# Patient Record
Sex: Female | Born: 1935 | Race: Black or African American | Hispanic: No | Smoking: Former smoker
Health system: Southern US, Community
[De-identification: ages and names within clinical notes are randomized; demographics above are authoritative.]

## PROBLEM LIST (undated history)

## (undated) DIAGNOSIS — N10 Acute tubulo-interstitial nephritis: Secondary | ICD-10-CM

## (undated) DIAGNOSIS — D649 Anemia, unspecified: Secondary | ICD-10-CM

## (undated) DIAGNOSIS — N3281 Overactive bladder: Secondary | ICD-10-CM

## (undated) DIAGNOSIS — K219 Gastro-esophageal reflux disease without esophagitis: Secondary | ICD-10-CM

## (undated) DIAGNOSIS — Z1612 Extended spectrum beta lactamase (ESBL) resistance: Secondary | ICD-10-CM

## (undated) DIAGNOSIS — G8220 Paraplegia, unspecified: Secondary | ICD-10-CM

## (undated) DIAGNOSIS — H40053 Ocular hypertension, bilateral: Secondary | ICD-10-CM

## (undated) DIAGNOSIS — Z96651 Presence of right artificial knee joint: Secondary | ICD-10-CM

## (undated) DIAGNOSIS — A499 Bacterial infection, unspecified: Secondary | ICD-10-CM

## (undated) DIAGNOSIS — M24562 Contracture, left knee: Secondary | ICD-10-CM

## (undated) DIAGNOSIS — E876 Hypokalemia: Secondary | ICD-10-CM

## (undated) DIAGNOSIS — I10 Essential (primary) hypertension: Secondary | ICD-10-CM

## (undated) DIAGNOSIS — R3915 Urgency of urination: Secondary | ICD-10-CM

## (undated) DIAGNOSIS — H409 Unspecified glaucoma: Secondary | ICD-10-CM

## (undated) DIAGNOSIS — R41841 Cognitive communication deficit: Secondary | ICD-10-CM

## (undated) DIAGNOSIS — R293 Abnormal posture: Secondary | ICD-10-CM

## (undated) DIAGNOSIS — Z741 Need for assistance with personal care: Secondary | ICD-10-CM

## (undated) DIAGNOSIS — A4150 Gram-negative sepsis, unspecified: Secondary | ICD-10-CM

## (undated) DIAGNOSIS — E7800 Pure hypercholesterolemia, unspecified: Secondary | ICD-10-CM

## (undated) DIAGNOSIS — F0390 Unspecified dementia without behavioral disturbance: Secondary | ICD-10-CM

## (undated) DIAGNOSIS — Z993 Dependence on wheelchair: Secondary | ICD-10-CM

## (undated) DIAGNOSIS — R262 Difficulty in walking, not elsewhere classified: Secondary | ICD-10-CM

## (undated) DIAGNOSIS — M6281 Muscle weakness (generalized): Secondary | ICD-10-CM

## (undated) DIAGNOSIS — M1711 Unilateral primary osteoarthritis, right knee: Secondary | ICD-10-CM

## (undated) DIAGNOSIS — E119 Type 2 diabetes mellitus without complications: Secondary | ICD-10-CM

## (undated) HISTORY — DX: Cognitive communication deficit: R41.841

## (undated) HISTORY — DX: Difficulty in walking, not elsewhere classified: R26.2

## (undated) HISTORY — DX: Contracture, left knee: M24.562

## (undated) HISTORY — DX: Extended spectrum beta lactamase (ESBL) resistance: Z16.12

## (undated) HISTORY — DX: Ocular hypertension, bilateral: H40.053

## (undated) HISTORY — DX: Unilateral primary osteoarthritis, right knee: M17.11

## (undated) HISTORY — DX: Need for assistance with personal care: Z74.1

## (undated) HISTORY — DX: Gastro-esophageal reflux disease without esophagitis: K21.9

## (undated) HISTORY — PX: ABDOMINAL HYSTERECTOMY: SHX81

## (undated) HISTORY — DX: Overactive bladder: N32.81

## (undated) HISTORY — DX: Dependence on wheelchair: Z99.3

## (undated) HISTORY — DX: Muscle weakness (generalized): M62.81

## (undated) HISTORY — DX: Urgency of urination: R39.15

## (undated) HISTORY — PX: CATARACT EXTRACTION: SUR2

## (undated) HISTORY — DX: Gram-negative sepsis, unspecified: A41.50

## (undated) HISTORY — DX: Acute pyelonephritis: N10

## (undated) HISTORY — DX: Unspecified dementia, unspecified severity, without behavioral disturbance, psychotic disturbance, mood disturbance, and anxiety: F03.90

## (undated) HISTORY — DX: Bacterial infection, unspecified: A49.9

## (undated) HISTORY — DX: Abnormal posture: R29.3

## (undated) MED FILL — Iron Sucrose Inj 20 MG/ML (Fe Equiv): INTRAVENOUS | Qty: 15 | Status: AC

---

## 2001-07-26 ENCOUNTER — Emergency Department (HOSPITAL_COMMUNITY): Admission: EM | Admit: 2001-07-26 | Discharge: 2001-07-26 | Payer: Self-pay | Admitting: Internal Medicine

## 2001-07-26 ENCOUNTER — Encounter: Payer: Self-pay | Admitting: Internal Medicine

## 2001-08-19 ENCOUNTER — Encounter: Payer: Self-pay | Admitting: Preventative Medicine

## 2001-08-19 ENCOUNTER — Ambulatory Visit (HOSPITAL_COMMUNITY): Admission: RE | Admit: 2001-08-19 | Discharge: 2001-08-19 | Payer: Self-pay | Admitting: Preventative Medicine

## 2001-10-17 ENCOUNTER — Ambulatory Visit (HOSPITAL_COMMUNITY): Admission: RE | Admit: 2001-10-17 | Discharge: 2001-10-17 | Payer: Self-pay | Admitting: Preventative Medicine

## 2001-10-17 ENCOUNTER — Encounter: Payer: Self-pay | Admitting: Preventative Medicine

## 2002-04-23 ENCOUNTER — Ambulatory Visit (HOSPITAL_COMMUNITY): Admission: RE | Admit: 2002-04-23 | Discharge: 2002-04-23 | Payer: Self-pay | Admitting: General Surgery

## 2002-04-23 ENCOUNTER — Encounter: Payer: Self-pay | Admitting: General Surgery

## 2002-08-27 ENCOUNTER — Ambulatory Visit (HOSPITAL_COMMUNITY): Admission: RE | Admit: 2002-08-27 | Discharge: 2002-08-27 | Payer: Self-pay | Admitting: General Surgery

## 2002-08-27 ENCOUNTER — Encounter: Payer: Self-pay | Admitting: General Surgery

## 2002-09-03 ENCOUNTER — Ambulatory Visit (HOSPITAL_COMMUNITY): Admission: RE | Admit: 2002-09-03 | Discharge: 2002-09-03 | Payer: Self-pay | Admitting: General Surgery

## 2002-09-03 ENCOUNTER — Encounter: Payer: Self-pay | Admitting: General Surgery

## 2002-09-17 ENCOUNTER — Ambulatory Visit (HOSPITAL_COMMUNITY): Admission: RE | Admit: 2002-09-17 | Discharge: 2002-09-17 | Payer: Self-pay | Admitting: General Surgery

## 2002-09-17 ENCOUNTER — Encounter: Payer: Self-pay | Admitting: General Surgery

## 2003-09-01 ENCOUNTER — Ambulatory Visit (HOSPITAL_COMMUNITY): Admission: RE | Admit: 2003-09-01 | Discharge: 2003-09-01 | Payer: Self-pay | Admitting: General Surgery

## 2003-09-11 ENCOUNTER — Ambulatory Visit (HOSPITAL_COMMUNITY): Admission: RE | Admit: 2003-09-11 | Discharge: 2003-09-11 | Payer: Self-pay | Admitting: General Surgery

## 2005-01-25 ENCOUNTER — Ambulatory Visit (HOSPITAL_COMMUNITY): Admission: RE | Admit: 2005-01-25 | Discharge: 2005-01-25 | Payer: Self-pay | Admitting: General Surgery

## 2005-02-15 ENCOUNTER — Ambulatory Visit (HOSPITAL_COMMUNITY): Admission: RE | Admit: 2005-02-15 | Discharge: 2005-02-15 | Payer: Self-pay | Admitting: General Surgery

## 2005-05-31 ENCOUNTER — Ambulatory Visit (HOSPITAL_COMMUNITY): Admission: RE | Admit: 2005-05-31 | Discharge: 2005-05-31 | Payer: Self-pay | Admitting: General Surgery

## 2006-03-20 ENCOUNTER — Emergency Department (HOSPITAL_COMMUNITY): Admission: EM | Admit: 2006-03-20 | Discharge: 2006-03-20 | Payer: Self-pay | Admitting: Emergency Medicine

## 2006-03-21 ENCOUNTER — Ambulatory Visit (HOSPITAL_COMMUNITY): Admission: RE | Admit: 2006-03-21 | Discharge: 2006-03-21 | Payer: Self-pay | Admitting: General Surgery

## 2007-04-08 ENCOUNTER — Ambulatory Visit (HOSPITAL_COMMUNITY): Admission: RE | Admit: 2007-04-08 | Discharge: 2007-04-08 | Payer: Self-pay | Admitting: General Surgery

## 2008-03-12 ENCOUNTER — Emergency Department (HOSPITAL_COMMUNITY): Admission: EM | Admit: 2008-03-12 | Discharge: 2008-03-12 | Payer: Self-pay | Admitting: Emergency Medicine

## 2008-04-23 ENCOUNTER — Ambulatory Visit (HOSPITAL_COMMUNITY): Admission: RE | Admit: 2008-04-23 | Discharge: 2008-04-23 | Payer: Self-pay | Admitting: General Surgery

## 2009-05-05 ENCOUNTER — Ambulatory Visit (HOSPITAL_COMMUNITY): Admission: RE | Admit: 2009-05-05 | Discharge: 2009-05-05 | Payer: Self-pay | Admitting: General Surgery

## 2009-12-01 ENCOUNTER — Ambulatory Visit (HOSPITAL_COMMUNITY): Admission: RE | Admit: 2009-12-01 | Discharge: 2009-12-01 | Payer: Self-pay | Admitting: Family Medicine

## 2010-05-23 ENCOUNTER — Ambulatory Visit (HOSPITAL_COMMUNITY): Admission: RE | Admit: 2010-05-23 | Discharge: 2010-05-23 | Payer: Self-pay | Admitting: General Surgery

## 2010-08-14 ENCOUNTER — Encounter: Payer: Self-pay | Admitting: General Surgery

## 2010-12-09 NOTE — Op Note (Signed)
   NAME:  ABYGALE, KARPF                          ACCOUNT NO.:  [id number]   MEDICAL RECORD NO.:  [id number]                   PATIENT TYPE:  AMB   LOCATION:  DAY                                  FACILITY:  [facility]   PHYSICIAN:  Barbaraann Barthel, M.D.              DATE OF BIRTH:  03-Jul-1935   DATE OF PROCEDURE:  09/17/2002  DATE OF DISCHARGE:                                 OPERATIVE REPORT   PREOPERATIVE DIAGNOSIS:  Abnormal left mammogram.   PROCEDURE:  Needle localization with left partial mastectomy.   SURGEON:  Barbaraann Barthel, M.D.   SPECIMENS:  Left breast tissue.   CLINICAL NOTE:  This is a 76 year old black female who had been followed  with serial mammography for an oil cyst in her left breast.  This has  remained stable.  Then she was noted to have a new lesion that appeared  suspicious.  Biopsy was recommended.  Needle localization was planned, and  we discussed the complications not limited to but including bleeding,  infection, and the possibility that further surgery may be required.  Informed consent was obtained.   GROSS OPERATIVE FINDINGS:  Nothing abnormal was encountered  intraoperatively.  Specimen mammography revealed that the suspicious lesion  was included in the specimen.  Final pathology is pending.   DESCRIPTION OF PROCEDURE:  The patient's left hemithorax was prepped with  Betadine solution and draped in the usual manner.  An elliptical incision  was carried out around the wire, removing a piece of skin in order not to  dislodge the localizing wire.  We removed this in toto.  The specimen, as  previously discussed with the radiologist, was approximately 5 cm or so  below the skin margin.  We removed a generous portion of tissue around the  wire and this was sent for specimen mammography, which confirmed that we had  removed the lesion.  The wound was then irrigated with normal saline  solution.  The bleeding was controlled with the cautery device.  The  breast  tissue was approximated with 3-0 Polysorb and a cosmetic subcuticular 5-0  Polysorb closure was obtained.  Steri-Strips and Neosporin were further used  with a sterile dressing.  Prior to closure all sponge, needle, and  instrument counts were found to be correct.  Estimated blood loss was  minimal.  The patient received 900 mL of crystalloids intraoperatively.  No  drains were placed.  There were no complications.                                               Barbaraann Barthel, M.D.    WB/MEDQ  D:  09/17/2002  T:  09/17/2002  Job:  [id number]

## 2011-05-09 ENCOUNTER — Other Ambulatory Visit (HOSPITAL_COMMUNITY): Payer: Self-pay | Admitting: General Surgery

## 2011-05-09 DIAGNOSIS — Z139 Encounter for screening, unspecified: Secondary | ICD-10-CM

## 2011-05-26 ENCOUNTER — Ambulatory Visit (HOSPITAL_COMMUNITY): Payer: Medicare HMO

## 2011-05-29 ENCOUNTER — Ambulatory Visit (HOSPITAL_COMMUNITY)
Admission: RE | Admit: 2011-05-29 | Discharge: 2011-05-29 | Disposition: A | Discharge status: Home / Self Care | Payer: Medicare HMO | Source: Ambulatory Visit | Attending: General Surgery | Admitting: General Surgery

## 2011-05-29 DIAGNOSIS — Z139 Encounter for screening, unspecified: Secondary | ICD-10-CM

## 2011-05-29 DIAGNOSIS — Z1231 Encounter for screening mammogram for malignant neoplasm of breast: Secondary | ICD-10-CM | POA: Insufficient documentation

## 2012-04-30 ENCOUNTER — Other Ambulatory Visit (HOSPITAL_COMMUNITY): Payer: Self-pay | Admitting: General Surgery

## 2012-04-30 DIAGNOSIS — Z139 Encounter for screening, unspecified: Secondary | ICD-10-CM

## 2012-05-30 ENCOUNTER — Ambulatory Visit (HOSPITAL_COMMUNITY)
Admission: RE | Admit: 2012-05-30 | Discharge: 2012-05-30 | Disposition: A | Discharge status: Home / Self Care | Payer: Medicare HMO | Source: Ambulatory Visit | Attending: General Surgery | Admitting: General Surgery

## 2012-05-30 ENCOUNTER — Ambulatory Visit (HOSPITAL_COMMUNITY): Payer: Medicare HMO

## 2012-05-30 DIAGNOSIS — Z139 Encounter for screening, unspecified: Secondary | ICD-10-CM

## 2012-05-30 DIAGNOSIS — Z1231 Encounter for screening mammogram for malignant neoplasm of breast: Secondary | ICD-10-CM | POA: Insufficient documentation

## 2012-08-10 ENCOUNTER — Emergency Department (HOSPITAL_COMMUNITY)
Admission: EM | Admit: 2012-08-10 | Discharge: 2012-08-10 | Disposition: A | Discharge status: Home / Self Care | Payer: Medicare HMO | Attending: Emergency Medicine | Admitting: Emergency Medicine

## 2012-08-10 ENCOUNTER — Encounter (HOSPITAL_COMMUNITY): Payer: Self-pay | Admitting: *Deleted

## 2012-08-10 ENCOUNTER — Emergency Department (HOSPITAL_COMMUNITY): Payer: Medicare HMO

## 2012-08-10 DIAGNOSIS — R059 Cough, unspecified: Secondary | ICD-10-CM | POA: Insufficient documentation

## 2012-08-10 DIAGNOSIS — Z862 Personal history of diseases of the blood and blood-forming organs and certain disorders involving the immune mechanism: Secondary | ICD-10-CM | POA: Insufficient documentation

## 2012-08-10 DIAGNOSIS — R05 Cough: Secondary | ICD-10-CM | POA: Insufficient documentation

## 2012-08-10 DIAGNOSIS — E7800 Pure hypercholesterolemia, unspecified: Secondary | ICD-10-CM | POA: Insufficient documentation

## 2012-08-10 DIAGNOSIS — I10 Essential (primary) hypertension: Secondary | ICD-10-CM | POA: Insufficient documentation

## 2012-08-10 DIAGNOSIS — F17200 Nicotine dependence, unspecified, uncomplicated: Secondary | ICD-10-CM | POA: Insufficient documentation

## 2012-08-10 DIAGNOSIS — Z8639 Personal history of other endocrine, nutritional and metabolic disease: Secondary | ICD-10-CM | POA: Insufficient documentation

## 2012-08-10 HISTORY — DX: Essential (primary) hypertension: I10

## 2012-08-10 HISTORY — DX: Pure hypercholesterolemia, unspecified: E78.00

## 2012-08-10 HISTORY — DX: Hypokalemia: E87.6

## 2012-08-10 MED ORDER — HYDROCOD POLST-CHLORPHEN POLST 10-8 MG/5ML PO LQCR: 2.5000 mL | Freq: Two times a day (BID) | ORAL | Status: DC | PRN

## 2012-08-10 MED ORDER — ALBUTEROL SULFATE HFA 108 (90 BASE) MCG/ACT IN AERS: 1.0000 | INHALATION_SPRAY | Freq: Four times a day (QID) | RESPIRATORY_TRACT | Status: DC | PRN

## 2012-08-10 NOTE — ED Notes (Signed)
Pt states cough, productive at times, white in color. Cough x 2 days with decreased appetite.

## 2012-08-10 NOTE — ED Provider Notes (Signed)
History   This chart was scribed for Anna Gaskins, MD by Toya Smothers, ED Scribe. The patient was seen in room [facility]. Patient's care was started at 0903.  CSN: [id number]  Arrival date & time 08/10/12  0903   First MD Initiated Contact with Patient 08/10/12 [phone]      Chief Complaint  Patient presents with  . Cough   Patient is a 78 y.o. female presenting with cough. The history is provided by the EMS personnel and a caregiver. The history is limited by the condition of the patient and the absence of a caregiver. No language interpreter was used.  Cough This is a new problem. The current episode started 2 days ago. The problem has not changed since onset.The cough is non-productive. There has been no fever. Pertinent negatives include no chest pain and no shortness of breath. She has tried nothing for the symptoms. The treatment provided no relief. She is a smoker.   Anna Hanna is a 78 y.o. female who presents to the Emergency Department complaining of 2 days of new, constant, unchanged, moderate productive cough. Typically healthy at baseline  Symptoms have not been treated PTA. No fever, vomiting, bloody cough, abdominal pain, SOB, or leg swelling. Pt denies use of tobacco products, consumption of alcohol, and use of illicit drugs.     Past Medical History  Diagnosis Date  . Hypertension   . Hypercholesteremia   . Hypokalemia     Past Surgical History  Procedure Date  . Cataract extraction   . Abdominal hysterectomy     No family history on file.  History  Substance Use Topics  . Smoking status: Current Some Day Smoker  . Smokeless tobacco: Not on file  . Alcohol Use: No    Review of Systems  Respiratory: Positive for cough. Negative for shortness of breath.   Cardiovascular: Negative for chest pain.  Gastrointestinal: Negative for vomiting.  Neurological: Negative for weakness.  All other systems reviewed and are negative.    Allergies  Review of  patient's allergies indicates no known allergies.  Home Medications   Current Outpatient Rx  Name  Route  Sig  Dispense  Refill  . ALBUTEROL SULFATE HFA 108 (90 BASE) MCG/ACT IN AERS   Inhalation   Inhale 1-2 puffs into the lungs every 6 (six) hours as needed for wheezing.   1 Inhaler   0   . HYDROCOD POLST-CPM POLST ER 10-8 MG/5ML PO LQCR   Oral   Take 2.5 mLs by mouth every 12 (twelve) hours as needed (cough).   115 mL   0     BP 107/71  Pulse 110  Temp 98.7 F (37.1 C) (Oral)  Resp 20  Ht 5\' 1"  (1.549 m)  Wt 208 lb (94.348 kg)  BMI 39.30 kg/m2  SpO2 100%  Physical Exam   CONSTITUTIONAL: Well developed/well nourished HEAD AND FACE: Normocephalic/atraumatic EYES: EOMI/PERRL ENMT: Mucous membranes moist, nasal congestion.  Voice normal NECK: supple no meningeal signs SPINE:entire spine nontender CV: S1/S2 noted, no murmurs/rubs/gallops noted LUNGS: Lungs are clear to auscultation bilaterally, no apparent distress ABDOMEN: soft, nontender, no rebound or guarding GU:no cva tenderness NEURO: Pt is awake/alert, moves all extremitiesx4 EXTREMITIES: pulses normal, full ROM SKIN: warm, color normal PSYCH: no abnormalities of mood noted   ED Course  Procedures DIAGNOSTIC STUDIES: Oxygen Saturation is 100% on room air, normal by my interpretation.    COORDINATION OF CARE: 19:19- Ordered DG Chest 2 View 1 time imaging. 09:35-  Evaluated Pt. Pt is awake, alert, and without distress. 09:38- Patient understand and agree with initial ED impression and plan with expectations set for ED visit. Pt well appearing.  Likely viral process.  No distress noted.  meds given for symptomatic relief.  Advised of sedation with cough meds.  Pt denied h/o glaucoma, however her meds reveal she is on eye drops for glaucoma.  Will call to advise to withold this medicine as can contribute to glaucoma.   Labs Reviewed - No data to display Dg Chest 2 View  08/10/2012  *RADIOLOGY REPORT*   Clinical Data: Cough  CHEST - 2 VIEW  Comparison: 09/01/2003  Findings: Mild hyperinflation. Lateral view degraded by patient arm position.  Mid thoracic spondylosis. Midline trachea.  Normal heart size. Tortuous thoracic aorta. No pleural effusion or pneumothorax.  Mild lower lobe predominant interstitial thickening. Clear lungs.  IMPRESSION: 1.  No acute cardiopulmonary disease. 2.  Mild peribronchial thickening which may relate to chronic bronchitis or smoking.   Original Report Authenticated By: Jeronimo Greaves, M.D.      1. Cough       MDM  Nursing notes including past medical history and social history reviewed and considered in documentation xrays reviewed and considered       I personally performed the services described in this documentation, which was scribed in my presence. The recorded information has been reviewed and is accurate.      Anna Gaskins, MD 08/10/12 [phone]

## 2013-05-03 ENCOUNTER — Other Ambulatory Visit (HOSPITAL_COMMUNITY): Payer: Self-pay | Admitting: General Surgery

## 2013-05-03 DIAGNOSIS — Z0000 Encounter for general adult medical examination without abnormal findings: Secondary | ICD-10-CM

## 2013-05-05 ENCOUNTER — Ambulatory Visit (HOSPITAL_COMMUNITY)
Admission: RE | Admit: 2013-05-05 | Discharge: 2013-05-05 | Disposition: A | Discharge status: Home / Self Care | Payer: Medicare HMO | Source: Ambulatory Visit | Attending: General Surgery | Admitting: General Surgery

## 2013-05-05 DIAGNOSIS — Z0000 Encounter for general adult medical examination without abnormal findings: Secondary | ICD-10-CM

## 2013-05-05 DIAGNOSIS — Z1231 Encounter for screening mammogram for malignant neoplasm of breast: Secondary | ICD-10-CM | POA: Insufficient documentation

## 2013-07-16 ENCOUNTER — Emergency Department (HOSPITAL_COMMUNITY): Payer: Medicare HMO

## 2013-07-16 ENCOUNTER — Emergency Department (HOSPITAL_COMMUNITY)
Admission: EM | Admit: 2013-07-16 | Discharge: 2013-07-16 | Disposition: A | Discharge status: Home / Self Care | Payer: Medicare HMO | Attending: Emergency Medicine | Admitting: Emergency Medicine

## 2013-07-16 ENCOUNTER — Encounter (HOSPITAL_COMMUNITY): Payer: Self-pay | Admitting: Emergency Medicine

## 2013-07-16 DIAGNOSIS — M25551 Pain in right hip: Secondary | ICD-10-CM

## 2013-07-16 DIAGNOSIS — M25561 Pain in right knee: Secondary | ICD-10-CM

## 2013-07-16 DIAGNOSIS — Z862 Personal history of diseases of the blood and blood-forming organs and certain disorders involving the immune mechanism: Secondary | ICD-10-CM | POA: Insufficient documentation

## 2013-07-16 DIAGNOSIS — Z79899 Other long term (current) drug therapy: Secondary | ICD-10-CM | POA: Insufficient documentation

## 2013-07-16 DIAGNOSIS — I10 Essential (primary) hypertension: Secondary | ICD-10-CM | POA: Insufficient documentation

## 2013-07-16 DIAGNOSIS — M25569 Pain in unspecified knee: Secondary | ICD-10-CM | POA: Insufficient documentation

## 2013-07-16 DIAGNOSIS — M25559 Pain in unspecified hip: Secondary | ICD-10-CM | POA: Insufficient documentation

## 2013-07-16 DIAGNOSIS — Z8639 Personal history of other endocrine, nutritional and metabolic disease: Secondary | ICD-10-CM | POA: Insufficient documentation

## 2013-07-16 DIAGNOSIS — F17200 Nicotine dependence, unspecified, uncomplicated: Secondary | ICD-10-CM | POA: Insufficient documentation

## 2013-07-16 MED ORDER — OXYCODONE-ACETAMINOPHEN 5-325 MG PO TABS: 1.0000 | ORAL_TABLET | ORAL | Status: DC | PRN

## 2013-07-16 MED ORDER — ACETAMINOPHEN 500 MG PO TABS
1000.0000 mg | ORAL_TABLET | Freq: Once | ORAL | Status: AC
  Administered 2013-07-16: 1000 mg via ORAL
  Filled 2013-07-16: qty 2

## 2013-07-16 MED ORDER — HYDROMORPHONE HCL PF 1 MG/ML IJ SOLN
0.5000 mg | Freq: Once | INTRAMUSCULAR | Status: AC
  Administered 2013-07-16: 0.5 mg via INTRAMUSCULAR
  Filled 2013-07-16: qty 1

## 2013-07-16 MED ORDER — IBUPROFEN 800 MG PO TABS
800.0000 mg | ORAL_TABLET | Freq: Once | ORAL | Status: AC
  Administered 2013-07-16: 800 mg via ORAL
  Filled 2013-07-16: qty 1

## 2013-07-16 NOTE — ED Notes (Signed)
C/o arthritis pain in right hand yesterday and right leg pain today-pt points to right knee and right groin area

## 2013-07-16 NOTE — ED Notes (Signed)
Dr. Adriana Simas notified temp 100.5

## 2013-07-16 NOTE — ED Provider Notes (Signed)
CSN: [id number]     Arrival date & time 07/16/13  1556 History   First MD Initiated Contact with Patient 07/16/13 1646     Chief Complaint  Patient presents with  . Leg Pain   (Consider location/radiation/quality/duration/timing/severity/associated sxs/prior Treatment) HPI.... complains of pain in right knee and right groin for 24 hours. No fever, sweats, chills, dysuria.   Ambulation makes pain worse.  No known rheumatological disorders. Severity is moderate.  No radiation of pain  Past Medical History  Diagnosis Date  . Hypertension   . Hypercholesteremia   . Hypokalemia    Past Surgical History  Procedure Laterality Date  . Cataract extraction    . Abdominal hysterectomy     History reviewed. No pertinent family history. History  Substance Use Topics  . Smoking status: Current Some Day Smoker    Types: Cigarettes  . Smokeless tobacco: Not on file  . Alcohol Use: No   OB History   Grav Para Term Preterm Abortions TAB SAB Ect Mult Living                 Review of Systems  All other systems reviewed and are negative.    Allergies  Review of patient's allergies indicates no known allergies.  Home Medications   Current Outpatient Rx  Name  Route  Sig  Dispense  Refill  . amLODipine (NORVASC) 5 MG tablet   Oral   Take 1 tablet by mouth Daily.         . brimonidine (ALPHAGAN) 0.15 % ophthalmic solution   Both Eyes   Place 1 drop into both eyes every morning.          . hydrochlorothiazide (MICROZIDE) 12.5 MG capsule   Oral   Take 1 capsule by mouth Daily.         Marland Kitchen LOTEMAX 0.5 % GEL   Both Eyes   Place 1 drop into both eyes every morning.          . potassium chloride SA (K-DUR,KLOR-CON) 20 MEQ tablet   Oral   Take 10 mEq by mouth Daily.          Marland Kitchen latanoprost (XALATAN) 0.005 % ophthalmic solution   Both Eyes   Place 1 drop into both eyes at bedtime.          Marland Kitchen LORazepam (ATIVAN) 1 MG tablet               . oxyCODONE-acetaminophen  (PERCOCET) 5-325 MG per tablet   Oral   Take 1 tablet by mouth every 4 (four) hours as needed.   20 tablet   0   . timolol (TIMOPTIC) 0.5 % ophthalmic solution   Both Eyes   Place 1 drop into both eyes Daily.          BP 153/88  Pulse 126  Temp(Src) 98.8 F (37.1 C) (Oral)  Resp 16  Ht 5\' 4"  (1.626 m)  Wt 218 lb (98.884 kg)  BMI 37.40 kg/m2  SpO2 98% Physical Exam  Nursing note and vitals reviewed. Constitutional: She is oriented to person, place, and time. She appears well-developed and well-nourished.  HENT:  Head: Normocephalic and atraumatic.  Eyes: Conjunctivae and EOM are normal. Pupils are equal, round, and reactive to light.  Neck: Normal range of motion. Neck supple.  Cardiovascular: Normal rate, regular rhythm and normal heart sounds.   Pulmonary/Chest: Effort normal and breath sounds normal.  Abdominal: Soft. Bowel sounds are normal.  Musculoskeletal:  Tender right groin  and anterior right knee. No masses in groin.  Neurological: She is alert and oriented to person, place, and time.  Skin: Skin is warm and dry.  Psychiatric: She has a normal mood and affect. Her behavior is normal.    ED Course  Procedures (including critical care time) Labs Review Labs Reviewed - No data to display Imaging Review Dg Pelvis 1-2 Views  07/16/2013   CLINICAL DATA:  Right leg pain started yesterday  EXAM: PELVIS - 1-2 VIEW  COMPARISON:  05/13/2013 left hip radiographs  FINDINGS: Osseous demineralization.  Mild bilateral narrowing of the hip joints.  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  Mild sclerosis at pubic symphysis.  No acute fracture, dislocation or bone destruction.  Facet degenerative changes lower lumbar spine.  IMPRESSION: Mild hip joint degenerative changes and acetabula protrusio bilaterally.  Osseous demineralization.  Minimal osteitis pubis.   Electronically Signed   By: Ulyses Southward M.D.   On: 07/16/2013 17:47   Dg Knee Complete 4 Views  Right  07/16/2013   CLINICAL DATA:  Right knee pain beginning 1 day ago.  EXAM: RIGHT KNEE - COMPLETE 4+ VIEW  COMPARISON:  Plain films right knee 0 5/0 05/2010.  FINDINGS: No acute bony or joint abnormality is identified. The patient has advanced tricompartmental osteoarthritis, worst in the lateral compartment. Small joint effusion noted.  IMPRESSION: No acute finding. Advanced osteoarthritis, worse lateral compartment.   Electronically Signed   By: Drusilla Kanner M.D.   On: 07/16/2013 17:47    EKG Interpretation   None       MDM   1. Pelvic joint pain, right   2. Right knee pain    X-ray of pelvis and right knee show no fracture.  Patient has primary care followup. Rx Percocet    Donnetta Hutching, MD 07/16/13 [phone]

## 2013-11-26 ENCOUNTER — Telehealth: Payer: Self-pay

## 2013-11-26 NOTE — Telephone Encounter (Signed)
Gastroenterology Pre-Procedure Review  Request Date: 11/26/2013 Requesting Physician: Dr. Malvin JohnsBradford  PATIENT REVIEW QUESTIONS: The patient responded to the following health history questions as indicated:    1. Diabetes Melitis: no 2. Joint replacements in the past 12 months: no 3. Major health problems in the past 3 months: no 4. Has an artificial valve or MVP: no 5. Has a defibrillator: no 6. Has been advised in past to take antibiotics in advance of a procedure like teeth cleaning: no    MEDICATIONS & ALLERGIES:    Patient reports the following regarding taking any blood thinners:   Plavix? no Aspirin? no Coumadin? no  Patient confirms/reports the following medications:  Current Outpatient Prescriptions  Medication Sig Dispense Refill  . amLODipine (NORVASC) 5 MG tablet Take 1 tablet by mouth Daily.      . brimonidine (ALPHAGAN) 0.15 % ophthalmic solution Place 1 drop into both eyes every morning.       . hydrochlorothiazide (MICROZIDE) 12.5 MG capsule Take 1 capsule by mouth Daily.      Marland Kitchen. latanoprost (XALATAN) 0.005 % ophthalmic solution Place 1 drop into both eyes at bedtime.       . potassium chloride SA (K-DUR,KLOR-CON) 20 MEQ tablet Take 20 mEq by mouth Daily.       . timolol (TIMOPTIC) 0.5 % ophthalmic solution Place 1 drop into both eyes Daily.      . vitamin B-12 (CYANOCOBALAMIN) 1000 MCG tablet Take 1,000 mcg by mouth daily.      Marland Kitchen. LORazepam (ATIVAN) 1 MG tablet       . LOTEMAX 0.5 % GEL Place 1 drop into both eyes every morning.       Marland Kitchen. oxyCODONE-acetaminophen (PERCOCET) 5-325 MG per tablet Take 1 tablet by mouth every 4 (four) hours as needed.  20 tablet  0   No current facility-administered medications for this visit.    Patient confirms/reports the following allergies:  No Known Allergies  No orders of the defined types were placed in this encounter.    AUTHORIZATION INFORMATION Primary Insurance:   ID #:   Group #:  Pre-Cert / Auth required:  Pre-Cert /  Auth #:   Secondary Insurance:   ID #:   Group #:  Pre-Cert / Auth required:  Pre-Cert / Auth #:   SCHEDULE INFORMATION: Procedure has been scheduled as follows:  Date: 12/10/2013          Time:  8:30 AM Location: [facility] Short Stay   This Gastroenterology Pre-Precedure Review Form is being routed to the following provider(s): Jonette EvaSandi Fields, MD

## 2013-11-26 NOTE — Telephone Encounter (Signed)
SON HAD MULTIPLE SIMPLE ADENOMAS.  MOVI PREP SPLIT DOSING, FULL LIQUIDS WITH BREAKFAST.  Full Liquid Diet A high-calorie, high-protein supplement should be used to meet your nutritional requirements when the full liquid diet is continued for more than 2 or 3 days. If this diet is to be used for an extended period of time (more than 7 days), a multivitamin should be considered.  Breads and Starches  Allowed: None are allowed except crackers WHOLE OR pureed (made into a thick, smooth soup) in soup.   Avoid: Any others.    Vegetables  Allowed: Strained tomato or vegetable juice. Vegetables pureed in soup.   Avoid: Any others.    Fruit  Allowed: Any strained fruit juices and fruit drinks. Include 1 serving of citrus or vitamin C-enriched fruit juice daily.   Avoid: Any others.  Meat and Meat Substitutes  Allowed: Egg  Avoid: Any meat, fish, or fowl. All cheese.  Milk  Allowed: Milk beverages, including milk shakes and instant breakfast mixes. Smooth yogurt.   Avoid: Any others. Avoid dairy products if not tolerated.    Soups and Combination Foods  Allowed: Broth, strained cream soups. Strained, broth-based soups.   Avoid: Any others.    Desserts and Sweets  Allowed: flavored gelatin, plain ice cream, sherbet, smooth pudding, junket, fruit ices, frozen ice pops, pudding pops,, frozen fudge pops, chocolate syrup. Sugar, honey, jelly, syrup.   Avoid: Any others.  Fats and Oils  Allowed: Margarine, butter, cream, sour cream, oils.   Avoid: Any others.  Beverages  Allowed: All.   Avoid: None.  Condiments  Allowed: Iodized salt, pepper, spices, flavorings. Cocoa powder.   Avoid: Any others.    SAMPLE MEAL PLAN Breakfast   cup orange juice.   MILK SHAKE  1 cup beverage (coffee or tea).   Cream or sugar, if desired.    Midmorning Snack  2 SCRAMBLED OR HARD BOILED EGG   Lunch  1 cup cream soup.    cup fruit juice.   1 cup milk.    cup  custard.   1 cup beverage (coffee or tea).   Cream or sugar, if desired.    Midafternoon Snack  1 cup milk shake.  Dinner  1 cup cream soup.    cup fruit juice.   1 cup milk.    cup pudding.   1 cup beverage (coffee or tea).   Cream or sugar, if desired.  Evening Snack  1 cup supplement.  To increase calories, add sugar, cream, butter, or margarine if possible. Nutritional supplements will also increase the total calories.

## 2013-12-01 ENCOUNTER — Other Ambulatory Visit: Payer: Self-pay

## 2013-12-01 DIAGNOSIS — Z1211 Encounter for screening for malignant neoplasm of colon: Secondary | ICD-10-CM

## 2013-12-01 MED ORDER — PEG-KCL-NACL-NASULF-NA ASC-C 100 G PO SOLR: 1.0000 | ORAL | Status: DC

## 2013-12-01 NOTE — Telephone Encounter (Signed)
Rx sent to the pharmacy and instructions mailed to pt.  

## 2013-12-02 ENCOUNTER — Encounter (HOSPITAL_COMMUNITY): Payer: Self-pay | Admitting: Pharmacy Technician

## 2013-12-04 NOTE — Telephone Encounter (Signed)
Per Leverne Humblesatherine Perry, this PA should be finalized in a day or so.  I informed the call center that I am waiting on this and I will call and let them know when I get it.

## 2013-12-04 NOTE — Telephone Encounter (Signed)
[facility] 12[facility]01/2013 and I have not heard back. Cherly HensenBernadette from [facility] called and I told her that I had sent the request and I would email Leverne Humblesatherine Perry. E-mail to [employer] Perry sent.

## 2013-12-08 NOTE — Telephone Encounter (Signed)
Per Leverne Humblesatherine Perry, Approval number is [id number] and I have informed Bernadett at the Call Center.

## 2013-12-09 NOTE — Telephone Encounter (Signed)
Pt called this morning asking about foods for today ( day of prep).  I told her to get her list of the full liquids and clear liquids and reviewed with her. She is aware to have full liquids before 9:00 Am and the clear liquids after.

## 2013-12-10 ENCOUNTER — Encounter (HOSPITAL_COMMUNITY)
Admission: RE | Disposition: A | Discharge status: Home / Self Care | Payer: Self-pay | Source: Ambulatory Visit | Attending: Gastroenterology

## 2013-12-10 ENCOUNTER — Ambulatory Visit (HOSPITAL_COMMUNITY)
Admission: RE | Admit: 2013-12-10 | Discharge: 2013-12-10 | Disposition: A | Discharge status: Home / Self Care | Payer: Medicare HMO | Source: Ambulatory Visit | Attending: Gastroenterology | Admitting: Gastroenterology

## 2013-12-10 ENCOUNTER — Encounter (HOSPITAL_COMMUNITY): Payer: Self-pay | Admitting: *Deleted

## 2013-12-10 DIAGNOSIS — Q438 Other specified congenital malformations of intestine: Secondary | ICD-10-CM | POA: Insufficient documentation

## 2013-12-10 DIAGNOSIS — Z800 Family history of malignant neoplasm of digestive organs: Secondary | ICD-10-CM | POA: Insufficient documentation

## 2013-12-10 DIAGNOSIS — E7800 Pure hypercholesterolemia, unspecified: Secondary | ICD-10-CM | POA: Insufficient documentation

## 2013-12-10 DIAGNOSIS — Z1211 Encounter for screening for malignant neoplasm of colon: Secondary | ICD-10-CM | POA: Insufficient documentation

## 2013-12-10 DIAGNOSIS — Z79899 Other long term (current) drug therapy: Secondary | ICD-10-CM | POA: Insufficient documentation

## 2013-12-10 DIAGNOSIS — I10 Essential (primary) hypertension: Secondary | ICD-10-CM | POA: Insufficient documentation

## 2013-12-10 DIAGNOSIS — H409 Unspecified glaucoma: Secondary | ICD-10-CM | POA: Insufficient documentation

## 2013-12-10 DIAGNOSIS — E876 Hypokalemia: Secondary | ICD-10-CM | POA: Insufficient documentation

## 2013-12-10 DIAGNOSIS — F17200 Nicotine dependence, unspecified, uncomplicated: Secondary | ICD-10-CM | POA: Insufficient documentation

## 2013-12-10 HISTORY — DX: Unspecified glaucoma: H40.9

## 2013-12-10 HISTORY — PX: COLONOSCOPY: SHX5424

## 2013-12-10 SURGERY — COLONOSCOPY
Anesthesia: Moderate Sedation

## 2013-12-10 MED ORDER — MEPERIDINE HCL 100 MG/ML IJ SOLN
INTRAMUSCULAR | Status: DC | PRN
  Administered 2013-12-10 (×3): 25 mg via INTRAVENOUS

## 2013-12-10 MED ORDER — SODIUM CHLORIDE 0.9 % IV SOLN
INTRAVENOUS | Status: DC
  Administered 2013-12-10: 08:00:00 via INTRAVENOUS

## 2013-12-10 MED ORDER — STERILE WATER FOR IRRIGATION IR SOLN
Status: DC | PRN
  Administered 2013-12-10: 09:00:00

## 2013-12-10 MED ORDER — MIDAZOLAM HCL 5 MG/5ML IJ SOLN
INTRAMUSCULAR | Status: AC
  Filled 2013-12-10: qty 10

## 2013-12-10 MED ORDER — MEPERIDINE HCL 100 MG/ML IJ SOLN
INTRAMUSCULAR | Status: AC
  Filled 2013-12-10: qty 2

## 2013-12-10 MED ORDER — MIDAZOLAM HCL 5 MG/5ML IJ SOLN
INTRAMUSCULAR | Status: DC | PRN
  Administered 2013-12-10 (×3): 1 mg via INTRAVENOUS
  Administered 2013-12-10: 2 mg via INTRAVENOUS

## 2013-12-10 NOTE — Op Note (Signed)
[facility] [address] [place], [id number]   COLONOSCOPY PROCEDURE REPORT  PATIENT: Anna Hanna, Anna J.  MR#: [id number] BIRTHDATE: 1935/01/21 , 79  yrs. old GENDER: Female ENDOSCOPIST: Jonette EvaSandi Teneil Shiller, MD REFERRED XB:JYNWGBY:Steve Sudie BaileyKnowlton, M.D. PROCEDURE DATE:  12/10/2013 PROCEDURE:   Colonoscopy, screening  WITH ENDOCUFF INDICATIONS:Average risk patient for colon cancer. MEDICATIONS: Demerol 75 mg IV and Versed 6 mg IV  DESCRIPTION OF PROCEDURE:    Physical exam was performed.  Informed consent was obtained from the patient after explaining the benefits, risks, and alternatives to procedure.  The patient was connected to monitor and placed in left lateral position. Continuous oxygen was provided by nasal cannula and IV medicine administered through an indwelling cannula.  After administration of sedation and rectal exam, the patients rectum was intubated and the EC-3890Li ([id number]  colonoscope was advanced under direct visualization to the cecum.  The scope was removed slowly by carefully examining the color, texture, anatomy, and integrity mucosa on the way out.  The patient was recovered in endoscopy and discharged home in satisfactory condition.    COLON FINDINGS: The LEFT RECTOSIGMOID colon IS redundant.  The patient was moved on to their back to reach the cecum, The colon mucosa was otherwise normal.  NORMAL RECTUM.  PREP QUALITY: excellent.  CECAL W/D TIME: 11 minutes  COMPLICATIONS: None  ENDOSCOPIC IMPRESSION: 1.   The colon Is redundant 2.   NORMAL RECTUM   RECOMMENDATIONS: HIGH FIBER DIET TCS IN 15 YEARS IF THE BENEFITS OUTWEIGH THE RISKS       _______________________________ eSignedJonette Eva:  Idil Maslanka, MD 12/10/2013 9:24 AM

## 2013-12-10 NOTE — H&P (Signed)
  Primary Care Physician:  Robert Bellow, MD Primary Gastroenterologist:  Dr. Oneida Alar  Pre-Procedure History & Physical: HPI:  Anna Hanna is a 79 y.o. female here for [place].   Past Medical History  Diagnosis Date  . Hypertension   . Hypercholesteremia   . Hypokalemia   . Glaucoma     Past Surgical History  Procedure Laterality Date  . Cataract extraction    . Abdominal hysterectomy      Prior to Admission medications   Medication Sig Start Date End Date Taking? Authorizing Provider  amLODipine (NORVASC) 5 MG tablet Take 1 tablet by mouth Daily. 05/24/12  Yes Historical Provider, MD  brimonidine (ALPHAGAN) 0.15 % ophthalmic solution Place 1 drop into both eyes every morning.  05/07/12  Yes Historical Provider, MD  hydrochlorothiazide (MICROZIDE) 12.5 MG capsule Take 1 capsule by mouth Daily. 05/24/12  Yes Historical Provider, MD  latanoprost (XALATAN) 0.005 % ophthalmic solution Place 1 drop into both eyes at bedtime.  07/14/13  Yes Historical Provider, MD  LOTEMAX 0.5 % GEL Place 1 drop into both eyes every morning.  05/29/12  Yes Historical Provider, MD  peg 3350 powder (MOVIPREP) 100 G SOLR Take 1 kit (200 g total) by mouth as directed. 12/01/13  Yes Danie Binder, MD  potassium chloride SA (K-DUR,KLOR-CON) 20 MEQ tablet Take 20 mEq by mouth Daily.  05/24/12  Yes Historical Provider, MD  timolol (TIMOPTIC) 0.5 % ophthalmic solution Place 1 drop into both eyes Daily. 05/07/12  Yes Historical Provider, MD  vitamin B-12 (CYANOCOBALAMIN) 1000 MCG tablet Take 1,000 mcg by mouth daily.   Yes Historical Provider, MD    Allergies as of 12/01/2013  . (No Known Allergies)    Family History  Problem Relation Age of Onset  . Colon cancer Neg Hx     History   Social History  . Marital Status: Divorced    Spouse Name: N/A    Number of Children: N/A  . Years of Education: N/A   Occupational History  . Not on file.   Social History Main Topics  . Smoking  status: Current Some Day Smoker -- 0.25 packs/day for 3 years    Types: Cigarettes  . Smokeless tobacco: Not on file  . Alcohol Use: No  . Drug Use: No  . Sexual Activity: Not on file   Other Topics Concern  . Not on file   Social History Narrative  . No narrative on file    Review of Systems: See HPI, otherwise negative ROS   Physical Exam: BP 125/70  Pulse 78  Temp(Src) 98.2 F (36.8 C) (Oral)  Resp 18  Ht _0  (1.626 m)  Wt 218 lb (98.884 kg)  BMI 37.40 kg/m2  SpO2 96% General:   Alert,  pleasant and cooperative in NAD Head:  Normocephalic and atraumatic. Neck:  Supple; Lungs:  Clear throughout to auscultation.    Heart:  Regular rate and rhythm. Abdomen:  Soft, nontender and nondistended. Normal bowel sounds, without guarding, and without rebound.   Neurologic:  Alert and  oriented x4;  grossly normal neurologically.  Impression/Plan:     SCREENING  Plan:  1. TCS TODAY

## 2013-12-10 NOTE — Discharge Instructions (Signed)
YOU DID NOT HAVE ANY POLYPS.   FOLLOW A HIGH FIBER DIET. AVOID ITEMS THAT CAUSE BLOATING. SEE INFO BELOW.  Next colonoscopy in 15 years IF THE BENEFITS OUTWEIGH THE RISKS.   Colonoscopy Care After Read the instructions outlined below and refer to this sheet in the next week. These discharge instructions provide you with general information on caring for yourself after you leave the hospital. While your treatment has been planned according to the most current medical practices available, unavoidable complications occasionally occur. If you have any problems or questions after discharge, call DR. Caprice Wasko, [phone].  ACTIVITY  You may resume your regular activity, but move at a slower pace for the next 24 hours.   Take frequent rest periods for the next 24 hours.   Walking will help get rid of the air and reduce the bloated feeling in your belly (abdomen).   No driving for 24 hours (because of the medicine (anesthesia) used during the test).   You may shower.   Do not sign any important legal documents or operate any machinery for 24 hours (because of the anesthesia used during the test).    NUTRITION  Drink plenty of fluids.   You may resume your normal diet as instructed by your doctor.   Begin with a light meal and progress to your normal diet. Heavy or fried foods are harder to digest and may make you feel sick to your stomach (nauseated).   Avoid alcoholic beverages for 24 hours or as instructed.    MEDICATIONS  You may resume your normal medications.   WHAT YOU CAN EXPECT TODAY  Some feelings of bloating in the abdomen.   Passage of more gas than usual.   Spotting of blood in your stool or on the toilet paper  .  IF YOU HAD POLYPS REMOVED DURING THE COLONOSCOPY:  Eat a soft diet IF YOU HAVE NAUSEA, BLOATING, ABDOMINAL PAIN, OR VOMITING.    FINDING OUT THE RESULTS OF YOUR TEST Not all test results are available during your visit. DR. Darrick PennaFIELDS WILL CALL YOU  WITHIN 7 DAYS OF YOUR PROCEDUE WITH YOUR RESULTS. Do not assume everything is normal if you have not heard from DR. Merryn Thaker IN ONE WEEK, CALL HER OFFICE AT [phone].  SEEK IMMEDIATE MEDICAL ATTENTION AND CALL THE OFFICE: [phone] IF:  You have more than a spotting of blood in your stool.   Your belly is swollen (abdominal distention).   You are nauseated or vomiting.   You have a temperature over 101F.   You have abdominal pain or discomfort that is severe or gets worse throughout the day.  High-Fiber Diet A high-fiber diet changes your normal diet to include more whole grains, legumes, fruits, and vegetables. Changes in the diet involve replacing refined carbohydrates with unrefined foods. The calorie level of the diet is essentially unchanged. The Dietary Reference Intake (recommended amount) for adult males is 38 grams per day. For adult females, it is 25 grams per day. Pregnant and lactating women should consume 28 grams of fiber per day. Fiber is the intact part of a plant that is not broken down during digestion. Functional fiber is fiber that has been isolated from the plant to provide a beneficial effect in the body. PURPOSE  Increase stool bulk.   Ease and regulate bowel movements.   Lower cholesterol.  INDICATIONS THAT YOU NEED MORE FIBER  Constipation and hemorrhoids.   Uncomplicated diverticulosis (intestine condition) and irritable bowel syndrome.   Weight management.  As a protective measure against hardening of the arteries (atherosclerosis), diabetes, and cancer.   GUIDELINES FOR INCREASING FIBER IN THE DIET  Start adding fiber to the diet slowly. A gradual increase of about 5 more grams (2 slices of whole-wheat bread, 2 servings of most fruits or vegetables, or 1 bowl of high-fiber cereal) per day is best. Too rapid an increase in fiber may result in constipation, flatulence, and bloating.   Drink enough water and fluids to keep your urine clear or pale  yellow. Water, juice, or caffeine-free drinks are recommended. Not drinking enough fluid may cause constipation.   Eat a variety of high-fiber foods rather than one type of fiber.   Try to increase your intake of fiber through using high-fiber foods rather than fiber pills or supplements that contain small amounts of fiber.   The goal is to change the types of food eaten. Do not supplement your present diet with high-fiber foods, but replace foods in your present diet.  INCLUDE A VARIETY OF FIBER SOURCES  Replace refined and processed grains with whole grains, canned fruits with fresh fruits, and incorporate other fiber sources. White rice, white breads, and most bakery goods contain little or no fiber.   Brown whole-grain rice, buckwheat oats, and many fruits and vegetables are all good sources of fiber. These include: broccoli, Brussels sprouts, cabbage, cauliflower, beets, sweet potatoes, white potatoes (skin on), carrots, tomatoes, eggplant, squash, berries, fresh fruits, and dried fruits.   Cereals appear to be the richest source of fiber. Cereal fiber is found in whole grains and bran. Bran is the fiber-rich outer coat of cereal grain, which is largely removed in refining. In whole-grain cereals, the bran remains. In breakfast cereals, the largest amount of fiber is found in those with "bran" in their names. The fiber content is sometimes indicated on the label.   You may need to include additional fruits and vegetables each day.   In baking, for 1 cup white flour, you may use the following substitutions:   1 cup whole-wheat flour minus 2 tablespoons.   1/2 cup white flour plus 1/2 cup whole-wheat flour.   Hemorrhoids Hemorrhoids are dilated (enlarged) veins around the rectum. Sometimes clots will form in the veins. This makes them swollen and painful. These are called thrombosed hemorrhoids. Causes of hemorrhoids include:  Constipation.   Straining to have a bowel movement.    HEAVY LIFTING HOME CARE INSTRUCTIONS  Eat a well balanced diet and drink 6 to 8 glasses of water every day to avoid constipation. You may also use a bulk laxative.   Avoid straining to have bowel movements.   Keep anal area dry and clean.   Do not use a donut shaped pillow or sit on the toilet for long periods. This increases blood pooling and pain.   Move your bowels when your body has the urge; this will require less straining and will decrease pain and pressure.

## 2013-12-11 ENCOUNTER — Encounter (HOSPITAL_COMMUNITY): Payer: Self-pay | Admitting: Gastroenterology

## 2013-12-11 ENCOUNTER — Telehealth: Payer: Self-pay

## 2013-12-11 NOTE — Telephone Encounter (Signed)
Pt called and said she just wanted us to know that she has not had a BM since colonoscopy yesterday. I told her that is normal, and it might be several days before she has a good BM.  Asked her to call if she has more questions or doesn't have one in the next several days and she said that she would do so.

## 2013-12-16 NOTE — Telephone Encounter (Signed)
REVIEWED. AGREE. 

## 2014-04-20 ENCOUNTER — Other Ambulatory Visit (HOSPITAL_COMMUNITY): Payer: Self-pay | Admitting: General Surgery

## 2014-04-20 DIAGNOSIS — Z1231 Encounter for screening mammogram for malignant neoplasm of breast: Secondary | ICD-10-CM

## 2014-05-07 ENCOUNTER — Ambulatory Visit (HOSPITAL_COMMUNITY)
Admission: RE | Admit: 2014-05-07 | Discharge: 2014-05-07 | Disposition: A | Discharge status: Home / Self Care | Payer: Medicare HMO | Source: Ambulatory Visit | Attending: General Surgery | Admitting: General Surgery

## 2014-05-07 ENCOUNTER — Ambulatory Visit (HOSPITAL_COMMUNITY): Payer: Medicare HMO

## 2014-05-07 DIAGNOSIS — Z1231 Encounter for screening mammogram for malignant neoplasm of breast: Secondary | ICD-10-CM | POA: Diagnosis present

## 2014-11-03 DIAGNOSIS — H10413 Chronic giant papillary conjunctivitis, bilateral: Secondary | ICD-10-CM | POA: Diagnosis not present

## 2014-11-03 DIAGNOSIS — H2512 Age-related nuclear cataract, left eye: Secondary | ICD-10-CM | POA: Diagnosis not present

## 2014-11-03 DIAGNOSIS — Z961 Presence of intraocular lens: Secondary | ICD-10-CM | POA: Diagnosis not present

## 2014-11-03 DIAGNOSIS — H4011X1 Primary open-angle glaucoma, mild stage: Secondary | ICD-10-CM | POA: Diagnosis not present

## 2014-12-07 DIAGNOSIS — E6609 Other obesity due to excess calories: Secondary | ICD-10-CM | POA: Diagnosis not present

## 2014-12-07 DIAGNOSIS — M25561 Pain in right knee: Secondary | ICD-10-CM | POA: Diagnosis not present

## 2014-12-07 DIAGNOSIS — M25562 Pain in left knee: Secondary | ICD-10-CM | POA: Diagnosis not present

## 2014-12-07 DIAGNOSIS — M170 Bilateral primary osteoarthritis of knee: Secondary | ICD-10-CM | POA: Diagnosis not present

## 2015-01-06 DIAGNOSIS — M19011 Primary osteoarthritis, right shoulder: Secondary | ICD-10-CM | POA: Diagnosis not present

## 2015-01-06 DIAGNOSIS — M25511 Pain in right shoulder: Secondary | ICD-10-CM | POA: Diagnosis not present

## 2015-01-06 DIAGNOSIS — R32 Unspecified urinary incontinence: Secondary | ICD-10-CM | POA: Diagnosis not present

## 2015-01-06 DIAGNOSIS — D1721 Benign lipomatous neoplasm of skin and subcutaneous tissue of right arm: Secondary | ICD-10-CM | POA: Diagnosis not present

## 2015-01-27 DIAGNOSIS — M25561 Pain in right knee: Secondary | ICD-10-CM | POA: Diagnosis not present

## 2015-01-27 DIAGNOSIS — I10 Essential (primary) hypertension: Secondary | ICD-10-CM | POA: Diagnosis not present

## 2015-01-27 DIAGNOSIS — R32 Unspecified urinary incontinence: Secondary | ICD-10-CM | POA: Diagnosis not present

## 2015-01-27 DIAGNOSIS — N390 Urinary tract infection, site not specified: Secondary | ICD-10-CM | POA: Diagnosis not present

## 2015-01-27 DIAGNOSIS — E6609 Other obesity due to excess calories: Secondary | ICD-10-CM | POA: Diagnosis not present

## 2015-01-28 DIAGNOSIS — E6609 Other obesity due to excess calories: Secondary | ICD-10-CM | POA: Diagnosis not present

## 2015-01-28 DIAGNOSIS — I10 Essential (primary) hypertension: Secondary | ICD-10-CM | POA: Diagnosis not present

## 2015-05-05 DIAGNOSIS — Z961 Presence of intraocular lens: Secondary | ICD-10-CM | POA: Diagnosis not present

## 2015-05-05 DIAGNOSIS — H401131 Primary open-angle glaucoma, bilateral, mild stage: Secondary | ICD-10-CM | POA: Diagnosis not present

## 2015-05-05 DIAGNOSIS — H04123 Dry eye syndrome of bilateral lacrimal glands: Secondary | ICD-10-CM | POA: Diagnosis not present

## 2015-05-05 DIAGNOSIS — H10413 Chronic giant papillary conjunctivitis, bilateral: Secondary | ICD-10-CM | POA: Diagnosis not present

## 2015-05-05 DIAGNOSIS — H2512 Age-related nuclear cataract, left eye: Secondary | ICD-10-CM | POA: Diagnosis not present

## 2015-06-29 ENCOUNTER — Other Ambulatory Visit (HOSPITAL_COMMUNITY): Payer: Self-pay | Admitting: Family Medicine

## 2015-06-29 DIAGNOSIS — Z1231 Encounter for screening mammogram for malignant neoplasm of breast: Secondary | ICD-10-CM

## 2015-07-01 ENCOUNTER — Ambulatory Visit (HOSPITAL_COMMUNITY)
Admission: RE | Admit: 2015-07-01 | Discharge: 2015-07-01 | Disposition: A | Discharge status: Home / Self Care | Payer: Commercial Managed Care - HMO | Source: Ambulatory Visit | Attending: Family Medicine | Admitting: Family Medicine

## 2015-07-01 DIAGNOSIS — Z1231 Encounter for screening mammogram for malignant neoplasm of breast: Secondary | ICD-10-CM

## 2015-08-09 DIAGNOSIS — J189 Pneumonia, unspecified organism: Secondary | ICD-10-CM | POA: Diagnosis not present

## 2015-08-10 DIAGNOSIS — J189 Pneumonia, unspecified organism: Secondary | ICD-10-CM | POA: Diagnosis not present

## 2015-08-24 DIAGNOSIS — M25561 Pain in right knee: Secondary | ICD-10-CM | POA: Diagnosis not present

## 2015-08-24 DIAGNOSIS — J189 Pneumonia, unspecified organism: Secondary | ICD-10-CM | POA: Diagnosis not present

## 2015-09-01 DIAGNOSIS — J189 Pneumonia, unspecified organism: Secondary | ICD-10-CM | POA: Diagnosis not present

## 2015-09-01 DIAGNOSIS — M170 Bilateral primary osteoarthritis of knee: Secondary | ICD-10-CM | POA: Diagnosis not present

## 2015-09-01 DIAGNOSIS — M25561 Pain in right knee: Secondary | ICD-10-CM | POA: Diagnosis not present

## 2016-02-02 ENCOUNTER — Ambulatory Visit (INDEPENDENT_AMBULATORY_CARE_PROVIDER_SITE_OTHER): Payer: Medicare HMO | Admitting: Orthopaedic Surgery

## 2016-02-02 ENCOUNTER — Ambulatory Visit (INDEPENDENT_AMBULATORY_CARE_PROVIDER_SITE_OTHER): Payer: Medicare HMO

## 2016-02-02 ENCOUNTER — Encounter: Payer: Self-pay | Admitting: Orthopaedic Surgery

## 2016-02-02 VITALS — BP 128/72 | HR 91 | Temp 97.2°F | Ht 62.0 in | Wt 209.0 lb

## 2016-02-02 DIAGNOSIS — M25562 Pain in left knee: Secondary | ICD-10-CM

## 2016-02-02 NOTE — Progress Notes (Signed)
Subjective: My right knee hurts    Patient ID: Anna Hanna, female    DOB: [birth year], 81 y.o.   MRN: [id number]  HPI She gives multiple year history of right knee pain that is getting worse and worse over time.  She has popping and swelling.  She has no new trauma, no paresthesias.  She has no locking or giving way.  She has been told about getting a total knee but she adamantly refuses to consider that.  She has been seen by Dr. Sudie Bailey. I have copies of his latest notes which I reviewed.  She has used ice, rest, heat with some help as well as rubs.  She has heard about injections and would like to try that.  She has hypertension well controlled.  She watches her diet.  She has eye problems and glaucoma.  Review of Systems  HENT: Negative for congestion.   Respiratory: Negative for cough and shortness of breath.   Cardiovascular: Negative for chest pain and leg swelling.  Endocrine: Positive for cold intolerance.  Musculoskeletal: Positive for joint swelling, arthralgias and gait problem.  Allergic/Immunologic: Positive for environmental allergies.   Past Medical History  Diagnosis Date  . Hypertension   . Hypercholesteremia   . Hypokalemia   . Glaucoma     Past Surgical History  Procedure Laterality Date  . Cataract extraction    . Abdominal hysterectomy    . Colonoscopy N/A 12/10/2013    Procedure: COLONOSCOPY;  Surgeon: West Bali, MD;  Location: [facility];  Service: Endoscopy;  Laterality: N/A;  8:30 AM    Current Outpatient Prescriptions on File Prior to Visit  Medication Sig Dispense Refill  . amLODipine (NORVASC) 5 MG tablet Take 1 tablet by mouth Daily.    . hydrochlorothiazide (MICROZIDE) 12.5 MG capsule Take 1 capsule by mouth Daily.    . potassium chloride SA (K-DUR,KLOR-CON) 20 MEQ tablet Take 20 mEq by mouth Daily.     . timolol (TIMOPTIC) 0.5 % ophthalmic solution Place 1 drop into both eyes Daily.    . vitamin B-12 (CYANOCOBALAMIN) 1000  MCG tablet Take 1,000 mcg by mouth daily.     No current facility-administered medications on file prior to visit.    Social History   Social History  . Marital Status: Divorced    Spouse Name: N/A  . Number of Children: N/A  . Years of Education: N/A   Occupational History  . Not on file.   Social History Main Topics  . Smoking status: Current Some Day Smoker -- 0.25 packs/day for 3 years    Types: Cigarettes  . Smokeless tobacco: Not on file  . Alcohol Use: No  . Drug Use: No  . Sexual Activity: Not on file   Other Topics Concern  . Not on file   Social History Narrative    Family History  Problem Relation Age of Onset  . Colon cancer Neg Hx   . Cancer Mother   . Arthritis Mother   . Hypotension Father   . Cancer Sister   . Hypotension Sister     BP 128/72 mmHg  Pulse 91  Temp(Src) 97.2 F (36.2 C)  Ht  (1.575 m)  Wt 209 lb (94.802 kg)  BMI 38.22 kg/m2     Objective:   Physical Exam  Constitutional: She is oriented to person, place, and time. She appears well-developed and well-nourished.  HENT:  Head: Normocephalic and atraumatic.  Eyes: Conjunctivae and EOM are normal. Pupils  are equal, round, and reactive to light.  Neck: Normal range of motion. Neck supple.  Cardiovascular: Normal rate, regular rhythm and intact distal pulses.   Pulmonary/Chest: Effort normal.  Abdominal: Soft.  Musculoskeletal: She exhibits tenderness (Pain right knee, ROM 0 to 100 with crepitus, effusion 1+ present, gait limp to the right.  Left knee negative.  She has no distal edema.  NV intact.).  Neurological: She is alert and oriented to person, place, and time. She displays normal reflexes. No cranial nerve deficit. She exhibits normal muscle tone. Coordination normal.  Skin: Skin is warm and dry.  Psychiatric: She has a normal mood and affect. Her behavior is normal. Judgment and thought content normal.   X-rays were done, reported separately.  PROCEDURE  NOTE:  The patient requests injections of the right knee , verbal consent was obtained.  The right knee was prepped appropriately after time out was performed.   Sterile technique was observed and injection of 1 cc of Depo-Medrol 40 mg with several cc's of plain xylocaine. Anesthesia was provided by ethyl chloride and a 20-gauge needle was used to inject the knee area. The injection was tolerated well.  A band aid dressing was applied.  The patient was advised to apply ice later today and tomorrow to the injection sight as needed.       Assessment & Plan:   Encounter Diagnosis  Name Primary?  . Knee pain, left Yes   She may be candidate for Synvisc.  I have told her about this.  I want to see how the injection today did.  I will see her back in three weeks.  Call if any problem.  Precautions discussed.  Electronically Signed Darreld McleanWayne Pegge Cumberledge, MD 7/12/201710:22 AM

## 2016-02-23 ENCOUNTER — Encounter: Payer: Self-pay | Admitting: Orthopaedic Surgery

## 2016-02-23 ENCOUNTER — Ambulatory Visit (INDEPENDENT_AMBULATORY_CARE_PROVIDER_SITE_OTHER): Payer: Medicare HMO | Admitting: Orthopaedic Surgery

## 2016-02-23 VITALS — BP 115/60 | HR 85 | Temp 97.5°F | Resp 18 | Ht 62.0 in | Wt 209.0 lb

## 2016-02-23 DIAGNOSIS — M25562 Pain in left knee: Secondary | ICD-10-CM

## 2016-02-23 NOTE — Progress Notes (Signed)
Patient Anna Hanna, female DOB:[birth year], 81 y.o. VOP:[id number]  Chief Complaint  Patient presents with  . Follow-up    Follow up on right knee.    HPI  Anna Hanna is a 81 y.o. female who has right knee pain.  The injection last time helped a lot. She has less pain and less swelling.  She has given thought to our discussion last time about having a total knee replacement. She has talked to friends and other people that have had it done.  She has prayed about it.  She would now like to have a total knee.  We talked about it for a while today.  I went over the risks and imponderables.  I will have her see Dr. Romeo Apple.  She is agreeable to this.   HPI  Body mass index is 38.23 kg/m.  ROS  Review of Systems  HENT: Negative for congestion.   Respiratory: Negative for cough and shortness of breath.   Cardiovascular: Negative for chest pain and leg swelling.  Endocrine: Positive for cold intolerance.  Musculoskeletal: Positive for arthralgias, gait problem and joint swelling.  Allergic/Immunologic: Positive for environmental allergies.    Past Medical History:  Diagnosis Date  . Glaucoma   . Hypercholesteremia   . Hypertension   . Hypokalemia     Past Surgical History:  Procedure Laterality Date  . ABDOMINAL HYSTERECTOMY    . CATARACT EXTRACTION    . COLONOSCOPY N/A 12/10/2013   Procedure: COLONOSCOPY;  Surgeon: West Bali, MD;  Location: [facility];  Service: Endoscopy;  Laterality: N/A;  8:30 AM    Family History  Problem Relation Age of Onset  . Cancer Mother   . Arthritis Mother   . Hypotension Father   . Cancer Sister   . Hypotension Sister   . Colon cancer Neg Hx     Social History Social History  Substance Use Topics  . Smoking status: Current Some Day Smoker    Packs/day: 0.25    Years: 3.00    Types: Cigarettes  . Smokeless tobacco: Never Used  . Alcohol use No    No Known Allergies  Current Outpatient Prescriptions   Medication Sig Dispense Refill  . latanoprost (XALATAN) 0.005 % ophthalmic solution Place 1 drop into both eyes at bedtime.    Marland Kitchen albuterol (PROVENTIL HFA;VENTOLIN HFA) 108 (90 Base) MCG/ACT inhaler Inhale into the lungs every 6 (six) hours as needed for wheezing or shortness of breath.    Marland Kitchen amLODipine (NORVASC) 5 MG tablet Take 1 tablet by mouth Daily.    . hydrochlorothiazide (MICROZIDE) 12.5 MG capsule Take 1 capsule by mouth Daily.    . potassium chloride SA (K-DUR,KLOR-CON) 20 MEQ tablet Take 20 mEq by mouth Daily.     . timolol (TIMOPTIC) 0.5 % ophthalmic solution Place 1 drop into both eyes Daily.    . vitamin B-12 (CYANOCOBALAMIN) 1000 MCG tablet Take 1,000 mcg by mouth daily.     No current facility-administered medications for this visit.      Physical Exam  Blood pressure 115/60, pulse 85, temperature 97.5 F (36.4 C), resp. rate 18, height 5\' 2"  (1.575 m), weight 209 lb (94.8 kg).  Constitutional: overall normal hygiene, normal nutrition, well developed, normal grooming, normal body habitus. Assistive device:none  Musculoskeletal: gait and station Limp right, muscle tone and strength are normal, no tremors or atrophy is present.  .  Neurological: coordination overall normal.  Deep tendon reflex/nerve stretch intact.  Sensation normal.  Cranial  nerves II-XII intact.   Skin:   normal overall no scars, lesions, ulcers or rashes. No psoriasis.  Psychiatric: Alert and oriented x 3.  Recent memory intact, remote memory unclear.  Normal mood and affect. Well groomed.  Good eye contact.  Cardiovascular: overall no swelling, no varicosities, no edema bilaterally, normal temperatures of the legs and arms, no clubbing, cyanosis and good capillary refill.  Lymphatic: palpation is normal.  The right lower extremity is examined:  Inspection:  Thigh:  Non-tender and no defects  Knee has swelling 1+ effusion.                        Joint tenderness is present                         Patient is tender over the medial joint line  Lower Leg:  Has normal appearance and no tenderness or defects  Ankle:  Non-tender and no defects  Foot:  Non-tender and no defects Range of Motion:  Knee:  Range of motion is: 0-105                        Crepitus is  present  Ankle:  Range of motion is normal. Strength and Tone:  The right lower extremity has normal strength and tone. Stability:  Knee:  The knee is stable.  Ankle:  The ankle is stable.    The patient has been educated about the nature of the problem(s) and counseled on treatment options.  The patient appeared to understand what I have discussed and is in agreement with it.  Encounter Diagnosis  Name Primary?  . Knee pain, left Yes    PLAN Call if any problems.  Precautions discussed.  Continue current medications.   Return to clinic see Dr. Romeo Apple for discussion about a total knee on the right.   Electronically Signed Darreld Mclean, MD 8/2/201710:50 AM

## 2016-02-23 NOTE — Patient Instructions (Signed)
To discuss total knee replacement with Dr. Romeo Apple.

## 2016-02-29 ENCOUNTER — Telehealth: Payer: Self-pay

## 2016-02-29 NOTE — Telephone Encounter (Signed)
Aug 15th 0930

## 2016-03-07 ENCOUNTER — Encounter: Payer: Self-pay | Admitting: Orthopedic Surgery

## 2016-03-07 ENCOUNTER — Telehealth: Payer: Self-pay | Admitting: Radiology

## 2016-03-07 ENCOUNTER — Ambulatory Visit (INDEPENDENT_AMBULATORY_CARE_PROVIDER_SITE_OTHER): Payer: Medicare HMO | Admitting: Orthopedic Surgery

## 2016-03-07 VITALS — BP 131/68 | Ht 63.0 in | Wt 210.0 lb

## 2016-03-07 DIAGNOSIS — M25562 Pain in left knee: Secondary | ICD-10-CM | POA: Diagnosis not present

## 2016-03-07 DIAGNOSIS — M1711 Unilateral primary osteoarthritis, right knee: Secondary | ICD-10-CM | POA: Diagnosis not present

## 2016-03-07 NOTE — Progress Notes (Signed)
Chief Complaint  Patient presents with  . Knee Pain    DISCUSS KNEE REPLACEMENT, REFERRED BY DR Hilda LiasKEELING   HPI   81 year old female presents at the request of Dr. Hilda LiasKeeling for possible right total knee arthroplasty. The patient has had pain in her knee for over 5 years and the pain has become severe and it interferes with her activities of daily living including stairclimbing getting out of a chair and normal activity. She has a dull throbbing aching pain in the lateral compartment of the right knee and she does not have good motion or flexion in the knee. The pain is constant worsening worse with activity and unrelieved by oral anti-inflammatory medication or injection.  Review of Systems  Constitutional: Negative for chills, fever, malaise/fatigue and weight loss.  Respiratory: Negative for shortness of breath.   Cardiovascular: Negative for chest pain.  Skin: Negative.     Past Medical History:  Diagnosis Date  . Glaucoma   . Hypercholesteremia   . Hypertension   . Hypokalemia     Past Surgical History:  Procedure Laterality Date  . ABDOMINAL HYSTERECTOMY    . CATARACT EXTRACTION    . COLONOSCOPY N/A 12/10/2013   Procedure: COLONOSCOPY;  Surgeon: West BaliSandi L Fields, MD;  Location: [facility];  Service: Endoscopy;  Laterality: N/A;  8:30 AM   Family History  Problem Relation Age of Onset  . Cancer Mother   . Arthritis Mother   . Hypotension Father   . Cancer Sister   . Hypotension Sister   . Colon cancer Neg Hx    Social History  Substance Use Topics  . Smoking status: Current Some Day Smoker    Packs/day: 0.25    Years: 3.00    Types: Cigarettes  . Smokeless tobacco: Never Used  . Alcohol use No    Current Outpatient Prescriptions:  .  albuterol (PROVENTIL HFA;VENTOLIN HFA) 108 (90 Base) MCG/ACT inhaler, Inhale into the lungs every 6 (six) hours as needed for wheezing or shortness of breath., Disp: , Rfl:  .  amLODipine (NORVASC) 5 MG tablet, Take 1 tablet by mouth  Daily., Disp: , Rfl:  .  hydrochlorothiazide (MICROZIDE) 12.5 MG capsule, Take 1 capsule by mouth Daily., Disp: , Rfl:  .  latanoprost (XALATAN) 0.005 % ophthalmic solution, Place 1 drop into both eyes at bedtime., Disp: , Rfl:  .  potassium chloride SA (K-DUR,KLOR-CON) 20 MEQ tablet, Take 20 mEq by mouth Daily. , Disp: , Rfl:  .  timolol (TIMOPTIC) 0.5 % ophthalmic solution, Place 1 drop into both eyes Daily., Disp: , Rfl:  .  vitamin B-12 (CYANOCOBALAMIN) 1000 MCG tablet, Take 1,000 mcg by mouth daily., Disp: , Rfl:   BP 131/68   Ht 5\' 3"  (1.6 m)   Wt 210 lb (95.3 kg)   BMI 37.20 kg/m   Physical Exam  Constitutional: She is oriented to person, place, and time. She appears well-developed and well-nourished. No distress.  Cardiovascular: Normal rate and intact distal pulses.   Neurological: She is alert and oriented to person, place, and time. She has normal reflexes. She exhibits normal muscle tone. Coordination normal.  Skin: Skin is warm and dry. No rash noted. She is not diaphoretic. No erythema. No pallor.  Psychiatric: She has a normal mood and affect. Her behavior is normal. Judgment and thought content normal.    Ortho Exam Right knee is in valgus alignment there is crepitance on range of motion and painful range of motion is noted throughout the  entire arc of motion from 0-90. She has full extension but the knee does not quite meet 90 of flexion. There is no laxity in the coronal or sagittal plane. Muscle strength and tone are normal. The surgical site skin is normal without nodularity rash lesion or ulceration. She has good distal pulses without edema and normal sensation in the leg and lower leg area.  Left knee currently is asymptomatic flexion arc is 110 strength is normal skin is intact there is no effusion and the neurovascular exam is normal  ASSESSMENT: My personal interpretation of the images:  Plain films show valgus osteoarthritis with narrowing of the lateral  compartment, spur/osteophyte formation subchondral sclerosis and cyst formation  Diagnosis primary osteoarthritis of the right knee  PLAN Right total knee  This procedure has been fully reviewed with the patient and written informed consent has been obtained.   Fuller CanadaStanley Lazaria Schaben, MD 03/07/2016 9:46 AM

## 2016-03-07 NOTE — Patient Instructions (Addendum)
SURGERY SEPT 7   You have decided to proceed with knee replacement surgery. You have decided not to continue with nonoperative measures such as but not limited to oral medication, weight loss, activity modification, physical therapy, bracing, or injection.  We will perform the procedure commonly known as total knee replacement. Some of the risks associated with knee replacement surgery include but are not limited to Bleeding Infection Swelling Stiffness Blood clot Pain that persists even after surgery  Infection is especially devastating complication of knee surgery although rare. If infection does occur your implant will usually have to be removed and several surgeries and antibiotics will be needed to eradicate the infection prior to performing a repeat replacement.   If you're not comfortable with these risks and would like to continue with nonoperative treatment please let Dr. Romeo AppleHarrison know prior to your surgery.    Total Knee Replacement Total knee replacement is a procedure to replace your knee joint with an artificial knee joint (prosthetic knee joint). The purpose of this surgery is to reduce pain and improve your knee function. LET [facility]ARE PROVIDER KNOW ABOUT:   Any allergies you have.  All medicines you are taking, including vitamins, herbs, eye drops, creams, and over-the-counter medicines.  Previous problems you or members of your family have had with the use of anesthetics.  Any blood disorders you have.  Previous surgeries you have had.  Medical conditions you have. RISKS AND COMPLICATIONS  Generally, total knee replacement is a safe procedure. However, problems can occur, including:  Loss of range of motion of the knee or instability of the knee.  Loosening of the prosthesis.  Infection.  Persistent pain. BEFORE THE PROCEDURE   Plan to have someone take you home after the procedure.  Do not eat or drink anything after midnight on the night  before the procedure or as directed by your health care provider.  Ask your health care provider about:  Changing or stopping your regular medicines. This is especially important if you are taking diabetes medicines or blood thinners.  Taking medicines such as aspirin and ibuprofen. These medicines can thin your blood. Do not take these medicines before your procedure if your health care provider asks you not to.  Ask your health care provider about how your surgical site will be marked or identified.  You may be given antibiotic medicines to help prevent infection. PROCEDURE   To reduce your risk of infection:  Your health care team will wash or sanitize their hands.  Your skin will be washed with soap.  An IV tube will be inserted into one of your veins. You will be given one or more of the following:  A medicine that makes you drowsy (sedative).  A medicine that makes you fall asleep (general anesthetic).  A medicine injected into your spine that numbs your body below the waist (spinal anesthetic).  A medicine to block feeling in your leg (nerve block) to help ease pain after surgery.  An incision will be made in your knee. Your surgeon will take out any damaged cartilage and bone by sawing off the damaged surfaces.  The surgeon will then put a new metal liner over the sawed-off portion of your thigh bone (femur) and a plastic liner over the sawed-off portion of one of the bones of your lower leg (tibia). This is to restore alignment and function to your knee. A plastic piece is often used to restore the surface of your knee cap. AFTER THE  PROCEDURE   You will stay in a recovery area until your medicines wear off.  You may have drainage tubes to drain excess fluid from your knee. These tubes attach to a device that removes these fluids.  Once you are awake, stable, and taking fluids well, you will be taken to your hospital room.  You may be directed to take actions to help  prevent blood clots. These may include:  Walking shortly after surgery, with someone assisting you. Moving around after surgery helps to improve blood flow.  Wearing compression stockings or using different types of devices.  You will receive physical therapy as prescribed by your health care provider.   This information is not intended to replace advice given to you by your health care provider. Make sure you discuss any questions you have with your health care provider.   Document Released: 10/16/2000 Document Revised: 03/31/2015 Document Reviewed: 08/20/2011 Elsevier Interactive Patient Education [employer]2016 Elsevier Inc.

## 2016-03-08 ENCOUNTER — Other Ambulatory Visit: Payer: Self-pay | Admitting: *Deleted

## 2016-03-24 NOTE — Patient Instructions (Signed)
Anna Hanna  03/24/2016     @PREFPERIOPPHARMACY @   Your procedure is scheduled on 03/30/2016.  [facility]  Call this number if you have problems the morning of surgery:  [phone]   Remember:  Do not eat food or drink liquids after midnight.  Take these medicines the morning of surgery with A SIP OF WATER Amlodipine, Inhaler and bring with you   Do not wear jewelry, make-up or nail polish.  Do not wear lotions, powders, or perfumes, or deoderant.  Do not shave 48 hours prior to surgery.  Men may shave face and neck.  Do not bring valuables to the hospital.  [facility] belongings or valuables.  Contacts, dentures or bridgework may not be worn into surgery.  Leave your suitcase in the car.  After surgery it may be brought to your room.  For patients admitted to the hospital, discharge time will be determined by your treatment team.  Patients discharged the day of surgery will not be allowed to drive home.    Please read over the following fact sheets that you were given. Surgical Site Infection Prevention and Anesthesia Post-op Instructions     PATIENT INSTRUCTIONS POST-ANESTHESIA  IMMEDIATELY FOLLOWING SURGERY:  Do not drive or operate machinery for the first twenty four hours after surgery.  Do not make any important decisions for twenty four hours after surgery or while taking narcotic pain medications or sedatives.  If you develop intractable nausea and vomiting or a severe headache please notify your doctor immediately.  FOLLOW-UP:  Please make an appointment with your surgeon as instructed. You do not need to follow up with anesthesia unless specifically instructed to do so.  WOUND CARE INSTRUCTIONS (if applicable):  Keep a dry clean dressing on the anesthesia/puncture wound site if there is drainage.  Once the wound has quit draining you may leave it open to air.  Generally you should leave the bandage intact for  twenty four hours unless there is drainage.  If the epidural site drains for more than 36-48 hours please call the anesthesia department.  QUESTIONS?:  Please feel free to call your physician or the hospital operator if you have any questions, and they will be happy to assist you.      Total Knee Replacement Total knee replacement is a procedure to replace your knee joint with an artificial knee joint (prosthetic knee joint). The purpose of this surgery is to reduce pain and improve your knee function. LET [facility] KNOW ABOUT:   Any allergies you have.  All medicines you are taking, including vitamins, herbs, eye drops, creams, and over-the-counter medicines.  Previous problems you or members of your family have had with the use of anesthetics.  Any blood disorders you have.  Previous surgeries you have had.  Medical conditions you have. RISKS AND COMPLICATIONS  Generally, total knee replacement is a safe procedure. However, problems can occur, including:  Loss of range of motion of the knee or instability of the knee.  Loosening of the prosthesis.  Infection.  Persistent pain. BEFORE THE PROCEDURE   Plan to have someone take you home after the procedure.  Do not eat or drink anything after midnight on the night before the procedure or as directed by your health care provider.  Ask your health care provider about:  Changing or stopping your regular medicines. This is especially important if you are taking diabetes medicines  or blood thinners.  Taking medicines such as aspirin and ibuprofen. These medicines can thin your blood. Do not take these medicines before your procedure if your health care provider asks you not to.  Ask your health care provider about how your surgical site will be marked or identified.  You may be given antibiotic medicines to help prevent infection. PROCEDURE   To reduce your risk of infection:  Your health care team will wash  or sanitize their hands.  Your skin will be washed with soap.  An IV tube will be inserted into one of your veins. You will be given one or more of the following:  A medicine that makes you drowsy (sedative).  A medicine that makes you fall asleep (general anesthetic).  A medicine injected into your spine that numbs your body below the waist (spinal anesthetic).  A medicine to block feeling in your leg (nerve block) to help ease pain after surgery.  An incision will be made in your knee. Your surgeon will take out any damaged cartilage and bone by sawing off the damaged surfaces.  The surgeon will then put a new metal liner over the sawed-off portion of your thigh bone (femur) and a plastic liner over the sawed-off portion of one of the bones of your lower leg (tibia). This is to restore alignment and function to your knee. A plastic piece is often used to restore the surface of your knee cap. AFTER THE PROCEDURE   You will stay in a recovery area until your medicines wear off.  You may have drainage tubes to drain excess fluid from your knee. These tubes attach to a device that removes these fluids.  Once you are awake, stable, and taking fluids well, you will be taken to your hospital room.  You may be directed to take actions to help prevent blood clots. These may include:  Walking shortly after surgery, with someone assisting you. Moving around after surgery helps to improve blood flow.  Wearing compression stockings or using different types of devices.  You will receive physical therapy as prescribed by your health care provider.   This information is not intended to replace advice given to you by your health care provider. Make sure you discuss any questions you have with your health care provider.   Document Released: 10/16/2000 Document Revised: 03/31/2015 Document Reviewed: 08/20/2011 Elsevier Interactive Patient Education [employer] Elsevier Inc.

## 2016-03-28 ENCOUNTER — Encounter (HOSPITAL_COMMUNITY)
Admission: RE | Admit: 2016-03-28 | Discharge: 2016-03-28 | Disposition: A | Discharge status: Home / Self Care | Payer: Medicare HMO | Source: Ambulatory Visit | Attending: Orthopedic Surgery | Admitting: Orthopedic Surgery

## 2016-03-28 ENCOUNTER — Other Ambulatory Visit: Payer: Self-pay

## 2016-03-28 ENCOUNTER — Encounter (HOSPITAL_COMMUNITY): Payer: Self-pay

## 2016-03-28 ENCOUNTER — Other Ambulatory Visit (HOSPITAL_COMMUNITY): Payer: Medicare HMO

## 2016-03-28 LAB — BASIC METABOLIC PANEL
Anion gap: 12 (ref 5–15)
BUN: 15 mg/dL (ref 6–20)
CHLORIDE: 102 mmol/L (ref 101–111)
CO2: 26 mmol/L (ref 22–32)
Calcium: 9.1 mg/dL (ref 8.9–10.3)
Creatinine, Ser: 0.61 mg/dL (ref 0.44–1.00)
GFR calc Af Amer: >60 mL/min (ref >60)
GLUCOSE: 107 mg/dL — AB (ref 65–99)
POTASSIUM: 3.7 mmol/L (ref 3.5–5.1)
Sodium: 140 mmol/L (ref 135–145)

## 2016-03-28 LAB — SURGICAL PCR SCREEN
MRSA, PCR: NEGATIVE
Staphylococcus aureus: NEGATIVE

## 2016-03-28 LAB — CBC WITH DIFFERENTIAL/PLATELET
Basophils Absolute: 0 K/uL (ref 0.0–0.1)
Basophils Relative: 0 %
Eosinophils Absolute: 0.2 K/uL (ref 0.0–0.7)
Eosinophils Relative: 4 %
HCT: 38 % (ref 36.0–46.0)
Hemoglobin: 12.2 g/dL (ref 12.0–15.0)
Lymphocytes Relative: 25 %
Lymphs Abs: 1.4 K/uL (ref 0.7–4.0)
MCH: 25.9 pg — ABNORMAL LOW (ref 26.0–34.0)
MCHC: 32.1 g/dL (ref 30.0–36.0)
MCV: 80.7 fL (ref 78.0–100.0)
Monocytes Absolute: 0.5 K/uL (ref 0.1–1.0)
Monocytes Relative: 9 %
Neutro Abs: 3.4 K/uL (ref 1.7–7.7)
Neutrophils Relative %: 62 %
Platelets: 367 K/uL (ref 150–400)
RBC: 4.71 MIL/uL (ref 3.87–5.11)
RDW: 15.4 % (ref 11.5–15.5)
WBC: 5.5 K/uL (ref 4.0–10.5)

## 2016-03-28 LAB — PREPARE RBC (CROSSMATCH)

## 2016-03-28 LAB — ABO/RH: ABO/RH(D): B POS

## 2016-03-28 LAB — APTT: APTT: 27 s (ref 24–36)

## 2016-03-28 LAB — PROTIME-INR
INR: 0.97
Prothrombin Time: 12.9 s (ref 11.4–15.2)

## 2016-03-28 NOTE — Progress Notes (Signed)
Patient given instructions with web site and code to sign up for "My Chart"  

## 2016-03-29 NOTE — H&P (Signed)
TOTAL KNEE ADMISSION H&P  Patient is being admitted for right total knee arthroplasty.  Subjective:  Chief Complaint:right knee pain.  HPI: Anna Hanna, 81 y.o. female, has a history of pain and functional disability in the right knee due to arthritis and has failed non-surgical conservative treatments for greater than 12 weeks to includeNSAID's and/or analgesics, corticosteriod injections, use of assistive devices, weight reduction as appropriate and activity modification.  Onset of symptoms was gradual, starting 5 years ago with gradually worsening course since that time. The patient noted no past surgery on the right knee(s).  Patient currently rates pain in the right knee(s) at 7 out of 10 with activity. Patient has night pain, worsening of pain with activity and weight bearing, pain that interferes with activities of daily living, pain with passive range of motion, crepitus and joint swelling.  Patient has evidence of subchondral cysts, subchondral sclerosis, periarticular osteophytes and joint space narrowing by imaging studies. There is no active infection.  There are no active problems to display for this patient.  Past Medical History:  Diagnosis Date  . Glaucoma   . Hypercholesteremia   . Hypertension   . Hypokalemia     Past Surgical History:  Procedure Laterality Date  . ABDOMINAL HYSTERECTOMY    . CATARACT EXTRACTION    . COLONOSCOPY N/A 12/10/2013   Procedure: COLONOSCOPY;  Surgeon: West Bali, MD;  Location: [facility];  Service: Endoscopy;  Laterality: N/A;  8:30 AM    No prescriptions prior to admission.   No Known Allergies  Social History  Substance Use Topics  . Smoking status: Current Some Day Smoker    Packs/day: 0.25    Years: 3.00    Types: Cigarettes  . Smokeless tobacco: Never Used  . Alcohol use No    Family History  Problem Relation Age of Onset  . Cancer Mother   . Arthritis Mother   . Hypotension Father   . Cancer Sister   .  Hypotension Sister   . Colon cancer Neg Hx    No current facility-administered medications for this encounter.   Current Outpatient Prescriptions:  .  albuterol (PROVENTIL HFA;VENTOLIN HFA) 108 (90 Base) MCG/ACT inhaler, Inhale into the lungs every 6 (six) hours as needed for wheezing or shortness of breath., Disp: , Rfl:  .  amLODipine (NORVASC) 5 MG tablet, Take 1 tablet by mouth Daily., Disp: , Rfl:  .  hydrochlorothiazide (MICROZIDE) 12.5 MG capsule, Take 1 capsule by mouth Daily., Disp: , Rfl:  .  latanoprost (XALATAN) 0.005 % ophthalmic solution, Place 1 drop into both eyes at bedtime., Disp: , Rfl:  .  potassium chloride SA (K-DUR,KLOR-CON) 20 MEQ tablet, Take 20 mEq by mouth Daily. , Disp: , Rfl:  .  timolol (TIMOPTIC) 0.5 % ophthalmic solution, Place 1 drop into both eyes Daily., Disp: , Rfl:  .  vitamin C (ASCORBIC ACID) 500 MG tablet, Take 500 mg by mouth daily., Disp: , Rfl:    Review of Systems  Respiratory: Negative for shortness of breath.   Cardiovascular: Negative for chest pain.  Musculoskeletal: Positive for back pain and joint pain.  All other systems reviewed and are negative.   Objective:  Physical Exam  Constitutional: She is oriented to person, place, and time. She appears well-nourished.  Eyes: Right eye exhibits no discharge. Left eye exhibits no discharge. No scleral icterus.  Neck: Neck supple. No JVD present. No tracheal deviation present.  Cardiovascular: Intact distal pulses.   Respiratory: Effort normal.  No stridor.  GI: Soft. She exhibits no distension.  Musculoskeletal:  Gait altered with right leg limp   Upper extremities normal alignment no contracture subluxation atrophy tremor or edema skin normal.  Left knee shows mild malalignment with valgus. Flexion only to 115 with a slight flexion contracture. Ligaments in the sagittal and coronal plane are stable. Muscle tone is normal. Skin is intact with no rash and no nodularity.  Right knee valgus  alignment lateral compartment pain tenderness small joint effusion. Collateral ligaments are stable without pseudo-laxity. There is no instability AP plane. Muscle tone and strength are normal extensor mechanism is intact. Skin is clean dry without rash lesion ulceration or erythema. Small flexion contracture of 5. Flexion of 110.  Neurological: She is alert and oriented to person, place, and time. She has normal reflexes. She exhibits normal muscle tone. Coordination normal.  Skin: Skin is warm and dry. No rash noted. No erythema. No pallor.  Psychiatric: She has a normal mood and affect. Her behavior is normal. Thought content normal.    Vital signs in last 24 hours:    Labs: CBC Latest Ref Rng & Units 03/28/2016  WBC 4.0 - 10.5 K/uL 5.5  Hemoglobin 12.0 - 15.0 g/dL 40.912.2  Hematocrit 81.136.0 - 46.0 % 38.0  Platelets 150 - 400 K/uL 367   BMP Latest Ref Rng & Units 03/28/2016  Glucose 65 - 99 mg/dL 914(N107(H)  BUN 6 - 20 mg/dL 15  Creatinine 8.290.44 - 5.621.00 mg/dL 1.300.61  Sodium 865135 - 784145 mmol/L 140  Potassium 3.5 - 5.1 mmol/L 3.7  Chloride 101 - 111 mmol/L 102  CO2 22 - 32 mmol/L 26  Calcium 8.9 - 10.3 mg/dL 9.1     Estimated body mass index is 37.02 kg/m as calculated from the following:   Height as of 03/28/16: 5\' 3"  (1.6 m).   Weight as of 03/28/16: 209 lb (94.8 kg).   Imaging Review Plain radiographs demonstrate severe degenerative joint disease of the right knee(s). The overall alignment ismild valgus. The bone quality appears to be fair for age and reported activity level.  Assessment/Plan:  End stage arthritis, right knee   The patient history, physical examination, clinical judgment of the provider and imaging studies are consistent with end stage degenerative joint disease of the right knee and right total knee arthroplasty is deemed medically necessary. The treatment options including medical management, injection therapy arthroscopy and arthroplasty were discussed at length. The risks  and benefits of total knee arthroplasty were presented and reviewed. The risks due to aseptic loosening, infection, stiffness, patella tracking problems, thromboembolic complications and other imponderables were discussed. The patient acknowledged the explanation, agreed to proceed with the plan and consent was signed. Patient is being admitted for inpatient treatment for surgery, pain control, PT, OT, prophylactic antibiotics, VTE prophylaxis, progressive ambulation and ADL's and discharge planning. The patient is planning to be discharged to inpatient rehab

## 2016-03-30 ENCOUNTER — Encounter (HOSPITAL_COMMUNITY): Payer: Self-pay | Admitting: *Deleted

## 2016-03-30 ENCOUNTER — Inpatient Hospital Stay (HOSPITAL_COMMUNITY): Payer: Medicare HMO | Admitting: Anesthesiology

## 2016-03-30 ENCOUNTER — Inpatient Hospital Stay (HOSPITAL_COMMUNITY)
Admission: RE | Admit: 2016-03-30 | Discharge: 2016-04-01 | DRG: 470 | Disposition: A | Discharge status: Home Health | Payer: Medicare HMO | Source: Ambulatory Visit | Attending: Orthopedic Surgery | Admitting: Orthopedic Surgery

## 2016-03-30 ENCOUNTER — Inpatient Hospital Stay (HOSPITAL_COMMUNITY): Payer: Medicare HMO

## 2016-03-30 ENCOUNTER — Encounter (HOSPITAL_COMMUNITY)
Admission: RE | Disposition: A | Discharge status: Home Health | Payer: Self-pay | Source: Ambulatory Visit | Attending: Orthopedic Surgery

## 2016-03-30 DIAGNOSIS — F17210 Nicotine dependence, cigarettes, uncomplicated: Secondary | ICD-10-CM | POA: Diagnosis present

## 2016-03-30 DIAGNOSIS — I10 Essential (primary) hypertension: Secondary | ICD-10-CM | POA: Diagnosis present

## 2016-03-30 DIAGNOSIS — M1711 Unilateral primary osteoarthritis, right knee: Secondary | ICD-10-CM | POA: Diagnosis present

## 2016-03-30 DIAGNOSIS — H409 Unspecified glaucoma: Secondary | ICD-10-CM | POA: Diagnosis present

## 2016-03-30 DIAGNOSIS — M129 Arthropathy, unspecified: Secondary | ICD-10-CM

## 2016-03-30 DIAGNOSIS — M9712XA Periprosthetic fracture around internal prosthetic left knee joint, initial encounter: Secondary | ICD-10-CM | POA: Diagnosis not present

## 2016-03-30 DIAGNOSIS — E7800 Pure hypercholesterolemia, unspecified: Secondary | ICD-10-CM | POA: Diagnosis present

## 2016-03-30 DIAGNOSIS — Z8261 Family history of arthritis: Secondary | ICD-10-CM | POA: Diagnosis not present

## 2016-03-30 DIAGNOSIS — Z09 Encounter for follow-up examination after completed treatment for conditions other than malignant neoplasm: Secondary | ICD-10-CM

## 2016-03-30 DIAGNOSIS — Z809 Family history of malignant neoplasm, unspecified: Secondary | ICD-10-CM

## 2016-03-30 DIAGNOSIS — M25561 Pain in right knee: Secondary | ICD-10-CM | POA: Diagnosis present

## 2016-03-30 HISTORY — PX: TOTAL KNEE ARTHROPLASTY: SHX125

## 2016-03-30 SURGERY — ARTHROPLASTY, KNEE, TOTAL
Anesthesia: Choice | Laterality: Right

## 2016-03-30 SURGERY — ARTHROPLASTY, KNEE, TOTAL
Anesthesia: Spinal | Site: Knee | Laterality: Right

## 2016-03-30 MED ORDER — DEXAMETHASONE SODIUM PHOSPHATE 4 MG/ML IJ SOLN
4.0000 mg | Freq: Once | INTRAMUSCULAR | Status: AC
  Administered 2016-03-30: 4 mg via INTRAVENOUS

## 2016-03-30 MED ORDER — LACTATED RINGERS IV SOLN
INTRAVENOUS | Status: DC
  Administered 2016-03-30: 09:00:00 via INTRAVENOUS
  Administered 2016-03-30: 1000 mL via INTRAVENOUS

## 2016-03-30 MED ORDER — METOCLOPRAMIDE HCL 10 MG PO TABS: 5.0000 mg | ORAL_TABLET | Freq: Three times a day (TID) | ORAL | Status: DC | PRN

## 2016-03-30 MED ORDER — BUPIVACAINE LIPOSOME 1.3 % IJ SUSP
INTRAMUSCULAR | Status: AC
  Filled 2016-03-30: qty 20

## 2016-03-30 MED ORDER — METHOCARBAMOL 1000 MG/10ML IJ SOLN
500.0000 mg | Freq: Once | INTRAVENOUS | Status: AC
  Administered 2016-03-30: 500 mg via INTRAVENOUS
  Filled 2016-03-30: qty 5

## 2016-03-30 MED ORDER — CEFAZOLIN SODIUM-DEXTROSE 2-4 GM/100ML-% IV SOLN
2.0000 g | Freq: Four times a day (QID) | INTRAVENOUS | Status: AC
  Administered 2016-03-30 (×2): 2 g via INTRAVENOUS
  Filled 2016-03-30 (×2): qty 100

## 2016-03-30 MED ORDER — ALUM & MAG HYDROXIDE-SIMETH 200-200-20 MG/5ML PO SUSP: 30.0000 mL | ORAL | Status: DC | PRN

## 2016-03-30 MED ORDER — SODIUM CHLORIDE 0.9 % IJ SOLN
INTRAMUSCULAR | Status: AC
  Filled 2016-03-30: qty 40

## 2016-03-30 MED ORDER — SODIUM CHLORIDE 0.9 % IJ SOLN
INTRAMUSCULAR | Status: AC
  Filled 2016-03-30: qty 10

## 2016-03-30 MED ORDER — DIPHENHYDRAMINE HCL 12.5 MG/5ML PO ELIX: 12.5000 mg | ORAL_SOLUTION | ORAL | Status: DC | PRN

## 2016-03-30 MED ORDER — PHENOL 1.4 % MT LIQD: 1.0000 | OROMUCOSAL | Status: DC | PRN

## 2016-03-30 MED ORDER — METHOCARBAMOL 500 MG PO TABS
500.0000 mg | ORAL_TABLET | Freq: Four times a day (QID) | ORAL | Status: DC | PRN
  Administered 2016-03-30 – 2016-03-31 (×2): 500 mg via ORAL
  Filled 2016-03-30 (×2): qty 1

## 2016-03-30 MED ORDER — TRANEXAMIC ACID 1000 MG/10ML IV SOLN
1000.0000 mg | INTRAVENOUS | Status: AC
  Administered 2016-03-30: 1000 mg via INTRAVENOUS
  Filled 2016-03-30: qty 10

## 2016-03-30 MED ORDER — EPHEDRINE SULFATE 50 MG/ML IJ SOLN
INTRAMUSCULAR | Status: AC
Start: 2016-03-30 — End: 2016-03-30
  Filled 2016-03-30: qty 1

## 2016-03-30 MED ORDER — HYDROMORPHONE HCL 1 MG/ML IJ SOLN: 0.2500 mg | INTRAMUSCULAR | Status: DC | PRN

## 2016-03-30 MED ORDER — DEXAMETHASONE SODIUM PHOSPHATE 4 MG/ML IJ SOLN
INTRAMUSCULAR | Status: AC
  Filled 2016-03-30: qty 1

## 2016-03-30 MED ORDER — PROPOFOL 10 MG/ML IV BOLUS
INTRAVENOUS | Status: DC | PRN
  Administered 2016-03-30 (×5): 10 mg via INTRAVENOUS

## 2016-03-30 MED ORDER — CHLORHEXIDINE GLUCONATE 4 % EX LIQD: 60.0000 mL | Freq: Once | CUTANEOUS | Status: DC

## 2016-03-30 MED ORDER — ALBUTEROL SULFATE (2.5 MG/3ML) 0.083% IN NEBU: 2.5000 mg | INHALATION_SOLUTION | Freq: Four times a day (QID) | RESPIRATORY_TRACT | Status: DC | PRN

## 2016-03-30 MED ORDER — AMLODIPINE BESYLATE 5 MG PO TABS
5.0000 mg | ORAL_TABLET | Freq: Every day | ORAL | Status: DC
  Administered 2016-03-30 – 2016-04-01 (×3): 5 mg via ORAL
  Filled 2016-03-30 (×3): qty 1

## 2016-03-30 MED ORDER — FENTANYL CITRATE (PF) 100 MCG/2ML IJ SOLN
INTRAMUSCULAR | Status: AC
  Filled 2016-03-30: qty 2

## 2016-03-30 MED ORDER — OXYCODONE HCL 5 MG PO TABS: 5.0000 mg | ORAL_TABLET | ORAL | Status: DC | PRN

## 2016-03-30 MED ORDER — MENTHOL 3 MG MT LOZG: 1.0000 | LOZENGE | OROMUCOSAL | Status: DC | PRN

## 2016-03-30 MED ORDER — DIPHENHYDRAMINE HCL 50 MG/ML IJ SOLN
25.0000 mg | Freq: Once | INTRAMUSCULAR | Status: AC
  Administered 2016-03-30: 25 mg via INTRAVENOUS
  Filled 2016-03-30: qty 1

## 2016-03-30 MED ORDER — SODIUM CHLORIDE 0.9 % IV SOLN
INTRAVENOUS | Status: DC
  Administered 2016-03-30 – 2016-03-31 (×3): via INTRAVENOUS

## 2016-03-30 MED ORDER — BUPIVACAINE-EPINEPHRINE (PF) 0.5% -1:200000 IJ SOLN
INTRAMUSCULAR | Status: DC | PRN
  Administered 2016-03-30: 30 mL via PERINEURAL

## 2016-03-30 MED ORDER — ONDANSETRON HCL 4 MG/2ML IJ SOLN: 4.0000 mg | Freq: Four times a day (QID) | INTRAMUSCULAR | Status: DC | PRN

## 2016-03-30 MED ORDER — VITAMIN C 500 MG PO TABS
500.0000 mg | ORAL_TABLET | Freq: Every day | ORAL | Status: DC
  Administered 2016-03-30 – 2016-04-01 (×3): 500 mg via ORAL
  Filled 2016-03-30 (×3): qty 1

## 2016-03-30 MED ORDER — SENNOSIDES-DOCUSATE SODIUM 8.6-50 MG PO TABS: 1.0000 | ORAL_TABLET | Freq: Every evening | ORAL | Status: DC | PRN

## 2016-03-30 MED ORDER — HYDROCHLOROTHIAZIDE 12.5 MG PO CAPS
12.5000 mg | ORAL_CAPSULE | Freq: Every day | ORAL | Status: DC
  Administered 2016-03-30 – 2016-04-01 (×3): 12.5 mg via ORAL
  Filled 2016-03-30 (×3): qty 1

## 2016-03-30 MED ORDER — PREGABALIN 50 MG PO CAPS
50.0000 mg | ORAL_CAPSULE | Freq: Once | ORAL | Status: AC
Start: 2016-03-30 — End: 2016-03-30
  Administered 2016-03-30: 50 mg via ORAL
  Filled 2016-03-30: qty 1

## 2016-03-30 MED ORDER — PHENYLEPHRINE 40 MCG/ML (10ML) SYRINGE FOR IV PUSH (FOR BLOOD PRESSURE SUPPORT)
PREFILLED_SYRINGE | INTRAVENOUS | Status: AC
  Filled 2016-03-30: qty 10

## 2016-03-30 MED ORDER — ASPIRIN EC 325 MG PO TBEC
325.0000 mg | DELAYED_RELEASE_TABLET | Freq: Every day | ORAL | Status: DC
  Administered 2016-03-31 – 2016-04-01 (×2): 325 mg via ORAL
  Filled 2016-03-30 (×2): qty 1

## 2016-03-30 MED ORDER — 0.9 % SODIUM CHLORIDE (POUR BTL) OPTIME
TOPICAL | Status: DC | PRN
  Administered 2016-03-30: 1000 mL

## 2016-03-30 MED ORDER — HYDROCODONE-ACETAMINOPHEN 5-325 MG PO TABS
1.0000 | ORAL_TABLET | ORAL | Status: DC
  Administered 2016-03-30 – 2016-03-31 (×5): 1 via ORAL
  Filled 2016-03-30 (×5): qty 1

## 2016-03-30 MED ORDER — MIDAZOLAM HCL 5 MG/5ML IJ SOLN
INTRAMUSCULAR | Status: DC | PRN
  Administered 2016-03-30 (×2): 1 mg via INTRAVENOUS

## 2016-03-30 MED ORDER — DEXAMETHASONE SODIUM PHOSPHATE 4 MG/ML IJ SOLN
10.0000 mg | Freq: Once | INTRAMUSCULAR | Status: AC
  Administered 2016-03-31: 10 mg via INTRAVENOUS
  Filled 2016-03-30: qty 3

## 2016-03-30 MED ORDER — TIMOLOL MALEATE 0.5 % OP SOLN
1.0000 [drp] | Freq: Two times a day (BID) | OPHTHALMIC | Status: DC
  Administered 2016-03-30 – 2016-03-31 (×2): 1 [drp] via OPHTHALMIC
  Filled 2016-03-30: qty 5

## 2016-03-30 MED ORDER — PHENYLEPHRINE HCL 10 MG/ML IJ SOLN
INTRAMUSCULAR | Status: DC | PRN
  Administered 2016-03-30 (×2): 60 ug via INTRAVENOUS
  Administered 2016-03-30: 40 ug via INTRAVENOUS

## 2016-03-30 MED ORDER — DOCUSATE SODIUM 100 MG PO CAPS
100.0000 mg | ORAL_CAPSULE | Freq: Two times a day (BID) | ORAL | Status: DC
  Administered 2016-03-30 – 2016-04-01 (×5): 100 mg via ORAL
  Filled 2016-03-30 (×4): qty 1

## 2016-03-30 MED ORDER — PROPOFOL 500 MG/50ML IV EMUL
INTRAVENOUS | Status: DC | PRN
  Administered 2016-03-30: 25 ug/kg/min via INTRAVENOUS
  Administered 2016-03-30: 09:00:00 via INTRAVENOUS

## 2016-03-30 MED ORDER — SODIUM CHLORIDE 0.9 % IV SOLN
INTRAVENOUS | Status: DC | PRN
  Administered 2016-03-30: 60 mL

## 2016-03-30 MED ORDER — MIDAZOLAM HCL 2 MG/2ML IJ SOLN
INTRAMUSCULAR | Status: AC
  Filled 2016-03-30: qty 2

## 2016-03-30 MED ORDER — LIDOCAINE HCL (PF) 1 % IJ SOLN
INTRAMUSCULAR | Status: AC
  Filled 2016-03-30: qty 5

## 2016-03-30 MED ORDER — SODIUM CHLORIDE 0.9 % IR SOLN
Status: DC | PRN
  Administered 2016-03-30: 2000 mL

## 2016-03-30 MED ORDER — ACETAMINOPHEN 650 MG RE SUPP: 650.0000 mg | Freq: Four times a day (QID) | RECTAL | Status: DC | PRN

## 2016-03-30 MED ORDER — BUPIVACAINE-EPINEPHRINE (PF) 0.5% -1:200000 IJ SOLN
INTRAMUSCULAR | Status: AC
  Filled 2016-03-30: qty 30

## 2016-03-30 MED ORDER — ONDANSETRON HCL 4 MG/2ML IJ SOLN
4.0000 mg | Freq: Once | INTRAMUSCULAR | Status: AC
  Administered 2016-03-30: 4 mg via INTRAVENOUS
  Filled 2016-03-30: qty 2

## 2016-03-30 MED ORDER — DIPHENHYDRAMINE HCL 25 MG PO CAPS
ORAL_CAPSULE | ORAL | Status: AC
  Filled 2016-03-30: qty 1

## 2016-03-30 MED ORDER — ALBUTEROL SULFATE HFA 108 (90 BASE) MCG/ACT IN AERS: 2.0000 | INHALATION_SPRAY | Freq: Four times a day (QID) | RESPIRATORY_TRACT | Status: DC | PRN

## 2016-03-30 MED ORDER — PROPOFOL 10 MG/ML IV BOLUS
INTRAVENOUS | Status: AC
  Filled 2016-03-30: qty 20

## 2016-03-30 MED ORDER — CELECOXIB 100 MG PO CAPS
200.0000 mg | ORAL_CAPSULE | Freq: Two times a day (BID) | ORAL | Status: DC
  Administered 2016-03-30 – 2016-04-01 (×4): 200 mg via ORAL
  Filled 2016-03-30 (×4): qty 2

## 2016-03-30 MED ORDER — HYDROCODONE-ACETAMINOPHEN 5-325 MG PO TABS
1.0000 | ORAL_TABLET | Freq: Once | ORAL | Status: AC
  Administered 2016-03-30: 1 via ORAL
  Filled 2016-03-30: qty 1

## 2016-03-30 MED ORDER — KETOROLAC TROMETHAMINE 15 MG/ML IJ SOLN
15.0000 mg | Freq: Four times a day (QID) | INTRAMUSCULAR | Status: AC
  Administered 2016-03-30 – 2016-03-31 (×4): 15 mg via INTRAVENOUS
  Filled 2016-03-30 (×4): qty 1

## 2016-03-30 MED ORDER — CEFAZOLIN SODIUM-DEXTROSE 2-4 GM/100ML-% IV SOLN
2.0000 g | INTRAVENOUS | Status: AC
  Administered 2016-03-30: 2 g via INTRAVENOUS
  Filled 2016-03-30: qty 100

## 2016-03-30 MED ORDER — BISACODYL 5 MG PO TBEC: 5.0000 mg | DELAYED_RELEASE_TABLET | Freq: Every day | ORAL | Status: DC | PRN

## 2016-03-30 MED ORDER — BUPIVACAINE LIPOSOME 1.3 % IJ SUSP
20.0000 mL | Freq: Once | INTRAMUSCULAR | Status: DC
  Filled 2016-03-30: qty 20

## 2016-03-30 MED ORDER — LATANOPROST 0.005 % OP SOLN
1.0000 [drp] | Freq: Every day | OPHTHALMIC | Status: DC
  Administered 2016-03-30 – 2016-03-31 (×2): 1 [drp] via OPHTHALMIC
  Filled 2016-03-30: qty 2.5

## 2016-03-30 MED ORDER — FLEET ENEMA 7-19 GM/118ML RE ENEM
1.0000 | ENEMA | Freq: Once | RECTAL | Status: DC | PRN
Start: 2016-03-30 — End: 2016-04-01

## 2016-03-30 MED ORDER — METOCLOPRAMIDE HCL 5 MG/ML IJ SOLN: 5.0000 mg | Freq: Three times a day (TID) | INTRAMUSCULAR | Status: DC | PRN

## 2016-03-30 MED ORDER — MIDAZOLAM HCL 2 MG/2ML IJ SOLN
1.0000 mg | INTRAMUSCULAR | Status: DC | PRN
  Administered 2016-03-30: 2 mg via INTRAVENOUS

## 2016-03-30 MED ORDER — ACETAMINOPHEN 325 MG PO TABS: 650.0000 mg | ORAL_TABLET | Freq: Four times a day (QID) | ORAL | Status: DC | PRN

## 2016-03-30 MED ORDER — FENTANYL CITRATE (PF) 100 MCG/2ML IJ SOLN
25.0000 ug | INTRAMUSCULAR | Status: AC | PRN
  Administered 2016-03-30 (×2): 25 ug via INTRAVENOUS

## 2016-03-30 MED ORDER — METHOCARBAMOL 1000 MG/10ML IJ SOLN
500.0000 mg | Freq: Four times a day (QID) | INTRAVENOUS | Status: DC | PRN
  Filled 2016-03-30: qty 5

## 2016-03-30 MED ORDER — FENTANYL CITRATE (PF) 100 MCG/2ML IJ SOLN
INTRAMUSCULAR | Status: DC | PRN
  Administered 2016-03-30 (×2): 25 ug via INTRAVENOUS

## 2016-03-30 MED ORDER — EPHEDRINE SULFATE 50 MG/ML IJ SOLN
INTRAMUSCULAR | Status: DC | PRN
  Administered 2016-03-30 (×2): 5 mg via INTRAVENOUS
  Administered 2016-03-30: 10 mg via INTRAVENOUS

## 2016-03-30 MED ORDER — ONDANSETRON HCL 4 MG PO TABS: 4.0000 mg | ORAL_TABLET | Freq: Four times a day (QID) | ORAL | Status: DC | PRN

## 2016-03-30 MED ORDER — POTASSIUM CHLORIDE CRYS ER 20 MEQ PO TBCR
20.0000 meq | EXTENDED_RELEASE_TABLET | Freq: Every day | ORAL | Status: DC
  Administered 2016-03-31 – 2016-04-01 (×2): 20 meq via ORAL
  Filled 2016-03-30 (×2): qty 1

## 2016-03-30 MED ORDER — DIPHENHYDRAMINE HCL 25 MG PO CAPS
25.0000 mg | ORAL_CAPSULE | Freq: Once | ORAL | Status: AC
  Administered 2016-03-30: 25 mg via ORAL

## 2016-03-30 MED ORDER — CELECOXIB 400 MG PO CAPS
400.0000 mg | ORAL_CAPSULE | Freq: Once | ORAL | Status: AC
  Administered 2016-03-30: 400 mg via ORAL
  Filled 2016-03-30: qty 1

## 2016-03-30 SURGICAL SUPPLY — 75 items
BAG HAMPER (MISCELLANEOUS) ×3 IMPLANT
BANDAGE ESMARK 6X9 LF (GAUZE/BANDAGES/DRESSINGS) ×1 IMPLANT
BIT DRILL 3.2X128 (BIT) IMPLANT
BIT DRILL 3.2X128MM (BIT)
BLADE HEX COATED 2.75 (ELECTRODE) ×3 IMPLANT
BLADE SAGITTAL 25.0X1.27X90 (BLADE) ×2 IMPLANT
BLADE SAGITTAL 25.0X1.27X90MM (BLADE) ×1
BLADE SAW SAG 90X13X1.27 (BLADE) ×2 IMPLANT
BNDG CMPR 9X6 STRL LF SNTH (GAUZE/BANDAGES/DRESSINGS) ×1
BNDG ESMARK 6X9 LF (GAUZE/BANDAGES/DRESSINGS) ×3
BOWL SMART MIX CTS (DISPOSABLE) IMPLANT
CAP KNEE TOTAL 3 SIGMA ×2 IMPLANT
CATH FOLEY 2WAY SLVR  5CC 12FR (CATHETERS) ×2
CATH FOLEY 2WAY SLVR  5CC 14FR (CATHETERS)
CATH FOLEY 2WAY SLVR 5CC 12FR (CATHETERS) IMPLANT
CATH FOLEY 2WAY SLVR 5CC 14FR (CATHETERS) IMPLANT
CEMENT HV SMART SET (Cement) ×6 IMPLANT
CLOTH BEACON ORANGE TIMEOUT ST (SAFETY) ×3 IMPLANT
COOLER CRYO CUFF IC AND MOTOR (MISCELLANEOUS) ×3 IMPLANT
COVER LIGHT HANDLE STERIS (MISCELLANEOUS) ×6 IMPLANT
CUFF CRYO KNEE LG 20X31 COOLER (ORTHOPEDIC SUPPLIES) IMPLANT
CUFF CRYO KNEE18X23 MED (MISCELLANEOUS) ×2 IMPLANT
CUFF TOURNIQUET SINGLE 34IN LL (TOURNIQUET CUFF) ×2 IMPLANT
CUFF TOURNIQUET SINGLE 44IN (TOURNIQUET CUFF) IMPLANT
DECANTER SPIKE VIAL GLASS SM (MISCELLANEOUS) ×3 IMPLANT
DRAPE BACK TABLE (DRAPES) ×3 IMPLANT
DRAPE EXTREMITY T 121X128X90 (DRAPE) ×3 IMPLANT
DRSG MEPILEX BORDER 4X12 (GAUZE/BANDAGES/DRESSINGS) ×3 IMPLANT
DURAPREP 26ML APPLICATOR (WOUND CARE) ×6 IMPLANT
ELECT REM PT RETURN 9FT ADLT (ELECTROSURGICAL) ×3
ELECTRODE REM PT RTRN 9FT ADLT (ELECTROSURGICAL) ×1 IMPLANT
EVACUATOR 3/16  PVC DRAIN (DRAIN) ×2
EVACUATOR 3/16 PVC DRAIN (DRAIN) ×1 IMPLANT
GLOVE BIO SURGEON STRL SZ7 (GLOVE) ×6 IMPLANT
GLOVE BIOGEL PI IND STRL 7.0 (GLOVE) ×2 IMPLANT
GLOVE BIOGEL PI INDICATOR 7.0 (GLOVE) ×4
GLOVE SKINSENSE NS SZ8.0 LF (GLOVE) ×4
GLOVE SKINSENSE STRL SZ8.0 LF (GLOVE) ×2 IMPLANT
GLOVE SS N UNI LF 8.5 STRL (GLOVE) ×3 IMPLANT
GOWN STRL REUS W/ TWL LRG LVL3 (GOWN DISPOSABLE) ×1 IMPLANT
GOWN STRL REUS W/TWL LRG LVL3 (GOWN DISPOSABLE) ×9 IMPLANT
GOWN STRL REUS W/TWL XL LVL3 (GOWN DISPOSABLE) ×3 IMPLANT
HANDPIECE INTERPULSE COAX TIP (DISPOSABLE) ×3
HOOD W/PEELAWAY (MISCELLANEOUS) ×12 IMPLANT
INST SET MAJOR BONE (KITS) ×3 IMPLANT
IV NS IRRIG 3000ML ARTHROMATIC (IV SOLUTION) ×3 IMPLANT
KIT BLADEGUARD II DBL (SET/KITS/TRAYS/PACK) ×3 IMPLANT
KIT ROOM TURNOVER APOR (KITS) ×3 IMPLANT
MANIFOLD NEPTUNE II (INSTRUMENTS) ×3 IMPLANT
MARKER SKIN DUAL TIP RULER LAB (MISCELLANEOUS) ×3 IMPLANT
NDL HYPO 21X1.5 SAFETY (NEEDLE) ×1 IMPLANT
NEEDLE HYPO 21X1.5 SAFETY (NEEDLE) ×3 IMPLANT
NS IRRIG 1000ML POUR BTL (IV SOLUTION) ×3 IMPLANT
PACK TOTAL JOINT (CUSTOM PROCEDURE TRAY) ×3 IMPLANT
PAD ARMBOARD 7.5X6 YLW CONV (MISCELLANEOUS) ×3 IMPLANT
PAD DANNIFLEX CPM (ORTHOPEDIC SUPPLIES) ×3 IMPLANT
PIN TROCAR 3 INCH (PIN) IMPLANT
SAW OSC TIP CART 19.5X105X1.3 (SAW) ×3 IMPLANT
SET BASIN LINEN APH (SET/KITS/TRAYS/PACK) ×3 IMPLANT
SET HNDPC FAN SPRY TIP SCT (DISPOSABLE) ×1 IMPLANT
STAPLER VISISTAT 35W (STAPLE) ×3 IMPLANT
SUT BONE WAX W31G (SUTURE) ×2 IMPLANT
SUT BRALON NAB BRD #1 30IN (SUTURE) ×6 IMPLANT
SUT MNCRL 0 VIOLET CTX 36 (SUTURE) ×1 IMPLANT
SUT MON AB 0 CT1 (SUTURE) ×3 IMPLANT
SUT MON AB 2-0 CT1 36 (SUTURE) IMPLANT
SUT MONOCRYL 0 CTX 36 (SUTURE) ×2
SYR 20CC LL (SYRINGE) ×6 IMPLANT
SYR 30ML LL (SYRINGE) ×3 IMPLANT
SYR BULB IRRIGATION 50ML (SYRINGE) ×3 IMPLANT
TOWEL OR 17X26 4PK STRL BLUE (TOWEL DISPOSABLE) ×3 IMPLANT
TOWER CARTRIDGE SMART MIX (DISPOSABLE) ×3 IMPLANT
TRAY FOLEY W/METER SILVER 16FR (SET/KITS/TRAYS/PACK) ×3 IMPLANT
WATER STERILE IRR 1000ML POUR (IV SOLUTION) ×6 IMPLANT
YANKAUER SUCT 12FT TUBE ARGYLE (SUCTIONS) ×3 IMPLANT

## 2016-03-30 NOTE — Anesthesia Postprocedure Evaluation (Signed)
Anesthesia Post Note  Patient: Anna Hanna  Procedure(s) Performed: Procedure(s) (LRB): TOTAL KNEE ARTHROPLASTY (Right)  Patient location during evaluation: PACU Anesthesia Type: Spinal Level of consciousness: awake and alert Pain management: pain level controlled Vital Signs Assessment: post-procedure vital signs reviewed and stable Respiratory status: spontaneous breathing Cardiovascular status: blood pressure returned to baseline Postop Assessment: spinal receding and no signs of nausea or vomiting Anesthetic complications: no    Last Vitals:  Vitals:   03/30/16 1030 03/30/16 1045  BP: (!) 97/52 (!) 92/52  Pulse: 85 71  Resp: (!) 22 (!) 24  Temp:      Last Pain:  Vitals:   03/30/16 0654  TempSrc: Oral                 Zia Najera

## 2016-03-30 NOTE — Interval H&P Note (Signed)
History and Physical Interval Note:  03/30/2016 7:23 AM BP 111/60   Pulse 87   Temp 98.4 F (36.9 C) (Oral)   Resp 18   Ht 5\' 3"  (1.6 m)   Wt 209 lb (94.8 kg)   SpO2 98%   BMI 37.02 kg/m  Skin clean at surgical site Anna Hanna Anna Hanna  has presented today for surgery, with the diagnosis of right knee osteoarthritis  The various methods of treatment have been discussed with the patient and family. After consideration of risks, benefits and other options for treatment, the patient has consented to  Procedure(s): TOTAL KNEE ARTHROPLASTY (Right) as a surgical intervention .  The patient's history has been reviewed, patient examined, no change in status, stable for surgery.  I have reviewed the patient's chart and labs.  Questions were answered to the patient's satisfaction.     Fuller CanadaStanley Amen Staszak

## 2016-03-30 NOTE — Op Note (Signed)
03/30/2016  9:57 AM  PATIENT:  Anna Hanna  81 y.o. female  PRE-OPERATIVE DIAGNOSIS:  right knee osteoarthritis  POST-OPERATIVE DIAGNOSIS:  right knee osteoarthritis  depuy tka 34f 2.5 t 10 ps poly 38 x 8.5 patella  Severe oa right knee l;ateral and ptf mild oa medial compartment  Mild valgus knee   PROCEDURE:  Procedure(s): TOTAL KNEE ARTHROPLASTY (Right)  SURGEON:  Surgeon(s) and Role:    * Vickki Hearing, MD - Primary  PHYSICIAN ASSISTANT:   ASSISTANTS: betty ashley and debbie dallas   ANESTHESIA:   spinal  EBL:  Total I/O In: 1000 [I.V.:1000] Out: 95 [Urine:95]  BLOOD ADMINISTERED:none  DRAINS: hemovac   LOCAL MEDICATIONS USED:  MARCAINE   , Amount: 30 ml and OTHER exparel 20/60  SPECIMEN:  No Specimen  DISPOSITION OF SPECIMEN:  N/A  COUNTS:  YES  TOURNIQUET:   Total Tourniquet Time Documented: Thigh (Right) - 81 minutes Total: Thigh (Right) - 81 minutes   DICTATION: .Reubin Milan Dictation  PLAN OF CARE: Admit to inpatient   PATIENT DISPOSITION:  PACU - hemodynamically stable.   Delay start of Pharmacological VTE agent (>24hrs) due to surgical blood loss or risk of bleeding: yes   Operative report for right total knee arthroplasty  The patient was identified in the preoperative holding area using 2 methods of identification. The operative extremity was evaluated and found to be acceptable for surgery. The chart was reviewed. The surgical site was marked.  The patient was taken to the operating room for spinal anesthesia. Antibiotic per SCIP protocol was used. (2 gm ancef ). A Foley catheter was inserted. The RIGHT KNEE AND  lower extremity was then prepped and draped for surgery using standadr sterile technique.   The surgical team was gowned in [employer]"  The timeout was executed and the surgical site RIGHT  Knee confirmed. Implants were present. X-rays were reviewed and available.  The RIGHT lower extremity was exsanguinated with a  six-inch Esmarch and the tourniquet was inflated to 300 mm of mercury. A standard midline incision was made. The incision was carried down to the extensor mechanism. A medial arthrotomy was performed and the patella was everted. The patellofemoral ligament was released.  The medial soft tissue sleeve was elevated to the mid coronal plane. The anterior cruciate ligament and PCL were resected along with the medial and lateral meniscus.  A 3/8  inch drill bit was used to open up the femoral canal which was decompressed with suction and  irrigation until the irrigation was clear. The distal resection was set for 11 mm 5 right  The distal resection guide was pinned in place and an oscillating saw was used to remove the distal 11 mm of bone.  The femur sizing guide measured the femur to a size 2 . A size 2  4-in-1 cutting block was pinned to the distal femur; with ligament retractors in place the 4 femoral cuts were made.  The external alignment guide was secured to the tibia. The stylus was set for 8 mm resection from the higher side of the tibia. Appropriate slope and rotation alignment was confirmed. Landmarks include the medial mid tibial tubercle junction, malleolar I., the second toe and tibial shaft. The proximal tibia was then resected. The tibia measured a size 2.5   Spacer blocks were placed and the knee balanced with a 10 mm spacer block.  The appropriate-sized femoral notch cutting guide was secured to the distal femur and the notch cut was  completed.  Trial components were placed and range of motion was checked along with stability. The prosthesis was stable with the following components  Femur 2 Tibia 2.5 Spacer 10   With the trial components in place any residual bone or osteophytes from the posterior condyles was removed with a curved osteotome.  The patella was skeletonized, measured for thickness and then the patella was resected using the cutting guide set at 12. The patella was  then remeasured and had a final thickness of 12. It was then sized to a size 38 x 8.5. The holes were then drilled in the patella.  The tibial tray was then attached to the proximal tibia and prepared with reaming and punching.  ExParel 20 cc was injected into the knee with specific attention paid to the posterior capsule  The knee was then irrigated while the cement was prepared. The prosthesis was then cemented into place  Excess cement was removed. The cement was allowed to cure under pressurization  Repeat irrigation and any residual cement fragments were removed  The knee was again checked for range of motion, patellar tracking and stability with the trial spacer in place. A polyethylene insert was then placed using a size 10  Irrigation was performed again. A drain was placed. 40 cc of dilute EXPAREL was then injected.  Closure was performed with #1 Bralon for the extensor mechanism. 30 cc of Marcaine with epinephrine was injected into the joint followed by 0 Monocryl for the subcutaneous tissue.  Skin was reapproximated with skin staples.   A sterile bandage was applied along with a TED hose.  The patient taken recovery in stable condition

## 2016-03-30 NOTE — Brief Op Note (Addendum)
03/30/2016  9:57 AM  PATIENT:  Lysle RubensMelba J Belknap  81 y.o. female  PRE-OPERATIVE DIAGNOSIS:  right knee osteoarthritis  POST-OPERATIVE DIAGNOSIS:  right knee osteoarthritis  depuy tka [id number] 2.5 t 10 ps poly 38 x 8.5 patella  Severe oa right knee l;ateral and ptf mild oa medial compartment  Mild valgus knee   PROCEDURE:  Procedure(s): TOTAL KNEE ARTHROPLASTY (Right)  SURGEON:  Surgeon(s) and Role:    * Vickki HearingStanley E Jenavieve Freda, MD - Primary  PHYSICIAN ASSISTANT:   ASSISTANTS: betty ashley and debbie dallas   ANESTHESIA:   spinal  EBL:  Total I/O In: 1000 [I.V.:1000] Out: 95 [Urine:95]  BLOOD ADMINISTERED:none  DRAINS: hemovac   LOCAL MEDICATIONS USED:  MARCAINE   , Amount: 30 ml and OTHER exparel 20/60  SPECIMEN:  No Specimen  DISPOSITION OF SPECIMEN:  N/A  COUNTS:  YES  TOURNIQUET:   Total Tourniquet Time Documented: Thigh (Right) - 81 minutes Total: Thigh (Right) - 81 minutes   DICTATION: .Reubin Milanragon Dictation  PLAN OF CARE: Admit to inpatient   PATIENT DISPOSITION:  PACU - hemodynamically stable.   Delay start of Pharmacological VTE agent (>24hrs) due to surgical blood loss or risk of bleeding: yes

## 2016-03-30 NOTE — Transfer of Care (Signed)
Immediate Anesthesia Transfer of Care Note  Patient: Anna Hanna  Procedure(s) Performed: Procedure(s): TOTAL KNEE ARTHROPLASTY (Right)  Patient Location: PACU  Anesthesia Type:Spinal  Level of Consciousness: awake  Airway & Oxygen Therapy: Patient Spontanous Breathing and Patient connected to face mask oxygen  Post-op Assessment: Report given to RN  Post vital signs: Reviewed and stable  Last Vitals:  Vitals:   03/30/16 0725 03/30/16 0730  BP: (!) 101/49   Pulse:    Resp: (!) 21 (!) 23  Temp:      Last Pain:  Vitals:   03/30/16 0654  TempSrc: Oral      Patients Stated Pain Goal: 5 (03/30/16 0654)  Complications: No apparent anesthesia complications

## 2016-03-30 NOTE — Anesthesia Preprocedure Evaluation (Signed)
Anesthesia Evaluation  Patient identified by MRN, date of birth, ID band Patient awake    Reviewed: Allergy & Precautions, NPO status , Patient's Chart, lab work & pertinent test results  Airway Mallampati: I  TM Distance: >3 FB Neck ROM: Full    Dental  (+) Edentulous Upper, Edentulous Lower   Pulmonary Current Smoker,    breath sounds clear to auscultation       Cardiovascular hypertension, Pt. on medications  Rhythm:Regular Rate:Normal     Neuro/Psych    GI/Hepatic negative GI ROS,   Endo/Other    Renal/GU      Musculoskeletal   Abdominal   Peds  Hematology   Anesthesia Other Findings   Reproductive/Obstetrics                             Anesthesia Physical Anesthesia Plan  ASA: II  Anesthesia Plan: Spinal   Post-op Pain Management:    Induction:   Airway Management Planned: Simple Face Mask  Additional Equipment:   Intra-op Plan:   Post-operative Plan:   Informed Consent: I have reviewed the patients History and Physical, chart, labs and discussed the procedure including the risks, benefits and alternatives for the proposed anesthesia with the patient or authorized representative who has indicated his/her understanding and acceptance.     Plan Discussed with:   Anesthesia Plan Comments:         Anesthesia Quick Evaluation

## 2016-03-30 NOTE — Anesthesia Procedure Notes (Signed)
Spinal  Patient location during procedure: OR Start time: 03/30/2016 7:49 AM Staffing Resident/CRNA: Glynn OctaveANIEL, Vaniyah Lansky E Preanesthetic Checklist Completed: patient identified, site marked, surgical consent, pre-op evaluation, timeout performed, IV checked, risks and benefits discussed and monitors and equipment checked Spinal Block Patient position: right lateral decubitus Prep: Betadine Patient monitoring: heart rate, cardiac monitor, continuous pulse ox and blood pressure Approach: right paramedian Location: L3-4 Injection technique: single-shot Needle Needle type: Spinocan  Needle gauge: 22 G Needle length: 9 cm Assessment Sensory level: T8 Additional Notes ATTEMPTS:1 TRAY WN:[id number] TRAY EXPIRATION DATE:12/21/2016

## 2016-03-31 LAB — BASIC METABOLIC PANEL
Anion gap: 6 (ref 5–15)
BUN: 9 mg/dL (ref 6–20)
CHLORIDE: 105 mmol/L (ref 101–111)
CO2: 25 mmol/L (ref 22–32)
CREATININE: 0.6 mg/dL (ref 0.44–1.00)
Calcium: 8.1 mg/dL — ABNORMAL LOW (ref 8.9–10.3)
GFR calc non Af Amer: >60 mL/min (ref >60)
Glucose, Bld: 109 mg/dL — ABNORMAL HIGH (ref 65–99)
POTASSIUM: 3.9 mmol/L (ref 3.5–5.1)
SODIUM: 136 mmol/L (ref 135–145)

## 2016-03-31 LAB — CBC
HCT: 30.7 % — ABNORMAL LOW (ref 36.0–46.0)
HEMOGLOBIN: 9.8 g/dL — AB (ref 12.0–15.0)
MCH: 25.8 pg — ABNORMAL LOW (ref 26.0–34.0)
MCHC: 31.9 g/dL (ref 30.0–36.0)
MCV: 80.8 fL (ref 78.0–100.0)
Platelets: 299 10*3/uL (ref 150–400)
RBC: 3.8 MIL/uL — AB (ref 3.87–5.11)
RDW: 15.5 % (ref 11.5–15.5)
WBC: 8.8 10*3/uL (ref 4.0–10.5)

## 2016-03-31 MED ORDER — HYDROCODONE-ACETAMINOPHEN 5-325 MG PO TABS
1.0000 | ORAL_TABLET | ORAL | Status: DC | PRN
  Administered 2016-03-31: 1 via ORAL
  Filled 2016-03-31: qty 1

## 2016-03-31 NOTE — Progress Notes (Signed)
Subjective: 1 Day Post-Op Procedure(s) (LRB): TOTAL KNEE ARTHROPLASTY (Right) Patient reports pain as moderate.    Objective: Vital signs in last 24 hours: Temp:  [97.5 F (36.4 C)-98.7 F (37.1 C)] 98.7 F (37.1 C) (09/08 0400) Pulse Rate:  [51-96] 96 (09/08 0400) Resp:  [13-24] 18 (09/08 0400) BP: (87-132)/(44-88) 123/71 (09/08 0400) SpO2:  [90 %-100 %] 95 % (09/08 0400)  CBC Latest Ref Rng & Units 03/31/2016 03/28/2016  WBC 4.0 - 10.5 K/uL 8.8 5.5  Hemoglobin 12.0 - 15.0 g/dL 3.0(Q9.8(L) 65.712.2  Hematocrit 36.0 - 46.0 % 30.7(L) 38.0  Platelets 150 - 400 K/uL 299 367   BMP Latest Ref Rng & Units 03/31/2016 03/28/2016  Glucose 65 - 99 mg/dL 846(N109(H) 629(B107(H)  BUN 6 - 20 mg/dL 9 15  Creatinine 2.840.44 - 1.00 mg/dL 1.320.60 4.400.61  Sodium 102135 - 145 mmol/L 136 140  Potassium 3.5 - 5.1 mmol/L 3.9 3.7  Chloride 101 - 111 mmol/L 105 102  CO2 22 - 32 mmol/L 25 26  Calcium 8.9 - 10.3 mg/dL 8.1(L) 9.1     Intake/Output from previous day: 09/07 0701 - 09/08 0700 In: 5720 [P.O.:720; I.V.:2500; IV Piggyback:300] Out: 1165 [Urine:1095; Drains:20; Blood:50] Intake/Output this shift: No intake/output data recorded. No results for input(s): LABPT, INR in the last 72 hours.    Assessment/Plan: 1 Day Post-Op Procedure(s) (LRB): TOTAL KNEE ARTHROPLASTY (Right) Advance diet Up with therapy D/C IV fluids  Anna Hanna 03/31/2016, 7:47 AM

## 2016-03-31 NOTE — Evaluation (Signed)
Occupational Therapy Evaluation Patient Details Name: Anna RubensMelba J Hanna MRN: [id number] DOB: [birth year] Today's Date: 03/31/2016    History of Present Illness Pt is a 81 y/o female s/p Right TKA on 03/30/16.    Clinical Impression   Pt awake, alert, oriented x4 this am, agreeable to OT evaluation. Pt performed toilet transfer and toileting tasks, transferring [facility] as [facility] unsure about reaching restroom. Pt required min assist for sit-stand and stand pivot transfer, min guard for toileting tasks. Verbal cuing required for hand placement, for sequencing intermittently. Pt reports her cousin is coming to stay with her on discharge from hospital, has a daughter who is local and will be checking in on her as well; both of whom will assist with ADL tasks as necessary. Pt would benefit from toilet riser and shower seat on discharge. No further OT services required at this time.     Follow Up Recommendations  No OT follow up;Supervision/Assistance - 24 hour    Equipment Recommendations  Tub/shower seat;Toilet rise with handles       Precautions / Restrictions Precautions Precautions: Knee Restrictions Weight Bearing Restrictions: Yes Other Position/Activity Restrictions: WBAT      Mobility Bed Mobility Overal bed mobility: Needs Assistance Bed Mobility: Supine to Sit;Sit to Supine     Supine to sit: Supervision Sit to supine: Supervision   General bed mobility comments: Verbal cuing for technique with sit to supine  Transfers Overall transfer level: Needs assistance Equipment used: Rolling walker (2 wheeled) Transfers: Sit to/from [employer];Stand Pivot Transfers Sit to Stand: Min assist Stand pivot transfers: Min assist       General transfer comment: Verbal cuing required for hand placement to push up from bed and BSC, as well as for reaching back before sitting         ADL Overall ADL's : Needs assistance/impaired                     Lower  Body Dressing: Maximal assistance;Sitting/lateral leans Lower Body Dressing Details (indicate cue type and reason): Pt requires assistance for donning socks due to pain in right knee Toilet Transfer: Minimal assistance;BSC;RW Toilet Transfer Details (indicate cue type and reason): Pt requires verbal cuing for hand placement, min assist due to knee pain Toileting- Clothing Manipulation and Hygiene: Min guard;Sit to/from stand                         Pertinent Vitals/Pain Pain Assessment: 0-10 Pain Score: 5  Pain Location: Right knee Pain Descriptors / Indicators: Burning Pain Intervention(s): Limited activity within patient's tolerance;Monitored during session;Ice applied     Hand Dominance Right   Extremity/Trunk Assessment Upper Extremity Assessment Upper Extremity Assessment: Overall WFL for tasks assessed   Lower Extremity Assessment Lower Extremity Assessment: Defer to PT evaluation       Communication Communication Communication: No difficulties   Cognition Arousal/Alertness: Awake/alert Behavior During Therapy: WFL for tasks assessed/performed Overall Cognitive Status: Within Functional Limits for tasks assessed                                Home Living Family/patient expects to be discharged to:: Private residence Living Arrangements: Alone (Pt cousin will be staying immediately post discharge)   Type of Home: Mobile home Home Access: Stairs to enter Entrance Stairs-Number of Steps: 2 Entrance Stairs-Rails: Can reach both Home Layout: One level  Bathroom Shower/Tub: Chief Strategy Officer: Standard     Home Equipment: [employer] - single point          Prior Functioning/Environment Level of Independence: Independent with assistive device(s)  Gait / Transfers Assistance Needed: Pt used SPC occasionally when knee was hurting ADL's / Homemaking Assistance Needed: Independent in all ADLs, drives, prepares meals        OT  Diagnosis: Acute pain   OT Problem List: Pain    End of Session Equipment Utilized During Treatment: Gait belt;Rolling walker  Activity Tolerance: Patient tolerated treatment well Patient left: in bed;with call bell/phone within reach;with family/visitor present (PT entering room)   Time: 0981-1914 OT Time Calculation (min): 31 min Charges:  OT General Charges $OT Visit: 1 Procedure OT Evaluation $OT Eval Low Complexity: 1 Procedure  Ezra Sites, OTR/L  [phone] 03/31/2016, 9:18 AM

## 2016-03-31 NOTE — Progress Notes (Signed)
Physical Therapy Treatment Patient Details Name: Anna Hanna MRN: [id number] DOB: 03/23/1935 Today's Date: 03/31/2016    History of Present Illness 81 yo F s/p R TKA.  PMH: glaucoma, HTN, hypokalemia, L partial mastectomy.     PT Comments    Pt received in bed, and was agreeable to PT tx.  Pt expressed that she is feeling tired, but able to work with PT this afternoon.  She is demonstrating all transfers at Mod (I) level, and gait with Min guard x 56ft with RW.  She was able to negotiate 2 steps with cues for sequencing and min guard today.  Continue to recommend d/c home with HHPT, and 24/7 supervision of cousin.     Follow Up Recommendations  Home health PT     Equipment Recommendations  Rolling walker with 5" wheels;3in1 (PT)    Recommendations for Other Services       Precautions / Restrictions Precautions Precautions: Knee Restrictions Weight Bearing Restrictions: Yes Other Position/Activity Restrictions: WBAT    Mobility  Bed Mobility Overal bed mobility: Modified Independent       Supine to sit: Modified independent (Device/Increase time) (increased time) Sit to supine: Supervision (increased time)      Transfers Overall transfer level: Needs assistance Equipment used: Rolling walker (2 wheeled) Transfers: Sit to/from [employer] Sit to Stand: Min guard Stand pivot transfers: Min guard          Ambulation/Gait Ambulation/Gait assistance: Min guard Ambulation Distance (Feet): 80 Feet Assistive device: Rolling walker (2 wheeled) Gait Pattern/deviations: Step-through pattern;Antalgic     General Gait Details: Pt fatigues with gait distance   Stairs Stairs: Yes Stairs assistance: Min guard Stair Management: Two rails;Step to pattern Number of Stairs: 2    Wheelchair Mobility    Modified Rankin (Stroke Patients Only)       Balance Overall balance assessment: Needs assistance   Sitting balance-Leahy Scale: Normal      Standing balance support: Bilateral upper extremity supported Standing balance-Leahy Scale: Fair                      Cognition Arousal/Alertness: Awake/alert Behavior During Therapy: Impulsive Overall Cognitive Status: Within Functional Limits for tasks assessed                      Exercises Total Joint Exercises Ankle Circles/Pumps: Strengthening;10 reps;Supine;Limitations Ankle Circles/Pumps Limitations: vc's to perform full range Quad Sets: Strengthening;Both;10 reps;Supine Gluteal Sets: Strengthening;Both;10 reps;Supine Short Arc Quad: Strengthening;Right;10 reps;Supine Heel Slides: Strengthening;Right;10 reps;Supine Goniometric ROM: R knee extension -5 to 67* of R knee flexion which is limited by pain    General Comments        Pertinent Vitals/Pain Pain Assessment: 0-10 Pain Score: 2  Pain Location: R knee Pain Descriptors / Indicators: Burning Pain Intervention(s): Limited activity within patient's tolerance;Monitored during session;Repositioned    Home Living                      Prior Function            PT Goals (current goals can now be found in the care plan section) Acute Rehab PT Goals Patient Stated Goal: Pt wants to get stronger to go back home.  PT Goal Formulation: With patient Time For Goal Achievement: 04/07/16 Potential to Achieve Goals: Good Progress towards PT goals: Progressing toward goals    Frequency  BID    PT Plan Current plan remains appropriate  Co-evaluation             End of Session Equipment Utilized During Treatment: Gait belt Activity Tolerance: Patient tolerated treatment well Patient left: in bed;in CPM     Time: 1335-1407 PT Time Calculation (min) (ACUTE ONLY): 32 min  Charges:  $Gait Training: 8-22 mins $Therapeutic Exercise: 8-22 mins $Therapeutic Activity: 8-22 mins                    G Codes:  Functional Assessment Tool Used: [employer] AM-PAC "6-clicks"   Functional Limitation: Mobility: Walking and moving around Mobility: Walking and Moving Around Current Status [phone](G8978): At least 40 percent but less than 60 percent impaired, limited or restricted Mobility: Walking and Moving Around Goal Status [phone](G8979): At least 20 percent but less than 40 percent impaired, limited or restricted   Beth Zakee Deerman, PT, DPT X: [phone]

## 2016-03-31 NOTE — Anesthesia Postprocedure Evaluation (Signed)
Anesthesia Post Note  Patient: Anna Hanna  Procedure(s) Performed: Procedure(s) (LRB): TOTAL KNEE ARTHROPLASTY (Right)  Patient location during evaluation: Nursing Unit Anesthesia Type: Spinal Level of consciousness: awake and alert and oriented Pain management: pain level controlled Vital Signs Assessment: post-procedure vital signs reviewed and stable Respiratory status: spontaneous breathing Cardiovascular status: blood pressure returned to baseline Postop Assessment: no headache, no backache, no signs of nausea or vomiting and adequate PO intake Anesthetic complications: no    Last Vitals:  Vitals:   03/30/16 2021 03/31/16 0400  BP: (!) 132/57 123/71  Pulse: 88 96  Resp: 20 18  Temp: 36.8 C 37.1 C    Last Pain:  Vitals:   03/31/16 0539  TempSrc:   PainSc: 4                  Durrell Barajas

## 2016-03-31 NOTE — Evaluation (Signed)
Physical Therapy Evaluation Patient Details Name: Anna Hanna MRN: [id number] DOB: [birth year] Today's Date: 03/31/2016   History of Present Illness  81 yo F s/p R TKA.  PMH: glaucoma, HTN, hypokalemia, L partial mastectomy.   Clinical Impression  Pt received in bed, and was agreeable to PT evaluation.  Pt expressed that she normally lives alone and is very independent with all aspects of functional mobility, as well as ADL's.  Today during PT evaluation she demonstrates Mod (I) for bed mobility, Min guard for sit<>stand, as well as Min guard for gait x 69ft with RW.  Pt requires vc's for smaller step length during gait, and to keep feet inside base of the RW.  Pt demonstrated good technique with LE exercises.  Plan to practice steps this PM, and pt will likely be ready for d/c home with HHPT, RW, BSC, and supervision of her cousin.      Follow Up Recommendations Home health PT    Equipment Recommendations  Rolling walker with 5" wheels;3in1 (PT)    Recommendations for Other Services       Precautions / Restrictions Precautions Precautions: Knee Restrictions Weight Bearing Restrictions: Yes Other Position/Activity Restrictions: WBAT      Mobility  Bed Mobility Overal bed mobility: Modified Independent Bed Mobility: Supine to Sit;Sit to Supine     Supine to sit: Modified independent (Device/Increase time) Sit to supine:  (NT)   General bed mobility comments: Verbal cuing for technique with sit to supine  Transfers Overall transfer level: Needs assistance Equipment used: Rolling walker (2 wheeled) Transfers: Sit to/from [employer] Sit to Stand: Min guard (vc's for hand placement) Stand pivot transfers: Min guard (vc's to get lined up with the chair prior to sitting down. )       General transfer comment: Verbal cuing required for hand placement to push up from bed and BSC, as well as for reaching back before sitting  Ambulation/Gait Ambulation/Gait  assistance: Min assist Ambulation Distance (Feet): 60 Feet Assistive device: Rolling walker (2 wheeled) Gait Pattern/deviations: Step-through pattern;Antalgic        Stairs            Wheelchair Mobility    Modified Rankin (Stroke Patients Only)       Balance Overall balance assessment: Needs assistance   Sitting balance-Leahy Scale: Normal     Standing balance support: Bilateral upper extremity supported Standing balance-Leahy Scale: Fair                               Pertinent Vitals/Pain Pain Assessment: 0-10 Pain Score: 5  Pain Location: Right knee Pain Descriptors / Indicators: Burning Pain Intervention(s): Limited activity within patient's tolerance;Monitored during session;Ice applied    Home Living Family/patient expects to be discharged to:: Private residence Living Arrangements: Alone (Pt cousin will be staying immediately post discharge)   Type of Home: Mobile home Home Access: Stairs to enter Entrance Stairs-Rails: Can reach both Entrance Stairs-Number of Steps: 2 Home Layout: One level Home Equipment: Cane - single point      Prior Function Level of Independence: Independent with assistive device(s)   Gait / Transfers Assistance Needed: Pt used SPC occasionally when knee was hurting  ADL's / Homemaking Assistance Needed: Independent in all ADLs, drives, prepares meals        Hand Dominance   Dominant Hand: Right    Extremity/Trunk Assessment   Upper Extremity Assessment: Overall WFL for tasks assessed  Lower Extremity Assessment: RLE deficits/detail RLE Deficits / Details: Generalized weakness       Communication   Communication: No difficulties  Cognition Arousal/Alertness: Awake/alert Behavior During Therapy: WFL for tasks assessed/performed Overall Cognitive Status: Within Functional Limits for tasks assessed                      General Comments      Exercises Total Joint  Exercises Ankle Circles/Pumps: Strengthening;10 reps;Supine;Limitations Ankle Circles/Pumps Limitations: vc's to perform full range Quad Sets: Strengthening;Both;10 reps;Supine Gluteal Sets: Strengthening;Both;10 reps;Supine Short Arc Quad: Strengthening;Right;10 reps;Supine Heel Slides: Strengthening;Right;10 reps;Supine Goniometric ROM: R knee extension -5 to 67* of R knee flexion which is limited by pain      Assessment/Plan    PT Assessment Patient needs continued PT services  PT Diagnosis Difficulty walking;Abnormality of gait;Generalized weakness;Acute pain   PT Problem List Decreased strength;Decreased range of motion;Decreased activity tolerance;Decreased balance;Decreased mobility;Decreased knowledge of use of DME;Decreased safety awareness;Decreased knowledge of precautions;Pain;Obesity  PT Treatment Interventions DME instruction;Gait training;Stair training;Functional mobility training;Therapeutic activities;Therapeutic exercise;Balance training;Patient/family education   PT Goals (Current goals can be found in the Care Plan section) Acute Rehab PT Goals Patient Stated Goal: Pt wants to get stronger to go back home.  PT Goal Formulation: With patient Time For Goal Achievement: 04/07/16 Potential to Achieve Goals: Good    Frequency BID   Barriers to discharge Decreased caregiver support Pt normally lives alone, but states that her cousin will be staying with her for at least a week, and then she has other friends and family that can assist her if needed    Co-evaluation               End of Session Equipment Utilized During Treatment: Gait belt Activity Tolerance: Patient tolerated treatment well Patient left: in chair      Functional Assessment Tool Used: [employer] AM-PAC "6-clicks"  Functional Limitation: Mobility: Walking and moving around Mobility: Walking and Moving Around Current Status [phone](G8978): At least 40 percent but less than 60 percent  impaired, limited or restricted Mobility: Walking and Moving Around Goal Status [phone](G8979): At least 20 percent but less than 40 percent impaired, limited or restricted    Time: 0901-0952 PT Time Calculation (min) (ACUTE ONLY): 51 min   Charges:   PT Evaluation $PT Eval Low Complexity: 1 Procedure PT Treatments $Gait Training: 8-22 mins $Therapeutic Exercise: 8-22 mins   PT G Codes:   PT G-Codes **NOT FOR INPATIENT CLASS** Functional Assessment Tool Used: [employer] AM-PAC "6-clicks"  Functional Limitation: Mobility: Walking and moving around Mobility: Walking and Moving Around Current Status [phone]([phone]): At least 40 percent but less than 60 percent impaired, limited or restricted Mobility: Walking and Moving Around Goal Status [phone](G8979): At least 20 percent but less than 40 percent impaired, limited or restricted    Beth Merrily Tegeler, PT, DPT X: [phone]

## 2016-03-31 NOTE — Care Management Note (Addendum)
Case Management Note  Patient Details  Name: Anna Hanna MRN: [id number] Date of Birth: 08-30-1934  Subjective/Objective: Patient adm from home for total knee replacement. She plans to have a grandson and cousin stay with her while she is recovering.                Action/Plan: Home Health has been ordered with [facility]. After multiple attempts to call office and medical modalities, CPM order has been canceled due to insurance being out of network. Dr. Mort SawyersHarrison's office is closed, I have paged Dr. Romeo AppleHarrison, no answer yet. DME RW and 3 in 1 has been ordered with [facility] and will be delivered to patient's room today or tomorrow morning (pt. States she will be discharged tomorrow). Orders/facesheet/H&P have been faxed to [facility] at [phone], verification received. Account # with [facility] for patient is [id number].   LATER ENTRY:  Spoke with Dr. Romeo AppleHarrison, he placed CPM order in Epic for home use and I tried to order with [facility], who states that they no longer supply CPM's. I Contacted [facility], they no longer supply CPM's unless patient is in a skilled nursing facility. I can't find another DME company besides these two who will accept [facility]. I have paged Dr Romeo AppleHarrison again, no return call yet.   Spoke with Dr. Romeo AppleHarrison at 3:30 about no availability of CPM for patient. Patient will not have a CPM at discharge.   Expected Discharge Date:  04/01/16               Expected Discharge Plan:  Home w Home Health Services  In-House Referral:  NA  Discharge planning Services  CM Consult  Post Acute Care Choice:    Choice offered to:  Patient  DME Arranged:  3-N-1, Walker rolling DME [facility]  HH Arranged:  PT [facility]  Status of Service:  In process, will continue to follow  If discussed at Long Length of Stay Meetings, dates discussed:    Additional Comments:  Reannon Candella, Chrystine OilerSharley Diane, RN 03/31/2016, 1:46 PM

## 2016-03-31 NOTE — Clinical Social Work Psych Assess (Signed)
CSW received consult for possible SNF. PT/OT recommending return home. CSW will sign off, but can be reconsulted if needed.   Derenda FennelKara Seraphine Gudiel, LCSW [phone]

## 2016-03-31 NOTE — Addendum Note (Signed)
Addendum  created 03/31/16 0827 by Moshe SalisburyKaren E Kortlynn Poust, CRNA   Sign clinical note

## 2016-04-01 DIAGNOSIS — M9712XA Periprosthetic fracture around internal prosthetic left knee joint, initial encounter: Secondary | ICD-10-CM

## 2016-04-01 LAB — CBC
HCT: 28.3 % — ABNORMAL LOW (ref 36.0–46.0)
HEMOGLOBIN: 8.9 g/dL — AB (ref 12.0–15.0)
MCH: 25.6 pg — AB (ref 26.0–34.0)
MCHC: 31.4 g/dL (ref 30.0–36.0)
MCV: 81.6 fL (ref 78.0–100.0)
PLATELETS: 301 10*3/uL (ref 150–400)
RBC: 3.47 MIL/uL — ABNORMAL LOW (ref 3.87–5.11)
RDW: 15.3 % (ref 11.5–15.5)
WBC: 9.1 10*3/uL (ref 4.0–10.5)

## 2016-04-01 MED ORDER — ASPIRIN 325 MG PO TBEC: 325.0000 mg | DELAYED_RELEASE_TABLET | Freq: Every day | ORAL | 0 refills | Status: DC

## 2016-04-01 MED ORDER — HYDROCODONE-ACETAMINOPHEN 5-325 MG PO TABS: 1.0000 | ORAL_TABLET | ORAL | 0 refills | Status: DC | PRN

## 2016-04-01 NOTE — Progress Notes (Addendum)
CM was able to speak with patient regarding equipment that she received.  CM contacted [facility] and spoke with [name] regarding CPM.  Faxed information for review.  Insurance information reviewed spoke with [name] and confirmed the CPM can be delivered to the hospital or home.    Spoke with the patient before discharge and confirmed correct address and received a backup phone number to contact neighbor regarding delivery.  Anna Hanna [phone].  Contacted [facility] and provided backup cell number to have for delivery.   Spoke with Anna Hanna at [facility] regarding home health services.  Information has been received and home health will start on Sunday.  Informed home health patient requesting for nurse visit.  Patient will need an updated order for the nurse to be added for home health visit along with PT.

## 2016-04-01 NOTE — Progress Notes (Signed)
Patient's IV removed. Site WNL.  AVS reviewed with patient.  Verbalized understanding of discharge instructions, physician follow-up, medications, home health.  Patient given prescription for pain medication.  [facility] to contact patient regarding CPM machine per case manager.  Patient transported by NT via wheelchair to main entrance for discharge.  Patient stable at time of discharge.

## 2016-04-01 NOTE — Progress Notes (Signed)
Called to pt room at 0600 by NT.  Pt accidentally pulled out R knee drain while pulling up underwear after using BSC.  Pt denies pain.  Site noted with sangenous drainage, covered with dry guaze.  Dr Romeo AppleHarrison paged to inform drain accidnetally removed.  MD states he had planned to remove drain today and asked to save drain at bedside for is assessment to ensure entire drain was removed.  Will place drain at bedside for MD assessment.     04/01/16 0600  [REMOVED] Closed System Drain 1 Right Knee Accordion (Hemovac) 15 Fr.  Removal Date/Time: 04/01/16 0600  Placement Date/Time: 03/30/16 [id number]   Person Inserting Catheter: Dr. Harrel CarinaHarrison  Tube Number: 1  Orientation: Right  Location: Knee  Drain Tube Type: Accordion (Hemovac)  Size (Fr.): 15 Fr.  Size (mm): 4.7 mm  Removal Re...  Site Description Bleeding  Dressing Status Clean;Dry;Intact;New drainage  Drainage Appearance Bloody

## 2016-04-01 NOTE — Discharge Summary (Signed)
  Physician Discharge Summary  Patient ID: Anna RubensMelba J Hanna MRN: [id number] DOB/AGE: 10-11-1934 81 y.o.  Admit date: 03/30/2016 Discharge date: 04/01/2016  Admission Diagnoses: oa right knee   Discharge Diagnoses: same  Active Problems:   Arthritis of knee, right   Osteoarthritis of right knee   Discharged Condition: good  Hospital Course:  HD 1 9/7 RIGHT TKA; DEPUY FB PS SIGMA HD 2 WALKED 60 FT , PAIN CONTROLLED HD 3 DC        Discharge Exam: Blood pressure 135/62, pulse 85, temperature 98.1 F (36.7 C), temperature source Oral, resp. rate 20, height 5\' 3"  (1.6 m), weight 209 lb (94.8 kg), SpO2 95 %. General appearance: alert and cooperative Incision/Wound:CLEAN   Disposition: 01-Home or Self Care  Discharge Instructions    Call MD / Call 911    Complete by:  As directed   If you experience chest pain or shortness of breath, CALL 911 and be transported to the hospital emergency room.  If you develope a fever above 101 F, pus (white drainage) or increased drainage or redness at the wound, or calf pain, call your surgeon's office.   Change dressing    Complete by:  As directed   Dr will change dressing ,   Constipation Prevention    Complete by:  As directed   Drink plenty of fluids.  Prune juice may be helpful.  You may use a stool softener, such as Colace (over the counter) 100 mg twice a day.  Use MiraLax (over the counter) for constipation as needed.   Diet - low sodium heart healthy    Complete by:  As directed   Do not put a pillow under the knee. Place it under the heel.    Complete by:  As directed   Increase activity slowly as tolerated    Complete by:  As directed   TED hose    Complete by:  As directed   Use stockings (TED hose) for 2 weeks on both leg(s).  You may remove them at night for sleeping.       Medication List    TAKE these medications   albuterol 108 (90 Base) MCG/ACT inhaler Commonly known as:  PROVENTIL HFA;VENTOLIN HFA Inhale into the lungs  every 6 (six) hours as needed for wheezing or shortness of breath.   amLODipine 5 MG tablet Commonly known as:  NORVASC Take 1 tablet by mouth Daily.   aspirin 325 MG EC tablet Take 1 tablet (325 mg total) by mouth daily with breakfast.   hydrochlorothiazide 12.5 MG capsule Commonly known as:  MICROZIDE Take 1 capsule by mouth Daily.   HYDROcodone-acetaminophen 5-325 MG tablet Commonly known as:  NORCO/VICODIN Take 1 tablet by mouth every 4 (four) hours as needed for moderate pain.   latanoprost 0.005 % ophthalmic solution Commonly known as:  XALATAN Place 1 drop into both eyes at bedtime.   potassium chloride SA 20 MEQ tablet Commonly known as:  K-DUR,KLOR-CON Take 20 mEq by mouth Daily.   timolol 0.5 % ophthalmic solution Commonly known as:  TIMOPTIC Place 1 drop into both eyes Daily.   vitamin C 500 MG tablet Commonly known as:  ASCORBIC ACID Take 500 mg by mouth daily.        Signed: Fuller CanadaStanley Maxamus Colao 04/01/2016, 11:28 AM

## 2016-04-02 ENCOUNTER — Emergency Department (HOSPITAL_COMMUNITY): Payer: Medicare HMO

## 2016-04-02 ENCOUNTER — Encounter (HOSPITAL_COMMUNITY): Payer: Self-pay

## 2016-04-02 ENCOUNTER — Inpatient Hospital Stay (HOSPITAL_COMMUNITY)
Admission: EM | Admit: 2016-04-02 | Discharge: 2016-04-03 | DRG: 534 | Disposition: A | Discharge status: Other Acute Inpatient Hospital | Payer: Medicare HMO | Attending: Orthopedic Surgery | Admitting: Orthopedic Surgery

## 2016-04-02 DIAGNOSIS — S72351A Displaced comminuted fracture of shaft of right femur, initial encounter for closed fracture: Secondary | ICD-10-CM

## 2016-04-02 DIAGNOSIS — D649 Anemia, unspecified: Secondary | ICD-10-CM | POA: Diagnosis not present

## 2016-04-02 DIAGNOSIS — H409 Unspecified glaucoma: Secondary | ICD-10-CM | POA: Diagnosis not present

## 2016-04-02 DIAGNOSIS — Z8261 Family history of arthritis: Secondary | ICD-10-CM

## 2016-04-02 DIAGNOSIS — M25561 Pain in right knee: Secondary | ICD-10-CM | POA: Diagnosis not present

## 2016-04-02 DIAGNOSIS — S7290XA Unspecified fracture of unspecified femur, initial encounter for closed fracture: Secondary | ICD-10-CM | POA: Diagnosis present

## 2016-04-02 DIAGNOSIS — W1830XA Fall on same level, unspecified, initial encounter: Secondary | ICD-10-CM | POA: Diagnosis present

## 2016-04-02 DIAGNOSIS — Z96651 Presence of right artificial knee joint: Secondary | ICD-10-CM | POA: Diagnosis present

## 2016-04-02 DIAGNOSIS — R739 Hyperglycemia, unspecified: Secondary | ICD-10-CM

## 2016-04-02 DIAGNOSIS — M160 Bilateral primary osteoarthritis of hip: Secondary | ICD-10-CM | POA: Diagnosis present

## 2016-04-02 DIAGNOSIS — I10 Essential (primary) hypertension: Secondary | ICD-10-CM | POA: Diagnosis not present

## 2016-04-02 DIAGNOSIS — Y92009 Unspecified place in unspecified non-institutional (private) residence as the place of occurrence of the external cause: Secondary | ICD-10-CM

## 2016-04-02 DIAGNOSIS — E7800 Pure hypercholesterolemia, unspecified: Secondary | ICD-10-CM | POA: Diagnosis present

## 2016-04-02 DIAGNOSIS — S72401A Unspecified fracture of lower end of right femur, initial encounter for closed fracture: Secondary | ICD-10-CM | POA: Diagnosis not present

## 2016-04-02 DIAGNOSIS — Z7982 Long term (current) use of aspirin: Secondary | ICD-10-CM

## 2016-04-02 DIAGNOSIS — F17210 Nicotine dependence, cigarettes, uncomplicated: Secondary | ICD-10-CM | POA: Diagnosis present

## 2016-04-02 DIAGNOSIS — Z809 Family history of malignant neoplasm, unspecified: Secondary | ICD-10-CM | POA: Diagnosis not present

## 2016-04-02 DIAGNOSIS — E871 Hypo-osmolality and hyponatremia: Secondary | ICD-10-CM | POA: Diagnosis not present

## 2016-04-02 DIAGNOSIS — E785 Hyperlipidemia, unspecified: Secondary | ICD-10-CM | POA: Diagnosis not present

## 2016-04-02 DIAGNOSIS — S7291XA Unspecified fracture of right femur, initial encounter for closed fracture: Secondary | ICD-10-CM | POA: Diagnosis present

## 2016-04-02 HISTORY — DX: Presence of right artificial knee joint: Z96.651

## 2016-04-02 LAB — CBC WITH DIFFERENTIAL/PLATELET
BASOS PCT: 0 %
Basophils Absolute: 0 10*3/uL (ref 0.0–0.1)
EOS ABS: 0 10*3/uL (ref 0.0–0.7)
EOS PCT: 0 %
HCT: 30.5 % — ABNORMAL LOW (ref 36.0–46.0)
HEMOGLOBIN: 9.8 g/dL — AB (ref 12.0–15.0)
Lymphocytes Relative: 9 %
Lymphs Abs: 1.3 10*3/uL (ref 0.7–4.0)
MCH: 25.9 pg — ABNORMAL LOW (ref 26.0–34.0)
MCHC: 32.1 g/dL (ref 30.0–36.0)
MCV: 80.5 fL (ref 78.0–100.0)
MONOS PCT: 7 %
Monocytes Absolute: 1 10*3/uL (ref 0.1–1.0)
NEUTROS PCT: 84 %
Neutro Abs: 11.6 10*3/uL — ABNORMAL HIGH (ref 1.7–7.7)
PLATELETS: 320 10*3/uL (ref 150–400)
RBC: 3.79 MIL/uL — ABNORMAL LOW (ref 3.87–5.11)
RDW: 15.4 % (ref 11.5–15.5)
WBC: 14 10*3/uL — ABNORMAL HIGH (ref 4.0–10.5)

## 2016-04-02 LAB — COMPREHENSIVE METABOLIC PANEL
ALBUMIN: 3.3 g/dL — AB (ref 3.5–5.0)
ALK PHOS: 79 U/L (ref 38–126)
ALT: 13 U/L — ABNORMAL LOW (ref 14–54)
ANION GAP: 5 (ref 5–15)
AST: 15 U/L (ref 15–41)
BUN: 14 mg/dL (ref 6–20)
CHLORIDE: 102 mmol/L (ref 101–111)
CO2: 25 mmol/L (ref 22–32)
Calcium: 8.3 mg/dL — ABNORMAL LOW (ref 8.9–10.3)
Creatinine, Ser: 0.6 mg/dL (ref 0.44–1.00)
GFR calc non Af Amer: >60 mL/min (ref >60)
GLUCOSE: 132 mg/dL — AB (ref 65–99)
POTASSIUM: 4.2 mmol/L (ref 3.5–5.1)
SODIUM: 132 mmol/L — AB (ref 135–145)
Total Bilirubin: 0.6 mg/dL (ref 0.3–1.2)
Total Protein: 7.2 g/dL (ref 6.5–8.1)

## 2016-04-02 MED ORDER — SODIUM CHLORIDE 0.9 % IV BOLUS (SEPSIS)
500.0000 mL | Freq: Once | INTRAVENOUS | Status: AC
  Administered 2016-04-02: 500 mL via INTRAVENOUS

## 2016-04-02 MED ORDER — ACETAMINOPHEN 650 MG RE SUPP
650.0000 mg | Freq: Four times a day (QID) | RECTAL | Status: DC | PRN
Start: 2016-04-02 — End: 2016-04-03

## 2016-04-02 MED ORDER — ACETAMINOPHEN 325 MG PO TABS
650.0000 mg | ORAL_TABLET | Freq: Four times a day (QID) | ORAL | Status: DC | PRN
Start: 2016-04-02 — End: 2016-04-03

## 2016-04-02 MED ORDER — LATANOPROST 0.005 % OP SOLN
1.0000 [drp] | Freq: Every day | OPHTHALMIC | Status: DC
  Administered 2016-04-03: 1 [drp] via OPHTHALMIC
  Filled 2016-04-02: qty 2.5

## 2016-04-02 MED ORDER — ASPIRIN 325 MG PO TABS
325.0000 mg | ORAL_TABLET | Freq: Every day | ORAL | Status: DC
  Administered 2016-04-03: 325 mg via ORAL
  Filled 2016-04-02: qty 1

## 2016-04-02 MED ORDER — HYDROCODONE-ACETAMINOPHEN 5-325 MG PO TABS: 1.0000 | ORAL_TABLET | ORAL | Status: DC | PRN

## 2016-04-02 MED ORDER — ASPIRIN EC 325 MG PO TBEC: 325.0000 mg | DELAYED_RELEASE_TABLET | Freq: Every day | ORAL | Status: DC

## 2016-04-02 MED ORDER — SODIUM CHLORIDE 0.9 % IV SOLN
INTRAVENOUS | Status: DC
  Administered 2016-04-02: via INTRAVENOUS

## 2016-04-02 MED ORDER — POTASSIUM CHLORIDE CRYS ER 20 MEQ PO TBCR
20.0000 meq | EXTENDED_RELEASE_TABLET | Freq: Every day | ORAL | Status: DC
  Administered 2016-04-03: 20 meq via ORAL
  Filled 2016-04-02: qty 1

## 2016-04-02 MED ORDER — ONDANSETRON HCL 4 MG/2ML IJ SOLN
4.0000 mg | Freq: Once | INTRAMUSCULAR | Status: AC
  Administered 2016-04-02: 4 mg via INTRAVENOUS
  Filled 2016-04-02: qty 2

## 2016-04-02 MED ORDER — ALBUTEROL SULFATE (2.5 MG/3ML) 0.083% IN NEBU: 3.0000 mL | INHALATION_SOLUTION | Freq: Four times a day (QID) | RESPIRATORY_TRACT | Status: DC | PRN

## 2016-04-02 MED ORDER — VITAMIN C 500 MG PO TABS
500.0000 mg | ORAL_TABLET | Freq: Every day | ORAL | Status: DC
  Administered 2016-04-03: 500 mg via ORAL
  Filled 2016-04-02: qty 1

## 2016-04-02 MED ORDER — HYDROCHLOROTHIAZIDE 12.5 MG PO CAPS
12.5000 mg | ORAL_CAPSULE | Freq: Every day | ORAL | Status: DC
  Administered 2016-04-03: 12.5 mg via ORAL
  Filled 2016-04-02 (×2): qty 1

## 2016-04-02 MED ORDER — MORPHINE SULFATE (PF) 4 MG/ML IV SOLN
6.0000 mg | Freq: Once | INTRAVENOUS | Status: AC
Start: 2016-04-02 — End: 2016-04-02
  Administered 2016-04-02: 6 mg via INTRAVENOUS
  Filled 2016-04-02: qty 2

## 2016-04-02 MED ORDER — HYDROMORPHONE HCL 1 MG/ML IJ SOLN: 0.5000 mg | Freq: Four times a day (QID) | INTRAMUSCULAR | Status: DC | PRN

## 2016-04-02 MED ORDER — AMLODIPINE BESYLATE 5 MG PO TABS
5.0000 mg | ORAL_TABLET | Freq: Every day | ORAL | Status: DC
  Administered 2016-04-03: 5 mg via ORAL
  Filled 2016-04-02: qty 1

## 2016-04-02 NOTE — ED Triage Notes (Signed)
Pt states she has knee surgery Thursday. States she was using her walker this morning and attempting to turn when she fell. States EMS came and helped her get up. Complain of pain in right knee now. Dressing intact and support stocking on. Pt rates her pain 2/10 states she took her hydrocodone around 1330.

## 2016-04-02 NOTE — ED Notes (Signed)
Knee splint applied per Dr. Adelfa KohZammitts orders

## 2016-04-02 NOTE — ED Notes (Signed)
P's dentures placed in a denture cup and family member Stanton KidneyDebra is taking this pt's belongings home.

## 2016-04-02 NOTE — ED Provider Notes (Addendum)
[facility] Provider Note   CSN: [id number] Arrival date & time: 04/02/16  1357     History   Chief Complaint Chief Complaint  Patient presents with  . Knee Pain    HPI Anna Hanna is a 81 y.o. female.  Patient had her knee replacement done last week. Patient was walking with a walker and fell today. Anna Hanna states Anna Hanna is unable to get up   The history is provided by the patient.  Fall  This is a new problem. The current episode started 3 to 5 hours ago. The problem occurs constantly. The problem has been resolved. Pertinent negatives include no chest pain, no abdominal pain and no headaches. Exacerbated by: Movement of leg. Nothing relieves the symptoms. Anna Hanna has tried nothing for the symptoms. The treatment provided no relief.    Past Medical History:  Diagnosis Date  . Glaucoma   . Hypercholesteremia   . Hypertension   . Hypokalemia     Patient Active Problem List   Diagnosis Date Noted  . Osteoarthritis of right knee 03/30/2016  . Arthritis of knee, right     Past Surgical History:  Procedure Laterality Date  . ABDOMINAL HYSTERECTOMY    . CATARACT EXTRACTION    . COLONOSCOPY N/A 12/10/2013   Procedure: COLONOSCOPY;  Surgeon: West Bali, MD;  Location: [facility];  Service: Endoscopy;  Laterality: N/A;  8:30 AM    OB History    No data available       Home Medications    Prior to Admission medications   Medication Sig Start Date End Date Taking? Authorizing Provider  albuterol (PROVENTIL HFA;VENTOLIN HFA) 108 (90 Base) MCG/ACT inhaler Inhale into the lungs every 6 (six) hours as needed for wheezing or shortness of breath.   Yes Historical Provider, MD  amLODipine (NORVASC) 5 MG tablet Take 1 tablet by mouth Daily. 05/24/12  Yes Historical Provider, MD  hydrochlorothiazide (MICROZIDE) 12.5 MG capsule Take 1 capsule by mouth Daily. 05/24/12  Yes Historical Provider, MD  HYDROcodone-acetaminophen (NORCO/VICODIN) 5-325 MG tablet Take 1 tablet by  mouth every 4 (four) hours as needed for moderate pain. 04/01/16  Yes Vickki Hearing, MD  latanoprost (XALATAN) 0.005 % ophthalmic solution Place 1 drop into both eyes at bedtime.   Yes Historical Provider, MD  potassium chloride SA (K-DUR,KLOR-CON) 20 MEQ tablet Take 20 mEq by mouth Daily.  05/24/12  Yes Historical Provider, MD  vitamin C (ASCORBIC ACID) 500 MG tablet Take 500 mg by mouth daily.   Yes Historical Provider, MD  aspirin EC 325 MG EC tablet Take 1 tablet (325 mg total) by mouth daily with breakfast. 04/01/16   Vickki Hearing, MD    Family History Family History  Problem Relation Age of Onset  . Cancer Mother   . Arthritis Mother   . Hypotension Father   . Cancer Sister   . Hypotension Sister   . Colon cancer Neg Hx     Social History Social History  Substance Use Topics  . Smoking status: Current Some Day Smoker    Packs/day: 0.25    Years: 3.00    Types: Cigarettes  . Smokeless tobacco: Never Used  . Alcohol use No     Allergies   Review of patient's allergies indicates no known allergies.   Review of Systems Review of Systems  Constitutional: Negative for appetite change and fatigue.  HENT: Negative for congestion, ear discharge and sinus pressure.   Eyes: Negative for discharge.  Respiratory: Negative for cough.   Cardiovascular: Negative for chest pain.  Gastrointestinal: Negative for abdominal pain and diarrhea.  Genitourinary: Negative for frequency and hematuria.  Musculoskeletal: Negative for back pain.       Right knee pain  Skin: Negative for rash.  Neurological: Negative for seizures and headaches.  Psychiatric/Behavioral: Negative for hallucinations.     Physical Exam Updated Vital Signs BP 134/79 (BP Location: Right Arm)   Pulse 65   Temp 98.1 F (36.7 C) (Oral)   Resp 17   Ht 5\' 5"  (1.651 m)   Wt 209 lb (94.8 kg)   SpO2 100%   BMI 34.78 kg/m   Physical Exam  Constitutional: Anna Hanna is oriented to person, place, and time. Anna Hanna  appears well-developed.  HENT:  Head: Normocephalic.  Eyes: Conjunctivae and EOM are normal. No scleral icterus.  Neck: Neck supple. No thyromegaly present.  Cardiovascular: Normal rate and regular rhythm.  Exam reveals no gallop and no friction rub.   No murmur heard. Pulmonary/Chest: No stridor. Anna Hanna has no wheezes. Anna Hanna has no rales. Anna Hanna exhibits no tenderness.  Abdominal: Anna Hanna exhibits no distension. There is no tenderness. There is no rebound.  Musculoskeletal: Anna Hanna exhibits no edema.  Patient has tenderness to right thigh with swelling. Healing incision to me from knee replacement. Dorsalis pedis pulses 2+  Lymphadenopathy:    Anna Hanna has no cervical adenopathy.  Neurological: Anna Hanna is oriented to person, place, and time. Anna Hanna exhibits normal muscle tone. Coordination normal.  Skin: No rash noted. No erythema.  Psychiatric: Anna Hanna has a normal mood and affect. Her behavior is normal.     ED Treatments / Results  Labs (all labs ordered are listed, but only abnormal results are displayed) Labs Reviewed  CBC WITH DIFFERENTIAL/PLATELET - Abnormal; Notable for the following:       Result Value   WBC 14.0 (*)    RBC 3.79 (*)    Hemoglobin 9.8 (*)    HCT 30.5 (*)    MCH 25.9 (*)    Neutro Abs 11.6 (*)    All other components within normal limits  COMPREHENSIVE METABOLIC PANEL - Abnormal; Notable for the following:    Sodium 132 (*)    Glucose, Bld 132 (*)    Calcium 8.3 (*)    Albumin 3.3 (*)    ALT 13 (*)    All other components within normal limits    EKG  EKG Interpretation None       Radiology Dg Knee Complete 4 Views Right  Result Date: 04/02/2016 CLINICAL DATA:  Right knee pain after fall today. EXAM: RIGHT KNEE - COMPLETE 4+ VIEW COMPARISON:  Radiographs of March 30, 2016. FINDINGS: Status post right total knee arthroplasty. Severely displaced and comminuted fracture is seen involving the distal right femur just above the femoral prosthesis. Surgical staples remain  anteriorly. No acute abnormality seen involving the tibia or fibula. IMPRESSION: Status post recent right total knee arthroplasty. Severely displaced and comminuted fracture is seen involving the distal right femur just above the femoral prosthesis. Electronically Signed   By: Lupita Raider, M.D.   On: 04/02/2016 15:08   Dg Hip Unilat W Or Wo Pelvis 2-3 Views Right  Result Date: 04/02/2016 CLINICAL DATA:  Status post fall.  Right-sided pain. EXAM: DG HIP (WITH OR WITHOUT PELVIS) 2-3V RIGHT COMPARISON:  None. FINDINGS: No acute fracture dislocation. Mild osteoarthritis bilateral hips. Mild bilateral acetabular protrusio. IMPRESSION: 1.  No acute osseous injury of the right hip. 2. Mild  osteoarthritis of bilateral hips. Electronically Signed   By: Elige KoHetal  Patel   On: 04/02/2016 15:10    Procedures Procedures (including critical care time)  Medications Ordered in ED Medications  sodium chloride 0.9 % bolus 500 mL (500 mLs Intravenous New Bag/Given 04/02/16 1813)  ondansetron (ZOFRAN) injection 4 mg (4 mg Intravenous Given 04/02/16 2006)  morphine 4 MG/ML injection 6 mg (6 mg Intravenous Given 04/02/16 2005)     Initial Impression / Assessment and Plan / ED Course  I have reviewed the triage vital signs and the nursing notes.  Pertinent labs & imaging results that were available during my care of the patient were reviewed by me and considered in my medical decision making (see chart for details).  Clinical Course    Femur fx,  I spoke with Dr. Magnus IvanBlackman orthopedics. He stated the patient can be admitted to medicine at [facility] and Dr. Romeo AppleHarrison continue the patient in the morning  Final Clinical Impressions(s) / ED Diagnoses   Final diagnoses:  None    New Prescriptions New Prescriptions   No medications on file     Bethann BerkshireJoseph Ridley Schewe, MD 04/02/16 2135    Bethann BerkshireJoseph Milus Fritze, MD 04/02/16 2204

## 2016-04-02 NOTE — H&P (Addendum)
[facility] H&P   Patient Demographics:    Anna Hanna, is a 81 y.o. female  MRN: [id number]   DOB - Jun 17, 1935  Admit Date - 04/02/2016  Outpatient Primary MD for the patient is Robert Bellow, MD  Referring MD/NP/PA: Mariann Laster  Outpatient Specialists:     Patient coming from: home  Chief Complaint  Patient presents with  . Knee Pain      HPI:    Anna Hanna  is a 81 y.o. female, hypertension, hyperlipidemia s/p RTKA, apparently went to turn around with her walker and it tilted and she fell and landed on her right side.  Pt c/o right knee pain.  Pt presented to ED due to pain.   In ED,  Patient was found to have right femur fracture.  ED spoke with Dr. Ninfa Linden who recommended that she be admitted and be seen by Dr. Aline Brochure     Review of systems:    In addition to the HPI above, No Fever-chills, No Headache, No changes with Vision or hearing, No problems swallowing food or Liquids, No Chest pain, Cough or Shortness of Breath, No Abdominal pain, No Nausea or Vommitting, + constipation No Blood in stool or Urine, No dysuria, No new skin rashes or bruises, No new weakness, tingling, numbness in any extremity, No recent weight gain or loss, No polyuria, polydypsia or polyphagia, No significant Mental Stressors.  A full 10 point Review of Systems was done, except as stated above, all other Review of Systems were negative.   With Past History of the following :    Past Medical History:  Diagnosis Date  . Glaucoma   . Hypercholesteremia   . Hypertension   . Hypokalemia   . Status post total right knee replacement       Past Surgical History:  Procedure Laterality Date  . ABDOMINAL HYSTERECTOMY    . CATARACT EXTRACTION    . COLONOSCOPY N/A 12/10/2013   Procedure: COLONOSCOPY;  Surgeon: Danie Binder, MD;  Location: [facility];  Service: Endoscopy;   Laterality: N/A;  8:30 AM      Social History:     Social History  Substance Use Topics  . Smoking status: Current Some Day Smoker    Packs/day: 0.25    Years: 3.00    Types: Cigarettes  . Smokeless tobacco: Never Used  . Alcohol use No     Lives - at home  Mobility -     Family History :     Family History  Problem Relation Age of Onset  . Cancer Mother   . Arthritis Mother   . Hypotension Father   . Cancer Sister   . Hypotension Sister   . Colon cancer Neg Hx     Home Medications:   Prior to Admission medications   Medication Sig Start Date End Date Taking? Authorizing Provider  albuterol (  PROVENTIL HFA;VENTOLIN HFA) 108 (90 Base) MCG/ACT inhaler Inhale into the lungs every 6 (six) hours as needed for wheezing or shortness of breath.   Yes Historical Provider, MD  amLODipine (NORVASC) 5 MG tablet Take 1 tablet by mouth Daily. 05/24/12  Yes Historical Provider, MD  hydrochlorothiazide (MICROZIDE) 12.5 MG capsule Take 1 capsule by mouth Daily. 05/24/12  Yes Historical Provider, MD  HYDROcodone-acetaminophen (NORCO/VICODIN) 5-325 MG tablet Take 1 tablet by mouth every 4 (four) hours as needed for moderate pain. 04/01/16  Yes Carole Civil, MD  latanoprost (XALATAN) 0.005 % ophthalmic solution Place 1 drop into both eyes at bedtime.   Yes Historical Provider, MD  potassium chloride SA (K-DUR,KLOR-CON) 20 MEQ tablet Take 20 mEq by mouth Daily.  05/24/12  Yes Historical Provider, MD  vitamin C (ASCORBIC ACID) 500 MG tablet Take 500 mg by mouth daily.   Yes Historical Provider, MD  aspirin EC 325 MG EC tablet Take 1 tablet (325 mg total) by mouth daily with breakfast. 04/01/16   Carole Civil, MD     Allergies:    No Known Allergies   Physical Exam:   Vitals  Blood pressure 139/74, pulse 89, temperature 98.1 F (36.7 C), temperature source Oral, resp. rate 16, height _0  (1.651 m), weight 94.8 kg (209 lb), SpO2 98 %.   1. General lying in bed in NAD,     2. Normal affect and insight, Not Suicidal or Homicidal, Awake Alert, Oriented X 3.  3. No F.N deficits, ALL C.Nerves Intact, Strength 5/5 all 4 extremities, Sensation intact all 4 extremities, Plantars down going.  4. Ears and Eyes appear Normal, Conjunctivae clear, PERRLA. Moist Oral Mucosa.  5. Supple Neck, No JVD, No cervical lymphadenopathy appriciated, No Carotid Bruits.  6. Symmetrical Chest wall movement, Good air movement bilaterally, CTAB.  7. RRR, No Gallops, Rubs or Murmurs, No Parasternal Heave.  8. Positive Bowel Sounds, Abdomen Soft, No tenderness, No organomegaly appriciated,No rebound -guarding or rigidity.  9.  No Cyanosis, Normal Skin Turgor, No Skin Rash or Bruise.  10. Good muscle tone,  joints appear normal , no effusions, Normal ROM.  11. No Palpable Lymph Nodes in Neck or Axillae     Data Review:    CBC  Recent Labs Lab 03/28/16 0743 03/31/16 0625 04/01/16 0619 04/02/16 1738  WBC 5.5 8.8 9.1 14.0*  HGB 12.2 9.8* 8.9* 9.8*  HCT 38.0 30.7* 28.3* 30.5*  PLT 367 299 301 320  MCV 80.7 80.8 81.6 80.5  MCH 25.9* 25.8* 25.6* 25.9*  MCHC 32.1 31.9 31.4 32.1  RDW 15.4 15.5 15.3 15.4  LYMPHSABS 1.4  --   --  1.3  MONOABS 0.5  --   --  1.0  EOSABS 0.2  --   --  0.0  BASOSABS 0.0  --   --  0.0   ------------------------------------------------------------------------------------------------------------------  Chemistries   Recent Labs Lab 03/28/16 0743 03/31/16 0625 04/02/16 1738  NA 140 136 132*  K 3.7 3.9 4.2  CL 102 105 102  CO2 _1 GLUCOSE 107* 109* 132*  BUN _2 CREATININE 0.61 0.60 0.60  CALCIUM 9.1 8.1* 8.3*  AST  --   --  15  ALT  --   --  13*  ALKPHOS  --   --  79  BILITOT  --   --  0.6   ------------------------------------------------------------------------------------------------------------------ estimated creatinine clearance is 64.9 mL/min (by C-G formula based on SCr of 0.8  mg/dL). ------------------------------------------------------------------------------------------------------------------ No  results for input(s): TSH, T4TOTAL, T3FREE, THYROIDAB in the last 72 hours.  Invalid input(s): FREET3  Coagulation profile  Recent Labs Lab 03/28/16 0743  INR 0.97   ------------------------------------------------------------------------------------------------------------------- No results for input(s): DDIMER in the last 72 hours. -------------------------------------------------------------------------------------------------------------------  Cardiac Enzymes No results for input(s): CKMB, TROPONINI, MYOGLOBIN in the last 168 hours.  Invalid input(s): CK ------------------------------------------------------------------------------------------------------------------ No results found for: BNP   ---------------------------------------------------------------------------------------------------------------  Urinalysis No results found for: COLORURINE, APPEARANCEUR, LABSPEC, PHURINE, GLUCOSEU, HGBUR, BILIRUBINUR, KETONESUR, PROTEINUR, UROBILINOGEN, NITRITE, LEUKOCYTESUR  ----------------------------------------------------------------------------------------------------------------   Imaging Results:    Dg Knee Complete 4 Views Right  Result Date: 04/02/2016 CLINICAL DATA:  Right knee pain after fall today. EXAM: RIGHT KNEE - COMPLETE 4+ VIEW COMPARISON:  Radiographs of March 30, 2016. FINDINGS: Status post right total knee arthroplasty. Severely displaced and comminuted fracture is seen involving the distal right femur just above the femoral prosthesis. Surgical staples remain anteriorly. No acute abnormality seen involving the tibia or fibula. IMPRESSION: Status post recent right total knee arthroplasty. Severely displaced and comminuted fracture is seen involving the distal right femur just above the femoral prosthesis. Electronically Signed   By:  Marijo Conception, M.D.   On: 04/02/2016 15:08   Dg Hip Unilat W Or Wo Pelvis 2-3 Views Right  Result Date: 04/02/2016 CLINICAL DATA:  Status post fall.  Right-sided pain. EXAM: DG HIP (WITH OR WITHOUT PELVIS) 2-3V RIGHT COMPARISON:  None. FINDINGS: No acute fracture dislocation. Mild osteoarthritis bilateral hips. Mild bilateral acetabular protrusio. IMPRESSION: 1.  No acute osseous injury of the right hip. 2. Mild osteoarthritis of bilateral hips. Electronically Signed   By: Kathreen Devoid   On: 04/02/2016 15:10     Assessment & Plan:    Principal Problem:   Femur fracture ([place]) Active Problems:   Anemia   Hyponatremia   Hyperglycemia   Femur fracture, right ([place])    1.  Right distal femur fracture Orthopedic consult , appreciate input. Placed in computer. Dr. Ninfa Linden recommended medical admission to ED and that Dr. Aline Brochure see her in am.  NPO after MN  2. Anemia Check ferritin, iron, tibc, b12, folate, esr Check cbc in am  3. Hyponatremia Hydrate with ns iv  4. Hyperglycemia Check hga1c  5. Hypertension Cont current bp medications   DVT Prophylaxis -  SCDs  AM Labs Ordered, also please review Full Orders  Family Communication: Admission, patients condition and plan of care including tests being ordered have been discussed with the patient  who indicate understanding and agree with the plan and Code Status.  Code Status FULL CODE  Likely DC to  home  Condition GUARDED    Consults called: orthopedics  Admission status: inpatient  Time spent in minutes : 45 minutes   Jani Gravel M.D on 04/02/2016 at 10:29 PM  Between 7am to 7pm - Pager - [phone] After 7pm go to [email or web address] - password [facility]  [facility] - Office  [phone]

## 2016-04-03 ENCOUNTER — Telehealth: Payer: Self-pay | Admitting: Orthopedic Surgery

## 2016-04-03 DIAGNOSIS — M9711XA Periprosthetic fracture around internal prosthetic right knee joint, initial encounter: Secondary | ICD-10-CM | POA: Insufficient documentation

## 2016-04-03 LAB — COMPREHENSIVE METABOLIC PANEL
ALT: 13 U/L — ABNORMAL LOW (ref 14–54)
ANION GAP: 10 (ref 5–15)
AST: 15 U/L (ref 15–41)
Albumin: 3 g/dL — ABNORMAL LOW (ref 3.5–5.0)
Alkaline Phosphatase: 68 U/L (ref 38–126)
BUN: 13 mg/dL (ref 6–20)
CHLORIDE: 101 mmol/L (ref 101–111)
CO2: 23 mmol/L (ref 22–32)
Calcium: 8.2 mg/dL — ABNORMAL LOW (ref 8.9–10.3)
Creatinine, Ser: 0.53 mg/dL (ref 0.44–1.00)
GFR calc Af Amer: >60 mL/min (ref >60)
Glucose, Bld: 155 mg/dL — ABNORMAL HIGH (ref 65–99)
POTASSIUM: 3.9 mmol/L (ref 3.5–5.1)
Sodium: 134 mmol/L — ABNORMAL LOW (ref 135–145)
TOTAL PROTEIN: 6.6 g/dL (ref 6.5–8.1)
Total Bilirubin: 0.8 mg/dL (ref 0.3–1.2)

## 2016-04-03 LAB — CBC
HEMATOCRIT: 27.1 % — AB (ref 36.0–46.0)
HEMOGLOBIN: 8.7 g/dL — AB (ref 12.0–15.0)
MCH: 25.8 pg — ABNORMAL LOW (ref 26.0–34.0)
MCHC: 32.1 g/dL (ref 30.0–36.0)
MCV: 80.4 fL (ref 78.0–100.0)
Platelets: 342 10*3/uL (ref 150–400)
RBC: 3.37 MIL/uL — AB (ref 3.87–5.11)
RDW: 15.4 % (ref 11.5–15.5)
WBC: 10.7 10*3/uL — AB (ref 4.0–10.5)

## 2016-04-03 LAB — IRON AND TIBC
Iron: 14 ug/dL — ABNORMAL LOW (ref 28–170)
Saturation Ratios: 6 % — ABNORMAL LOW (ref 10.4–31.8)
TIBC: 227 ug/dL — AB (ref 250–450)
UIBC: 213 ug/dL

## 2016-04-03 LAB — SEDIMENTATION RATE: SED RATE: 34 mm/h — AB (ref 0–22)

## 2016-04-03 LAB — VITAMIN B12: Vitamin B-12: 1153 pg/mL — ABNORMAL HIGH (ref 180–914)

## 2016-04-03 LAB — FERRITIN: FERRITIN: 129 ng/mL (ref 11–307)

## 2016-04-03 MED ORDER — ONDANSETRON HCL 4 MG/2ML IJ SOLN
4.0000 mg | Freq: Three times a day (TID) | INTRAMUSCULAR | Status: DC | PRN
  Administered 2016-04-03: 4 mg via INTRAVENOUS
  Filled 2016-04-03 (×2): qty 2

## 2016-04-03 NOTE — Progress Notes (Signed)
[facility].

## 2016-04-03 NOTE — Progress Notes (Signed)
Patient seen and examined earlier today. Recent right TKR, fall at home with a severely displaced and comminuted fracture of the distal right femur. RN has informed me that Dr. Romeo AppleHarrison has seen the patient and is arranging [facility]. Will sign off at this time.  Peggye PittEstela Hernandez, MD [facility] Pager: [phone]

## 2016-04-03 NOTE — Care Management Note (Signed)
Case Management Note  Patient Details  Name: Anna RubensMelba J Hanna MRN: [id number] Date of Birth: Dec 26, 1934  Subjective/Objective:                  [facility]. Pt fell at home and now has fracture of her femure. Ortho consult pending. Anticipate pt will need SNF at DC. CSW is aware and will follow.   Action/Plan: No CM needs anticipated, will cont to follow.   Expected Discharge Date:    04/07/2016              Expected Discharge Plan:  Skilled Nursing Facility  In-House Referral:  Clinical Social Work  Discharge planning Services  CM Consult  Post Acute Care Choice:    Choice offered to:     DME Arranged:    DME Agency:     HH Arranged:    HH Agency:     Status of Service:  In process, will continue to follow  If discussed at Long Length of Stay Meetings, dates discussed:    Additional Comments:  Malcolm MetroChildress, Garet Hooton Demske, RN 04/03/2016, 1:08 PM

## 2016-04-03 NOTE — Discharge Summary (Signed)
Physician Discharge Summary  Patient ID: Anna RubensMelba J Schloss MRN: [id number] DOB/AGE: [birth year] 81 y.o.  Admit date: 04/02/2016 Discharge date: 04/03/2016  Admission Diagnoses: Periprosthetic femur fracture right knee  Discharge Diagnoses: Same Principal Problem:   Femur fracture (HCC) Active Problems:   Anemia   Hyponatremia   Hyperglycemia   Femur fracture, right [facility](HCC)   Discharged Condition: stable  Hospital Course: The patient was admitted on September 10 after falling at home 2 days post right total knee with [employer] posterior stabilized fixed-bearing TKA. She was placed in a knee immobilizer and given appropriate pain medication and DVT prophylaxis with compression devices  Consults: orthopedic surgery  Significant Diagnostic Studies: labs:  CBC Latest Ref Rng & Units 04/03/2016 04/02/2016 04/01/2016  WBC 4.0 - 10.5 K/uL 10.7(H) 14.0(H) 9.1  Hemoglobin 12.0 - 15.0 g/dL 4.0(J8.7(L) 8.1(X9.8(L) 8.9(L)  Hematocrit 36.0 - 46.0 % 27.1(L) 30.5(L) 28.3(L)  Platelets 150 - 400 K/uL 342 320 301    BMP Latest Ref Rng & Units 04/03/2016 04/02/2016 03/31/2016  Glucose 65 - 99 mg/dL 914(N155(H) 829(F132(H) 621(H109(H)  BUN 6 - 20 mg/dL 13 14 9   Creatinine 0.44 - 1.00 mg/dL 0.860.53 5.780.60 4.690.60  Sodium 135 - 145 mmol/L 134(L) 132(L) 136  Potassium 3.5 - 5.1 mmol/L 3.9 4.2 3.9  Chloride 101 - 111 mmol/L 101 102 105  CO2 22 - 32 mmol/L 23 25 25   Calcium 8.9 - 10.3 mg/dL 8.2(L) 8.3(L) 8.1(L)    Discharge Exam: Blood pressure 140/67, pulse (!) 117, temperature 98.8 F (37.1 C), temperature source Oral, resp. rate 20, height 5\' 2"  (1.575 m), weight 210 lb 8.6 oz (95.5 kg), SpO2 99 %.  Mild deformity right femur. Normal sensation right leg 1+ dorsalis pedis pulse Normal reflexes in extension dorsiflexion right foot  Disposition transfer to Dr Golden PopHalverson   Discharge Instructions    Diet - low sodium heart healthy    Complete by:  As directed   Increase activity slowly    Complete by:  As directed       Medication List     TAKE these medications   albuterol 108 (90 Base) MCG/ACT inhaler Commonly known as:  PROVENTIL HFA;VENTOLIN HFA Inhale into the lungs every 6 (six) hours as needed for wheezing or shortness of breath.   amLODipine 5 MG tablet Commonly known as:  NORVASC Take 1 tablet by mouth Daily.   aspirin 325 MG EC tablet Take 1 tablet (325 mg total) by mouth daily with breakfast.   hydrochlorothiazide 12.5 MG capsule Commonly known as:  MICROZIDE Take 1 capsule by mouth Daily.   HYDROcodone-acetaminophen 5-325 MG tablet Commonly known as:  NORCO/VICODIN Take 1 tablet by mouth every 4 (four) hours as needed for moderate pain.   latanoprost 0.005 % ophthalmic solution Commonly known as:  XALATAN Place 1 drop into both eyes at bedtime.   potassium chloride SA 20 MEQ tablet Commonly known as:  K-DUR,KLOR-CON Take 20 mEq by mouth Daily.   vitamin C 500 MG tablet Commonly known as:  ASCORBIC ACID Take 500 mg by mouth daily.        Signed: Fuller CanadaStanley Carney Saxton 04/03/2016, 3:05 PM 3:05 PM

## 2016-04-03 NOTE — Care Management Important Message (Signed)
Important Message  Patient Details  Name: Anna Hanna MRN: [id number] Date of Birth: 10-29-1934   Medicare Important Message Given:  Yes    Malcolm MetroChildress, Nichollas Perusse Demske, RN 04/03/2016, 1:09 PM

## 2016-04-03 NOTE — Telephone Encounter (Signed)
[facility], per BryantKristy for Consult requested by hospitalist. States patient was [facility] last night, due to fall and injury to operative knee - she is status/post total knee replacement surgery 03/30/16.  Ph# for Wilkie AyeKristy or Wandra Mannanameka on 3rd floor is [phone] - patient is in Room 317. *  * Upon entering this note, call also received from "[facility]" physical therapist Sandy SalaamJoey Hayes, relaying that upon his visiting patient for home therapy yesterday, 04/02/16, patient had fallen and was unable to ambulate; therefore, he called [phone] for her to be [facility].

## 2016-04-04 ENCOUNTER — Encounter (HOSPITAL_COMMUNITY): Payer: Self-pay | Admitting: Orthopedic Surgery

## 2016-04-04 LAB — TYPE AND SCREEN
ABO/RH(D): B POS
ANTIBODY SCREEN: NEGATIVE
Unit division: 0
Unit division: 0

## 2016-04-04 LAB — FOLATE RBC
FOLATE, RBC: 1213 ng/mL (ref >498)
Folate, Hemolysate: 320.2 ng/mL
HEMATOCRIT: 26.4 % — AB (ref 34.0–46.6)

## 2016-04-04 NOTE — Care Management Note (Signed)
Case Management Note  Patient Details  Name: Anna RubensMelba J Hanna MRN: [id number] Date of Birth: [birth year]  Expected Discharge Date:    04/03/2016              Expected Discharge Plan:  Acute to Acute Transfer  In-House Referral:  Clinical Social Work  Discharge planning Services  CM Consult  Post Acute Care Choice:    Choice offered to:     DME Arranged:    DME Agency:     HH Arranged:    HH Agency:     Status of Service:  Completed, signed off  If discussed at [employer] Length of [employer] Meetings, dates discussed:    Additional Comments: Pt transferred to another facility for surgical intervention.   Malcolm Metrohildress, Tommie Dejoseph Demske, RN 04/04/2016, 2:34 PM

## 2016-04-04 NOTE — Consult Note (Addendum)
Chief complaint pain right knee  CONSULT:Requested by Dr. Jerline Pain 11, 2017  HPI: Right total knee September 7, [place] fixed-bearing posterior stabilized total knee. Discharged on Saturday, September 9.  Fell at home could not ambulate brought in for evaluation found to have periprosthetic right femur fracture.c/o severe pain and  Deformity.  Review of Systems  Constitutional: Negative for chills, fever, malaise/fatigue and weight loss.  Respiratory: Negative for shortness of breath.   Cardiovascular: Negative for chest pain.  Musculoskeletal: Positive for joint pain.  Neurological: Negative for tingling.  All other systems reviewed and are negative.  /PMH Past Surgical History:  Procedure Laterality Date  . ABDOMINAL HYSTERECTOMY    . CATARACT EXTRACTION    . COLONOSCOPY N/A 12/10/2013   Procedure: COLONOSCOPY;  Surgeon: West Bali, MD;  [facility];  Service: Endoscopy;  Laterality: N/A;  8:30 AM  . TOTAL KNEE ARTHROPLASTY Right 03/30/2016   Procedure: TOTAL KNEE ARTHROPLASTY;  Surgeon: Vickki Hearing, MD;  Location: [facility] ORS;  Service: Orthopedics;  Laterality: Right;   Family History  Problem Relation Age of Onset  . Cancer Mother   . Arthritis Mother   . Hypotension Father   . Cancer Sister   . Hypotension Sister   . Colon cancer Neg Hx    Social History  Substance Use Topics  . Smoking status: Current Some Day Smoker    Packs/day: 0.25    Years: 3.00    Types: Cigarettes  . Smokeless tobacco: Never Used  . Alcohol use No   No current facility-administered medications for this encounter.   Current Outpatient Prescriptions:  .  albuterol (PROVENTIL HFA;VENTOLIN HFA) 108 (90 Base) MCG/ACT inhaler, Inhale into the lungs every 6 (six) hours as needed for wheezing or shortness of breath., Disp: , Rfl:  .  amLODipine (NORVASC) 5 MG tablet, Take 1 tablet by mouth Daily., Disp: , Rfl:  .  hydrochlorothiazide (MICROZIDE) 12.5 MG capsule, Take 1  capsule by mouth Daily., Disp: , Rfl:  .  latanoprost (XALATAN) 0.005 % ophthalmic solution, Place 1 drop into both eyes at bedtime., Disp: , Rfl:  .  potassium chloride SA (K-DUR,KLOR-CON) 20 MEQ tablet, Take 20 mEq by mouth Daily. , Disp: , Rfl:  .  vitamin C (ASCORBIC ACID) 500 MG tablet, Take 500 mg by mouth daily., Disp: , Rfl:  .  aspirin EC 325 MG EC tablet, Take 1 tablet (325 mg total) by mouth daily with breakfast., Disp: 30 tablet, Rfl: 0 .  HYDROcodone-acetaminophen (NORCO/VICODIN) 5-325 MG tablet, Take 1 tablet by mouth every 4 (four) hours as needed for moderate pain., Disp: 30 tablet, Rfl: 0   BP 135/62   Pulse 85   Temp 98.1 F (36.7 C) (Oral)   Resp 20   Ht 5\' 3"  (1.6 m)   Wt 209 lb (94.8 kg)   SpO2 95%   BMI 37.02 kg/m   HPI-ABOVE   Review of Systems Review of Systems  Constitutional: Negative for chills, fever, malaise/fatigue and weight loss.  Respiratory: Negative for shortness of breath.   Cardiovascular: Negative for chest pain.  Musculoskeletal: Positive for joint pain.  Neurological: Negative for tingling.  All other systems reviewed and are negative.   Past Medical History:  Diagnosis Date  . Glaucoma   . Hypercholesteremia   . Hypertension   . Hypokalemia   . Status post total right knee replacement     Past Surgical History:  Procedure Laterality Date  . ABDOMINAL HYSTERECTOMY    .  CATARACT EXTRACTION    . COLONOSCOPY N/A 12/10/2013   Procedure: COLONOSCOPY;  Surgeon: West BaliSandi L Fields, MD;  [facility];  Service: Endoscopy;  Laterality: N/A;  8:30 AM  . TOTAL KNEE ARTHROPLASTY Right 03/30/2016   Procedure: TOTAL KNEE ARTHROPLASTY;  Surgeon: Vickki HearingStanley E Kimani Hovis, MD;  Location: [facility];  Service: Orthopedics;  Laterality: Right;     Family History  Problem Relation Age of Onset  . Cancer Mother   . Arthritis Mother   . Hypotension Father   . Cancer Sister   . Hypotension Sister   . Colon cancer Neg Hx    The patient was quizzed  about their family history and reported no history of bleeding problems or anesthesia problems in their family  Social History  Substance Use Topics  . Smoking status: Current Some Day Smoker    Packs/day: 0.25    Years: 3.00    Types: Cigarettes  . Smokeless tobacco: Never Used  . Alcohol use No    No Known Allergies  Current Meds  Medication Sig  . albuterol (PROVENTIL HFA;VENTOLIN HFA) 108 (90 Base) MCG/ACT inhaler Inhale into the lungs every 6 (six) hours as needed for wheezing or shortness of breath.  Marland Kitchen. amLODipine (NORVASC) 5 MG tablet Take 1 tablet by mouth Daily.  . hydrochlorothiazide (MICROZIDE) 12.5 MG capsule Take 1 capsule by mouth Daily.  Marland Kitchen. latanoprost (XALATAN) 0.005 % ophthalmic solution Place 1 drop into both eyes at bedtime.  . potassium chloride SA (K-DUR,KLOR-CON) 20 MEQ tablet Take 20 mEq by mouth Daily.   . vitamin C (ASCORBIC ACID) 500 MG tablet Take 500 mg by mouth daily.  . [DISCONTINUED] timolol (TIMOPTIC) 0.5 % ophthalmic solution Place 1 drop into both eyes Daily.    PHYSICAL EXAM   CONSTITUTIONAL:  BP 135/62   Pulse 85   Temp 98.1 F (36.7 C) (Oral)   Resp 20   Ht 5\' 3"  (1.6 m)   Wt 209 lb (94.8 kg)   SpO2 95%   BMI 37.02 kg/m  Development grooming hygiene normal. Body habitus: LARGE   EYES The conjunctiva show no injection or irritation in the eyelids are normal Pupils and irises are reactive to light and accommodation with normal size and symmetry  EARS   External inspection of ears and nose show no evidence of scars lesions or  masses in canal externally is clear  Hearing is normal to whispered voice and normal voice   NECK  no masses tracheal position is midline thyroid is not enlarged  RESPIRATORY   effort is normal and chest is non-tender  CARDIOVASCULAR   Heart location and size is normal without thrills and peripheral pulses are    normal with no peripheral edema or significant varicosities   LYMPH NODES   neck normal    supraclavicular area normal    groin normal   MUSCULOSKELETAL     Gait and station: SHE CAN NOT AMBULATE   SKIN:  Skin in subtenons tissue no rashes lesions or ulcers  No induration or nodularity  NEUROLOGIC:   DTR's were normal in the UPPER EXTREMITIES AND LEFT LOWER EXTREM The couldn't test the right secondary to recent surgery  sensation is normal in the both upper and lower extremities   PSYCHIATRIC:   Judgment and insight normal  Orientation time person and place normal  Mood affect normal  MEDICAL DECISION MAKING   DIAGNOSIS X-ray shows right periprosthetic supracondylar femur fracture  IMAGING  Reports IMPRESSION: Status post recent  right total knee arthroplasty. Severely displaced and comminuted fracture is seen involving the distal right femur just above the femoral prosthesis.   Electronically Signed   By: Lupita Raider, M.D.   On: 04/02/2016 15:08  PLAN Knee immobilizer AND TRANSFER TO [facility], I'm spoken to the doctor already The patient will require internal fixation 04/04/2016

## 2016-04-06 DIAGNOSIS — Q899 Congenital malformation, unspecified: Secondary | ICD-10-CM | POA: Insufficient documentation

## 2016-04-11 ENCOUNTER — Inpatient Hospital Stay
Admission: RE | Admit: 2016-04-11 | Discharge: 2016-05-05 | Disposition: A | Discharge status: Nursing Home (Includes ICF) | Payer: Medicare HMO | Source: Ambulatory Visit | Attending: Internal Medicine | Admitting: Internal Medicine

## 2016-04-11 DIAGNOSIS — I10 Essential (primary) hypertension: Secondary | ICD-10-CM | POA: Insufficient documentation

## 2016-04-12 ENCOUNTER — Non-Acute Institutional Stay: Payer: Medicare HMO | Admitting: Internal Medicine

## 2016-04-12 ENCOUNTER — Encounter: Payer: Self-pay | Admitting: Internal Medicine

## 2016-04-12 DIAGNOSIS — D649 Anemia, unspecified: Secondary | ICD-10-CM

## 2016-04-12 DIAGNOSIS — I10 Essential (primary) hypertension: Secondary | ICD-10-CM | POA: Diagnosis not present

## 2016-04-12 DIAGNOSIS — E871 Hypo-osmolality and hyponatremia: Secondary | ICD-10-CM | POA: Diagnosis not present

## 2016-04-12 DIAGNOSIS — M9711XS Periprosthetic fracture around internal prosthetic right knee joint, sequela: Secondary | ICD-10-CM

## 2016-04-12 NOTE — Progress Notes (Signed)
Provider:  Einar CrowAnjali,Gupta Location:   [facility] Room Number: 150/P Place of Service:  SNF (31)  PCP: Milana ObeyStephen D Knowlton, MD Patient Care Team: Gareth MorganSteve Knowlton, MD as PCP - General [facility](Family Medicine)  Extended Emergency Contact Information Primary Emergency Contact: Calla KicksKing,Evelyn          [place], [place] [address] [place] Home Phone: [phone] Relation: Relative Secondary Emergency Contact: Corky CraftsKing,Juanita          [address], [place] [address] [place] Home Phone: [phone] Relation: Relative  Code Status: Full Code Goals of Care: Advanced Directive information Advanced Directives 04/12/2016  Does patient have an advance directive? Yes  Type of Advance Directive (No Data)  Does patient want to make changes to advanced directive? No - Patient declined  Copy of advanced directive(s) in chart? Yes  Would patient like information on creating an advanced directive? -  Pre-existing out of facility DNR order (yellow form or pink MOST form) -      Chief Complaint  Patient presents with  . New Admit To SNF    HPI: Patient is a 81 y.o. female seen today for admission to SNF for rehab after revision of her knee replacement. Patient had fall at home after Right total knee replacement. [facility] Right total knee Femoral and tibial components. Post op patient developed Anemia secondary to blood loss requiring transfusion. She also had hyponatremia due to HCTZ which was stopped and Norvasc was started for her Blood pressure.  Past Medical History:  Diagnosis Date  . Glaucoma   . Hypercholesteremia   . Hypertension   . Hypokalemia   . Status post total right knee replacement    Past Surgical History:  Procedure Laterality Date  . ABDOMINAL HYSTERECTOMY    . CATARACT EXTRACTION    . COLONOSCOPY N/A 12/10/2013   Procedure: COLONOSCOPY;  Surgeon: West BaliSandi L Fields, MD;  [facility];  Service: Endoscopy;   Laterality: N/A;  8:30 AM  . TOTAL KNEE ARTHROPLASTY Right 03/30/2016   Procedure: TOTAL KNEE ARTHROPLASTY;  Surgeon: Vickki HearingStanley E Harrison, MD;  Location: [facility]S;  Service: Orthopedics;  Laterality: Right;    reports that she has been smoking Cigarettes.  She has a 0.75 pack-year smoking history. She has never used smokeless tobacco. She reports that she does not drink alcohol or use drugs. Social History   Social History  . Marital status: Divorced    Spouse name: N/A  . Number of children: N/A  . Years of education: N/A   Occupational History  . Not on file.   Social History Main Topics  . Smoking status: Current Some Day Smoker    Packs/day: 0.25    Years: 3.00    Types: Cigarettes  . Smokeless tobacco: Never Used  . Alcohol use No  . Drug use: No  . Sexual activity: Not on file   Other Topics Concern  . Not on file   Social History Narrative  . No narrative on file    Functional Status Survey:    Family History  Problem Relation Age of Onset  . Cancer Mother   . Arthritis Mother   . Hypotension Father   . Cancer Sister   . Hypotension Sister   . Colon cancer Neg Hx     Health Maintenance  Topic Date Due  . TETANUS/TDAP  09/19/1955  . ZOSTAVAX  09/18/1996  . DEXA SCAN  09/18/2001  . PNA vac  Low Risk Adult (1 of 2 - PCV13) 09/18/2001  . INFLUENZA VACCINE  06/23/2016 (Originally 02/22/2016)    No Known Allergies    Medication List       Accurate as of 04/12/16  8:43 AM. Always use your most recent med list.          albuterol 108 (90 Base) MCG/ACT inhaler Commonly known as:  PROVENTIL HFA;VENTOLIN HFA Inhale into the lungs every 6 (six) hours as needed for wheezing or shortness of breath.   amLODipine 5 MG tablet Commonly known as:  NORVASC Take 1 tablet by mouth Daily.   calcium citrate 950 MG tablet Commonly known as:  CALCITRATE - dosed in mg elemental calcium Take 200 mg of elemental calcium by mouth daily.   enoxaparin 40 MG/0.4ML  injection Commonly known as:  LOVENOX Inject 40 mg into the skin daily. Give  daily until 05/24/16   iron polysaccharides 150 MG capsule Commonly known as:  NIFEREX Take 150 mg by mouth daily.   latanoprost 0.005 % ophthalmic solution Commonly known as:  XALATAN Place 1 drop into both eyes at bedtime.   oxyCODONE 5 MG immediate release tablet Commonly known as:  Oxy IR/ROXICODONE Take 5 mg by mouth every 6 (six) hours as needed for severe pain.   senna-docusate 8.6-50 MG tablet Commonly known as:  Senokot-S Take 1 tablet by mouth 2 (two) times daily.   vitamin C 500 MG tablet Commonly known as:  ASCORBIC ACID Take 500 mg by mouth 2 (two) times daily.   Vitamin D (Cholecalciferol) 1000 units Tabs Give 1 tablet by mouth daily       Review of Systems  Constitutional: Negative.   Respiratory: Negative.   Cardiovascular: Negative.   Gastrointestinal: Negative.   Genitourinary: Negative.     Vitals:   04/12/16 0825  BP: 116/72  Pulse: 78  Resp: 20  Temp: 98.2 F (36.8 C)  TempSrc: Oral   There is no height or weight on file to calculate BMI. Physical Exam  Constitutional: She is oriented to person, place, and time. She appears well-developed and well-nourished.  HENT:  Head: Normocephalic.  Eyes: Pupils are equal, round, and reactive to light.  Neck: Neck supple.  Cardiovascular: Normal rate and normal heart sounds.   Pulmonary/Chest: Effort normal. No respiratory distress. She has no wheezes. She has no rales.  Abdominal: Soft. Bowel sounds are normal. She exhibits no distension. There is no tenderness. There is no rebound and no guarding.  Musculoskeletal:  Edema B/L Right more then Left.  Neurological: She is alert and oriented to person, place, and time.  Good strength in all extremities.    Labs reviewed: Basic Metabolic Panel:  Recent Labs  16/10/96 0625 04/02/16 1738 04/03/16 0550  NA 136 132* 134*  K 3.9 4.2 3.9  CL 105 102 101  CO2 25 25 23    GLUCOSE 109* 132* 155*  BUN 9 14 13   CREATININE 0.60 0.60 0.53  CALCIUM 8.1* 8.3* 8.2*   Liver Function Tests:  Recent Labs  04/02/16 1738 04/03/16 0550  AST 15 15  ALT 13* 13*  ALKPHOS 79 68  BILITOT 0.6 0.8  PROT 7.2 6.6  ALBUMIN 3.3* 3.0*   No results for input(s): LIPASE, AMYLASE in the last 8760 hours. No results for input(s): AMMONIA in the last 8760 hours. CBC:  Recent Labs  03/28/16 0743  04/01/16 0619 04/02/16 1738 04/03/16 0550  WBC 5.5  < > 9.1 14.0* 10.7*  NEUTROABS 3.4  --   --  11.6*  --   HGB 12.2  < > 8.9* 9.8* 8.7*  HCT 38.0  < > 28.3* 30.5* 27.1*  26.4*  MCV 80.7  < > 81.6 80.5 80.4  PLT 367  < > 301 320 342  < > = values in this interval not displayed. Cardiac Enzymes: No results for input(s): CKTOTAL, CKMB, CKMBINDEX, TROPONINI in the last 8760 hours. BNP: Invalid input(s): POCBNP No results found for: HGBA1C No results found for: TSH Lab Results  Component Value Date   VITAMINB12 1,153 (H) 04/03/2016   No results found for: FOLATE Lab Results  Component Value Date   IRON 14 (L) 04/03/2016   TIBC 227 (L) 04/03/2016   FERRITIN 129 04/03/2016    Imaging and Procedures obtained prior to SNF admission: Dg Knee Complete 4 Views Right  Result Date: 04/02/2016 CLINICAL DATA:  Right knee pain after fall today. EXAM: RIGHT KNEE - COMPLETE 4+ VIEW COMPARISON:  Radiographs of March 30, 2016. FINDINGS: Status post right total knee arthroplasty. Severely displaced and comminuted fracture is seen involving the distal right femur just above the femoral prosthesis. Surgical staples remain anteriorly. No acute abnormality seen involving the tibia or fibula. IMPRESSION: Status post recent right total knee arthroplasty. Severely displaced and comminuted fracture is seen involving the distal right femur just above the femoral prosthesis. Electronically Signed   By: Lupita Raider, M.D.   On: 04/02/2016 15:08   Dg Hip Unilat W Or Wo Pelvis 2-3 Views  Right  Result Date: 04/02/2016 CLINICAL DATA:  Status post fall.  Right-sided pain. EXAM: DG HIP (WITH OR WITHOUT PELVIS) 2-3V RIGHT COMPARISON:  None. FINDINGS: No acute fracture dislocation. Mild osteoarthritis bilateral hips. Mild bilateral acetabular protrusio. IMPRESSION: 1.  No acute osseous injury of the right hip. 2. Mild osteoarthritis of bilateral hips. Electronically Signed   By: Elige Ko   On: 04/02/2016 15:10    Assessment/Plan  Status Post repair of complicated fracture of Prosthetic knee.  Start Physical therapy. Patient asking for increase in pain meds. Will increase the frequency of Oxycodone to Q4 hours. On Lovenox for DVT prophlaxis  Anemia Post op. Will follow in One week. Patient on Iron Supplement  Hyponatremia   HCTZ  d'ced. Continue to monitor Sodium  Hypertension  Stable on Norvasc.   Family/ staff Communication:   Labs/tests ordered:

## 2016-04-13 ENCOUNTER — Encounter (HOSPITAL_COMMUNITY)
Admission: RE | Admit: 2016-04-13 | Discharge: 2016-04-13 | Disposition: A | Discharge status: Home / Self Care | Payer: Medicare HMO | Source: Skilled Nursing Facility | Attending: Internal Medicine | Admitting: Internal Medicine

## 2016-04-13 ENCOUNTER — Other Ambulatory Visit: Payer: Self-pay

## 2016-04-13 LAB — CBC
HEMATOCRIT: 25.5 % — AB (ref 36.0–46.0)
Hemoglobin: 8.2 g/dL — ABNORMAL LOW (ref 12.0–15.0)
MCH: 26.4 pg (ref 26.0–34.0)
MCHC: 32.2 g/dL (ref 30.0–36.0)
MCV: 82 fL (ref 78.0–100.0)
PLATELETS: 508 10*3/uL — AB (ref 150–400)
RBC: 3.11 MIL/uL — ABNORMAL LOW (ref 3.87–5.11)
RDW: 16.5 % — AB (ref 11.5–15.5)
WBC: 13.4 10*3/uL — AB (ref 4.0–10.5)

## 2016-04-13 LAB — BASIC METABOLIC PANEL
Anion gap: 8 (ref 5–15)
BUN: 12 mg/dL (ref 6–20)
CO2: 30 mmol/L (ref 22–32)
CREATININE: 0.55 mg/dL (ref 0.44–1.00)
Calcium: 9.1 mg/dL (ref 8.9–10.3)
Chloride: 95 mmol/L — ABNORMAL LOW (ref 101–111)
Glucose, Bld: 121 mg/dL — ABNORMAL HIGH (ref 65–99)
POTASSIUM: 3.1 mmol/L — AB (ref 3.5–5.1)
SODIUM: 133 mmol/L — AB (ref 135–145)

## 2016-04-13 LAB — MAGNESIUM: Magnesium: 1.5 mg/dL — ABNORMAL LOW (ref 1.7–2.4)

## 2016-04-13 MED ORDER — OXYCODONE HCL 5 MG PO TABS: 5.0000 mg | ORAL_TABLET | ORAL | 0 refills | Status: DC | PRN

## 2016-04-13 NOTE — Telephone Encounter (Signed)
RX faxed to [facility] @ [phone]. Phone number [phone]  

## 2016-04-14 ENCOUNTER — Ambulatory Visit: Payer: Medicare HMO | Admitting: Orthopedic Surgery

## 2016-04-17 ENCOUNTER — Non-Acute Institutional Stay: Payer: Medicare HMO | Admitting: Internal Medicine

## 2016-04-17 ENCOUNTER — Encounter (HOSPITAL_COMMUNITY)
Admission: RE | Admit: 2016-04-17 | Discharge: 2016-04-17 | Disposition: A | Discharge status: Home / Self Care | Payer: Medicare HMO | Source: Skilled Nursing Facility | Attending: *Deleted | Admitting: *Deleted

## 2016-04-17 DIAGNOSIS — E871 Hypo-osmolality and hyponatremia: Secondary | ICD-10-CM | POA: Diagnosis not present

## 2016-04-17 DIAGNOSIS — R600 Localized edema: Secondary | ICD-10-CM

## 2016-04-17 DIAGNOSIS — D649 Anemia, unspecified: Secondary | ICD-10-CM

## 2016-04-17 LAB — COMPREHENSIVE METABOLIC PANEL
ALT: 15 U/L (ref 14–54)
AST: 15 U/L (ref 15–41)
Albumin: 2.7 g/dL — ABNORMAL LOW (ref 3.5–5.0)
Alkaline Phosphatase: 122 U/L (ref 38–126)
Anion gap: 9 (ref 5–15)
BUN: 10 mg/dL (ref 6–20)
CHLORIDE: 96 mmol/L — AB (ref 101–111)
CO2: 29 mmol/L (ref 22–32)
CREATININE: 0.5 mg/dL (ref 0.44–1.00)
Calcium: 8.6 mg/dL — ABNORMAL LOW (ref 8.9–10.3)
Glucose, Bld: 114 mg/dL — ABNORMAL HIGH (ref 65–99)
Potassium: 3.6 mmol/L (ref 3.5–5.1)
Sodium: 134 mmol/L — ABNORMAL LOW (ref 135–145)
Total Bilirubin: 0.4 mg/dL (ref 0.3–1.2)
Total Protein: 6.1 g/dL — ABNORMAL LOW (ref 6.5–8.1)

## 2016-04-17 LAB — CBC WITH DIFFERENTIAL/PLATELET
BASOS ABS: 0.1 10*3/uL (ref 0.0–0.1)
Basophils Relative: 1 %
EOS PCT: 3 %
Eosinophils Absolute: 0.3 10*3/uL (ref 0.0–0.7)
HCT: 27.8 % — ABNORMAL LOW (ref 36.0–46.0)
Hemoglobin: 8.4 g/dL — ABNORMAL LOW (ref 12.0–15.0)
LYMPHS PCT: 14 %
Lymphs Abs: 1.5 10*3/uL (ref 0.7–4.0)
MCH: 25.8 pg — AB (ref 26.0–34.0)
MCHC: 30.2 g/dL (ref 30.0–36.0)
MCV: 85.5 fL (ref 78.0–100.0)
MONO ABS: 0.8 10*3/uL (ref 0.1–1.0)
Monocytes Relative: 8 %
Neutro Abs: 7.6 10*3/uL (ref 1.7–7.7)
Neutrophils Relative %: 74 %
PLATELETS: 466 10*3/uL — AB (ref 150–400)
RBC: 3.25 MIL/uL — AB (ref 3.87–5.11)
RDW: 16.9 % — AB (ref 11.5–15.5)
WBC: 10.3 10*3/uL (ref 4.0–10.5)

## 2016-04-17 NOTE — Progress Notes (Signed)
This is an acute visit.  Level care skilled.  Facility [facility] nursing.  Chief complaint-acute visit secondary to increased right leg edema.  History of present illness.  Patient is a 81 year old female seen today for increased right leg edema.  She is been him admitted here for rehabilitation after revision of her knee replacement on the right.  She initially had a fall at home after a right total knee replacement and was [facility] for revision of her right total knee femoral and tibial components.  She did have postop anemia secondary to blood loss and required transfusion-hemoglobin done today appears show stability at 8.4.  Her hydrochlorothiazide discontinued in the hospital secondary to hyponatremia which was 129 on hospital admission   --- at 134 today-she was started on Norvasc for blood pressure control blood pressures appear somewhat variable most recently 159/92-123/84.  Regards to edema she states that when she was on a diuretic this appeared to help  She does not complain of any shortness of breath chest pain  Recent lab last week showed a low potassium 3.1 she was started on potassium for 7 days 20 mEq a day which she will complete this week    Past Medical History:  Diagnosis Date  . Glaucoma   . Hypercholesteremia   . Hypertension   . Hypokalemia   . Status post total right knee replacement         Past Surgical History:  Procedure Laterality Date  . ABDOMINAL HYSTERECTOMY    . CATARACT EXTRACTION    . COLONOSCOPY N/A 12/10/2013   Procedure: COLONOSCOPY;  Surgeon: West BaliSandi L Fields, MD;  Location: [facility] ENDO SUITE;  Service: Endoscopy;  Laterality: N/A;  8:30 AM  . TOTAL KNEE ARTHROPLASTY Right 03/30/2016   Procedure: TOTAL KNEE ARTHROPLASTY;  Surgeon: Vickki HearingStanley E Harrison, MD;  Location: [facility] ORS;  Service: Orthopedics;  Laterality: Right;    reports that she has been smoking Cigarettes.  She has a 0.75 pack-year  smoking history. She has never used smokeless tobacco. She reports that she does not drink alcohol or use drugs. Social History   Social History  . Marital status: Divorced    Spouse name: N/A  . Number of children: N/A  . Years of education: N/A      Occupational History  . Not on file.        Social History Main Topics  . Smoking status: Current Some Day Smoker    Packs/day: 0.25    Years: 3.00    Types: Cigarettes  . Smokeless tobacco: Never Used  . Alcohol use No  . Drug use: No  . Sexual activity: Not on file       Other Topics Concern  . Not on file      Social History Narrative  . No narrative on file    Functional Status Survey:         Family History  Problem Relation Age of Onset  . Cancer Mother   . Arthritis Mother   . Hypotension Father   . Cancer Sister   . Hypotension Sister   . Colon cancer Neg Hx         Health Maintenance  Topic Date Due  . TETANUS/TDAP  09/19/1955  . ZOSTAVAX  09/18/1996  . DEXA SCAN  09/18/2001  . PNA vac Low Risk Adult (1 of 2 - PCV13) 09/18/2001  . INFLUENZA VACCINE  06/23/2016 (Originally 02/22/2016)    No Known Allergies  Medication List                      albuterol 108 (90 Base) MCG/ACT inhaler Commonly known as:  PROVENTIL HFA;VENTOLIN HFA Inhale into the lungs every 6 (six) hours as needed for wheezing or shortness of breath.  amLODipine 5 MG tablet Commonly known as:  NORVASC Take 1 tablet by mouth Daily.  calcium citrate 950 MG tablet Commonly known as:  CALCITRATE - dosed in mg elemental calcium Take 200 mg of elemental calcium by mouth daily.  enoxaparin 40 MG/0.4ML injection Commonly known as:  LOVENOX Inject 40 mg into the skin daily. Give  daily until 05/24/16  iron polysaccharides 150 MG capsule Commonly known as:  NIFEREX Take 150 mg by mouth daily.  latanoprost 0.005 % ophthalmic solution Commonly known as:  XALATAN Place 1 drop into both  eyes at bedtime.  oxyCODONE 5 MG immediate release tablet Commonly known as:  Oxy IR/ROXICODONE Take 5 mg by mouth every 6 (six) hours as needed for severe pain.  senna-docusate 8.6-50 MG tablet Commonly known as:  Senokot-S Take 1 tablet by mouth 2 (two) times daily.  vitamin C 500 MG tablet Commonly known as:  ASCORBIC ACID Take 500 mg by mouth 2 (two) times daily.  Vitamin D (Cholecalciferol) 1000 units Tabs Give 1 tablet by mouth daily    Potassium 20 mEq a day until 04/14/2016  Review of Systems   In general does not complain of any fever or chills has had some slight weight gain.  Skin does not complain of rashes or itching.  Surgical site is currently dressed on the right knee this was followed by nursing and wound care and thought to be stable.  Head ears eyes nose mouth and throat does not complain of a sore throat or visual changes.  Respiratory denies shortness of breath or cough.  Cardiac does not complaining of chest pain does have some increased right lower extremity edema.  GI is not complaining of nausea vomiting diarrhea constipation or abdominal pain.  Muscle skeletal is not complaining of any acute joint pain she does receive oxycodone when necessary apparently with relief.  Neurologic is not complaining of dizziness headache or numbness.  In psych is not complaining of overt anxiety or depressive symptoms appears to be in good spirits      She is afebrile pulse is 96 respirations 18 blood pressure variable 159/92-123/84. Physical Exam  Constitutional: She is oriented to person, place, and time. She appears well-developed and well-nourished.  HENT:  Head: Normocephalic.  Eyes: Pupils are equal, round, and reactive to light.  Neck: Neck supple.  Cardiovascular: Normal rate and normal heart sounds has one-two   plus edema on the right trace on the left pedal pulses are palpable somewhat reduced on the right secondary to edema.   Pulmonary/Chest:  Effort normal. No respiratory distress. She has no wheezes. She has no rales.  Abdominal: Soft. Bowel sounds are normal. She exhibits no distension. There is no tenderness. There is no rebound and no guarding.  Musculoskeletal:  Edema B/L Right more then Left.  Neurological: She is alert and oriented to person, place, and time.  Good strength in all extremities.    Labs reviewed: Basic Metabolic Panel:  78/29/5621.  Sodium 134 potassium 3.6 BUN 10 creatinine 0.5 albumin 2.7.  WBC 10.3 hemoglobin 8.4 platelets 460.    Recent Labs (within last 365 days)   Recent Labs  03/31/16 0625 04/02/16 1738 04/03/16 0550  NA 136 132* 134*  K 3.9 4.2 3.9  CL 105 102 101  CO2 25 25 23   GLUCOSE 109* 132* 155*  BUN 9 14 13   CREATININE 0.60 0.60 0.53  CALCIUM 8.1* 8.3* 8.2*     Liver Function Tests:  Recent Labs (within last 365 days)   Recent Labs  04/02/16 1738 04/03/16 0550  AST 15 15  ALT 13* 13*  ALKPHOS 79 68  BILITOT 0.6 0.8  PROT 7.2 6.6  ALBUMIN 3.3* 3.0*     Recent Labs (within last 365 days)  No results for input(s): LIPASE, AMYLASE in the last 8760 hours.   Recent Labs (within last 365 days)  No results for input(s): AMMONIA in the last 8760 hours.   CBC:  Recent Labs (within last 365 days)   Recent Labs  03/28/16 0743  04/01/16 0619 04/02/16 1738 04/03/16 0550  WBC 5.5  < > 9.1 14.0* 10.7*  NEUTROABS 3.4  --   --  11.6*  --   HGB 12.2  < > 8.9* 9.8* 8.7*  HCT 38.0  < > 28.3* 30.5* 27.1*  26.4*  MCV 80.7  < > 81.6 80.5 80.4  PLT 367  < > 301 320 342  < > = values in this interval not displayed.   Cardiac Enzymes: Recent Labs (within last 365 days)  No results for input(s): CKTOTAL, CKMB, CKMBINDEX, TROPONINI in the last 8760 hours.   BNP: Recent Labs (within last 365 days)  Invalid input(s): POCBNP   Recent Labs  No results found for: HGBA1C   Recent Labs  No results found for: TSH   Recent Labs       Lab Results    Component Value Date   VITAMINB12 1,153 (H) 04/03/2016     Recent Labs  No results found for: FOLATE   Recent Labs   Assessment and plan.  1 history of right leg edema-she is on Lovenox for DVT prophylaxis apparently venous Doppler done at [facility] before discharge was negative for DVT however edema is fairly significant will order an updated venous Doppler.-- Also will start low-dose hydrochlorothiazide 12.5 mg a day-we'll have to keep a close eye on her sodium  as well as her potassium she is on supplementation.  Update BMP has been ordered for Thursday, September 28.  #2 hypokalemia again this is being supplemented update BMP has been ordered for later this week.  #3 history of postop anemia required transfusion hemoglobin was 7.7 on hospital discharge and is now at 8.4 she is on iron Will continue to monitor at periodic intervals    [phone]

## 2016-04-20 ENCOUNTER — Encounter (HOSPITAL_COMMUNITY)
Admission: RE | Admit: 2016-04-20 | Discharge: 2016-04-20 | Disposition: A | Discharge status: Home / Self Care | Payer: Medicare HMO | Source: Skilled Nursing Facility | Attending: *Deleted | Admitting: *Deleted

## 2016-04-20 LAB — BASIC METABOLIC PANEL
ANION GAP: 5 (ref 5–15)
BUN: 8 mg/dL (ref 6–20)
CHLORIDE: 101 mmol/L (ref 101–111)
CO2: 29 mmol/L (ref 22–32)
Calcium: 8.5 mg/dL — ABNORMAL LOW (ref 8.9–10.3)
Creatinine, Ser: 0.43 mg/dL — ABNORMAL LOW (ref 0.44–1.00)
GFR calc non Af Amer: >60 mL/min (ref >60)
Glucose, Bld: 116 mg/dL — ABNORMAL HIGH (ref 65–99)
POTASSIUM: 4.2 mmol/L (ref 3.5–5.1)
SODIUM: 135 mmol/L (ref 135–145)

## 2016-04-20 LAB — CBC WITH DIFFERENTIAL/PLATELET
BASOS ABS: 0 10*3/uL (ref 0.0–0.1)
BASOS PCT: 0 %
Eosinophils Absolute: 0.2 10*3/uL (ref 0.0–0.7)
Eosinophils Relative: 3 %
HEMATOCRIT: 27.1 % — AB (ref 36.0–46.0)
HEMOGLOBIN: 8.4 g/dL — AB (ref 12.0–15.0)
Lymphocytes Relative: 17 %
Lymphs Abs: 1.4 10*3/uL (ref 0.7–4.0)
MCH: 26.2 pg (ref 26.0–34.0)
MCHC: 31 g/dL (ref 30.0–36.0)
MCV: 84.4 fL (ref 78.0–100.0)
MONOS PCT: 8 %
Monocytes Absolute: 0.6 10*3/uL (ref 0.1–1.0)
NEUTROS ABS: 6.1 10*3/uL (ref 1.7–7.7)
NEUTROS PCT: 72 %
Platelets: 608 10*3/uL — ABNORMAL HIGH (ref 150–400)
RBC: 3.21 MIL/uL — AB (ref 3.87–5.11)
RDW: 17 % — ABNORMAL HIGH (ref 11.5–15.5)
WBC: 8.4 10*3/uL (ref 4.0–10.5)

## 2016-04-20 LAB — MAGNESIUM: MAGNESIUM: 1.9 mg/dL (ref 1.7–2.4)

## 2016-05-05 ENCOUNTER — Non-Acute Institutional Stay (SKILLED_NURSING_FACILITY): Payer: Medicare HMO | Admitting: Internal Medicine

## 2016-05-05 ENCOUNTER — Encounter (HOSPITAL_COMMUNITY): Payer: Self-pay | Admitting: *Deleted

## 2016-05-05 ENCOUNTER — Emergency Department (HOSPITAL_COMMUNITY): Payer: Medicare HMO

## 2016-05-05 ENCOUNTER — Encounter: Payer: Self-pay | Admitting: Internal Medicine

## 2016-05-05 ENCOUNTER — Emergency Department (HOSPITAL_COMMUNITY)
Admission: EM | Admit: 2016-05-05 | Discharge: 2016-05-05 | Disposition: A | Discharge status: Home / Self Care | Payer: Medicare HMO | Attending: Emergency Medicine | Admitting: Emergency Medicine

## 2016-05-05 DIAGNOSIS — M9711XS Periprosthetic fracture around internal prosthetic right knee joint, sequela: Secondary | ICD-10-CM | POA: Diagnosis not present

## 2016-05-05 DIAGNOSIS — I10 Essential (primary) hypertension: Secondary | ICD-10-CM | POA: Diagnosis not present

## 2016-05-05 DIAGNOSIS — F17210 Nicotine dependence, cigarettes, uncomplicated: Secondary | ICD-10-CM | POA: Insufficient documentation

## 2016-05-05 DIAGNOSIS — Z79899 Other long term (current) drug therapy: Secondary | ICD-10-CM | POA: Diagnosis not present

## 2016-05-05 DIAGNOSIS — L03115 Cellulitis of right lower limb: Secondary | ICD-10-CM | POA: Diagnosis not present

## 2016-05-05 DIAGNOSIS — M25561 Pain in right knee: Secondary | ICD-10-CM | POA: Diagnosis present

## 2016-05-05 DIAGNOSIS — D649 Anemia, unspecified: Secondary | ICD-10-CM

## 2016-05-05 DIAGNOSIS — T1490XA Injury, unspecified, initial encounter: Secondary | ICD-10-CM

## 2016-05-05 DIAGNOSIS — E871 Hypo-osmolality and hyponatremia: Secondary | ICD-10-CM

## 2016-05-05 LAB — CBC WITH DIFFERENTIAL/PLATELET
Basophils Absolute: 0 10*3/uL (ref 0.0–0.1)
Basophils Relative: 0 %
EOS ABS: 0.1 10*3/uL (ref 0.0–0.7)
Eosinophils Relative: 1 %
HEMATOCRIT: 32.5 % — AB (ref 36.0–46.0)
HEMOGLOBIN: 10.2 g/dL — AB (ref 12.0–15.0)
LYMPHS ABS: 2.4 10*3/uL (ref 0.7–4.0)
LYMPHS PCT: 26 %
MCH: 25.8 pg — AB (ref 26.0–34.0)
MCHC: 31.4 g/dL (ref 30.0–36.0)
MCV: 82.1 fL (ref 78.0–100.0)
MONOS PCT: 10 %
Monocytes Absolute: 1 10*3/uL (ref 0.1–1.0)
NEUTROS ABS: 5.8 10*3/uL (ref 1.7–7.7)
NEUTROS PCT: 63 %
Platelets: 433 10*3/uL — ABNORMAL HIGH (ref 150–400)
RBC: 3.96 MIL/uL (ref 3.87–5.11)
RDW: 15.7 % — ABNORMAL HIGH (ref 11.5–15.5)
WBC: 9.2 10*3/uL (ref 4.0–10.5)

## 2016-05-05 LAB — BASIC METABOLIC PANEL
Anion gap: 11 (ref 5–15)
BUN: 10 mg/dL (ref 6–20)
CHLORIDE: 96 mmol/L — AB (ref 101–111)
CO2: 27 mmol/L (ref 22–32)
Calcium: 9 mg/dL (ref 8.9–10.3)
Creatinine, Ser: 0.63 mg/dL (ref 0.44–1.00)
GFR calc Af Amer: >60 mL/min (ref >60)
GFR calc non Af Amer: >60 mL/min (ref >60)
Glucose, Bld: 110 mg/dL — ABNORMAL HIGH (ref 65–99)
POTASSIUM: 3.9 mmol/L (ref 3.5–5.1)
SODIUM: 134 mmol/L — AB (ref 135–145)

## 2016-05-05 LAB — LACTIC ACID, PLASMA: Lactic Acid, Venous: 1.1 mmol/L (ref 0.5–1.9)

## 2016-05-05 MED ORDER — CLINDAMYCIN HCL 150 MG PO CAPS
450.0000 mg | ORAL_CAPSULE | Freq: Once | ORAL | Status: AC
  Administered 2016-05-05: 450 mg via ORAL
  Filled 2016-05-05: qty 3

## 2016-05-05 MED ORDER — CLINDAMYCIN HCL 150 MG PO CAPS: ORAL_CAPSULE | ORAL | 0 refills | Status: DC

## 2016-05-05 NOTE — ED Provider Notes (Signed)
[facility]-EM[facility] Provider Note   CSN: [id number] Arrival date & time: 05/05/16  1603     History   Chief Complaint Chief Complaint  Patient presents with  . Knee Pain    HPI Anna Hanna is a 81 y.o. female.  HPI  Pt was seen at 1745. Per NH report and pt, c/o gradual onset and persistence of constant right knee "pain" for the past 1 week. Pain began after she fell on it 1 week ago. Has been associated with increased swelling, warmth, and redness. Pt denies any more recent falls. Denies focal motor weakness, no tingling/numbness in extremities, no fevers.   Past Medical History:  Diagnosis Date  . Glaucoma   . Hypercholesteremia   . Hypertension   . Hypokalemia   . Status post total right knee replacement     Patient Active Problem List   Diagnosis Date Noted  . Hypertension 04/11/2016  . 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 Anemia 04/02/2016  . Hyponatremia 04/02/2016  . Hyperglycemia 04/02/2016  . Femur fracture, right (HCC) 04/02/2016  . Osteoarthritis of right knee 03/30/2016  . Arthritis of knee, right     Past Surgical History:  Procedure Laterality Date  . ABDOMINAL HYSTERECTOMY    . CATARACT EXTRACTION    . COLONOSCOPY N/A 12/10/2013   Procedure: COLONOSCOPY;  Surgeon: West BaliSandi L Fields, MD;  Location: [facility] END[facility] SUITE;  Service: Endoscopy;  Laterality: N/A;  8:30 AM  . TOTAL KNEE ARTHROPLASTY Right 03/30/2016   Procedure: TOTAL KNEE ARTHROPLASTY;  Surgeon: Vickki HearingStanley E Harrison, MD;  Location: [facility] ORS;  Service: Orthopedics;  Laterality: Right;    OB History    No data available       Home Medications    Prior to Admission medications   Medication Sig Start Date End Date Taking? Authorizing Provider  acetaminophen (TYLENOL) 500 MG tablet Take 1,000 mg by mouth every 6 (six) hours as needed for mild pain or moderate pain.     Historical Provider, MD  amLODipine (NORVASC) 5 MG  tablet Take 1 tablet by mouth Daily. 05/24/12   Historical Provider, MD  calcium citrate (CALCITRATE - DOSED IN MG ELEMENTAL CALCIUM) 950 MG tablet Take 200 mg of elemental calcium by mouth daily.    Historical Provider, MD  enoxaparin (LOVENOX) 40 MG/0.4ML injection Inject 40 mg into the skin daily. Give  daily until 05/24/16    Historical Provider, MD  hydrochlorothiazide (MICROZIDE) 12.5 MG capsule Take 12.5 mg by mouth daily.    Historical Provider, MD  iron polysaccharides (NIFEREX) 150 MG capsule Take 150 mg by mouth daily.    Historical Provider, MD  latanoprost (XALATAN) 0.005 % ophthalmic solution Place 1 drop into both eyes at bedtime.    Historical Provider, MD  Menthol, Topical Analgesic, (BIOFREEZE) 4 % GEL To be used after therapy    Historical Provider, MD  oxyCODONE (OXY IR/ROXICODONE) 5 MG immediate release tablet Take 1 tablet (5 mg total) by mouth every 4 (four) hours as needed for severe pain. 04/13/16   Tiffany L Reed, DO  polyethylene glycol (MIRALAX / GLYCOLAX) packet Take 17 g by mouth daily.    Historical Provider, MD  senna-docusate (SENOKOT-S) 8.6-50 MG tablet Take 1 tablet by mouth 2 (two) times daily.    Historical Provider, MD  vitamin C (ASCORBIC ACID) 500 MG tablet Take 500 mg by mouth 2 (two) times daily.     Historical Provider, MD  Vitamin D, Cholecalciferol, 1000 units TABS Give 1 tablet by mouth daily    Historical Provider, MD    Family History Family History  Problem Relation Age of Onset  . Cancer Mother   . Arthritis Mother   . Hypotension Father   . Cancer Sister   . Hypotension Sister   . Colon cancer Neg Hx     Social History Social History  Substance Use Topics  . Smoking status: Current Some Day Smoker    Packs/day: 0.25    Years: 3.00    Types: Cigarettes  . Smokeless tobacco: Never Used  . Alcohol use No     Allergies   Review of patient's allergies indicates no known allergies.   Review of Systems Review of Systems ROS:  Statement: All systems negative except as marked or noted in the HPI; Constitutional: Negative for fever and chills. ; ; Eyes: Negative for eye pain, redness and discharge. ; ; ENMT: Negative for ear pain, hoarseness, nasal congestion, sinus pressure and sore throat. ; ; Cardiovascular: Negative for chest pain, palpitations, diaphoresis, dyspnea and peripheral edema. ; ; Respiratory: Negative for cough, wheezing and stridor. ; ; Gastrointestinal: Negative for nausea, vomiting, diarrhea, abdominal pain, blood in stool, hematemesis, jaundice and rectal bleeding. . ; ; Genitourinary: Negative for dysuria, flank pain and hematuria. ; ; Musculoskeletal: +right knee pain, swelling, warmth, redness. Negative for back pain and neck pain. Negative for new trauma.; ; Skin: Negative for pruritus, abrasions, blisters, bruising and skin lesion.; ; Neuro: Negative for headache, lightheadedness and neck stiffness. Negative for weakness, altered level of consciousness, altered mental status, extremity weakness, paresthesias, involuntary movement, seizure and syncope.       Physical Exam Updated Vital Signs BP 126/72 (BP Location: Left Arm)   Pulse 103   Temp 98.7 F (37.1 C) (Oral)   Resp 16   Ht 5\' 5"  (1.651 m)   Wt 208 lb (94.3 kg)   SpO2 100%   BMI 34.61 kg/m   Physical Exam 1750: Physical examination:  Nursing notes reviewed; Vital signs and O2 SAT reviewed;  Constitutional: Well developed, Well nourished, Well hydrated, In no acute distress; Head:  Normocephalic, atraumatic; Eyes: EOMI, PERRL, No scleral icterus; ENMT: Mouth and pharynx normal, Mucous membranes moist; Neck: Supple, Full range of motion, No lymphadenopathy; Cardiovascular: Regular rate and rhythm, No gallop; Respiratory: Breath sounds clear & equal bilaterally, No wheezes.  Speaking full sentences with ease, Normal respiratory effort/excursion; Chest: Nontender, Movement normal; Abdomen: Soft, Nontender, Nondistended, Normal bowel sounds;  Genitourinary: No CVA tenderness; Extremities: Pulses normal, No deformity. +right knee edema, warmth, redness laterally; surgical wound with edges well approximated, no drainage, no open wounds..; Neuro: AA&Ox3, Major CN grossly intact.  Speech clear. No gross focal motor or sensory deficits in extremities.; Skin: Color normal, Warm, Dry.   ED Treatments / Results  Labs (all labs ordered are listed, but only abnormal results are displayed)   EKG  EKG Interpretation None       Radiology   Procedures Procedures (including critical care time)  Medications Ordered in ED Medications - No data to display   Initial Impression / Assessment and Plan / ED Course  I have reviewed the triage vital signs and the nursing notes.  Pertinent labs & imaging results that were available during my care of the patient were reviewed by me and considered in my medical decision making (see chart for details).  MDM Reviewed: previous chart, nursing note and vitals Reviewed previous: labs Interpretation:  labs and x-ray    Results for orders placed or performed during the hospital encounter of 05/05/16  Lactic acid, plasma  Result Value Ref Range   Lactic Acid, Venous 1.1 0.5 - 1.9 mmol/L  CBC with Differential  Result Value Ref Range   WBC 9.2 4.0 - 10.5 K/uL   RBC 3.96 3.87 - 5.11 MIL/uL   Hemoglobin 10.2 (L) 12.0 - 15.0 g/dL   HCT 16.1 (L) 09.6 - 04.5 %   MCV 82.1 78.0 - 100.0 fL   MCH 25.8 (L) 26.0 - 34.0 pg   MCHC 31.4 30.0 - 36.0 g/dL   RDW 40.9 (H) 81.1 - 91.4 %   Platelets 433 (H) 150 - 400 K/uL   Neutrophils Relative % 63 %   Neutro Abs 5.8 1.7 - 7.7 K/uL   Lymphocytes Relative 26 %   Lymphs Abs 2.4 0.7 - 4.0 K/uL   Monocytes Relative 10 %   Monocytes Absolute 1.0 0.1 - 1.0 K/uL   Eosinophils Relative 1 %   Eosinophils Absolute 0.1 0.0 - 0.7 K/uL   Basophils Relative 0 %   Basophils Absolute 0.0 0.0 - 0.1 K/uL  Basic metabolic panel  Result Value Ref Range   Sodium 134  (L) 135 - 145 mmol/L   Potassium 3.9 3.5 - 5.1 mmol/L   Chloride 96 (L) 101 - 111 mmol/L   CO2 27 22 - 32 mmol/L   Glucose, Bld 110 (H) 65 - 99 mg/dL   BUN 10 6 - 20 mg/dL   Creatinine, Ser 7.82 0.44 - 1.00 mg/dL   Calcium 9.0 8.9 - 95.6 mg/dL   GFR calc non Af Amer >60 >60 mL/min   GFR calc Af Amer >60 >60 mL/min   Anion gap 11 5 - 15   Dg Tibia/fibula Right Result Date: 05/05/2016 CLINICAL DATA:  Right knee pain EXAM: RIGHT TIBIA AND FIBULA - 2 VIEW COMPARISON:  05/05/2016, 04/02/2016 FINDINGS: The patient is status post right total knee arthroplasty. No acute fracture or malalignment. No evidence for fracture involving distal tibia or fibula. Tibial hardware appears intact. IMPRESSION: Total right knee replacement without acute osseous abnormality. Electronically Signed   By: Jasmine Pang M.D.   On: 05/05/2016 18:14   Dg Knee Complete 4 Views Right Result Date: 05/05/2016 CLINICAL DATA:  Right total knee arthroplasty 03/30/2016 complicated by periprosthetic fracture of the distal right femur 04/02/2016 status post revision of right total knee arthroplasty, status post repeat fall 04/30/2016. EXAM: RIGHT KNEE - COMPLETE 4+ VIEW COMPARISON:  04/02/2016 right knee radiographs. FINDINGS: Patient is status post interval long stem right total knee arthroplasty revision, with incomplete visualization of the stem portions of the right distal femoral and right proximal tibial prostheses. No evidence of hardware fracture or loosening in the visualized portions. No acute osseous fracture on these views. There is soft tissue swelling throughout the right knee. No suspicious focal osseous lesion. IMPRESSION: Interval long stem right total knee arthroplasty revision. No evidence of hardware complication in the visualized portions of the right knee hardware, noting incomplete visualization of the stem portions of the right distal femoral and right proximal tibial prostheses. No acute osseous fracture on these  views. Consider dedicated right femur and right tibia/fibula radiographs for complete evaluation. Electronically Signed   By: Delbert Phenix M.D.   On: 05/05/2016 17:15   Dg Femur 1v Right Result Date: 05/05/2016 CLINICAL DATA:  Right knee pain, history of surgery with fall EXAM: RIGHT FEMUR 1 VIEW COMPARISON:  05/05/2016,  04/02/2016 FINDINGS: [facility] view of the right femur. The right femoral head is normally positioned. The patient is status post total right knee replacement. Hardware appears intact. No fracture identified. IMPRESSION: Status post right knee replacement. No fracture visualized within the proximal right femur. Electronically Signed   By: Jasmine Pang M.D.   On: 05/05/2016 18:15    2010:  Workup reassuring. Appears to be mild cellulitis. Pt wants to go back to NH now.  T/C to [facility] Ortho Dr. Jana Half, case discussed, including:  HPI, pertinent PM/SHx, VS/PE, dx testing, ED course and treatment:  Knows pt well, states OK to start PO clindamycin, send back to NH, her office with call NH for f/u. Dx and testing, as well as d/w Ortho MD, d/w pt and family.  Questions answered.  Verb understanding, agreeable to d/c back to NH with outpt f/u.     Final Clinical Impressions(s) / ED Diagnoses   Final diagnoses:  Injury    New Prescriptions New Prescriptions   No medications on file     Samuel Jester, DO 05/09/16 1930

## 2016-05-05 NOTE — Progress Notes (Signed)
.  Location:   [facility] Room Number: 150/P Place of Service:  SNF (31) Provider:  Beatrice LecherArlo Ethyn Schetter  Stephen D Knowlton, MD  Patient Care Team: Gareth MorganSteve Knowlton, MD as PCP - General [facility](Family Medicine)  Extended Emergency Contact Information Primary Emergency Contact: Calla KicksKing,Evelyn          [place], [place] [address] [place] Home Phone: [phone] Relation: Relative Secondary Emergency Contact: King,Juanita          [place], [place] [address] [place] Home Phone: [phone] Relation: Relative  Code Status:  Full Code Goals of care: Advanced Directive information Advanced Directives 05/05/2016  Does patient have an advance directive? Yes  Type of Advance Directive (No Data)  Does patient want to make changes to advanced directive? No - Patient declined  Copy of advanced directive(s) in chart? Yes  Would patient like information on creating an advanced directive? -  Pre-existing out of facility DNR order (yellow form or pink MOST form) -     Chief Complaint  Patient presents with  . Acute Visit     Right knee issues    HPI:  Pt is a 81 y.o. female seen today for an acute visit forComplaints of increased right knee pain and warmth. She has a very complicated history in this regards-she is here for rehabilitation after revision of her right knee replacement.  She apparently had a fall at home after her right total knee replacement and [facility] the right total knee femoral and tibial components.  She did develop postop anemia secondary to blood loss with client transfusion she also had hyponatremia thought secondary to hydrochlorothiazide which was stopped Norvasc was started for blood pressure.  She recently had increased edema in low dose hydrochlorothiazide was re-instigated at most recent sodium actually showed slight improvement  In the last couple days she has  reported increased pain of her right knee-she also feels has increased warmth  nursing staff has noted this as well  She also had postop anemia after her operation this is being supplemented with iron-most recent hemoglobin was 8.4 on 04/20/2016      Past Medical History:  Diagnosis Date  . Glaucoma   . Hypercholesteremia   . Hypertension   . Hypokalemia   . Status post total right knee replacement    Past Surgical History:  Procedure Laterality Date  . ABDOMINAL HYSTERECTOMY    . CATARACT EXTRACTION    . COLONOSCOPY N/A 12/10/2013   Procedure: COLONOSCOPY;  Surgeon: West BaliSandi L Fields, MD;  Location: [facility] ENDO SUITE;  Service: Endoscopy;  Laterality: N/A;  8:30 AM  . TOTAL KNEE ARTHROPLASTY Right 03/30/2016   Procedure: TOTAL KNEE ARTHROPLASTY;  Surgeon: Vickki HearingStanley E Harrison, MD;  Location: [facility] ORS;  Service: Orthopedics;  Laterality: Right;    No Known Allergies  Current Outpatient Prescriptions on File Prior to Visit  Medication Sig Dispense Refill  . amLODipine (NORVASC) 5 MG tablet Take 1 tablet by mouth Daily.    . calcium citrate (CALCITRATE - DOSED IN MG ELEMENTAL CALCIUM) 950 MG tablet Take 200 mg of elemental calcium by mouth daily.    Marland Kitchen. enoxaparin (LOVENOX) 40 MG/0.4ML injection Inject 40 mg into the skin daily. Give  daily until 05/24/16    . iron polysaccharides (NIFEREX) 150 MG capsule Take 150 mg by mouth daily.    Marland Kitchen. latanoprost (XALATAN) 0.005 % ophthalmic solution Place 1 drop into both eyes at  bedtime.    Marland Kitchen oxyCODONE (OXY IR/ROXICODONE) 5 MG immediate release tablet Take 1 tablet (5 mg total) by mouth every 4 (four) hours as needed for severe pain. 180 tablet 0  . senna-docusate (SENOKOT-S) 8.6-50 MG tablet Take 1 tablet by mouth 2 (two) times daily.    . vitamin C (ASCORBIC ACID) 500 MG tablet Take 500 mg by mouth 2 (two) times daily.     . Vitamin D, Cholecalciferol, 1000 units TABS Give 1 tablet by mouth daily     No current facility-administered medications on file  prior to visit.      Review of Systems  In general does not complain of any fever or chill appears she's lost about 6 pounds since her admission  Skin does not complain of rashes or itching.     Head ears eyes nose mouth and throat does not complain of a sore throat or visual changes.  Respiratory denies shortness of breath or cough.  Cardiac does not complaining of chest pain does have some increased right lower extremity edema. Which appears a bit better since low-dose hydrochlorothiazide was started  GI is not complaining of nausea vomiting diarrhea constipation or abdominal pain.  Muscle skeletal is complaining of right knee joint pain she does receive oxycodone when necessary apparently with some relief but she is still complaining of increased pain  Neurologic is not complaining of dizziness headache or numbness.   psych is not complaining of overt anxiety or depressive symptoms appears to be in good spirits     There is no immunization history on file for this patient. Pertinent  Health Maintenance Due  Topic Date Due  . DEXA SCAN  09/18/2001  . PNA vac Low Risk Adult (1 of 2 - PCV13) 09/18/2001  . INFLUENZA VACCINE  06/23/2016 (Originally 02/22/2016)   No flowsheet data found. Functional Status Survey:    Vitals:   05/05/16 1503  BP: 126/72  Pulse: 90  Resp: 20  Temp: 97.7 F (36.5 C)  TempSrc: Oral   There is no height or weight on file to calculate BMI. Physical Exam Constitutional: She is oriented to person, place, and time. She appears well-developedand well-nourished.  HENT:  Head: Normocephalic.  Eyes: Pupils are equal, round, and reactive to light.  Neck: Neck supple.  Cardiovascular: Normal rateand normal heart sounds has one- plus edema on the right trace on the left pedal pulses are palpable somewhat reduced on the right secondary to edema.  Pulmonary/Chest: Effort normal. No respiratory distress. She has no wheezes. She has no  rales.  Abdominal: Soft. Bowel sounds are normal. She exhibits no distension. There is no tenderness. There is no reboundand no guarding.  Musculoskeletal:  Edema B/L Right more then Left.Surgical scar right knee appears to be healing unremarkably however there is increased warmth and some erythema although therythema is not real dramatic-there is tenderness to palpation   Is able to stand with some assistance   Neurological: She is alertand oriented to person, place, and time.     Labs reviewed:  Recent Labs  04/13/16 0720 04/17/16 0830 04/20/16 0740  NA 133* 134* 135  K 3.1* 3.6 4.2  CL 95* 96* 101  CO2 30 29 29   GLUCOSE 121* 114* 116*  BUN 12 10 8   CREATININE 0.55 0.50 0.43*  CALCIUM 9.1 8.6* 8.5*  MG 1.5*  --  1.9    Recent Labs  04/02/16 1738 04/03/16 0550 04/17/16 0830  AST 15 15 15   ALT 13* 13*  15  ALKPHOS 79 68 122  BILITOT 0.6 0.8 0.4  PROT 7.2 6.6 6.1*  ALBUMIN 3.3* 3.0* 2.7*    Recent Labs  04/02/16 1738  04/13/16 0720 04/17/16 0830 04/20/16 0740  WBC 14.0*  < > 13.4* 10.3 8.4  NEUTROABS 11.6*  --   --  7.6 6.1  HGB 9.8*  < > 8.2* 8.4* 8.4*  HCT 30.5*  < > 25.5* 27.8* 27.1*  MCV 80.5  < > 82.0 85.5 84.4  PLT 320  < > 508* 466* 608*  < > = values in this interval not displayed. No results found for: TSH No results found for: HGBA1C No results found for: CHOL, HDL, LDLCALC, LDLDIRECT, TRIG, CHOLHDL  Significant Diagnostic Results in last 30 days:  No results found.  Assessment/Plan . #1-status post repair complicated fracture of prosthetic knee now with increased warmth and pain-her knee is quite involved-requiring hospitalization at [facility] this point will be aggressive and send her to the ER secondary to concerns of possible brewing infection here-  She does not appear septic at this point she has been afebrile which is reassuring.  #2 history of blood loss anemia status post surgery she is on iron  hemoglobin most recently was 8.4-I suspect she will get updated labs in the ER if not will try to obtain at facility if she returns.  #3-history hyponatremia-again low dose hydrochlorothiazide has been started secondary to edema appears to have helped some-updated labs show stable sodium but this will have to be updated as well.  [id number] note greater than 35 minutes spent assessing patient-reviewing her chart-reviewing her labs-discussing and assessing patient with nursing-and coordinating plan of care     London Sheer, [place] [phone]

## 2016-05-05 NOTE — ED Notes (Signed)
[facility] per daughter request

## 2016-05-05 NOTE — Discharge Instructions (Signed)
Take the prescription as directed.  Apply moist heat or ice to the area(s) of discomfort, for 15 minutes at a time, several times per day for the next few days.  Do not fall asleep on a heating or ice pack.  Call your regular Orthopedic doctor on Monday to schedule a follow up appointment in the next 3 days.  Return to the Emergency Department immediately if worsening.

## 2016-05-05 NOTE — ED Notes (Signed)
Daughter , Nolon NationsDebra Mahurin  [phone]

## 2016-05-05 NOTE — ED Triage Notes (Addendum)
Pt comes in for right knee pain. Pt had surgery on 03/30/16, she fell on that knee on 04/30/16. Pt has had increased pain and swelling since then. [facility].

## 2016-05-10 ENCOUNTER — Encounter: Payer: Self-pay | Admitting: Internal Medicine

## 2016-05-10 ENCOUNTER — Non-Acute Institutional Stay: Payer: Medicare HMO | Admitting: Internal Medicine

## 2016-05-10 DIAGNOSIS — D649 Anemia, unspecified: Secondary | ICD-10-CM | POA: Diagnosis not present

## 2016-05-10 DIAGNOSIS — E871 Hypo-osmolality and hyponatremia: Secondary | ICD-10-CM

## 2016-05-10 DIAGNOSIS — M9711XS Periprosthetic fracture around internal prosthetic right knee joint, sequela: Secondary | ICD-10-CM

## 2016-05-10 DIAGNOSIS — I10 Essential (primary) hypertension: Secondary | ICD-10-CM | POA: Diagnosis not present

## 2016-05-10 NOTE — Progress Notes (Signed)
Location:   [facility] Room Number: 150/P Place of Service:  SNF (31) Provider:  Beatrice Lecher, MD  Patient Care Team: Anna Morgan, MD as PCP - General [facility] Medicine)  Extended Emergency Contact Information Primary Emergency Contact: Anna Hanna, [place] [address] [place] Home Phone: [phone] Relation: Relative Secondary Emergency Contact: Anna Hanna          [place], [place] [address] [place] Home Phone: [phone] Relation: Relative  Code Status:  Full Code Goals of care: Advanced Directive information Advanced Directives 05/10/2016  Does patient have an advance directive? Yes  Type of Advance Directive (No Data)  Does patient want to make changes to advanced directive? No - Patient declined  Copy of advanced directive(s) in chart? Yes  Would patient like information on creating an advanced directive? -  Pre-existing out of facility DNR order (yellow form or pink MOST form) -     Chief Complaint  Patient presents with  . Acute Visit    With possible discharge    HPI:  Pt is a 81 y.o. female seen today for an acute visit forEvaluation for possible discharge.  Patient is actually scheduled for discharge later this week there are concerns that she will be home alone.  She has a complicated history of a periprosthetic knee replacement.  She had a fall at home after her right total knee replacement [facility] right total knee femoral and tibial components.  Her participation with therapy apparently has been a challenge per discussion with nursing and therapy.  I did witness her use a walker to walk across the therapy room today with close supervision by the therapist.  Apparently she has been discharged from therapy secondary to not progressing and not fully cooperating.  Her daughter is in the room today  but she works during the day and would not be able to be home that time-patient states she has contacted some people about posse staying with her but it looks like there is no firm commitment here.  Patient did after surgery develop also postop anemia secondary to blood loss she did get a transfusion and her hemoglobin has improved up to 10.2 on most recent lab on 05/05/2016.  She also had some increased lower extremity edema and I did restart her on low-dose hydro chlorothiazide at a lower dose-she had been on previously but this was discontinued in the hospital secondary to low sodiums Nonetheless her sodium appears to be stable at 134 on lab done also October 17.  Currently she is not complaining of any pain she actually is completing a course of clindamycin after an ER visit Last week secondary to concerns of increased warmth of her knee.  This appears to be stabilized she is followed by orthopedics Dr. Romeo Apple here as well as [facility].         Past Medical History:  Diagnosis Date  . Glaucoma   . Hypercholesteremia   . Hypertension   . Hypokalemia   . Status post total right knee replacement    Past Surgical History:  Procedure Laterality Date  . ABDOMINAL HYSTERECTOMY    . CATARACT EXTRACTION    . COLONOSCOPY N/A 12/10/2013   Procedure: COLONOSCOPY;  Surgeon: West Bali, MD;  Location: [facility];  Service: Endoscopy;  Laterality: N/A;  8:30 AM  .  TOTAL KNEE ARTHROPLASTY Right 03/30/2016   Procedure: TOTAL KNEE ARTHROPLASTY;  Surgeon: Vickki HearingStanley E Harrison, MD;  Location: [facility] ORS;  Service: Orthopedics;  Laterality: Right;    No Known Allergies    Medication List       Accurate as of 05/10/16  2:36 PM. Always use your most recent med list.          acetaminophen 500 MG tablet Commonly known as:  TYLENOL Take 1,000 mg by mouth every 6 (six) hours as needed for mild pain or moderate pain.   amLODipine 5 MG tablet Commonly known as:  NORVASC Take 1  tablet by mouth Daily.   BIOFREEZE 4 % Gel Generic drug:  Menthol (Topical Analgesic) Apply 1 application topically daily. To be used after therapy   calcium citrate 950 MG tablet Commonly known as:  CALCITRATE - dosed in mg elemental calcium Take 200 mg of elemental calcium by mouth daily.   clindamycin 150 MG capsule Commonly known as:  CLEOCIN 3 tabs PO TID x 10 days   enoxaparin 40 MG/0.4ML injection Commonly known as:  LOVENOX Inject 40 mg into the skin daily. Give  daily until 05/24/16   hydrochlorothiazide 12.5 MG capsule Commonly known as:  MICROZIDE Take 12.5 mg by mouth daily.   iron polysaccharides 150 MG capsule Commonly known as:  NIFEREX Take 150 mg by mouth daily.   latanoprost 0.005 % ophthalmic solution Commonly known as:  XALATAN Place 1 drop into both eyes at bedtime.   oxyCODONE 5 MG immediate release tablet Commonly known as:  Oxy IR/ROXICODONE Take 1 tablet (5 mg total) by mouth every 4 (four) hours as needed for severe pain.   polyethylene glycol packet Commonly known as:  MIRALAX / GLYCOLAX Take 17 g by mouth daily as needed for mild constipation or moderate constipation.   senna-docusate 8.6-50 MG tablet Commonly known as:  Senokot-S Take 1 tablet by mouth 2 (two) times daily.   vitamin C 500 MG tablet Commonly known as:  ASCORBIC ACID Take 500 mg by mouth 2 (two) times daily.   Vitamin D (Cholecalciferol) 1000 units Tabs Give 1 tablet by mouth daily       Review of Systems   In general she is not complaining of any fever or chills.  Skin surgical site appears to have well-healed scarring she is being treated for suspected cellulitis but this appears stabilized.  Head ears eyes nose mouth and throat does not complaining any sore throat or visual changes.  Respiratory is not complaining of shortness breath or cough.  Cardiac no chest pain edema appears to be stabilized.  GI is not complaining of any nausea vomiting diarrhea  constipation or abdominal discomfort.  GU is not complaining of dysuria.  Muscle skeletal does complain at times of fried and right knee pain but this appears improved from when I saw her last week she does receive oxycodone as needed.  Neurologic is not complaining of dizziness headache syncope or numbness.  Psych appears to be in relatively good spirits.  Does not complain of depression or overt anxiety   There is no immunization history on file for this patient. Pertinent  Health Maintenance Due  Topic Date Due  . DEXA SCAN  09/18/2001  . PNA vac Low Risk Adult (1 of 2 - PCV13) 09/18/2001  . INFLUENZA VACCINE  06/23/2016 (Originally 02/22/2016)   No flowsheet data found. Functional Status Survey:    Vitals:   05/10/16 1424  BP: (!) 126/58  Pulse:  76  Resp: 20  Temp: 97.9 F (36.6 C)  TempSrc: Oral   There is no height or weight on file to calculate BMI. Physical Exam Constitutional: She is oriented to person, place, and time. She appears well-developedand well-nourished Her skin is warm and dry right knee has what appears to be baseline scarring I do not see any drainage or bleeding there is some mild erythema but this does not appear very dramatic there is some warmth but this appears actually somewhat improved compared to presentation last week she is not really complaining of acute pain when area is palpated.  HENT:  Head: Normocephalic.  Eyes: Pupils are equal, round, and reactive to light. She has prescription lenses Neck: Neck supple.  Cardiovascular: Normal rateand normal heart sounds has one-   plus edema on the right trace on the left pedal pulses are palpable somewhat reduced on the right secondary to edema.  Pulmonary/Chest: Effort normal. No respiratory distress. She has no wheezes. She has no rales.  Abdominal: Soft. Bowel sounds are normal. She exhibits no distension. There is no tenderness. There is no reboundand no guarding.  Musculoskeletal:  Edema  B/L Right more then Left.As stated above she is able to stand but is quite weak is able to ambulate with a walker but has to be watched pretty closely  Neurological: She is alertand oriented to person, place, and time.  Good strength in all extremities although has some weakness when trying to ambulate and get out of the chair.   Labs reviewed:  Recent Labs  04/13/16 0720 04/17/16 0830 04/20/16 0740 05/05/16 1819  NA 133* 134* 135 134*  K 3.1* 3.6 4.2 3.9  CL 95* 96* 101 96*  CO2 30 29 29 27   GLUCOSE 121* 114* 116* 110*  BUN 12 10 8 10   CREATININE 0.55 0.50 0.43* 0.63  CALCIUM 9.1 8.6* 8.5* 9.0  MG 1.5*  --  1.9  --     Recent Labs  04/02/16 1738 04/03/16 0550 04/17/16 0830  AST 15 15 15   ALT 13* 13* 15  ALKPHOS 79 68 122  BILITOT 0.6 0.8 0.4  PROT 7.2 6.6 6.1*  ALBUMIN 3.3* 3.0* 2.7*    Recent Labs  04/17/16 0830 04/20/16 0740 05/05/16 1819  WBC 10.3 8.4 9.2  NEUTROABS 7.6 6.1 5.8  HGB 8.4* 8.4* 10.2*  HCT 27.8* 27.1* 32.5*  MCV 85.5 84.4 82.1  PLT 466* 608* 433*   No results found for: TSH No results found for: HGBA1C No results found for: CHOL, HDL, LDLCALC, LDLDIRECT, TRIG, CHOLHDL  Significant Diagnostic Results in last 30 days:  Dg Tibia/fibula Right  Result Date: 05/05/2016 CLINICAL DATA:  Right knee pain EXAM: RIGHT TIBIA AND FIBULA - 2 VIEW COMPARISON:  05/05/2016, 04/02/2016 FINDINGS: The patient is status post right total knee arthroplasty. No acute fracture or malalignment. No evidence for fracture involving distal tibia or fibula. Tibial hardware appears intact. IMPRESSION: Total right knee replacement without acute osseous abnormality. Electronically Signed   By: Jasmine Pang M.D.   On: 05/05/2016 18:14   Dg Knee Complete 4 Views Right  Result Date: 05/05/2016 CLINICAL DATA:  Right total knee arthroplasty 03/30/2016 complicated by periprosthetic fracture of the distal right femur 04/02/2016 status post revision of right total knee  arthroplasty, status post repeat fall 04/30/2016. EXAM: RIGHT KNEE - COMPLETE 4+ VIEW COMPARISON:  04/02/2016 right knee radiographs. FINDINGS: Patient is status post interval long stem right total knee arthroplasty revision, with incomplete visualization of the stem  portions of the right distal femoral and right proximal tibial prostheses. No evidence of hardware fracture or loosening in the visualized portions. No acute osseous fracture on these views. There is soft tissue swelling throughout the right knee. No suspicious focal osseous lesion. IMPRESSION: Interval long stem right total knee arthroplasty revision. No evidence of hardware complication in the visualized portions of the right knee hardware, noting incomplete visualization of the stem portions of the right distal femoral and right proximal tibial prostheses. No acute osseous fracture on these views. Consider dedicated right femur and right tibia/fibula radiographs for complete evaluation. Electronically Signed   By: Delbert Phenix M.D.   On: 05/05/2016 17:15   Dg Femur 1v Right  Result Date: 05/05/2016 CLINICAL DATA:  Right knee pain, history of surgery with fall EXAM: RIGHT FEMUR 1 VIEW COMPARISON:  05/05/2016, 04/02/2016 FINDINGS: AP view of the right femur. The right femoral head is normally positioned. The patient is status post total right knee replacement. Hardware appears intact. No fracture identified. IMPRESSION: Status post right knee replacement. No fracture visualized within the proximal right femur. Electronically Signed   By: Jasmine Pang M.D.   On: 05/05/2016 18:15    Assessment/Plan #1 history of complicated periprostatic right knee repaired-as noted she is followed closely by orthopedics follow-up has been arranged-she is receiving oxycodone as needed for pain apparently this is helping.  She continues on Lovenox for DVT prophylaxis.  Again there are concerns with her ambulatory status and going home alone-I certainly share  that concern-at this point will hold discharge nursing staff has talked with family is well and they are discussing situation with social work about the best way to go forward here-I think the main concern here is to make sure there is someone at home with her.  #2 history of anemia postop she is on iron hemoglobin appears to be rising now over 10-if she goes home will need monitor training by primary care but this is going in the right direction.  #3 history of hypernatremia-again sodium appears to be stabilized despite being restarted on the hydrochlorothiazide we will update a BMP however to make sure this continues to be stable.  I also note she had at one point some hypokalemia which was supplemented for a short course will update this as well last potassium was 3.9 on 05/05/2016.  #4-history of hypertension this appears stable on Norvasc recent blood pressures 128/78-103/71-126/58 - [id number]-of note greater than 40 minutes spent assessing patient-reviewing her chart-reviewing her labs-discussing her status with nursing staff-her daughter-and   therapy-and coordinating plan of care-of note greater than 50% of time spent coordinating plan of care with input as noted above     Anna Hanna, [place] [phone]

## 2016-05-11 ENCOUNTER — Encounter: Payer: Self-pay | Admitting: Internal Medicine

## 2016-05-11 ENCOUNTER — Encounter (HOSPITAL_COMMUNITY)
Admission: AD | Admit: 2016-05-11 | Discharge: 2016-05-11 | Disposition: A | Discharge status: Home / Self Care | Payer: Medicare HMO | Source: Skilled Nursing Facility | Attending: Internal Medicine | Admitting: Internal Medicine

## 2016-05-11 ENCOUNTER — Non-Acute Institutional Stay (SKILLED_NURSING_FACILITY): Payer: Medicare HMO | Admitting: Internal Medicine

## 2016-05-11 ENCOUNTER — Inpatient Hospital Stay
Admission: RE | Admit: 2016-05-11 | Discharge: 2016-06-03 | Disposition: A | Discharge status: Home / Self Care | Payer: Medicare HMO | Source: Ambulatory Visit | Attending: Internal Medicine | Admitting: Internal Medicine

## 2016-05-11 DIAGNOSIS — E785 Hyperlipidemia, unspecified: Secondary | ICD-10-CM | POA: Insufficient documentation

## 2016-05-11 DIAGNOSIS — M9711XS Periprosthetic fracture around internal prosthetic right knee joint, sequela: Secondary | ICD-10-CM

## 2016-05-11 DIAGNOSIS — E876 Hypokalemia: Secondary | ICD-10-CM

## 2016-05-11 DIAGNOSIS — Z471 Aftercare following joint replacement surgery: Secondary | ICD-10-CM | POA: Insufficient documentation

## 2016-05-11 DIAGNOSIS — S72401D Unspecified fracture of lower end of right femur, subsequent encounter for closed fracture with routine healing: Secondary | ICD-10-CM | POA: Diagnosis present

## 2016-05-11 LAB — BASIC METABOLIC PANEL
Anion gap: 8 (ref 5–15)
BUN: 8 mg/dL (ref 6–20)
CO2: 29 mmol/L (ref 22–32)
Calcium: 8.5 mg/dL — ABNORMAL LOW (ref 8.9–10.3)
Chloride: 97 mmol/L — ABNORMAL LOW (ref 101–111)
Creatinine, Ser: 0.47 mg/dL (ref 0.44–1.00)
GFR calc Af Amer: >60 mL/min (ref >60)
GFR calc non Af Amer: >60 mL/min (ref >60)
GLUCOSE: 105 mg/dL — AB (ref 65–99)
POTASSIUM: 2.9 mmol/L — AB (ref 3.5–5.1)
Sodium: 134 mmol/L — ABNORMAL LOW (ref 135–145)

## 2016-05-11 NOTE — Progress Notes (Signed)
Location:   [facility]   Place of Service:  (P) SNF [id number](31) Provider:  Beatrice LecherArlo Lassen  Stephen D Knowlton, MD  Patient Care Team: Gareth MorganSteve Knowlton, MD as PCP - General [facility](Family Medicine)  Extended Emergency Contact Information Primary Emergency Contact: Calla KicksKing,Evelyn          [place], [place] [address] [place] Home Phone: [phone] Relation: Relative Secondary Emergency Contact: Corky CraftsKing,Juanita          [place], [place] [address] [place] Home Phone: [phone] Relation: Relative  Code Status:  Full Code Goals of care: Advanced Directive information Advanced Directives 05/11/2016  Does patient have an advance directive? Yes  Type of Advance Directive (No Data)  Does patient want to make changes to advanced directive? No - Patient declined  Copy of advanced directive(s) in chart? Yes  Would patient like information on creating an advanced directive? -  Pre-existing out of facility DNR order (yellow form or pink MOST form) -     Chief Complaint  Patient presents with  . Acute Visit    Low Potassium     HPI:  Pt is a 81 y.o. female seen today for an acute visit for Routine lab which shows a low potassium of 2.9 on lab done today.  She recently was restarted on low-dose hydrochlorothiazide secondary to increased leg edema-this appears to have improved.  Patient was scheduled for possible discharge today but she will be staying a few days longer-apparently there are complications with making sure she is going to have someone with her at home around the clock.  She does have a history of a right total knee replacement that required transfer [facility] for revision of her right total knee femoral and tibial components.  Her cooperation with therapy also has been an issue at times.  She also had postop anemia but this appears to have stabilized with a recent hemoglobin of 10.2.  Currently she is sitting in her wheelchair  comfortably-there have been concerns with a possible infection of her right t knee she has been on clindamycin this appears to be stable as well       Past Medical History:  Diagnosis Date  . Glaucoma   . Hypercholesteremia   . Hypertension   . Hypokalemia   . Status post total right knee replacement    Past Surgical History:  Procedure Laterality Date  . ABDOMINAL HYSTERECTOMY    . CATARACT EXTRACTION    . COLONOSCOPY N/A 12/10/2013   Procedure: COLONOSCOPY;  Surgeon: West BaliSandi L Fields, MD;  Location: [facility];  Service: Endoscopy;  Laterality: N/A;  8:30 AM  . TOTAL KNEE ARTHROPLASTY Right 03/30/2016   Procedure: TOTAL KNEE ARTHROPLASTY;  Surgeon: Vickki HearingStanley E Harrison, MD;  Location: [facility] ORS;  Service: Orthopedics;  Laterality: Right;    No Known Allergies    Medication List       Accurate as of 05/11/16  1:28 PM. Always use your most recent med list.          acetaminophen 500 MG tablet Commonly known as:  TYLENOL Take 1,000 mg by mouth every 6 (six) hours as needed for mild pain or moderate pain.   amLODipine 5 MG tablet Commonly known as:  NORVASC Take 1 tablet by mouth Daily.   BIOFREEZE 4 % Gel Generic drug:  Menthol (Topical Analgesic) Apply 1 application topically daily. To be used after therapy   calcium citrate 950 MG tablet Commonly known as:  CALCITRATE - dosed in mg elemental calcium Take 200 mg of elemental calcium by mouth daily.   clindamycin 150 MG capsule Commonly known as:  CLEOCIN 3 tabs PO TID x 10 days   enoxaparin 40 MG/0.4ML injection Commonly known as:  LOVENOX Inject 40 mg into the skin daily. Give  daily until 05/24/16   hydrochlorothiazide 12.5 MG capsule Commonly known as:  MICROZIDE Take 12.5 mg by mouth daily.   iron polysaccharides 150 MG capsule Commonly known as:  NIFEREX Take 150 mg by mouth daily.   latanoprost 0.005 % ophthalmic solution Commonly known as:  XALATAN Place 1 drop into both eyes at bedtime.     oxyCODONE 5 MG immediate release tablet Commonly known as:  Oxy IR/ROXICODONE Take 1 tablet (5 mg total) by mouth every 4 (four) hours as needed for severe pain.   polyethylene glycol packet Commonly known as:  MIRALAX / GLYCOLAX Take 17 g by mouth daily as needed for mild constipation or moderate constipation.   senna-docusate 8.6-50 MG tablet Commonly known as:  Senokot-S Take 1 tablet by mouth 2 (two) times daily.   vitamin C 500 MG tablet Commonly known as:  ASCORBIC ACID Take 500 mg by mouth 2 (two) times daily.   Vitamin D (Cholecalciferol) 1000 units Tabs Give 1 tablet by mouth daily       Review of Systems In general she is not complaining of any fever or chills.  Skin surgical site appears to have well-healed scarring scar at this point does not appear to be overtly infected with increased erythema or warmth from baseline  Head ears eyes nose mouth and throat does not complaining any sore throat or visual changes.  Respiratory is not complaining of shortness breath or cough.  Cardiac no chest pain edema appears to be stabilized. She is not complaining of chest pain or palpitations  GI is not complaining of any nausea vomiting diarrhea constipation or abdominal discomfort.  GU is not complaining of dysuria.  Muscle skeletal does complain at times of ight knee pain but this appears  Improving   Neurologic is not complaining of dizziness headache syncope or numbness.  Psych can be anxious at times but appears to be in good spirits  Does not complain of depression or overt anxiety  There is no immunization history on file for this patient. Pertinent  Health Maintenance Due  Topic Date Due  . DEXA SCAN  09/18/2001  . PNA vac Low Risk Adult (1 of 2 - PCV13) 09/18/2001  . INFLUENZA VACCINE  06/23/2016 (Originally 02/22/2016)   No flowsheet data found. Functional Status Survey:      Physical Exam  She is afebrile pulse of 80 respirations of 18  blood pressure is pending Constitutional: . She appears well-developedand well-nourished Her skin is warm and dry right knee has what appears to be baseline scarring I do not see any drainage or bleeding Or increased erythema or warmth from baseline.  HENT:  Head: Normocephalic.  Eyes: Pupils are equal, round, and reactive to light. She has prescription lenses Neck: Neck supple.  Cardiovascular: Normal rateand normal heart soundshas one- plus edema on the right trace on the left pedal pulses are palpable somewhat reduced on the right secondary to edema. edema does appear improved from several days ago  Pulmonary/Chest: Effort normal. No respiratory distress. She has no wheezes. She has no rales.  Abdominal: Soft. Bowel sounds are normal. She exhibits no distension. There is no tenderness. There is no reboundand no  guarding.  Musculoskeletal:  Edema B/L Right more then Left. Neurological: She is alertand oriented to person, place, and time.  Good strength in all extremities although has some weakness when attempting to ambulate.    Labs reviewed:  Recent Labs  04/13/16 0720  04/20/16 0740 05/05/16 1819 05/11/16 0500  NA 133*  < > 135 134* 134*  K 3.1*  < > 4.2 3.9 2.9*  CL 95*  < > 101 96* 97*  CO2 30  < > 29 27 29   GLUCOSE 121*  < > 116* 110* 105*  BUN 12  < > 8 10 8   CREATININE 0.55  < > 0.43* 0.63 0.47  CALCIUM 9.1  < > 8.5* 9.0 8.5*  MG 1.5*  --  1.9  --   --   < > = values in this interval not displayed.  Recent Labs  04/02/16 1738 04/03/16 0550 04/17/16 0830  AST 15 15 15   ALT 13* 13* 15  ALKPHOS 79 68 122  BILITOT 0.6 0.8 0.4  PROT 7.2 6.6 6.1*  ALBUMIN 3.3* 3.0* 2.7*    Recent Labs  04/17/16 0830 04/20/16 0740 05/05/16 1819  WBC 10.3 8.4 9.2  NEUTROABS 7.6 6.1 5.8  HGB 8.4* 8.4* 10.2*  HCT 27.8* 27.1* 32.5*  MCV 85.5 84.4 82.1  PLT 466* 608* 433*   No results found for: TSH No results found for: HGBA1C No results found for: CHOL, HDL,  LDLCALC, LDLDIRECT, TRIG, CHOLHDL  Significant Diagnostic Results in last 30 days:  Dg Tibia/fibula Right  Result Date: 05/05/2016 CLINICAL DATA:  Right knee pain EXAM: RIGHT TIBIA AND FIBULA - 2 VIEW COMPARISON:  05/05/2016, 04/02/2016 FINDINGS: The patient is status post right total knee arthroplasty. No acute fracture or malalignment. No evidence for fracture involving distal tibia or fibula. Tibial hardware appears intact. IMPRESSION: Total right knee replacement without acute osseous abnormality. Electronically Signed   By: Jasmine Pang M.D.   On: 05/05/2016 18:14   Dg Knee Complete 4 Views Right  Result Date: 05/05/2016 CLINICAL DATA:  Right total knee arthroplasty 03/30/2016 complicated by periprosthetic fracture of the distal right femur 04/02/2016 status post revision of right total knee arthroplasty, status post repeat fall 04/30/2016. EXAM: RIGHT KNEE - COMPLETE 4+ VIEW COMPARISON:  04/02/2016 right knee radiographs. FINDINGS: Patient is status post interval long stem right total knee arthroplasty revision, with incomplete visualization of the stem portions of the right distal femoral and right proximal tibial prostheses. No evidence of hardware fracture or loosening in the visualized portions. No acute osseous fracture on these views. There is soft tissue swelling throughout the right knee. No suspicious focal osseous lesion. IMPRESSION: Interval long stem right total knee arthroplasty revision. No evidence of hardware complication in the visualized portions of the right knee hardware, noting incomplete visualization of the stem portions of the right distal femoral and right proximal tibial prostheses. No acute osseous fracture on these views. Consider dedicated right femur and right tibia/fibula radiographs for complete evaluation. Electronically Signed   By: Delbert Phenix M.D.   On: 05/05/2016 17:15   Dg Femur 1v Right  Result Date: 05/05/2016 CLINICAL DATA:  Right knee pain, history of  surgery with fall EXAM: RIGHT FEMUR 1 VIEW COMPARISON:  05/05/2016, 04/02/2016 FINDINGS: AP view of the right femur. The right femoral head is normally positioned. The patient is status post total right knee replacement. Hardware appears intact. No fracture identified. IMPRESSION: Status post right knee replacement. No fracture visualized within the proximal right  femur. Electronically Signed   By: Jasmine Pang M.D.   On: 05/05/2016 18:15    Assessment/Plan #1 hypokalemia-she is not on potassium supplementation will start this 20 meq daily for 3 days and then reduce to 20 and a daily recheck a metabolic panel first laboratory day week clinically she appears to be stable. Also will discontinue the hydrochlorothiazide since her edema appears to be improved-will monitor clinically.  #2 history of right knee replacement with complications as noted above-her discharge has been delayed temporarily secondary to concerns of there is no one with her at home 24 hours-apparently this is being worked on-clinically she appears to be stable although still continued weakness with apparently some issues with cooperating physical therapy.  Clinically she appears to be stable however.  [id number]      London Sheer, CMA [phone]

## 2016-05-15 ENCOUNTER — Encounter (HOSPITAL_COMMUNITY)
Admission: AD | Admit: 2016-05-15 | Discharge: 2016-05-15 | Disposition: A | Discharge status: Home / Self Care | Payer: Medicare HMO | Source: Skilled Nursing Facility | Attending: *Deleted | Admitting: *Deleted

## 2016-05-15 DIAGNOSIS — E785 Hyperlipidemia, unspecified: Secondary | ICD-10-CM | POA: Diagnosis not present

## 2016-05-15 LAB — BASIC METABOLIC PANEL
Anion gap: 6 (ref 5–15)
BUN: 9 mg/dL (ref 6–20)
CHLORIDE: 103 mmol/L (ref 101–111)
CO2: 27 mmol/L (ref 22–32)
CREATININE: 0.5 mg/dL (ref 0.44–1.00)
Calcium: 9.1 mg/dL (ref 8.9–10.3)
GFR calc Af Amer: >60 mL/min (ref >60)
GFR calc non Af Amer: >60 mL/min (ref >60)
GLUCOSE: 105 mg/dL — AB (ref 65–99)
Potassium: 4.1 mmol/L (ref 3.5–5.1)
SODIUM: 136 mmol/L (ref 135–145)

## 2016-05-17 DIAGNOSIS — M7051 Other bursitis of knee, right knee: Secondary | ICD-10-CM | POA: Insufficient documentation

## 2016-05-21 ENCOUNTER — Other Ambulatory Visit (HOSPITAL_COMMUNITY)
Admission: RE | Admit: 2016-05-21 | Discharge: 2016-05-21 | Disposition: A | Discharge status: Home / Self Care | Payer: Medicare HMO | Source: Skilled Nursing Facility | Attending: Internal Medicine | Admitting: Internal Medicine

## 2016-05-21 DIAGNOSIS — E785 Hyperlipidemia, unspecified: Secondary | ICD-10-CM | POA: Insufficient documentation

## 2016-05-21 LAB — BASIC METABOLIC PANEL
Anion gap: 8 (ref 5–15)
BUN: 13 mg/dL (ref 6–20)
CALCIUM: 9.3 mg/dL (ref 8.9–10.3)
CO2: 25 mmol/L (ref 22–32)
Chloride: 102 mmol/L (ref 101–111)
Creatinine, Ser: 0.72 mg/dL (ref 0.44–1.00)
GFR calc non Af Amer: >60 mL/min (ref >60)
Glucose, Bld: 96 mg/dL (ref 65–99)
Potassium: 4.9 mmol/L (ref 3.5–5.1)
SODIUM: 135 mmol/L (ref 135–145)

## 2016-05-29 ENCOUNTER — Encounter: Payer: Self-pay | Admitting: Internal Medicine

## 2016-05-29 ENCOUNTER — Non-Acute Institutional Stay (SKILLED_NURSING_FACILITY): Payer: Medicare HMO | Admitting: Internal Medicine

## 2016-05-29 DIAGNOSIS — E871 Hypo-osmolality and hyponatremia: Secondary | ICD-10-CM

## 2016-05-29 DIAGNOSIS — D649 Anemia, unspecified: Secondary | ICD-10-CM

## 2016-05-29 DIAGNOSIS — I10 Essential (primary) hypertension: Secondary | ICD-10-CM | POA: Diagnosis not present

## 2016-05-29 DIAGNOSIS — M9711XS Periprosthetic fracture around internal prosthetic right knee joint, sequela: Secondary | ICD-10-CM | POA: Diagnosis not present

## 2016-05-29 NOTE — Progress Notes (Signed)
Location:   [facility] Room Number: 154/W Place of Service:  SNF (31)  Provider: Edmon CrapeArlo,[place]  PCP: Milana ObeyStephen D Knowlton, MD Patient Care Team: Gareth MorganSteve Knowlton, MD as PCP - General [facility](Family Medicine)  Extended Emergency Contact Information Primary Emergency Contact: Calla KicksKing,Evelyn          [place], [place] [id number] [place] Home Phone: [phone] Relation: Relative Secondary Emergency Contact: King,Juanita          [place], [place] [address] [place] Home Phone: [phone] Relation: Relative  Code Status: Full Code Goals of care:  Advanced Directive information Advanced Directives 05/29/2016  Does patient have an advance directive? Yes  Type of Advance Directive (No Data)  Does patient want to make changes to advanced directive? No - Patient declined  Copy of advanced directive(s) in chart? Yes  Would patient like information on creating an advanced directive? -  Pre-existing out of facility DNR order (yellow form or pink MOST form) -     No Known Allergies  Chief Complaint  Patient presents with  . Discharge Note    HPI:       Pt is a 81 y.o. female seen todayFor discharge later this week   She has a complicated history of a periprosthetic knee replacement.  She had a fall at home after her right total knee replacement required [facility] of the right total knee femoral and tibial components.  At first therapy was quite a challenge for her but apparently she has cooperated more and progressed she will be going home she says she will have someone with her around the clock which I feel is important.        Patient did after surgery develop also postop anemia secondary to blood loss she did get a transfusion and her hemoglobin has improved up to 10.2 on most recent lab on 05/05/2016.  She also had some increased lower extremity edema and I did restart her on low-dose  hydro chlorothiazide at a lower dose-she had been on previously but this was discontinued in the hospital secondary to low sodiums Subsequently however she developed hypokalemia with potassium of 2.9-her hydrochlorothiazide has been discontinued and her potassium was supplemented she now remains on 20 meq potassium and last potassium was 4.9 on lab done October 29-we will discontinue the potassium since she is no longer on hydrochlorothiazide and potassium now seems to have normalized but we will need to update this lab tomorrow to ensure stability.  Regards  sodium was 135 on lab done on 05/21/2016 this will be updated as well.   She did go to the ER on October 13 secondary to concerns of some increase right knee pain and erythema she's completed a course of clindamycin for concerns about some developing cellulitis and apparently this is stabilized--- still at times complains of knee pain but apparently this is  better  she is followed by orthopedics Dr[facility] Romeo AppleHarrison [facility]       Past Medical History:  Diagnosis Date  . Glaucoma   . Hypercholesteremia   . Hypertension   . Hypokalemia   . Status post total right knee replacement     Past Surgical History:  Procedure Laterality Date  . ABDOMINAL HYSTERECTOMY    . CATARACT EXTRACTION    . COLONOSCOPY N/A 12/10/2013   Procedure: COLONOSCOPY;  Surgeon: West BaliSandi L Fields, MD;  [facility]: [facility];  Service:  Endoscopy;  Laterality: N/A;  8:30 AM  . TOTAL KNEE ARTHROPLASTY Right 03/30/2016   Procedure: TOTAL KNEE ARTHROPLASTY;  Surgeon: Vickki Hearing, MD;  Location: [facility] ORS;  Service: Orthopedics;  Laterality: Right;      reports that she has been smoking Cigarettes.  She has a 0.75 pack-year smoking history. She has never used smokeless tobacco. She reports that she does not drink alcohol or use drugs. Social History   Social History  . Marital status: Divorced    Spouse name: N/A  . Number of children:  N/A  . Years of education: N/A   Occupational History  . Not on file.   Social History Main Topics  . Smoking status: Current Some Day Smoker    Packs/day: 0.25    Years: 3.00    Types: Cigarettes  . Smokeless tobacco: Never Used  . Alcohol use No  . Drug use: No  . Sexual activity: Not on file   Other Topics Concern  . Not on file   Social History Narrative  . No narrative on file   Functional Status Survey:    No Known Allergies  Pertinent  Health Maintenance Due  Topic Date Due  . DEXA SCAN  09/18/2001  . PNA vac Low Risk Adult (1 of 2 - PCV13) 09/18/2001  . INFLUENZA VACCINE  06/23/2016 (Originally 02/22/2016)    Medications: Current Outpatient Prescriptions on File Prior to Visit  Medication Sig Dispense Refill  . acetaminophen (TYLENOL) 500 MG tablet Take 1,000 mg by mouth every 6 (six) hours as needed for mild pain or moderate pain.     Marland Kitchen amLODipine (NORVASC) 5 MG tablet Take 1 tablet by mouth Daily.    . calcium citrate (CALCITRATE - DOSED IN MG ELEMENTAL CALCIUM) 950 MG tablet Take 200 mg of elemental calcium by mouth daily.    . iron polysaccharides (NIFEREX) 150 MG capsule Take 150 mg by mouth daily.    Marland Kitchen latanoprost (XALATAN) 0.005 % ophthalmic solution Place 1 drop into both eyes at bedtime.    . Menthol, Topical Analgesic, (BIOFREEZE) 4 % GEL Apply 1 application topically daily. To be used after therapy     . oxyCODONE (OXY IR/ROXICODONE) 5 MG immediate release tablet Take 1 tablet (5 mg total) by mouth every 4 (four) hours as needed for severe pain. 180 tablet 0  . polyethylene glycol (MIRALAX / GLYCOLAX) packet Take 17 g by mouth daily as needed for mild constipation or moderate constipation.     . senna-docusate (SENOKOT-S) 8.6-50 MG tablet Take 1 tablet by mouth 2 (two) times daily.    . vitamin C (ASCORBIC ACID) 500 MG tablet Take 500 mg by mouth 2 (two) times daily.     . Vitamin D, Cholecalciferol, 1000 units TABS Give 1 tablet by mouth daily     No  current facility-administered medications on file prior to visit.      Review of Systems   In general she is not complaining of any fever or chills.  Skin surgical site appears to have well-healed scarring scar at this point does not appear to be overtly infected with increased erythema or warmth from baseline  Head ears eyes nose mouth and throat does not complaining any sore throat or visual changes.  Respiratory is not complaining of shortness breath or cough.  Cardiac no chest pain edema appears to be stabilized. She is not complaining of chest pain or palpitations  GI is not complaining of any nausea vomiting diarrhea constipation  or abdominal discomfort.  GU is not complaining of dysuria.  Muscle skeletal does complain at times of ight knee pain but this appears to continue to be improved    Neurologic is not complaining of dizziness headache syncope or numbness.  Psych can be anxious at times but appears to be in good spirits looking forward to going home     Vitals:   05/29/16 1414  BP: (!) 142/68  Pulse: 76  Resp: 20  Temp: 98.5 F (36.9 C)  TempSrc: Oral  Recent previous blood pressures 122/73-121/76-119/74 There is no height or weight on file to calculate BMI. Physical Exam Constitutional: . She appears well-developedand well-nourished currently ambulating in wheelchair Her skin is warm and dry right knee has what appears to be baseline scarring I do not see any drainage or bleeding Or increased erythema or warmth from baseline.  HENT:  Head: Normocephalic.  Eyes: Pupils are equal, round, and reactive to light. She has prescription lenses Neck: Neck supple.  Cardiovascular: Normal rateand normal heart soundshas one- plus edema on the right trace on the left pedal pulses are palpable somewhat reduced on the right secondary to edema.  edema appears relatively baseline   Pulmonary/Chest: Effort normal. No respiratory distress. She has no  wheezes. She has no rales.  Abdominal: Soft. Bowel sounds are normal. She exhibits no distension. There is no tenderness. There is no reboundand no guarding.  Musculoskeletal:  Edema B/L Right more then Left. Neurological: She is alertand oriented to person, place, and time. Somewhat anxious but looking forward to going home  Good strength in all extremitiesalthough has some weakness when attempting to ambulate.And would benefit from continued therapy  Labs reviewed: Basic Metabolic Panel:  Recent Labs  16/10/96 0720  04/20/16 0740  05/11/16 0500 05/15/16 0500 05/21/16 1350  NA 133*  < > 135  < > 134* 136 135  K 3.1*  < > 4.2  < > 2.9* 4.1 4.9  CL 95*  < > 101  < > 97* 103 102  CO2 30  < > 29  < > 29 27 25   GLUCOSE 121*  < > 116*  < > 105* 105* 96  BUN 12  < > 8  < > 8 9 13   CREATININE 0.55  < > 0.43*  < > 0.47 0.50 0.72  CALCIUM 9.1  < > 8.5*  < > 8.5* 9.1 9.3  MG 1.5*  --  1.9  --   --   --   --   < > = values in this interval not displayed. Liver Function Tests:  Recent Labs  04/02/16 1738 04/03/16 0550 04/17/16 0830  AST 15 15 15   ALT 13* 13* 15  ALKPHOS 79 68 122  BILITOT 0.6 0.8 0.4  PROT 7.2 6.6 6.1*  ALBUMIN 3.3* 3.0* 2.7*   No results for input(s): LIPASE, AMYLASE in the last 8760 hours. No results for input(s): AMMONIA in the last 8760 hours. CBC:  Recent Labs  04/17/16 0830 04/20/16 0740 05/05/16 1819  WBC 10.3 8.4 9.2  NEUTROABS 7.6 6.1 5.8  HGB 8.4* 8.4* 10.2*  HCT 27.8* 27.1* 32.5*  MCV 85.5 84.4 82.1  PLT 466* 608* 433*   Cardiac Enzymes: No results for input(s): CKTOTAL, CKMB, CKMBINDEX, TROPONINI in the last 8760 hours. BNP: Invalid input(s): POCBNP CBG: No results for input(s): GLUCAP in the last 8760 hours.  Procedures and Imaging Studies During Stay: Dg Tibia/fibula Right  Result Date: 05/05/2016 CLINICAL DATA:  Right knee  pain EXAM: RIGHT TIBIA AND FIBULA - 2 VIEW COMPARISON:  05/05/2016, 04/02/2016 FINDINGS: The patient  is status post right total knee arthroplasty. No acute fracture or malalignment. No evidence for fracture involving distal tibia or fibula. Tibial hardware appears intact. IMPRESSION: Total right knee replacement without acute osseous abnormality. Electronically Signed   By: Jasmine Pang M.D.   On: 05/05/2016 18:14   Dg Knee Complete 4 Views Right  Result Date: 05/05/2016 CLINICAL DATA:  Right total knee arthroplasty 03/30/2016 complicated by periprosthetic fracture of the distal right femur 04/02/2016 status post revision of right total knee arthroplasty, status post repeat fall 04/30/2016. EXAM: RIGHT KNEE - COMPLETE 4+ VIEW COMPARISON:  04/02/2016 right knee radiographs. FINDINGS: Patient is status post interval long stem right total knee arthroplasty revision, with incomplete visualization of the stem portions of the right distal femoral and right proximal tibial prostheses. No evidence of hardware fracture or loosening in the visualized portions. No acute osseous fracture on these views. There is soft tissue swelling throughout the right knee. No suspicious focal osseous lesion. IMPRESSION: Interval long stem right total knee arthroplasty revision. No evidence of hardware complication in the visualized portions of the right knee hardware, noting incomplete visualization of the stem portions of the right distal femoral and right proximal tibial prostheses. No acute osseous fracture on these views. Consider dedicated right femur and right tibia/fibula radiographs for complete evaluation. Electronically Signed   By: Delbert Phenix M.D.   On: 05/05/2016 17:15   Dg Femur 1v Right  Result Date: 05/05/2016 CLINICAL DATA:  Right knee pain, history of surgery with fall EXAM: RIGHT FEMUR 1 VIEW COMPARISON:  05/05/2016, 04/02/2016 FINDINGS: [facility] view of the right femur. The right femoral head is normally positioned. The patient is status post total right knee replacement. Hardware appears intact. No fracture  identified. IMPRESSION: Status post right knee replacement. No fracture visualized within the proximal right femur. Electronically Signed   By: Jasmine Pang M.D.   On: 05/05/2016 18:15    Assessment/Plan:    #1 history of complicated periprostatic right knee repaired-as noted she is followed closely by orthopedics follow-up has been arranged-she is receiving oxycodone as needed for pain apparently this is helping. She has completed a 6 week course of Lovenox for anticoagulation.     #2 history of anemia postop she is on iron hemoglobin appears to be rising now over 10-we will update this before discharge  #3 history of hypernatremia- Last sodium was 135 on lab done on 05/21/2016 will update this she is no longer on hydrochlorothiazide.     #4-history of hypertension this appears stable on Norvasc recent blood pressures 122/73-121/76-I see a systolic in the 140s but this does not appear to be frequent  #5-history of hypokalemia again this has been supplemented-since it is now normalized and she is not on hydrochlorothiazide will discontinue the potassium and recheck a metabolic panel tomorrow before discharge  Patient has been advised to f/u with their PCP in 1-2 weeks to bring them up to date on their rehab stay.  Social services at facility was responsible for arranging this appointment.  Pt was provided with a 30 day supply of prescriptions for medications and refills must be obtained from their PCP.  For controlled substances, a more limited supply may be provided adequate until PCP appointment only.  She will need continued PT OT as well as home health support for multiple medical issues as well as expedient primary care provider follow  [id number]-of note  greater than 30 minutes spent on this discharge summary-greater than 50% of time spent coordinating plan of care

## 2016-05-30 ENCOUNTER — Encounter (HOSPITAL_COMMUNITY)
Admission: AD | Admit: 2016-05-30 | Discharge: 2016-05-30 | Disposition: A | Discharge status: Home / Self Care | Payer: Medicare HMO | Source: Skilled Nursing Facility | Attending: Internal Medicine | Admitting: Internal Medicine

## 2016-05-30 DIAGNOSIS — X58XXXD Exposure to other specified factors, subsequent encounter: Secondary | ICD-10-CM | POA: Insufficient documentation

## 2016-05-30 DIAGNOSIS — M9711XD Periprosthetic fracture around internal prosthetic right knee joint, subsequent encounter: Secondary | ICD-10-CM | POA: Insufficient documentation

## 2016-05-30 DIAGNOSIS — Z720 Tobacco use: Secondary | ICD-10-CM | POA: Insufficient documentation

## 2016-05-30 DIAGNOSIS — S72401D Unspecified fracture of lower end of right femur, subsequent encounter for closed fracture with routine healing: Secondary | ICD-10-CM | POA: Diagnosis not present

## 2016-05-30 DIAGNOSIS — Z471 Aftercare following joint replacement surgery: Secondary | ICD-10-CM | POA: Insufficient documentation

## 2016-05-30 LAB — CBC WITH DIFFERENTIAL/PLATELET
Basophils Absolute: 0 10*3/uL (ref 0.0–0.1)
Basophils Relative: 0 %
EOS ABS: 0.2 10*3/uL (ref 0.0–0.7)
Eosinophils Relative: 3 %
HCT: 37 % (ref 36.0–46.0)
HEMOGLOBIN: 11.6 g/dL — AB (ref 12.0–15.0)
Lymphocytes Relative: 28 %
Lymphs Abs: 2.2 10*3/uL (ref 0.7–4.0)
MCH: 25.4 pg — AB (ref 26.0–34.0)
MCHC: 31.4 g/dL (ref 30.0–36.0)
MCV: 81 fL (ref 78.0–100.0)
MONOS PCT: 9 %
Monocytes Absolute: 0.7 10*3/uL (ref 0.1–1.0)
NEUTROS PCT: 60 %
Neutro Abs: 4.8 10*3/uL (ref 1.7–7.7)
Platelets: 464 10*3/uL — ABNORMAL HIGH (ref 150–400)
RBC: 4.57 MIL/uL (ref 3.87–5.11)
RDW: 16.2 % — ABNORMAL HIGH (ref 11.5–15.5)
WBC: 8 10*3/uL (ref 4.0–10.5)

## 2016-05-30 LAB — BASIC METABOLIC PANEL
Anion gap: 6 (ref 5–15)
BUN: 10 mg/dL (ref 6–20)
CHLORIDE: 102 mmol/L (ref 101–111)
CO2: 27 mmol/L (ref 22–32)
Calcium: 9.2 mg/dL (ref 8.9–10.3)
Creatinine, Ser: 0.57 mg/dL (ref 0.44–1.00)
GFR calc Af Amer: >60 mL/min (ref >60)
GFR calc non Af Amer: >60 mL/min (ref >60)
Glucose, Bld: 111 mg/dL — ABNORMAL HIGH (ref 65–99)
Potassium: 4.3 mmol/L (ref 3.5–5.1)
Sodium: 135 mmol/L (ref 135–145)

## 2016-06-07 ENCOUNTER — Telehealth: Payer: Self-pay | Admitting: *Deleted

## 2016-06-07 NOTE — Telephone Encounter (Signed)
Therapy order fine   Tylenol for pain final answer

## 2016-06-07 NOTE — Telephone Encounter (Signed)
[facility] with [facility], she states she needs a verbal order for patient to have OT once a week for 2 weeks and twice a week for 4 weeks. Also, states patient does not have anything for pain which makes it limiting for her. Please advise [phone]

## 2016-06-13 ENCOUNTER — Telehealth: Payer: Self-pay | Admitting: Orthopedic Surgery

## 2016-06-13 NOTE — Telephone Encounter (Signed)
Received call back from [facility], occupational [facility], ph# [phone] - states she did not receive the response per phone note of 06/07/16 per Dr Mort SawyersHarrison's response.  Relayed.  States she is scheduled to work with patient today, 06/13/16.

## 2016-06-21 ENCOUNTER — Ambulatory Visit: Payer: Medicare HMO | Admitting: Orthopedic Surgery

## 2016-06-23 ENCOUNTER — Ambulatory Visit (INDEPENDENT_AMBULATORY_CARE_PROVIDER_SITE_OTHER): Payer: Medicare HMO

## 2016-06-23 ENCOUNTER — Ambulatory Visit (INDEPENDENT_AMBULATORY_CARE_PROVIDER_SITE_OTHER): Payer: Medicare HMO | Admitting: Orthopedic Surgery

## 2016-06-23 VITALS — Ht 63.0 in | Wt 210.0 lb

## 2016-06-23 DIAGNOSIS — M25561 Pain in right knee: Principal | ICD-10-CM

## 2016-06-23 DIAGNOSIS — G8929 Other chronic pain: Secondary | ICD-10-CM | POA: Diagnosis not present

## 2016-06-23 DIAGNOSIS — T85898A Other specified complication of other internal prosthetic devices, implants and grafts, initial encounter: Secondary | ICD-10-CM

## 2016-06-23 DIAGNOSIS — M25861 Other specified joint disorders, right knee: Secondary | ICD-10-CM | POA: Diagnosis not present

## 2016-06-23 DIAGNOSIS — Z96651 Presence of right artificial knee joint: Secondary | ICD-10-CM

## 2016-06-23 NOTE — Progress Notes (Signed)
Patient ID: Anna RubensMelba J Hanna, female   DOB: May 05, 1935, 81 y.o.   MRN: [id number]  Chief Complaint  Patient presents with  . Leg Swelling    Leg feels warm, referred by Dr. Sudie BaileyKnowlton. DOS 03-30-16.    HPI Anna Hanna is a 81 y.o. female.  Presents for evaluation of right leg swelling and warmth  History this patient had osteoarthritis of the right knee. She underwent an uncomplicated right total knee replacement on 03/30/2016.  She went home and fell presented back to the hospital on September 10 with a supracondylar periprosthetic fracture and was [facility]  They did a long stem tibial replacement and a distal femur tumor-type prosthesis  She presents today with several days of pain and swelling in the right knee   Review of Systems Review of Systems 1. Denies fever 2. Denies shortness of breath or  3. chest pain   Past Medical History:  Diagnosis Date  . Glaucoma   . Hypercholesteremia   . Hypertension   . Hypokalemia   . Status post total right knee replacement     Past Surgical History:  Procedure Laterality Date  . ABDOMINAL HYSTERECTOMY    . CATARACT EXTRACTION    . COLONOSCOPY N/A 12/10/2013   Procedure: COLONOSCOPY;  Surgeon: West BaliSandi L Fields, MD;  [facility]: [facility];  Service: Endoscopy;  Laterality: N/A;  8:30 AM  . TOTAL KNEE ARTHROPLASTY Right 03/30/2016   Procedure: TOTAL KNEE ARTHROPLASTY;  Surgeon: Vickki HearingStanley E Khali Albanese, MD;  Location: [facility] ORS;  Service: Orthopedics;  Laterality: Right;    Social History Social History  Substance Use Topics  . Smoking status: Current Some Day Smoker    Packs/day: 0.25    Years: 3.00    Types: Cigarettes  . Smokeless tobacco: Never Used  . Alcohol use No    No Known Allergies  No outpatient prescriptions have been marked as taking for the 06/23/16 encounter (Office Visit) with Vickki HearingStanley E Rileyann Florance, MD.      Physical Exam Physical Exam Ht 5\' 3"  (1.6 m)   Wt 210 lb (95.3 kg)   BMI 37.20  kg/m   Gen. appearance. The patient is well-developed and well-nourished, grooming and hygiene are normal. There are no gross congenital abnormalities  The patient is alert and oriented to person place and time  Mood and affect are normal  Ambulation She says she can walk with a walker but I can get her knee straight so not sure how well that goes but she came in in a wheelchair  Examination reveals the following: On inspection we find the incision is healed without erythema the midline incision however there is swelling in the proximal thigh and medial knee with warmth to the joint  With the range of motion of  the can extend the knee past 45 and can't flex it past 80  Stability tests anterior posterior drawer seems stable collateral ligaments seem stable  No atrophy is seen in the quadriceps   Skin we find no rash ulceration or erythema  Sensation remains intact  Impression vascular system shows no peripheral edema  Data Reviewed X-ray today shows no significant change or loosening but there is a 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

## 2016-06-26 ENCOUNTER — Telehealth: Payer: Self-pay | Admitting: Orthopedic Surgery

## 2016-06-26 NOTE — Telephone Encounter (Signed)
Office did not request films, information was faxed this morning

## 2016-06-26 NOTE — Telephone Encounter (Signed)
Patient called, relays has been set up for referral [facility] with Dr Carolyne FiscalInman for tomorrow, 06/28/16, 10:25a.m.  States they are awaiting fax from our office. Please advise.  (Also, is patient to carry films to this appointment?) Her ph# is [phone]

## 2016-06-28 DIAGNOSIS — M25561 Pain in right knee: Secondary | ICD-10-CM | POA: Insufficient documentation

## 2016-06-28 DIAGNOSIS — S86811A Strain of other muscle(s) and tendon(s) at lower leg level, right leg, initial encounter: Secondary | ICD-10-CM | POA: Insufficient documentation

## 2016-09-30 ENCOUNTER — Encounter (HOSPITAL_COMMUNITY): Payer: Self-pay | Admitting: Emergency Medicine

## 2016-09-30 ENCOUNTER — Inpatient Hospital Stay (HOSPITAL_COMMUNITY)
Admission: EM | Admit: 2016-09-30 | Discharge: 2016-10-03 | DRG: 558 | Disposition: A | Discharge status: Skilled Nursing Facility | Payer: Medicare HMO | Attending: Internal Medicine | Admitting: Internal Medicine

## 2016-09-30 ENCOUNTER — Emergency Department (HOSPITAL_COMMUNITY): Payer: Medicare HMO

## 2016-09-30 DIAGNOSIS — Z7409 Other reduced mobility: Secondary | ICD-10-CM | POA: Diagnosis present

## 2016-09-30 DIAGNOSIS — T68XXXA Hypothermia, initial encounter: Secondary | ICD-10-CM | POA: Diagnosis not present

## 2016-09-30 DIAGNOSIS — E876 Hypokalemia: Secondary | ICD-10-CM | POA: Diagnosis present

## 2016-09-30 DIAGNOSIS — L89152 Pressure ulcer of sacral region, stage 2: Secondary | ICD-10-CM | POA: Diagnosis present

## 2016-09-30 DIAGNOSIS — I10 Essential (primary) hypertension: Secondary | ICD-10-CM | POA: Diagnosis present

## 2016-09-30 DIAGNOSIS — E785 Hyperlipidemia, unspecified: Secondary | ICD-10-CM | POA: Diagnosis present

## 2016-09-30 DIAGNOSIS — N179 Acute kidney failure, unspecified: Secondary | ICD-10-CM | POA: Diagnosis present

## 2016-09-30 DIAGNOSIS — R5381 Other malaise: Secondary | ICD-10-CM | POA: Diagnosis present

## 2016-09-30 DIAGNOSIS — Z79899 Other long term (current) drug therapy: Secondary | ICD-10-CM

## 2016-09-30 DIAGNOSIS — L89302 Pressure ulcer of unspecified buttock, stage 2: Secondary | ICD-10-CM | POA: Diagnosis present

## 2016-09-30 DIAGNOSIS — M6282 Rhabdomyolysis: Secondary | ICD-10-CM | POA: Diagnosis not present

## 2016-09-30 DIAGNOSIS — F17210 Nicotine dependence, cigarettes, uncomplicated: Secondary | ICD-10-CM | POA: Diagnosis present

## 2016-09-30 DIAGNOSIS — R748 Abnormal levels of other serum enzymes: Secondary | ICD-10-CM

## 2016-09-30 DIAGNOSIS — E7800 Pure hypercholesterolemia, unspecified: Secondary | ICD-10-CM | POA: Diagnosis present

## 2016-09-30 DIAGNOSIS — R739 Hyperglycemia, unspecified: Secondary | ICD-10-CM | POA: Diagnosis not present

## 2016-09-30 DIAGNOSIS — R296 Repeated falls: Secondary | ICD-10-CM | POA: Diagnosis present

## 2016-09-30 DIAGNOSIS — W19XXXA Unspecified fall, initial encounter: Secondary | ICD-10-CM

## 2016-09-30 DIAGNOSIS — M25561 Pain in right knee: Secondary | ICD-10-CM

## 2016-09-30 DIAGNOSIS — Z96651 Presence of right artificial knee joint: Secondary | ICD-10-CM | POA: Diagnosis present

## 2016-09-30 DIAGNOSIS — R4182 Altered mental status, unspecified: Secondary | ICD-10-CM

## 2016-09-30 DIAGNOSIS — H409 Unspecified glaucoma: Secondary | ICD-10-CM | POA: Diagnosis present

## 2016-09-30 DIAGNOSIS — G8929 Other chronic pain: Secondary | ICD-10-CM

## 2016-09-30 DIAGNOSIS — L8992 Pressure ulcer of unspecified site, stage 2: Secondary | ICD-10-CM | POA: Diagnosis present

## 2016-09-30 LAB — URINALYSIS, ROUTINE W REFLEX MICROSCOPIC
Bilirubin Urine: NEGATIVE
GLUCOSE, UA: NEGATIVE mg/dL
HGB URINE DIPSTICK: NEGATIVE
Ketones, ur: 5 mg/dL — AB
Leukocytes, UA: NEGATIVE
Nitrite: NEGATIVE
PH: 5 (ref 5.0–8.0)
Protein, ur: 30 mg/dL — AB
SPECIFIC GRAVITY, URINE: 1.018 (ref 1.005–1.030)

## 2016-09-30 LAB — CBC WITH DIFFERENTIAL/PLATELET
Basophils Absolute: 0 10*3/uL (ref 0.0–0.1)
Basophils Relative: 0 %
Eosinophils Absolute: 0 10*3/uL (ref 0.0–0.7)
Eosinophils Relative: 0 %
HEMATOCRIT: 37.7 % (ref 36.0–46.0)
Hemoglobin: 12.5 g/dL (ref 12.0–15.0)
LYMPHS ABS: 1.1 10*3/uL (ref 0.7–4.0)
LYMPHS PCT: 12 %
MCH: 25.1 pg — ABNORMAL LOW (ref 26.0–34.0)
MCHC: 33.2 g/dL (ref 30.0–36.0)
MCV: 75.6 fL — AB (ref 78.0–100.0)
MONO ABS: 0.9 10*3/uL (ref 0.1–1.0)
MONOS PCT: 9 %
NEUTROS ABS: 7.6 10*3/uL (ref 1.7–7.7)
Neutrophils Relative %: 79 %
Platelets: 451 10*3/uL — ABNORMAL HIGH (ref 150–400)
RBC: 4.99 MIL/uL (ref 3.87–5.11)
RDW: 16.1 % — AB (ref 11.5–15.5)
WBC: 9.5 10*3/uL (ref 4.0–10.5)

## 2016-09-30 LAB — COMPREHENSIVE METABOLIC PANEL
ALT: 35 U/L (ref 14–54)
AST: 80 U/L — ABNORMAL HIGH (ref 15–41)
Albumin: 3.3 g/dL — ABNORMAL LOW (ref 3.5–5.0)
Alkaline Phosphatase: 101 U/L (ref 38–126)
Anion gap: 12 (ref 5–15)
BILIRUBIN TOTAL: 0.7 mg/dL (ref 0.3–1.2)
BUN: 34 mg/dL — AB (ref 6–20)
CO2: 29 mmol/L (ref 22–32)
Calcium: 9.3 mg/dL (ref 8.9–10.3)
Chloride: 96 mmol/L — ABNORMAL LOW (ref 101–111)
Creatinine, Ser: 1.1 mg/dL — ABNORMAL HIGH (ref 0.44–1.00)
GFR calc Af Amer: 53 mL/min — ABNORMAL LOW (ref >60)
GFR, EST NON AFRICAN AMERICAN: 46 mL/min — AB (ref >60)
Glucose, Bld: 126 mg/dL — ABNORMAL HIGH (ref 65–99)
POTASSIUM: 3.1 mmol/L — AB (ref 3.5–5.1)
Sodium: 137 mmol/L (ref 135–145)
TOTAL PROTEIN: 7.4 g/dL (ref 6.5–8.1)

## 2016-09-30 LAB — MRSA PCR SCREENING: MRSA BY PCR: NEGATIVE

## 2016-09-30 LAB — CK: Total CK: 2238 U/L — ABNORMAL HIGH (ref 38–234)

## 2016-09-30 MED ORDER — ACETAMINOPHEN 650 MG RE SUPP: 650.0000 mg | Freq: Four times a day (QID) | RECTAL | Status: DC | PRN

## 2016-09-30 MED ORDER — POTASSIUM CHLORIDE ER 20 MEQ PO TBCR: 20.0000 meq | EXTENDED_RELEASE_TABLET | Freq: Every day | ORAL | Status: DC

## 2016-09-30 MED ORDER — LATANOPROST 0.005 % OP SOLN
OPHTHALMIC | Status: AC
  Filled 2016-09-30: qty 2.5

## 2016-09-30 MED ORDER — ONDANSETRON HCL 4 MG/2ML IJ SOLN: 4.0000 mg | Freq: Four times a day (QID) | INTRAMUSCULAR | Status: DC | PRN

## 2016-09-30 MED ORDER — POLYETHYLENE GLYCOL 3350 17 G PO PACK: 17.0000 g | PACK | Freq: Every day | ORAL | Status: DC | PRN

## 2016-09-30 MED ORDER — ONDANSETRON HCL 4 MG PO TABS: 4.0000 mg | ORAL_TABLET | Freq: Four times a day (QID) | ORAL | Status: DC | PRN

## 2016-09-30 MED ORDER — ACETAMINOPHEN 325 MG PO TABS: 650.0000 mg | ORAL_TABLET | Freq: Four times a day (QID) | ORAL | Status: DC | PRN

## 2016-09-30 MED ORDER — LATANOPROST 0.005 % OP SOLN
1.0000 [drp] | Freq: Every day | OPHTHALMIC | Status: DC
  Administered 2016-09-30 – 2016-10-02 (×2): 1 [drp] via OPHTHALMIC
  Filled 2016-09-30: qty 2.5

## 2016-09-30 MED ORDER — BOOST / RESOURCE BREEZE PO LIQD
1.0000 | Freq: Three times a day (TID) | ORAL | Status: DC
  Administered 2016-09-30 – 2016-10-01 (×4): 1 via ORAL

## 2016-09-30 MED ORDER — SODIUM CHLORIDE 0.9 % IV BOLUS (SEPSIS)
1000.0000 mL | Freq: Once | INTRAVENOUS | Status: AC
  Administered 2016-09-30: 1000 mL via INTRAVENOUS

## 2016-09-30 MED ORDER — POTASSIUM CHLORIDE CRYS ER 20 MEQ PO TBCR
20.0000 meq | EXTENDED_RELEASE_TABLET | Freq: Once | ORAL | Status: DC
  Filled 2016-09-30: qty 1

## 2016-09-30 MED ORDER — SODIUM CHLORIDE 0.9 % IV SOLN
INTRAVENOUS | Status: DC
  Administered 2016-09-30: 800 mL via INTRAVENOUS
  Administered 2016-10-01 (×2): via INTRAVENOUS
  Administered 2016-10-01: 1000 mL via INTRAVENOUS
  Administered 2016-10-01 – 2016-10-03 (×6): via INTRAVENOUS

## 2016-09-30 MED ORDER — ENOXAPARIN SODIUM 40 MG/0.4ML ~~LOC~~ SOLN
40.0000 mg | SUBCUTANEOUS | Status: DC
  Administered 2016-09-30 – 2016-10-02 (×3): 40 mg via SUBCUTANEOUS
  Filled 2016-09-30 (×3): qty 0.4

## 2016-09-30 MED ORDER — SENNOSIDES-DOCUSATE SODIUM 8.6-50 MG PO TABS
1.0000 | ORAL_TABLET | Freq: Two times a day (BID) | ORAL | Status: DC
  Administered 2016-10-01 – 2016-10-02 (×3): 1 via ORAL
  Filled 2016-09-30 (×5): qty 1

## 2016-09-30 MED ORDER — POTASSIUM CHLORIDE CRYS ER 20 MEQ PO TBCR
20.0000 meq | EXTENDED_RELEASE_TABLET | Freq: Every day | ORAL | Status: DC
  Administered 2016-10-01 – 2016-10-03 (×3): 20 meq via ORAL
  Filled 2016-09-30 (×3): qty 1

## 2016-09-30 MED ORDER — OXYCODONE HCL 5 MG PO TABS
5.0000 mg | ORAL_TABLET | ORAL | Status: DC | PRN
  Administered 2016-10-01 – 2016-10-03 (×5): 5 mg via ORAL
  Filled 2016-09-30 (×8): qty 1

## 2016-09-30 MED ORDER — POTASSIUM CHLORIDE 10 MEQ/100ML IV SOLN
10.0000 meq | INTRAVENOUS | Status: AC
  Administered 2016-09-30 (×2): 10 meq via INTRAVENOUS
  Filled 2016-09-30 (×2): qty 100

## 2016-09-30 NOTE — Progress Notes (Signed)
Notified Dr. Ophelia CharterYates of pt. Drowsiness, answered her name and place appropriately, but unable to keep her eyes open long enough to take medications.   Incontinent of urine. Hygiene kept. Patient grimacing while urine sat on wounds setting up bath.

## 2016-09-30 NOTE — ED Notes (Signed)
[facility] Pericare provided, several wounds stage 2-3 noted to posterior buttocks. Barrier cream applied, allevyn placed on sacral. telfa applied to wound noted to upper left posterior thigh. Pt tolerated well.

## 2016-09-30 NOTE — Progress Notes (Signed)
Bear Hugger weaned - T-98.2

## 2016-09-30 NOTE — ED Notes (Signed)
Anna NationsDebra Hanna, daughter, [phone]. Please call upon disposition.

## 2016-09-30 NOTE — ED Triage Notes (Addendum)
Per EMS, pt brought in for frequent falls. Last fall today, EMS reports found pt on floor. Pt alert and oriented with EMS. NAD noted. EMS reports family wants nursing home placement. Pt signed herself out of facility several months ago. cbg 149. Pt reports attempted to sit on cough this am. Pt reports missed cough and was laying on the floor until EMS arrived. Pt denies pain. No deformity noted. Mild redness noted to right knee.

## 2016-09-30 NOTE — ED Notes (Signed)
Pt transported to xray 

## 2016-09-30 NOTE — H&P (Signed)
History and Physical    Anna Hanna ONG:[id number] DOB: May 03, 1935 DOA: 09/30/2016  PCP: Milana ObeyStephen D Knowlton, MD Consultants:  Romeo AppleHarrison - orthopedics Patient coming from: home; NOK: Stanton Kidneyebra, daughter  Chief Complaint: fall  HPI: Anna Hanna is a 82 y.o. 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  She was discharged to SNF and eventually signed out AMA with a resultant contracture of that leg and significantly limited mobility.   The patient reports that she missed the couch when she attempted to sit down last night and thinks she was on the floor for approximately 10 hours before she was found by family today.   The family is concerned that she needs placement, although the patient adamantly denies this and refuses care from home health services.  Further history limited by the patient's overall condition - she was on a LawyerBair hugger with hypothermia - and by possible mild cognitive deficit.  ED Course: Patient with hypothermia, treated with a LawyerBair hugger.  Also CK elevation and mild AKI.    Review of Systems: As per HPI; otherwise 10 point review of systems reviewed and negative.   Ambulatory Status:  Ambulates with difficulty  Past Medical History:  Diagnosis Date  . Glaucoma   . Hypercholesteremia   . Hypertension   . Hypokalemia   . Status post total right knee replacement     Past Surgical History:  Procedure Laterality Date  . ABDOMINAL HYSTERECTOMY    . CATARACT EXTRACTION    . COLONOSCOPY N/A 12/10/2013   Procedure: COLONOSCOPY;  Surgeon: West BaliSandi L Fields, MD;  Location: [facility];  Service: Endoscopy;  Laterality: N/A;  8:30 AM  . TOTAL KNEE ARTHROPLASTY Right 03/30/2016   Procedure: TOTAL KNEE ARTHROPLASTY;  Surgeon: Vickki HearingStanley E Harrison, MD;  Location: [facility];  Service: Orthopedics;  Laterality: Right;    Social History   Social History  . Marital status: Divorced    Spouse name: N/A    . Number of children: N/A  . Years of education: N/A   Occupational History  . Not on file.   Social History Main Topics  . Smoking status: Current Some Day Smoker    Packs/day: 0.25    Years: 3.00    Types: Cigarettes  . Smokeless tobacco: Never Used  . Alcohol use No  . Drug use: No  . Sexual activity: Yes    Birth control/ protection: Post-menopausal   Other Topics Concern  . Not on file   Social History Narrative  . No narrative on file    No Known Allergies  Family History  Problem Relation Age of Onset  . Cancer Mother   . Arthritis Mother   . Hypotension Father   . Cancer Sister   . Hypotension Sister   . Colon cancer Neg Hx     Prior to Admission medications   Medication Sig Start Date End Date Taking? Authorizing Provider  acetaminophen (TYLENOL) 500 MG tablet Take 1,000 mg by mouth every 6 (six) hours as needed for mild pain or moderate pain.    Yes Historical Provider, MD  amLODipine (NORVASC) 5 MG tablet Take 1 tablet by mouth Daily. 05/24/12  Yes Historical Provider, MD  hydrochlorothiazide (MICROZIDE) 12.5 MG capsule Take 1 capsule by mouth daily. 08/24/16  Yes Historical Provider, MD  iron polysaccharides (NIFEREX) 150 MG capsule Take 150 mg by mouth daily.   Yes Historical Provider, MD  latanoprost Harrel Lemon(XALATAN)  0.005 % ophthalmic solution Place 1 drop into both eyes at bedtime.   Yes Historical Provider, MD  Menthol, Topical Analgesic, (BIOFREEZE) 4 % GEL Apply 1 application topically daily. To be used after therapy    Yes Historical Provider, MD  polyethylene glycol (MIRALAX / GLYCOLAX) packet Take 17 g by mouth daily as needed for mild constipation or moderate constipation.    Yes Historical Provider, MD  Potassium Chloride ER 20 MEQ TBCR Take 20 mEq by mouth daily at 6 (six) AM.   Yes Historical Provider, MD  senna-docusate (SENOKOT-S) 8.6-50 MG tablet Take 1 tablet by mouth 2 (two) times daily.   Yes Historical Provider, MD  vitamin C (ASCORBIC ACID) 500  MG tablet Take 500 mg by mouth 2 (two) times daily.    Yes Historical Provider, MD  Vitamin D, Cholecalciferol, 1000 units TABS Give 1 tablet by mouth daily   Yes Historical Provider, MD  calcium citrate (CALCITRATE - DOSED IN MG ELEMENTAL CALCIUM) 950 MG tablet Take 200 mg of elemental calcium by mouth daily.    Historical Provider, MD  oxyCODONE (OXY IR/ROXICODONE) 5 MG immediate release tablet Take 1 tablet (5 mg total) by mouth every 4 (four) hours as needed for severe pain. 04/13/16   Kermit Balo, DO    Physical Exam: Vitals:   09/30/16 1800 09/30/16 1815 09/30/16 1830 09/30/16 2027  BP: 114/85 104/57 97/55 (!) 96/53  Pulse: 103 87  (!) 106  Resp: 21 18 18    Temp:    98.2 F (36.8 C)  TempSrc:    Oral  SpO2: 100% 97%  99%  Weight:    77 kg (169 lb 12.1 oz)  Height:    5\' 5"  (1.651 m)     General:  Appears calm and comfortable and is NAD but some apparent cognitive slowing Eyes:  PERRL, EOMI, normal lids, iris ENT:  grossly normal hearing, lips & tongue, mmm Neck:  no LAD, masses or thyromegaly Cardiovascular:  RRR, no m/r/g. No LE edema.  Respiratory:  CTA bilaterally, no w/r/r. Normal respiratory effort. Abdomen:  soft, ntnd, NABS Skin:  Several stage 2 buttock/sacral pressure ulcers Musculoskeletal:  Right knee with apparent chronic edema and mild medial erythema, according to Dr. Liam Rogers report from the family; contracture of right knee such that the leg has limited mobility Psychiatric:  grossly normal mood and affect, speech fluent and appropriate, AOx2 - was unable to effectively name the month or year on multiple attempts Neurologic:  CN 2-12 grossly intact, moves all extremities in coordinated fashion, sensation intact  Labs on Admission: I have personally reviewed following labs and imaging studies  CBC:  Recent Labs Lab 09/30/16 1348  WBC 9.5  NEUTROABS 7.6  HGB 12.5  HCT 37.7  MCV 75.6*  PLT 451*   Basic Metabolic Panel:  Recent Labs Lab  09/30/16 1348  NA 137  K 3.1*  CL 96*  CO2 29  GLUCOSE 126*  BUN 34*  CREATININE 1.10*  CALCIUM 9.3   GFR: Estimated Creatinine Clearance: 41.9 mL/min (by C-G formula based on SCr of 1.1 mg/dL (H)). Liver Function Tests:  Recent Labs Lab 09/30/16 1348  AST 80*  ALT 35  ALKPHOS 101  BILITOT 0.7  PROT 7.4  ALBUMIN 3.3*   No results for input(s): LIPASE, AMYLASE in the last 168 hours. No results for input(s): AMMONIA in the last 168 hours. Coagulation Profile: No results for input(s): INR, PROTIME in the last 168 hours. Cardiac Enzymes:  Recent Labs  Lab 09/30/16 1348  CKTOTAL 2,238*   BNP (last 3 results) No results for input(s): PROBNP in the last 8760 hours. HbA1C: No results for input(s): HGBA1C in the last 72 hours. CBG: No results for input(s): GLUCAP in the last 168 hours. Lipid Profile: No results for input(s): CHOL, HDL, LDLCALC, TRIG, CHOLHDL, LDLDIRECT in the last 72 hours. Thyroid Function Tests: No results for input(s): TSH, T4TOTAL, FREET4, T3FREE, THYROIDAB in the last 72 hours. Anemia Panel: No results for input(s): VITAMINB12, FOLATE, FERRITIN, TIBC, IRON, RETICCTPCT in the last 72 hours. Urine analysis:    Component Value Date/Time   COLORURINE YELLOW 09/30/2016 1351   APPEARANCEUR HAZY (A) 09/30/2016 1351   LABSPEC 1.018 09/30/2016 1351   PHURINE 5.0 09/30/2016 1351   GLUCOSEU NEGATIVE 09/30/2016 1351   HGBUR NEGATIVE 09/30/2016 1351   BILIRUBINUR NEGATIVE 09/30/2016 1351   KETONESUR 5 (A) 09/30/2016 1351   PROTEINUR 30 (A) 09/30/2016 1351   NITRITE NEGATIVE 09/30/2016 1351   LEUKOCYTESUR NEGATIVE 09/30/2016 1351    Creatinine Clearance: Estimated Creatinine Clearance: 41.9 mL/min (by C-G formula based on SCr of 1.1 mg/dL (H)).  Sepsis Labs: @LABRCNTIP (procalcitonin:4,lacticidven:4) ) Recent Results (from the past 240 hour(s))  Blood culture (routine x 2)     Status: None (Preliminary result)   Collection Time: 09/30/16  3:26 PM   Result Value Ref Range Status   Specimen Description BLOOD BLOOD LEFT HAND  Final   Special Requests BOTTLES DRAWN AEROBIC AND ANAEROBIC 8CC  Final   Culture PENDING  Incomplete   Report Status PENDING  Incomplete  Blood culture (routine x 2)     Status: None (Preliminary result)   Collection Time: 09/30/16  3:36 PM  Result Value Ref Range Status   Specimen Description BLOOD BLOOD RIGHT HAND  Final   Special Requests BOTTLES DRAWN AEROBIC AND ANAEROBIC 8CC  Final   Culture PENDING  Incomplete   Report Status PENDING  Incomplete  MRSA PCR Screening     Status: None   Collection Time: 09/30/16  7:15 PM  Result Value Ref Range Status   MRSA by PCR NEGATIVE NEGATIVE Final    Comment:        The GeneXpert MRSA Assay (FDA approved for NASAL specimens only), is one component of a comprehensive MRSA colonization surveillance program. It is not intended to diagnose MRSA infection nor to guide or monitor treatment for MRSA infections.      Radiological Exams on Admission: Dg Chest 2 View  Result Date: 09/30/2016 CLINICAL DATA:  Frequent falls. EXAM: CHEST  2 VIEW COMPARISON:  08/10/2012 FINDINGS: AP and lateral views of the chest were obtained. Lungs are hyperexpanded. The lungs are clear wiithout focal pneumonia, edema, pneumothorax or pleural effusion. Interstitial markings are diffusely coarsened with chronic features. The cardiopericardial silhouette is within normal limits for size. The visualized bony structures of the thorax are intact. Telemetry leads overlie the chest. IMPRESSION: Stable.  No acute findings. Stable chronic interstitial coarsening. Electronically Signed   By: Kennith Center M.D.   On: 09/30/2016 14:58   Dg Pelvis 1-2 Views  Result Date: 09/30/2016 CLINICAL DATA:  Frequent falls EXAM: PELVIS - 1-2 VIEW COMPARISON:  None. FINDINGS: No fracture or dislocation is seen. Bilateral hip joint spaces are mildly narrowed but symmetric. Proximal aspect of the right knee  arthroplasty is visualized, without evidence of complication. IMPRESSION: Negative. Electronically Signed   By: Charline Bills M.D.   On: 09/30/2016 15:02   Ct Head Wo Contrast  Result Date: 09/30/2016  CLINICAL DATA:  Fall EXAM: CT HEAD WITHOUT CONTRAST TECHNIQUE: Contiguous axial images were obtained from the base of the skull through the vertex without intravenous contrast. COMPARISON:  None. FINDINGS: Brain: No evidence of acute infarction, hemorrhage, hydrocephalus, extra-axial collection or mass lesion/mass effect. Subcortical white matter and periventricular small vessel ischemic changes. Vascular: Intracranial atherosclerosis. Skull: Normal. Negative for fracture or focal lesion. Sinuses/Orbits: The visualized paranasal sinuses are essentially clear. The mastoid air cells are unopacified. Other: None. IMPRESSION: No evidence of acute intracranial abnormality. Small vessel ischemic changes. Electronically Signed   By: Charline Bills M.D.   On: 09/30/2016 15:26   Dg Knee Complete 4 Views Right  Result Date: 09/30/2016 CLINICAL DATA:  Multiple falls, status post knee replacement in September 2017 EXAM: RIGHT KNEE - COMPLETE 4+ VIEW COMPARISON:  None. FINDINGS: Status post right knee arthroplasty. Stable appearance along the proximal aspect of the prosthesis when compared to December 2017. No evidence of acute fracture. No suprapatellar knee joint effusion. Visualized soft tissues are within normal limits. IMPRESSION: Status post right knee arthroplasty, without interval change. No fracture or dislocation is seen. Electronically Signed   By: Charline Bills M.D.   On: 09/30/2016 15:01    EKG: not done  Assessment/Plan Principal Problem:   Rhabdomyolysis Active Problems:   Hyperglycemia   Immobility   Hypokalemia   Pressure ulcer, stage 2   Acute renal failure (HCC)   Hypothermia   Rhabdomyolysis -CK 2238 -She does appear to have organ failure as a result: -Creatinine 1.10, 0.5 on  05/30/16; AST 80/ALT 35, previously normal in 9/17 -Admit with aggressive IVF at 150 cc/hour -Hepatic function panel, CK, and BMP repeated in AM -Myoglobin (blood), CBC qAM -Lovenox for DVT prophylaxis -Push PO fluids if able -Telemetry monitoring in SDU due to hypothermia (see below) -Strict I/Os -Vital signs per SDU protocol  Hypothermia -Likely related to fall, rhabdo, and prolonged exposure since the patient was lying in a large pool of urine when she was found -Blood cultures x 2 pending -UA: negative LE, negative nitrite, many bacteria, 0-5 WBC; urine culture pending -No antibiotics for now -Continue Bair hugger until able to wean as per protocol  Hyperglycemia -Glucose 126 -This has been noted on a number of occasions in the past -Will check A1c  Pressure ulcer -Wound care consult  Hypokalemia -K 3.1 -Was repleted with 20 mEq IV KCl in the ER and will give an additional 20 mEq PO KCl x 1 and then resume home supplementation in the AM  Immobility -Patient apparently signed out AMA before she could rehab her knee and has chronic disability from it -Because of this, or at least as a contributor, she has frequent falls -Family would like for her to be placed -The patient, who was not oriented to the date but otherwise appeared to be able to provide a fairly reasonable history despite her hypothermia, adamantly declines placement -Will order PT consult -The patient and family may need to work on this with Dr. Ermalinda Memos -Perhaps there is a way to arrange for more help at home for the patient as a compromise -Will request SW assistance    DVT prophylaxis: Lovenox  Code Status: Full - confirmed with patient Family Communication: None present Disposition Plan:  Home once clinically improved Consults called: SW  Admission status: It is my clinical opinion that referral for OBSERVATION is reasonable and necessary in this patient based on the above information provided. The  aforementioned taken together are felt to place  the patient at high risk for further clinical deterioration. However it is anticipated that the patient may be medically stable for discharge from the hospital within 24 to 48 hours.    Jonah Blue MD [facility]  If 7PM-7AM, please contact night-coverage [email or web address] Password [email or web address]  09/30/2016, 10:07 PM

## 2016-09-30 NOTE — ED Provider Notes (Addendum)
[facility]-EMERGENCY DEPT Provider Note   CSN: [id number] Arrival date & time: 09/30/16  1306  By signing my name below, I, Majel Homer, attest that this documentation has been prepared under the direction and in the presence of Azalia Bilis, MD . Electronically Signed: Majel Homer, Scribe. 09/30/2016. 1:48 PM.  History   Chief Complaint Chief Complaint  Patient presents with  . Fall   The history is provided by a relative and the EMS personnel. No language interpreter was used.   HPI Comments: Anna Hanna is a 82 y.o. female with PMHx of HTN and HLD, brought in by EMS to the Emergency Department for an evaluation s/p a fall that occurred today. Per EMS, pt was attempting to sit on the couch this afternoon when she "missed the couch" and was found lying on the floor until EMS arrived. Pt was alert and oriented and did not lose consciousness. Pt's daughter reports PSHx of a right knee arthroplasty at [facility] on 03/30/16 and states pt refused to participate in physical therapy afterwards. She notes pt has experienced multiple, frequent falls since this surgery as she cannot ambulate independently or perform simple tasks without assistance. She states pt lives at home alone but refuses to receive care from [facility] or a nursing facility, against her family's wishes. Pt's daughter reports she often has to requet neighbor's help to get up from the floor when she falls whenever she is not available. She denies any other complaints.   Past Medical History:  Diagnosis Date  . Glaucoma   . Hypercholesteremia   . Hypertension   . Hypokalemia   . Status post total right knee replacement     Patient Active Problem List   Diagnosis Date Noted  . Hypertension 04/11/2016  . 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 Anemia 04/02/2016  . Hyponatremia 04/02/2016  . Hyperglycemia 04/02/2016  . Femur fracture, right (HCC)  04/02/2016  . Osteoarthritis of right knee 03/30/2016  . Arthritis of knee, right     Past Surgical History:  Procedure Laterality Date  . ABDOMINAL HYSTERECTOMY    . CATARACT EXTRACTION    . COLONOSCOPY N/A 12/10/2013   Procedure: COLONOSCOPY;  Surgeon: West Bali, MD;  Location: [facility] ENDO SUITE;  Service: Endoscopy;  Laterality: N/A;  8:30 AM  . TOTAL KNEE ARTHROPLASTY Right 03/30/2016   Procedure: TOTAL KNEE ARTHROPLASTY;  Surgeon: Vickki Hearing, MD;  Location: [facility] ORS;  Service: Orthopedics;  Laterality: Right;    OB History    No data available     Home Medications    Prior to Admission medications   Medication Sig Start Date End Date Taking? Authorizing Provider  acetaminophen (TYLENOL) 500 MG tablet Take 1,000 mg by mouth every 6 (six) hours as needed for mild pain or moderate pain.     Historical Provider, MD  amLODipine (NORVASC) 5 MG tablet Take 1 tablet by mouth Daily. 05/24/12   Historical Provider, MD  calcium citrate (CALCITRATE - DOSED IN MG ELEMENTAL CALCIUM) 950 MG tablet Take 200 mg of elemental calcium by mouth daily.    Historical Provider, MD  iron polysaccharides (NIFEREX) 150 MG capsule Take 150 mg by mouth daily.    Historical Provider, MD  latanoprost (XALATAN) 0.005 % ophthalmic solution Place 1 drop into both eyes at bedtime.    Historical Provider, MD  Menthol, Topical Analgesic, (BIOFREEZE) 4 % GEL Apply 1 application topically daily. To be  used after therapy     Historical Provider, MD  oxyCODONE (OXY IR/ROXICODONE) 5 MG immediate release tablet Take 1 tablet (5 mg total) by mouth every 4 (four) hours as needed for severe pain. 04/13/16   Tiffany L Reed, DO  polyethylene glycol (MIRALAX / GLYCOLAX) packet Take 17 g by mouth daily as needed for mild constipation or moderate constipation.     Historical Provider, MD  Potassium Chloride ER 20 MEQ TBCR Take 20 mEq by mouth daily at 6 (six) AM.    Historical Provider, MD  senna-docusate (SENOKOT-S) 8.6-50 MG  tablet Take 1 tablet by mouth 2 (two) times daily.    Historical Provider, MD  vitamin C (ASCORBIC ACID) 500 MG tablet Take 500 mg by mouth 2 (two) times daily.     Historical Provider, MD  Vitamin D, Cholecalciferol, 1000 units TABS Give 1 tablet by mouth daily    Historical Provider, MD    Family History Family History  Problem Relation Age of Onset  . Cancer Mother   . Arthritis Mother   . Hypotension Father   . Cancer Sister   . Hypotension Sister   . Colon cancer Neg Hx     Social History Social History  Substance Use Topics  . Smoking status: Current Some Day Smoker    Packs/day: 0.25    Years: 3.00    Types: Cigarettes  . Smokeless tobacco: Never Used  . Alcohol use No   Allergies   Patient has no known allergies.  Review of Systems Review of Systems  Constitutional: Negative for fever.  Neurological: Negative for syncope.  All other systems reviewed and are negative.  Physical Exam Updated Vital Signs BP 113/63 (BP Location: Right Arm)   Pulse 91   Temp 98.3 F (36.8 C) (Oral)   Resp 18   Ht 5\' 7"  (1.702 m)   Wt 210 lb (95.3 kg)   SpO2 99%   BMI 32.89 kg/m   Physical Exam  Constitutional: She is oriented to person, place, and time. She appears well-developed and well-nourished. No distress.  HENT:  Head: Normocephalic and atraumatic.  Eyes: EOM are normal.  Neck: Normal range of motion.  Cardiovascular: Normal rate, regular rhythm and normal heart sounds.   Pulmonary/Chest: Effort normal and breath sounds normal.  Abdominal: Soft. She exhibits no distension. There is no tenderness.  Musculoskeletal: Normal range of motion.  Chronically contracted right knee. Stage 1 sacral decubitus ulcerations without secondary signs of infection.   Neurological: She is alert and oriented to person, place, and time.  Skin: Skin is warm and dry.  Psychiatric: She has a normal mood and affect. Judgment normal.  Nursing note and vitals reviewed.  ED Treatments /  Results  DIAGNOSTIC STUDIES:  Oxygen Saturation is 98% on RA, normal by my interpretation.    COORDINATION OF CARE:  1:39 PM Discussed treatment plan with pt at bedside and pt agreed to plan.  Labs (all labs ordered are listed, but only abnormal results are displayed) Labs Reviewed  CBC WITH DIFFERENTIAL/PLATELET - Abnormal; Notable for the following:       Result Value   MCV 75.6 (*)    MCH 25.1 (*)    RDW 16.1 (*)    Platelets 451 (*)    All other components within normal limits  COMPREHENSIVE METABOLIC PANEL - Abnormal; Notable for the following:    Potassium 3.1 (*)    Chloride 96 (*)    Glucose, Bld 126 (*)  BUN 34 (*)    Creatinine, Ser 1.10 (*)    Albumin 3.3 (*)    AST 80 (*)    GFR calc non Af Amer 46 (*)    GFR calc Af Amer 53 (*)    All other components within normal limits  URINALYSIS, ROUTINE W REFLEX MICROSCOPIC - Abnormal; Notable for the following:    APPearance HAZY (*)    Ketones, ur 5 (*)    Protein, ur 30 (*)    Bacteria, UA MANY (*)    All other components within normal limits  CK - Abnormal; Notable for the following:    Total CK 2,238 (*)    All other components within normal limits  CULTURE, BLOOD (ROUTINE X 2)  CULTURE, BLOOD (ROUTINE X 2)   BUN  Date Value Ref Range Status  09/30/2016 34 (H) 6 - 20 mg/dL Final  16/04/9603 10 6 - 20 mg/dL Final  54/03/8118 13 6 - 20 mg/dL Final  14/78/2956 9 6 - 20 mg/dL Final   Creatinine, Ser  Date Value Ref Range Status  09/30/2016 1.10 (H) 0.44 - 1.00 mg/dL Final  21/30/8657 8.46 0.44 - 1.00 mg/dL Final  96/29/5284 1.32 0.44 - 1.00 mg/dL Final  44/07/270 5.36 0.44 - 1.00 mg/dL Final      EKG  EKG Interpretation None       Radiology Dg Chest 2 View  Result Date: 09/30/2016 CLINICAL DATA:  Frequent falls. EXAM: CHEST  2 VIEW COMPARISON:  08/10/2012 FINDINGS: AP and lateral views of the chest were obtained. Lungs are hyperexpanded. The lungs are clear wiithout focal pneumonia, edema,  pneumothorax or pleural effusion. Interstitial markings are diffusely coarsened with chronic features. The cardiopericardial silhouette is within normal limits for size. The visualized bony structures of the thorax are intact. Telemetry leads overlie the chest. IMPRESSION: Stable.  No acute findings. Stable chronic interstitial coarsening. Electronically Signed   By: Kennith Center M.D.   On: 09/30/2016 14:58   Dg Pelvis 1-2 Views  Result Date: 09/30/2016 CLINICAL DATA:  Frequent falls EXAM: PELVIS - 1-2 VIEW COMPARISON:  None. FINDINGS: No fracture or dislocation is seen. Bilateral hip joint spaces are mildly narrowed but symmetric. Proximal aspect of the right knee arthroplasty is visualized, without evidence of complication. IMPRESSION: Negative. Electronically Signed   By: Charline Bills M.D.   On: 09/30/2016 15:02   Ct Head Wo Contrast  Result Date: 09/30/2016 CLINICAL DATA:  Fall EXAM: CT HEAD WITHOUT CONTRAST TECHNIQUE: Contiguous axial images were obtained from the base of the skull through the vertex without intravenous contrast. COMPARISON:  None. FINDINGS: Brain: No evidence of acute infarction, hemorrhage, hydrocephalus, extra-axial collection or mass lesion/mass effect. Subcortical white matter and periventricular small vessel ischemic changes. Vascular: Intracranial atherosclerosis. Skull: Normal. Negative for fracture or focal lesion. Sinuses/Orbits: The visualized paranasal sinuses are essentially clear. The mastoid air cells are unopacified. Other: None. IMPRESSION: No evidence of acute intracranial abnormality. Small vessel ischemic changes. Electronically Signed   By: Charline Bills M.D.   On: 09/30/2016 15:26   Dg Knee Complete 4 Views Right  Result Date: 09/30/2016 CLINICAL DATA:  Multiple falls, status post knee replacement in September 2017 EXAM: RIGHT KNEE - COMPLETE 4+ VIEW COMPARISON:  None. FINDINGS: Status post right knee arthroplasty. Stable appearance along the proximal  aspect of the prosthesis when compared to December 2017. No evidence of acute fracture. No suprapatellar knee joint effusion. Visualized soft tissues are within normal limits. IMPRESSION: Status post right knee arthroplasty,  without interval change. No fracture or dislocation is seen. Electronically Signed   By: Charline Bills M.D.   On: 09/30/2016 15:01    Procedures Procedures (including critical care time)  Medications Ordered in ED Medications  potassium chloride 10 mEq in 100 mL IVPB (not administered)  sodium chloride 0.9 % bolus 1,000 mL (1,000 mLs Intravenous New Bag/Given 09/30/16 1418)    Initial Impression / Assessment and Plan / ED Course  I have reviewed the triage vital signs and the nursing notes.  Pertinent labs & imaging results that were available during my care of the patient were reviewed by me and considered in my medical decision making (see chart for details).     Patient with hypothermia as well as elevation in her CK probably from being down on the ground.  Bear hugger.  Mild acute kidney injury.  Urine culture sent.  Patient be admitted the hospital for IV fluids and monitoring of her CK and renal function.  She also needs to be warmed.  She does need additional assistance at home or rehabilitation  I personally performed the services described in this documentation, which was scribed in my presence. The recorded information has been reviewed and is accurate.      Final Clinical Impressions(s) / ED Diagnoses   Final diagnoses:  Altered mental status, unspecified altered mental status type  Hypothermia, initial encounter    New Prescriptions New Prescriptions   No medications on file     Azalia Bilis, MD 09/30/16 1617    Azalia Bilis, MD 09/30/16 [phone]

## 2016-10-01 DIAGNOSIS — R5381 Other malaise: Secondary | ICD-10-CM | POA: Diagnosis present

## 2016-10-01 DIAGNOSIS — Z79899 Other long term (current) drug therapy: Secondary | ICD-10-CM | POA: Diagnosis not present

## 2016-10-01 DIAGNOSIS — T68XXXA Hypothermia, initial encounter: Secondary | ICD-10-CM | POA: Diagnosis present

## 2016-10-01 DIAGNOSIS — R296 Repeated falls: Secondary | ICD-10-CM | POA: Diagnosis present

## 2016-10-01 DIAGNOSIS — I10 Essential (primary) hypertension: Secondary | ICD-10-CM | POA: Diagnosis present

## 2016-10-01 DIAGNOSIS — M6282 Rhabdomyolysis: Secondary | ICD-10-CM | POA: Diagnosis present

## 2016-10-01 DIAGNOSIS — E876 Hypokalemia: Secondary | ICD-10-CM | POA: Diagnosis present

## 2016-10-01 DIAGNOSIS — E7800 Pure hypercholesterolemia, unspecified: Secondary | ICD-10-CM | POA: Diagnosis present

## 2016-10-01 DIAGNOSIS — H409 Unspecified glaucoma: Secondary | ICD-10-CM | POA: Diagnosis present

## 2016-10-01 DIAGNOSIS — L89302 Pressure ulcer of unspecified buttock, stage 2: Secondary | ICD-10-CM | POA: Diagnosis present

## 2016-10-01 DIAGNOSIS — Z96651 Presence of right artificial knee joint: Secondary | ICD-10-CM | POA: Diagnosis present

## 2016-10-01 DIAGNOSIS — R739 Hyperglycemia, unspecified: Secondary | ICD-10-CM | POA: Diagnosis present

## 2016-10-01 DIAGNOSIS — E785 Hyperlipidemia, unspecified: Secondary | ICD-10-CM | POA: Diagnosis present

## 2016-10-01 DIAGNOSIS — N179 Acute kidney failure, unspecified: Secondary | ICD-10-CM | POA: Diagnosis present

## 2016-10-01 DIAGNOSIS — T796XXD Traumatic ischemia of muscle, subsequent encounter: Secondary | ICD-10-CM

## 2016-10-01 DIAGNOSIS — F17210 Nicotine dependence, cigarettes, uncomplicated: Secondary | ICD-10-CM | POA: Diagnosis present

## 2016-10-01 DIAGNOSIS — L89152 Pressure ulcer of sacral region, stage 2: Secondary | ICD-10-CM | POA: Diagnosis present

## 2016-10-01 LAB — BASIC METABOLIC PANEL
Anion gap: 10 (ref 5–15)
BUN: 24 mg/dL — AB (ref 6–20)
CO2: 25 mmol/L (ref 22–32)
CREATININE: 0.77 mg/dL (ref 0.44–1.00)
Calcium: 8 mg/dL — ABNORMAL LOW (ref 8.9–10.3)
Chloride: 103 mmol/L (ref 101–111)
GFR calc Af Amer: >60 mL/min (ref >60)
Glucose, Bld: 89 mg/dL (ref 65–99)
Potassium: 2.5 mmol/L — CL (ref 3.5–5.1)
SODIUM: 138 mmol/L (ref 135–145)

## 2016-10-01 LAB — CBC
HCT: 30.3 % — ABNORMAL LOW (ref 36.0–46.0)
Hemoglobin: 10 g/dL — ABNORMAL LOW (ref 12.0–15.0)
MCH: 25.1 pg — ABNORMAL LOW (ref 26.0–34.0)
MCHC: 33 g/dL (ref 30.0–36.0)
MCV: 75.9 fL — AB (ref 78.0–100.0)
PLATELETS: 432 10*3/uL — AB (ref 150–400)
RBC: 3.99 MIL/uL (ref 3.87–5.11)
RDW: 16.3 % — AB (ref 11.5–15.5)
WBC: 10.2 10*3/uL (ref 4.0–10.5)

## 2016-10-01 LAB — HEPATIC FUNCTION PANEL
ALK PHOS: 72 U/L (ref 38–126)
ALT: 33 U/L (ref 14–54)
AST: 83 U/L — ABNORMAL HIGH (ref 15–41)
Albumin: 2.4 g/dL — ABNORMAL LOW (ref 3.5–5.0)
Bilirubin, Direct: 0.2 mg/dL (ref 0.1–0.5)
Indirect Bilirubin: 0.6 mg/dL (ref 0.3–0.9)
Total Bilirubin: 0.8 mg/dL (ref 0.3–1.2)
Total Protein: 5.5 g/dL — ABNORMAL LOW (ref 6.5–8.1)

## 2016-10-01 LAB — MAGNESIUM: Magnesium: 1.8 mg/dL (ref 1.7–2.4)

## 2016-10-01 LAB — CK: Total CK: 2035 U/L — ABNORMAL HIGH (ref 38–234)

## 2016-10-01 MED ORDER — POTASSIUM CHLORIDE CRYS ER 20 MEQ PO TBCR
40.0000 meq | EXTENDED_RELEASE_TABLET | ORAL | Status: AC
  Administered 2016-10-01 (×4): 40 meq via ORAL
  Filled 2016-10-01 (×4): qty 2

## 2016-10-01 NOTE — Consult Note (Signed)
WOC Nurse wound consult note Reason for Consult: Patient found "down" in a seated position for approximately 10 hours and was sitting in urine and fecal incontinence. Severe Moisture Associated Skin Damage on the bilateral buttocks, or MASD (specifically, incontinence associated dermatitis, or IAD) noted and with an Unstageable pressure injury to the right ischial tuberosity.  There is a bruise and some edema on right right knee, but no open wound or warmth. Patient reports her bilateral heels are tender to the touch (R>L), but there is no skin breakdown or discoloration on either at this time. We cannot rule out the presence of deep tissue pressure injuries in these locations at this time. Wound type: Moisture, pressure Pressure Injury POA: Yes, right IT.  May also present on the right heel and right knee (patella). Measurement: Total area of involvement measures 14cm x 25cm and encompasses both buttocks and the right IT.  The right IT Unstageable pressure injury measures 5cm x 6cm with an area of white soft eschar measuring 3cm x 4cm.  The rest of the wound is red, moist with scant serous exudate.  The bilateral buttocks present with 7cm x 4cm areas of patchy erythema with partial and full thickness injury from exposure to chemical assaults of urine and stool. There is no bleeding, red, moist tissues present with scant to small amounts of serous exudate. The right knee area of ecchymosis measures 3cm round.  The bilateral heels are intact with no discoloration. There is moisture accumulation in the subpannicular skin fold, but no skin  Wound bed:As described above Drainage (amount, consistency, odor) As described above Periwound: Intact, dry. Dressing procedure/placement/frequency: Patient is on a mattress replacement with low air loss feature. I have ordered this therapy to continue when patient is transferred to the floor. Patient is to be turned and repositioned from side to side and time in the supine  position minimized.  HOB is to be at the lowest degree tolerated and below 30 degrees except for meals. POC for tissue repair and regeneration with be to use our pH balanced skin cleanser in conjunction with the moisture barrier product (Critic Aid Clear) as often as every two hours.  Only one underpad is to be used beneath the patient so that she get the maximum benefit from the low air loss therapy.  Bilateral heel pressure redistribution boots are provided.  We will address the moisture accumulation in the subpannicular skin fold with our house moisture wicking textile, InterDry Ag+.  WOC nursing team will not follow, but will remain available to this patient, the nursing and medical teams.  Please re-consult if needed. Thank you for consulting us on this nice woman. Ladona MowLaurie Kymari Nuon, MSN, RN, GNP, Hans EdenCWOCN, CWON-[facility], FAAN  Pager# [phone]

## 2016-10-01 NOTE — Progress Notes (Signed)
PROGRESS NOTE    Anna RubensMelba J Hanna  ZOX:[id number] DOB: January 28, 1935 DOA: 09/30/2016 PCP: Milana ObeyStephen D Knowlton, MD     Brief Narrative:  82 year old woman admitted to the hospital on 3/10 from home after she was found down for approximately 10 hours sitting in urine and feces. She was found to have rhabdomyolysis and renal failure and admission was requested.   Assessment & Plan:   Principal Problem:   Rhabdomyolysis Active Problems:   Hyperglycemia   Immobility   Hypokalemia   Pressure ulcer, unsteagable   Acute renal failure (HCC)   Hypothermia   Rhabdomyolysis -CPK improving with IV fluids, will continue today.  Acute renal failure -resolved with IV fluids.  Hypokalemia -Replete orally, check magnesium level.  Weakness/deconditioning -Will ask PT for evaluation. -It appears she recently signed out AMA from skilled nursing facility without completing her rehabilitation for her periprosthetic knee fracture and has subsequently been falling ever since.   DVT prophylaxis: Lovenox Code Status: Full code Family Communication: Patient only Disposition Plan: To be determined  Consultants:   None  Procedures:   None  Antimicrobials:  Anti-infectives    None       Subjective: Sleepy, has no current complaints other than weakness  Objective: Vitals:   10/01/16 1300 10/01/16 1400 10/01/16 1500 10/01/16 1600  BP: 121/64 92/60 (!) 94/47 (!) 105/56  Pulse: (!) 102 (!) 103 97 97  Resp: (!) 25 19 17  (!) 21  Temp:      TempSrc:      SpO2: 100% 100%  100%  Weight:      Height:        Intake/Output Summary (Last 24 hours) at 10/01/16 1648 Last data filed at 10/01/16 0600  Gross per 24 hour  Intake           1467.5 ml  Output                0 ml  Net           1467.5 ml   Filed Weights   09/30/16 1334 09/30/16 2027 10/01/16 0500  Weight: 95.3 kg (210 lb) 77 kg (169 lb 12.1 oz) 76.7 kg (169 lb 1.5 oz)    Examination:  General exam: Alert, awake, oriented  x 3 Respiratory system: Clear to auscultation. Respiratory effort normal. Cardiovascular system:RRR. No murmurs, rubs, gallops. Gastrointestinal system: Abdomen is nondistended, soft and nontender. No organomegaly or masses felt. Normal bowel sounds heard. Central nervous system: Alert and oriented. No focal neurological deficits. Extremities: No C/C/E, +pedal pulses Skin: No rashes, lesions or ulcers Psychiatry: Judgement and insight appear normal. Mood & affect appropriate.     Data Reviewed: I have personally reviewed following labs and imaging studies  CBC:  Recent Labs Lab 09/30/16 1348 10/01/16 0556  WBC 9.5 10.2  NEUTROABS 7.6  --   HGB 12.5 10.0*  HCT 37.7 30.3*  MCV 75.6* 75.9*  PLT 451* 432*   Basic Metabolic Panel:  Recent Labs Lab 09/30/16 1348 10/01/16 0556  NA 137 138  K 3.1* 2.5*  CL 96* 103  CO2 29 25  GLUCOSE 126* 89  BUN 34* 24*  CREATININE 1.10* 0.77  CALCIUM 9.3 8.0*  MG  --  1.8   GFR: Estimated Creatinine Clearance: 57.5 mL/min (by C-G formula based on SCr of 0.77 mg/dL). Liver Function Tests:  Recent Labs Lab 09/30/16 1348 10/01/16 0556  AST 80* 83*  ALT 35 33  ALKPHOS 101 72  BILITOT 0.7 0.8  PROT  7.4 5.5*  ALBUMIN 3.3* 2.4*   No results for input(s): LIPASE, AMYLASE in the last 168 hours. No results for input(s): AMMONIA in the last 168 hours. Coagulation Profile: No results for input(s): INR, PROTIME in the last 168 hours. Cardiac Enzymes:  Recent Labs Lab 09/30/16 1348 10/01/16 0556  CKTOTAL 2,238* 2,035*   BNP (last 3 results) No results for input(s): PROBNP in the last 8760 hours. HbA1C: No results for input(s): HGBA1C in the last 72 hours. CBG: No results for input(s): GLUCAP in the last 168 hours. Lipid Profile: No results for input(s): CHOL, HDL, LDLCALC, TRIG, CHOLHDL, LDLDIRECT in the last 72 hours. Thyroid Function Tests: No results for input(s): TSH, T4TOTAL, FREET4, T3FREE, THYROIDAB in the last 72  hours. Anemia Panel: No results for input(s): VITAMINB12, FOLATE, FERRITIN, TIBC, IRON, RETICCTPCT in the last 72 hours. Urine analysis:    Component Value Date/Time   COLORURINE YELLOW 09/30/2016 1351   APPEARANCEUR HAZY (A) 09/30/2016 1351   LABSPEC 1.018 09/30/2016 1351   PHURINE 5.0 09/30/2016 1351   GLUCOSEU NEGATIVE 09/30/2016 1351   HGBUR NEGATIVE 09/30/2016 1351   BILIRUBINUR NEGATIVE 09/30/2016 1351   KETONESUR 5 (A) 09/30/2016 1351   PROTEINUR 30 (A) 09/30/2016 1351   NITRITE NEGATIVE 09/30/2016 1351   LEUKOCYTESUR NEGATIVE 09/30/2016 1351   Sepsis Labs: @LABRCNTIP (procalcitonin:4,lacticidven:4)  ) Recent Results (from the past 240 hour(s))  Blood culture (routine x 2)     Status: None (Preliminary result)   Collection Time: 09/30/16  3:26 PM  Result Value Ref Range Status   Specimen Description BLOOD BLOOD LEFT HAND  Final   Special Requests BOTTLES DRAWN AEROBIC AND ANAEROBIC 8CC  Final   Culture NO GROWTH < 24 HOURS  Final   Report Status PENDING  Incomplete  Blood culture (routine x 2)     Status: None (Preliminary result)   Collection Time: 09/30/16  3:36 PM  Result Value Ref Range Status   Specimen Description BLOOD BLOOD RIGHT HAND  Final   Special Requests BOTTLES DRAWN AEROBIC AND ANAEROBIC 8CC  Final   Culture NO GROWTH < 24 HOURS  Final   Report Status PENDING  Incomplete  MRSA PCR Screening     Status: None   Collection Time: 09/30/16  7:15 PM  Result Value Ref Range Status   MRSA by PCR NEGATIVE NEGATIVE Final    Comment:        The GeneXpert MRSA Assay (FDA approved for NASAL specimens only), is one component of a comprehensive MRSA colonization surveillance program. It is not intended to diagnose MRSA infection nor to guide or monitor treatment for MRSA infections.          Radiology Studies: Dg Chest 2 View  Result Date: 09/30/2016 CLINICAL DATA:  Frequent falls. EXAM: CHEST  2 VIEW COMPARISON:  08/10/2012 FINDINGS: AP and lateral  views of the chest were obtained. Lungs are hyperexpanded. The lungs are clear wiithout focal pneumonia, edema, pneumothorax or pleural effusion. Interstitial markings are diffusely coarsened with chronic features. The cardiopericardial silhouette is within normal limits for size. The visualized bony structures of the thorax are intact. Telemetry leads overlie the chest. IMPRESSION: Stable.  No acute findings. Stable chronic interstitial coarsening. Electronically Signed   By: Kennith Center M.D.   On: 09/30/2016 14:58   Dg Pelvis 1-2 Views  Result Date: 09/30/2016 CLINICAL DATA:  Frequent falls EXAM: PELVIS - 1-2 VIEW COMPARISON:  None. FINDINGS: No fracture or dislocation is seen. Bilateral hip joint spaces are mildly  narrowed but symmetric. Proximal aspect of the right knee arthroplasty is visualized, without evidence of complication. IMPRESSION: Negative. Electronically Signed   By: Charline Bills M.D.   On: 09/30/2016 15:02   Ct Head Wo Contrast  Result Date: 09/30/2016 CLINICAL DATA:  Fall EXAM: CT HEAD WITHOUT CONTRAST TECHNIQUE: Contiguous axial images were obtained from the base of the skull through the vertex without intravenous contrast. COMPARISON:  None. FINDINGS: Brain: No evidence of acute infarction, hemorrhage, hydrocephalus, extra-axial collection or mass lesion/mass effect. Subcortical white matter and periventricular small vessel ischemic changes. Vascular: Intracranial atherosclerosis. Skull: Normal. Negative for fracture or focal lesion. Sinuses/Orbits: The visualized paranasal sinuses are essentially clear. The mastoid air cells are unopacified. Other: None. IMPRESSION: No evidence of acute intracranial abnormality. Small vessel ischemic changes. Electronically Signed   By: Charline Bills M.D.   On: 09/30/2016 15:26   Dg Knee Complete 4 Views Right  Result Date: 09/30/2016 CLINICAL DATA:  Multiple falls, status post knee replacement in September 2017 EXAM: RIGHT KNEE -  COMPLETE 4+ VIEW COMPARISON:  None. FINDINGS: Status post right knee arthroplasty. Stable appearance along the proximal aspect of the prosthesis when compared to December 2017. No evidence of acute fracture. No suprapatellar knee joint effusion. Visualized soft tissues are within normal limits. IMPRESSION: Status post right knee arthroplasty, without interval change. No fracture or dislocation is seen. Electronically Signed   By: Charline Bills M.D.   On: 09/30/2016 15:01        Scheduled Meds: . enoxaparin (LOVENOX) injection  40 mg Subcutaneous Q24H  . feeding supplement  1 Container Oral TID BM  . latanoprost  1 drop Both Eyes QHS  . potassium chloride  20 mEq Oral Daily  . potassium chloride  20 mEq Oral Once  . potassium chloride  40 mEq Oral Q4H  . senna-docusate  1 tablet Oral BID   Continuous Infusions: . sodium chloride 150 mL/hr at 10/01/16 1621     LOS: 0 days    Time spent: 25 minutes. Greater than 50% of this time was spent in direct contact with the patient coordinating care.     Chaya Jan, MD [facility] Pager [phone]  If 7PM-7AM, please contact night-coverage [email or web address] Password [facility] 10/01/2016, 4:48 PM

## 2016-10-01 NOTE — Progress Notes (Signed)
Patient alert and oriented, resting in bed watching tv. IV patent. Vital signs are stable. Denies pain. Report called to Rose Hill AcresMegan, Charity fundraiserN. Patient transferred via bed to room 339.

## 2016-10-02 LAB — CBC
HEMATOCRIT: 28.5 % — AB (ref 36.0–46.0)
HEMOGLOBIN: 9.3 g/dL — AB (ref 12.0–15.0)
MCH: 24.9 pg — ABNORMAL LOW (ref 26.0–34.0)
MCHC: 32.6 g/dL (ref 30.0–36.0)
MCV: 76.4 fL — AB (ref 78.0–100.0)
Platelets: 428 10*3/uL — ABNORMAL HIGH (ref 150–400)
RBC: 3.73 MIL/uL — ABNORMAL LOW (ref 3.87–5.11)
RDW: 16.7 % — AB (ref 11.5–15.5)
WBC: 6.9 10*3/uL (ref 4.0–10.5)

## 2016-10-02 LAB — BASIC METABOLIC PANEL
ANION GAP: 6 (ref 5–15)
BUN: 12 mg/dL (ref 6–20)
CHLORIDE: 110 mmol/L (ref 101–111)
CO2: 22 mmol/L (ref 22–32)
Calcium: 7.8 mg/dL — ABNORMAL LOW (ref 8.9–10.3)
Creatinine, Ser: 0.55 mg/dL (ref 0.44–1.00)
GFR calc Af Amer: >60 mL/min (ref >60)
GFR calc non Af Amer: >60 mL/min (ref >60)
GLUCOSE: 109 mg/dL — AB (ref 65–99)
POTASSIUM: 4 mmol/L (ref 3.5–5.1)
SODIUM: 138 mmol/L (ref 135–145)

## 2016-10-02 LAB — HEMOGLOBIN A1C
HEMOGLOBIN A1C: 6 % — AB (ref 4.8–5.6)
Mean Plasma Glucose: 126 mg/dL

## 2016-10-02 LAB — CK: Total CK: 811 U/L — ABNORMAL HIGH (ref 38–234)

## 2016-10-02 LAB — MYOGLOBIN, SERUM: Myoglobin: 496 ng/mL — ABNORMAL HIGH (ref 25–58)

## 2016-10-02 MED ORDER — ENSURE ENLIVE PO LIQD
237.0000 mL | Freq: Two times a day (BID) | ORAL | Status: DC
  Administered 2016-10-02 – 2016-10-03 (×2): 237 mL via ORAL

## 2016-10-02 MED ORDER — PRO-STAT SUGAR FREE PO LIQD
30.0000 mL | Freq: Three times a day (TID) | ORAL | Status: DC
  Administered 2016-10-03: 30 mL via ORAL
  Filled 2016-10-02: qty 30

## 2016-10-02 MED ORDER — LATANOPROST 0.005 % OP SOLN
OPHTHALMIC | Status: AC
  Filled 2016-10-02: qty 2.5

## 2016-10-02 MED ORDER — ADULT MULTIVITAMIN W/MINERALS CH
1.0000 | ORAL_TABLET | Freq: Every day | ORAL | Status: DC
  Administered 2016-10-02 – 2016-10-03 (×2): 1 via ORAL
  Filled 2016-10-02 (×2): qty 1

## 2016-10-02 NOTE — Care Management Note (Signed)
Case Management Note  Patient Details  Name: Anna RubensMelba J Bucio MRN: [id number] Date of Birth: 1935-04-15  Subjective/Objective:                  Pt admitted with rhabdo. She is from home, lives alone with family who check on her. PT has recommended SNF and pt is agreeable. CSW is aware an working with pt on placement.   Action/Plan: No CM needs anticipated.   Expected Discharge Date:      10/04/2016            Expected Discharge Plan:  Skilled Nursing Facility  In-House Referral:  Clinical Social Work  Discharge planning Services  CM Consult  Post Acute Care Choice:  NA Choice offered to:  NA  Status of Service:  Completed, signed off  Malcolm MetroChildress, Saxon Barich Demske, RN 10/02/2016, 12:43 PM

## 2016-10-02 NOTE — Progress Notes (Signed)
PROGRESS NOTE    Anna Hanna  ZOX:[id number] DOB: 08/03/1934 DOA: 09/30/2016 PCP: Milana Obey, MD     Brief Narrative:  82 year old woman admitted to the hospital on 3/10 from home after she was found down for approximately 10 hours sitting in urine and feces. She was found to have rhabdomyolysis and renal failure and admission was requested.   Assessment & Plan:   Principal Problem:   Rhabdomyolysis Active Problems:   Hyperglycemia   Immobility   Hypokalemia   Pressure ulcer, unsteagable   Acute renal failure (HCC)   Hypothermia   Rhabdomyolysis -CPK improving with IV fluids, will continue today. -Recheck CPK levels in am.  Acute renal failure -resolved with IV fluids.  Hypokalemia -Repleted orally. -Mg ok at 1.8.  Weakness/deconditioning -PT recs SNF. -SW is aware.   DVT prophylaxis: Lovenox Code Status: Full code Family Communication: Patient only Disposition Plan: SNF once medically ready; anticipate 24-48 hours  Consultants:   None  Procedures:   None  Antimicrobials:  Anti-infectives    None       Subjective: States she is still weak. Is agreeable to going to SNF as long as it is not [facility].  Objective: Vitals:   10/01/16 1500 10/01/16 1600 10/01/16 2158 10/02/16 0600  BP: (!) 94/47 (!) 105/56 (!) 109/53 (!) 100/44  Pulse: 97 97 66 95  Resp: 17 (!) 21 18 18   Temp:   99.3 F (37.4 C) 98.9 F (37.2 C)  TempSrc:   Oral Oral  SpO2:  100% 99% 98%  Weight:      Height:        Intake/Output Summary (Last 24 hours) at 10/02/16 1350 Last data filed at 10/02/16 1247  Gross per 24 hour  Intake             3780 ml  Output                0 ml  Net             3780 ml   Filed Weights   09/30/16 1334 09/30/16 2027 10/01/16 0500  Weight: 95.3 kg (210 lb) 77 kg (169 lb 12.1 oz) 76.7 kg (169 lb 1.5 oz)    Examination:  General exam: Alert, awake, oriented x 3 Respiratory system: Clear to auscultation. Respiratory effort  normal. Cardiovascular system:RRR. No murmurs, rubs, gallops. Gastrointestinal system: Abdomen is nondistended, soft and nontender. No organomegaly or masses felt. Normal bowel sounds heard. Central nervous system: Alert and oriented. No focal neurological deficits. Extremities: No C/C/E, +pedal pulses Skin: No rashes, lesions or ulcers Psychiatry: Judgement and insight appear normal. Mood & affect appropriate.     Data Reviewed: I have personally reviewed following labs and imaging studies  CBC:  Recent Labs Lab 09/30/16 1348 10/01/16 0556 10/02/16 0441  WBC 9.5 10.2 6.9  NEUTROABS 7.6  --   --   HGB 12.5 10.0* 9.3*  HCT 37.7 30.3* 28.5*  MCV 75.6* 75.9* 76.4*  PLT 451* 432* 428*   Basic Metabolic Panel:  Recent Labs Lab 09/30/16 1348 10/01/16 0556 10/02/16 0441  NA 137 138 138  K 3.1* 2.5* 4.0  CL 96* 103 110  CO2 29 25 22   GLUCOSE 126* 89 109*  BUN 34* 24* 12  CREATININE 1.10* 0.77 0.55  CALCIUM 9.3 8.0* 7.8*  MG  --  1.8  --    GFR: Estimated Creatinine Clearance: 57.5 mL/min (by C-G formula based on SCr of 0.55 mg/dL). Liver Function  Tests:  Recent Labs Lab 09/30/16 1348 10/01/16 0556  AST 80* 83*  ALT 35 33  ALKPHOS 101 72  BILITOT 0.7 0.8  PROT 7.4 5.5*  ALBUMIN 3.3* 2.4*   No results for input(s): LIPASE, AMYLASE in the last 168 hours. No results for input(s): AMMONIA in the last 168 hours. Coagulation Profile: No results for input(s): INR, PROTIME in the last 168 hours. Cardiac Enzymes:  Recent Labs Lab 09/30/16 1348 10/01/16 0556 10/02/16 0441  CKTOTAL 2,238* 2,035* 811*   BNP (last 3 results) No results for input(s): PROBNP in the last 8760 hours. HbA1C:  Recent Labs  09/30/16 1348  HGBA1C 6.0*   CBG: No results for input(s): GLUCAP in the last 168 hours. Lipid Profile: No results for input(s): CHOL, HDL, LDLCALC, TRIG, CHOLHDL, LDLDIRECT in the last 72 hours. Thyroid Function Tests: No results for input(s): TSH, T4TOTAL,  FREET4, T3FREE, THYROIDAB in the last 72 hours. Anemia Panel: No results for input(s): VITAMINB12, FOLATE, FERRITIN, TIBC, IRON, RETICCTPCT in the last 72 hours. Urine analysis:    Component Value Date/Time   COLORURINE YELLOW 09/30/2016 1351   APPEARANCEUR HAZY (A) 09/30/2016 1351   LABSPEC 1.018 09/30/2016 1351   PHURINE 5.0 09/30/2016 1351   GLUCOSEU NEGATIVE 09/30/2016 1351   HGBUR NEGATIVE 09/30/2016 1351   BILIRUBINUR NEGATIVE 09/30/2016 1351   KETONESUR 5 (A) 09/30/2016 1351   PROTEINUR 30 (A) 09/30/2016 1351   NITRITE NEGATIVE 09/30/2016 1351   LEUKOCYTESUR NEGATIVE 09/30/2016 1351   Sepsis Labs: @LABRCNTIP (procalcitonin:4,lacticidven:4)  ) Recent Results (from the past 240 hour(s))  Urine culture     Status: Abnormal (Preliminary result)   Collection Time: 09/30/16  1:51 PM  Result Value Ref Range Status   Specimen Description URINE, CLEAN CATCH  Final   Special Requests NONE  Final   Culture (A)  Final    >=100,000 COLONIES/mL ESCHERICHIA COLI >=100,000 COLONIES/mL KLEBSIELLA PNEUMONIAE SUSCEPTIBILITIES TO FOLLOW Performed at [facility], [address] [place], [place] [address]    Report Status PENDING  Incomplete  Blood culture (routine x 2)     Status: None (Preliminary result)   Collection Time: 09/30/16  3:26 PM  Result Value Ref Range Status   Specimen Description BLOOD BLOOD LEFT HAND  Final   Special Requests BOTTLES DRAWN AEROBIC AND ANAEROBIC 8CC  Final   Culture NO GROWTH 2 DAYS  Final   Report Status PENDING  Incomplete  Blood culture (routine x 2)     Status: None (Preliminary result)   Collection Time: 09/30/16  3:36 PM  Result Value Ref Range Status   Specimen Description BLOOD BLOOD RIGHT HAND  Final   Special Requests BOTTLES DRAWN AEROBIC AND ANAEROBIC 8CC  Final   Culture NO GROWTH 2 DAYS  Final   Report Status PENDING  Incomplete  MRSA PCR Screening     Status: None   Collection Time: 09/30/16  7:15 PM  Result Value Ref Range  Status   MRSA by PCR NEGATIVE NEGATIVE Final    Comment:        The GeneXpert MRSA Assay (FDA approved for NASAL specimens only), is one component of a comprehensive MRSA colonization surveillance program. It is not intended to diagnose MRSA infection nor to guide or monitor treatment for MRSA infections.          Radiology Studies: Dg Chest 2 View  Result Date: 09/30/2016 CLINICAL DATA:  Frequent falls. EXAM: CHEST  2 VIEW COMPARISON:  08/10/2012 FINDINGS: AP and lateral views  of the chest were obtained. Lungs are hyperexpanded. The lungs are clear wiithout focal pneumonia, edema, pneumothorax or pleural effusion. Interstitial markings are diffusely coarsened with chronic features. The cardiopericardial silhouette is within normal limits for size. The visualized bony structures of the thorax are intact. Telemetry leads overlie the chest. IMPRESSION: Stable.  No acute findings. Stable chronic interstitial coarsening. Electronically Signed   By: Kennith CenterEric  Mansell M.D.   On: 09/30/2016 14:58   Dg Pelvis 1-2 Views  Result Date: 09/30/2016 CLINICAL DATA:  Frequent falls EXAM: PELVIS - 1-2 VIEW COMPARISON:  None. FINDINGS: No fracture or dislocation is seen. Bilateral hip joint spaces are mildly narrowed but symmetric. Proximal aspect of the right knee arthroplasty is visualized, without evidence of complication. IMPRESSION: Negative. Electronically Signed   By: Charline BillsSriyesh  Krishnan M.D.   On: 09/30/2016 15:02   Ct Head Wo Contrast  Result Date: 09/30/2016 CLINICAL DATA:  Fall EXAM: CT HEAD WITHOUT CONTRAST TECHNIQUE: Contiguous axial images were obtained from the base of the skull through the vertex without intravenous contrast. COMPARISON:  None. FINDINGS: Brain: No evidence of acute infarction, hemorrhage, hydrocephalus, extra-axial collection or mass lesion/mass effect. Subcortical white matter and periventricular small vessel ischemic changes. Vascular: Intracranial atherosclerosis. Skull:  Normal. Negative for fracture or focal lesion. Sinuses/Orbits: The visualized paranasal sinuses are essentially clear. The mastoid air cells are unopacified. Other: None. IMPRESSION: No evidence of acute intracranial abnormality. Small vessel ischemic changes. Electronically Signed   By: Charline BillsSriyesh  Krishnan M.D.   On: 09/30/2016 15:26   Dg Knee Complete 4 Views Right  Result Date: 09/30/2016 CLINICAL DATA:  Multiple falls, status post knee replacement in September 2017 EXAM: RIGHT KNEE - COMPLETE 4+ VIEW COMPARISON:  None. FINDINGS: Status post right knee arthroplasty. Stable appearance along the proximal aspect of the prosthesis when compared to December 2017. No evidence of acute fracture. No suprapatellar knee joint effusion. Visualized soft tissues are within normal limits. IMPRESSION: Status post right knee arthroplasty, without interval change. No fracture or dislocation is seen. Electronically Signed   By: Charline BillsSriyesh  Krishnan M.D.   On: 09/30/2016 15:01        Scheduled Meds: . enoxaparin (LOVENOX) injection  40 mg Subcutaneous Q24H  . feeding supplement  1 Container Oral TID BM  . latanoprost  1 drop Both Eyes QHS  . potassium chloride  20 mEq Oral Daily  . potassium chloride  20 mEq Oral Once  . senna-docusate  1 tablet Oral BID   Continuous Infusions: . sodium chloride 150 mL/hr at 10/02/16 1159     LOS: 1 day    Time spent: 25 minutes. Greater than 50% of this time was spent in direct contact with the patient coordinating care.     Chaya JanHERNANDEZ ACOSTA,Paola Flynt, MD [facility] Pager [phone]  If 7PM-7AM, please contact night-coverage [email or web address] Password [email or web address] 10/02/2016, 1:50 PM

## 2016-10-02 NOTE — Progress Notes (Signed)
Pt has requested all four side rails remain up except for when she is eating. She states it makes her feel safe and secure.  Pt has had 5 large BM's today, and 3 of the times they were incontinent episodes. Patient also had incontinence of urine once today.  Pt complains of pain in right knee and right ankle. Pain goes away with heat pack.  Great day otherwise. Will continue to monitor.  Genelle Balameron D Avi Kerschner, RN

## 2016-10-02 NOTE — Progress Notes (Signed)
Initial Nutrition Assessment   INTERVENTION:  Ensure Enlive po BID, each supplement provides 350 kcal and 20 grams of protein   ProStat 30 ml TID (each 30 ml provides 100 kcal, 15 gr protein)   Multivitamin daily  NUTRITION DIAGNOSIS:   Increased nutrient needs related to wound healing as evidenced by estimated needs.  GOAL:   Patient will meet greater than or equal to 90% of their needs  MONITOR:   Supplement acceptance, Weight trends, Skin, PO intake  REASON FOR ASSESSMENT:   Malnutrition Screening Tool    ASSESSMENT:  Patient lives alone but says her cousins live around near her. She presents after a fall with rhabdomyolysis.   She has hx of hip fracture and [facility] Discharged on 06/02/16. Her weight at that time 194.8# (88.5 kg). If her current weight is correct she has significant wt loss (13% in 4 months). Discussed obtaining a re-wt to verify loss with her nurse today and that is pending.   Anna Hanna says her cousins help her with making sure she has food to eat. She did eat sausage, eggs, coffee and juice this morning and 50% of her lunch as well. She does have upper and lower dentures and says she is able to use them to eat and denies changes in the way they fit.  Nutrition-Focused physical exam completed. Findings are mild fat depletion, mild muscle depletion, and no edema. Sarcopenia  with her limited mobility and age. I expect she is not eating well enough to meet her increased metabolic needs with the multiple areas of skin  breakdown she has.    Recent Labs Lab 09/30/16 1348 10/01/16 0556 10/02/16 0441  NA 137 138 138  K 3.1* 2.5* 4.0  CL 96* 103 110  CO2 29 25 22   BUN 34* 24* 12  CREATININE 1.10* 0.77 0.55  CALCIUM 9.3 8.0* 7.8*  MG  --  1.8  --   GLUCOSE 126* 89 109*   Labs/Meds: reviewed   Diet Order:  Diet regular Room service appropriate? Yes; Fluid consistency: Thin  Skin:   multiple stage II and Unstageable pressure  injuries (refer to skin assessment)  Last BM:  10/02/16  Height:   Ht Readings from Last 1 Encounters:  09/30/16 5\' 5"  (1.651 m)    Weight:   Wt Readings from Last 1 Encounters:  10/01/16 169 lb 1.5 oz (76.7 kg)    Ideal Body Weight:  57 kg  BMI:  Body mass index is 28.14 kg/m.  Estimated Nutritional Needs:   Kcal:  [id number]  Protein:  85-95 gr  Fluid:  1.5-1.7 liters daily  EDUCATION NEEDS:   No education needs identified at this time  Royann ShiversLynn Josetta Wigal Anna,RD,CSG,LDN Office: #[phone] Pager: [phone]

## 2016-10-02 NOTE — NC FL2 (Signed)
[place] MEDICAID FL2 LEVEL OF CARE SCREENING TOOL     IDENTIFICATION  Patient Name: Anna RubensMelba J Hanna Birthdate: [birth year] Sex: female Admission Date (Current Location): 09/30/2016  [place]  [place] [id number] L Facility and Address:  [facility],  [address], [place] [id number]      Provider Number: [id number]  Attending Physician Name and Address:  Micael HampshireEstela Y Hernandez Acost*  Relative Name and Phone Number:       Current Level of Care: Hospital Recommended Level of Care: Skilled Nursing Facility Prior Approval Number:    Date Approved/Denied:   PASRR Number: [id number]  Discharge Plan: SNF    Current Diagnoses: Patient Active Problem List   Diagnosis Date Noted  . Rhabdomyolysis 09/30/2016  . Immobility 09/30/2016  . Hypokalemia 09/30/2016  . Pressure ulcer, stage 2 09/30/2016  . Acute renal failure (HCC) 09/30/2016  . Hypothermia 09/30/2016  . Hypertension 04/11/2016  . 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 Anemia 04/02/2016  . Hyponatremia 04/02/2016  . Hyperglycemia 04/02/2016  . Femur fracture, right (HCC) 04/02/2016  . Osteoarthritis of right knee 03/30/2016  . Arthritis of knee, right     Orientation RESPIRATION BLADDER Height & Weight     Self, Situation, Place, Time  Normal Incontinent Weight: 169 lb 1.5 oz (76.7 kg) Height:  5\' 5"  (165.1 cm)  BEHAVIORAL SYMPTOMS/MOOD NEUROLOGICAL BOWEL NUTRITION STATUS  Other (Comment) (none)  (n/a) Incontinent Diet (Regular)  AMBULATORY STATUS COMMUNICATION OF NEEDS Skin   Total Care Verbally Other (Comment) (Moisture associated skin damage to buttocks. Stage II to bilateral buttocks. Unstageable to bilateral buttocks. )                       Personal Care Assistance Level of Assistance  Bathing, Feeding, Dressing Bathing Assistance: Maximum assistance Feeding assistance: Limited  assistance Dressing Assistance: Maximum assistance     Functional Limitations Info  Sight, Hearing, Speech Sight Info: Adequate Hearing Info: Adequate Speech Info: Adequate    SPECIAL CARE FACTORS FREQUENCY  PT (By licensed PT)     PT Frequency: 5              Contractures      Additional Factors Info  Code Status, Allergies Code Status Info: Full code Allergies Info: No known allergies           Current Medications (10/02/2016):  This is the current hospital active medication list Current Facility-Administered Medications  Medication Dose Route Frequency Provider Last Rate Last Dose  . 0.9 %  sodium chloride infusion   Intravenous Continuous Jonah BlueJennifer Yates, MD 150 mL/hr at 10/02/16 1159    . acetaminophen (TYLENOL) tablet 650 mg  650 mg Oral Q6H PRN Jonah BlueJennifer Yates, MD       Or  . acetaminophen (TYLENOL) suppository 650 mg  650 mg Rectal Q6H PRN Jonah BlueJennifer Yates, MD      . enoxaparin (LOVENOX) injection 40 mg  40 mg Subcutaneous Q24H Jonah BlueJennifer Yates, MD   40 mg at 10/01/16 2038  . feeding supplement (BOOST / RESOURCE BREEZE) liquid 1 Container  1 Container Oral TID BM Jonah BlueJennifer Yates, MD   1 Container at 10/01/16 2038  . latanoprost (XALATAN) 0.005 % ophthalmic solution 1 drop  1 drop Both Eyes QHS Jonah BlueJennifer Yates, MD   1 drop at 09/30/16 2109  . ondansetron (ZOFRAN) tablet 4 mg  4 mg Oral Q6H PRN Jonah BlueJennifer Yates, MD  Or  . ondansetron (ZOFRAN) injection 4 mg  4 mg Intravenous Q6H PRN Jonah Blue, MD      . oxyCODONE (Oxy IR/ROXICODONE) immediate release tablet 5 mg  5 mg Oral Q4H PRN Jonah Blue, MD   5 mg at 10/01/16 2344  . polyethylene glycol (MIRALAX / GLYCOLAX) packet 17 g  17 g Oral Daily PRN Jonah Blue, MD      . potassium chloride SA (K-DUR,KLOR-CON) CR tablet 20 mEq  20 mEq Oral Daily Jonah Blue, MD   20 mEq at 10/02/16 1103  . potassium chloride SA (K-DUR,KLOR-CON) CR tablet 20 mEq  20 mEq Oral Once Jonah Blue, MD      . senna-docusate  (Senokot-S) tablet 1 tablet  1 tablet Oral BID Jonah Blue, MD   1 tablet at 10/02/16 1102     Discharge Medications: Please see discharge summary for a list of discharge medications.  Relevant Imaging Results:  Relevant Lab Results:   Additional Information SSN: [id number]  [place], [place] [phone]

## 2016-10-02 NOTE — Evaluation (Signed)
Physical Therapy Evaluation Patient Details Name: Anna Hanna MRN: [id number] DOB: April 20, 1935 Today's Date: 10/02/2016   History of Present Illness  82 y.o. 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  She was discharged to SNF and eventually signed out AMA with a resultant contracture of that leg and significantly limited mobility.   The patient reports that she missed the couch when she attempted to sit down last night and thinks she was on the floor for approximately 10 hours before she was found by family today.   The family is concerned that she needs placement, although the patient adamantly denies this and refuses care from home health services.  Dx:  Rhabdomyolysis    Clinical Impression  Pt received in bed, and was agreeable to PT evaluation.  She states that she normally requires assistance to get in/out of the bed, and assistance for bathing, but states that she is independent with dressing.  She also initially states that she requires assistance for transfers into her w/c, but she was able to ambulate without any DME or assistance at church on Sunday.  This is highly unlikely due to the extent of the contracture to her R knee.  She also reports multiple falls in the past 6 months where she slides down off the EOB onto the floor.  During PT evaluation today, she required Max A for rolling each direction in the bed to perform peri-care after BM.  She then required Mod A for supine<>sit with HOB raised.  She was able to perform a squat pivot transfer from the bed<>chair going to the left with Mod A.  At this poin, she is not safe to discharge home due to multiple falls at home, as well as needing increased assistance for all functional mobility tasks.  Pt expressed to PT that she does not want to go to SNF.    Follow Up Recommendations SNF    Equipment Recommendations  None recommended by PT    Recommendations  for Other Services       Precautions / Restrictions Precautions Precautions: Fall Precaution Comments: Pt states she has had a lot of falls in the past 6 months.  Most of the time she is at the EOB and slides down.       Mobility  Bed Mobility Overal bed mobility: Needs Assistance Bed Mobility: Rolling;Supine to Sit Rolling: Max assist   Supine to sit: Mod assist;HOB elevated (Pt requires increased time for, and vc's for hand placement with transfer.  Once seated on EOB, she requires assistance to scoot her hips to the EOB. )        Transfers Overall transfer level: Needs assistance Equipment used: None Transfers: Squat Pivot Transfers     Squat pivot transfers: Mod assist (Going to the left.  Pt needs cues for placement of her hands for safety with transfer. )        Ambulation/Gait Ambulation/Gait assistance:  (NA)              Stairs            Wheelchair Mobility    Modified Rankin (Stroke Patients Only)       Balance Overall balance assessment: History of Falls;Needs assistance Sitting-balance support: Bilateral upper extremity supported;Feet supported Sitting balance-Leahy Scale: Good     Standing balance support: Bilateral upper extremity supported Standing balance-Leahy Scale: Poor  Pertinent Vitals/Pain Pain Assessment: 0-10 Pain Score: 4  Pain Location: R knee Pain Descriptors / Indicators: Burning Pain Intervention(s): Limited activity within patient's tolerance;Monitored during session;Repositioned    Home Living   Living Arrangements: Alone Available Help at Discharge:  (social services - bring food.  juanita king and evelyn king - sometimes assist with dressing and bathing.  Pt states they come everyday for a few hours.  ) Type of Home: Mobile home Home Access: Stairs to enter;Ramped entrance     Home Layout: One level Home Equipment: Walker - 2 wheels;Cane - single point;Bedside  commode;Wheelchair - manual      Prior Function     Gait / Transfers Assistance Needed: Pt transfers with assistance to [facility] and w/c.  She requires assistance to get in/out of bed.    ADL's / Homemaking Assistance Needed: Cousin assists with running errands.  Pt states she is independent with dressing, and assistance for dressing.          Hand Dominance   Dominant Hand: Right    Extremity/Trunk Assessment   Upper Extremity Assessment Upper Extremity Assessment: Generalized weakness    Lower Extremity Assessment Lower Extremity Assessment: Generalized weakness;RLE deficits/detail RLE Deficits / Details: Contracted into knee flexion at about 60*       Communication   Communication: No difficulties  Cognition Arousal/Alertness: Awake/alert Behavior During Therapy: WFL for tasks assessed/performed Overall Cognitive Status: Impaired/Different from baseline Area of Impairment: Orientation Orientation Level: Disoriented to;Situation             General Comments: States she is here for her fx.      General Comments General comments (skin integrity, edema, etc.): Severe Moisture Associated Skin Damage on the bilateral buttocks, or MASD (specifically, incontinence associated dermatitis, or IAD) noted and with an Unstageable pressure injury to the right ischial tuberosity.  Noted blister on R knee    Exercises     Assessment/Plan    PT Assessment Patient needs continued PT services  PT Problem List Decreased strength;Decreased range of motion;Decreased activity tolerance;Decreased balance;Decreased mobility;Decreased cognition;Decreased knowledge of use of DME;Decreased safety awareness;Decreased knowledge of precautions;Decreased skin integrity       PT Treatment Interventions DME instruction;Functional mobility training;Therapeutic activities;Therapeutic exercise;Gait training;Balance training;Patient/family education    PT Goals (Current goals can be found in the  Care Plan section)  Acute Rehab PT Goals Patient Stated Goal: None stated PT Goal Formulation: With patient Time For Goal Achievement: 10/09/16 Potential to Achieve Goals: Fair    Frequency Min 3X/week   Barriers to discharge Decreased caregiver support Pt lives alone    Co-evaluation               End of Session Equipment Utilized During Treatment: Gait belt Activity Tolerance: Patient tolerated treatment well Patient left: in chair;with call bell/phone within reach;with nursing/sitter in room Nurse Communication: Mobility status Sheria Lang, RN notified of pt's location and mobiltiy status.  Mobility sheet left hanging in the room.  Educated Charity fundraiser to turn chair 180* so pt transfers going towards her L side when she gets back to bed. ) PT Visit Diagnosis: Other abnormalities of gait and mobility (R26.89);Repeated falls (R29.6);History of falling (Z91.81);Muscle weakness (generalized) (M62.81)    Functional Assessment Tool Used: AM-PAC 6 Clicks Basic Mobility;Clinical judgement Functional Limitation: Mobility: Walking and moving around Mobility: Walking and Moving Around Current Status (Z6109): At least 60 percent but less than 80 percent impaired, limited or restricted Mobility: Walking and Moving Around Goal Status [phone]): At least 40  percent but less than 60 percent impaired, limited or restricted    Time: [id number] PT Time Calculation (min) (ACUTE ONLY): 56 min   Charges:   PT Evaluation $PT Eval Low Complexity: 1 Procedure PT Treatments $Therapeutic Activity: 23-37 mins $Self Care/Home Management: 8-22   PT G Codes:   PT G-Codes **NOT FOR INPATIENT CLASS** Functional Assessment Tool Used: AM-PAC 6 Clicks Basic Mobility;Clinical judgement Functional Limitation: Mobility: Walking and moving around Mobility: Walking and Moving Around Current Status (B1478(G8978): At least 60 percent but less than 80 percent impaired, limited or restricted Mobility: Walking and Moving Around  Goal Status [phone](G8979): At least 40 percent but less than 60 percent impaired, limited or restricted     Beth Micheal Sheen, PT, DPT X: [phone]

## 2016-10-02 NOTE — Plan of Care (Signed)
Problem: Skin Integrity: Goal: Risk for impaired skin integrity will decrease Outcome: Not Progressing Pt being turned q2h with barrier cream applied. Given Oxycodone 5mg  pain medication d/t pain on buttocks.

## 2016-10-02 NOTE — Clinical Social Work Placement (Signed)
   CLINICAL SOCIAL WORK PLACEMENT  NOTE  Date:  10/02/2016  Patient Details  Name: Anna Hanna MRN: [id number] Date of Birth: [birth year]  Clinical Social Work is seeking post-discharge placement for this patient at the Skilled  Nursing Facility level of care (*CSW will initial, date and re-position this form in  chart as items are completed):  Yes   Patient/family provided with [facility] Clinical Social Work Department's list of facilities offering this level of care within the geographic area requested by the patient (or if unable, by the patient's family).  Yes   Patient/family informed of their freedom to choose among providers that offer the needed level of care, that participate in Medicare, Medicaid or managed care program needed by the patient, have an available bed and are willing to accept the patient.  Yes   Patient/family informed of [facility]'s ownership interest in [facility] and [facility], as well as of the fact that they are under no obligation to receive care at these facilities.  PASRR submitted to EDS on       PASRR number received on       Existing PASRR number confirmed on 10/02/16     FL2 transmitted to all facilities in geographic area requested by pt/family on 10/02/16     FL2 transmitted to all facilities within larger geographic area on       Patient informed that his/her managed care company has contracts with or will negotiate with certain facilities, including the following:            Patient/family informed of bed offers received.  Patient chooses bed at       Physician recommends and patient chooses bed at      Patient to be transferred to   on  .  Patient to be transferred to facility by       Patient family notified on   of transfer.  Name of family member notified:        PHYSICIAN       Additional Comment:    _______________________________________________ Karn CassisStultz, Massey Ruhland Shanaberger, LCSW 10/02/2016, 12:16  PM [phone]

## 2016-10-02 NOTE — Clinical Social Work Note (Signed)
Clinical Social Work Assessment  Patient Details  Name: Anna Hanna MRN: [id number] Date of Birth: 06-05-1935  Date of referral:  10/02/16               Reason for consult:  Discharge Planning                Permission sought to share information with:    Permission granted to share information::     Name::        Agency::     Relationship::     Contact Information:     Housing/Transportation Living arrangements for the past 2 months:  Mobile Home Source of Information:  Patient Patient Interpreter Needed:  None Criminal Activity/Legal Involvement Pertinent to Current Situation/Hospitalization:  No - Comment as needed Significant Relationships:  Other Family Members Lives with:  Self Do you feel safe going back to the place where you live?  Yes Need for family participation in patient care:  Yes (Comment)  Care giving concerns:  Pt lives alone.    Social Worker assessment / plan:  CSW met with pt at bedside. Pt alert and oriented and reports she lives alone. She has two cousins who help her out. Pt fell and was on the floor for many hours. She uses a wheelchair at baseline. Pt states she has been to [facility] in the past and did not like it. She signed out AMA. Pt realizes that she will need assist now and is agreeable. Aware of [employer] authorization process.   Employment status:  Retired Nurse, adult PT Recommendations:  [place] / Referral to community resources:  [place]  Patient/Family's Response to care:  Pt requests placement at [facility] or [facility]   Patient/Family's Understanding of and Emotional Response to Diagnosis, Current Treatment, and Prognosis:  Pt aware of recommendation for SNF and was not interested earlier today, but now agrees.   Emotional Assessment Appearance:  Appears stated age Attitude/Demeanor/Rapport:  Other (Cooperative) Affect (typically observed):   Accepting Orientation:  Oriented to Self, Oriented to Place, Oriented to  Time, Oriented to Situation Alcohol / Substance use:  Not Applicable Psych involvement (Current and /or in the community):  No (Comment)  Discharge Needs  Concerns to be addressed:  Discharge Planning Concerns Readmission within the last 30 days:  No Current discharge risk:  Lives alone Barriers to Discharge:  Continued Medical Work up   [employer], [employer], [place] 10/02/2016, 12:17 PM [phone]

## 2016-10-03 LAB — URINE CULTURE

## 2016-10-03 LAB — CK: Total CK: 329 U/L — ABNORMAL HIGH (ref 38–234)

## 2016-10-03 MED ORDER — OXYCODONE HCL 5 MG PO TABS: 5.0000 mg | ORAL_TABLET | ORAL | 0 refills | Status: DC | PRN

## 2016-10-03 MED ORDER — ADULT MULTIVITAMIN W/MINERALS CH: 1.0000 | ORAL_TABLET | Freq: Every day | ORAL | Status: DC

## 2016-10-03 NOTE — Care Management Important Message (Signed)
Important Message  Patient Details  Name: Lysle RubensMelba J Fleig MRN: [id number] Date of Birth: 1934/11/04   Medicare Important Message Given:  Yes    Malcolm MetroChildress, Lavilla Delamora Demske, RN 10/03/2016, 12:06 PM

## 2016-10-03 NOTE — Progress Notes (Signed)
Physical Therapy Treatment Patient Details Name: Anna RubensMelba J Hanna MRN: [id number] DOB: 10-24-1934 Today's Date: 10/03/2016    History of Present Illness 82 y.o. 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  She was discharged to SNF and eventually signed out AMA with a resultant contracture of that leg and significantly limited mobility.   The patient reports that she missed the couch when she attempted to sit down last night and thinks she was on the floor for approximately 10 hours before she was found by family today.   The family is concerned that she needs placement, although the patient adamantly denies this and refuses care from home health services.  Dx:  Rhabdomyolysis    PT Comments    Pt received in bed, and was agreeable to PT tx.  RN present during tx to assess wounds and apply barrier cream on buttocks prior to transfer.  Pt requires Max A for rolling each direction in the bed.  She then required Min A for supine<>sit, and Mod A for squat pivot from bed<>chair going to the left.  She requires repetition of cues frequently - uncertain if she is [facility] or has difficulty understanding the cues.  She seems to have periods where she does not understand what is happening, and then other times where she follows commands very quickly.  She continues to demonstrate significant contracture of L knee into a flexed position.  She has great difficulty with relaxation and breathing during exercises and mobility.  Continue to recommend SNF due to poor ability to care for herself when she lives alone, as well as multiple falls.    Follow Up Recommendations  SNF     Equipment Recommendations  None recommended by PT    Recommendations for Other Services       Precautions / Restrictions Precautions Precautions: Fall Precaution Comments: Pt states she has had a lot of falls in the past 6 months.  Most of the time she is at  the EOB and slides down.     Mobility  Bed Mobility   Bed Mobility: Rolling Rolling: Max assist   Supine to sit: Min assist (increased time - pt comes into long sitting with heavy use of bed rail)        Transfers Overall transfer level: Needs assistance Equipment used: None Transfers: Squat Pivot Transfers     Squat pivot transfers: Mod assist (Going to the left with cues for hand placement.  )        Ambulation/Gait                 Stairs            Wheelchair Mobility    Modified Rankin (Stroke Patients Only)       Balance Overall balance assessment: History of Falls;Needs assistance Sitting-balance support: Bilateral upper extremity supported;Feet supported Sitting balance-Leahy Scale: Good     Standing balance support: Bilateral upper extremity supported Standing balance-Leahy Scale: Poor                      Cognition Arousal/Alertness: Awake/alert Behavior During Therapy: WFL for tasks assessed/performed                   General Comments: Pt does not follow conversation very well - uncertain if it is due to [facility], or cognition.      Exercises Total Joint Exercises Bridges: Strengthening;5 reps;Limitations Jabil CircuitBridges  Limitations: Extensive cues for increased B knee flexion and keeping feet on the bed.  Pt is not able to lift buttocks up off the bed.   General Exercises - Lower Extremity Heel Slides: AAROM;Both;10 reps;Limitations Heel Slides Limitations: R LE requires increased assistance for ROM Straight Leg Raises: AAROM;Both;5 reps    General Comments        Pertinent Vitals/Pain Pain Assessment: Faces Pain Location: R knee Pain Descriptors / Indicators: Grimacing;Guarding    Home Living                      Prior Function            PT Goals (current goals can now be found in the care plan section) Acute Rehab PT Goals Patient Stated Goal: None stated PT Goal Formulation: With patient Time For  Goal Achievement: 10/09/16 Potential to Achieve Goals: Fair Progress towards PT goals: Progressing toward goals    Frequency    Min 3X/week      PT Plan Current plan remains appropriate    Co-evaluation             End of Session Equipment Utilized During Treatment: Gait belt Activity Tolerance: Patient tolerated treatment well Patient left: in chair;with call bell/phone within reach;with family/visitor present Nurse Communication: Mobility status PT Visit Diagnosis: Other abnormalities of gait and mobility (R26.89);Repeated falls (R29.6);History of falling (Z91.81);Muscle weakness (generalized) (M62.81)     Time: 1610-9604 PT Time Calculation (min) (ACUTE ONLY): 42 min  Charges:  $Therapeutic Exercise: 8-22 mins $Therapeutic Activity: 23-37 mins                    G Codes:       Beth Griff Badley, PT, DPT X: [phone]

## 2016-10-03 NOTE — Progress Notes (Signed)
Lorisa Phillips GroutJ Heeney discharged to Skilled nursing facility per MD order.  Discharge instructions reviewed and discussed with the patient, all questions and concerns answered. Copy of instructions and scripts sent with pt via EMS.  Allergies as of 10/03/2016   No Known Allergies     Medication List    STOP taking these medications   hydrochlorothiazide 12.5 MG capsule Commonly known as:  MICROZIDE     TAKE these medications   acetaminophen 500 MG tablet Commonly known as:  TYLENOL Take 1,000 mg by mouth every 6 (six) hours as needed for mild pain or moderate pain.   amLODipine 5 MG tablet Commonly known as:  NORVASC Take 1 tablet by mouth Daily.   BIOFREEZE 4 % Gel Generic drug:  Menthol (Topical Analgesic) Apply 1 application topically daily. To be used after therapy   calcium citrate 950 MG tablet Commonly known as:  CALCITRATE - dosed in mg elemental calcium Take 200 mg of elemental calcium by mouth daily.   iron polysaccharides 150 MG capsule Commonly known as:  NIFEREX Take 150 mg by mouth daily.   latanoprost 0.005 % ophthalmic solution Commonly known as:  XALATAN Place 1 drop into both eyes at bedtime.   multivitamin with minerals Tabs tablet Take 1 tablet by mouth daily. Start taking on:  10/04/2016   oxyCODONE 5 MG immediate release tablet Commonly known as:  Oxy IR/ROXICODONE Take 1 tablet (5 mg total) by mouth every 4 (four) hours as needed for severe pain.   polyethylene glycol packet Commonly known as:  MIRALAX / GLYCOLAX Take 17 g by mouth daily as needed for mild constipation or moderate constipation.   Potassium Chloride ER 20 MEQ Tbcr Take 20 mEq by mouth daily at 6 (six) AM.   senna-docusate 8.6-50 MG tablet Commonly known as:  Senokot-S Take 1 tablet by mouth 2 (two) times daily.   vitamin C 500 MG tablet Commonly known as:  ASCORBIC ACID Take 500 mg by mouth 2 (two) times daily.   Vitamin D (Cholecalciferol) 1000 units Tabs Give 1 tablet by  mouth daily        IV site discontinued and catheter remains intact. Site without signs and symptoms of complications. Dressing and pressure applied.  [facility]. Report called to [place] CreekJodi, LPN.  Rica KoyanagiBonnie M Kyeshia Zinn 10/03/2016 4:20 PM

## 2016-10-03 NOTE — Progress Notes (Signed)
At 0008 wound care completed and pt turned to left side. Pt moaned and complained of pain due to us moving her and asked if we would not perform wound care at the 0200 hour. I explained to pt the need to complete wound care and reposition Q2H, and told her I would skip the 0200 wound care if she is not soiled but that I would be back at 0400.

## 2016-10-03 NOTE — Clinical Social Work Placement (Addendum)
   CLINICAL SOCIAL WORK PLACEMENT  NOTE  Date:  10/03/2016  Patient Details  Name: Anna Hanna MRN: [id number] Date of Birth: 01-26-1935  Clinical Social Work is seeking post-discharge placement for this patient at the Skilled  Nursing Facility level of care (*CSW will initial, date and re-position this form in  chart as items are completed):  Yes   Patient/family provided with [facility] Clinical Social Work Department's list of facilities offering this level of care within the geographic area requested by the patient (or if unable, by the patient's family).  Yes   Patient/family informed of their freedom to choose among providers that offer the needed level of care, that participate in Medicare, Medicaid or managed care program needed by the patient, have an available bed and are willing to accept the patient.  Yes   Patient/family informed of [facility]'s ownership interest in [facility] and [facility], as well as of the fact that they are under no obligation to receive care at these facilities.  PASRR submitted to EDS on       PASRR number received on       Existing PASRR number confirmed on 10/02/16     FL2 transmitted to all facilities in geographic area requested by pt/family on 10/02/16     FL2 transmitted to all facilities within larger geographic area on       Patient informed that his/her managed care company has contracts with or will negotiate with certain facilities, including the following:        Yes   Patient/family informed of bed offers received.  [facility]     Physician recommends and patient chooses bed at      [facility] on 10/03/16.  [facility]     Patient family notified on 10/03/16 of transfer.  Name of family member notified:  Burnell BlanksJuanita, Evelyn- cousins      PHYSICIAN       Additional Comment:  Facility received authorization.    _______________________________________________ Karn CassisStultz, Janus Vlcek Shanaberger, LCSW 10/03/2016, 12:41 PM [phone]

## 2016-10-03 NOTE — Discharge Summary (Signed)
Physician Discharge Summary  Anna Hanna ZOX:[id number] DOB: 08-Feb-1935 DOA: 09/30/2016  PCP: Milana Obey, MD  Admit date: 09/30/2016 Discharge date: 10/03/2016  Time spent: 45 minutes  Recommendations for Outpatient Follow-up:  -Will be discharged to SNF.   Discharge Diagnoses:  Principal Problem:   Rhabdomyolysis Active Problems:   Hyperglycemia   Immobility   Hypokalemia   Pressure ulcer, stage 2   Acute renal failure (HCC)   Hypothermia   Discharge Condition: Stable and improved  Filed Weights   09/30/16 1334 09/30/16 2027 10/01/16 0500  Weight: 95.3 kg (210 lb) 77 kg (169 lb 12.1 oz) 76.7 kg (169 lb 1.5 oz)    History of present illness:  As per Dr. Ophelia Charter on 3/10: Anna Hanna is a 82 y.o. 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  She was discharged to SNF and eventually signed out AMA with a resultant contracture of that leg and significantly limited mobility.   The patient reports that she missed the couch when she attempted to sit down last night and thinks she was on the floor for approximately 10 hours before she was found by family today.   The family is concerned that she needs placement, although the patient adamantly denies this and refuses care from home health services.  Further history limited by the patient's overall condition - she was on a Lawyer with hypothermia - and by possible mild cognitive deficit.  ED Course: Patient with hypothermia, treated with a Lawyer.  Also CK elevation and mild AKI.    Hospital Course:   Rhabdomyolysis -CPK improving with IV fluids, will continue today. -CPK down to 300.  Acute renal failure -resolved with IV fluids.  Hypokalemia -Repleted orally. -Mg ok at 1.8.  Weakness/deconditioning -To SNF today.  Procedures:  None   Consultations:  None  Discharge Instructions  Discharge Instructions    Diet - low sodium heart healthy    Complete by:  As directed    Increase activity slowly    Complete by:  As directed      Allergies as of 10/03/2016   No Known Allergies     Medication List    STOP taking these medications   hydrochlorothiazide 12.5 MG capsule Commonly known as:  MICROZIDE     TAKE these medications   acetaminophen 500 MG tablet Commonly known as:  TYLENOL Take 1,000 mg by mouth every 6 (six) hours as needed for mild pain or moderate pain.   amLODipine 5 MG tablet Commonly known as:  NORVASC Take 1 tablet by mouth Daily.   BIOFREEZE 4 % Gel Generic drug:  Menthol (Topical Analgesic) Apply 1 application topically daily. To be used after therapy   calcium citrate 950 MG tablet Commonly known as:  CALCITRATE - dosed in mg elemental calcium Take 200 mg of elemental calcium by mouth daily.   iron polysaccharides 150 MG capsule Commonly known as:  NIFEREX Take 150 mg by mouth daily.   latanoprost 0.005 % ophthalmic solution Commonly known as:  XALATAN Place 1 drop into both eyes at bedtime.   multivitamin with minerals Tabs tablet Take 1 tablet by mouth daily. Start taking on:  10/04/2016   oxyCODONE 5 MG immediate release tablet Commonly known as:  Oxy IR/ROXICODONE Take 1 tablet (5 mg total) by mouth every 4 (four) hours as needed for severe pain.   polyethylene glycol packet Commonly known as:  MIRALAX / GLYCOLAX Take 17 g by mouth daily as needed for mild constipation or moderate constipation.   Potassium Chloride ER 20 MEQ Tbcr Take 20 mEq by mouth daily at 6 (six) AM.   senna-docusate 8.6-50 MG tablet Commonly known as:  Senokot-S Take 1 tablet by mouth 2 (two) times daily.   vitamin C 500 MG tablet Commonly known as:  ASCORBIC ACID Take 500 mg by mouth 2 (two) times daily.   Vitamin D (Cholecalciferol) 1000 units Tabs Give 1 tablet by mouth daily      No Known Allergies Contact information for after-discharge care    Destination     [facility] .   Specialty:  Skilled Nursing Facility Contact information: [address] [place] [address] [phone]               The results of significant diagnostics from this hospitalization (including imaging, microbiology, ancillary and laboratory) are listed below for reference.    Significant Diagnostic Studies: Dg Chest 2 View  Result Date: 09/30/2016 CLINICAL DATA:  Frequent falls. EXAM: CHEST  2 VIEW COMPARISON:  08/10/2012 FINDINGS: AP and lateral views of the chest were obtained. Lungs are hyperexpanded. The lungs are clear wiithout focal pneumonia, edema, pneumothorax or pleural effusion. Interstitial markings are diffusely coarsened with chronic features. The cardiopericardial silhouette is within normal limits for size. The visualized bony structures of the thorax are intact. Telemetry leads overlie the chest. IMPRESSION: Stable.  No acute findings. Stable chronic interstitial coarsening. Electronically Signed   By: Kennith Center M.D.   On: 09/30/2016 14:58   Dg Pelvis 1-2 Views  Result Date: 09/30/2016 CLINICAL DATA:  Frequent falls EXAM: PELVIS - 1-2 VIEW COMPARISON:  None. FINDINGS: No fracture or dislocation is seen. Bilateral hip joint spaces are mildly narrowed but symmetric. Proximal aspect of the right knee arthroplasty is visualized, without evidence of complication. IMPRESSION: Negative. Electronically Signed   By: Charline Bills M.D.   On: 09/30/2016 15:02   Ct Head Wo Contrast  Result Date: 09/30/2016 CLINICAL DATA:  Fall EXAM: CT HEAD WITHOUT CONTRAST TECHNIQUE: Contiguous axial images were obtained from the base of the skull through the vertex without intravenous contrast. COMPARISON:  None. FINDINGS: Brain: No evidence of acute infarction, hemorrhage, hydrocephalus, extra-axial collection or mass lesion/mass effect. Subcortical white matter and periventricular small vessel ischemic changes. Vascular: Intracranial  atherosclerosis. Skull: Normal. Negative for fracture or focal lesion. Sinuses/Orbits: The visualized paranasal sinuses are essentially clear. The mastoid air cells are unopacified. Other: None. IMPRESSION: No evidence of acute intracranial abnormality. Small vessel ischemic changes. Electronically Signed   By: Charline Bills M.D.   On: 09/30/2016 15:26   Dg Knee Complete 4 Views Right  Result Date: 09/30/2016 CLINICAL DATA:  Multiple falls, status post knee replacement in September 2017 EXAM: RIGHT KNEE - COMPLETE 4+ VIEW COMPARISON:  None. FINDINGS: Status post right knee arthroplasty. Stable appearance along the proximal aspect of the prosthesis when compared to December 2017. No evidence of acute fracture. No suprapatellar knee joint effusion. Visualized soft tissues are within normal limits. IMPRESSION: Status post right knee arthroplasty, without interval change. No fracture or dislocation is seen. Electronically Signed   By: Charline Bills M.D.   On: 09/30/2016 15:01    Microbiology: Recent Results (from the past 240 hour(s))  Urine culture     Status: Abnormal   Collection Time: 09/30/16  1:51 PM  Result Value Ref Range Status   Specimen Description URINE, CLEAN CATCH  Final   Special Requests NONE  Final   Culture (A)  Final    >=100,000 COLONIES/mL ESCHERICHIA COLI >=100,000 COLONIES/mL KLEBSIELLA PNEUMONIAE    Report Status 10/03/2016 FINAL  Final   Organism ID, Bacteria ESCHERICHIA COLI (A)  Final   Organism ID, Bacteria KLEBSIELLA PNEUMONIAE (A)  Final      Susceptibility   Escherichia coli - MIC*    AMPICILLIN <=2 SENSITIVE Sensitive     CEFAZOLIN <=4 SENSITIVE Sensitive     CEFTRIAXONE <=1 SENSITIVE Sensitive     CIPROFLOXACIN <=0.25 SENSITIVE Sensitive     GENTAMICIN <=1 SENSITIVE Sensitive     IMIPENEM <=0.25 SENSITIVE Sensitive     NITROFURANTOIN <=16 SENSITIVE Sensitive     TRIMETH/SULFA <=20 SENSITIVE Sensitive     AMPICILLIN/SULBACTAM <=2 SENSITIVE Sensitive      PIP/TAZO <=4 SENSITIVE Sensitive     Extended ESBL NEGATIVE Sensitive     * >=100,000 COLONIES/mL ESCHERICHIA COLI   Klebsiella pneumoniae - MIC*    AMPICILLIN >=32 RESISTANT Resistant     CEFAZOLIN <=4 SENSITIVE Sensitive     CEFTRIAXONE <=1 SENSITIVE Sensitive     CIPROFLOXACIN <=0.25 SENSITIVE Sensitive     GENTAMICIN <=1 SENSITIVE Sensitive     IMIPENEM <=0.25 SENSITIVE Sensitive     NITROFURANTOIN <=16 SENSITIVE Sensitive     TRIMETH/SULFA <=20 SENSITIVE Sensitive     AMPICILLIN/SULBACTAM 4 SENSITIVE Sensitive     PIP/TAZO <=4 SENSITIVE Sensitive     Extended ESBL NEGATIVE Sensitive     * >=100,000 COLONIES/mL KLEBSIELLA PNEUMONIAE  Blood culture (routine x 2)     Status: None (Preliminary result)   Collection Time: 09/30/16  3:26 PM  Result Value Ref Range Status   Specimen Description BLOOD BLOOD LEFT HAND  Final   Special Requests BOTTLES DRAWN AEROBIC AND ANAEROBIC 8CC  Final   Culture NO GROWTH 3 DAYS  Final   Report Status PENDING  Incomplete  Blood culture (routine x 2)     Status: None (Preliminary result)   Collection Time: 09/30/16  3:36 PM  Result Value Ref Range Status   Specimen Description BLOOD BLOOD RIGHT HAND  Final   Special Requests BOTTLES DRAWN AEROBIC AND ANAEROBIC 8CC  Final   Culture NO GROWTH 3 DAYS  Final   Report Status PENDING  Incomplete  MRSA PCR Screening     Status: None   Collection Time: 09/30/16  7:15 PM  Result Value Ref Range Status   MRSA by PCR NEGATIVE NEGATIVE Final    Comment:        The GeneXpert MRSA Assay (FDA approved for NASAL specimens only), is one component of a comprehensive MRSA colonization surveillance program. It is not intended to diagnose MRSA infection nor to guide or monitor treatment for MRSA infections.      Labs: Basic Metabolic Panel:  Recent Labs Lab 09/30/16 1348 10/01/16 0556 10/02/16 0441  NA 137 138 138  K 3.1* 2.5* 4.0  CL 96* 103 110  CO2 29 25 22   GLUCOSE 126* 89 109*  BUN 34*  24* 12  CREATININE 1.10* 0.77 0.55  CALCIUM 9.3 8.0* 7.8*  MG  --  1.8  --    Liver Function Tests:  Recent Labs Lab 09/30/16 1348 10/01/16 0556  AST 80* 83*  ALT 35 33  ALKPHOS 101 72  BILITOT 0.7 0.8  PROT 7.4 5.5*  ALBUMIN 3.3* 2.4*   No results for input(s): LIPASE, AMYLASE in the last 168 hours. No results for input(s):  AMMONIA in the last 168 hours. CBC:  Recent Labs Lab 09/30/16 1348 10/01/16 0556 10/02/16 0441  WBC 9.5 10.2 6.9  NEUTROABS 7.6  --   --   HGB 12.5 10.0* 9.3*  HCT 37.7 30.3* 28.5*  MCV 75.6* 75.9* 76.4*  PLT 451* 432* 428*   Cardiac Enzymes:  Recent Labs Lab 09/30/16 1348 10/01/16 0556 10/02/16 0441 10/03/16 0430  CKTOTAL 2,238* 2,035* 811* 329*   BNP: BNP (last 3 results) No results for input(s): BNP in the last 8760 hours.  ProBNP (last 3 results) No results for input(s): PROBNP in the last 8760 hours.  CBG: No results for input(s): GLUCAP in the last 168 hours.     SignedChaya Jan  [facility] Pager: [phone] 10/03/2016, 12:01 PM

## 2016-10-05 ENCOUNTER — Encounter: Payer: Self-pay | Admitting: Internal Medicine

## 2016-10-05 ENCOUNTER — Non-Acute Institutional Stay (SKILLED_NURSING_FACILITY): Payer: Medicare HMO | Admitting: Internal Medicine

## 2016-10-05 DIAGNOSIS — K5900 Constipation, unspecified: Secondary | ICD-10-CM

## 2016-10-05 DIAGNOSIS — I10 Essential (primary) hypertension: Secondary | ICD-10-CM

## 2016-10-05 DIAGNOSIS — R29898 Other symptoms and signs involving the musculoskeletal system: Secondary | ICD-10-CM | POA: Diagnosis not present

## 2016-10-05 DIAGNOSIS — E876 Hypokalemia: Secondary | ICD-10-CM | POA: Diagnosis not present

## 2016-10-05 DIAGNOSIS — R531 Weakness: Secondary | ICD-10-CM

## 2016-10-05 DIAGNOSIS — Z96651 Presence of right artificial knee joint: Secondary | ICD-10-CM

## 2016-10-05 DIAGNOSIS — D509 Iron deficiency anemia, unspecified: Secondary | ICD-10-CM

## 2016-10-05 DIAGNOSIS — L8945 Pressure ulcer of contiguous site of back, buttock and hip, unstageable: Secondary | ICD-10-CM

## 2016-10-05 DIAGNOSIS — T796XXS Traumatic ischemia of muscle, sequela: Secondary | ICD-10-CM | POA: Diagnosis not present

## 2016-10-05 LAB — CULTURE, BLOOD (ROUTINE X 2)
CULTURE: NO GROWTH
CULTURE: NO GROWTH

## 2016-10-05 NOTE — Progress Notes (Signed)
LOCATION: [facility]  PCP: Milana Obey, MD   Code Status: Full Code  Goals of care: Advanced Directive information Advanced Directives 10/03/2016  Does Patient Have a Medical Advance Directive? No  Type of Advance Directive -  Does patient want to make changes to medical advance directive? -  Copy of Healthcare Power of Attorney in Chart? -  Would patient like information on creating a medical advance directive? No - Patient declined  Pre-existing out of facility DNR order (yellow form or pink MOST form) -       Extended Emergency Contact Information Primary Emergency Contact: [name], [place] [address] [place] Home Phone: [phone] Relation: Relative Secondary Emergency Contact: King,Juanita          [place], [place] [address] [place] Home Phone: [phone] Relation: Relative   No Known Allergies  Chief Complaint  Patient presents with  . New Admit To SNF    New Admission Visit      HPI:  Patient is a 82 y.o. female seen today for short term rehabilitation post hospital admission from 09/30/16-10/03/16 post fall with rhabdomylosis, hypothermia and acute renal failure. She received iv fluids and her lytes were repleted. She was treated for pressure ulcer. She is seen in her room today. She has PMH of HTN, HLD, right TKA with contracture of right leg.  Review of Systems:  Constitutional: Negative for fever, chills, diaphoresis. Energy level is slowly coming back.  HENT: Negative for headache, congestion, nasal discharge, difficulty swallowing.   Eyes: Negative for blurred vision, double vision and discharge.  Respiratory: Negative for cough, shortness of breath and wheezing.   Cardiovascular: Negative for chest pain, palpitations  Gastrointestinal: Negative for heartburn,vomiting, abdominal pain, loss of appetite. Positive for nausea. Last bowel movement was this morning. Genitourinary: has a foley catheter in  place Musculoskeletal: Negative for fall in the facility.  Skin: Negative for itching, rash.  Neurological: Negative for dizziness. Psychiatric/Behavioral: Negative for depression.    Past Medical History:  Diagnosis Date  . Glaucoma   . Hypercholesteremia   . Hypertension   . Hypokalemia   . Status post total right knee replacement    Past Surgical History:  Procedure Laterality Date  . ABDOMINAL HYSTERECTOMY    . CATARACT EXTRACTION    . COLONOSCOPY N/A 12/10/2013   Procedure: COLONOSCOPY;  Surgeon: West Bali, MD;  Location: [facility];  Service: Endoscopy;  Laterality: N/A;  8:30 AM  . TOTAL KNEE ARTHROPLASTY Right 03/30/2016   Procedure: TOTAL KNEE ARTHROPLASTY;  Surgeon: Vickki Hearing, MD;  Location: [facility];  Service: Orthopedics;  Laterality: Right;   Social History:   reports that she has been smoking Cigarettes.  She has a 0.75 pack-year smoking history. She has never used smokeless tobacco. She reports that she does not drink alcohol or use drugs.  Family History  Problem Relation Age of Onset  . Cancer Mother   . Arthritis Mother   . Hypotension Father   . Cancer Sister   . Hypotension Sister   . Colon cancer Neg Hx     Medications: Allergies as of 10/05/2016   No Known Allergies     Medication List       Accurate as of 10/05/16  2:58 PM. Always use your most recent med list.          acetaminophen 500 MG tablet Commonly known as:  TYLENOL Take  1,000 mg by mouth every 6 (six) hours as needed for mild pain or moderate pain.   amLODipine 5 MG tablet Commonly known as:  NORVASC Take 1 tablet by mouth Daily.   BIOFREEZE 4 % Gel Generic drug:  Menthol (Topical Analgesic) Apply 1 application topically daily. To be used after therapy   calcium citrate 950 MG tablet Commonly known as:  CALCITRATE - dosed in mg elemental calcium Take 200 mg of elemental calcium by mouth daily.   iron polysaccharides 150 MG capsule Commonly known as:   NIFEREX Take 150 mg by mouth daily.   latanoprost 0.005 % ophthalmic solution Commonly known as:  XALATAN Place 1 drop into both eyes at bedtime.   multivitamin with minerals Tabs tablet Take 1 tablet by mouth daily.   oxyCODONE 5 MG immediate release tablet Commonly known as:  Oxy IR/ROXICODONE Take 1 tablet (5 mg total) by mouth every 4 (four) hours as needed for severe pain.   polyethylene glycol packet Commonly known as:  MIRALAX / GLYCOLAX Take 17 g by mouth daily as needed for mild constipation or moderate constipation.   Potassium Chloride ER 20 MEQ Tbcr Take 20 mEq by mouth daily at 6 (six) AM.   senna-docusate 8.6-50 MG tablet Commonly known as:  Senokot-S Take 1 tablet by mouth 2 (two) times daily.   vitamin C 500 MG tablet Commonly known as:  ASCORBIC ACID Take 500 mg by mouth 2 (two) times daily.   Vitamin D (Cholecalciferol) 1000 units Tabs Give 1 tablet by mouth daily       Immunizations: Immunization History  Administered Date(s) Administered  . PPD Test 10/03/2016     Physical Exam: Vitals:   10/05/16 1415  BP: 126/68  Pulse: 88  Resp: 16  Temp: 97.9 F (36.6 C)  TempSrc: Oral  SpO2: 96%  Weight: 188 lb 9.6 oz (85.5 kg)   Body mass index is 31.38 kg/m.  General- elderly female, obese, in no acute distress Head- normocephalic, atraumatic Nose- no nasal discharge Throat- moist mucus membrane Eyes- PERRLA, EOMI, no pallor, no icterus, no discharge Neck- no cervical lymphadenopathy Cardiovascular- normal s1,s2, no murmur Respiratory- bilateral clear to auscultation, no wheeze, no rhonchi, no crackles, no use of accessory muscles Abdomen- bowel sounds present, soft, non tender, foley catheter in place Musculoskeletal- able to move all 4 extremities, limited ROM to both knee right > left, right leg contracture, generalized weakness Neurological- alert and oriented to person, place and month but not to year, slow with conversation Skin-  warm and dry Psychiatry- normal mood   Labs reviewed: Basic Metabolic Panel:  Recent Labs  29/56/2109/21/17 0720  04/20/16 0740  09/30/16 1348 10/01/16 0556 10/02/16 0441  NA 133*  < > 135  < > 137 138 138  K 3.1*  < > 4.2  < > 3.1* 2.5* 4.0  CL 95*  < > 101  < > 96* 103 110  CO2 30  < > 29  < > 29 25 22   GLUCOSE 121*  < > 116*  < > 126* 89 109*  BUN 12  < > 8  < > 34* 24* 12  CREATININE 0.55  < > 0.43*  < > 1.10* 0.77 0.55  CALCIUM 9.1  < > 8.5*  < > 9.3 8.0* 7.8*  MG 1.5*  --  1.9  --   --  1.8  --   < > = values in this interval not displayed. Liver Function Tests:  Recent Labs  04/17/16 0830 09/30/16 1348 10/01/16 0556  AST 15 80* 83*  ALT 15 35 33  ALKPHOS 122 101 72  BILITOT 0.4 0.7 0.8  PROT 6.1* 7.4 5.5*  ALBUMIN 2.7* 3.3* 2.4*   No results for input(s): LIPASE, AMYLASE in the last 8760 hours. No results for input(s): AMMONIA in the last 8760 hours. CBC:  Recent Labs  05/05/16 1819 05/30/16 0700 09/30/16 1348 10/01/16 0556 10/02/16 0441  WBC 9.2 8.0 9.5 10.2 6.9  NEUTROABS 5.8 4.8 7.6  --   --   HGB 10.2* 11.6* 12.5 10.0* 9.3*  HCT 32.5* 37.0 37.7 30.3* 28.5*  MCV 82.1 81.0 75.6* 75.9* 76.4*  PLT 433* 464* 451* 432* 428*   Cardiac Enzymes:  Recent Labs  10/01/16 0556 10/02/16 0441 10/03/16 0430  CKTOTAL 2,035* 811* 329*   BNP: Invalid input(s): POCBNP CBG: No results for input(s): GLUCAP in the last 8760 hours.  Radiological Exams: Dg Chest 2 View  Result Date: 09/30/2016 CLINICAL DATA:  Frequent falls. EXAM: CHEST  2 VIEW COMPARISON:  08/10/2012 FINDINGS: AP and lateral views of the chest were obtained. Lungs are hyperexpanded. The lungs are clear wiithout focal pneumonia, edema, pneumothorax or pleural effusion. Interstitial markings are diffusely coarsened with chronic features. The cardiopericardial silhouette is within normal limits for size. The visualized bony structures of the thorax are intact. Telemetry leads overlie the chest.  IMPRESSION: Stable.  No acute findings. Stable chronic interstitial coarsening. Electronically Signed   By: Kennith Center M.D.   On: 09/30/2016 14:58   Dg Pelvis 1-2 Views  Result Date: 09/30/2016 CLINICAL DATA:  Frequent falls EXAM: PELVIS - 1-2 VIEW COMPARISON:  None. FINDINGS: No fracture or dislocation is seen. Bilateral hip joint spaces are mildly narrowed but symmetric. Proximal aspect of the right knee arthroplasty is visualized, without evidence of complication. IMPRESSION: Negative. Electronically Signed   By: Charline Bills M.D.   On: 09/30/2016 15:02   Ct Head Wo Contrast  Result Date: 09/30/2016 CLINICAL DATA:  Fall EXAM: CT HEAD WITHOUT CONTRAST TECHNIQUE: Contiguous axial images were obtained from the base of the skull through the vertex without intravenous contrast. COMPARISON:  None. FINDINGS: Brain: No evidence of acute infarction, hemorrhage, hydrocephalus, extra-axial collection or mass lesion/mass effect. Subcortical white matter and periventricular small vessel ischemic changes. Vascular: Intracranial atherosclerosis. Skull: Normal. Negative for fracture or focal lesion. Sinuses/Orbits: The visualized paranasal sinuses are essentially clear. The mastoid air cells are unopacified. Other: None. IMPRESSION: No evidence of acute intracranial abnormality. Small vessel ischemic changes. Electronically Signed   By: Charline Bills M.D.   On: 09/30/2016 15:26   Dg Knee Complete 4 Views Right  Result Date: 09/30/2016 CLINICAL DATA:  Multiple falls, status post knee replacement in September 2017 EXAM: RIGHT KNEE - COMPLETE 4+ VIEW COMPARISON:  None. FINDINGS: Status post right knee arthroplasty. Stable appearance along the proximal aspect of the prosthesis when compared to December 2017. No evidence of acute fracture. No suprapatellar knee joint effusion. Visualized soft tissues are within normal limits. IMPRESSION: Status post right knee arthroplasty, without interval change. No fracture  or dislocation is seen. Electronically Signed   By: Charline Bills M.D.   On: 09/30/2016 15:01    Assessment/Plan  Generalized weakness Will have her work with physical therapy and occupational therapy team to help with gait training and muscle strengthening exercises.fall precautions. Skin care. Encourage to be out of bed.   rhabdomyolosis s/p iv fluids and ck trended down prior to discharge. Fall precautions. Maintain hydration  Right leg weakness With contracture post right TKA. Will have patient work with PT/OT as tolerated to regain strength and restore function.  Fall precautions are in place.  Right knee OA s/p total knee arthroplasty, now has right leg contracture. Continue biofreeze, roxicodone 5 mg q4h prn pain, tylenol 1000 mg q6h prn pain. Will have patient work with PT/OT as tolerated to regain strength and restore function.  Fall precautions are in place.  unstageable pressure ulcer To sacrum, coccyx, buttock and thighs. Provide wound care, air loss mattress. Add decubivite to promote wound healing. Foley catheter to promote wound healing.   Hypokalemia Continue kcl supplement and check bmp  Constipation Continue senna s bid and miralax daily as needed  HTN Monitor bp, continue norvasc and check bmp  Iron def anemia Continue niferex 150 mg daily, cbc check    Goals of care: short term rehabilitation   Labs/tests ordered: cbc, bmp 10/09/16  Family/ staff Communication: reviewed care plan with patient and nursing supervisor    Oneal Grout, MD Internal Medicine [facility] [address] [place], [place] [address] Cell Phone (Monday-Friday 8 am - 5 pm): [phone] On Call: [phone] and follow prompts after 5 pm and on weekends Office Phone: [phone] Office Fax: [phone]

## 2016-10-09 LAB — CBC AND DIFFERENTIAL
HCT: 34 % — AB (ref 36–46)
Hemoglobin: 10.8 g/dL — AB (ref 12.0–16.0)
PLATELETS: 440 10*3/uL — AB (ref 150–399)
WBC: 6.6 10*3/mL

## 2016-10-09 LAB — BASIC METABOLIC PANEL
BUN: 8 mg/dL (ref 4–21)
CREATININE: 0.5 mg/dL (ref 0.5–1.1)
Glucose: 94 mg/dL
Potassium: 4.6 mmol/L (ref 3.4–5.3)
Sodium: 136 mmol/L — AB (ref 137–147)

## 2016-11-07 ENCOUNTER — Non-Acute Institutional Stay (SKILLED_NURSING_FACILITY): Payer: Medicare HMO | Admitting: Family

## 2016-11-07 ENCOUNTER — Encounter: Payer: Self-pay | Admitting: Family

## 2016-11-07 DIAGNOSIS — I10 Essential (primary) hypertension: Secondary | ICD-10-CM | POA: Diagnosis not present

## 2016-11-07 DIAGNOSIS — E669 Obesity, unspecified: Secondary | ICD-10-CM

## 2016-11-07 DIAGNOSIS — R2681 Unsteadiness on feet: Secondary | ICD-10-CM

## 2016-11-07 NOTE — Progress Notes (Signed)
Location:  [facility] Room Number: 103 Place of Service:  SNF [phone]) Provider: Elijan Googe FNP-C   Milana Obey, MD  Patient Care Team: [name], MD as PCP - General [facility])  Extended Emergency Contact Information Primary Emergency Contact: [name], [place] [address] [place] of [place] Home Phone: [phone] Relation: Relative Secondary Emergency Contact: [name], [place] [address] [place] of [place] Home Phone: [phone] Relation: Relative  Code Status:  Full Code  Goals of care: Advanced Directive information Advanced Directives 11/07/2016  Does Patient Have a Medical Advance Directive? No  Type of Advance Directive -  Does patient want to make changes to medical advance directive? -  Copy of Healthcare Power of Attorney in Chart? -  Would patient like information on creating a medical advance directive? No - Patient declined  Pre-existing out of facility DNR order (yellow form or pink MOST form) -     Chief Complaint  Patient presents with  . Medical Management of Chronic Issues    Routine Visit    HPI:  Pt is a 82 y.o. female seen today [facility] for medical management of chronic diseases.She has a medical history of HTN, OA,Obesity, Anemia among other conditions. She is seen in her room today. She states right knee pain under control with current pain regimen. She continues to work with PT/OT for ROM, Exercise, gait stability and muscle strengthening.Her recent urine specimen positive for UTI. She was started on oral antibiotics.    Past Medical History:  Diagnosis Date  . Glaucoma   . Hypercholesteremia   . Hypertension   . Hypokalemia   . Status post total right knee replacement    Past Surgical History:  Procedure Laterality Date  . ABDOMINAL HYSTERECTOMY    . CATARACT EXTRACTION    . COLONOSCOPY N/A 12/10/2013   Procedure:  COLONOSCOPY;  Surgeon: West Bali, MD;  Location: [facility];  Service: Endoscopy;  Laterality: N/A;  8:30 AM  . TOTAL KNEE ARTHROPLASTY Right 03/30/2016   Procedure: TOTAL KNEE ARTHROPLASTY;  Surgeon: Vickki Hearing, MD;  Location: [facility] ORS;  Service: Orthopedics;  Laterality: Right;    No Known Allergies  Allergies as of 11/07/2016   No Known Allergies     Medication List       Accurate as of 11/07/16  5:43 PM. Always use your most recent med list.          acetaminophen 325 MG tablet Commonly known as:  TYLENOL Take 650 mg by mouth 3 (three) times daily.   amLODipine 5 MG tablet Commonly known as:  NORVASC Take 1 tablet by mouth Daily.   BIOFREEZE 4 % Gel Generic drug:  Menthol (Topical Analgesic) Apply 1 application topically daily. To be used after therapy   calcium citrate 950 MG tablet Commonly known as:  CALCITRATE - dosed in mg elemental calcium Take 200 mg of elemental calcium by mouth daily.   ciprofloxacin 500 MG tablet Commonly known as:  CIPRO Take 500 mg by mouth 2 (two) times daily.   DECUBI-VITE Caps Take 1 capsule by mouth daily.   feeding supplement (PRO-STAT SUGAR FREE 64) Liqd Take 30 mLs by mouth 2 (two) times daily.   iron polysaccharides 150 MG capsule Commonly known as:  NIFEREX Take 150 mg by mouth daily.   latanoprost 0.005 % ophthalmic solution  Commonly known as:  XALATAN Place 1 drop into both eyes at bedtime.   polyethylene glycol packet Commonly known as:  MIRALAX / GLYCOLAX Take 17 g by mouth daily as needed for mild constipation or moderate constipation.   Potassium Chloride ER 20 MEQ Tbcr Take 20 mEq by mouth daily at 6 (six) AM.   promethazine 25 MG tablet Commonly known as:  PHENERGAN Take 25 mg by mouth every 6 (six) hours as needed for nausea or vomiting.   saccharomyces boulardii 250 MG capsule Commonly known as:  FLORASTOR Take 250 mg by mouth 2 (two) times daily.   senna-docusate 8.6-50 MG  tablet Commonly known as:  Senokot-S Take 1 tablet by mouth 2 (two) times daily.   traMADol 50 MG tablet Commonly known as:  ULTRAM Take 50 mg by mouth 4 (four) times daily.   vitamin C 500 MG tablet Commonly known as:  ASCORBIC ACID Take 500 mg by mouth 2 (two) times daily.   Vitamin D (Cholecalciferol) 1000 units Tabs Give 1 tablet by mouth daily       Review of Systems  Constitutional: Negative for activity change, appetite change, chills, fatigue and fever.  HENT: Negative for congestion, sinus pain, sinus pressure and sore throat.   Eyes: Negative.   Respiratory: Negative for cough, chest tightness, shortness of breath and wheezing.   Cardiovascular: Negative for chest pain, palpitations and leg swelling.  Gastrointestinal: Negative for abdominal distention, abdominal pain, constipation, diarrhea, nausea and vomiting.  Endocrine: Negative.   Genitourinary: Negative for dysuria, flank pain and urgency.  Musculoskeletal: Positive for gait problem.       Right knee pain under control  Skin: Negative for color change, pallor and rash.  Neurological: Negative for dizziness, seizures, weakness and headaches.  Hematological: Does not bruise/bleed easily.  Psychiatric/Behavioral: Negative for agitation, confusion, hallucinations and sleep disturbance. The patient is not nervous/anxious.     Immunization History  Administered Date(s) Administered  . PPD Test 10/03/2016   Pertinent  Health Maintenance Due  Topic Date Due  . DEXA SCAN  09/18/2001  . PNA vac Low Risk Adult (1 of 2 - PCV13) 09/18/2001  . INFLUENZA VACCINE  02/21/2017   No flowsheet data found. Functional Status Survey:    Vitals:   11/07/16 1015  BP: 121/71  Pulse: 73  Resp: 18  Temp: 97.4 F (36.3 C)  TempSrc: Oral  SpO2: 97%  Weight: 188 lb 9.6 oz (85.5 kg)  Height:  (1.651 m)   Body mass index is 31.38 kg/m. Physical Exam  Constitutional: She is oriented to person, place, and time.   Obese elderly in no acute distress   HENT:  Head: Normocephalic.  Mouth/Throat: Oropharynx is clear and moist. No oropharyngeal exudate.  Eyes: Conjunctivae and EOM are normal. Pupils are equal, round, and reactive to light. Right eye exhibits no discharge. Left eye exhibits no discharge. No scleral icterus.  Neck: Normal range of motion. No JVD present. No thyromegaly present.  Cardiovascular: Normal rate, regular rhythm, normal heart sounds and intact distal pulses.  Exam reveals no gallop and no friction rub.   No murmur heard. Pulmonary/Chest: Effort normal and breath sounds normal. No respiratory distress. She has no wheezes. She has no rales. She exhibits no tenderness.  Abdominal: Soft. Bowel sounds are normal. She exhibits no distension. There is no tenderness. There is no rebound and no guarding.  Genitourinary:  Genitourinary Comments: Foley catheter draining adequate amounts of amber dark urine  Musculoskeletal: She exhibits  no edema, tenderness or deformity.  Unsteady gait. Right knee limited ROM due to pain.   Lymphadenopathy:    She has no cervical adenopathy.  Neurological: She is oriented to person, place, and time.  Skin: Skin is warm and dry. No rash noted. No erythema. No pallor.  Right knee surgical incision healed site without any redness or drainage.   Psychiatric: She has a normal mood and affect.    Labs reviewed:  Recent Labs  04/13/16 0720  04/20/16 0740  09/30/16 1348 10/01/16 0556 10/02/16 0441 10/09/16  NA 133*  < > 135  < > 137 138 138 136*  K 3.1*  < > 4.2  < > 3.1* 2.5* 4.0 4.6  CL 95*  < > 101  < > 96* 103 110  --   CO2 30  < > 29  < > --   GLUCOSE 121*  < > 116*  < > 126* 89 109*  --   BUN 12  < > 8  < > 34* 24* 12 8  CREATININE 0.55  < > 0.43*  < > 1.10* 0.77 0.55 0.5  CALCIUM 9.1  < > 8.5*  < > 9.3 8.0* 7.8*  --   MG 1.5*  --  1.9  --   --  1.8  --   --   < > = values in this interval not displayed.  Recent Labs   04/17/16 0830 09/30/16 1348 10/01/16 0556  AST 15 80* 83*  ALT 15 35 33  ALKPHOS 122 101 72  BILITOT 0.4 0.7 0.8  PROT 6.1* 7.4 5.5*  ALBUMIN 2.7* 3.3* 2.4*    Recent Labs  05/05/16 1819 05/30/16 0700 09/30/16 1348 10/01/16 0556 10/02/16 0441 10/09/16  WBC 9.2 8.0 9.5 10.2 6.9 6.6  NEUTROABS 5.8 4.8 7.6  --   --   --   HGB 10.2* 11.6* 12.5 10.0* 9.3* 10.8*  HCT 32.5* 37.0 37.7 30.3* 28.5* 34*  MCV 82.1 81.0 75.6* 75.9* 76.4*  --   PLT 433* 464* 451* 432* 428* 440*   No results found for: TSH Lab Results  Component Value Date   HGBA1C 6.0 (H) 09/30/2016   Assessment/Plan 1. Unsteady gait Status post right knee Arthroplasty. Continue to work with PT/OT for ROM, Exercise, gait stability and muscle strengthening. Fall and safety precautions.   2. Essential hypertension B/p stable.continue on Amlodipine. Continue to monitor BMP.    3. Obesity (BMI 30.0-34.9) Continue on heart healthy diet. Check TSH level, Hgb A1C and fasting lipid panel 11/08/2016.  Family/ staff Communication: Reviewed plan of care with patient and facility Nurse supervisor   Labs/tests ordered: TSH level, Hgb A1C, fasting Lipid panel 11/08/2016   Caesar Bookman, NP

## 2016-11-08 LAB — HEMOGLOBIN A1C: Hemoglobin A1C: 6

## 2016-11-08 LAB — LIPID PANEL
Cholesterol: 184 mg/dL (ref 0–200)
HDL: 59 mg/dL (ref 35–70)
LDL CALC: 90 mg/dL
Triglycerides: 175 mg/dL — AB (ref 40–160)

## 2016-11-08 LAB — TSH: TSH: 1.25 u[IU]/mL (ref 0.41–5.90)

## 2016-11-09 NOTE — Telephone Encounter (Signed)
None

## 2016-11-15 ENCOUNTER — Non-Acute Institutional Stay (SKILLED_NURSING_FACILITY): Payer: Medicare HMO | Admitting: Family

## 2016-11-15 DIAGNOSIS — M1711 Unilateral primary osteoarthritis, right knee: Secondary | ICD-10-CM | POA: Diagnosis not present

## 2016-11-15 DIAGNOSIS — R2681 Unsteadiness on feet: Secondary | ICD-10-CM | POA: Diagnosis not present

## 2016-11-15 DIAGNOSIS — I10 Essential (primary) hypertension: Secondary | ICD-10-CM

## 2016-11-15 DIAGNOSIS — D509 Iron deficiency anemia, unspecified: Secondary | ICD-10-CM

## 2016-11-15 DIAGNOSIS — E876 Hypokalemia: Secondary | ICD-10-CM

## 2016-11-15 DIAGNOSIS — R531 Weakness: Secondary | ICD-10-CM | POA: Diagnosis not present

## 2016-11-15 DIAGNOSIS — K5901 Slow transit constipation: Secondary | ICD-10-CM

## 2016-11-15 NOTE — Progress Notes (Signed)
Location:  [facility] Room Number: 103 Place of Service:  SNF [phone])  Provider: Richarda Blade FNP-C   PCP: Milana Obey, MD Patient Care Team: [name], MD as PCP - General [facility] Medicine)  Extended Emergency Contact Information Primary Emergency Contact: [name], [place] [address] [place] of [place] Home Phone: [phone] Relation: Relative Secondary Emergency Contact: [name], [place] [address] [place] of [place] Home Phone: [phone] Relation: Relative  Code Status: Full code  Goals of care:  Advanced Directive information Advanced Directives 11/15/2016  Does Patient Have a Medical Advance Directive? No  Type of Advance Directive -  Does patient want to make changes to medical advance directive? -  Copy of Healthcare Power of Attorney in Chart? -  Would patient like information on creating a medical advance directive? -  Pre-existing out of facility DNR order (yellow form or pink MOST form) -     No Known Allergies  Chief Complaint  Patient presents with  . Discharge Note    Resident is discharging from SNF.     HPI:  82 y.o. female seen today at [facility] for discharge home.she was here for short term rehabilitation for post hospital admission from 09/30/2016-10/03/2016 post fall with rhabdomylosis, hypothermia and acute renal failure. She received IV fluids and her lytes were repleted. She was treated for pressure ulcer.she has a medical history of HTN, hyperlipidemia among other conditions.she is seen in her room today. She has worked with PT/OT but continues to require max assistance with transfer due to right knee pain and unsteady gait. Recommend further skilled Nursing facility or ALF but patient insist on going home. She states has assistance at home though lives by herself at home.She is a high risk for fall and hospital readmission.right ischium pressure  ulcer managed by wound care Nurse during her stay. Her pain managed by PMR.   She will be discharged home with Home health PT/OT to continue with ROM, Exercise, Gait stability and muscle strengthening.She will also need a HH RN for right ischium stage 3 ulcer management and Foley Catheter.She will also require a HH Aid to assist with ADL's.She does not require any DME.Home health services will be arranged by facility social worker prior to discharge.she will be discharged with her medications from the facility.Prescription medication will be written x 1 month then patient to follow up with PCP in 1-2 weeks. Facility staff report no new concerns.   Past Medical History:  Diagnosis Date  . Glaucoma   . Hypercholesteremia   . Hypertension   . Hypokalemia   . Status post total right knee replacement     Past Surgical History:  Procedure Laterality Date  . ABDOMINAL HYSTERECTOMY    . CATARACT EXTRACTION    . COLONOSCOPY N/A 12/10/2013   Procedure: COLONOSCOPY;  Surgeon: West Bali, MD;  Location: [facility];  Service: Endoscopy;  Laterality: N/A;  8:30 AM  . TOTAL KNEE ARTHROPLASTY Right 03/30/2016   Procedure: TOTAL KNEE ARTHROPLASTY;  Surgeon: Vickki Hearing, MD;  Location: [facility] ORS;  Service: Orthopedics;  Laterality: Right;      reports that she has been smoking Cigarettes.  She has a 0.75 pack-year smoking history. She has never used smokeless tobacco. She reports that she does not drink alcohol or use drugs. Social History   Social History  . Marital status: Divorced  Spouse name: N/A  . Number of children: N/A  . Years of education: N/A   Occupational History  . Not on file.   Social History Main Topics  . Smoking status: Current Some Day Smoker    Packs/day: 0.25    Years: 3.00    Types: Cigarettes  . Smokeless tobacco: Never Used  . Alcohol use No  . Drug use: No  . Sexual activity: Not Currently    Birth control/ protection: Post-menopausal   Other Topics  Concern  . Not on file   Social History Narrative  . No narrative on file   No Known Allergies  Pertinent  Health Maintenance Due  Topic Date Due  . DEXA SCAN  09/18/2001  . PNA vac Low Risk Adult (1 of 2 - PCV13) 09/18/2001  . INFLUENZA VACCINE  02/21/2017    Medications: Allergies as of 11/15/2016   No Known Allergies     Medication List       Accurate as of 11/15/16 11:30 PM. Always use your most recent med list.          acetaminophen 325 MG tablet Commonly known as:  TYLENOL Take 650 mg by mouth 3 (three) times daily.   amLODipine 5 MG tablet Commonly known as:  NORVASC Take 1 tablet by mouth Daily.   BIOFREEZE 4 % Gel Generic drug:  Menthol (Topical Analgesic) Apply to right knee every 8 hours.   calcium citrate 950 MG tablet Commonly known as:  CALCITRATE - dosed in mg elemental calcium Take 200 mg of elemental calcium by mouth daily.   DECUBI-VITE Caps Take 1 capsule by mouth daily.   feeding supplement (PRO-STAT SUGAR FREE 64) Liqd Take 30 mLs by mouth 2 (two) times daily.   iron polysaccharides 150 MG capsule Commonly known as:  NIFEREX Take 150 mg by mouth daily.   latanoprost 0.005 % ophthalmic solution Commonly known as:  XALATAN Place 1 drop into both eyes at bedtime.   polyethylene glycol packet Commonly known as:  MIRALAX / GLYCOLAX Take 17 g by mouth daily as needed for mild constipation or moderate constipation.   Potassium Chloride ER 20 MEQ Tbcr Take 20 mEq by mouth daily at 6 (six) AM.   promethazine 25 MG tablet Commonly known as:  PHENERGAN Take 25 mg by mouth every 6 (six) hours as needed for nausea or vomiting.   saccharomyces boulardii 250 MG capsule Commonly known as:  FLORASTOR Take 250 mg by mouth 2 (two) times daily.   senna-docusate 8.6-50 MG tablet Commonly known as:  Senokot-S Take 1 tablet by mouth 2 (two) times daily.   traMADol 50 MG tablet Commonly known as:  ULTRAM Take 50 mg by mouth 4 (four) times  daily.   vitamin C 500 MG tablet Commonly known as:  ASCORBIC ACID Take 500 mg by mouth 2 (two) times daily.   Vitamin D (Cholecalciferol) 1000 units Tabs Give 1 tablet by mouth daily       Review of Systems  Constitutional: Negative for activity change, appetite change, chills, fatigue and fever.  HENT: Negative for congestion, sinus pain, sinus pressure and sore throat.   Eyes: Negative.   Respiratory: Negative for cough, chest tightness, shortness of breath and wheezing.   Cardiovascular: Negative for chest pain, palpitations and leg swelling.  Gastrointestinal: Negative for abdominal distention, abdominal pain, constipation, diarrhea, nausea and vomiting.  Endocrine: Negative.   Genitourinary: Negative for dysuria, flank pain and urgency.  Musculoskeletal: Positive for gait problem.  Right knee pain under control  Skin: Negative for color change, pallor and rash.       Right ischium ulcer managed by facility wound care Nurse   Neurological: Negative for dizziness, seizures, weakness and headaches.  Hematological: Does not bruise/bleed easily.  Psychiatric/Behavioral: Negative for agitation, confusion, hallucinations and sleep disturbance. The patient is not nervous/anxious.     Vitals:   11/15/16 0840  BP: 117/61  Pulse: 94  Resp: 18  Temp: 97.8 F (36.6 C)  SpO2: 94%  Weight: 168 lb (76.2 kg)  Height:  (1.651 m)   Body mass index is 27.96 kg/m. Physical Exam  Constitutional: She is oriented to person, place, and time.  Obese elderly in no acute distress   HENT:  Head: Normocephalic.  Mouth/Throat: Oropharynx is clear and moist. No oropharyngeal exudate.  Eyes: Conjunctivae and EOM are normal. Pupils are equal, round, and reactive to light. Right eye exhibits no discharge. Left eye exhibits no discharge. No scleral icterus.  Neck: Normal range of motion. No JVD present. No thyromegaly present.  Cardiovascular: Normal rate, regular rhythm, normal heart  sounds and intact distal pulses.  Exam reveals no gallop and no friction rub.   No murmur heard. Pulmonary/Chest: Effort normal and breath sounds normal. No respiratory distress. She has no wheezes. She has no rales. She exhibits no tenderness.  Abdominal: Soft. Bowel sounds are normal. She exhibits no distension. There is no tenderness. There is no rebound and no guarding.  Genitourinary:  Genitourinary Comments: Foley catheter draining adequate amounts of amber colored urine  Musculoskeletal: She exhibits no edema, tenderness or deformity.  Unsteady gait. Right knee limited ROM due to pain.   Lymphadenopathy:    She has no cervical adenopathy.  Neurological: She is oriented to person, place, and time.  Skin: Skin is warm and dry. No rash noted. No erythema. No pallor.  Right ischium stage 3 ulcer; wound bed red fragile hypergranulation. Old drsg with serosanguinous drainage. Surrounding skin tissue without any signs of infections.   Psychiatric: She has a normal mood and affect.    Labs reviewed: Basic Metabolic Panel:  Recent Labs  11/91/47 0720  04/20/16 0740  09/30/16 1348 10/01/16 0556 10/02/16 0441 10/09/16  NA 133*  < > 135  < > 137 138 138 136*  K 3.1*  < > 4.2  < > 3.1* 2.5* 4.0 4.6  CL 95*  < > 101  < > 96* 103 110  --   CO2 30  < > 29  < > --   GLUCOSE 121*  < > 116*  < > 126* 89 109*  --   BUN 12  < > 8  < > 34* 24* 12 8  CREATININE 0.55  < > 0.43*  < > 1.10* 0.77 0.55 0.5  CALCIUM 9.1  < > 8.5*  < > 9.3 8.0* 7.8*  --   MG 1.5*  --  1.9  --   --  1.8  --   --   < > = values in this interval not displayed. Liver Function Tests:  Recent Labs  04/17/16 0830 09/30/16 1348 10/01/16 0556  AST 15 80* 83*  ALT 15 35 33  ALKPHOS 122 101 72  BILITOT 0.4 0.7 0.8  PROT 6.1* 7.4 5.5*  ALBUMIN 2.7* 3.3* 2.4*   CBC:  Recent Labs  05/05/16 1819 05/30/16 0700 09/30/16 1348 10/01/16 0556 10/02/16 0441 10/09/16  WBC 9.2 8.0 9.5 10.2 6.9 6.6  NEUTROABS  5.8 4.8 7.6  --   --   --   HGB 10.2* 11.6* 12.5 10.0* 9.3* 10.8*  HCT 32.5* 37.0 37.7 30.3* 28.5* 34*  MCV 82.1 81.0 75.6* 75.9* 76.4*  --   PLT 433* 464* 451* 432* 428* 440*   Cardiac Enzymes:  Recent Labs  10/01/16 0556 10/02/16 0441 10/03/16 0430  CKTOTAL 2,035* 811* 329*   Assessment/Plan:   1. Unsteady gait  Has worked  with PT/ OT.continues to be high risk for fall. Requires max assistance with transfer but insist on being discharged home. Will discharge home PT/OT to continue with ROM, Exercise, Gait stability and muscle strengthening.No DME required. Fall and safety precautions.   2. Generalized weakness Status post short term rehabilitation for post hospital admission from 09/30/2016-10/03/2016 post fall with rhabdomyolysis. Has worked with PT/OT.Will discharge home against medical advice.continues to require max assistance. High risk for hospital readmission.Will discharge home with HH: PT/OT to continue with muscle strengthening.    3. Essential hypertension B/P stable.continue on amlodipine 5 mg Tablet. BMP in 1-2 weeks with PCP.    4. Primary osteoarthritis of right knee Continue on Tramadol 50 mg Tablet four times daily. Continue to wean off as tolerated.    5. Slow transit constipation Current regimen effective.   6. Iron deficiency anemia Hgb stable 10.8 continue on iron 150 mg capsule daily. CBC in 1-2 weeks with PCP.   7. Hypokalemia Continue on potassium chloride. BMP in 1-2 weeks with PCP.   Patient is being discharged with the following home health services:   -PT/OT for ROM, exercise, gait stability and muscle strengthening  -  HH RN for wound care and Foley Catheter management   - HH Aid for ADL's assist   Patient is being discharged with the following durable medical equipment:   - None required.  Patient has been advised to f/u with their PCP in 1-2 weeks to for a transitions of care visit.Social services at their facility was responsible for  arranging this appointment.  Pt was provided with adequate prescriptions of noncontrolled medications to reach the scheduled appointment.For controlled substances, a limited supply was provided as appropriate for the individual patient. If the pt normally receives these medications from a pain clinic or has a contract with another physician, these medications should be received from that clinic or physician only).    Future labs/tests needed:  CBC, BMP in 1-2 weeks PCP

## 2016-11-15 NOTE — Progress Notes (Deleted)
Location:  [facility] Room Number: 103 Place of Service:  SNF [phone])  Provider: Richarda Blade, NP  PCP: Milana Obey, MD Patient Care Team: [name], MD as PCP - General [facility] Medicine)  Extended Emergency Contact Information Primary Emergency Contact: [name], [place] [address] [place] Home Phone: [phone] Relation: Relative Secondary Emergency Contact: [name], [place] [address] [place] of [place] Home Phone: [phone] Relation: Relative  Code Status: Full Code  Goals of care:  Advanced Directive information Advanced Directives 11/15/2016  Does Patient Have a Medical Advance Directive? No  Type of Advance Directive -  Does patient want to make changes to medical advance directive? -  Copy of Healthcare Power of Attorney in Chart? -  Would patient like information on creating a medical advance directive? -  Pre-existing out of facility DNR order (yellow form or pink MOST form) -     No Known Allergies  Chief Complaint  Patient presents with  . Discharge Note    Resident is discharging from SNF.     HPI:  82 y.o. female      Past Medical History:  Diagnosis Date  . Glaucoma   . Hypercholesteremia   . Hypertension   . Hypokalemia   . Status post total right knee replacement     Past Surgical History:  Procedure Laterality Date  . ABDOMINAL HYSTERECTOMY    . CATARACT EXTRACTION    . COLONOSCOPY N/A 12/10/2013   Procedure: COLONOSCOPY;  Surgeon: West Bali, MD;  Location: [facility];  Service: Endoscopy;  Laterality: N/A;  8:30 AM  . TOTAL KNEE ARTHROPLASTY Right 03/30/2016   Procedure: TOTAL KNEE ARTHROPLASTY;  Surgeon: Vickki Hearing, MD;  Location: [facility] ORS;  Service: Orthopedics;  Laterality: Right;      reports that she has been smoking Cigarettes.  She has a 0.75 pack-year smoking history. She has never used smokeless tobacco. She  reports that she does not drink alcohol or use drugs. Social History   Social History  . Marital status: Divorced    Spouse name: N/A  . Number of children: N/A  . Years of education: N/A   Occupational History  . Not on file.   Social History Main Topics  . Smoking status: Current Some Day Smoker    Packs/day: 0.25    Years: 3.00    Types: Cigarettes  . Smokeless tobacco: Never Used  . Alcohol use No  . Drug use: No  . Sexual activity: Not Currently    Birth control/ protection: Post-menopausal   Other Topics Concern  . Not on file   Social History Narrative  . No narrative on file   Functional Status Survey:    No Known Allergies  Pertinent  Health Maintenance Due  Topic Date Due  . DEXA SCAN  09/18/2001  . PNA vac Low Risk Adult (1 of 2 - PCV13) 09/18/2001  . INFLUENZA VACCINE  02/21/2017    Medications: Allergies as of 11/15/2016   No Known Allergies     Medication List       Accurate as of 11/15/16  8:53 AM. Always use your most recent med list.          acetaminophen 325 MG tablet Commonly known as:  TYLENOL Take 650 mg by mouth 3 (three) times daily.   amLODipine 5 MG tablet Commonly known  as:  NORVASC Take 1 tablet by mouth Daily.   BIOFREEZE 4 % Gel Generic drug:  Menthol (Topical Analgesic) Apply to right knee every 8 hours.   calcium citrate 950 MG tablet Commonly known as:  CALCITRATE - dosed in mg elemental calcium Take 200 mg of elemental calcium by mouth daily.   DECUBI-VITE Caps Take 1 capsule by mouth daily.   feeding supplement (PRO-STAT SUGAR FREE 64) Liqd Take 30 mLs by mouth 2 (two) times daily.   iron polysaccharides 150 MG capsule Commonly known as:  NIFEREX Take 150 mg by mouth daily.   latanoprost 0.005 % ophthalmic solution Commonly known as:  XALATAN Place 1 drop into both eyes at bedtime.   polyethylene glycol packet Commonly known as:  MIRALAX / GLYCOLAX Take 17 g by mouth daily as needed for mild  constipation or moderate constipation.   Potassium Chloride ER 20 MEQ Tbcr Take 20 mEq by mouth daily at 6 (six) AM.   promethazine 25 MG tablet Commonly known as:  PHENERGAN Take 25 mg by mouth every 6 (six) hours as needed for nausea or vomiting.   saccharomyces boulardii 250 MG capsule Commonly known as:  FLORASTOR Take 250 mg by mouth 2 (two) times daily.   senna-docusate 8.6-50 MG tablet Commonly known as:  Senokot-S Take 1 tablet by mouth 2 (two) times daily.   traMADol 50 MG tablet Commonly known as:  ULTRAM Take 50 mg by mouth 4 (four) times daily.   vitamin C 500 MG tablet Commonly known as:  ASCORBIC ACID Take 500 mg by mouth 2 (two) times daily.   Vitamin D (Cholecalciferol) 1000 units Tabs Give 1 tablet by mouth daily       Review of Systems  Vitals:   11/15/16 0840  BP: 117/61  Pulse: 94  Resp: 18  Temp: 97.8 F (36.6 C)  SpO2: 94%  Weight: 168 lb (76.2 kg)  Height:  (1.651 m)   Body mass index is 27.96 kg/m. Physical Exam  Labs reviewed: Basic Metabolic Panel:  Recent Labs  16/10/96 0720  04/20/16 0740  09/30/16 1348 10/01/16 0556 10/02/16 0441 10/09/16  NA 133*  < > 135  < > 137 138 138 136*  K 3.1*  < > 4.2  < > 3.1* 2.5* 4.0 4.6  CL 95*  < > 101  < > 96* 103 110  --   CO2 30  < > 29  < > --   GLUCOSE 121*  < > 116*  < > 126* 89 109*  --   BUN 12  < > 8  < > 34* 24* 12 8  CREATININE 0.55  < > 0.43*  < > 1.10* 0.77 0.55 0.5  CALCIUM 9.1  < > 8.5*  < > 9.3 8.0* 7.8*  --   MG 1.5*  --  1.9  --   --  1.8  --   --   < > = values in this interval not displayed. Liver Function Tests:  Recent Labs  04/17/16 0830 09/30/16 1348 10/01/16 0556  AST 15 80* 83*  ALT 15 35 33  ALKPHOS 122 101 72  BILITOT 0.4 0.7 0.8  PROT 6.1* 7.4 5.5*  ALBUMIN 2.7* 3.3* 2.4*   No results for input(s): LIPASE, AMYLASE in the last 8760 hours. No results for input(s): AMMONIA in the last 8760 hours. CBC:  Recent Labs  05/05/16 1819  05/30/16 0700 09/30/16 1348 10/01/16 0556 10/02/16 0441 10/09/16  WBC 9.2 8.0 9.5 10.2 6.9 6.6  NEUTROABS 5.8 4.8 7.6  --   --   --   HGB 10.2* 11.6* 12.5 10.0* 9.3* 10.8*  HCT 32.5* 37.0 37.7 30.3* 28.5* 34*  MCV 82.1 81.0 75.6* 75.9* 76.4*  --   PLT 433* 464* 451* 432* 428* 440*   Cardiac Enzymes:  Recent Labs  10/01/16 0556 10/02/16 0441 10/03/16 0430  CKTOTAL 2,035* 811* 329*   BNP: Invalid input(s): POCBNP CBG: No results for input(s): GLUCAP in the last 8760 hours.  Procedures and Imaging Studies During Stay: No results found.  Assessment/Plan:   There are no diagnoses linked to this encounter.   Patient is being discharged with the following home health services:    Patient is being discharged with the following durable medical equipment:    Patient has been advised to f/u with their PCP in 1-2 weeks to bring them up to date on their rehab stay.  Social services at facility was responsible for arranging this appointment.  Pt was provided with a 30 day supply of prescriptions for medications and refills must be obtained from their PCP.  For controlled substances, a more limited supply may be provided adequate until PCP appointment only.  Future labs/tests needed:  ***

## 2016-12-01 ENCOUNTER — Encounter (HOSPITAL_COMMUNITY): Payer: Self-pay | Admitting: Emergency Medicine

## 2016-12-01 ENCOUNTER — Observation Stay (HOSPITAL_COMMUNITY)
Admission: EM | Admit: 2016-12-01 | Discharge: 2016-12-04 | Disposition: A | Discharge status: Home / Self Care | Payer: Medicare HMO | Attending: Family Medicine | Admitting: Family Medicine

## 2016-12-01 DIAGNOSIS — R531 Weakness: Secondary | ICD-10-CM | POA: Diagnosis not present

## 2016-12-01 DIAGNOSIS — A4902 Methicillin resistant Staphylococcus aureus infection, unspecified site: Secondary | ICD-10-CM | POA: Diagnosis not present

## 2016-12-01 DIAGNOSIS — R262 Difficulty in walking, not elsewhere classified: Secondary | ICD-10-CM | POA: Diagnosis not present

## 2016-12-01 DIAGNOSIS — D509 Iron deficiency anemia, unspecified: Secondary | ICD-10-CM

## 2016-12-01 DIAGNOSIS — Z9181 History of falling: Secondary | ICD-10-CM | POA: Insufficient documentation

## 2016-12-01 DIAGNOSIS — K5901 Slow transit constipation: Secondary | ICD-10-CM | POA: Insufficient documentation

## 2016-12-01 DIAGNOSIS — Z809 Family history of malignant neoplasm, unspecified: Secondary | ICD-10-CM | POA: Insufficient documentation

## 2016-12-01 DIAGNOSIS — Z90710 Acquired absence of both cervix and uterus: Secondary | ICD-10-CM | POA: Diagnosis not present

## 2016-12-01 DIAGNOSIS — N3090 Cystitis, unspecified without hematuria: Secondary | ICD-10-CM | POA: Diagnosis not present

## 2016-12-01 DIAGNOSIS — F17210 Nicotine dependence, cigarettes, uncomplicated: Secondary | ICD-10-CM | POA: Diagnosis not present

## 2016-12-01 DIAGNOSIS — N390 Urinary tract infection, site not specified: Secondary | ICD-10-CM | POA: Diagnosis present

## 2016-12-01 DIAGNOSIS — Z79899 Other long term (current) drug therapy: Secondary | ICD-10-CM | POA: Diagnosis not present

## 2016-12-01 DIAGNOSIS — E7800 Pure hypercholesterolemia, unspecified: Secondary | ICD-10-CM | POA: Diagnosis not present

## 2016-12-01 DIAGNOSIS — Z8261 Family history of arthritis: Secondary | ICD-10-CM | POA: Diagnosis not present

## 2016-12-01 DIAGNOSIS — Z96651 Presence of right artificial knee joint: Secondary | ICD-10-CM | POA: Diagnosis not present

## 2016-12-01 DIAGNOSIS — E876 Hypokalemia: Secondary | ICD-10-CM | POA: Diagnosis not present

## 2016-12-01 DIAGNOSIS — L89310 Pressure ulcer of right buttock, unstageable: Secondary | ICD-10-CM

## 2016-12-01 DIAGNOSIS — L89302 Pressure ulcer of unspecified buttock, stage 2: Secondary | ICD-10-CM | POA: Diagnosis not present

## 2016-12-01 DIAGNOSIS — M25561 Pain in right knee: Secondary | ICD-10-CM | POA: Insufficient documentation

## 2016-12-01 DIAGNOSIS — R300 Dysuria: Secondary | ICD-10-CM | POA: Diagnosis present

## 2016-12-01 DIAGNOSIS — D649 Anemia, unspecified: Secondary | ICD-10-CM | POA: Diagnosis present

## 2016-12-01 DIAGNOSIS — L8992 Pressure ulcer of unspecified site, stage 2: Secondary | ICD-10-CM | POA: Diagnosis present

## 2016-12-01 DIAGNOSIS — I10 Essential (primary) hypertension: Secondary | ICD-10-CM | POA: Diagnosis present

## 2016-12-01 DIAGNOSIS — T83511A Infection and inflammatory reaction due to indwelling urethral catheter, initial encounter: Principal | ICD-10-CM | POA: Insufficient documentation

## 2016-12-01 LAB — URINALYSIS, ROUTINE W REFLEX MICROSCOPIC
Glucose, UA: NEGATIVE mg/dL
KETONES UR: 40 mg/dL — AB
Nitrite: POSITIVE — AB
PH: 6 (ref 5.0–8.0)
Protein, ur: >300 mg/dL — AB
SPECIFIC GRAVITY, URINE: 1.025 (ref 1.005–1.030)

## 2016-12-01 LAB — I-STAT CHEM 8, ED
BUN: 15 mg/dL (ref 6–20)
CHLORIDE: 95 mmol/L — AB (ref 101–111)
CREATININE: 0.8 mg/dL (ref 0.44–1.00)
Calcium, Ion: 1.11 mmol/L — ABNORMAL LOW (ref 1.15–1.40)
Glucose, Bld: 85 mg/dL (ref 65–99)
HEMATOCRIT: 37 % (ref 36.0–46.0)
HEMOGLOBIN: 12.6 g/dL (ref 12.0–15.0)
POTASSIUM: 3.5 mmol/L (ref 3.5–5.1)
Sodium: 135 mmol/L (ref 135–145)
TCO2: 31 mmol/L (ref 0–100)

## 2016-12-01 LAB — URINALYSIS, MICROSCOPIC (REFLEX): SQUAMOUS EPITHELIAL / LPF: NONE SEEN

## 2016-12-01 LAB — CBC WITH DIFFERENTIAL/PLATELET
BASOS ABS: 0 10*3/uL (ref 0.0–0.1)
Basophils Relative: 0 %
EOS PCT: 1 %
Eosinophils Absolute: 0 10*3/uL (ref 0.0–0.7)
HCT: 35 % — ABNORMAL LOW (ref 36.0–46.0)
HEMOGLOBIN: 11.3 g/dL — AB (ref 12.0–15.0)
LYMPHS ABS: 1.8 10*3/uL (ref 0.7–4.0)
LYMPHS PCT: 27 %
MCH: 25.4 pg — AB (ref 26.0–34.0)
MCHC: 32.3 g/dL (ref 30.0–36.0)
MCV: 78.7 fL (ref 78.0–100.0)
Monocytes Absolute: 0.8 10*3/uL (ref 0.1–1.0)
Monocytes Relative: 12 %
NEUTROS ABS: 3.9 10*3/uL (ref 1.7–7.7)
NEUTROS PCT: 60 %
PLATELETS: 595 10*3/uL — AB (ref 150–400)
RBC: 4.45 MIL/uL (ref 3.87–5.11)
RDW: 16.2 % — ABNORMAL HIGH (ref 11.5–15.5)
WBC: 6.6 10*3/uL (ref 4.0–10.5)

## 2016-12-01 LAB — COMPREHENSIVE METABOLIC PANEL
ALT: 10 U/L — AB (ref 14–54)
AST: 15 U/L (ref 15–41)
Albumin: 3.3 g/dL — ABNORMAL LOW (ref 3.5–5.0)
Alkaline Phosphatase: 87 U/L (ref 38–126)
Anion gap: 10 (ref 5–15)
BUN: 13 mg/dL (ref 6–20)
CHLORIDE: 95 mmol/L — AB (ref 101–111)
CO2: 29 mmol/L (ref 22–32)
CREATININE: 0.62 mg/dL (ref 0.44–1.00)
Calcium: 9.1 mg/dL (ref 8.9–10.3)
GFR calc Af Amer: >60 mL/min (ref >60)
Glucose, Bld: 86 mg/dL (ref 65–99)
Potassium: 3.2 mmol/L — ABNORMAL LOW (ref 3.5–5.1)
Sodium: 134 mmol/L — ABNORMAL LOW (ref 135–145)
Total Bilirubin: 0.7 mg/dL (ref 0.3–1.2)
Total Protein: 7.2 g/dL (ref 6.5–8.1)

## 2016-12-01 LAB — I-STAT CG4 LACTIC ACID, ED: Lactic Acid, Venous: 1.21 mmol/L (ref 0.5–1.9)

## 2016-12-01 MED ORDER — TRAMADOL HCL 50 MG PO TABS
50.0000 mg | ORAL_TABLET | Freq: Four times a day (QID) | ORAL | Status: DC
  Administered 2016-12-01 – 2016-12-04 (×10): 50 mg via ORAL
  Filled 2016-12-01 (×11): qty 1

## 2016-12-01 MED ORDER — DEXTROSE 5 % IV SOLN
1.0000 g | Freq: Once | INTRAVENOUS | Status: AC
  Administered 2016-12-01: 1 g via INTRAVENOUS
  Filled 2016-12-01: qty 10

## 2016-12-01 MED ORDER — FERROUS GLUCONATE 324 (38 FE) MG PO TABS
324.0000 mg | ORAL_TABLET | Freq: Every day | ORAL | Status: DC
  Administered 2016-12-02 – 2016-12-04 (×3): 324 mg via ORAL
  Filled 2016-12-01 (×5): qty 1

## 2016-12-01 MED ORDER — SODIUM CHLORIDE 0.9 % IV SOLN
INTRAVENOUS | Status: DC
  Administered 2016-12-01 – 2016-12-03 (×3): via INTRAVENOUS

## 2016-12-01 MED ORDER — POLYSACCHARIDE IRON COMPLEX 150 MG PO CAPS
150.0000 mg | ORAL_CAPSULE | Freq: Every day | ORAL | Status: DC
  Administered 2016-12-01 – 2016-12-04 (×4): 150 mg via ORAL
  Filled 2016-12-01 (×4): qty 1

## 2016-12-01 MED ORDER — PRO-STAT SUGAR FREE PO LIQD
30.0000 mL | Freq: Two times a day (BID) | ORAL | Status: DC
  Administered 2016-12-01 – 2016-12-04 (×6): 30 mL via ORAL
  Filled 2016-12-01 (×6): qty 30

## 2016-12-01 MED ORDER — ENSURE ENLIVE PO LIQD
237.0000 mL | Freq: Two times a day (BID) | ORAL | Status: DC
  Administered 2016-12-02: 237 mL via ORAL

## 2016-12-01 MED ORDER — DEXTROSE 5 % IV SOLN
1.0000 g | INTRAVENOUS | Status: DC
  Administered 2016-12-02 – 2016-12-03 (×2): 1 g via INTRAVENOUS
  Filled 2016-12-01 (×3): qty 10

## 2016-12-01 MED ORDER — VITAMIN C 500 MG PO TABS
500.0000 mg | ORAL_TABLET | Freq: Two times a day (BID) | ORAL | Status: DC
  Administered 2016-12-01 – 2016-12-04 (×6): 500 mg via ORAL
  Filled 2016-12-01 (×6): qty 1

## 2016-12-01 MED ORDER — SODIUM CHLORIDE 0.9 % IV SOLN: 250.0000 mL | INTRAVENOUS | Status: DC | PRN

## 2016-12-01 MED ORDER — SODIUM CHLORIDE 0.9 % IV BOLUS (SEPSIS)
2000.0000 mL | Freq: Once | INTRAVENOUS | Status: AC
  Administered 2016-12-01: 2000 mL via INTRAVENOUS

## 2016-12-01 MED ORDER — ONDANSETRON HCL 4 MG/2ML IJ SOLN
4.0000 mg | Freq: Four times a day (QID) | INTRAMUSCULAR | Status: DC | PRN
  Administered 2016-12-02: 4 mg via INTRAVENOUS
  Filled 2016-12-01: qty 2

## 2016-12-01 MED ORDER — POTASSIUM CHLORIDE CRYS ER 10 MEQ PO TBCR
20.0000 meq | EXTENDED_RELEASE_TABLET | Freq: Every day | ORAL | Status: DC
  Administered 2016-12-02 – 2016-12-04 (×3): 20 meq via ORAL
  Filled 2016-12-01 (×4): qty 2

## 2016-12-01 MED ORDER — ENOXAPARIN SODIUM 40 MG/0.4ML ~~LOC~~ SOLN
40.0000 mg | SUBCUTANEOUS | Status: DC
  Administered 2016-12-01 – 2016-12-03 (×3): 40 mg via SUBCUTANEOUS
  Filled 2016-12-01 (×3): qty 0.4

## 2016-12-01 MED ORDER — AMLODIPINE BESYLATE 5 MG PO TABS
5.0000 mg | ORAL_TABLET | Freq: Every day | ORAL | Status: DC
  Administered 2016-12-01 – 2016-12-04 (×4): 5 mg via ORAL
  Filled 2016-12-01 (×4): qty 1

## 2016-12-01 MED ORDER — SODIUM CHLORIDE 0.9% FLUSH
3.0000 mL | Freq: Two times a day (BID) | INTRAVENOUS | Status: DC
  Administered 2016-12-01 – 2016-12-04 (×3): 3 mL via INTRAVENOUS

## 2016-12-01 MED ORDER — SODIUM CHLORIDE 0.9% FLUSH: 3.0000 mL | INTRAVENOUS | Status: DC | PRN

## 2016-12-01 MED ORDER — ONDANSETRON HCL 4 MG PO TABS: 4.0000 mg | ORAL_TABLET | Freq: Four times a day (QID) | ORAL | Status: DC | PRN

## 2016-12-01 MED ORDER — LATANOPROST 0.005 % OP SOLN
1.0000 [drp] | Freq: Every day | OPHTHALMIC | Status: DC
  Administered 2016-12-01 – 2016-12-03 (×3): 1 [drp] via OPHTHALMIC
  Filled 2016-12-01: qty 2.5

## 2016-12-01 MED ORDER — ACETAMINOPHEN 325 MG PO TABS
650.0000 mg | ORAL_TABLET | Freq: Three times a day (TID) | ORAL | Status: DC
  Administered 2016-12-01 – 2016-12-04 (×9): 650 mg via ORAL
  Filled 2016-12-01 (×9): qty 2

## 2016-12-01 MED ORDER — LATANOPROST 0.005 % OP SOLN
OPHTHALMIC | Status: AC
  Filled 2016-12-01: qty 2.5

## 2016-12-01 MED ORDER — POLYETHYLENE GLYCOL 3350 17 G PO PACK: 17.0000 g | PACK | Freq: Every day | ORAL | Status: DC | PRN

## 2016-12-01 NOTE — ED Notes (Signed)
IV infiltrated in left arm. IV removed. Attempting to reinsert via [place]

## 2016-12-01 NOTE — ED Provider Notes (Signed)
[facility] Provider Note   CSN: [id number] Arrival date & time: 12/01/16  1502     History   Chief Complaint Chief Complaint  Patient presents with  . Dysuria    HPI Anna Hanna is a 82 y.o. female.  Patient states that she has been very weak the last couple days and has not Had a bed. She has a history of knee replacement is does not walk well and any she also complains of dysuria   The history is provided by the patient. No language interpreter was used.  Dysuria   This is a new problem. The current episode started more than 2 days ago. The problem occurs every urination. The problem has been resolved. The quality of the pain is described as burning. The pain is at a severity of 3/10. The pain is moderate. There has been no fever. She is not sexually active. Pertinent negatives include no chills, no frequency and no hematuria. She has tried nothing for the symptoms.    Past Medical History:  Diagnosis Date  . Glaucoma   . Hypercholesteremia   . Hypertension   . Hypokalemia   . Status post total right knee replacement     Patient Active Problem List   Diagnosis Date Noted  . Rhabdomyolysis 09/30/2016  . Immobility 09/30/2016  . Hypokalemia 09/30/2016  . Pressure ulcer, stage 2 09/30/2016  . Acute renal failure (HCC) 09/30/2016  . Hypothermia 09/30/2016  . Hypertension 04/11/2016  . 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 Anemia 04/02/2016  . Hyponatremia 04/02/2016  . Hyperglycemia 04/02/2016  . Femur fracture, right (HCC) 04/02/2016  . Osteoarthritis of right knee 03/30/2016  . Arthritis of knee, right     Past Surgical History:  Procedure Laterality Date  . ABDOMINAL HYSTERECTOMY    . CATARACT EXTRACTION    . COLONOSCOPY N/A 12/10/2013   Procedure: COLONOSCOPY;  Surgeon: West Bali, MD;  Location: [facility];  Service: Endoscopy;  Laterality: N/A;  8:30 AM  . TOTAL  KNEE ARTHROPLASTY Right 03/30/2016   Procedure: TOTAL KNEE ARTHROPLASTY;  Surgeon: Vickki Hearing, MD;  Location: [facility] ORS;  Service: Orthopedics;  Laterality: Right;    OB History    No data available       Home Medications    Prior to Admission medications   Medication Sig Start Date End Date Taking? Authorizing Provider  acetaminophen (TYLENOL) 325 MG tablet Take 650 mg by mouth 3 (three) times daily.    Yes [provider]  Amino Acids-Protein Hydrolys (FEEDING SUPPLEMENT, PRO-STAT SUGAR FREE 64,) LIQD Take 30 mLs by mouth 2 (two) times daily.   Yes [provider]  amLODipine (NORVASC) 5 MG tablet Take 1 tablet by mouth Daily. 05/24/12  Yes [provider]  calcium citrate (CALCITRATE - DOSED IN MG ELEMENTAL CALCIUM) 950 MG tablet Take 200 mg of elemental calcium by mouth daily.   Yes [provider]  iron polysaccharides (NIFEREX) 150 MG capsule Take 150 mg by mouth daily.   Yes [provider]  latanoprost (XALATAN) 0.005 % ophthalmic solution Place 1 drop into both eyes at bedtime.   Yes [provider]  Menthol, Topical Analgesic, (BIOFREEZE) 4 % GEL Apply to right knee every 8 hours.   Yes [provider]  Multiple Vitamins-Minerals (DECUBI-VITE) CAPS Take 1 capsule by mouth daily.   Yes [provider]  polyethylene glycol (MIRALAX / GLYCOLAX) packet Take  17 g by mouth daily as needed for mild constipation or moderate constipation.    Yes [provider]  Potassium Chloride ER 20 MEQ TBCR Take 20 mEq by mouth daily at 6 (six) AM.   Yes [provider]  promethazine (PHENERGAN) 25 MG tablet Take 25 mg by mouth every 6 (six) hours as needed for nausea or vomiting.   Yes [provider]  senna-docusate (SENOKOT-S) 8.6-50 MG tablet Take 1 tablet by mouth 2 (two) times daily.   Yes [provider]  traMADol (ULTRAM) 50 MG tablet Take 50 mg by mouth 4 (four) times daily.   Yes  [provider]  vitamin C (ASCORBIC ACID) 500 MG tablet Take 500 mg by mouth 2 (two) times daily.    Yes [provider]  Vitamin D, Cholecalciferol, 1000 units TABS Give 1 tablet by mouth daily   Yes [provider]    Family History Family History  Problem Relation Age of Onset  . Cancer Mother   . Arthritis Mother   . Hypotension Father   . Cancer Sister   . Hypotension Sister   . Colon cancer Neg Hx     Social History Social History  Substance Use Topics  . Smoking status: Current Some Day Smoker    Packs/day: 0.25    Years: 3.00    Types: Cigarettes  . Smokeless tobacco: Never Used  . Alcohol use No     Allergies   Patient has no known allergies.   Review of Systems Review of Systems  Constitutional: Negative for appetite change, chills and fatigue.  HENT: Negative for congestion, ear discharge and sinus pressure.   Eyes: Negative for discharge.  Respiratory: Negative for cough.   Cardiovascular: Negative for chest pain.  Gastrointestinal: Negative for abdominal pain and diarrhea.  Genitourinary: Positive for dysuria. Negative for frequency and hematuria.  Musculoskeletal: Negative for back pain.  Skin: Negative for rash.  Neurological: Negative for seizures and headaches.  Psychiatric/Behavioral: Negative for hallucinations.     Physical Exam Updated Vital Signs BP 114/82   Pulse 73   Temp 98.2 F (36.8 C) (Oral)   Resp 19   Ht 5\' 5"  (1.651 m)   Wt 168 lb (76.2 kg)   SpO2 98%   BMI 27.96 kg/m   Physical Exam  Constitutional: She is oriented to person, place, and time. She appears well-developed.  HENT:  Head: Normocephalic.  Eyes: Conjunctivae and EOM are normal. No scleral icterus.  Neck: Neck supple. No thyromegaly present.  Cardiovascular: Normal rate and regular rhythm.  Exam reveals no gallop and no friction rub.   No murmur heard. Pulmonary/Chest: No stridor. She has no wheezes. She has no rales. She exhibits  no tenderness.  Abdominal: She exhibits no distension. There is no tenderness. There is no rebound.  Musculoskeletal: Normal range of motion. She exhibits no edema.  Swelling tender right knee.  Lymphadenopathy:    She has no cervical adenopathy.  Neurological: She is oriented to person, place, and time. She exhibits normal muscle tone. Coordination normal.  Skin: No rash noted. No erythema.  Psychiatric: She has a normal mood and affect. Her behavior is normal.     ED Treatments / Results  Labs (all labs ordered are listed, but only abnormal results are displayed) Labs Reviewed  CBC WITH DIFFERENTIAL/PLATELET - Abnormal; Notable for the following:       Result Value   Hemoglobin 11.3 (*)    HCT 35.0 (*)  MCH 25.4 (*)    RDW 16.2 (*)    Platelets 595 (*)    All other components within normal limits  COMPREHENSIVE METABOLIC PANEL - Abnormal; Notable for the following:    Sodium 134 (*)    Potassium 3.2 (*)    Chloride 95 (*)    Albumin 3.3 (*)    ALT 10 (*)    All other components within normal limits  URINALYSIS, ROUTINE W REFLEX MICROSCOPIC - Abnormal; Notable for the following:    APPearance TURBID (*)    Hgb urine dipstick LARGE (*)    Bilirubin Urine SMALL (*)    Ketones, ur 40 (*)    Protein, ur >300 (*)    Nitrite POSITIVE (*)    Leukocytes, UA MODERATE (*)    All other components within normal limits  URINALYSIS, MICROSCOPIC (REFLEX) - Abnormal; Notable for the following:    Bacteria, UA MANY (*)    All other components within normal limits  I-STAT CHEM 8, ED - Abnormal; Notable for the following:    Chloride 95 (*)    Calcium, Ion 1.11 (*)    All other components within normal limits  URINE CULTURE  I-STAT CG4 LACTIC ACID, ED  I-STAT CG4 LACTIC ACID, ED    EKG  EKG Interpretation None       Radiology No results found.  Procedures Procedures (including critical care time)  Medications Ordered in ED Medications  cefTRIAXone (ROCEPHIN) 1 g in  dextrose 5 % 50 mL IVPB (not administered)  sodium chloride 0.9 % bolus 2,000 mL (0 mLs Intravenous Stopped 12/01/16 1858)     Initial Impression / Assessment and Plan / ED Course  I have reviewed the triage vital signs and the nursing notes.  Pertinent labs & imaging results that were available during my care of the patient were reviewed by me and considered in my medical decision making (see chart for details).     Pt with uti.  Admit to obs for tx Final Clinical Impressions(s) / ED Diagnoses   Final diagnoses:  Cystitis    New Prescriptions New Prescriptions   No medications on file     Bethann Berkshire, MD 12/01/16 1932

## 2016-12-01 NOTE — H&P (Signed)
[name] H&P    Patient Demographics:    Anna Hanna, is a 82 y.o. female  MRN: [id number]  DOB - 1934/10/27  Admit Date - 12/01/2016  Referring MD/NP/PA: Dr Estell Harpin  Outpatient Primary MD for the patient is [name], MD  Patient coming from: Home  Chief Complaint  Patient presents with  . Dysuria      HPI:    Anna Hanna  is a 82 y.o. female, With history of hypertension, status post right knee replacement came to hospital with complaints of generalized weakness. Patient recently had right knee replacement, and patient has difficulty walking despite getting physical therapy at home. She was recently discharged from rehabilitation with Foley catheter, due to stage II decubitus ulcers on the buttock. Patient has been complaining of dysuria at home, she denies fever or chills. Today home health RN visited patient's home and noted that patient had cloudy urine in the bag.  She denies chest pain, no shortness of breath. No nausea vomiting or diarrhea. She denies passing out. Has complaints of generalized weakness with difficulty  walking.   Review of systems:    In addition to the HPI above,  No Fever-chills, No Headache, No changes with Vision or hearing, No problems swallowing food or Liquids, No Chest pain, Cough or Shortness of Breath, No Abdominal pain, No Nausea or Vomiting, bowel movements are regular,+ retching  No new skin rashes or bruises, + Right knee pain  No new weakness, tingling, numbness in any extremity   A full 10 point Review of Systems was done, except as stated above, all other Review of Systems were negative.   With Past History of the following :    Past Medical History:  Diagnosis Date  . Glaucoma   . Hypercholesteremia   . Hypertension   . Hypokalemia   . Status post total right knee replacement       Past Surgical History:  Procedure Laterality Date  .  ABDOMINAL HYSTERECTOMY    . CATARACT EXTRACTION    . COLONOSCOPY N/A 12/10/2013   Procedure: COLONOSCOPY;  Surgeon: West Bali, MD;  Location: [facility];  Service: Endoscopy;  Laterality: N/A;  8:30 AM  . TOTAL KNEE ARTHROPLASTY Right 03/30/2016   Procedure: TOTAL KNEE ARTHROPLASTY;  Surgeon: Vickki Hearing, MD;  Location: [facility];  Service: Orthopedics;  Laterality: Right;      Social History:      Social History  Substance Use Topics  . Smoking status: Current Some Day Smoker    Packs/day: 0.25    Years: 3.00    Types: Cigarettes  . Smokeless tobacco: Never Used  . Alcohol use No       Family History :     Family History  Problem Relation Age of Onset  . Cancer Mother   . Arthritis Mother   . Hypotension Father   . Cancer Sister   . Hypotension Sister   . Colon cancer Neg Hx       Home Medications:   Prior to Admission  medications   Medication Sig Start Date End Date Taking? Authorizing Provider  acetaminophen (TYLENOL) 325 MG tablet Take 650 mg by mouth 3 (three) times daily.    Yes [provider]  Amino Acids-Protein Hydrolys (FEEDING SUPPLEMENT, PRO-STAT SUGAR FREE 64,) LIQD Take 30 mLs by mouth 2 (two) times daily.   Yes [provider]  amLODipine (NORVASC) 5 MG tablet Take 1 tablet by mouth Daily. 05/24/12  Yes [provider]  calcium citrate (CALCITRATE - DOSED IN MG ELEMENTAL CALCIUM) 950 MG tablet Take 200 mg of elemental calcium by mouth daily.   Yes [provider]  iron polysaccharides (NIFEREX) 150 MG capsule Take 150 mg by mouth daily.   Yes [provider]  latanoprost (XALATAN) 0.005 % ophthalmic solution Place 1 drop into both eyes at bedtime.   Yes [provider]  Menthol, Topical Analgesic, (BIOFREEZE) 4 % GEL Apply to right knee every 8 hours.   Yes [provider]  Multiple Vitamins-Minerals (DECUBI-VITE) CAPS Take 1 capsule by mouth daily.   Yes [provider]    polyethylene glycol (MIRALAX / GLYCOLAX) packet Take 17 g by mouth daily as needed for mild constipation or moderate constipation.    Yes [provider]  Potassium Chloride ER 20 MEQ TBCR Take 20 mEq by mouth daily at 6 (six) AM.   Yes [provider]  promethazine (PHENERGAN) 25 MG tablet Take 25 mg by mouth every 6 (six) hours as needed for nausea or vomiting.   Yes [provider]  senna-docusate (SENOKOT-S) 8.6-50 MG tablet Take 1 tablet by mouth 2 (two) times daily.   Yes [provider]  traMADol (ULTRAM) 50 MG tablet Take 50 mg by mouth 4 (four) times daily.   Yes [provider]  vitamin C (ASCORBIC ACID) 500 MG tablet Take 500 mg by mouth 2 (two) times daily.    Yes [provider]  Vitamin D, Cholecalciferol, 1000 units TABS Give 1 tablet by mouth daily   Yes [provider]     Allergies:    No Known Allergies   Physical Exam:   Vitals  Blood pressure (!) 128/59, pulse 69, temperature 98.2 F (36.8 C), temperature source Oral, resp. rate 15, height 5\' 5"  (1.651 m), weight 76.2 kg (168 lb), SpO2 100 %.  1.  General: Appears in no acute distress  2. Psychiatric: Alert and oriented 3, normal mood and affect, judgment and insight normal, thought content normal.  3. Neurologic: No focal neurological deficits, all cranial nerves intact.Strength 5/5 all 4 extremities, sensation intact all 4 extremities, plantars down going.  4. Eyes :  anicteric sclerae, moist conjunctivae, + pallor in palpebral conjunctiva  5. ENMT:  Oropharynx clear with moist mucous membranes, dentures in place  6. Neck:  supple, no cervical lymphadenopathy appriciated, No thyromegaly  7. Respiratory : Normal respiratory effort, good air movement bilaterally, clear to auscultation bilaterally  8. Cardiovascular : RRR, no gallops, rubs or murmurs, trace leg edema bilaterally  9. Gastrointestinal:  Positive bowel sounds, abdomen soft,  non-tender to palpation,no hepatosplenomegaly, no rigidity or guarding       10. Skin:  Multiple stage II decubitus ulcers  noted in the buttock, no discharge noted  11.Musculoskeletal:  Mild tenderness to palpation in the right knee, no erythema noted.    Data Review:    CBC  Recent Labs Lab 12/01/16 1621 12/01/16 1647  WBC 6.6  --   HGB 11.3* 12.6  HCT 35.0* 37.0  PLT 595*  --   MCV 78.7  --   MCH 25.4*  --   MCHC 32.3  --   RDW 16.2*  --   LYMPHSABS 1.8  --   MONOABS 0.8  --   EOSABS 0.0  --   BASOSABS 0.0  --    ------------------------------------------------------------------------------------------------------------------  Chemistries   Recent Labs Lab 12/01/16 1621 12/01/16 1647  NA 134* 135  K 3.2* 3.5  CL 95* 95*  CO2 29  --   GLUCOSE 86 85  BUN 13 15  CREATININE 0.62 0.80  CALCIUM 9.1  --   AST 15  --   ALT 10*  --   ALKPHOS 87  --   BILITOT 0.7  --    ------------------------------------------------------------------------------------------------------------------  ------------------------------------------------------------------------------------------------------------------ GFR: Estimated Creatinine Clearance: 57.3 mL/min (by C-G formula based on SCr of 0.8 mg/dL). Liver Function Tests:  Recent Labs Lab 12/01/16 1621  AST 15  ALT 10*  ALKPHOS 87  BILITOT 0.7  PROT 7.2  ALBUMIN 3.3*    --------------------------------------------------------------------------------------------------------------- Urine analysis:    Component Value Date/Time   COLORURINE YELLOW 12/01/2016 1622   APPEARANCEUR TURBID (A) 12/01/2016 1622   LABSPEC 1.025 12/01/2016 1622   PHURINE 6.0 12/01/2016 1622   GLUCOSEU NEGATIVE 12/01/2016 1622   HGBUR LARGE (A) 12/01/2016 1622   BILIRUBINUR SMALL (A) 12/01/2016 1622   KETONESUR 40 (A) 12/01/2016 1622   PROTEINUR >300 (A) 12/01/2016 1622   NITRITE POSITIVE (A) 12/01/2016 1622   LEUKOCYTESUR MODERATE  (A) 12/01/2016 1622      Imaging Results:   None    Assessment & Plan:    Active Problems:   Anemia   Hypertension   Hypokalemia   Pressure ulcer, stage 2   UTI (urinary tract infection)   1. UTI/? Asymptomatic bacteriuria- patient has chronic Foley catheter which was placed at skilled facility to prevent soiling of decubitus ulcers, she does have abnormal UA complains of dysuria. She has positive nitrite and to numerous to count WBC  In UA. She has been started empirically on ceftriaxone, will continue ceftriaxone per pharmacy consultation. Follow urine culture results. 2. Right knee pain- patient is status post right knee replacement, complains of right knee pain, which is chronic as per patient. Her last knee x-ray from March 2018 showed no significant abnormality. Will continue tramadol when necessary for pain. His right knee pain worsens during this hospitalization consider orthopedics consultation. 3. Chronic anemia- patient has chronic anemia today hemoglobin is 12.6, lab work from 2017 showed severe iron deficiency anemia with iron 14, saturation of 6%. Colonoscopy from 2015, showed redundant colon. We'll start ferrous gluconate tablet once a day. 4. Hypokalemia- patient has history of chronic hypokalemia and takes potassium supplementation at home. Will continue K-Dur 20 mg by mouth daily 5. Hypertension- blood pressure is well controlled, continue amlodipine 5 mg by mouth daily. 6. Decubitus ulcer- patient has stage II decubitus ulcers on the buttock, will consult wound care in a.m.  7. Generalized weakness- will consult physical therapy in a.m.   DVT Prophylaxis-   Lovenox   AM Labs Ordered, also please review Full Orders  Family Communication: Admission, patients condition and plan of care including tests being ordered have been discussed with the patient who indicate understanding and agree with the plan and Code Status.  Code Status:  Full code  Admission status:  Observation    Time spent in minutes : 60 minutes   Anna Hanna S M.D on 12/01/2016 at 7:38 PM  Between 7am  to 7pm - Pager - [phone]. After 7pm go to [email or web address] - password [facility]  [facility] - Office  [phone]

## 2016-12-01 NOTE — ED Notes (Signed)
Current foley cath removed due to UA order and foley tubing and collection bag dirty

## 2016-12-01 NOTE — ED Notes (Signed)
Pt very difficult to obtain IV site . Utilizing ultrasound

## 2016-12-01 NOTE — ED Triage Notes (Signed)
Pt sent after social worker visited her at home.  Pt has foley cath, but not sure why and has burning in vaginal area with foul, cloudy, sedimented urine.  Also pressure ulcer on right hip.

## 2016-12-01 NOTE — Progress Notes (Signed)
Pharmacy Antibiotic Note  Anna Hanna is a 82 y.o. [name] admitted on 12/01/2016 with UTI.  Pharmacy has been consulted for Ceftriaxone dosing.  Plan: Ceftriaxone 1 GM IV every 24 hours Labs per protocol  Height: 5\' 5"  (165.1 cm) Weight: 168 lb (76.2 kg) IBW/kg (Calculated) : 57  Temp (24hrs), Avg:98.2 F (36.8 C), Min:98.2 F (36.8 C), Max:98.2 F (36.8 C)   Recent Labs Lab 12/01/16 1621 12/01/16 1647 12/01/16 1648  WBC 6.6  --   --   CREATININE 0.62 0.80  --   LATICACIDVEN  --   --  1.21    Estimated Creatinine Clearance: 57.3 mL/min (by C-G formula based on SCr of 0.8 mg/dL).    No Known Allergies  Antimicrobials this admission: Ceftriaxone 5/11 >>    Thank you for allowing pharmacy to be a part of this patient's care.  Josephine Igoittman, Mata Rowen Bennett 12/01/2016 8:42 PM

## 2016-12-02 DIAGNOSIS — T83511A Infection and inflammatory reaction due to indwelling urethral catheter, initial encounter: Secondary | ICD-10-CM | POA: Diagnosis not present

## 2016-12-02 DIAGNOSIS — L8995 Pressure ulcer of unspecified site, unstageable: Secondary | ICD-10-CM | POA: Diagnosis not present

## 2016-12-02 DIAGNOSIS — I10 Essential (primary) hypertension: Secondary | ICD-10-CM

## 2016-12-02 DIAGNOSIS — L89309 Pressure ulcer of unspecified buttock, unspecified stage: Secondary | ICD-10-CM | POA: Diagnosis not present

## 2016-12-02 DIAGNOSIS — L8992 Pressure ulcer of unspecified site, stage 2: Secondary | ICD-10-CM | POA: Diagnosis not present

## 2016-12-02 DIAGNOSIS — E876 Hypokalemia: Secondary | ICD-10-CM | POA: Diagnosis not present

## 2016-12-02 DIAGNOSIS — N390 Urinary tract infection, site not specified: Secondary | ICD-10-CM

## 2016-12-02 DIAGNOSIS — L89310 Pressure ulcer of right buttock, unstageable: Secondary | ICD-10-CM | POA: Diagnosis not present

## 2016-12-02 DIAGNOSIS — L89302 Pressure ulcer of unspecified buttock, stage 2: Secondary | ICD-10-CM

## 2016-12-02 LAB — COMPREHENSIVE METABOLIC PANEL
ALT: 10 U/L — AB (ref 14–54)
ANION GAP: 12 (ref 5–15)
AST: 14 U/L — ABNORMAL LOW (ref 15–41)
Albumin: 3.1 g/dL — ABNORMAL LOW (ref 3.5–5.0)
Alkaline Phosphatase: 83 U/L (ref 38–126)
BUN: 8 mg/dL (ref 6–20)
CHLORIDE: 94 mmol/L — AB (ref 101–111)
CO2: 27 mmol/L (ref 22–32)
CREATININE: 0.5 mg/dL (ref 0.44–1.00)
Calcium: 8.4 mg/dL — ABNORMAL LOW (ref 8.9–10.3)
Glucose, Bld: 146 mg/dL — ABNORMAL HIGH (ref 65–99)
Potassium: 2.8 mmol/L — ABNORMAL LOW (ref 3.5–5.1)
SODIUM: 133 mmol/L — AB (ref 135–145)
Total Bilirubin: 0.6 mg/dL (ref 0.3–1.2)
Total Protein: 7 g/dL (ref 6.5–8.1)

## 2016-12-02 LAB — CBC
HCT: 37.1 % (ref 36.0–46.0)
Hemoglobin: 12 g/dL (ref 12.0–15.0)
MCH: 25.6 pg — AB (ref 26.0–34.0)
MCHC: 32.3 g/dL (ref 30.0–36.0)
MCV: 79.1 fL (ref 78.0–100.0)
PLATELETS: 649 10*3/uL — AB (ref 150–400)
RBC: 4.69 MIL/uL (ref 3.87–5.11)
RDW: 16.2 % — AB (ref 11.5–15.5)
WBC: 6.6 10*3/uL (ref 4.0–10.5)

## 2016-12-02 MED ORDER — POTASSIUM CHLORIDE CRYS ER 20 MEQ PO TBCR
40.0000 meq | EXTENDED_RELEASE_TABLET | ORAL | Status: AC
  Administered 2016-12-02 (×2): 40 meq via ORAL
  Filled 2016-12-02 (×2): qty 2

## 2016-12-02 MED ORDER — ADULT MULTIVITAMIN W/MINERALS CH
1.0000 | ORAL_TABLET | Freq: Every day | ORAL | Status: DC
  Administered 2016-12-02 – 2016-12-04 (×3): 1 via ORAL
  Filled 2016-12-02 (×3): qty 1

## 2016-12-02 NOTE — Progress Notes (Signed)
Initial Nutrition Assessment  DOCUMENTATION CODES:  Not applicable  INTERVENTION:  Would recommend obtaining a standing weight to confirm extraordinary wt loss. Appears to have lost 25% of her BW (54 lbs) in last ~6 months??  Ensure Enlive po BID, each supplement provides 350 kcal and 20 grams of protein  Will order 30 mL Prostat BID, each supplement provides 100 kcal and 15 grams of protein.  MVI with minerals.   NUTRITION DIAGNOSIS:  Unintentional weight loss related to  Much increased nutrient needs due to several pressure wounds? as evidenced by loss of 25% of her be x 6 months  GOAL:  Patient will meet greater than or equal to 90% of their needs  MONITOR:  PO intake, Supplement acceptance, Weight trends, Labs, Skin  REASON FOR ASSESSMENT:  Malnutrition Screening Tool    ASSESSMENT:  82 y/o female PMHx HTN, HLD, R Knee replacement. Presented with generalized weakness. Worked up for UTI. Admitted for management.   RD operating remotely. Per chart review. The patient has list an extraordinary amount of wt since her R knee replacement last September. She was weighed as 209 lbs at that time. She had fallen to 200 lbs in early December. Now listed as 156 lbs, she has lost neatly a quarter of her BW in the past June. She has lost >7.5% of her BW since acute hospitalization in March   There is no mention of poor intake or weight loss per documentation. She is eating 50% of her meals right now.   Would definitely benefit from obtaining a standing weight.   Physical Exam: unable to conduct  Meds: Ensure, Prostat, Iron, KCL, VIt C, IVF, Iv abx, zofran Labs: Albumin: 3.1, K:2.8, BG 85-145   Recent Labs Lab 12/01/16 1621 12/01/16 1647 12/02/16 0609  NA 134* 135 133*  K 3.2* 3.5 2.8*  CL 95* 95* 94*  CO2 29  --  27  BUN 13 15 8   CREATININE 0.62 0.80 0.50  CALCIUM 9.1  --  8.4*  GLUCOSE 86 85 146*   Diet Order:  Diet regular Room service appropriate? Yes; Fluid  consistency: Thin  Skin: 4 different stage 2 wounds listed to buttocks  Last BM:  Unknown  Height:  Ht Readings from Last 1 Encounters:  12/01/16 5\' 5"  (1.651 m)   Weight:  Wt Readings from Last 1 Encounters:  12/01/16 156 lb 8.4 oz (71 kg)   Wt Readings from Last 10 Encounters:  12/01/16 156 lb 8.4 oz (71 kg)  11/15/16 168 lb (76.2 kg)  11/07/16 188 lb 9.6 oz (85.5 kg)  10/05/16 188 lb 9.6 oz (85.5 kg)  10/01/16 169 lb 1.5 oz (76.7 kg)  06/23/16 210 lb (95.3 kg)  05/05/16 208 lb (94.3 kg)  04/02/16 210 lb 8.6 oz (95.5 kg)  03/30/16 209 lb (94.8 kg)  03/28/16 209 lb (94.8 kg)   Ideal Body Weight:  56.82 kg  BMI:  Body mass index is 26.05 kg/m.  Estimated Nutritional Needs:  Kcal:  1800-2000 (25-28 kcal/kg bw) Protein:  80-90g Pro (1.4-1.6 g/kg bw) Fluid:  >1.8 L fluid (25 ml/kg bw)  EDUCATION NEEDS:  No education needs identified at this time  Christophe LouisNathan Franks RD, LDN, CNSC Clinical Nutrition Pager: [phone] 12/02/2016 1:53 PM

## 2016-12-02 NOTE — Progress Notes (Signed)
Wound care ordered for decubitus on right and left lower buttocks.  Inspection reveals decubitus to be clean and dry.  There is an odor coming from under the sheets on the bed.  I offered to give the patient a bath, clean and change foam dressings, patient wishes to wait to later.  Patient also commented on the odor and said that it was one of her sheets.  Offered again to give bath, change dressings, and change sheets, patient wishes to wait to later.

## 2016-12-02 NOTE — Progress Notes (Signed)
PROGRESS NOTE  ADRIELLA ESSEX ZOX:[id number] DOB: [birth year] DOA: 12/01/2016 PCP: [name], MD  Brief Narrative: 82 year old woman who left short-term rehabilitation AMA 4/25 who presented to the emergency department 5/11 for generalized weakness, difficulty walking secondary to knee pain. Home health noted cloudy urine in Foley bag and recommended patient come to the hospital. She was admitted for UTI.  Assessment/Plan #1: UTI, catheter associated (catheter present on admission). Catheter in place for moisture management and to allow for wound healing -Clinically improving. Continue empiric antibiotic. Follow-up culture data.  #2: Hypokalemia. -Aggressive repletion -Check magnesium  #3: Right knee pain status post right knee replacement. -Appears stable. -Continue tramadol, Tylenol as needed  #4: Unsteady gait, fall risk, generalized weakness.  -Resume Continue home health on discharge  #5: Essential hypertension stable. - continue amlodipine    #6: Slow transit constipation -Continue MiraLAX as needed.  -Senna twice a day    #7: Decubitus ulcer of buttock, stage II, present on admission -Wound care. Resume home health on discharge.   Continue antibiotic, aggressive potassium repletion, repeat BMP in a.m. Likely discharge 5/13.  DVT prophylaxis: enoxaparin Code Status: full Family Communication: none Disposition Plan: home, resume HH    Brendia Sacks, MD  [facility] Direct contact: [phone] --Via amion app OR  --[email or web address]; password [email or web address]  7PM-7AM contact night coverage as above 12/02/2016, 1:01 PM  LOS: 0 days   Consultants:    Procedures:    Antimicrobials:  Ceftriaxone 5/11 >>  Interval history/Subjective: Feels better today, more alert. Continues to have some right knee pain.  Objective: Vitals: Afebrile, temperature 98.7, respirations 15, pulse 112, blood pressure 160/84, SPO2 100% on room air   Intake/Output Summary  (Last 24 hours) at 12/02/16 1301 Last data filed at 12/02/16 0900  Gross per 24 hour  Intake           676.25 ml  Output             1600 ml  Net          -923.75 ml     Filed Weights   12/01/16 1508 12/01/16 2328  Weight: 76.2 kg (168 lb) 71 kg (156 lb 8.4 oz)    Exam:     Constitutional: Appears calm, comfortable  Cardiovascular: Regular rate and rhythm. No murmur, rub or gallop. No significant lower extremity edema.  Respiratory: Clear to auscultation bilaterally. No wheezes, rales or rhonchi. Normal respiratory effort.  Abdomen: Soft, nontender, nondistended  Psychiatric: Grossly normal mood and affect. Speech fluent and appropriate   I have personally reviewed the following:   Labs:  Potassium 2.8, BUN and creatinine within normal limits. LFTs unremarkable.  Lactic acid within normal limits on admission  CBC unremarkable  Urinalysis grossly abnormal on admission, WBC too numerous to count  Urine culture pending  Imaging studies:    Medical tests:    Test discussed with performing physician:    Decision to obtain old records:    Review and summation of old records:  4/25 discharged from skilled nursing facility (hospitalized for rhabdomyolysis, hypothermia and acute kidney injury): 5 fall risk, radiation pressure ulcer management wound care, pain managed by physical medicine and rehabilitation. She was discharged home with home health physical and occupational therapy as well as home health RN for wound care and Foley catheter management. Home health aide to assist with ADLs.  Scheduled Meds: . acetaminophen  650 mg Oral TID  . amLODipine  5 mg Oral Daily  . enoxaparin (LOVENOX) injection  40 mg Subcutaneous Q24H  . feeding supplement (ENSURE ENLIVE)  237 mL Oral BID BM  . feeding supplement (PRO-STAT SUGAR FREE 64)  30 mL Oral BID  . ferrous gluconate  324 mg Oral Q breakfast  . iron polysaccharides  150 mg Oral Daily  . latanoprost  1 drop  Both Eyes QHS  . potassium chloride  20 mEq Oral Q0600  . potassium chloride  40 mEq Oral Q4H  . sodium chloride flush  3 mL Intravenous Q12H  . traMADol  50 mg Oral Q6H  . vitamin C  500 mg Oral BID   Continuous Infusions: . sodium chloride    . sodium chloride 75 mL/hr at 12/02/16 1000  . cefTRIAXone (ROCEPHIN)  IV      Principal Problem:   UTI (urinary tract infection) due to urinary indwelling catheter (HCC) Active Problems:   Hypokalemia   Pressure ulcer, stage 2   Benign essential HTN   LOS: 0 days

## 2016-12-03 DIAGNOSIS — I10 Essential (primary) hypertension: Secondary | ICD-10-CM | POA: Diagnosis not present

## 2016-12-03 DIAGNOSIS — E876 Hypokalemia: Secondary | ICD-10-CM | POA: Diagnosis not present

## 2016-12-03 DIAGNOSIS — N390 Urinary tract infection, site not specified: Secondary | ICD-10-CM | POA: Diagnosis present

## 2016-12-03 DIAGNOSIS — T83511A Infection and inflammatory reaction due to indwelling urethral catheter, initial encounter: Secondary | ICD-10-CM | POA: Diagnosis not present

## 2016-12-03 DIAGNOSIS — L89302 Pressure ulcer of unspecified buttock, stage 2: Secondary | ICD-10-CM | POA: Diagnosis not present

## 2016-12-03 LAB — BASIC METABOLIC PANEL
Anion gap: 9 (ref 5–15)
BUN: 11 mg/dL (ref 6–20)
CO2: 25 mmol/L (ref 22–32)
Calcium: 8.7 mg/dL — ABNORMAL LOW (ref 8.9–10.3)
Chloride: 101 mmol/L (ref 101–111)
Creatinine, Ser: 0.51 mg/dL (ref 0.44–1.00)
GFR calc Af Amer: >60 mL/min (ref >60)
GLUCOSE: 92 mg/dL (ref 65–99)
POTASSIUM: 3.8 mmol/L (ref 3.5–5.1)
Sodium: 135 mmol/L (ref 135–145)

## 2016-12-03 LAB — MAGNESIUM: Magnesium: 1.7 mg/dL (ref 1.7–2.4)

## 2016-12-03 MED ORDER — SENNOSIDES-DOCUSATE SODIUM 8.6-50 MG PO TABS
1.0000 | ORAL_TABLET | Freq: Two times a day (BID) | ORAL | Status: DC
  Administered 2016-12-03 – 2016-12-04 (×2): 1 via ORAL
  Filled 2016-12-03 (×3): qty 1

## 2016-12-03 NOTE — Discharge Summary (Addendum)
Physician Discharge Summary  Anna Hanna:[id number] DOB: April 27, 1935 DOA: 12/01/2016  PCP: Anna Morgan, MD  Admit date: 12/01/2016 Discharge date: 12/04/2016  Recommendations for Outpatient Follow-up:  1. Follow-up resolution of UTI.  2. Resume home health, RN, PT, wound care. Wound care recommended applying pH balanced skin cleanser daily, pat gently dry, apply zinc-based cream to bilateral buttocks twice a day and PRN soilage. No disposable briefs or underpads and turn and reposition every two hours. Float heels with a pillow beneath the calves.   Follow-up Information    Anna Morgan, MD. Schedule an appointment as soon as possible for a visit in 1 week(s).   Specialty:  Family Medicine Contact information: [address] [place] [address] [phone]           Follow-up Information    Anna Morgan, MD. Schedule an appointment as soon as possible for a visit in 1 week(s).   Specialty:  Family Medicine Contact information: [address] [place] [address] [phone]         Discharge Diagnoses:  1. UTI, catheter associated, present on admission 2. Hypokalemia 3. Right ischium full thickness unstageable injury secondary to pressure and moisture, present on admission  Discharge Condition: improved Disposition: home  Diet recommendation: Regular  Filed Weights   12/01/16 1508 12/01/16 2328  Weight: 76.2 kg (168 lb) 71 kg (156 lb 8.4 oz)    History of present illness:  82 year old woman who left short-term rehabilitation AMA 4/25 who presented to the emergency department 5/11 for generalized weakness, difficulty walking secondary to knee pain. Home health noted cloudy urine in Foley bag and recommended patient come to the hospital. She was admitted for UTI.  Hospital Course:  Patient was treated with empiric ceftriaxone with rapid clinical improvement. Subsequent culture revealed MRSA. Discussed with infectious disease Dr. [name], this  is felt to be colonization, as MRSA is in uncommon pathogen in the bladder and the patient has improved substantially on ceftriaxone. Therefore will transition to Ceftin as presumably ceftriaxone treated underlying organism not identified. Hypokalemia resolved with repletion. Hospitalization was uncomplicated. Patient stable for discharge. She will resume home health therapy on discharge.   Antimicrobials:  Ceftriaxone 5/11 >> 5/13  Ceftin 5/14 >> 5/17  Today's assessment: S: Feels okay. Ambulating to the bathroom okay. Eating okay. O: Vitals: Afebrile, temperature 98.6, respirations 18, pulse 81, blood pressure 104/52, SPO2 99% on room air.   Constitutional. Appears calm, comfortable.  Respiratory. Clear to auscultation bilaterally. No wheezes, rales or rhonchi. Normal respiratory effort.  Cardiovascular. Regular rate and rhythm. No murmur, rub or gallop.  Psychiatric. Grossly normal mood and affect. Speech fluent and appropriate.  Discharge Instructions  Discharge Instructions    Diet general    Complete by:  As directed    Discharge instructions    Complete by:  As directed    Call your physician or seek immediate medical attention for fever, pain, confusion, shortness of breath or worsening of condition.   Increase activity slowly    Complete by:  As directed      Allergies as of 12/04/2016   No Known Allergies     Medication List    TAKE these medications   acetaminophen 325 MG tablet Commonly known as:  TYLENOL Take 650 mg by mouth 3 (three) times daily.   amLODipine 5 MG tablet Commonly known as:  NORVASC Take 1 tablet by mouth Daily.   BIOFREEZE 4 % Gel Generic drug:  Menthol (Topical Analgesic) Apply  to right knee every 8 hours.   calcium citrate 950 MG tablet Commonly known as:  CALCITRATE - dosed in mg elemental calcium Take 200 mg of elemental calcium by mouth daily.   cefUROXime 500 MG tablet Commonly known as:  CEFTIN Take 1 tablet (500 mg  total) by mouth 2 (two) times daily with a meal.   DECUBI-VITE Caps Take 1 capsule by mouth daily.   feeding supplement (PRO-STAT SUGAR FREE 64) Liqd Take 30 mLs by mouth 2 (two) times daily.   iron polysaccharides 150 MG capsule Commonly known as:  NIFEREX Take 150 mg by mouth daily.   latanoprost 0.005 % ophthalmic solution Commonly known as:  XALATAN Place 1 drop into both eyes at bedtime.   polyethylene glycol packet Commonly known as:  MIRALAX / GLYCOLAX Take 17 g by mouth daily as needed for mild constipation or moderate constipation.   Potassium Chloride ER 20 MEQ Tbcr Take 20 mEq by mouth daily at 6 (six) AM.   promethazine 25 MG tablet Commonly known as:  PHENERGAN Take 25 mg by mouth every 6 (six) hours as needed for nausea or vomiting.   senna-docusate 8.6-50 MG tablet Commonly known as:  Senokot-S Take 1 tablet by mouth 2 (two) times daily.   traMADol 50 MG tablet Commonly known as:  ULTRAM Take 50 mg by mouth 4 (four) times daily.   vitamin C 500 MG tablet Commonly known as:  ASCORBIC ACID Take 500 mg by mouth 2 (two) times daily.   Vitamin D (Cholecalciferol) 1000 units Tabs Give 1 tablet by mouth daily      No Known Allergies  The results of significant diagnostics from this hospitalization (including imaging, microbiology, ancillary and laboratory) are listed below for reference.    Significant Diagnostic Studies: No results found.  Microbiology: Recent Results (from the past 240 hour(s))  Urine culture     Status: Abnormal   Collection Time: 12/01/16  4:22 PM  Result Value Ref Range Status   Specimen Description URINE, CATHETERIZED  Final   Special Requests NONE  Final   Culture (A)  Final    >=100,000 COLONIES/mL METHICILLIN RESISTANT STAPHYLOCOCCUS AUREUS   Report Status 12/04/2016 FINAL  Final   Organism ID, Bacteria METHICILLIN RESISTANT STAPHYLOCOCCUS AUREUS (A)  Final      Susceptibility   Methicillin resistant staphylococcus  aureus - MIC*    CIPROFLOXACIN >=8 RESISTANT Resistant     GENTAMICIN <=0.5 SENSITIVE Sensitive     NITROFURANTOIN <=16 SENSITIVE Sensitive     OXACILLIN >=4 RESISTANT Resistant     TETRACYCLINE <=1 SENSITIVE Sensitive     VANCOMYCIN 1 SENSITIVE Sensitive     TRIMETH/SULFA >=320 RESISTANT Resistant     CLINDAMYCIN <=0.25 SENSITIVE Sensitive     RIFAMPIN <=0.5 SENSITIVE Sensitive     Inducible Clindamycin NEGATIVE Sensitive     * >=100,000 COLONIES/mL METHICILLIN RESISTANT STAPHYLOCOCCUS AUREUS     Labs: Basic Metabolic Panel:  Recent Labs Lab 12/01/16 1621 12/01/16 1647 12/02/16 0609 12/03/16 0832  NA 134* 135 133* 135  K 3.2* 3.5 2.8* 3.8  CL 95* 95* 94* 101  CO2 29  --  27 25  GLUCOSE 86 85 146* 92  BUN 13 15 8 11   CREATININE 0.62 0.80 0.50 0.51  CALCIUM 9.1  --  8.4* 8.7*  MG  --   --   --  1.7   Liver Function Tests:  Recent Labs Lab 12/01/16 1621 12/02/16 0609  AST 15 14*  ALT 10*  10*  ALKPHOS 87 83  BILITOT 0.7 0.6  PROT 7.2 7.0  ALBUMIN 3.3* 3.1*   CBC:  Recent Labs Lab 12/01/16 1621 12/01/16 1647 12/02/16 0609  WBC 6.6  --  6.6  NEUTROABS 3.9  --   --   HGB 11.3* 12.6 12.0  HCT 35.0* 37.0 37.1  MCV 78.7  --  79.1  PLT 595*  --  649*    Principal Problem:   UTI (urinary tract infection) due to urinary indwelling catheter (HCC) Active Problems:   Hypokalemia   Pressure ulcer, stage 2   Benign essential HTN   UTI (urinary tract infection)   Time coordinating discharge: 6735 MINUTES  Signed:  Brendia Sacksaniel Goodrich, MD [facility] 12/04/2016, 11:14 AM

## 2016-12-03 NOTE — Progress Notes (Signed)
  PROGRESS NOTE  Anna RubensMelba J Hanna NWG:[id number] DOB: [birth year] DOA: 12/01/2016 PCP: Gareth MorganKnowlton, Steve, MD  Brief Narrative: 82 year old woman who left short-term rehabilitation AMA 4/25 who presented to the emergency department 5/11 for generalized weakness, difficulty walking secondary to knee pain. Home health noted cloudy urine in Foley bag and recommended patient come to the hospital. She was admitted for UTI. UTI, Staphylococcus aureus, catheter associated Assessment/Plan #1: UTI, catheter associated, Staphylococcus aureus. Sensitivities pending. Catheter present on admission. -Clinically improving. Urine is clear. Appears to be responding to antibiotics, therefore will not change antibiotics at this time. Given organism, follow-up sensitivities.  #2: Hypokalemia -Resolved.  #3: Right knee pain status post right knee replacement -Stable. Continue tramadol, Tylenol as needed  #4: Unsteady gait, fall risk, generalized weakness -Resume  #5: Essential hypertension. -Stable. Continue amlodipine.  #6: Decubitus ulcer buttock, stage II, present on admission -Continue wound care. Resume home health on discharge.   Continue ceftriaxone, follow-up culture data. Overall improving. Anticipate discharge home 5/14 pending sensitivities.  DVT prophylaxis: Enoxaparin Code Status: Full Family Communication: None Disposition Plan: Home    Brendia Sacksaniel Goodrich, MD  [facility] Direct contact: [phone] --Via amion app OR  --[email or web address]; password [email or web address]  7PM-7AM contact night coverage as above 12/03/2016, 12:05 PM  LOS: 1 day   Consultants:    Procedures:    Antimicrobials:  Ceftriaxone 5/11 >>  Interval history/Subjective: Feels better. Strength better. Eating okay.  Objective:  Exam:  Vitals: Afebrile, temperature 98.5, respirations 18, pulse 81, blood pressure 112/71, SPO2 100% on room air   Constitutional: Appears calm, comfortable.  Cardiovascular: Regular  rate and rhythm. No rub or gallop.  Respiratory: Clear to auscultation bilaterally.   I have personally reviewed the following:   Labs:  Basic metabolic panel unremarkable. Potassium 3.8. Magnesium 1.7.  Urine culture greater than 100,000 colonies staph aureus, sensitivities pending  Imaging studies:    Medical tests:    Test discussed with performing physician:    Decision to obtain old records:    Review and summation of old records:    Scheduled Meds: . acetaminophen  650 mg Oral TID  . amLODipine  5 mg Oral Daily  . enoxaparin (LOVENOX) injection  40 mg Subcutaneous Q24H  . feeding supplement (ENSURE ENLIVE)  237 mL Oral BID BM  . feeding supplement (PRO-STAT SUGAR FREE 64)  30 mL Oral BID  . ferrous gluconate  324 mg Oral Q breakfast  . iron polysaccharides  150 mg Oral Daily  . latanoprost  1 drop Both Eyes QHS  . multivitamin with minerals  1 tablet Oral Daily  . potassium chloride  20 mEq Oral Q0600  . senna-docusate  1 tablet Oral BID  . sodium chloride flush  3 mL Intravenous Q12H  . traMADol  50 mg Oral Q6H  . vitamin C  500 mg Oral BID   Continuous Infusions: . sodium chloride    . sodium chloride 75 mL/hr at 12/03/16 0123  . cefTRIAXone (ROCEPHIN)  IV Stopped (12/02/16 1809)    Principal Problem:   UTI (urinary tract infection) due to urinary indwelling catheter (HCC) Active Problems:   Hypokalemia   Pressure ulcer, stage 2   Benign essential HTN   LOS: 1 day

## 2016-12-04 DIAGNOSIS — L8992 Pressure ulcer of unspecified site, stage 2: Secondary | ICD-10-CM

## 2016-12-04 DIAGNOSIS — L89310 Pressure ulcer of right buttock, unstageable: Secondary | ICD-10-CM | POA: Diagnosis not present

## 2016-12-04 DIAGNOSIS — N390 Urinary tract infection, site not specified: Secondary | ICD-10-CM | POA: Diagnosis not present

## 2016-12-04 DIAGNOSIS — E876 Hypokalemia: Secondary | ICD-10-CM | POA: Diagnosis not present

## 2016-12-04 DIAGNOSIS — L89309 Pressure ulcer of unspecified buttock, unspecified stage: Secondary | ICD-10-CM

## 2016-12-04 DIAGNOSIS — L8995 Pressure ulcer of unspecified site, unstageable: Secondary | ICD-10-CM

## 2016-12-04 DIAGNOSIS — T83511A Infection and inflammatory reaction due to indwelling urethral catheter, initial encounter: Secondary | ICD-10-CM | POA: Diagnosis not present

## 2016-12-04 LAB — URINE CULTURE: Culture: >=100000 — AB

## 2016-12-04 MED ORDER — CEFUROXIME AXETIL 500 MG PO TABS: 500.0000 mg | ORAL_TABLET | Freq: Two times a day (BID) | ORAL | 0 refills | Status: DC

## 2016-12-04 MED ORDER — ZINC OXIDE 20 % EX OINT
TOPICAL_OINTMENT | Freq: Two times a day (BID) | CUTANEOUS | Status: DC
  Administered 2016-12-04: 11:00:00 via TOPICAL
  Filled 2016-12-04: qty 56.7

## 2016-12-04 NOTE — Progress Notes (Signed)
Patient's relative Valerie RoysJuanita King called and informed staff that she was going to patient's house at this time to meet patient and EMS. EMS services called and are in route to pick patient up for transport.

## 2016-12-04 NOTE — Care Management CC44 (Signed)
Condition Code 44 Documentation Completed  Patient Details  Name: Lysle RubensMelba J Manson MRN: [id number] Date of Birth: May 09, 1935   Condition Code 44 given:  Yes Patient signature on Condition Code 44 notice:  Yes Documentation of 2 MD's agreement:  Yes Code 44 added to claim:  Yes    Malcolm Metrohildress, Stan Cantave Demske, RN 12/04/2016, 10:40 AM

## 2016-12-04 NOTE — Evaluation (Addendum)
Physical Therapy Evaluation Patient Details Name: Anna Hanna MRN: [id number] DOB: [birth year] Today's Date: 12/04/2016   History of Present Illness  Anna Hanna is an 82yo black female who comes to [facility] on 5/11 wth generalized weakness, found to have UTI. She recently left STR AMA to home with an indwelling catheter due to a sacral ulcer. She c/o chronic right knee dysfunction; the medical record shows Rt TKA on 03/30/16 with DC to home with subsequent fall and suprcondylar fracture, and subsequent transfer to [facility] where a revision surgery took place. She then went to STR with continued contracture in the Rt knee. At this time, it is not clear that the patient has been able to perform any meaningful AMB since. She reports that she can't go home now, because no one is there to help her. She says she has been sleeping on the couch because she cannot get into the bed on her own.   Clinical Impression  Pt admitted with above diagnosis. Pt currently with functional limitations due to the deficits listed below (see "PT Problem List"). Pt unable to perform basic transfers and mobility in bed without minimal physical assistance and high falls anxiety. The patient has some disorientation to time, unclear if this is related to baseline cognitive impairment or acute changes. The patient has poor insight into her current limitations and prognosis of function related to the Right knee. She has a high level of need, warranting 24/7 supervision and has not been able to get sufficient assistance at home in spite of family involvement. Pt will benefit from skilled PT intervention to increase independence and safety with basic mobility in preparation for discharge to the venue listed below.       Follow Up Recommendations Supervision/Assistance - 24 hour;Home health PT (Appropriate for LTC at this time; potential neglect situation at home. )    Equipment Recommendations  Patient would benefit from a hospital bed  with a low-loft air mattress to help with improved healing of sacral ulcer.    Recommendations for Other Services       Precautions / Restrictions Precautions Precautions: Fall Restrictions Weight Bearing Restrictions: No      Mobility  Bed Mobility Overal bed mobility: Needs Assistance Bed Mobility: Supine to Sit;Sit to Supine     Supine to sit: Min assist Sit to supine: Min assist   General bed mobility comments: very slow, easily distracted, anxious, and fearful;   Transfers Overall transfer level: Needs assistance Equipment used: 1 person hand held assist Transfers: Sit to/from Stand Sit to Stand: From elevated surface;Max assist         General transfer comment: maintains the RLE in nonweightbearing ad lib. Unable to remain in stance for >5 seconds.   Ambulation/Gait                Stairs            Wheelchair Mobility    Modified Rankin (Stroke Patients Only)       Balance Overall balance assessment: History of Falls                                           Pertinent Vitals/Pain Pain Assessment: No/denies pain    Home Living Family/patient expects to be discharged to:: Private residence Living Arrangements: Alone Available Help at Discharge:  Renea Ee and Valerie Roys assist with ADL, but intermittently. Social  services was assisting with meals, and per care [facility] is currently following the patient. ) Type of Home: Mobile home Home Access: Stairs to enter;Ramped entrance     Home Layout: One level Home Equipment: Walker - 2 wheels;Cane - single point;Bedside commode;Wheelchair - power      Prior Function Level of Independence: Needs assistance   Gait / Transfers Assistance Needed: Pt [facility] to [facility] and w/c.  She requires assistance to get in/out of bed.    ADL's / Homemaking Assistance Needed: Pt states she requires assistance with dressing and bathing        Hand Dominance         Extremity/Trunk Assessment   Upper Extremity Assessment Upper Extremity Assessment: Generalized weakness    Lower Extremity Assessment Lower Extremity Assessment: Generalized weakness       Communication   Communication: No difficulties  Cognition     Overall Cognitive Status: No family/caregiver present to determine baseline cognitive functioning Area of Impairment: Orientation                 Orientation Level: Disoriented to;Time (cannot give the correct year, month, or date. )                    General Comments      Exercises     Assessment/Plan    PT Assessment Patient needs continued PT services  PT Problem List Decreased strength;Decreased activity tolerance;Decreased balance;Decreased mobility       PT Treatment Interventions Functional mobility training;Therapeutic activities;Therapeutic exercise;Balance training;Patient/family education    PT Goals (Current goals can be found in the Care Plan section)  Acute Rehab PT Goals PT Goal Formulation: Patient unable to participate in goal setting    Frequency Min 2X/week   Barriers to discharge Decreased caregiver support;Inaccessible home environment      Co-evaluation               AM-PAC PT "6 Clicks" Daily Activity  Outcome Measure Difficulty turning over in bed (including adjusting bedclothes, sheets and blankets)?: A Lot Difficulty moving from lying on back to sitting on the side of the bed? : A Lot Difficulty sitting down on and standing up from a chair with arms (e.g., wheelchair, bedside commode, etc,.)?: Total Help needed moving to and from a bed to chair (including a wheelchair)?: Total Help needed walking in hospital room?: Total Help needed climbing 3-5 steps with a railing? : Total 6 Click Score: 8    End of Session   Activity Tolerance: Patient limited by fatigue Patient left: in bed;with call bell/phone within reach;with bed alarm set Nurse Communication: Mobility  status PT Visit Diagnosis: Unsteadiness on feet (R26.81);Muscle weakness (generalized) (M62.81);Repeated falls (R29.6)    Time: [id number] PT Time Calculation (min) (ACUTE ONLY): 17 min   Charges:   PT Evaluation $PT Eval Low Complexity: 1 Procedure     PT G Codes:   PT G-Codes **NOT FOR INPATIENT CLASS** Functional Assessment Tool Used: AM-PAC 6 Clicks Basic Mobility Functional Limitation: Mobility: Walking and moving around Mobility: Walking and Moving Around Current Status ([id number](G8978): At least 80 percent but less than 100 percent impaired, limited or restricted Mobility: Walking and Moving Around Goal Status [phone](G8979): At least 60 percent but less than 80 percent impaired, limited or restricted    11:51 AM, 12/04/16 Rosamaria LintsAllan C Buccola, PT, DPT Physical Therapist - [place] [phone] [facility])  [phone] (mobile)   Buccola,Allan C 12/04/2016, 11:48 AM   *addendum  on 12/05/16 to update equipment recommendations from PT.   10:37 AM, 12/05/16 Rosamaria Lints, PT, DPT Physical Therapist at [facility] [phone] (office)

## 2016-12-04 NOTE — Consult Note (Signed)
WOC Nurse wound consult note Reason for Consult:Moisture associated skin damage to bilateral buttocks, present on admission.   Wound type:MASD (Incontinence associated) Pressure Injury POA: Yes  Right ischium with full thickness unstageable injury due to pressure and moisture,.  Measurement: Bilateral buttocks, extends 5 cm x 5 cm x 0.2 cm with unstageable injury noted, 50% fibrin slough.  Wound bed: pink and moist with 50% thin fibrin slough to right ischium Drainage (amount, consistency, odor) minimal serosanguinous  No odor.  Periwound:blanchable erythema Dressing procedure/placement/frequency:Will implement a mattress replacement with low air loss feature.  Patient is to be turned and repositioned from side to side and time in the supine position minimized.  HOB is to be at the lowest degree tolerated and below 30 degrees except for meals. POC for tissue repair and regeneration with be to use our pH balanced skin cleanser daily and pat gently dry. Apply Zinc based cream to bilateral buttocks twice daily and PRN soilage.  Only one underpad is to be used beneath the patient so that she get the maximum benefit from the low air loss therapy.  Off load heels with pillow beneath the calves.  Will not follow at this time.  Please re-consult if needed.  Maple HudsonKaren Culley Hedeen RN BSN CWON Pager [phone]

## 2016-12-04 NOTE — Care Management Note (Addendum)
Case Management Note  Patient Details  Name: Anna Hanna MRN: [id number] Date of Birth: 06/22/1935  Subjective/Objective:                  Pt admitted with UTI, sacral ulcer. Pt is from home, lives alone and reports having 2 caregivers, Anna Hanna and Anna Hanna. Pt reports sleeping on couch and being WC bound. She recently left rehab, per notes she left AMA, per pt she was discharged. [facility] but per [facility] rep pt did not answer her phone when calls were made. Pt is okay for them to be referred to again. Tim Justis, of [facility], will come visit pt in hospital today. Pt will need hospital bed with low air loss surface. Pt has chosen [facility] from list of DME providers. Alroy BailiffLinda Lothian, of [facility], aware of referral. Per RN [facility]. CM and CSW have attempted to contact DSS Anna Hanna(Anna Hanna [phone] ext [phone]) for info on pt home situation and determine if pt is safe to return. PT recommends 24/7 supervision, pt is aware of this and does not have the ability to have someone with her 24/7. Placement has been offered and pt refuses.   Action/Plan: Pt discharging home today with HH nursing/PT through [facility]. Pt will need to transport via EMS. CM and pt have made multiple calls to find someone to meet EMS at pt's home.  Plan remains for DC today once family/caregiver can be contacted.   Expected Discharge Date:  12/04/16               Expected Discharge Plan:  Home w Home Health Services  In-House Referral:  NA  Discharge planning Services  CM Consult  Post Acute Care Choice:  Durable Medical Equipment, Home Health Choice offered to:  Patient  DME Arranged:  Hospital bed DME Agency:  [facility]  HH Arranged:  RN, PT [facility]  Status of Service:  Completed, signed off   Addendum:  1545: Call returned from neighbor Anna Hanna who will NOT meet pt at home. She believes pt should go  to nursing home and reports pt does not have water. Another unsuccessful attempt was made to contact pt's daughter. CM contacted Anna Hanna with DSS who states pt's water has been turned back on at home and she will attempt to meet EMS at pts home. She will contact pt's RN soon to let her know if she will be able to do so. Pt updates and pt states she does have water at her home.   1500: call received from [place] with DSS. Pt has appointment with PCP Wednesday, Anna ensure's pt has transportation to appointments. She is also working to get pt aid services in the home. She is confident that pt will qualify. Anna Hanna [facility] when going to make visit and she will make sure pt answers door. Anna Hanna will attempt to contact Renea Eevelyn to be at pt's home for EMS to transport pt.    Malcolm Metrohildress, Lala Been Demske, RN 12/04/2016, 2:20 PM

## 2016-12-04 NOTE — Care Management (Signed)
    Durable Medical Equipment        Start     Ordered   12/04/16 1300  For home use only DME Hospital bed  Once    Question Answer Comment  Patient has (list medical condition): unstagable pressure injury   The above medical condition requires: Patient requires the ability to reposition frequently   Head must be elevated greater than: 30 degrees   Bed type Semi-electric   Support Surface: Low Air loss Mattress      12/04/16 1300

## 2016-12-12 ENCOUNTER — Emergency Department (HOSPITAL_COMMUNITY): Payer: Medicare HMO

## 2016-12-12 ENCOUNTER — Encounter (HOSPITAL_COMMUNITY): Payer: Self-pay | Admitting: Emergency Medicine

## 2016-12-12 ENCOUNTER — Observation Stay (HOSPITAL_COMMUNITY)
Admission: EM | Admit: 2016-12-12 | Discharge: 2016-12-14 | Disposition: A | Discharge status: Home / Self Care | Payer: Medicare HMO | Attending: Internal Medicine | Admitting: Internal Medicine

## 2016-12-12 DIAGNOSIS — N390 Urinary tract infection, site not specified: Secondary | ICD-10-CM | POA: Diagnosis present

## 2016-12-12 DIAGNOSIS — Z598 Other problems related to housing and economic circumstances: Secondary | ICD-10-CM | POA: Diagnosis not present

## 2016-12-12 DIAGNOSIS — W19XXXA Unspecified fall, initial encounter: Secondary | ICD-10-CM | POA: Diagnosis present

## 2016-12-12 DIAGNOSIS — I10 Essential (primary) hypertension: Secondary | ICD-10-CM | POA: Diagnosis not present

## 2016-12-12 DIAGNOSIS — F17210 Nicotine dependence, cigarettes, uncomplicated: Secondary | ICD-10-CM | POA: Insufficient documentation

## 2016-12-12 DIAGNOSIS — A419 Sepsis, unspecified organism: Secondary | ICD-10-CM

## 2016-12-12 DIAGNOSIS — L8990 Pressure ulcer of unspecified site, unspecified stage: Secondary | ICD-10-CM | POA: Insufficient documentation

## 2016-12-12 DIAGNOSIS — R4182 Altered mental status, unspecified: Secondary | ICD-10-CM | POA: Diagnosis not present

## 2016-12-12 DIAGNOSIS — Y92009 Unspecified place in unspecified non-institutional (private) residence as the place of occurrence of the external cause: Secondary | ICD-10-CM | POA: Diagnosis not present

## 2016-12-12 DIAGNOSIS — Z5989 Other problems related to housing and economic circumstances: Secondary | ICD-10-CM

## 2016-12-12 DIAGNOSIS — L8992 Pressure ulcer of unspecified site, stage 2: Secondary | ICD-10-CM | POA: Diagnosis present

## 2016-12-12 DIAGNOSIS — Z79899 Other long term (current) drug therapy: Secondary | ICD-10-CM | POA: Insufficient documentation

## 2016-12-12 DIAGNOSIS — G9341 Metabolic encephalopathy: Secondary | ICD-10-CM

## 2016-12-12 DIAGNOSIS — M25561 Pain in right knee: Secondary | ICD-10-CM

## 2016-12-12 DIAGNOSIS — R600 Localized edema: Secondary | ICD-10-CM

## 2016-12-12 DIAGNOSIS — T83511A Infection and inflammatory reaction due to indwelling urethral catheter, initial encounter: Secondary | ICD-10-CM

## 2016-12-12 DIAGNOSIS — A4181 Sepsis due to Enterococcus: Secondary | ICD-10-CM

## 2016-12-12 LAB — TROPONIN I

## 2016-12-12 LAB — COMPREHENSIVE METABOLIC PANEL
ALT: 17 U/L (ref 14–54)
ANION GAP: 12 (ref 5–15)
AST: 21 U/L (ref 15–41)
Albumin: 3.7 g/dL (ref 3.5–5.0)
Alkaline Phosphatase: 80 U/L (ref 38–126)
BILIRUBIN TOTAL: 0.9 mg/dL (ref 0.3–1.2)
BUN: 13 mg/dL (ref 6–20)
CO2: 27 mmol/L (ref 22–32)
Calcium: 9.4 mg/dL (ref 8.9–10.3)
Chloride: 96 mmol/L — ABNORMAL LOW (ref 101–111)
Creatinine, Ser: 0.54 mg/dL (ref 0.44–1.00)
GFR calc Af Amer: >60 mL/min (ref >60)
Glucose, Bld: 117 mg/dL — ABNORMAL HIGH (ref 65–99)
POTASSIUM: 3.6 mmol/L (ref 3.5–5.1)
SODIUM: 135 mmol/L (ref 135–145)
TOTAL PROTEIN: 8.3 g/dL — AB (ref 6.5–8.1)

## 2016-12-12 LAB — URINALYSIS, ROUTINE W REFLEX MICROSCOPIC
Bilirubin Urine: NEGATIVE
Glucose, UA: NEGATIVE mg/dL
Ketones, ur: 5 mg/dL — AB
Nitrite: NEGATIVE
PROTEIN: 30 mg/dL — AB
SPECIFIC GRAVITY, URINE: 1.016 (ref 1.005–1.030)
pH: 7 (ref 5.0–8.0)

## 2016-12-12 LAB — CBC WITH DIFFERENTIAL/PLATELET
BASOS ABS: 0 10*3/uL (ref 0.0–0.1)
Basophils Relative: 0 %
EOS ABS: 0.1 10*3/uL (ref 0.0–0.7)
EOS PCT: 1 %
HCT: 40.6 % (ref 36.0–46.0)
Hemoglobin: 13.3 g/dL (ref 12.0–15.0)
Lymphocytes Relative: 14 %
Lymphs Abs: 1.6 10*3/uL (ref 0.7–4.0)
MCH: 26 pg (ref 26.0–34.0)
MCHC: 32.8 g/dL (ref 30.0–36.0)
MCV: 79.3 fL (ref 78.0–100.0)
Monocytes Absolute: 0.8 10*3/uL (ref 0.1–1.0)
Monocytes Relative: 7 %
Neutro Abs: 8.7 10*3/uL — ABNORMAL HIGH (ref 1.7–7.7)
Neutrophils Relative %: 78 %
PLATELETS: 628 10*3/uL — AB (ref 150–400)
RBC: 5.12 MIL/uL — AB (ref 3.87–5.11)
RDW: 16 % — ABNORMAL HIGH (ref 11.5–15.5)
WBC: 11.2 10*3/uL — ABNORMAL HIGH (ref 4.0–10.5)

## 2016-12-12 LAB — LACTIC ACID, PLASMA
LACTIC ACID, VENOUS: 1.8 mmol/L (ref 0.5–1.9)
LACTIC ACID, VENOUS: 2 mmol/L — AB (ref 0.5–1.9)
LACTIC ACID, VENOUS: 1 mmol/L (ref 0.5–1.9)
Lactic Acid, Venous: 1.1 mmol/L (ref 0.5–1.9)

## 2016-12-12 LAB — CK: CK TOTAL: 95 U/L (ref 38–234)

## 2016-12-12 MED ORDER — SODIUM CHLORIDE 0.9 % IV SOLN
INTRAVENOUS | Status: DC
  Administered 2016-12-12 – 2016-12-13 (×2): via INTRAVENOUS

## 2016-12-12 MED ORDER — ACETAMINOPHEN 325 MG PO TABS
650.0000 mg | ORAL_TABLET | Freq: Four times a day (QID) | ORAL | Status: DC | PRN
  Administered 2016-12-12 – 2016-12-14 (×3): 650 mg via ORAL
  Filled 2016-12-12 (×3): qty 2

## 2016-12-12 MED ORDER — MAGNESIUM HYDROXIDE 400 MG/5ML PO SUSP: 30.0000 mL | Freq: Every day | ORAL | Status: DC | PRN

## 2016-12-12 MED ORDER — ACETAMINOPHEN 650 MG RE SUPP
650.0000 mg | Freq: Four times a day (QID) | RECTAL | Status: DC | PRN
Start: 2016-12-12 — End: 2016-12-14

## 2016-12-12 MED ORDER — ENOXAPARIN SODIUM 40 MG/0.4ML ~~LOC~~ SOLN
40.0000 mg | SUBCUTANEOUS | Status: DC
  Administered 2016-12-13 – 2016-12-14 (×2): 40 mg via SUBCUTANEOUS
  Filled 2016-12-12 (×2): qty 0.4

## 2016-12-12 MED ORDER — SODIUM CHLORIDE 0.9 % IV BOLUS (SEPSIS)
500.0000 mL | Freq: Once | INTRAVENOUS | Status: AC
  Administered 2016-12-12: 500 mL via INTRAVENOUS

## 2016-12-12 NOTE — ED Notes (Signed)
Received call from Tobi Bastosnna with Adult Protective Services, stating "her neighbor called me yesterday and said she had fallen and was in the floor and refused to let anyone call EMS, so she may have been laying in the floor since yesterday." States she will come to hospital today to speak with patient about moving to a facility.

## 2016-12-12 NOTE — ED Triage Notes (Signed)
Patient arrived via EMS. Patient states she fell at home. States that she slid down the bed and was on the floor. Patient does not know how long she was on the floor. Patient also states she does not know how long she was on the floor.   Patient covered in dried feces. Patient's Foley catheter was pulled out during fall.

## 2016-12-12 NOTE — ED Provider Notes (Signed)
[facility]PT Provider Note   CSN: [id number] Arrival date & time: 12/12/16  0758     History   Chief Complaint Chief Complaint  Patient presents with  . Fall    HPI Anna Hanna is a 82 y.o. female.  HPI  Pt was seen at 0820. Per EMS and pt report, c/o unknown onset and resolution of one episode of fall that occurred PTA. Pt does not know when she fell. EMS states pt was covered with dried feces and pulled her foley catheter out. Pt denies any complaints. Denies LOC, no neck or back pain, no CP/SOB, no abd pain, no N/V/D, no focal motor weakness, no tingling/numbness in extremities.    Past Medical History:  Diagnosis Date  . Glaucoma   . Hypercholesteremia   . Hypertension   . Hypokalemia   . Status post total right knee replacement     Patient Active Problem List   Diagnosis Date Noted  . Decubitus ulcer of right ischium, unstageable (HCC)   . UTI (urinary tract infection) 12/03/2016  . UTI (urinary tract infection) due to urinary indwelling catheter (HCC) 12/02/2016  . Benign essential HTN 12/02/2016  . Rhabdomyolysis 09/30/2016  . Immobility 09/30/2016  . Hypokalemia 09/30/2016  . Pressure ulcer, stage 2 09/30/2016  . Acute renal failure (HCC) 09/30/2016  . Hypothermia 09/30/2016  . 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 Hyponatremia 04/02/2016  . Hyperglycemia 04/02/2016  . Femur fracture, right (HCC) 04/02/2016  . Osteoarthritis of right knee 03/30/2016  . Arthritis of knee, right     Past Surgical History:  Procedure Laterality Date  . ABDOMINAL HYSTERECTOMY    . CATARACT EXTRACTION    . COLONOSCOPY N/A 12/10/2013   Procedure: COLONOSCOPY;  Surgeon: West BaliSandi L Fields, MD;  Location: [facility];  Service: Endoscopy;  Laterality: N/A;  8:30 AM  . TOTAL KNEE ARTHROPLASTY Right 03/30/2016   Procedure: TOTAL KNEE ARTHROPLASTY;  Surgeon: Vickki HearingStanley E Harrison, MD;  Location: [facility] ORS;   Service: Orthopedics;  Laterality: Right;    OB History    No data available       Home Medications    Prior to Admission medications   Medication Sig Start Date End Date Taking? Authorizing Provider  acetaminophen (TYLENOL) 325 MG tablet Take 650 mg by mouth 3 (three) times daily.     [provider]  Amino Acids-Protein Hydrolys (FEEDING SUPPLEMENT, PRO-STAT SUGAR FREE 64,) LIQD Take 30 mLs by mouth 2 (two) times daily.    [provider]  amLODipine (NORVASC) 5 MG tablet Take 1 tablet by mouth Daily. 05/24/12   [provider]  calcium citrate (CALCITRATE - DOSED IN MG ELEMENTAL CALCIUM) 950 MG tablet Take 200 mg of elemental calcium by mouth daily.    [provider]  cefUROXime (CEFTIN) 500 MG tablet Take 1 tablet (500 mg total) by mouth 2 (two) times daily with a meal. 12/04/16   Standley BrookingGoodrich, Daniel P, MD  iron polysaccharides (NIFEREX) 150 MG capsule Take 150 mg by mouth daily.    [provider]  latanoprost (XALATAN) 0.005 % ophthalmic solution Place 1 drop into both eyes at bedtime.    [provider]  Menthol, Topical Analgesic, (BIOFREEZE) 4 % GEL Apply to right knee every 8 hours.    [provider]  Multiple Vitamins-Minerals (DECUBI-VITE) CAPS Take 1 capsule by mouth daily.    [provider]  polyethylene glycol (MIRALAX / GLYCOLAX) packet  Take 17 g by mouth daily as needed for mild constipation or moderate constipation.     [provider]  Potassium Chloride ER 20 MEQ TBCR Take 20 mEq by mouth daily at 6 (six) AM.    [provider]  promethazine (PHENERGAN) 25 MG tablet Take 25 mg by mouth every 6 (six) hours as needed for nausea or vomiting.    [provider]  senna-docusate (SENOKOT-S) 8.6-50 MG tablet Take 1 tablet by mouth 2 (two) times daily.    [provider]  traMADol (ULTRAM) 50 MG tablet Take 50 mg by mouth 4 (four) times daily.    [provider]    vitamin C (ASCORBIC ACID) 500 MG tablet Take 500 mg by mouth 2 (two) times daily.     [provider]  Vitamin D, Cholecalciferol, 1000 units TABS Give 1 tablet by mouth daily    [provider]    Family History Family History  Problem Relation Age of Onset  . Cancer Mother   . Arthritis Mother   . Hypotension Father   . Cancer Sister   . Hypotension Sister   . Colon cancer Neg Hx     Social History Social History  Substance Use Topics  . Smoking status: Current Some Day Smoker    Packs/day: 0.25    Years: 3.00    Types: Cigarettes  . Smokeless tobacco: Never Used  . Alcohol use No     Allergies   Patient has no known allergies.   Review of Systems Review of Systems ROS: Statement: All systems negative except as marked or noted in the HPI; Constitutional: Negative for fever and chills. ; ; Eyes: Negative for eye pain, redness and discharge. ; ; ENMT: Negative for ear pain, hoarseness, nasal congestion, sinus pressure and sore throat. ; ; Cardiovascular: Negative for chest pain, palpitations, diaphoresis, dyspnea and peripheral edema. ; ; Respiratory: Negative for cough, wheezing and stridor. ; ; Gastrointestinal: Negative for nausea, vomiting, diarrhea, abdominal pain, blood in stool, hematemesis, jaundice and rectal bleeding. . ; ; Genitourinary: Negative for dysuria, flank pain and hematuria. ; ; Musculoskeletal: Negative for back pain and neck pain. Negative for swelling and trauma.; ; Skin: Negative for pruritus, rash, abrasions, blisters, bruising and skin lesion.; ; Neuro: Negative for headache, lightheadedness and neck stiffness. Negative for weakness, altered level of consciousness, altered mental status, extremity weakness, paresthesias, involuntary movement, seizure and syncope.       Physical Exam Updated Vital Signs BP 139/79   Pulse 96   Temp 98 F (36.7 C) (Oral)   Resp 18   Ht 5\' 5"  (1.651 m)   Wt 70.8 kg (156 lb)   SpO2 100%   BMI  25.96 kg/m    Patient Vitals for the past 24 hrs:  BP Temp Temp src Pulse Resp SpO2 Height Weight  12/12/16 1030 139/79 - - - 18 - - -  12/12/16 1015 - - - 96 17 100 % - -  12/12/16 1000 115/79 - - 97 20 99 % - -  12/12/16 0930 134/85 - - (!) 101 19 100 % - -  12/12/16 0900 129/79 - - 100 (!) 23 97 % - -  12/12/16 0801 - 98 F (36.7 C) Oral - - - 5\' 5"  (1.651 m) 70.8 kg (156 lb)      8:51:38 Orthostatic Vital Signs BP  Orthostatic Lying   BP- Lying: 120/83  Pulse- Lying: 103      Orthostatic Sitting  BP- Sitting: 128/86  Pulse- Sitting: 110  08:52:18 ED Notes BP  Patient unable to stand.     Physical Exam 0825: Physical examination:  Nursing notes reviewed; Vital signs and O2 SAT reviewed;  Constitutional: Well developed, Well nourished, In no acute distress; Head:  Normocephalic, atraumatic; Eyes: EOMI, PERRL, No scleral icterus; ENMT: Mouth and pharynx normal, Mucous membranes dry; Neck: Supple, Full range of motion, No lymphadenopathy; Cardiovascular: Regular rate and rhythm, No gallop; Respiratory: Breath sounds clear & equal bilaterally, No wheezes.  Speaking full sentences with ease, Normal respiratory effort/excursion; Chest: Nontender, Movement normal; Abdomen: Soft, Nontender, Nondistended, Normal bowel sounds; Genitourinary: No CVA tenderness; Spine:  No midline CS, TS, LS tenderness.;; Extremities: Pulses normal, No tenderness, No edema, No calf edema or asymmetry.; Neuro: Awake, alert, confused re: time, events. Major CN grossly intact. No facial droop. Speech clear. Grips equal. Moves all extremities on stretcher spontaneously and to command without apparent gross focal motor deficits.; Skin: Color normal, Warm, Dry.   ED Treatments / Results  Labs (all labs ordered are listed, but only abnormal results are displayed)   EKG  EKG Interpretation  Date/Time:  Tuesday Dec 12 2016 08:18:05 EDT Ventricular Rate:  99 PR Interval:    QRS Duration: 97 QT  Interval:  367 QTC Calculation: 471 R Axis:   58 Text Interpretation:  Sinus rhythm Borderline T wave abnormalities Baseline wander When compared with ECG of 03/28/2016 Nonspecific T wave abnormality is now Present Confirmed by [facility]  MD, Nicholos Johns [phone]) on 12/12/2016 8:36:48 AM       Radiology   Procedures Procedures (including critical care time)  Medications Ordered in ED Medications - No data to display   Initial Impression / Assessment and Plan / ED Course  I have reviewed the triage vital signs and the nursing notes.  Pertinent labs & imaging results that were available during my care of the patient were reviewed by me and considered in my medical decision making (see chart for details).  MDM Reviewed: previous chart, nursing note and vitals Reviewed previous: labs and ECG Interpretation: labs, ECG, x-ray and CT scan   Results for orders placed or performed during the hospital encounter of 12/12/16  Urinalysis, Routine w reflex microscopic  Result Value Ref Range   Color, Urine YELLOW YELLOW   APPearance HAZY (A) CLEAR   Specific Gravity, Urine 1.016 1.005 - 1.030   pH 7.0 5.0 - 8.0   Glucose, UA NEGATIVE NEGATIVE mg/dL   Hgb urine dipstick SMALL (A) NEGATIVE   Bilirubin Urine NEGATIVE NEGATIVE   Ketones, ur 5 (A) NEGATIVE mg/dL   Protein, ur 30 (A) NEGATIVE mg/dL   Nitrite NEGATIVE NEGATIVE   Leukocytes, UA LARGE (A) NEGATIVE   RBC / HPF 6-30 0 - 5 RBC/hpf   WBC, UA TOO NUMEROUS TO COUNT 0 - 5 WBC/hpf   Bacteria, UA RARE (A) NONE SEEN   Squamous Epithelial / LPF 0-5 (A) NONE SEEN   Mucous PRESENT    Budding Yeast PRESENT    Crystals PRESENT (A) NEGATIVE  Comprehensive metabolic panel  Result Value Ref Range   Sodium 135 135 - 145 mmol/L   Potassium 3.6 3.5 - 5.1 mmol/L   Chloride 96 (L) 101 - 111 mmol/L   CO2 27 22 - 32 mmol/L   Glucose, Bld 117 (H) 65 - 99 mg/dL   BUN 13 6 - 20 mg/dL   Creatinine, Ser 6.21 0.44 - 1.00 mg/dL   Calcium 9.4 8.9 - 10.3  mg/dL   Total Protein 8.3 (H) 6.5 - 8.1 g/dL   Albumin 3.7 3.5 - 5.0 g/dL   AST 21 15 - 41 U/L   ALT 17 14 - 54 U/L   Alkaline Phosphatase 80 38 - 126 U/L   Total Bilirubin 0.9 0.3 - 1.2 mg/dL   GFR calc non Af Amer >60 >60 mL/min   GFR calc Af Amer >60 >60 mL/min   Anion gap 12 5 - 15  Troponin I  Result Value Ref Range   Troponin I <0.03 <0.03 ng/mL  Lactic acid, plasma  Result Value Ref Range   Lactic Acid, Venous 1.8 0.5 - 1.9 mmol/L  CBC with Differential  Result Value Ref Range   WBC 11.2 (H) 4.0 - 10.5 K/uL   RBC 5.12 (H) 3.87 - 5.11 MIL/uL   Hemoglobin 13.3 12.0 - 15.0 g/dL   HCT 60.4 54.0 - 98.1 %   MCV 79.3 78.0 - 100.0 fL   MCH 26.0 26.0 - 34.0 pg   MCHC 32.8 30.0 - 36.0 g/dL   RDW 19.1 (H) 47.8 - 29.5 %   Platelets 628 (H) 150 - 400 K/uL   Neutrophils Relative % 78 %   Neutro Abs 8.7 (H) 1.7 - 7.7 K/uL   Lymphocytes Relative 14 %   Lymphs Abs 1.6 0.7 - 4.0 K/uL   Monocytes Relative 7 %   Monocytes Absolute 0.8 0.1 - 1.0 K/uL   Eosinophils Relative 1 %   Eosinophils Absolute 0.1 0.0 - 0.7 K/uL   Basophils Relative 0 %   Basophils Absolute 0.0 0.0 - 0.1 K/uL  CK  Result Value Ref Range   Total CK 95 38 - 234 U/L   Dg Chest 2 View Result Date: 12/12/2016 CLINICAL DATA:  82 year old female fell out of bed this morning. EXAM: CHEST  2 VIEW COMPARISON:  09/30/2016 and earlier. FINDINGS: The patient is mildly rotated to the right. Skin fold artifact suspected in the right upper chest extending obliquely to the left. Stable cardiac size and mediastinal contours. Mildly tortuous thoracic aorta. The lungs are clear. No pneumothorax, pulmonary edema or pleural effusion. Osteopenia. No acute osseous abnormality identified. Negative visible bowel gas pattern. IMPRESSION: No acute cardiopulmonary abnormality or acute traumatic injury identified. Electronically Signed   By: Odessa Fleming M.D.   On: 12/12/2016 09:26   Ct Head Wo Contrast Result Date: 12/12/2016 CLINICAL DATA:   82 year old female slid out of bed. Denies hitting head. Found on floor. Initial encounter. EXAM: CT HEAD WITHOUT CONTRAST TECHNIQUE: Contiguous axial images were obtained from the base of the skull through the vertex without intravenous contrast. COMPARISON:  09/30/2016. FINDINGS: Brain: No intracranial hemorrhage or CT evidence of large acute infarct. Mild chronic microvascular changes. Mild global age related atrophy without hydrocephalus. No intracranial mass lesion noted on this unenhanced exam. Partially empty sella incidentally noted. Vascular: Vascular calcifications. Skull: No skull fracture Sinuses/Orbits: No acute orbital abnormality. Mild mucosal thickening ethmoid sinus air cells and sphenoid sinuses. Other: Mastoid air cells and middle ear cavities are clear. IMPRESSION: No intracranial hemorrhage or CT evidence of large acute infarct. Mild chronic microvascular changes. Mild mucosal thickening ethmoid sinus air cells and sphenoid sinuses. Electronically Signed   By: Lacy Duverney M.D.   On: 12/12/2016 10:04     1030:  Pt unable to stand for VS. No clear UTI on Udip; UC pending. Concern regarding pt's safety at home alone.  ED RN states APS called the ED:  States pt's neighbor  called her yesterday and told her the pt had fallen, pt refused to let anyone call EMS, so pt may have been on the floor since yesterday; APS worker states she will come to the hospital today and speak with pt regarding inability to care for herself at home alone and need for placement in SNF.   1050:  Pt continues with AMS. T/C to [facility]. Rinaldo Ratel, case discussed, including:  HPI, pertinent PM/SHx, VS/PE, dx testing, ED course and treatment:  Agreeable to come to ED for evaluation.   Final Clinical Impressions(s) / ED Diagnoses   Final diagnoses:  None    New Prescriptions New Prescriptions   No medications on file     Samuel Jester, DO 12/13/16 1103

## 2016-12-12 NOTE — ED Notes (Signed)
Patient unable to stand  

## 2016-12-12 NOTE — H&P (Signed)
History and Physical    [name] JXB:[id number] DOB: 09/15/1934 DOA: 12/12/2016  PCP: [name], MD   Patient coming from: Home  Chief Complaint: Fall at home  HPI: Anna Hanna is a 82 y.o. female with medical history significant of recurrent falls, HTN, s/p right knee replacement that was brought to the emergency room by EMT after being found on the floor covered with feces.  Per EMS report patient's neighbor called adult protective services yesterday to report that patient was on the floor but refusing help.  EMS was able to get into patient's apartment today and found her covered in feces and had pulled out her foley catheter.  Patient had a foley catheter placed secondary to significant sacral ulcers.  She has now had three admissions in the past 6 months and was sent to a SNF but she signed herself out.  Per patient she lives in an apartment where cousins are all living around her and coming to check on her.  She says they bring her food as well.  I did call patient's daughter who voices that patient does not have running water in her apartment and she is unable to walk.    ED Course: Patient was brought in via EMS, CT of the head was obtained showing No intracranial hemorrhage or CT evidence of large acute infarct. Mild chronic microvascular changes. Mild mucosal thickening ethmoid sinus air cells and sphenoid sinuses.  EKG showing sinus rhythmn and was essentially unchanged from previous EKG.  Labs were essentially unremarkable except for an elevated platelet count of 628.  CK was 95.    Review of Systems: As per HPI otherwise 10 point review of systems negative.    Past Medical History:  Diagnosis Date  . Glaucoma   . Hypercholesteremia   . Hypertension   . Hypokalemia   . Status post total right knee replacement     Past Surgical History:  Procedure Laterality Date  . ABDOMINAL HYSTERECTOMY    . CATARACT EXTRACTION    . COLONOSCOPY N/A 12/10/2013   Procedure:  COLONOSCOPY;  Surgeon: West Bali, MD;  Location: [facility];  Service: Endoscopy;  Laterality: N/A;  8:30 AM  . TOTAL KNEE ARTHROPLASTY Right 03/30/2016   Procedure: TOTAL KNEE ARTHROPLASTY;  Surgeon: Vickki Hearing, MD;  Location: [facility];  Service: Orthopedics;  Laterality: Right;     reports that she has been smoking Cigarettes.  She has a 0.75 pack-year smoking history. She has never used smokeless tobacco. She reports that she does not drink alcohol or use drugs.  No Known Allergies  Family History  Problem Relation Age of Onset  . Cancer Mother   . Arthritis Mother   . Hypotension Father   . Cancer Sister   . Hypotension Sister   . Colon cancer Neg Hx      Prior to Admission medications   Medication Sig Start Date End Date Taking? Authorizing Provider  acetaminophen (TYLENOL) 325 MG tablet Take 650 mg by mouth 3 (three) times daily.    Yes [provider]  Amino Acids-Protein Hydrolys (FEEDING SUPPLEMENT, PRO-STAT SUGAR FREE 64,) LIQD Take 30 mLs by mouth 2 (two) times daily.   Yes [provider]  amLODipine (NORVASC) 5 MG tablet Take 1 tablet by mouth Daily. 05/24/12  Yes [provider]  calcium citrate (CALCITRATE - DOSED IN MG ELEMENTAL CALCIUM) 950 MG tablet Take 200 mg of elemental calcium by mouth daily.   Yes [provider]  latanoprost (XALATAN) 0.005 % ophthalmic solution Place 1 drop into both eyes at bedtime.   Yes [provider]  Menthol, Topical Analgesic, (BIOFREEZE) 4 % GEL Apply to right knee every 8 hours.   Yes [provider]  Potassium Chloride ER 20 MEQ TBCR Take 20 mEq by mouth daily at 6 (six) AM.   Yes [provider]  cefUROXime (CEFTIN) 500 MG tablet Take 1 tablet (500 mg total) by mouth 2 (two) times daily with a meal. Patient not taking: Reported on 12/12/2016 12/04/16   Standley Brooking, MD  iron polysaccharides (NIFEREX) 150 MG capsule Take 150 mg by mouth daily.    [provider]    Physical Exam: Vitals:   12/12/16 1015 12/12/16 1030 12/12/16 1130 12/12/16 1244  BP:  139/79 (!) 145/70 127/73  Pulse: 96   89  Resp: 17 18 (!) 22 19  Temp:    98.6 F (37 C)  TempSrc:    Axillary  SpO2: 100%   97%  Weight:    67.2 kg (148 lb 2.4 oz)  Height:          Constitutional: NAD, calm, comfortable Vitals:   12/12/16 1015 12/12/16 1030 12/12/16 1130 12/12/16 1244  BP:  139/79 (!) 145/70 127/73  Pulse: 96   89  Resp: 17 18 (!) 22 19  Temp:    98.6 F (37 C)  TempSrc:    Axillary  SpO2: 100%   97%  Weight:    67.2 kg (148 lb 2.4 oz)  Height:       Eyes: PERRL, lids and conjunctivae normal ENMT: Mucous membranes are moist. Posterior pharynx clear of any exudate or lesions.Normal dentition.  Neck: normal, supple, no masses, no thyromegaly Respiratory: clear to auscultation bilaterally, no wheezing, no crackles. Normal respiratory effort. No accessory muscle use.  Cardiovascular: Regular rate and rhythm, no murmurs / rubs / gallops. No extremity edema. 2+ pedal pulses. No carotid bruits.  Abdomen: no tenderness, no masses palpated. No hepatosplenomegaly. Bowel sounds positive.  Musculoskeletal: no clubbing / cyanosis. Bilateral lower extremities bent and painful to extend per patient.  Skin: numerous small unstageable ulcers on buttocks bilaterally, dried feces under nails bilaterally Neurologic: able to follow commands, poor abstract thinking, able to move arms bilaterally, legs somewhat contracted but patient states that is normal for her  Psychiatric: Minimal insight, oriented to person, situation (although this is questionable because I do not know if she slide off the bed as she claims), but not oriented to time.  No abstract thinking, unable to repeat "no ifs, ands, or buts" could not explain "you can't judge a book by it's cover" or "those in glass houses shouldn't throw stones".     Labs on Admission: I have personally reviewed following labs  and imaging studies  CBC:  Recent Labs Lab 12/12/16 0848  WBC 11.2*  NEUTROABS 8.7*  HGB 13.3  HCT 40.6  MCV 79.3  PLT 628*   Basic Metabolic Panel:  Recent Labs Lab 12/12/16 0848  NA 135  K 3.6  CL 96*  CO2 27  GLUCOSE 117*  BUN 13  CREATININE 0.54  CALCIUM 9.4   GFR: Estimated Creatinine Clearance: 50.5 mL/min (by C-G formula based on SCr of 0.54 mg/dL). Liver Function Tests:  Recent Labs Lab 12/12/16 0848  AST 21  ALT 17  ALKPHOS 80  BILITOT 0.9  PROT 8.3*  ALBUMIN 3.7   No results for input(s): LIPASE, AMYLASE in the last  168 hours. No results for input(s): AMMONIA in the last 168 hours. Coagulation Profile: No results for input(s): INR, PROTIME in the last 168 hours. Cardiac Enzymes:  Recent Labs Lab 12/12/16 0848 12/12/16 1510  CKTOTAL 95  --   TROPONINI <0.03 <0.03   BNP (last 3 results) No results for input(s): PROBNP in the last 8760 hours. HbA1C: No results for input(s): HGBA1C in the last 72 hours. CBG: No results for input(s): GLUCAP in the last 168 hours. Lipid Profile: No results for input(s): CHOL, HDL, LDLCALC, TRIG, CHOLHDL, LDLDIRECT in the last 72 hours. Thyroid Function Tests: No results for input(s): TSH, T4TOTAL, FREET4, T3FREE, THYROIDAB in the last 72 hours. Anemia Panel: No results for input(s): VITAMINB12, FOLATE, FERRITIN, TIBC, IRON, RETICCTPCT in the last 72 hours. Urine analysis:    Component Value Date/Time   COLORURINE YELLOW 12/12/2016 0848   APPEARANCEUR HAZY (A) 12/12/2016 0848   LABSPEC 1.016 12/12/2016 0848   PHURINE 7.0 12/12/2016 0848   GLUCOSEU NEGATIVE 12/12/2016 0848   HGBUR SMALL (A) 12/12/2016 0848   BILIRUBINUR NEGATIVE 12/12/2016 0848   KETONESUR 5 (A) 12/12/2016 0848   PROTEINUR 30 (A) 12/12/2016 0848   NITRITE NEGATIVE 12/12/2016 0848   LEUKOCYTESUR LARGE (A) 12/12/2016 0848   Sepsis Labs: !!!!!!!!!!!!!!!!!!!!!!!!!!!!!!!!!!!!!!!!!!!! @LABRCNTIP (procalcitonin:4,lacticidven:4) )No results  found for this or any previous visit (from the past 240 hour(s)).   Radiological Exams on Admission: Dg Chest 2 View  Result Date: 12/12/2016 CLINICAL DATA:  82 year old female fell out of bed this morning. EXAM: CHEST  2 VIEW COMPARISON:  09/30/2016 and earlier. FINDINGS: The patient is mildly rotated to the right. Skin fold artifact suspected in the right upper chest extending obliquely to the left. Stable cardiac size and mediastinal contours. Mildly tortuous thoracic aorta. The lungs are clear. No pneumothorax, pulmonary edema or pleural effusion. Osteopenia. No acute osseous abnormality identified. Negative visible bowel gas pattern. IMPRESSION: No acute cardiopulmonary abnormality or acute traumatic injury identified. Electronically Signed   By: Odessa FlemingH  Hall M.D.   On: 12/12/2016 09:26   Ct Head Wo Contrast  Result Date: 12/12/2016 CLINICAL DATA:  82 year old female slid out of bed. Denies hitting head. Found on floor. Initial encounter. EXAM: CT HEAD WITHOUT CONTRAST TECHNIQUE: Contiguous axial images were obtained from the base of the skull through the vertex without intravenous contrast. COMPARISON:  09/30/2016. FINDINGS: Brain: No intracranial hemorrhage or CT evidence of large acute infarct. Mild chronic microvascular changes. Mild global age related atrophy without hydrocephalus. No intracranial mass lesion noted on this unenhanced exam. Partially empty sella incidentally noted. Vascular: Vascular calcifications. Skull: No skull fracture Sinuses/Orbits: No acute orbital abnormality. Mild mucosal thickening ethmoid sinus air cells and sphenoid sinuses. Other: Mastoid air cells and middle ear cavities are clear. IMPRESSION: No intracranial hemorrhage or CT evidence of large acute infarct. Mild chronic microvascular changes. Mild mucosal thickening ethmoid sinus air cells and sphenoid sinuses. Electronically Signed   By: Lacy DuverneySteven  Olson M.D.   On: 12/12/2016 10:04    EKG: Independently reviewed. Sinus  rhythm  Assessment/Plan Principal Problem:   Fall Active Problems:   Living accommodation issues   Altered mental status    Fall - CT of head negative - patient does not endorse any other pain - unclear what happened at patient's apartment  Living accommodation issues - adult protective services involved - per patient's daughter no on checks on her and patient does not have running water - found covered in feces - would benefit from SNF placement - CSW consulted  Altered mental status - slightly disoriented but wonder if this is chronic? - seems to have underlying dementia - will draw TSH, RPR and Vitamin b12 level - may need to question competency here if patient can truly understand taking care of herself and the dangers of going home.  She repeatedly stated she has cousins living close by that check in on her and prepare her meals but her daughter states this is not true.  Unclear if patient believes this to be true or if she is fabricating    DVT prophylaxis: lovenox  Code Status: Full Code  Family Communication: Called Debra- patient's daughter  Disposition Plan: unclear- benefit from SNF placement  Consults called: None Admission status: Observation, telemetry    Katrinka Blazing MD [facility] Pager [phone]  If 7PM-7AM, please contact night-coverage [email or web address] Password [email or web address]  12/12/2016, 5:30 PM

## 2016-12-13 ENCOUNTER — Observation Stay (HOSPITAL_COMMUNITY): Payer: Medicare HMO

## 2016-12-13 DIAGNOSIS — N390 Urinary tract infection, site not specified: Secondary | ICD-10-CM | POA: Diagnosis not present

## 2016-12-13 DIAGNOSIS — R4182 Altered mental status, unspecified: Secondary | ICD-10-CM | POA: Diagnosis not present

## 2016-12-13 DIAGNOSIS — T83511A Infection and inflammatory reaction due to indwelling urethral catheter, initial encounter: Secondary | ICD-10-CM

## 2016-12-13 DIAGNOSIS — A419 Sepsis, unspecified organism: Secondary | ICD-10-CM

## 2016-12-13 DIAGNOSIS — T83511D Infection and inflammatory reaction due to indwelling urethral catheter, subsequent encounter: Secondary | ICD-10-CM

## 2016-12-13 DIAGNOSIS — W19XXXD Unspecified fall, subsequent encounter: Secondary | ICD-10-CM

## 2016-12-13 DIAGNOSIS — W19XXXA Unspecified fall, initial encounter: Secondary | ICD-10-CM

## 2016-12-13 DIAGNOSIS — L89152 Pressure ulcer of sacral region, stage 2: Secondary | ICD-10-CM

## 2016-12-13 DIAGNOSIS — L89109 Pressure ulcer of unspecified part of back, unspecified stage: Secondary | ICD-10-CM

## 2016-12-13 DIAGNOSIS — A4181 Sepsis due to Enterococcus: Secondary | ICD-10-CM

## 2016-12-13 DIAGNOSIS — L8992 Pressure ulcer of unspecified site, stage 2: Secondary | ICD-10-CM

## 2016-12-13 DIAGNOSIS — L8990 Pressure ulcer of unspecified site, unspecified stage: Secondary | ICD-10-CM | POA: Insufficient documentation

## 2016-12-13 DIAGNOSIS — G9341 Metabolic encephalopathy: Secondary | ICD-10-CM | POA: Diagnosis not present

## 2016-12-13 DIAGNOSIS — Z5189 Encounter for other specified aftercare: Secondary | ICD-10-CM

## 2016-12-13 LAB — BASIC METABOLIC PANEL
Anion gap: 9 (ref 5–15)
BUN: 16 mg/dL (ref 6–20)
CALCIUM: 8.5 mg/dL — AB (ref 8.9–10.3)
CO2: 24 mmol/L (ref 22–32)
CREATININE: 0.55 mg/dL (ref 0.44–1.00)
Chloride: 98 mmol/L — ABNORMAL LOW (ref 101–111)
GLUCOSE: 95 mg/dL (ref 65–99)
Potassium: 3 mmol/L — ABNORMAL LOW (ref 3.5–5.1)
SODIUM: 131 mmol/L — AB (ref 135–145)

## 2016-12-13 LAB — VITAMIN B12: Vitamin B-12: 464 pg/mL (ref 180–914)

## 2016-12-13 LAB — TSH: TSH: 1.647 u[IU]/mL (ref 0.350–4.500)

## 2016-12-13 LAB — MRSA PCR SCREENING: MRSA by PCR: POSITIVE — AB

## 2016-12-13 LAB — TROPONIN I: Troponin I: <0.03 ng/mL (ref <0.03)

## 2016-12-13 IMAGING — DX DG TIBIA/FIBULA 2V*R*
4 series · 5 of 5 positions shown · non-contrast
Comparison: 05/05/2016, 04/02/2016

CLINICAL DATA: Right knee pain

EXAM:
RIGHT TIBIA AND FIBULA - 2 VIEW

[tibia ap (1 of 2)]
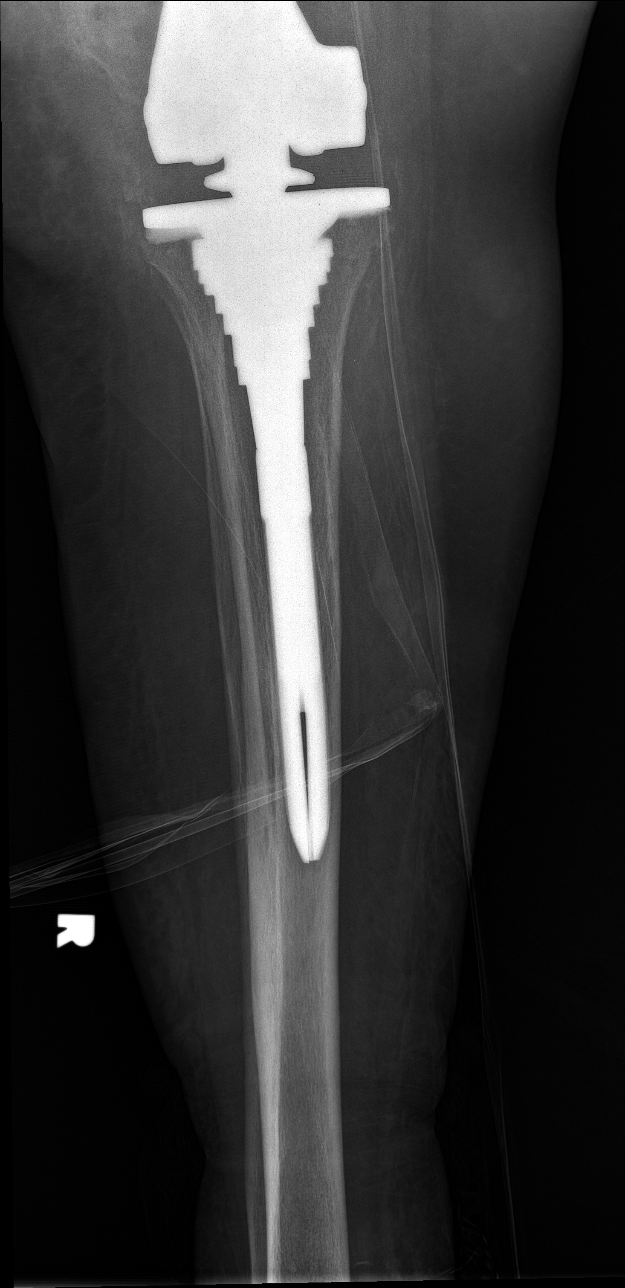

[Series 2: tibia ap · 0.14mm/px · 2 of 2 slices shown (2 of 2)]
[im 1/2]
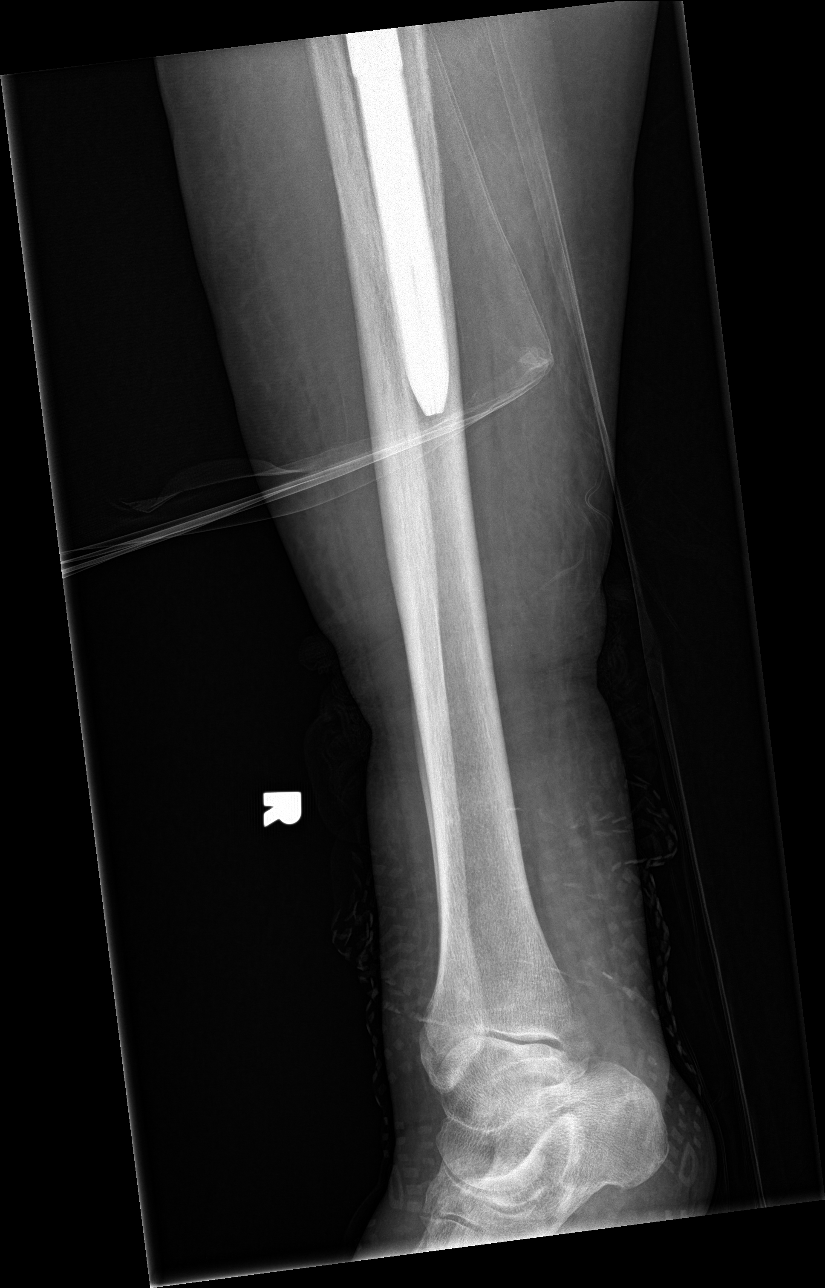
[im 2/2]
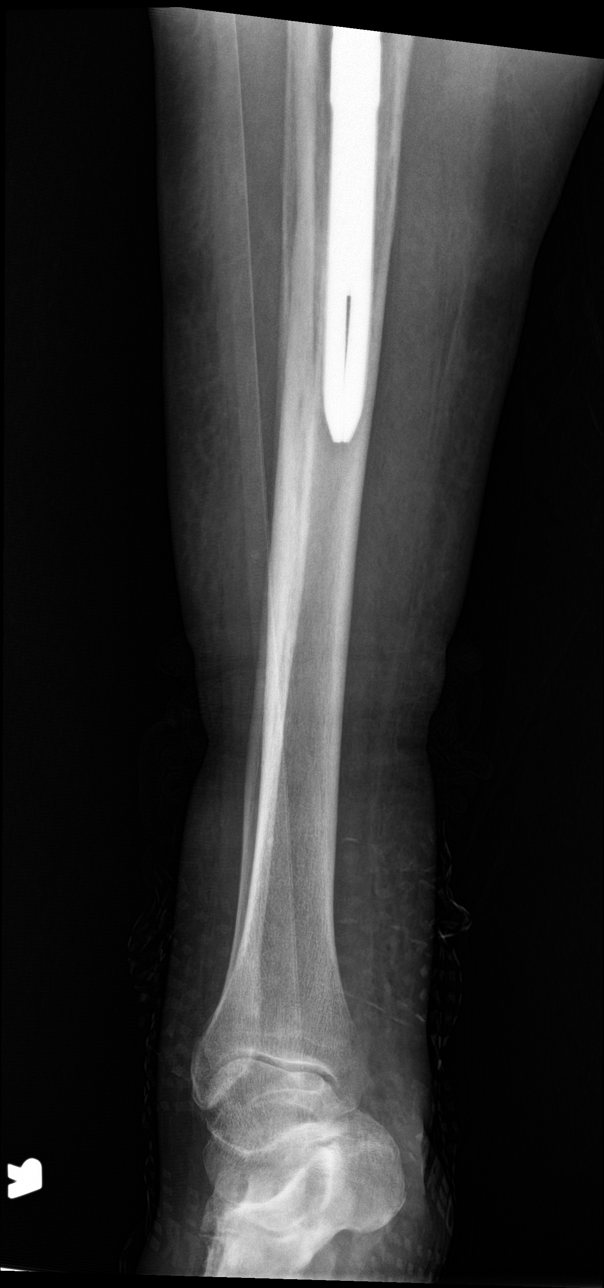

[tibia lat (1 of 2)]
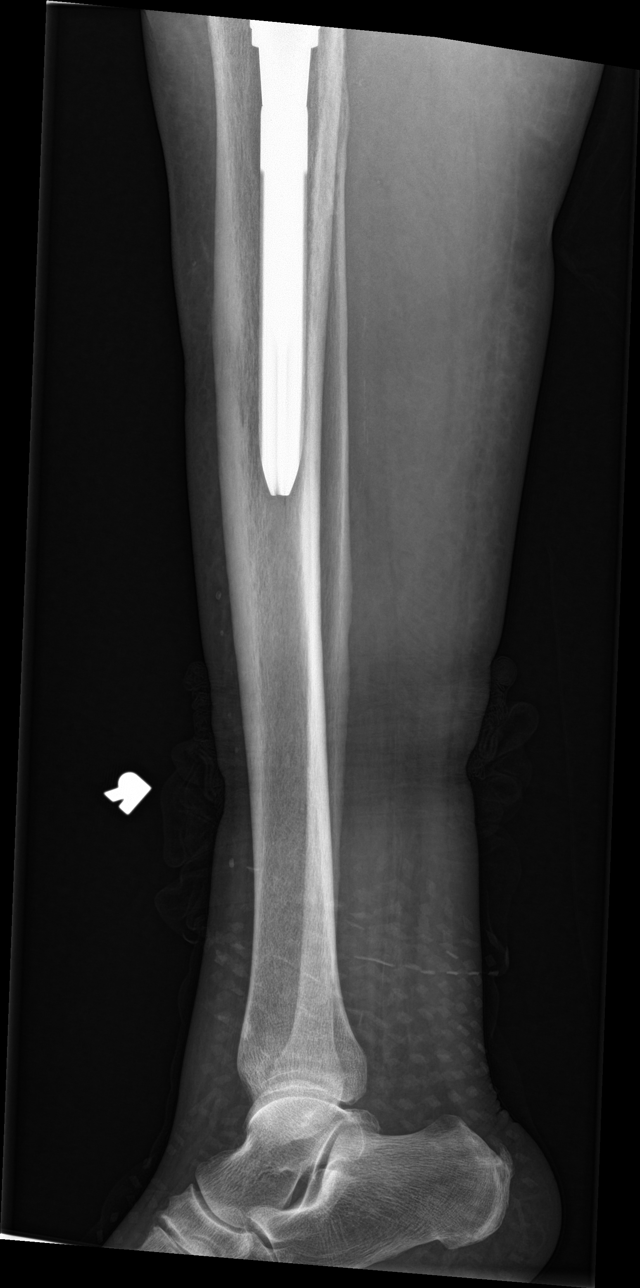

[tibia lat (2 of 2)]
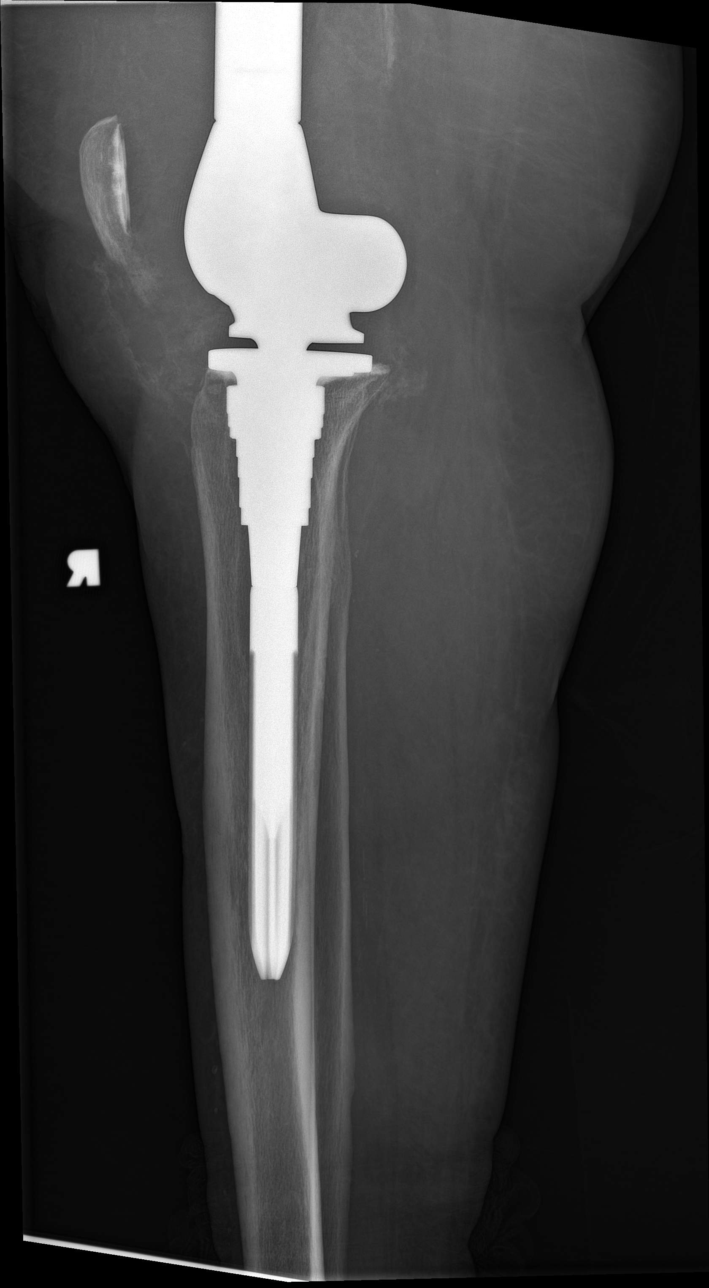

[5 of 5 positions shown; findings below may reference images not displayed]

FINDINGS: The patient is status post right total knee arthroplasty. No acute
fracture or malalignment. No evidence for fracture involving distal
tibia or fibula. Tibial hardware appears intact.
IMPRESSION: Total right knee replacement without acute osseous abnormality.

## 2016-12-13 IMAGING — DX DG FEMUR 1V*R*
3 series · 3 of 3 positions shown · non-contrast
Comparison: 05/05/2016, 04/02/2016

CLINICAL DATA: Right knee pain, history of surgery with fall

EXAM:
RIGHT FEMUR 1 VIEW

[femur ap (1 of 3)]
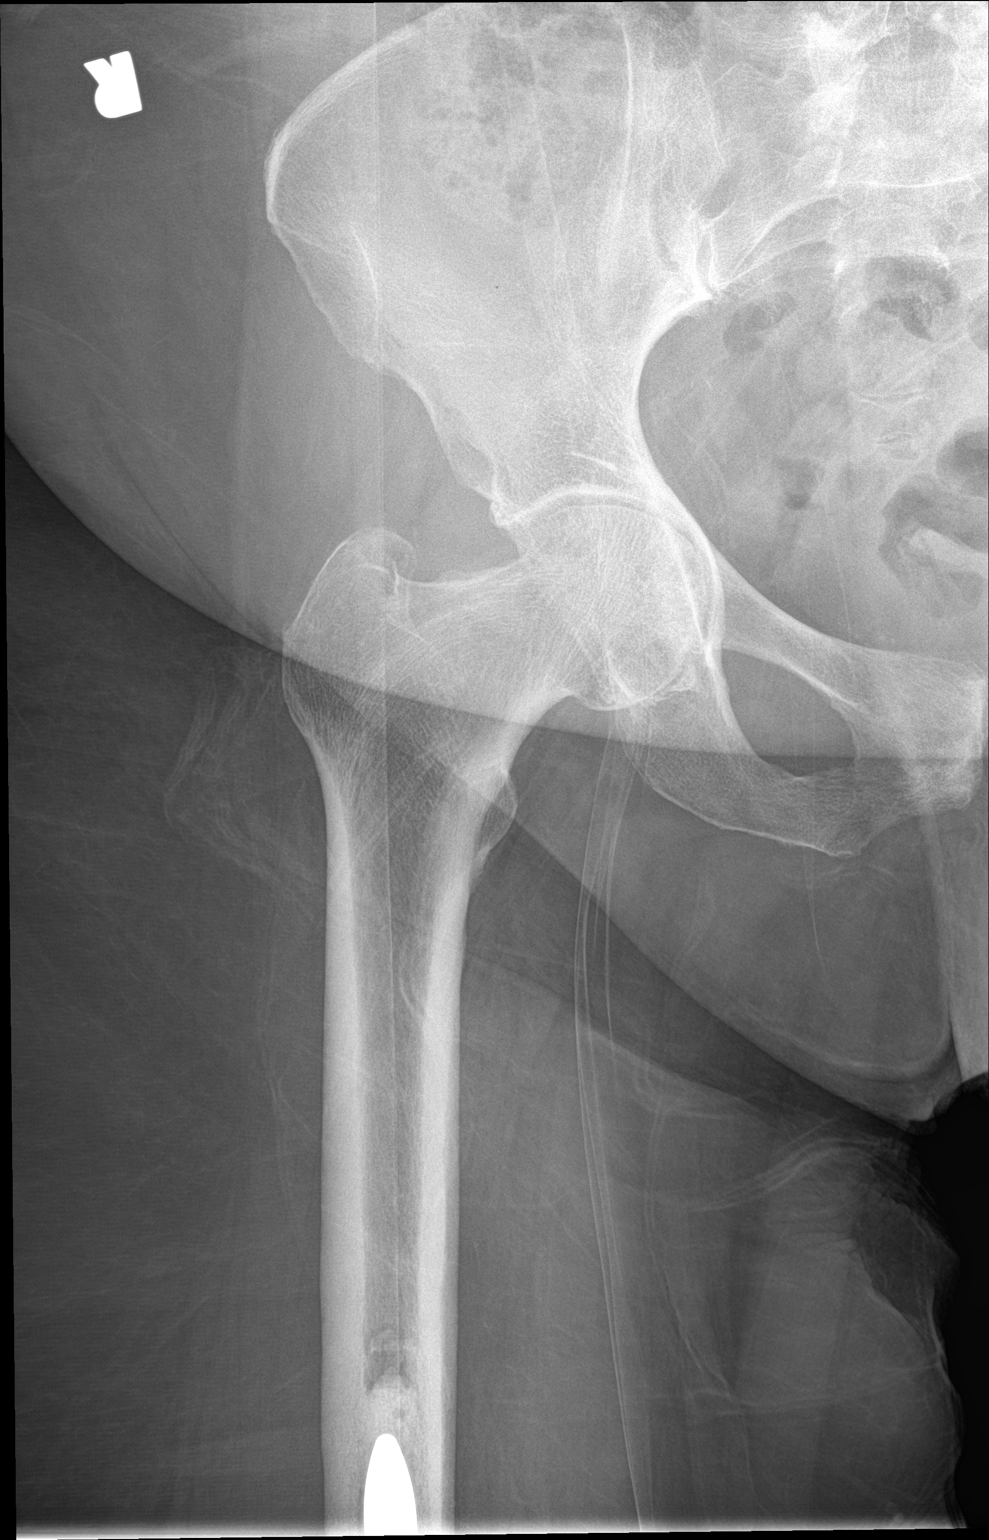

[femur ap (2 of 3)]
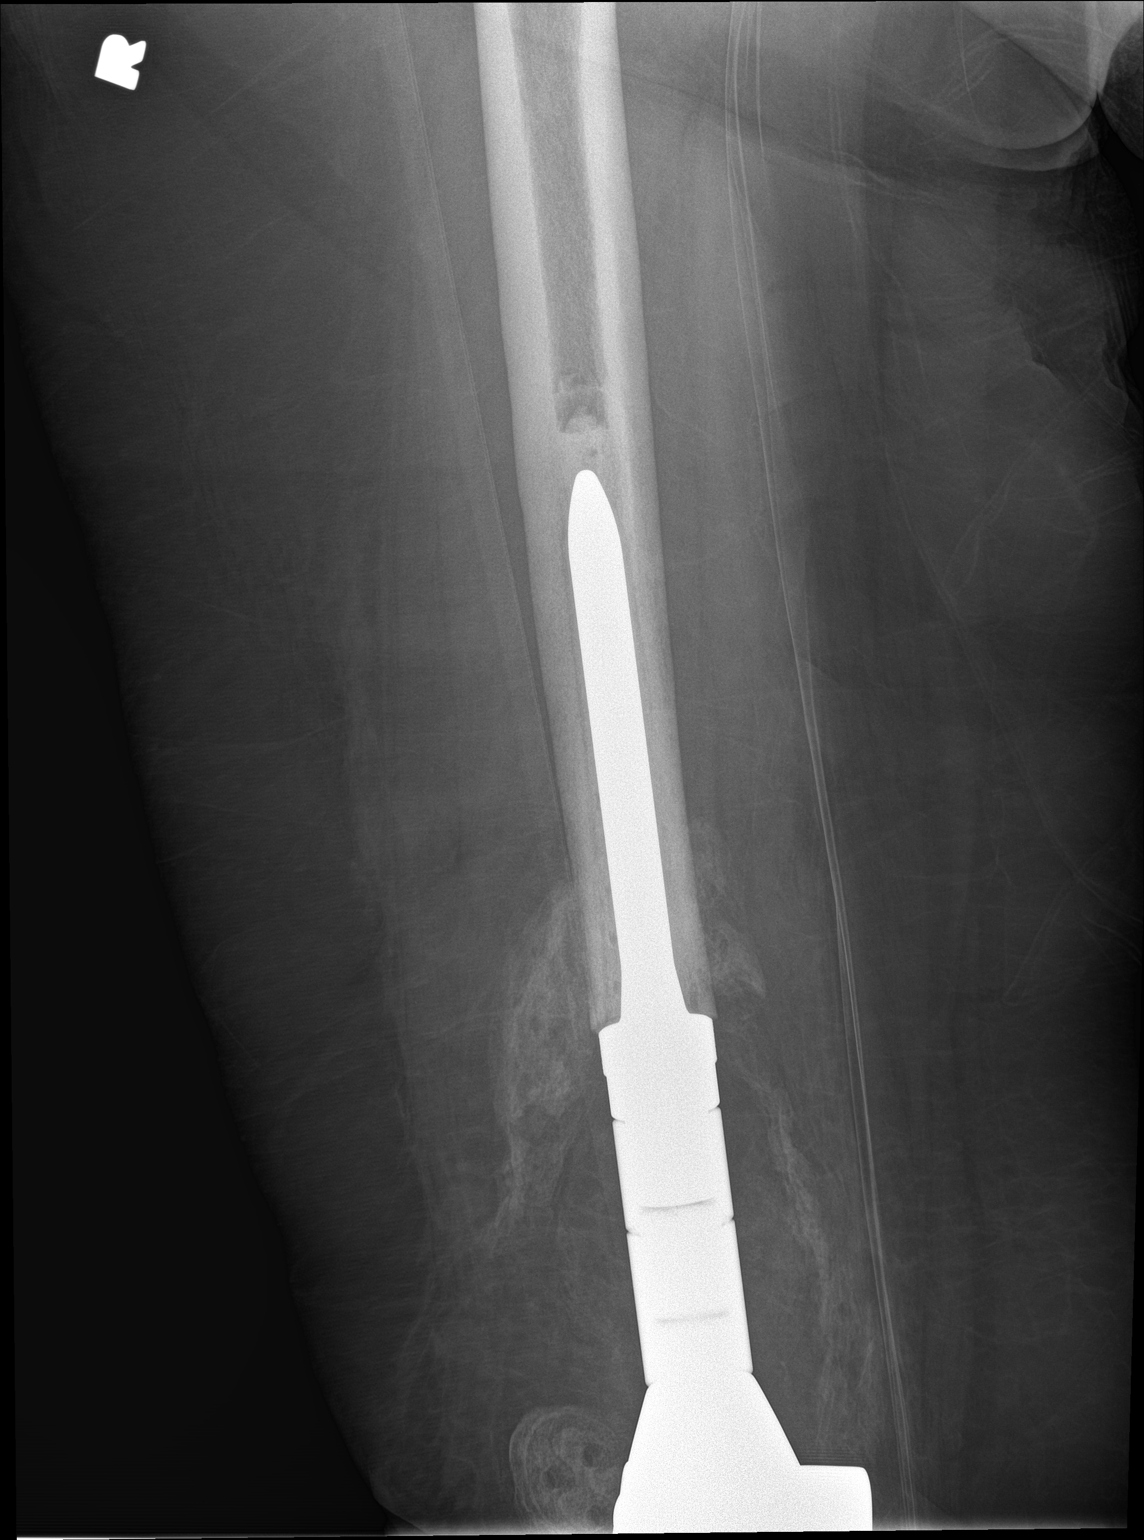

[femur ap (3 of 3)]
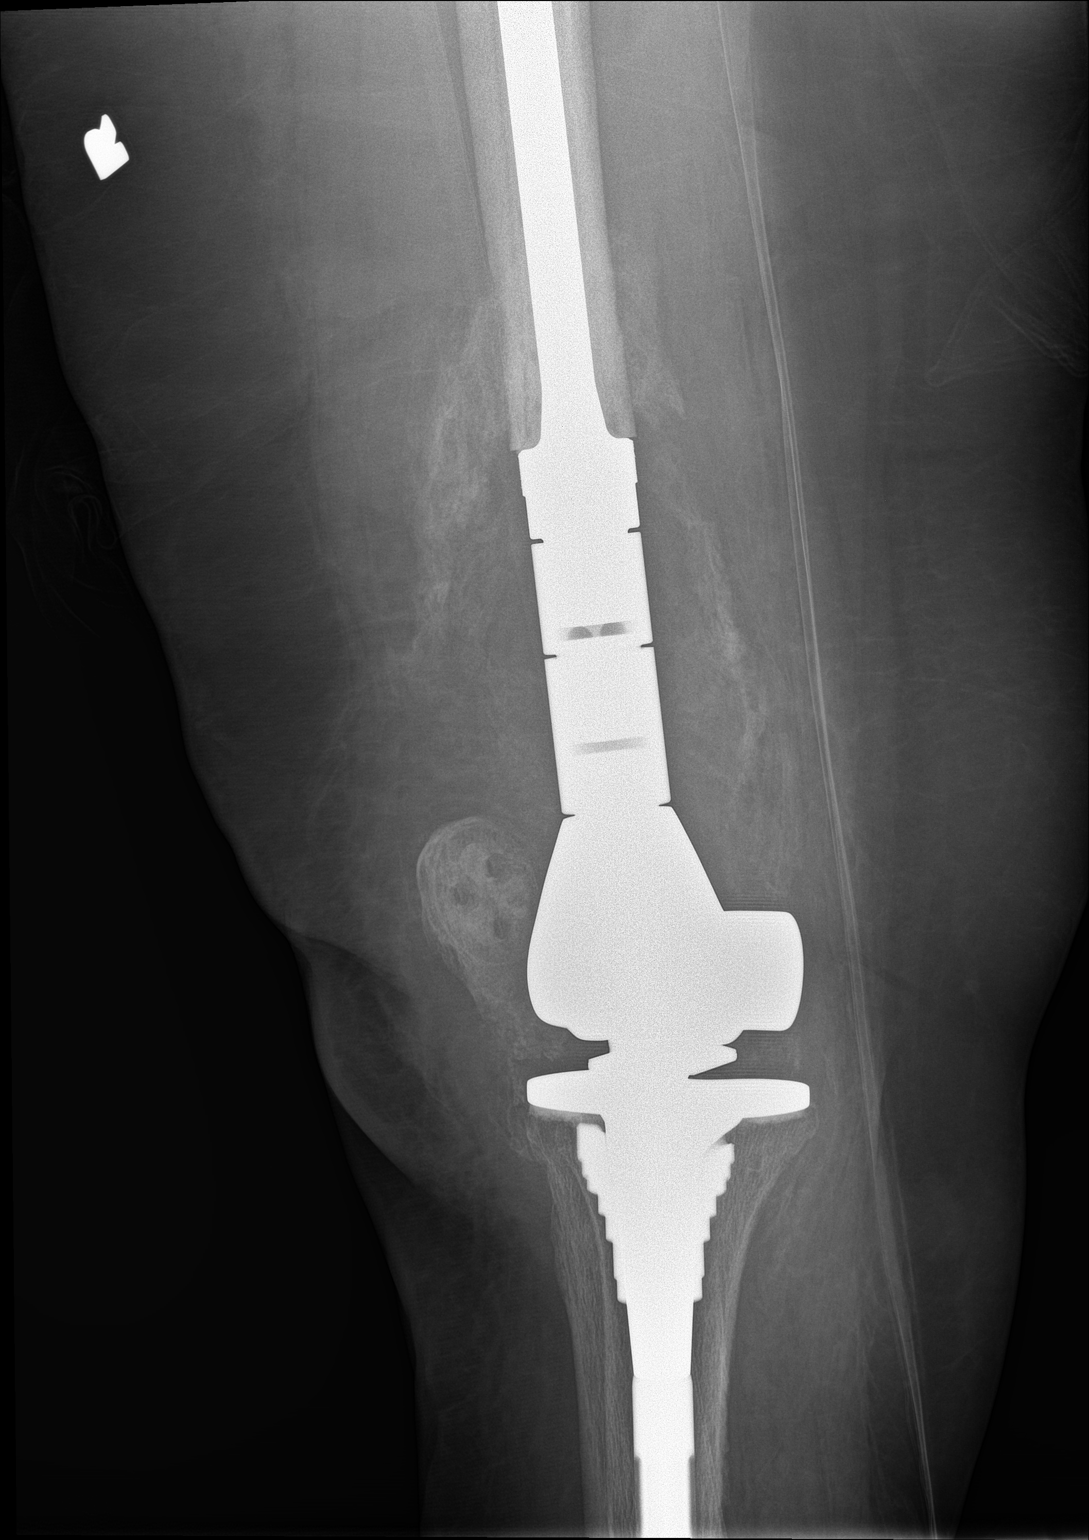

[3 of 3 positions shown; findings below may reference images not displayed]

FINDINGS: AP view of the right femur. The right femoral head is normally
positioned. The patient is status post total right knee replacement.
Hardware appears intact. No fracture identified.
IMPRESSION: Status post right knee replacement. No fracture visualized within
the proximal right femur.

## 2016-12-13 MED ORDER — SODIUM CHLORIDE 0.9 % IV SOLN: INTRAVENOUS | Status: DC

## 2016-12-13 MED ORDER — POTASSIUM CHLORIDE CRYS ER 20 MEQ PO TBCR
40.0000 meq | EXTENDED_RELEASE_TABLET | Freq: Once | ORAL | Status: AC
  Administered 2016-12-13: 40 meq via ORAL
  Filled 2016-12-13: qty 2

## 2016-12-13 MED ORDER — ZINC OXIDE 20 % EX OINT
TOPICAL_OINTMENT | Freq: Two times a day (BID) | CUTANEOUS | Status: DC
  Administered 2016-12-13 – 2016-12-14 (×3): via TOPICAL
  Filled 2016-12-13: qty 28.35

## 2016-12-13 MED ORDER — CHLORHEXIDINE GLUCONATE CLOTH 2 % EX PADS
6.0000 | MEDICATED_PAD | Freq: Every day | CUTANEOUS | Status: DC
  Administered 2016-12-13 – 2016-12-14 (×2): 6 via TOPICAL

## 2016-12-13 MED ORDER — POTASSIUM CHLORIDE IN NACL 40-0.9 MEQ/L-% IV SOLN
INTRAVENOUS | Status: AC
  Administered 2016-12-13 (×2): 75 mL/h via INTRAVENOUS

## 2016-12-13 MED ORDER — DEXTROSE 5 % IV SOLN
1.0000 g | INTRAVENOUS | Status: DC
  Administered 2016-12-13: 1 g via INTRAVENOUS
  Filled 2016-12-13 (×2): qty 10

## 2016-12-13 MED ORDER — MUPIROCIN 2 % EX OINT
1.0000 "application " | TOPICAL_OINTMENT | Freq: Two times a day (BID) | CUTANEOUS | Status: DC
  Administered 2016-12-13 – 2016-12-14 (×3): 1 via NASAL
  Filled 2016-12-13: qty 22

## 2016-12-13 NOTE — Care Management Obs Status (Signed)
MEDICARE OBSERVATION STATUS NOTIFICATION   Patient Details  Name: Anna Hanna MRN: [id number] Date of Birth: 12-25-1934   Medicare Observation Status Notification Given:  Yes    Christoph Copelan, Chrystine OilerSharley Diane, RN 12/13/2016, 11:25 AM

## 2016-12-13 NOTE — Progress Notes (Signed)
PROGRESS NOTE  Anna Hanna NWG:[id number] DOB: [birth year] DOA: 12/12/2016 PCP: Gareth MorganKnowlton, Steve, MD  Brief History:  82 year old female with a history of recurrent falls, hypertension, right total knee arthroplasty and iron deficiency anemia presented when she was found by her neighbor covered in feces and urine. The patient states that she tripped and fell and was not able to get up. She scored poorly on her own Foley catheter with which she was discharged from the hospital on 12/04/2016. The patient was discharged to a skilled nursing facility after her admission from 12/01/2016 through 12/04/2016 for which she was treated for UTI. The patient signed out AGAINST MEDICAL ADVICE from the skilled nursing facility. The patient gives a slightly different retelling of historical events with each provider. She states that she is normally able to ambulate with a walker. Patient denies fevers, chills, headache, chest pain, dyspnea, nausea, vomiting, diarrhea, abdominal pain, dysuria, hematuria, hematochezia, and melena. Upon presentation, the patient was noted to be tachycardic with evidence of UTI and elevated lactic acid of 2.0. The was fluid resuscitated and admitted for further evaluation.  Assessment/Plan: Sepsis -presented with tachycardia, lactic acidosis and evidence of UTI with confusion -continue IVF -abx pending culture data  Catheter associated UTI -Catheter was present prior to admission -Patient has Foley catheter secondary to gluteal decubiti -Ceftriaxone pending culture data  Acute metabolic encephalopathy -Secondary to UTI and dehydration -Appears to be improving -TSH, RPR, serum B12  Lactic acidosis -presented with lactic acid 2.0>>>1.0 -continue IVF  Gluteal pressure injury--stage 2 -wound care consult -not infected on exam  Deconditioning -PT eval  Right knee pain -xray right knee  Hypokalemia -replete -check mag -add KCl to  IVF       Disposition Plan:   Refuses SNF; home 1-2 days Family Communication:  No Family at bedside--Total time spent 35 minutes.  Greater than 50% spent face to face counseling and coordinating care.   Consultants:  none  Code Status:  FULL  DVT Prophylaxis:   [place] Lovenox   Procedures: As Listed in Progress Note Above  Antibiotics: Ceftriaxone 5/23>>>    Subjective: Patient denies fevers, chills, headache, chest pain, dyspnea, nausea, vomiting, diarrhea, abdominal pain, dysuria, hematuria, hematochezia, and melena. C/o right knee pain   Objective: Vitals:   12/12/16 1244 12/12/16 2030 12/12/16 2048 12/13/16 0522  BP: 127/73  (!) 118/58 126/81  Pulse: 89  (!) 110 (!) 107  Resp: 19  18 20   Temp: 98.6 F (37 C)  98 F (36.7 C) 99.2 F (37.3 C)  TempSrc: Axillary  Oral Oral  SpO2: 97% 94% 96% 100%  Weight: 67.2 kg (148 lb 2.4 oz)     Height:        Intake/Output Summary (Last 24 hours) at 12/13/16 0946 Last data filed at 12/13/16 0700  Gross per 24 hour  Intake          2329.25 ml  Output              300 ml  Net          2029.25 ml   Weight change:  Exam:   General:  Pt is alert, follows commands appropriately, not in acute distress  HEENT: No icterus, No thrush, No neck mass, [place]/AT  Cardiovascular: RRR, S1/S2, no rubs, no gallops  Respiratory: CTA bilaterally, no wheezing, no crackles, no rhonchi  Abdomen: Soft/+BS, non tender, non distended, no guarding  Extremities: No edema, No lymphangitis,  No petechiae, No rashes, no synovitis   Data Reviewed: I have personally reviewed following labs and imaging studies Basic Metabolic Panel:  Recent Labs Lab 12/12/16 0848 12/13/16 0255  NA 135 131*  K 3.6 3.0*  CL 96* 98*  CO2 27 24  GLUCOSE 117* 95  BUN 13 16  CREATININE 0.54 0.55  CALCIUM 9.4 8.5*   Liver Function Tests:  Recent Labs Lab 12/12/16 0848  AST 21  ALT 17  ALKPHOS 80  BILITOT 0.9  PROT 8.3*  ALBUMIN 3.7   No results  for input(s): LIPASE, AMYLASE in the last 168 hours. No results for input(s): AMMONIA in the last 168 hours. Coagulation Profile: No results for input(s): INR, PROTIME in the last 168 hours. CBC:  Recent Labs Lab 12/12/16 0848  WBC 11.2*  NEUTROABS 8.7*  HGB 13.3  HCT 40.6  MCV 79.3  PLT 628*   Cardiac Enzymes:  Recent Labs Lab 12/12/16 0848 12/12/16 1510 12/12/16 2041 12/13/16 0255  CKTOTAL 95  --   --   --   TROPONINI <0.03 <0.03 <0.03 <0.03   BNP: Invalid input(s): POCBNP CBG: No results for input(s): GLUCAP in the last 168 hours. HbA1C: No results for input(s): HGBA1C in the last 72 hours. Urine analysis:    Component Value Date/Time   COLORURINE YELLOW 12/12/2016 0848   APPEARANCEUR HAZY (A) 12/12/2016 0848   LABSPEC 1.016 12/12/2016 0848   PHURINE 7.0 12/12/2016 0848   GLUCOSEU NEGATIVE 12/12/2016 0848   HGBUR SMALL (A) 12/12/2016 0848   BILIRUBINUR NEGATIVE 12/12/2016 0848   KETONESUR 5 (A) 12/12/2016 0848   PROTEINUR 30 (A) 12/12/2016 0848   NITRITE NEGATIVE 12/12/2016 0848   LEUKOCYTESUR LARGE (A) 12/12/2016 0848   Sepsis Labs: @LABRCNTIP (procalcitonin:4,lacticidven:4) ) Recent Results (from the past 240 hour(s))  Urine culture     Status: Abnormal (Preliminary result)   Collection Time: 12/12/16  8:48 AM  Result Value Ref Range Status   Specimen Description URINE, RANDOM  Final   Special Requests NONE  Final   Culture (A)  Final    >=100,000 COLONIES/mL UNIDENTIFIED ORGANISM IDENTIFICATION AND SUSCEPTIBILITIES TO FOLLOW Performed at [facility], [address] [place], [place] [address]    Report Status PENDING  Incomplete  MRSA PCR Screening     Status: Abnormal   Collection Time: 12/13/16  3:40 AM  Result Value Ref Range Status   MRSA by PCR POSITIVE (A) NEGATIVE Final    Comment:        The GeneXpert MRSA Assay (FDA approved for NASAL specimens only), is one component of a comprehensive MRSA colonization surveillance  program. It is not intended to diagnose MRSA infection nor to guide or monitor treatment for MRSA infections. RESULT CALLED TO, READ BACK BY AND VERIFIED WITH: HOWERTON,J @835  BY MATTHEWS,B 12/13/16      Scheduled Meds: . Chlorhexidine Gluconate Cloth  6 each Topical Q0600  . enoxaparin (LOVENOX) injection  40 mg Subcutaneous Q24H  . mupirocin ointment  1 application Nasal BID   Continuous Infusions: . sodium chloride 75 mL/hr at 12/13/16 0344    Procedures/Studies: Dg Chest 2 View  Result Date: 12/12/2016 CLINICAL DATA:  82 year old female fell out of bed this morning. EXAM: CHEST  2 VIEW COMPARISON:  09/30/2016 and earlier. FINDINGS: The patient is mildly rotated to the right. Skin fold artifact suspected in the right upper chest extending obliquely to the left. Stable cardiac size and mediastinal contours. Mildly tortuous thoracic aorta. The lungs are clear. No  pneumothorax, pulmonary edema or pleural effusion. Osteopenia. No acute osseous abnormality identified. Negative visible bowel gas pattern. IMPRESSION: No acute cardiopulmonary abnormality or acute traumatic injury identified. Electronically Signed   By: Odessa Fleming M.D.   On: 12/12/2016 09:26   Ct Head Wo Contrast  Result Date: 12/12/2016 CLINICAL DATA:  82 year old female slid out of bed. Denies hitting head. Found on floor. Initial encounter. EXAM: CT HEAD WITHOUT CONTRAST TECHNIQUE: Contiguous axial images were obtained from the base of the skull through the vertex without intravenous contrast. COMPARISON:  09/30/2016. FINDINGS: Brain: No intracranial hemorrhage or CT evidence of large acute infarct. Mild chronic microvascular changes. Mild global age related atrophy without hydrocephalus. No intracranial mass lesion noted on this unenhanced exam. Partially empty sella incidentally noted. Vascular: Vascular calcifications. Skull: No skull fracture Sinuses/Orbits: No acute orbital abnormality. Mild mucosal thickening ethmoid sinus  air cells and sphenoid sinuses. Other: Mastoid air cells and middle ear cavities are clear. IMPRESSION: No intracranial hemorrhage or CT evidence of large acute infarct. Mild chronic microvascular changes. Mild mucosal thickening ethmoid sinus air cells and sphenoid sinuses. Electronically Signed   By: Lacy Duverney M.D.   On: 12/12/2016 10:04    Anna Fear, DO  [facility] Pager [phone]  If 7PM-7AM, please contact night-coverage [email or web address] Password [email or web address] 12/13/2016, 9:46 AM   LOS: 0 days

## 2016-12-13 NOTE — [place] FL2 (Signed)
[place] MEDICAID FL2 LEVEL OF CARE SCREENING TOOL     IDENTIFICATION  Patient Name: Anna Hanna Birthdate: 1935-05-15 Sex: female Admission Date (Current Location): 12/12/2016  [place]:  [place] [id number] L Facility and Address:  [facility],  [address], [place] [id number]      Provider Number: [id number]  Attending Physician Name and Address:  Catarina Hartshornat, David, MD  Relative Name and Phone Number:       Current Level of Care: Hospital Recommended Level of Care: Skilled Nursing Facility Prior Approval Number:    Date Approved/Denied:   PASRR Number: [id number]  Discharge Plan: Home    Current Diagnoses: Patient Active Problem List   Diagnosis Date Noted  . Pressure injury of skin 12/13/2016  . Acute metabolic encephalopathy 12/13/2016  . Sepsis due to undetermined organism (HCC) 12/13/2016  . Fall 12/12/2016  . Living accommodation issues 12/12/2016  . Altered mental status 12/12/2016  . Decubitus ulcer of right ischium, unstageable (HCC)   . UTI (urinary tract infection) 12/03/2016  . UTI (urinary tract infection) due to urinary indwelling catheter (HCC) 12/02/2016  . Benign essential HTN 12/02/2016  . Rhabdomyolysis 09/30/2016  . Immobility 09/30/2016  . Hypokalemia 09/30/2016  . Pressure ulcer, stage 2 09/30/2016  . Acute renal failure (HCC) 09/30/2016  . Hypothermia 09/30/2016  . 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 Hyponatremia 04/02/2016  . Hyperglycemia 04/02/2016  . Femur fracture, right (HCC) 04/02/2016  . Osteoarthritis of right knee 03/30/2016  . Arthritis of knee, right     Orientation RESPIRATION BLADDER Height & Weight     Self, Situation, Place, Time  Normal Incontinent Weight: 148 lb 2.4 oz (67.2 kg) Height:  5\' 5"  (165.1 cm)  BEHAVIORAL SYMPTOMS/MOOD NEUROLOGICAL BOWEL NUTRITION STATUS      Incontinent Diet (Heart Healthy)   AMBULATORY STATUS COMMUNICATION OF NEEDS Skin   Extensive Assist Verbally PU Stage and Appropriate Care (buttocks lower lateral)                       Personal Care Assistance Level of Assistance  Bathing, Feeding, Dressing Bathing Assistance: Limited assistance Feeding assistance: Independent Dressing Assistance: Limited assistance     Functional Limitations Info  Sight, Hearing, Speech Sight Info: Adequate Hearing Info: Adequate Speech Info: Adequate    SPECIAL CARE FACTORS FREQUENCY  PT (By licensed PT)     PT Frequency: 5x/week              Contractures Contractures Info: Not present    Additional Factors Info  Code Status, Isolation Precautions Code Status Info: Full code       Isolation Precautions Info: 5.23.2018 MRSA PCR +     Current Medications (12/13/2016):  This is the current hospital active medication list Current Facility-Administered Medications  Medication Dose Route Frequency Provider Last Rate Last Dose  . 0.9 % NaCl with KCl 40 mEq / L  infusion   Intravenous Continuous Tat, David, MD 75 mL/hr at 12/13/16 1057 75 mL/hr at 12/13/16 1057  . acetaminophen (TYLENOL) tablet 650 mg  650 mg Oral Q6H PRN Filbert SchilderKadolph, Alexandria U, MD   650 mg at 12/12/16 2220   Or  . acetaminophen (TYLENOL) suppository 650 mg  650 mg Rectal Q6H PRN Filbert SchilderKadolph, Alexandria U, MD      . cefTRIAXone (ROCEPHIN) 1 g in dextrose 5 % 50 mL IVPB  1 g Intravenous Q24H Catarina Hartshornat, David, MD  100 mL/hr at 12/13/16 1057 1 g at 12/13/16 1057  . Chlorhexidine Gluconate Cloth 2 % PADS 6 each  6 each Topical Q0600 Tat, Onalee Hua, MD   6 each at 12/13/16 1056  . enoxaparin (LOVENOX) injection 40 mg  40 mg Subcutaneous Q24H Debbra Riding U, MD      . magnesium hydroxide (MILK OF MAGNESIA) suspension 30 mL  30 mL Oral Daily PRN Filbert Schilder, MD      . mupirocin ointment (BACTROBAN) 2 % 1 application  1 application Nasal BID Catarina Hartshorn, MD   1 application at 12/13/16 1057     Discharge  Medications: Please see discharge summary for a list of discharge medications.  Relevant Imaging Results:  Relevant Lab Results:   Additional Information SSN: [id number]  Annice Needy, LCSW

## 2016-12-13 NOTE — Evaluation (Signed)
Physical Therapy Evaluation Patient Details Name: Anna Hanna MRN: [id number] DOB: 1935/04/14 Today's Date: 12/13/2016   History of Present Illness  82 y.o. female with medical history significant of recurrent falls, HTN, s/p right knee replacement that was brought to the emergency room by EMT after being found on the floor covered with feces.  Per EMS report patient's neighbor called adult protective services yesterday to report that patient was on the floor but refusing help.  EMS was able to get into patient's apartment today and found her covered in feces and had pulled out her foley catheter.  Patient had a foley catheter placed secondary to significant sacral ulcers.  She has now had three admissions in the past 6 months and was sent to a SNF but she signed herself out.  Per patient she lives in an apartment where cousins are all living around her and coming to check on her.  She says they bring her food as well.  I did call patient's daughter who voices that patient does not have running water in her apartment and she is unable to walk.     Clinical Impression  Pt received in bed, and is agreeable to PT evaluation.  Pt lives alone, and states she uses a RW and w/c to mobilize.  According to the patient, she has a cousin that assists her with dressing and bathing.  She was not oriented to time, and states that it is the third month, and it is 1972.  She is quite easily distracted, and needs commands to be repeated several times.  She required min guard for supine<>sit, and Mod A +2 for sit<>Stand<>chair with RW.  Pt is very fearful of falling, and her mobility is greatly impacted by this.  She is recommended for SNF, and is not safe to d/c home.  She has been recommended for SNF on multiple occasions.      Follow Up Recommendations SNF    Equipment Recommendations  None recommended by PT    Recommendations for Other Services       Precautions / Restrictions Precautions Precautions:  Fall Precaution Comments: Multiple falls in the past 6 months Restrictions Weight Bearing Restrictions: No      Mobility  Bed Mobility Overal bed mobility: Needs Assistance Bed Mobility: Supine to Sit     Supine to sit: Min guard     General bed mobility comments: Increased time required, encouragement and verbal cuing for sequencing/completion  Transfers Overall transfer level: Needs assistance Equipment used: Rolling walker (2 wheeled) Transfers: Sit to/from [employer] Sit to Stand: Mod assist;+2 physical assistance;From elevated surface (Pt requires cues for anterior weight shift to be able to stand up. ) Stand pivot transfers: Mod assist;+2 physical assistance       General transfer comment: very fearful, minimal weight on RLE, requires consistent cuing for holding walker and supporting weight through arms, as well as turning to sit in chair. Fearful of letting walker go to reach back for chair.   Ambulation/Gait Ambulation/Gait assistance:  (NA due to poor ability to transfer.)              Stairs            Wheelchair Mobility    Modified Rankin (Stroke Patients Only)       Balance Overall balance assessment: History of Falls;Needs assistance Sitting-balance support: Bilateral upper extremity supported;Feet supported Sitting balance-Leahy Scale: Good     Standing balance support: Bilateral upper extremity supported Standing balance-Leahy  Scale: Poor                               Pertinent Vitals/Pain Pain Assessment: No/denies pain    Home Living   Living Arrangements: Alone Available Help at Discharge:  (Cousins check in on her daily) Type of Home: Mobile home Home Access: Stairs to enter;Ramped entrance Entrance Stairs-Rails: Can reach both Entrance Stairs-Number of Steps: 2 Home Layout: One level Home Equipment: Walker - 2 wheels;Cane - single point;Bedside commode;Wheelchair - power      Prior  Function Level of Independence: Needs assistance   Gait / Transfers Assistance Needed: Pt reports she uses a walker or wheelchair for mobility. She requires assistance to get in/out of bed at times.  Pt states that's how she got the sores on her buttocks - from sitting in the w/c too long.   ADL's / Homemaking Assistance Needed: Pt states she requires assistance with dressing and bathing        Hand Dominance   Dominant Hand: Right    Extremity/Trunk Assessment   Upper Extremity Assessment Upper Extremity Assessment: Generalized weakness    Lower Extremity Assessment Lower Extremity Assessment: RLE deficits/detail RLE Deficits / Details: Pt demonstrates significant R knee flexion contracture       Communication   Communication: No difficulties  Cognition Arousal/Alertness: Awake/alert Behavior During Therapy: WFL for tasks assessed/performed Overall Cognitive Status: No family/caregiver present to determine baseline cognitive functioning Area of Impairment: Orientation;Following commands;Safety/judgement;Memory                 Orientation Level: Time (states year as 77)   Memory: Decreased recall of precautions;Decreased short-term memory Following Commands: Follows one step commands inconsistently Safety/Judgement: Decreased awareness of safety;Decreased awareness of deficits     General Comments: Pt requires multiple repetition to complete requested task of donning/doffing socks.        General Comments General comments (skin integrity, edema, etc.): Right ischium with full thickness unstageable skin injury due to pressure and moisture    Exercises     Assessment/Plan    PT Assessment Patient needs continued PT services  PT Problem List Decreased strength;Decreased range of motion;Decreased activity tolerance;Decreased balance;Decreased mobility;Decreased cognition;Decreased knowledge of use of DME;Decreased safety awareness;Decreased skin integrity        PT Treatment Interventions Functional mobility training;Therapeutic activities;Therapeutic exercise;Balance training;Patient/family education;DME instruction    PT Goals (Current goals can be found in the Care Plan section)  Acute Rehab PT Goals Patient Stated Goal: Pt does not state Time For Goal Achievement: 12/27/16 Potential to Achieve Goals: Fair    Frequency Min 2X/week   Barriers to discharge Decreased caregiver support      Co-evaluation PT/OT/SLP Co-Evaluation/Treatment: Yes Reason for Co-Treatment: Complexity of the patient's impairments (multi-system involvement);Necessary to address cognition/behavior during functional activity;For patient/therapist safety;To address functional/ADL transfers PT goals addressed during session: Mobility/safety with mobility;Balance;Proper use of DME;Strengthening/ROM OT goals addressed during session: ADL's and self-care;Proper use of Adaptive equipment and DME       AM-PAC PT "6 Clicks" Daily Activity  Outcome Measure Difficulty turning over in bed (including adjusting bedclothes, sheets and blankets)?: A Little Difficulty moving from lying on back to sitting on the side of the bed? : A Little Difficulty sitting down on and standing up from a chair with arms (e.g., wheelchair, bedside commode, etc,.)?: A Lot Help needed moving to and from a bed to chair (including a wheelchair)?: A Lot Help  needed walking in hospital room?: Total Help needed climbing 3-5 steps with a railing? : Total 6 Click Score: 12    End of Session Equipment Utilized During Treatment: Gait belt Activity Tolerance: Patient limited by fatigue Patient left: in chair;with call bell/phone within reach Nurse Communication: Mobility status Chales Abrahams(Mary Ann called and notified of pt's location and to use STEDY for transfer.  mobiltiy sheet left in the room. ) PT Visit Diagnosis: History of falling (Z91.81);Muscle weakness (generalized) (M62.81);Repeated falls (R29.6);Other  abnormalities of gait and mobility (R26.89);Unsteadiness on feet (R26.81)    Time: [id number] PT Time Calculation (min) (ACUTE ONLY): 28 min   Charges:   PT Evaluation $PT Eval Low Complexity: 1 Procedure     PT G Codes:   PT G-Codes **NOT FOR INPATIENT CLASS** Functional Assessment Tool Used: AM-PAC 6 Clicks Basic Mobility;Clinical judgement Mobility: Walking and Moving Around Current Status ([id number](G8978): At least 60 percent but less than 80 percent impaired, limited or restricted Mobility: Walking and Moving Around Goal Status [phone](G8979): At least 40 percent but less than 60 percent impaired, limited or restricted    Beth Mahli Glahn, PT, DPT X: [phone]

## 2016-12-13 NOTE — Care Management Note (Addendum)
Case Management Note  Patient Details  Name: Anna RubensMelba J Hanna MRN: [id number] Date of Birth: September 26, 1934  Subjective/Objective: Patient adm with UTI, fall. From home, recent admission for same.  Denied SNF placement at that time. Patient [facility]. Patient does not have 24 hour supervision and has a DSS social worker involved in her care. Patient appears unsafe at home.                 Action/Plan: Patient agreeable to SNF at this time. CSW making arrangements. CM will follow. PT consult pending.   Expected Discharge Date:       12/14/2016           Expected Discharge Plan:  Skilled Nursing Facility  In-House Referral:  Clinical Social Work  Discharge planning Services  CM Consult  Post Acute Care Choice:    Choice offered to:     DME Arranged:    DME Agency:     HH Arranged:    HH Agency:     Status of Service:  In process, will continue to follow  If discussed at Long Length of Stay Meetings, dates discussed:    Additional Comments:  Shawn Carattini, Chrystine OilerSharley Diane, RN 12/13/2016, 11:28 AM

## 2016-12-13 NOTE — Clinical Social Work Note (Signed)
Clinical Social Work Assessment  Patient Details  Name: Anna RubensMelba J Sponaugle MRN: [id number] Date of Birth: 03/23/1935  Date of referral:  12/13/16               Reason for consult:  Discharge Planning                Permission sought to share information with:    Permission granted to share information::     Name::        Agency::  Patient's APS social worker, Rachael Feenna Sizemore, was at bedside.   Relationship::     Contact Information:     Housing/Transportation Living arrangements for the past 2 months:  Skilled Nursing Facility Source of Information:  Patient Patient Interpreter Needed:  None Criminal Activity/Legal Involvement Pertinent to Current Situation/Hospitalization:  No - Comment as needed Significant Relationships:  Adult Children Lives with:  Self Do you feel safe going back to the place where you live?  Yes Need for family participation in patient care:  Yes (Comment)  Care giving concerns:  Patient, at this point, is having difficulty with self care.    Social Worker assessment / plan:  Patient lives alone, uses a wheelchair and says she is independent in ADLs. She is agreeable to SNF for rehab. Patient's APS social worker states that she will begin working with patient on obtaining an apartment for senior citizens where she can have increased assistance once she completes rehab.   Employment status:  Retired Database administratornsurance information:  Managed Medicare, [place] PT Recommendations:  Skilled Nursing Facility Information / Referral to community resources:  Skilled Nursing Facility  Patient/Family's Response to care:  Patient is agreeable to SNF.   Patient/Family's Understanding of and Emotional Response to Diagnosis, Current Treatment, and Prognosis:  Patient understands her diagnosis, treatment and prognosis.   Emotional Assessment Appearance:  Appears stated age Attitude/Demeanor/Rapport:   (Cooperative) Affect (typically observed):  Accepting, Calm Orientation:   Oriented to Self, Oriented to Place, Oriented to  Time, Oriented to Situation Alcohol / Substance use:  Not Applicable Psych involvement (Current and /or in the community):  No (Comment)  Discharge Needs  Concerns to be addressed:  Discharge Planning Concerns Readmission within the last 30 days:  No Current discharge risk:  None Barriers to Discharge:  No Barriers Identified   Annice NeedySettle, Kylee Umana D, LCSW 12/13/2016, 11:39 AM

## 2016-12-13 NOTE — Clinical Social Work Placement (Addendum)
   CLINICAL SOCIAL WORK PLACEMENT  NOTE  Date:  12/13/2016  Patient Details  Name: Anna RubensMelba J Norris MRN: [id number] Date of Birth: 08-Dec-1934  Clinical Social Work is seeking post-discharge placement for this patient at the Skilled  Nursing Facility level of care (*CSW will initial, date and re-position this form in  chart as items are completed):  Yes   Patient/family provided with [facility] Clinical Social Work Department's list of facilities offering this level of care within the geographic area requested by the patient (or if unable, by the patient's family).  Yes   Patient/family informed of their freedom to choose among providers that offer the needed level of care, that participate in Medicare, Medicaid or managed care program needed by the patient, have an available bed and are willing to accept the patient.  Yes   Patient/family informed of [facility]'s ownership interest in [facility] and [facility], as well as of the fact that they are under no obligation to receive care at these facilities.  PASRR submitted to EDS on       PASRR number received on       Existing PASRR number confirmed on 12/13/16     FL2 transmitted to all facilities in geographic area requested by pt/family on 12/13/16     FL2 transmitted to all facilities within larger geographic area on       Patient informed that his/her managed care company has contracts with or will negotiate with certain facilities, including the following:            Patient/family informed of bed offers received.  12/14/2016   [facility]  Physician [facility] and [facility]   [facility]  .    Patient to be transferred to facility by     EMS  Patient family notified on   of transfer.  Gavin PoundDeborah (daughter)  Name of family member notified:        PHYSICIAN       Additional Comment:     _______________________________________________ Annice NeedySettle, Heather D, LCSW 12/13/2016, 11:41 AM

## 2016-12-13 NOTE — Evaluation (Signed)
Occupational Therapy Evaluation Patient Details Name: Anna Hanna MRN: [id number] DOB: [birth year] Today's Date: 12/13/2016    History of Present Illness Anna Hanna is a 82 y.o. female with medical history significant of recurrent falls, HTN, s/p right knee replacement that was brought to the emergency room by EMT after being found on the floor covered with feces.  Per EMS report patient's neighbor called adult protective services yesterday to report that patient was on the floor but refusing help.  EMS was able to get into patient's apartment today and found her covered in feces and had pulled out her foley catheter.  Patient had a foley catheter placed secondary to significant sacral ulcers.  She has now had three admissions in the past 6 months and was sent to a SNF but she signed herself out.  Per patient she lives in an apartment where cousins are all living around her and coming to check on her.  She says they bring her food as well.  I did call patient's daughter who voices that patient does not have running water in her apartment and she is unable to walk.     Clinical Impression   Pt received in bed, agreeable to OT/PT co-evaluation. Pt reports she is using a walker or wheelchair to perform ADL tasks or functional mobility in the home. Also reporting her cousin comes daily to assist with bathing and dressing, no family present to confirm pt reports. Pt demonstrates significant limitations in mobility and ADL completion during evaluation. Pt requiring significantly increased time and cuing for doffing/donning socks. At this time pt is not safe to return home alone, will require SNF on discharge to increase independence and functioning during ADL and functional mobility tasks, as well as improve strength required for B/ADL completion.     Follow Up Recommendations  SNF    Equipment Recommendations  None recommended by OT       Precautions / Restrictions Precautions Precautions:  Fall Restrictions Weight Bearing Restrictions: No      Mobility Bed Mobility Overal bed mobility: Needs Assistance Bed Mobility: Supine to Sit     Supine to sit: Min guard     General bed mobility comments: Increased time required, encouragement and verbal cuing for sequencing/completion  Transfers Overall transfer level: Needs assistance Equipment used: Rolling walker (2 wheeled) Transfers: Sit to/from [employer] Sit to Stand: Mod assist;+2 physical assistance;From elevated surface Stand pivot transfers: Mod assist;+2 physical assistance       General transfer comment: very fearful, minimal weight on RLE, requires consistent cuing for holding walker and supporting weight through arms, as well as turning to sit in chair. Fearful of letting walker go to reach back for chair.     Balance Overall balance assessment: History of Falls                                         ADL either performed or assessed with clinical judgement   ADL Overall ADL's : Needs assistance/impaired Eating/Feeding: Set up;Bed level   Grooming: Set up;Sitting               Lower Body Dressing: Maximal assistance;Sitting/lateral leans Lower Body Dressing Details (indicate cue type and reason): Pt able to doff sock with significantly increased time, unable to donn.    Toilet Transfer Details (indicate cue type and reason): Pt with foley catheter due to  sacral ulcers           General ADL Comments: Pt demonstrating significant weakness and limitations with transfers and functional mobility. Pt requiring mod/max assist with standing tasks or tasks requiring transfers such as toileting.      Vision Baseline Vision/History: No visual deficits Patient Visual Report: No change from baseline Vision Assessment?: No apparent visual deficits            Pertinent Vitals/Pain Pain Assessment: No/denies pain     Hand Dominance Right   Extremity/Trunk  Assessment Upper Extremity Assessment Upper Extremity Assessment: Generalized weakness   Lower Extremity Assessment Lower Extremity Assessment: Defer to PT evaluation       Communication Communication Communication: No difficulties   Cognition Arousal/Alertness: Awake/alert   Overall Cognitive Status: No family/caregiver present to determine baseline cognitive functioning Area of Impairment: Orientation                 Orientation Level: Time (states year as 31)                            Home Living   Living Arrangements: Alone   Type of Home: Mobile home Home Access: Stairs to enter;Ramped entrance Entrance Stairs-Number of Steps: 2 Entrance Stairs-Rails: Can reach both Home Layout: One level     Bathroom Shower/Tub: Chief Strategy Officer: Standard     Home Equipment: Environmental consultant - 2 wheels;Cane - single point;Bedside commode;Wheelchair - power          Prior Functioning/Environment Level of Independence: Needs assistance  Gait / Transfers Assistance Needed: Pt reports she uses a walker or wheelchair for mobility. She requires assistance to get in/out of bed at times.  ADL's / Homemaking Assistance Needed: Pt states she requires assistance with dressing and bathing            OT Problem List: Decreased strength;Decreased activity tolerance;Impaired balance (sitting and/or standing);Decreased safety awareness;Decreased knowledge of use of DME or AE                       Co-evaluation PT/OT/SLP Co-Evaluation/Treatment: Yes Reason for Co-Treatment: Complexity of the patient's impairments (multi-system involvement);To address functional/ADL transfers PT goals addressed during session: Mobility/safety with mobility;Balance;Proper use of DME OT goals addressed during session: ADL's and self-care;Proper use of Adaptive equipment and DME      AM-PAC PT "6 Clicks" Daily Activity     Outcome Measure Help from another person eating  meals?: A Little Help from another person taking care of personal grooming?: A Little Help from another person toileting, which includes using toliet, bedpan, or urinal?: A Lot Help from another person bathing (including washing, rinsing, drying)?: A Lot Help from another person to put on and taking off regular upper body clothing?: A Lot Help from another person to put on and taking off regular lower body clothing?: A Lot 6 Click Score: 14   End of Session Equipment Utilized During Treatment: Gait belt;Rolling walker  Activity Tolerance: Patient tolerated treatment well Patient left: in chair;with call bell/phone within reach  OT Visit Diagnosis: Muscle weakness (generalized) (M62.81);Repeated falls (R29.6)                Time: 1610-9604 OT Time Calculation (min): 26 min Charges:  OT General Charges $OT Visit: 1 Procedure OT Evaluation $OT Eval Low Complexity: 1 Procedure G-Codes: OT G-codes **NOT FOR INPATIENT CLASS** Functional Assessment Tool Used: Clinical judgement Functional  Limitation: Self care Self Care Current Status [phone](G8987): At least 60 percent but less than 80 percent impaired, limited or restricted Self Care Goal Status (U0454(G8988): At least 60 percent but less than 80 percent impaired, limited or restricted Self Care Discharge Status [phone](G8989): At least 60 percent but less than 80 percent impaired, limited or restricted    Ezra SitesLeslie Troxler, OTR/L  [phone] 12/13/2016, 12:50 PM

## 2016-12-13 NOTE — Progress Notes (Signed)
LCSW assisting with disposition/discharge planning.  LCSW received call from nursing station that daughter was asking to speak with LCSW as she was asked to come to the hospital. Daughter reports patient just left AMA from [facility] on April 25th for ST placement.  Daughter reports she never takes her mother out of the these facilities, but she leaves and then gets home and is unable to manage or take care of herself. Daughter is [facility] [place] and this would be close to her to assist and help patient. Daughter reports it is unsafe for patient to come home and feels patient will decline and currently has the ability to make her own decisions. APS is involved, but working to assist patient with long term placement.  LCSW has started insurance auth [facility]([employer]) today at 3:15pm.  All information faxed to insurance company to being pre-auth.  Will follow up regarding placement and plans for discharge.  Anna EmoryHannah Aliviana Burdell LCSW, MSW Clinical Social Work: Optician, dispensingystem Wide [employer] Coverage for :  [phone]

## 2016-12-13 NOTE — Consult Note (Signed)
WOC Nurse wound consult note Reason for Consult: pressure injury Wound type: MASD (moisture associated skin damage Measurement: largest area 2.0cm x 1.0cm x 0.2cm pink,  Scattered partial thickness skin loss over the left/right buttock Wound bed: all are pink, moist Drainage (amount, consistency, odor) minimal Periwound: intact  Dressing procedure/placement/frequency: Ok to continue use of foam while FC in place however would recommend use of triple zinc paste for use.   Discussed affected areas with patient she wears a "pamper" during the day and sits in it when its wet or "dirty". This results in moisture associated skin damage which is classic presentation for Anna Hanna today.    Barrier cream and the limited us of incontinence briefs (only when traveling out of the home) would be the best option for her. Use of re-usable linen underpads while in the bed or WC would be ideal.  Discussed POC with patient and bedside nurse.  Re consult if needed, will not follow at this time. Thanks  Anna Hanna M[employer] MSN, RN,CWOCN, CNS [phone])

## 2016-12-14 ENCOUNTER — Observation Stay (HOSPITAL_COMMUNITY): Payer: Medicare HMO

## 2016-12-14 DIAGNOSIS — T83511D Infection and inflammatory reaction due to indwelling urethral catheter, subsequent encounter: Secondary | ICD-10-CM | POA: Diagnosis not present

## 2016-12-14 DIAGNOSIS — G9341 Metabolic encephalopathy: Secondary | ICD-10-CM | POA: Diagnosis not present

## 2016-12-14 DIAGNOSIS — L89152 Pressure ulcer of sacral region, stage 2: Secondary | ICD-10-CM | POA: Diagnosis not present

## 2016-12-14 DIAGNOSIS — A4181 Sepsis due to Enterococcus: Secondary | ICD-10-CM

## 2016-12-14 DIAGNOSIS — R4182 Altered mental status, unspecified: Secondary | ICD-10-CM | POA: Diagnosis not present

## 2016-12-14 LAB — URINE CULTURE

## 2016-12-14 LAB — BASIC METABOLIC PANEL
ANION GAP: 5 (ref 5–15)
BUN: 9 mg/dL (ref 6–20)
CHLORIDE: 107 mmol/L (ref 101–111)
CO2: 26 mmol/L (ref 22–32)
CREATININE: 0.51 mg/dL (ref 0.44–1.00)
Calcium: 8.4 mg/dL — ABNORMAL LOW (ref 8.9–10.3)
GFR calc non Af Amer: >60 mL/min (ref >60)
Glucose, Bld: 94 mg/dL (ref 65–99)
POTASSIUM: 4 mmol/L (ref 3.5–5.1)
SODIUM: 138 mmol/L (ref 135–145)

## 2016-12-14 LAB — CBC
HCT: 31 % — ABNORMAL LOW (ref 36.0–46.0)
HEMOGLOBIN: 10 g/dL — AB (ref 12.0–15.0)
MCH: 25.9 pg — ABNORMAL LOW (ref 26.0–34.0)
MCHC: 32.3 g/dL (ref 30.0–36.0)
MCV: 80.3 fL (ref 78.0–100.0)
PLATELETS: 457 10*3/uL — AB (ref 150–400)
RBC: 3.86 MIL/uL — AB (ref 3.87–5.11)
RDW: 16 % — ABNORMAL HIGH (ref 11.5–15.5)
WBC: 6.5 10*3/uL (ref 4.0–10.5)

## 2016-12-14 LAB — RPR: RPR Ser Ql: NONREACTIVE

## 2016-12-14 MED ORDER — LINEZOLID 600 MG PO TABS
600.0000 mg | ORAL_TABLET | Freq: Two times a day (BID) | ORAL | Status: DC
  Administered 2016-12-14 (×2): 600 mg via ORAL
  Filled 2016-12-14 (×3): qty 1

## 2016-12-14 MED ORDER — MUPIROCIN 2 % EX OINT: 1.0000 "application " | TOPICAL_OINTMENT | Freq: Two times a day (BID) | CUTANEOUS | 0 refills | Status: DC

## 2016-12-14 MED ORDER — ZINC OXIDE 20 % EX OINT: TOPICAL_OINTMENT | Freq: Two times a day (BID) | CUTANEOUS | 0 refills | Status: DC

## 2016-12-14 MED ORDER — LINEZOLID 600 MG PO TABS: 600.0000 mg | ORAL_TABLET | Freq: Two times a day (BID) | ORAL | 0 refills | Status: DC

## 2016-12-14 NOTE — Progress Notes (Signed)
PROGRESS NOTE  Anna Hanna ZOX:[id number] DOB: Apr 22, 1935 DOA: 12/12/2016 PCP: [name], MD  Brief History:  82 year old female with a history of recurrent falls, hypertension, right total knee arthroplasty and iron deficiency anemia presented when she was found by her neighbor covered in feces and urine. The patient states that she tripped and fell and was not able to get up. She scored poorly on her own Foley catheter with which she was discharged from the hospital on 12/04/2016. The patient was discharged to a skilled nursing facility after her admission from 12/01/2016 through 12/04/2016 for which she was treated for UTI. The patient signed out AGAINST MEDICAL ADVICE from the skilled nursing facility. The patient gives a slightly different retelling of historical events with each provider. She states that she is normally able to ambulate with a walker. Patient denies fevers, chills, headache, chest pain, dyspnea, nausea, vomiting, diarrhea, abdominal pain, dysuria, hematuria, hematochezia, and melena. Upon presentation, the patient was noted to be tachycardic with evidence of UTI and elevated lactic acid of 2.0. The pt was fluid resuscitated and admitted for further evaluation.  Assessment/Plan: Sepsis -presented with tachycardia, lactic acidosis and evidence of UTI with confusion -continue IVF -sepsis physiology resolved -continue abx  Catheter associated UTI--VRE -Catheter was present prior to admission -Patient has Foley catheter secondary to gluteal decubiti -plan to discontinue foley prior to discharge as pt has been voluntarily sitting in her urine for numerous hours rather than asking for help  -d/c ceftriaxone -start linezolid  Acute metabolic encephalopathy -Secondary to UTI and dehydration -Appears to be improving -TSH--1.647 -RPR--NEG -serum B12--464  Lactic acidosis -presented with lactic acid 2.0>>>1.0 -continue IVF  Gluteal pressure  injury--stage 2 -wound care consult -not infected on exam  Deconditioning -PT eval-->SNF  Right knee pain -xray right knee--stable R-knee arthoplasty; round density upper calf -soft tissue US of R-upper calf to r/o mass although this likely represents hematoma with recent hx of numerous falls  Hypokalemia -repleted -add KCl to IVF       Disposition Plan:  SNF 12/15/16 Family Communication:   Daughter updated on phone--Total time spent 35 minutes.  Greater than 50% spent face to face counseling and coordinating care.  Consultants:  none  Code Status:  FULL  DVT Prophylaxis: [place] Lovenox   Procedures: As Listed in Progress Note Above  Antibiotics: Ceftriaxone 5/22>>5/24 zyvox 5/24>>>    Subjective: Patient complains of right knee pain that is worse with weightbearing and ambulation. She does state that it is intermittent and is relieved with rest. Denies any fevers, chills, chest pain or breath, nausea, vomiting, diarrhea, vomiting. No dysuria or hematuria.  Objective: Vitals:   12/13/16 0522 12/13/16 2100 12/14/16 0500 12/14/16 1428  BP: 126/81 122/72 125/79 (!) 102/50  Pulse: (!) 107 (!) 105 95 85  Resp: 20 18 18 20   Temp: 99.2 F (37.3 C) 98.3 F (36.8 C) 98.5 F (36.9 C) 98.5 F (36.9 C)  TempSrc: Oral Oral Oral Oral  SpO2: 100% 99% 99% 98%  Weight:      Height:        Intake/Output Summary (Last 24 hours) at 12/14/16 1636 Last data filed at 12/14/16 1500  Gross per 24 hour  Intake             1020 ml  Output             1800 ml  Net             -  780 ml   Weight change:  Exam:   General:  Pt is alert, follows commands appropriately, not in acute distress  HEENT: No icterus, No thrush, No neck mass, [place]/AT  Cardiovascular: RRR, S1/S2, no rubs, no gallops  Respiratory: CTA bilaterally, no wheezing, no crackles, no rhonchi  Abdomen: Soft/+BS, non tender, non distended, no guarding  Extremities: trace LE edema, No lymphangitis, No petechiae,  No rashes, no synovitis;  Right upper calf with small fluctuant area without drainage or erythema   Data Reviewed: I have personally reviewed following labs and imaging studies Basic Metabolic Panel:  Recent Labs Lab 12/12/16 0848 12/13/16 0255 12/14/16 0617  NA 135 131* 138  K 3.6 3.0* 4.0  CL 96* 98* 107  CO2 27 24 26   GLUCOSE 117* 95 94  BUN 13 16 9   CREATININE 0.54 0.55 0.51  CALCIUM 9.4 8.5* 8.4*   Liver Function Tests:  Recent Labs Lab 12/12/16 0848  AST 21  ALT 17  ALKPHOS 80  BILITOT 0.9  PROT 8.3*  ALBUMIN 3.7   No results for input(s): LIPASE, AMYLASE in the last 168 hours. No results for input(s): AMMONIA in the last 168 hours. Coagulation Profile: No results for input(s): INR, PROTIME in the last 168 hours. CBC:  Recent Labs Lab 12/12/16 0848 12/14/16 0617  WBC 11.2* 6.5  NEUTROABS 8.7*  --   HGB 13.3 10.0*  HCT 40.6 31.0*  MCV 79.3 80.3  PLT 628* 457*   Cardiac Enzymes:  Recent Labs Lab 12/12/16 0848 12/12/16 1510 12/12/16 2041 12/13/16 0255  CKTOTAL 95  --   --   --   TROPONINI <0.03 <0.03 <0.03 <0.03   BNP: Invalid input(s): POCBNP CBG: No results for input(s): GLUCAP in the last 168 hours. HbA1C: No results for input(s): HGBA1C in the last 72 hours. Urine analysis:    Component Value Date/Time   COLORURINE YELLOW 12/12/2016 0848   APPEARANCEUR HAZY (A) 12/12/2016 0848   LABSPEC 1.016 12/12/2016 0848   PHURINE 7.0 12/12/2016 0848   GLUCOSEU NEGATIVE 12/12/2016 0848   HGBUR SMALL (A) 12/12/2016 0848   BILIRUBINUR NEGATIVE 12/12/2016 0848   KETONESUR 5 (A) 12/12/2016 0848   PROTEINUR 30 (A) 12/12/2016 0848   NITRITE NEGATIVE 12/12/2016 0848   LEUKOCYTESUR LARGE (A) 12/12/2016 0848   Sepsis Labs: @LABRCNTIP (procalcitonin:4,lacticidven:4) ) Recent Results (from the past 240 hour(s))  Urine culture     Status: Abnormal   Collection Time: 12/12/16  8:48 AM  Result Value Ref Range Status   Specimen Description URINE,  RANDOM  Final   Special Requests NONE  Final   Culture (A)  Final    >=100,000 COLONIES/mL VANCOMYCIN RESISTANT ENTEROCOCCUS   Report Status 12/14/2016 FINAL  Final   Organism ID, Bacteria VANCOMYCIN RESISTANT ENTEROCOCCUS (A)  Final      Susceptibility   Vancomycin resistant enterococcus - MIC*    AMPICILLIN >=32 RESISTANT Resistant     LEVOFLOXACIN >=8 RESISTANT Resistant     NITROFURANTOIN 256 RESISTANT Resistant     VANCOMYCIN >=32 RESISTANT Resistant     LINEZOLID 2 SENSITIVE Sensitive     * >=100,000 COLONIES/mL VANCOMYCIN RESISTANT ENTEROCOCCUS  MRSA PCR Screening     Status: Abnormal   Collection Time: 12/13/16  3:40 AM  Result Value Ref Range Status   MRSA by PCR POSITIVE (A) NEGATIVE Final    Comment:        The GeneXpert MRSA Assay (FDA approved for NASAL specimens only), is one component of a comprehensive MRSA  colonization surveillance program. It is not intended to diagnose MRSA infection nor to guide or monitor treatment for MRSA infections. RESULT CALLED TO, READ BACK BY AND VERIFIED WITH: HOWERTON,J @835  BY MATTHEWS,B 12/13/16      Scheduled Meds: . Chlorhexidine Gluconate Cloth  6 each Topical Q0600  . enoxaparin (LOVENOX) injection  40 mg Subcutaneous Q24H  . linezolid  600 mg Oral Q12H  . mupirocin ointment  1 application Nasal BID  . zinc oxide   Topical BID   Continuous Infusions:  Procedures/Studies: Dg Chest 2 View  Result Date: 12/12/2016 CLINICAL DATA:  82 year old female fell out of bed this morning. EXAM: CHEST  2 VIEW COMPARISON:  09/30/2016 and earlier. FINDINGS: The patient is mildly rotated to the right. Skin fold artifact suspected in the right upper chest extending obliquely to the left. Stable cardiac size and mediastinal contours. Mildly tortuous thoracic aorta. The lungs are clear. No pneumothorax, pulmonary edema or pleural effusion. Osteopenia. No acute osseous abnormality identified. Negative visible bowel gas pattern. IMPRESSION: No  acute cardiopulmonary abnormality or acute traumatic injury identified. Electronically Signed   By: Odessa Fleming M.D.   On: 12/12/2016 09:26   Dg Knee 1-2 Views Right  Result Date: 12/13/2016 CLINICAL DATA:  Prior fall.  Pain. EXAM: RIGHT KNEE - 1-2 VIEW COMPARISON:  09/30/2016. FINDINGS: Prior right knee arthroplasty. Hardware intact with anatomic alignment. Stable extensive callus formation and soft tissue ossification. No acute fracture. Soft tissue swelling. Prominent rounded soft tissue subcutaneous density upper aspect of the calf. This could represent a process such as a hematoma or seroma. Soft tissue abscess cannot be excluded. Soft tissue tumor cannot be completely excluded. IMPRESSION: 1. Stable right knee arthroplasty. Hardware intact with anatomic alignment. Stable extensive callus formation and soft tissue ossification. No acute fracture. 2. Soft tissue swelling. Prominent rounded soft tissue subcutaneous density upper aspect of the calf. This could represent a process such as a hematoma or seroma. Soft tissue abscess cannot be excluded. Soft tissue tumor cannot be completely excluded. Ultrasound of the proximal calf region can be obtained to further evaluate. Electronically Signed   By: Maisie Fus  Register   On: 12/13/2016 10:54   Ct Head Wo Contrast  Result Date: 12/12/2016 CLINICAL DATA:  82 year old female slid out of bed. Denies hitting head. Found on floor. Initial encounter. EXAM: CT HEAD WITHOUT CONTRAST TECHNIQUE: Contiguous axial images were obtained from the base of the skull through the vertex without intravenous contrast. COMPARISON:  09/30/2016. FINDINGS: Brain: No intracranial hemorrhage or CT evidence of large acute infarct. Mild chronic microvascular changes. Mild global age related atrophy without hydrocephalus. No intracranial mass lesion noted on this unenhanced exam. Partially empty sella incidentally noted. Vascular: Vascular calcifications. Skull: No skull fracture  Sinuses/Orbits: No acute orbital abnormality. Mild mucosal thickening ethmoid sinus air cells and sphenoid sinuses. Other: Mastoid air cells and middle ear cavities are clear. IMPRESSION: No intracranial hemorrhage or CT evidence of large acute infarct. Mild chronic microvascular changes. Mild mucosal thickening ethmoid sinus air cells and sphenoid sinuses. Electronically Signed   By: Lacy Duverney M.D.   On: 12/12/2016 10:04    Esten Dollar, DO  [facility] Pager [phone]  If 7PM-7AM, please contact night-coverage [email or web address] Password [email or web address] 12/14/2016, 4:36 PM   LOS: 0 days

## 2016-12-14 NOTE — Discharge Summary (Signed)
Physician Discharge Summary  Anna Hanna:[id number] DOB: [birth year] DOA: 12/12/2016  PCP: [name], MD  Admit date: 12/12/2016 Discharge date: 12/14/2016  Admitted From: Home Disposition:  SNF  Recommendations for Outpatient Follow-up:  1. Follow up with PCP in 1-2 weeks 2. Please obtain BMP/CBC in one week 3. Discontinue foley on 12/20/16     Discharge Condition: Stable CODE STATUS: FULL Diet recommendation: Heart Healthy   Brief/Interim Summary: 82 year old female with a history of recurrent falls, hypertension, right total knee arthroplasty and iron deficiency anemia presented when she was found by her neighbor covered in feces and urine. The patient states that she tripped and fell and was not able to get up. She scored poorly on her own Foley catheter with which she was discharged from the hospital on 12/04/2016. The patient was discharged to a skilled nursing facility after her admission from 12/01/2016 through 12/04/2016 for which she was treated for UTI. The patient signed out AGAINST MEDICAL ADVICE from the skilled nursing facility. The patient gives a slightly different retelling of historical events with each provider. She states that she is normally able to ambulate with a walker. Patient denies fevers, chills, headache, chest pain, dyspnea, nausea, vomiting, diarrhea, abdominal pain, dysuria, hematuria, hematochezia, and melena. Upon presentation, the patient was noted to be tachycardic with evidence of UTI and elevated lactic acid of 2.0. The pt was fluid resuscitated and admitted for further evaluation.  Discharge Diagnoses:   Sepsis -presented with tachycardia, lactic acidosis and evidence of UTI with confusion -continue IVF -sepsis physiology resolved -continue abx  Catheter associated UTI--VRE -Catheter was present prior to admission -Patient has Foley catheter secondary to gluteal decubiti -plan to discontinue foley in one week once pt more  functional and stronger to be able to get to commode -d/c ceftriaxone -start linezolid--plan 6 more days after discharge to complete 1 week of therapy  Acute metabolic encephalopathy -Secondary to UTI and dehydration -Appears to be improving -TSH--1.647 -RPR--NEG -serum B12--464  Lactic acidosis -presented with lactic acid 2.0>>>1.0 -continued IVF  Gluteal pressure injury--stage 2 -wound care consult-->Barrier cream (triple zinc) and the limited US of incontinence briefs (only when traveling out of the home).  Use of re-usable linen underpads while in the bed or WC would be ideal. -Discussed affected areas with patient she wears a "pamper" during the day and sits in it when its wet or "dirty".  -not infected on exam  Deconditioning -PT eval-->SNF  Right knee pain -xray right knee--stable R-knee arthoplasty; round density upper calf -soft tissue US of R-upper calf--large minimally complicated subcutaneous cyst; scattered areas of soft tissue edema with areas of non-shattering hyperechogenicity in the right distal thigh  Hypokalemia -repleted -add KCl to IVF  Hypertension -holding amlodipine due to soft BP -will not restart -monitor BP periodically after d/c   Discharge Instructions  Discharge Instructions    Diet - low sodium heart healthy    Complete by:  As directed    Increase activity slowly    Complete by:  As directed      Allergies as of 12/14/2016   No Known Allergies     Medication List    STOP taking these medications   amLODipine 5 MG tablet Commonly known as:  NORVASC   BIOFREEZE 4 % Gel Generic drug:  Menthol (Topical Analgesic)   cefUROXime 500 MG tablet Commonly known as:  CEFTIN     TAKE these medications   acetaminophen 325 MG tablet Commonly known as:  TYLENOL Take  650 mg by mouth 3 (three) times daily.   calcium citrate 950 MG tablet Commonly known as:  CALCITRATE - dosed in mg elemental calcium Take 200 mg of elemental  calcium by mouth daily.   feeding supplement (PRO-STAT SUGAR FREE 64) Liqd Take 30 mLs by mouth 2 (two) times daily.   iron polysaccharides 150 MG capsule Commonly known as:  NIFEREX Take 150 mg by mouth daily.   latanoprost 0.005 % ophthalmic solution Commonly known as:  XALATAN Place 1 drop into both eyes at bedtime.   linezolid 600 MG tablet Commonly known as:  ZYVOX Take 1 tablet (600 mg total) by mouth every 12 (twelve) hours.   mupirocin ointment 2 % Commonly known as:  BACTROBAN Place 1 application into the nose 2 (two) times daily. X 5 days   Potassium Chloride ER 20 MEQ Tbcr Take 20 mEq by mouth daily at 6 (six) AM.   zinc oxide 20 % ointment Apply topically 2 (two) times daily. To buttocks and prn soilage       No Known Allergies  Consultations:  none   Procedures/Studies: Dg Chest 2 View  Result Date: 12/12/2016 CLINICAL DATA:  82 year old female fell out of bed this morning. EXAM: CHEST  2 VIEW COMPARISON:  09/30/2016 and earlier. FINDINGS: The patient is mildly rotated to the right. Skin fold artifact suspected in the right upper chest extending obliquely to the left. Stable cardiac size and mediastinal contours. Mildly tortuous thoracic aorta. The lungs are clear. No pneumothorax, pulmonary edema or pleural effusion. Osteopenia. No acute osseous abnormality identified. Negative visible bowel gas pattern. IMPRESSION: No acute cardiopulmonary abnormality or acute traumatic injury identified. Electronically Signed   By: Odessa FlemingH  Hall M.D.   On: 12/12/2016 09:26   Dg Knee 1-2 Views Right  Result Date: 12/13/2016 CLINICAL DATA:  Prior fall.  Pain. EXAM: RIGHT KNEE - 1-2 VIEW COMPARISON:  09/30/2016. FINDINGS: Prior right knee arthroplasty. Hardware intact with anatomic alignment. Stable extensive callus formation and soft tissue ossification. No acute fracture. Soft tissue swelling. Prominent rounded soft tissue subcutaneous density upper aspect of the calf. This could  represent a process such as a hematoma or seroma. Soft tissue abscess cannot be excluded. Soft tissue tumor cannot be completely excluded. IMPRESSION: 1. Stable right knee arthroplasty. Hardware intact with anatomic alignment. Stable extensive callus formation and soft tissue ossification. No acute fracture. 2. Soft tissue swelling. Prominent rounded soft tissue subcutaneous density upper aspect of the calf. This could represent a process such as a hematoma or seroma. Soft tissue abscess cannot be excluded. Soft tissue tumor cannot be completely excluded. Ultrasound of the proximal calf region can be obtained to further evaluate. Electronically Signed   By: Maisie Fushomas  Register   On: 12/13/2016 10:54   Ct Head Wo Contrast  Result Date: 12/12/2016 CLINICAL DATA:  82 year old female slid out of bed. Denies hitting head. Found on floor. Initial encounter. EXAM: CT HEAD WITHOUT CONTRAST TECHNIQUE: Contiguous axial images were obtained from the base of the skull through the vertex without intravenous contrast. COMPARISON:  09/30/2016. FINDINGS: Brain: No intracranial hemorrhage or CT evidence of large acute infarct. Mild chronic microvascular changes. Mild global age related atrophy without hydrocephalus. No intracranial mass lesion noted on this unenhanced exam. Partially empty sella incidentally noted. Vascular: Vascular calcifications. Skull: No skull fracture Sinuses/Orbits: No acute orbital abnormality. Mild mucosal thickening ethmoid sinus air cells and sphenoid sinuses. Other: Mastoid air cells and middle ear cavities are clear. IMPRESSION: No intracranial hemorrhage or CT  evidence of large acute infarct. Mild chronic microvascular changes. Mild mucosal thickening ethmoid sinus air cells and sphenoid sinuses. Electronically Signed   By: Lacy Duverney M.D.   On: 12/12/2016 10:04   [place] Rt Lower Extrem Ltd Soft Tissue Non Vascular  Result Date: 12/14/2016 CLINICAL DATA:  RIGHT leg edema, injury question fluid  collection EXAM: ULTRASOUND RIGHT LOWER EXTREMITY LIMITED TECHNIQUE: Ultrasound examination of the lower extremity soft tissues was performed in the area of clinical concern. COMPARISON:  None Radiographic correlation: RIGHT knee radiographs 12/13/2016 FINDINGS: At site patient's symptoms at the anterior distal RIGHT thigh, scattered soft tissue swelling is identified. Scattered echogenic foci without definite shadowing are seen at the anterior thigh distally, corresponding to areas of soft tissue calcification present on recent knee radiographs, question myositis ossificans versus sequela of prior knee replacement surgery. At the site of radiographic abnormality on RIGHT knee radiographs, a large subcutaneous cyst is seen which measures 3.8 x 2.6 x 3.6 cm, containing a single echogenic focus question calcification otherwise simple in character. IMPRESSION: Scattered soft tissue swelling with areas of nonshadowing hyperechogenicity at the anterior distal RIGHT thigh, question related to last os ossific cans/soft tissue calcifications present on recent knee radiographs. Large minimally complicated is subcutaneous cyst at the upper RIGHT calf anteromedially, corresponding to the radiographic abnormality. Electronically Signed   By: Ulyses Southward M.D.   On: 12/14/2016 16:41        Discharge Exam: Vitals:   12/14/16 0500 12/14/16 1428  BP: 125/79 (!) 102/50  Pulse: 95 85  Resp: 18 20  Temp: 98.5 F (36.9 C) 98.5 F (36.9 C)   Vitals:   12/13/16 0522 12/13/16 2100 12/14/16 0500 12/14/16 1428  BP: 126/81 122/72 125/79 (!) 102/50  Pulse: (!) 107 (!) 105 95 85  Resp: 20 18 18 20   Temp: 99.2 F (37.3 C) 98.3 F (36.8 C) 98.5 F (36.9 C) 98.5 F (36.9 C)  TempSrc: Oral Oral Oral Oral  SpO2: 100% 99% 99% 98%  Weight:      Height:        General: Pt is alert, awake, not in acute distress Cardiovascular: RRR, S1/S2 +, no rubs, no gallops Respiratory: CTA bilaterally, no wheezing, no  rhonchi Abdominal: Soft, NT, ND, bowel sounds + Extremities: no edema, no cyanosis   The results of significant diagnostics from this hospitalization (including imaging, microbiology, ancillary and laboratory) are listed below for reference.    Significant Diagnostic Studies: Dg Chest 2 View  Result Date: 12/12/2016 CLINICAL DATA:  82 year old female fell out of bed this morning. EXAM: CHEST  2 VIEW COMPARISON:  09/30/2016 and earlier. FINDINGS: The patient is mildly rotated to the right. Skin fold artifact suspected in the right upper chest extending obliquely to the left. Stable cardiac size and mediastinal contours. Mildly tortuous thoracic aorta. The lungs are clear. No pneumothorax, pulmonary edema or pleural effusion. Osteopenia. No acute osseous abnormality identified. Negative visible bowel gas pattern. IMPRESSION: No acute cardiopulmonary abnormality or acute traumatic injury identified. Electronically Signed   By: Odessa Fleming M.D.   On: 12/12/2016 09:26   Dg Knee 1-2 Views Right  Result Date: 12/13/2016 CLINICAL DATA:  Prior fall.  Pain. EXAM: RIGHT KNEE - 1-2 VIEW COMPARISON:  09/30/2016. FINDINGS: Prior right knee arthroplasty. Hardware intact with anatomic alignment. Stable extensive callus formation and soft tissue ossification. No acute fracture. Soft tissue swelling. Prominent rounded soft tissue subcutaneous density upper aspect of the calf. This could represent a process such as a hematoma  or seroma. Soft tissue abscess cannot be excluded. Soft tissue tumor cannot be completely excluded. IMPRESSION: 1. Stable right knee arthroplasty. Hardware intact with anatomic alignment. Stable extensive callus formation and soft tissue ossification. No acute fracture. 2. Soft tissue swelling. Prominent rounded soft tissue subcutaneous density upper aspect of the calf. This could represent a process such as a hematoma or seroma. Soft tissue abscess cannot be excluded. Soft tissue tumor cannot be  completely excluded. Ultrasound of the proximal calf region can be obtained to further evaluate. Electronically Signed   By: Maisie Fus  Register   On: 12/13/2016 10:54   Ct Head Wo Contrast  Result Date: 12/12/2016 CLINICAL DATA:  82 year old female slid out of bed. Denies hitting head. Found on floor. Initial encounter. EXAM: CT HEAD WITHOUT CONTRAST TECHNIQUE: Contiguous axial images were obtained from the base of the skull through the vertex without intravenous contrast. COMPARISON:  09/30/2016. FINDINGS: Brain: No intracranial hemorrhage or CT evidence of large acute infarct. Mild chronic microvascular changes. Mild global age related atrophy without hydrocephalus. No intracranial mass lesion noted on this unenhanced exam. Partially empty sella incidentally noted. Vascular: Vascular calcifications. Skull: No skull fracture Sinuses/Orbits: No acute orbital abnormality. Mild mucosal thickening ethmoid sinus air cells and sphenoid sinuses. Other: Mastoid air cells and middle ear cavities are clear. IMPRESSION: No intracranial hemorrhage or CT evidence of large acute infarct. Mild chronic microvascular changes. Mild mucosal thickening ethmoid sinus air cells and sphenoid sinuses. Electronically Signed   By: Lacy Duverney M.D.   On: 12/12/2016 10:04   [place] Rt Lower Extrem Ltd Soft Tissue Non Vascular  Result Date: 12/14/2016 CLINICAL DATA:  RIGHT leg edema, injury question fluid collection EXAM: ULTRASOUND RIGHT LOWER EXTREMITY LIMITED TECHNIQUE: Ultrasound examination of the lower extremity soft tissues was performed in the area of clinical concern. COMPARISON:  None Radiographic correlation: RIGHT knee radiographs 12/13/2016 FINDINGS: At site patient's symptoms at the anterior distal RIGHT thigh, scattered soft tissue swelling is identified. Scattered echogenic foci without definite shadowing are seen at the anterior thigh distally, corresponding to areas of soft tissue calcification present on recent knee  radiographs, question myositis ossificans versus sequela of prior knee replacement surgery. At the site of radiographic abnormality on RIGHT knee radiographs, a large subcutaneous cyst is seen which measures 3.8 x 2.6 x 3.6 cm, containing a single echogenic focus question calcification otherwise simple in character. IMPRESSION: Scattered soft tissue swelling with areas of nonshadowing hyperechogenicity at the anterior distal RIGHT thigh, question related to last os ossific cans/soft tissue calcifications present on recent knee radiographs. Large minimally complicated is subcutaneous cyst at the upper RIGHT calf anteromedially, corresponding to the radiographic abnormality. Electronically Signed   By: Ulyses Southward M.D.   On: 12/14/2016 16:41     Microbiology: Recent Results (from the past 240 hour(s))  Urine culture     Status: Abnormal   Collection Time: 12/12/16  8:48 AM  Result Value Ref Range Status   Specimen Description URINE, RANDOM  Final   Special Requests NONE  Final   Culture (A)  Final    >=100,000 COLONIES/mL VANCOMYCIN RESISTANT ENTEROCOCCUS   Report Status 12/14/2016 FINAL  Final   Organism ID, Bacteria VANCOMYCIN RESISTANT ENTEROCOCCUS (A)  Final      Susceptibility   Vancomycin resistant enterococcus - MIC*    AMPICILLIN >=32 RESISTANT Resistant     LEVOFLOXACIN >=8 RESISTANT Resistant     NITROFURANTOIN 256 RESISTANT Resistant     VANCOMYCIN >=32 RESISTANT Resistant  LINEZOLID 2 SENSITIVE Sensitive     * >=100,000 COLONIES/mL VANCOMYCIN RESISTANT ENTEROCOCCUS  MRSA PCR Screening     Status: Abnormal   Collection Time: 12/13/16  3:40 AM  Result Value Ref Range Status   MRSA by PCR POSITIVE (A) NEGATIVE Final    Comment:        The GeneXpert MRSA Assay (FDA approved for NASAL specimens only), is one component of a comprehensive MRSA colonization surveillance program. It is not intended to diagnose MRSA infection nor to guide or monitor treatment for MRSA  infections. RESULT CALLED TO, READ BACK BY AND VERIFIED WITH: HOWERTON,J @835  BY MATTHEWS,B 12/13/16      Labs: Basic Metabolic Panel:  Recent Labs Lab 12/12/16 0848 12/13/16 0255 12/14/16 0617  NA 135 131* 138  K 3.6 3.0* 4.0  CL 96* 98* 107  CO2 27 24 26   GLUCOSE 117* 95 94  BUN 13 16 9   CREATININE 0.54 0.55 0.51  CALCIUM 9.4 8.5* 8.4*   Liver Function Tests:  Recent Labs Lab 12/12/16 0848  AST 21  ALT 17  ALKPHOS 80  BILITOT 0.9  PROT 8.3*  ALBUMIN 3.7   No results for input(s): LIPASE, AMYLASE in the last 168 hours. No results for input(s): AMMONIA in the last 168 hours. CBC:  Recent Labs Lab 12/12/16 0848 12/14/16 0617  WBC 11.2* 6.5  NEUTROABS 8.7*  --   HGB 13.3 10.0*  HCT 40.6 31.0*  MCV 79.3 80.3  PLT 628* 457*   Cardiac Enzymes:  Recent Labs Lab 12/12/16 0848 12/12/16 1510 12/12/16 2041 12/13/16 0255  CKTOTAL 95  --   --   --   TROPONINI <0.03 <0.03 <0.03 <0.03   BNP: Invalid input(s): POCBNP CBG: No results for input(s): GLUCAP in the last 168 hours.  Time coordinating discharge:  Greater than 30 minutes  Signed:  Chuck Caban, DO [facility] Pager: [phone] 12/14/2016, 5:40 PM

## 2016-12-14 NOTE — Clinical Social Work Note (Signed)
LCSW left a message for patient's daughter requesting return contact in an attempt to provide bed offers and advise of likely discharge today.  [facility] request [facility] still pending. LCSW left a message for the nurse, Lanice SchwabMisty, who is reviewing patient's request at [phone].    Eaden Hettinger, Juleen ChinaHeather D, LCSW

## 2016-12-14 NOTE — Progress Notes (Signed)
CSW confirmed that pt could be [facility] this pm with Fayrene FearingJames, the facilty's administrator.  D/c paperwork send via HUB and Lupita LeashDonna, RN at the facility provided room number for pt.  Pt's RN, West BaliMary Anne, informed and will call report and arrange transport.  VM left for pt's daughter, Stanton KidneyDebra, at [phone].  Pollyann SavoyJody Shauntelle Jamerson, LCSW Evening/ED Coverage [phone]

## 2016-12-14 NOTE — Progress Notes (Addendum)
LCSW following for disposition. [employer] Berkley Harveyauth has been obtained for SNF:  [id number] [facility] [place] if daughter agreeable.  Calls placed to daughter x2  [phone] and message left.   4:12 PM Spoke with daughter and she is agreeable to plan [facility] [place].. Aware it will be most likely Friday because we are still waiting on the ultrasound results.  Will speak with daughter in the morning.   Call placed to RN in reference of family at the bedside in which she reports none. Patient waxes and wanes with cognition per staff, but aware of SNF placement and has been agreeable. APS involved with patient as well, call placed and message left for Rachael Feenna Sizemore  [phone]  [id number]. 3:25 PM Call Returned from APS, reports patient cannot go home, unsafe.  LCSW continues to make efforts to speak with daughter Gavin PoundDeborah.  APS has not taken guardianship.   Awaiting medical clearance from MD for discharge. Patient remains to need an ultrasound. LCSW will continue to try and reach family in effort for disposition.  3:36 PM Patient is agree[facility] [place] [facility]. Facility aware of plan and in agreement. Message left for daughter in effort to keep her informed.  Awaiting ultrasound results per MD, if stable can DC today.   Deretha EmoryHannah Deziya Amero LCSW, MSW Clinical Social Work: Optician, dispensingystem [employer] Coverage for :  [phone]

## 2016-12-14 NOTE — Progress Notes (Signed)
Report called to [facility]. 

## 2017-02-28 ENCOUNTER — Telehealth: Payer: Self-pay | Admitting: Orthopedic Surgery

## 2017-02-28 NOTE — Telephone Encounter (Addendum)
[facility] [facility] [phone]/fax [phone]. Called, spoke with staff, who transferred me to physical therapy department. Spoke with Loraine LericheMark, therapist with [facility]. He relays that patient's knee is not flexing as he is working with patient. States patient mentioned possibly seeing Dr Romeo AppleHarrison.  Per notes, last office visit 06/23/16, Dr Romeo AppleHarrison [facility].  Patient's appointment there was 06/28/16, and patient has been following up there for orthopaedic care.  States he will [facility].

## 2017-03-07 ENCOUNTER — Telehealth: Payer: Self-pay | Admitting: Orthopedic Surgery

## 2017-03-07 NOTE — Telephone Encounter (Signed)
Called back to therapist, reached private voice mail, left essage; relayed, and asked to call back if any further questions.

## 2017-03-07 NOTE — Telephone Encounter (Signed)
He needs to contact ordering providers office.

## 2017-03-07 NOTE — Telephone Encounter (Signed)
Call received back from physical therapist(works under [facility]), Charleston RopesMark Patillo, who is [facility], ph [phone]/fax [phone]. Mark, therapist, relays again that patient's knee is not flexing as he is working with patient. States patient is mentioning possibly seeing Dr Romeo AppleHarrison, however, as previously relayed per telephone note of 02/28/17, patient had been referred out by Dr Romeo AppleHarrison, to [facility], and has not been seen since 06/23/16, nor has Dr Romeo AppleHarrison recently ordered physical therapy according to chart.  Loraine LericheMark relays that it was Dr Selena BattenKim who ordered therapy.  At that time, he stated he would contact [facility] regarding this matter.    He is calling back to speak with clinical staff for further clarification. His Direct ph# is [phone], or [facility].

## 2017-04-30 DIAGNOSIS — M898X9 Other specified disorders of bone, unspecified site: Secondary | ICD-10-CM | POA: Insufficient documentation

## 2017-04-30 DIAGNOSIS — M24561 Contracture, right knee: Secondary | ICD-10-CM | POA: Insufficient documentation

## 2017-07-13 ENCOUNTER — Emergency Department (HOSPITAL_COMMUNITY)
Admission: EM | Admit: 2017-07-13 | Discharge: 2017-07-13 | Disposition: A | Discharge status: Home / Self Care | Payer: Medicare HMO | Attending: Emergency Medicine | Admitting: Emergency Medicine

## 2017-07-13 ENCOUNTER — Encounter (HOSPITAL_COMMUNITY): Payer: Self-pay

## 2017-07-13 ENCOUNTER — Other Ambulatory Visit: Payer: Self-pay

## 2017-07-13 ENCOUNTER — Emergency Department (HOSPITAL_COMMUNITY): Payer: Medicare HMO

## 2017-07-13 DIAGNOSIS — I10 Essential (primary) hypertension: Secondary | ICD-10-CM | POA: Diagnosis not present

## 2017-07-13 DIAGNOSIS — W06XXXA Fall from bed, initial encounter: Secondary | ICD-10-CM | POA: Insufficient documentation

## 2017-07-13 DIAGNOSIS — G8929 Other chronic pain: Secondary | ICD-10-CM | POA: Diagnosis not present

## 2017-07-13 DIAGNOSIS — W19XXXA Unspecified fall, initial encounter: Secondary | ICD-10-CM

## 2017-07-13 DIAGNOSIS — M25561 Pain in right knee: Secondary | ICD-10-CM | POA: Diagnosis not present

## 2017-07-13 DIAGNOSIS — Z79899 Other long term (current) drug therapy: Secondary | ICD-10-CM | POA: Insufficient documentation

## 2017-07-13 DIAGNOSIS — Z043 Encounter for examination and observation following other accident: Secondary | ICD-10-CM | POA: Diagnosis not present

## 2017-07-13 DIAGNOSIS — F17210 Nicotine dependence, cigarettes, uncomplicated: Secondary | ICD-10-CM | POA: Insufficient documentation

## 2017-07-13 NOTE — ED Notes (Signed)
[facility]. Damian Leavellrudy stated she wanted pt checked for the emesis pt had yesterday that was black. I advised her I would let edp know. Pt denies any pain/nausea at this time

## 2017-07-13 NOTE — ED Provider Notes (Signed)
After discharge nurse got report that patient was "vomiting black yesterday" from facility where patient resides.  Patient states she is presently hungry denies any nausea.  Denies abdominal pain or chest pain   Doug SouJacubowitz, Saray Capasso, MD 07/13/17 [phone]

## 2017-07-13 NOTE — ED Notes (Signed)
Patient transported to X-ray 

## 2017-07-13 NOTE — ED Triage Notes (Signed)
EMS [facility] for [facility]. Report pt "slid out of bed, landing on buttocks." Pt denies pain or injury. Pt says she had some nausea and vomiting yesterday, but feels fine today with no symptoms. EMS reports facility staff encouraged pt to come for evaluation to ensure "everything was okay".

## 2017-07-13 NOTE — ED Notes (Signed)
Dr. Sherri SearJacubawitz in to assess pt. Pt hungry, denies any nausea at this time and feels pt can be d/c. I advised [facility]rud[facility] and she stated someone will pick her up in about 30 min.

## 2017-07-13 NOTE — ED Provider Notes (Signed)
[facility] EMERGENCY DEPARTMENT Provider Note   CSN: [id number] Arrival date & time: 07/13/17  [id number]     History   Chief Complaint Chief Complaint  Patient presents with  . Fall    denies pain    HPI Anna Phillips GroutJ Fraser is a 82 y.o. female.  Patient presents to the ER for evaluation after a fall.  Patient reports that she thinks she rolled out of bed.  She comes from a nursing home, was sent to the ER for evaluation because of protocol.  She is without complaints at arrival.  He denies headache, neck pain, chest pain, arm pain, hip pain.  She is experiencing right knee pain but reports that this is chronic for her since she had knee surgery.      Past Medical History:  Diagnosis Date  . Glaucoma   . Hypercholesteremia   . Hypertension   . Hypokalemia   . Status post total right knee replacement     Patient Active Problem List   Diagnosis Date Noted  . Sepsis due to Enterococcus (HCC) 12/14/2016  . Pressure injury of skin 12/13/2016  . Acute metabolic encephalopathy 12/13/2016  . Sepsis due to undetermined organism (HCC) 12/13/2016  . Fall 12/12/2016  . Living accommodation issues 12/12/2016  . Altered mental status 12/12/2016  . Decubitus ulcer of right ischium, unstageable (HCC)   . UTI (urinary tract infection) 12/03/2016  . UTI (urinary tract infection) due to urinary indwelling catheter (HCC) 12/02/2016  . Benign essential HTN 12/02/2016  . Rhabdomyolysis 09/30/2016  . Immobility 09/30/2016  . Hypokalemia 09/30/2016  . Pressure ulcer, stage 2 09/30/2016  . Acute renal failure (HCC) 09/30/2016  . Hypothermia 09/30/2016  . 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 Hyponatremia 04/02/2016  . Hyperglycemia 04/02/2016  . Femur fracture, right (HCC) 04/02/2016  . Osteoarthritis of right knee 03/30/2016  . Arthritis of knee, right     Past Surgical History:  Procedure Laterality Date  .  ABDOMINAL HYSTERECTOMY    . CATARACT EXTRACTION    . COLONOSCOPY N/A 12/10/2013   Procedure: COLONOSCOPY;  Surgeon: West BaliSandi L Fields, MD;  [facility]: [facility];  Service: Endoscopy;  Laterality: N/A;  8:30 AM  . TOTAL KNEE ARTHROPLASTY Right 03/30/2016   Procedure: TOTAL KNEE ARTHROPLASTY;  Surgeon: Vickki HearingStanley E Harrison, MD;  Location: [facility];  Service: Orthopedics;  Laterality: Right;    OB History    No data available       Home Medications    Prior to Admission medications   Medication Sig Start Date End Date Taking? Authorizing Provider  acetaminophen (TYLENOL) 325 MG tablet Take 650 mg by mouth 3 (three) times daily.     [provider]  Amino Acids-Protein Hydrolys (FEEDING SUPPLEMENT, PRO-STAT SUGAR FREE 64,) LIQD Take 30 mLs by mouth 2 (two) times daily.    [provider]  calcium citrate (CALCITRATE - DOSED IN MG ELEMENTAL CALCIUM) 950 MG tablet Take 200 mg of elemental calcium by mouth daily.    [provider]  iron polysaccharides (NIFEREX) 150 MG capsule Take 150 mg by mouth daily.    [provider]  latanoprost (XALATAN) 0.005 % ophthalmic solution Place 1 drop into both eyes at bedtime.    [provider]  linezolid (ZYVOX) 600 MG tablet Take 1 tablet (600 mg total) by mouth every 12 (twelve) hours. 12/14/16   Catarina Hartshornat, David, MD  mupirocin ointment (BACTROBAN) 2 % Place  1 application into the nose 2 (two) times daily. X 5 days 12/14/16   Catarina Hartshornat, David, MD  Potassium Chloride ER 20 MEQ TBCR Take 20 mEq by mouth daily at 6 (six) AM.    [provider]  zinc oxide 20 % ointment Apply topically 2 (two) times daily. To buttocks and prn soilage 12/14/16   Catarina Hartshornat, David, MD    Family History Family History  Problem Relation Age of Onset  . Cancer Mother   . Arthritis Mother   . Hypotension Father   . Cancer Sister   . Hypotension Sister   . Colon cancer Neg Hx     Social History Social History   Tobacco Use  . Smoking status:  Current Some Day Smoker    Packs/day: 0.25    Years: 3.00    Pack years: 0.75    Types: Cigarettes  . Smokeless tobacco: Never Used  Substance Use Topics  . Alcohol use: No  . Drug use: No     Allergies   Patient has no known allergies.   Review of Systems Review of Systems  Musculoskeletal: Positive for arthralgias (Right knee, chronic).  All other systems reviewed and are negative.    Physical Exam Updated Vital Signs BP 133/74 (BP Location: Right Arm)   Pulse 96   Temp 98.8 F (37.1 C) (Oral)   Resp 16   Ht 5\' 5"  (1.651 m)   Wt 86.2 kg (190 lb)   SpO2 97%   BMI 31.62 kg/m   Physical Exam  Constitutional: She is oriented to person, place, and time. She appears well-developed and well-nourished. No distress.  HENT:  Head: Normocephalic and atraumatic.  Right Ear: Hearing normal.  Left Ear: Hearing normal.  Nose: Nose normal.  Mouth/Throat: Oropharynx is clear and moist and mucous membranes are normal.  Eyes: Conjunctivae and EOM are normal. Pupils are equal, round, and reactive to light.  Neck: Normal range of motion. Neck supple.  Cardiovascular: Regular rhythm, S1 normal and S2 normal. Exam reveals no gallop and no friction rub.  No murmur heard. Pulmonary/Chest: Effort normal and breath sounds normal. No respiratory distress. She exhibits no tenderness.  Abdominal: Soft. Normal appearance and bowel sounds are normal. There is no hepatosplenomegaly. There is no tenderness. There is no rebound, no guarding, no tenderness at McBurney's point and negative Murphy's sign. No hernia.  Musculoskeletal:       Right knee: She exhibits decreased range of motion (Held in partial flexion, cannot extend). Tenderness found.  Neurological: She is alert and oriented to person, place, and time. She has normal strength. No cranial nerve deficit or sensory deficit. Coordination normal. GCS eye subscore is 4. GCS verbal subscore is 5. GCS motor subscore is 6.  Skin: Skin is warm,  dry and intact. No rash noted. No cyanosis.  Psychiatric: She has a normal mood and affect. Her speech is normal and behavior is normal. Thought content normal.  Nursing note and vitals reviewed.    ED Treatments / Results  Labs (all labs ordered are listed, but only abnormal results are displayed) Labs Reviewed - No data to display  EKG  EKG Interpretation None       Radiology Dg Knee Complete 4 Views Right  Result Date: 07/13/2017 CLINICAL DATA:  Right knee pain after fall. EXAM: RIGHT KNEE - COMPLETE 4+ VIEW COMPARISON:  Multiple prior exams most recently 12/13/2016 FINDINGS: Revision total knee arthroplasty in unchanged alignment. 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 No acute or periprosthetic fracture, proximal most femoral shaft is not entirely included in the field of view. Chronic rounded soft tissue prominent in the medial soft tissues. IMPRESSION: Unchanged appearance of the right knee with right knee arthroplasty unchanged in alignment. No evidence of acute fracture. Proximal most aspect of the femoral arthroplasty not entirely included in the field of view. Electronically Signed   By: Rubye Oaks M.D.   On: 07/13/2017 06:04    Procedures Procedures (including critical care time)  Medications Ordered in ED Medications - No data to display   Initial Impression / Assessment and Plan / ED Course  I have reviewed the triage vital signs and the nursing notes.  Pertinent labs & imaging results that were available during my care of the patient were reviewed by me and considered in my medical decision making (see chart for details).     Patient arrives in the ER without complaints.  She was sent to the ER for evaluation by protocol because of a fall at nursing home.  Examination did not reveal any evidence of injury.  She does have some decreased range of motion, pain and  tenderness of the right knee which she reports is chronic.  X-ray was performed and does show revision of total knee arthroplasty that is unchanged in alignment.  These are consistent with chronic changes, no injury.  Patient is appropriate for return to the nursing home.  Final Clinical Impressions(s) / ED Diagnoses   Final diagnoses:  Fall, initial encounter    ED Discharge Orders    None       Pollina, Canary Brim, MD 07/13/17 [phone]

## 2017-08-22 ENCOUNTER — Ambulatory Visit (INDEPENDENT_AMBULATORY_CARE_PROVIDER_SITE_OTHER): Payer: Medicare HMO | Admitting: Orthopedic Surgery

## 2017-08-22 VITALS — BP 122/75 | HR 67

## 2017-08-22 DIAGNOSIS — T84018S Broken internal joint prosthesis, other site, sequela: Secondary | ICD-10-CM

## 2017-08-22 DIAGNOSIS — M899 Disorder of bone, unspecified: Secondary | ICD-10-CM | POA: Diagnosis not present

## 2017-08-22 DIAGNOSIS — Z96659 Presence of unspecified artificial knee joint: Secondary | ICD-10-CM

## 2017-08-22 NOTE — Progress Notes (Signed)
Progress Note   Patient ID: Anna Hanna, female   DOB: Dec 14, 1934, 83 y.o.   MRN: [id number]  Chief Complaint  Patient presents with  . Knee Pain    Cyst on right knee.    83 year old female underwent uncomplicated total knee replacement right knee had a postop fracture was [facility].  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  She has not been able to walk for a year she does not have a significant amount of pain in the knee but constant stiffness.  She does have a mass over the medial side of the knee which is a chronic cyst about 3-4 cm wide by 3-4 cm long which is not painful unless it is touched at which time she complains of tenderness.  Pain is described as dull intermittent and mild     Review of Systems  Constitutional: Negative for chills and fever.  Respiratory: Negative for shortness of breath.   Skin: Negative for itching and rash.   No outpatient medications have been marked as taking for the 08/22/17 encounter (Office Visit) with Vickki HearingHarrison, Nickolaos Brallier E, MD.    Past Medical History:  Diagnosis Date  . Glaucoma   . Hypercholesteremia   . Hypertension   . Hypokalemia   . Status post total right knee replacement      No Known Allergies  BP 122/75   Pulse 67    Physical Exam  Constitutional: She is oriented to person, place, and time. She appears well-developed and well-nourished.  Grooming and hygiene are normal  Musculoskeletal:  Gait: The patient cannot stand on the right leg she has a 90 degree flexion contracture  Neurological: She is alert and oriented to person, place, and time.  Psychiatric: She has a normal mood and affect. Her behavior is normal. Thought content normal.    Right Knee Exam   Tenderness  Right knee tenderness location: Just below the joint line there is a 6 mass firm subcutaneous mobile 3 x 4 cm.  Range of Motion  Right knee  extension: 90.  Flexion: 110   Tests  Drawer:  Anterior - negative    Posterior - negative  Other  Erythema: absent Scars: present Sensation: normal Pulse: present Swelling: none   Left Knee Exam   Muscle Strength  The patient has normal left knee strength.  Tenderness  The patient is experiencing no tenderness.   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  No real good options at this point.  She has contracted knee which manipulation would not help, if you do a resection arthroplasty she could not walk if you do an amputation she could use her prosthesis but is unlikely to use it and she does not want to have rotation.  The other option would be a fusion with bone grafting but with the extensive loss of  distal femoral bone this probably would not work as well  The patient would like to be seen by another revision specialist which can be arranged through the nursing home Fuller Canada, MD 08/22/2017 10:43 AM

## 2017-08-23 ENCOUNTER — Telehealth: Payer: Self-pay | Admitting: Orthopedic Surgery

## 2017-08-23 NOTE — Telephone Encounter (Signed)
I have called facility to advise. They will make the appointment.

## 2017-08-23 NOTE — Telephone Encounter (Signed)
Patient called and left message wanting to know if we are [facility] or if someone else was supposed to do it?  Please call her and let her know  Thanks

## 2017-08-23 NOTE — Telephone Encounter (Signed)
Dr Romeo AppleHarrison has asked the facility where she lives to do this, I told patient this yesterday and so did Dr. Romeo AppleHarrison.

## 2017-08-28 ENCOUNTER — Encounter (HOSPITAL_COMMUNITY): Payer: Self-pay | Admitting: Emergency Medicine

## 2017-08-28 ENCOUNTER — Emergency Department (HOSPITAL_COMMUNITY)
Admission: EM | Admit: 2017-08-28 | Discharge: 2017-08-28 | Disposition: A | Discharge status: Home / Self Care | Payer: Medicare HMO | Attending: Emergency Medicine | Admitting: Emergency Medicine

## 2017-08-28 DIAGNOSIS — Z79899 Other long term (current) drug therapy: Secondary | ICD-10-CM | POA: Diagnosis not present

## 2017-08-28 DIAGNOSIS — F17210 Nicotine dependence, cigarettes, uncomplicated: Secondary | ICD-10-CM | POA: Insufficient documentation

## 2017-08-28 DIAGNOSIS — R112 Nausea with vomiting, unspecified: Secondary | ICD-10-CM | POA: Insufficient documentation

## 2017-08-28 DIAGNOSIS — R1110 Vomiting, unspecified: Secondary | ICD-10-CM | POA: Diagnosis present

## 2017-08-28 DIAGNOSIS — I10 Essential (primary) hypertension: Secondary | ICD-10-CM | POA: Diagnosis not present

## 2017-08-28 DIAGNOSIS — R197 Diarrhea, unspecified: Secondary | ICD-10-CM

## 2017-08-28 LAB — URINALYSIS, ROUTINE W REFLEX MICROSCOPIC
BILIRUBIN URINE: NEGATIVE
Glucose, UA: NEGATIVE mg/dL
HGB URINE DIPSTICK: NEGATIVE
Ketones, ur: NEGATIVE mg/dL
Leukocytes, UA: NEGATIVE
Nitrite: NEGATIVE
PROTEIN: 100 mg/dL — AB
Specific Gravity, Urine: 1.006 (ref 1.005–1.030)
pH: 7 (ref 5.0–8.0)

## 2017-08-28 LAB — COMPREHENSIVE METABOLIC PANEL
ALT: 14 U/L (ref 14–54)
AST: 23 U/L (ref 15–41)
Albumin: 4.3 g/dL (ref 3.5–5.0)
Alkaline Phosphatase: 98 U/L (ref 38–126)
Anion gap: 16 — ABNORMAL HIGH (ref 5–15)
BUN: 13 mg/dL (ref 6–20)
CHLORIDE: 93 mmol/L — AB (ref 101–111)
CO2: 21 mmol/L — AB (ref 22–32)
Calcium: 9.6 mg/dL (ref 8.9–10.3)
Creatinine, Ser: 0.52 mg/dL (ref 0.44–1.00)
GFR calc Af Amer: >60 mL/min (ref >60)
Glucose, Bld: 147 mg/dL — ABNORMAL HIGH (ref 65–99)
POTASSIUM: 3.8 mmol/L (ref 3.5–5.1)
SODIUM: 130 mmol/L — AB (ref 135–145)
Total Bilirubin: 0.9 mg/dL (ref 0.3–1.2)
Total Protein: 8.7 g/dL — ABNORMAL HIGH (ref 6.5–8.1)

## 2017-08-28 LAB — CBC WITH DIFFERENTIAL/PLATELET
BASOS ABS: 0 10*3/uL (ref 0.0–0.1)
Basophils Relative: 0 %
EOS ABS: 0 10*3/uL (ref 0.0–0.7)
EOS PCT: 0 %
HCT: 42.6 % (ref 36.0–46.0)
Hemoglobin: 13.9 g/dL (ref 12.0–15.0)
Lymphocytes Relative: 11 %
Lymphs Abs: 0.9 10*3/uL (ref 0.7–4.0)
MCH: 26.6 pg (ref 26.0–34.0)
MCHC: 32.6 g/dL (ref 30.0–36.0)
MCV: 81.6 fL (ref 78.0–100.0)
MONO ABS: 0.8 10*3/uL (ref 0.1–1.0)
Monocytes Relative: 9 %
Neutro Abs: 7.1 10*3/uL (ref 1.7–7.7)
Neutrophils Relative %: 80 %
PLATELETS: 381 10*3/uL (ref 150–400)
RBC: 5.22 MIL/uL — ABNORMAL HIGH (ref 3.87–5.11)
RDW: 14.2 % (ref 11.5–15.5)
WBC: 8.8 10*3/uL (ref 4.0–10.5)

## 2017-08-28 MED ORDER — SODIUM CHLORIDE 0.9 % IV BOLUS (SEPSIS)
500.0000 mL | Freq: Once | INTRAVENOUS | Status: AC
  Administered 2017-08-28: 500 mL via INTRAVENOUS

## 2017-08-28 NOTE — ED Provider Notes (Signed)
[facility] EMERGENCY DEPARTMENT Provider Note   CSN: [id number] Arrival date & time: 08/28/17  1445     History   Chief Complaint Chief Complaint  Patient presents with  . Emesis    HPI Anna Hanna is a 83 y.o. female.  The history is provided by the patient and a relative. No language interpreter was used.  Emesis   This is a new problem. The current episode started 12 to 24 hours ago. The problem occurs 2 to 4 times per day. The problem has not changed since onset.There has been no fever. Pertinent negatives include no cough and no URI. Risk factors include ill contacts.  Pt's daughter reports pt has had vomiting and diarrhea today.  Pt resides at [facility].  Pt also complains of knee problems.  Pt's daughter reports knee pain is chronic.  Pt has not had any falls.  No fever.   Past Medical History:  Diagnosis Date  . Glaucoma   . Hypercholesteremia   . Hypertension   . Hypokalemia   . Status post total right knee replacement     Patient Active Problem List   Diagnosis Date Noted  . Sepsis due to Enterococcus (HCC) 12/14/2016  . Pressure injury of skin 12/13/2016  . Acute metabolic encephalopathy 12/13/2016  . Sepsis due to undetermined organism (HCC) 12/13/2016  . Fall 12/12/2016  . Living accommodation issues 12/12/2016  . Altered mental status 12/12/2016  . Decubitus ulcer of right ischium, unstageable (HCC)   . UTI (urinary tract infection) 12/03/2016  . UTI (urinary tract infection) due to urinary indwelling catheter (HCC) 12/02/2016  . Benign essential HTN 12/02/2016  . Rhabdomyolysis 09/30/2016  . Immobility 09/30/2016  . Hypokalemia 09/30/2016  . Pressure ulcer, stage 2 09/30/2016  . Acute renal failure (HCC) 09/30/2016  . Hypothermia 09/30/2016  . 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 Hyponatremia 04/02/2016  . Hyperglycemia 04/02/2016  . Femur fracture, right  (HCC) 04/02/2016  . Osteoarthritis of right knee 03/30/2016  . Arthritis of knee, right     Past Surgical History:  Procedure Laterality Date  . ABDOMINAL HYSTERECTOMY    . CATARACT EXTRACTION    . COLONOSCOPY N/A 12/10/2013   Procedure: COLONOSCOPY;  Surgeon: West Bali, MD;  Location: [facility];  Service: Endoscopy;  Laterality: N/A;  8:30 AM  . TOTAL KNEE ARTHROPLASTY Right 03/30/2016   Procedure: TOTAL KNEE ARTHROPLASTY;  Surgeon: Vickki Hearing, MD;  Location: [facility];  Service: Orthopedics;  Laterality: Right;    OB History    No data available       Home Medications    Prior to Admission medications   Medication Sig Start Date End Date Taking? Authorizing Provider  acetaminophen (TYLENOL) 325 MG tablet Take 650 mg by mouth 3 (three) times daily.     [provider]  Amino Acids-Protein Hydrolys (FEEDING SUPPLEMENT, PRO-STAT SUGAR FREE 64,) LIQD Take 30 mLs by mouth 2 (two) times daily.    [provider]  calcium citrate (CALCITRATE - DOSED IN MG ELEMENTAL CALCIUM) 950 MG tablet Take 200 mg of elemental calcium by mouth daily.    [provider]  iron polysaccharides (NIFEREX) 150 MG capsule Take 150 mg by mouth daily.    [provider]  latanoprost (XALATAN) 0.005 % ophthalmic solution Place 1 drop into both eyes at bedtime.    [provider]  linezolid (ZYVOX) 600 MG tablet  Take 1 tablet (600 mg total) by mouth every 12 (twelve) hours. 12/14/16   Catarina Hartshornat, David, MD  mupirocin ointment (BACTROBAN) 2 % Place 1 application into the nose 2 (two) times daily. X 5 days 12/14/16   Catarina Hartshornat, David, MD  Potassium Chloride ER 20 MEQ TBCR Take 20 mEq by mouth daily at 6 (six) AM.    [provider]  zinc oxide 20 % ointment Apply topically 2 (two) times daily. To buttocks and prn soilage 12/14/16   Catarina Hartshornat, David, MD    Family History Family History  Problem Relation Age of Onset  . Cancer Mother   . Arthritis Mother   .  Hypotension Father   . Cancer Sister   . Hypotension Sister   . Colon cancer Neg Hx     Social History Social History   Tobacco Use  . Smoking status: Current Some Day Smoker    Packs/day: 0.25    Years: 3.00    Pack years: 0.75    Types: Cigarettes  . Smokeless tobacco: Never Used  Substance Use Topics  . Alcohol use: No  . Drug use: No     Allergies   Patient has no known allergies.   Review of Systems Review of Systems  Respiratory: Negative for cough.   Gastrointestinal: Positive for vomiting.  All other systems reviewed and are negative.    Physical Exam Updated Vital Signs BP (!) 135/94   Pulse 89   Temp 99 F (37.2 C) (Oral)   Resp 18   Ht 5\' 4"  (1.626 m)   Wt 86.2 kg (190 lb)   SpO2 97%   BMI 32.61 kg/m   Physical Exam  Constitutional: She is oriented to person, place, and time. She appears well-developed and well-nourished.  HENT:  Head: Normocephalic.  Right Ear: External ear normal.  Left Ear: External ear normal.  Mouth/Throat: Oropharynx is clear and moist.  Eyes: EOM are normal.  Neck: Normal range of motion.  Cardiovascular: Normal rate and regular rhythm.  Pulmonary/Chest: Effort normal.  Abdominal: She exhibits no distension.  Musculoskeletal: Normal range of motion.  Incision right leg, well healed,   Neurological: She is alert and oriented to person, place, and time.  Skin: Skin is warm.  Psychiatric: She has a normal mood and affect.  Nursing note and vitals reviewed.    ED Treatments / Results  Labs (all labs ordered are listed, but only abnormal results are displayed) Labs Reviewed  CBC WITH DIFFERENTIAL/PLATELET - Abnormal; Notable for the following components:      Result Value   RBC 5.22 (*)    All other components within normal limits  COMPREHENSIVE METABOLIC PANEL - Abnormal; Notable for the following components:   Sodium 130 (*)    Chloride 93 (*)    CO2 21 (*)    Glucose, Bld 147 (*)    Total Protein 8.7 (*)     Anion gap 16 (*)    All other components within normal limits  URINALYSIS, ROUTINE W REFLEX MICROSCOPIC - Abnormal; Notable for the following components:   APPearance HAZY (*)    Protein, ur 100 (*)    Bacteria, UA RARE (*)    Squamous Epithelial / LPF 0-5 (*)    All other components within normal limits    EKG  EKG Interpretation None       Radiology No results found.  Procedures Procedures (including critical care time)  Medications Ordered in ED Medications  sodium chloride 0.9 % bolus  500 mL (0 mLs Intravenous Stopped 08/28/17 2012)     Initial Impression / Assessment and Plan / ED Course  I have reviewed the triage vital signs and the nursing notes.  Pertinent labs & imaging results that were available during my care of the patient were reviewed by me and considered in my medical decision making (see chart for details).    MDM UA is normal.  Pt in ED for 11 hours no vomiting no diarrhea.  Pt given Iv fluids.  No fever.  I feel pt can return to nursing home.     Final Clinical Impressions(s) / ED Diagnoses   Final diagnoses:  Nausea vomiting and diarrhea    ED Discharge Orders    None    An After Visit Summary was printed and given to the patient.    Elson Areas, Cordelia Poche 08/28/17 2043    Samuel Jester, DO 08/29/17 1710

## 2017-08-28 NOTE — Discharge Instructions (Signed)
Return if any problems.

## 2017-08-28 NOTE — ED Triage Notes (Signed)
Pt sent from [facility] for vomiting x 2 days.

## 2017-12-24 ENCOUNTER — Other Ambulatory Visit (HOSPITAL_COMMUNITY): Payer: Self-pay | Admitting: Orthopaedic Surgery

## 2017-12-27 ENCOUNTER — Other Ambulatory Visit (HOSPITAL_COMMUNITY): Payer: Self-pay | Admitting: Orthopaedic Surgery

## 2017-12-27 DIAGNOSIS — T8484XD Pain due to internal orthopedic prosthetic devices, implants and grafts, subsequent encounter: Secondary | ICD-10-CM

## 2017-12-27 DIAGNOSIS — M25561 Pain in right knee: Secondary | ICD-10-CM

## 2017-12-27 DIAGNOSIS — G8929 Other chronic pain: Secondary | ICD-10-CM

## 2017-12-27 DIAGNOSIS — Z96651 Presence of right artificial knee joint: Principal | ICD-10-CM

## 2018-01-14 ENCOUNTER — Ambulatory Visit (HOSPITAL_COMMUNITY): Admission: RE | Admit: 2018-01-14 | Payer: Medicare HMO | Source: Ambulatory Visit

## 2018-02-05 ENCOUNTER — Ambulatory Visit (HOSPITAL_COMMUNITY)
Admission: RE | Admit: 2018-02-05 | Discharge: 2018-02-05 | Disposition: A | Discharge status: Home / Self Care | Payer: Medicare HMO | Source: Ambulatory Visit | Attending: Orthopaedic Surgery | Admitting: Orthopaedic Surgery

## 2018-02-05 DIAGNOSIS — M25561 Pain in right knee: Secondary | ICD-10-CM | POA: Insufficient documentation

## 2018-02-05 DIAGNOSIS — Z96651 Presence of right artificial knee joint: Secondary | ICD-10-CM | POA: Insufficient documentation

## 2018-02-05 DIAGNOSIS — G8929 Other chronic pain: Secondary | ICD-10-CM | POA: Diagnosis present

## 2018-02-05 DIAGNOSIS — T8484XD Pain due to internal orthopedic prosthetic devices, implants and grafts, subsequent encounter: Secondary | ICD-10-CM

## 2018-03-26 ENCOUNTER — Emergency Department (HOSPITAL_COMMUNITY)
Admission: EM | Admit: 2018-03-26 | Discharge: 2018-03-26 | Disposition: A | Discharge status: Home / Self Care | Payer: Medicare HMO | Attending: Emergency Medicine | Admitting: Emergency Medicine

## 2018-03-26 ENCOUNTER — Emergency Department (HOSPITAL_COMMUNITY): Payer: Medicare HMO

## 2018-03-26 ENCOUNTER — Other Ambulatory Visit: Payer: Self-pay

## 2018-03-26 DIAGNOSIS — I10 Essential (primary) hypertension: Secondary | ICD-10-CM | POA: Insufficient documentation

## 2018-03-26 DIAGNOSIS — F17210 Nicotine dependence, cigarettes, uncomplicated: Secondary | ICD-10-CM | POA: Insufficient documentation

## 2018-03-26 DIAGNOSIS — Z96651 Presence of right artificial knee joint: Secondary | ICD-10-CM | POA: Insufficient documentation

## 2018-03-26 DIAGNOSIS — R0902 Hypoxemia: Secondary | ICD-10-CM | POA: Insufficient documentation

## 2018-03-26 DIAGNOSIS — W19XXXA Unspecified fall, initial encounter: Secondary | ICD-10-CM

## 2018-03-26 DIAGNOSIS — R102 Pelvic and perineal pain: Secondary | ICD-10-CM | POA: Insufficient documentation

## 2018-03-26 MED ORDER — ACETAMINOPHEN 500 MG PO TABS
1000.0000 mg | ORAL_TABLET | Freq: Once | ORAL | Status: AC
  Administered 2018-03-26: 1000 mg via ORAL
  Filled 2018-03-26: qty 2

## 2018-03-26 NOTE — ED Notes (Addendum)
Staff from [facility] was able to arrange pick up for pt, staff here to transport pt, pt assisted to wheelchair and discharge paperwork given to staff member with pt, pt had urinated on herself, RN offered to change pt and pt refused,

## 2018-03-26 NOTE — ED Provider Notes (Signed)
Emergency Department Provider Note   I have reviewed the triage vital signs and the nursing notes.   HISTORY  Chief Complaint Fall   HPI Anna Hanna is a 83 y.o. female with PMH of HLD, HTN, and baseline immobility to the emergency department for evaluation after a fall.  The patient states that she slid out of her wheelchair and landed on her buttocks.  She denies any lightheadedness or chest pain prior to falling.  She has baseline pain in bilateral knees but states this is not worse since falling.  She adamantly denies any head trauma or loss of consciousness.  According to paramedics the patient had low oxygen saturation on scene to the mid 80s which is atypical for her. Denies any current pain (including chest pain) or SOB.    Past Medical History:  Diagnosis Date  . Glaucoma   . Hypercholesteremia   . Hypertension   . Hypokalemia   . Status post total right knee replacement     Patient Active Problem List   Diagnosis Date Noted  . Sepsis due to Enterococcus (HCC) 12/14/2016  . Pressure injury of skin 12/13/2016  . Acute metabolic encephalopathy 12/13/2016  . Sepsis due to undetermined organism (HCC) 12/13/2016  . Fall 12/12/2016  . Living accommodation issues 12/12/2016  . Altered mental status 12/12/2016  . Decubitus ulcer of right ischium, unstageable (HCC)   . UTI (urinary tract infection) 12/03/2016  . UTI (urinary tract infection) due to urinary indwelling catheter (HCC) 12/02/2016  . Benign essential HTN 12/02/2016  . Rhabdomyolysis 09/30/2016  . Immobility 09/30/2016  . Hypokalemia 09/30/2016  . Pressure ulcer, stage 2 09/30/2016  . Acute renal failure (HCC) 09/30/2016  . Hypothermia 09/30/2016  . 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 Hyponatremia 04/02/2016  . Hyperglycemia 04/02/2016  . Femur fracture, right (HCC) 04/02/2016  . Osteoarthritis of right knee 03/30/2016  .  Arthritis of knee, right     Past Surgical History:  Procedure Laterality Date  . ABDOMINAL HYSTERECTOMY    . CATARACT EXTRACTION    . COLONOSCOPY N/A 12/10/2013   Procedure: COLONOSCOPY;  Surgeon: West Bali, MD;  Location: [facility];  Service: Endoscopy;  Laterality: N/A;  8:30 AM  . TOTAL KNEE ARTHROPLASTY Right 03/30/2016   Procedure: TOTAL KNEE ARTHROPLASTY;  Surgeon: Vickki Hearing, MD;  Location: [facility];  Service: Orthopedics;  Laterality: Right;    Allergies Patient has no known allergies.  Family History  Problem Relation Age of Onset  . Cancer Mother   . Arthritis Mother   . Hypotension Father   . Cancer Sister   . Hypotension Sister   . Colon cancer Neg Hx     Social History Social History   Tobacco Use  . Smoking status: Current Some Day Smoker    Packs/day: 0.25    Years: 3.00    Pack years: 0.75    Types: Cigarettes  . Smokeless tobacco: Never Used  Substance Use Topics  . Alcohol use: No  . Drug use: No    Review of Systems  Constitutional: No fever/chills Eyes: No visual changes. ENT: No sore throat. Cardiovascular: Denies chest pain. Respiratory: Denies shortness of breath. Hypoxemia per EMS (resolved).  Gastrointestinal: No abdominal pain. No nausea, no vomiting. No diarrhea. No constipation. Genitourinary: Negative for dysuria. Musculoskeletal: Negative for back pain. Baseline LE pain bilaterally.  Skin: Negative for rash. Neurological: Negative for headaches, focal weakness or  numbness.  10-point ROS otherwise negative.  ____________________________________________   PHYSICAL EXAM:  VITAL SIGNS: ED Triage Vitals  Enc Vitals Group     BP 03/26/18 1545 (!) 134/59     Pulse Rate 03/26/18 1545 93     Resp 03/26/18 1545 14     Temp 03/26/18 1545 98.7 F (37.1 C)     Temp Source 03/26/18 1545 Oral     SpO2 03/26/18 1545 98 %     Weight 03/26/18 1548 190 lb (86.2 kg)     Height 03/26/18 1548 5\' 5"  (1.651 m)     Pain Score  03/26/18 1548 0   Constitutional: Alert and oriented. Well appearing and in no acute distress. Eyes: Conjunctivae are normal. Head: Atraumatic. Nose: No congestion/rhinnorhea. Mouth/Throat: Mucous membranes are moist.   Neck: No stridor. No cervical spine tenderness to palpation. Cardiovascular: Normal rate, regular rhythm. Good peripheral circulation. Grossly normal heart sounds.   Respiratory: Normal respiratory effort.  No retractions. Lungs CTAB. Gastrointestinal: Soft and nontender. No distention.  Musculoskeletal: Baseline flexion of the right knee with mild pain to palpation of right and left knees. No pain with ROM of the bilateral hips.  Neurologic:  Normal speech and language. No gross focal neurologic deficits are appreciated.  Skin:  Skin is warm, dry and intact. No rash noted.  ____________________________________________  RADIOLOGY  Dg Chest 2 View  Result Date: 03/26/2018 CLINICAL DATA:  Fall.  Hypoxia. EXAM: CHEST - 2 VIEW COMPARISON:  Chest x-ray dated Dec 12, 2016. FINDINGS: The patient remains slightly rotated to the right. Stable cardiomediastinal silhouette. Normal pulmonary vascularity. Slightly coarsened interstitial markings are similar to prior study. Mild subsegmental atelectasis at the left lung base. No focal consolidation, pleural effusion, or pneumothorax. Unchanged eventration of the right hemidiaphragm. No acute osseous abnormality. IMPRESSION: Left lower lobe atelectasis.  No active cardiopulmonary disease. Electronically Signed   By: Obie Dredge M.D.   On: 03/26/2018 16:54   Dg Pelvis 1-2 Views  Result Date: 03/26/2018 CLINICAL DATA:  Fall. EXAM: PELVIS - 1-2 VIEW COMPARISON:  Pelvic x-rays dated September 30, 2016. FINDINGS: There is no evidence of pelvic fracture or diastasis. No pelvic bone lesions are seen. 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 Diffuse osteopenia. Soft tissues are unremarkable. IMPRESSION: No acute  osseous abnormality. Electronically Signed   By: Obie Dredge M.D.   On: 03/26/2018 16:56    ____________________________________________   PROCEDURES  Procedure(s) performed:   Procedures  None ____________________________________________   INITIAL IMPRESSION / ASSESSMENT AND PLAN / ED COURSE  Pertinent labs & imaging results that were available during my care of the patient were reviewed by me and considered in my medical decision making (see chart for details).  Patient presents to the emergency department for evaluation after a fall from a wheelchair.  The patient is nonambulatory at baseline.  She landed on her buttocks.  She has chronic pain in the lower extremity is not worse since the fall.  No cervical spine tenderness or evidence of head trauma.  Patient was found to have low oxygen saturation with EMS into the mid 80s and was placed on 2 L nasal cannula.  Here, patient is 97% on room air with no respiratory distress.  She denies any shortness of breath or chest pain.   05:00 PM Plain films reviewed without acute findings. Plan for discharge back to nursing facility and PCP follow up if hypoxemia returns. Has had normal O2 sat here throughout ED stay.  At this time, I do not feel there is any life-threatening condition present. I have reviewed and discussed all results (EKG, imaging, lab, urine as appropriate), exam findings with patient. I have reviewed nursing notes and appropriate previous records.  I feel the patient is safe to be discharged home without further emergent workup. Discussed usual and customary return precautions. Patient and family (if present) verbalize understanding and are comfortable with this plan.  Patient will follow-up with their primary care provider. If they do not have a primary care provider, information for follow-up has been provided to them. All questions have been answered.  ____________________________________________  FINAL CLINICAL  IMPRESSION(S) / ED DIAGNOSES  Final diagnoses:  Fall, initial encounter  Pelvic pain  Hypoxemia    MEDICATIONS GIVEN DURING THIS VISIT:  Medications  acetaminophen (TYLENOL) tablet 1,000 mg (1,000 mg Oral Given 03/26/18 1612)    Note:  This document was prepared using Dragon voice recognition software and may include unintentional dictation errors.  Alona Bene, MD Emergency Medicine    Jerimie Mancuso, Arlyss Repress, MD 03/26/18 [phone]

## 2018-03-26 NOTE — ED Triage Notes (Signed)
Pt fell at her rest home. Is wheelchair bound and non ambulatory. States she does not hurt anywhere. O2 sats 82-85% on RA. Is not normally on any O2. 95% on 2L.

## 2018-03-26 NOTE — Discharge Instructions (Signed)
You were seen in the ED today with pain after a fall. Your oxygen numbers here were normal and your chest x-ray and pelvis x-rays were also normal. Return to the ED with any worsening pain, shortness of breath, return of hypoxemia, or chest pain.

## 2018-03-26 NOTE — ED Notes (Signed)
Charge RN contacted [facility] to their facility, Trudy at facility advised that pt is able to turn when sitting up in bed and turn to wheelchair with assistance, RN advised that staff would help pt in the car,

## 2019-07-28 ENCOUNTER — Other Ambulatory Visit: Payer: Self-pay

## 2019-07-28 ENCOUNTER — Encounter (HOSPITAL_COMMUNITY): Payer: Self-pay | Admitting: Emergency Medicine

## 2019-07-28 ENCOUNTER — Emergency Department (HOSPITAL_COMMUNITY)
Admission: EM | Admit: 2019-07-28 | Discharge: 2019-07-28 | Disposition: A | Discharge status: Home / Self Care | Payer: Medicare HMO | Attending: Emergency Medicine | Admitting: Emergency Medicine

## 2019-07-28 ENCOUNTER — Emergency Department (HOSPITAL_COMMUNITY): Payer: Medicare HMO

## 2019-07-28 DIAGNOSIS — M25561 Pain in right knee: Secondary | ICD-10-CM | POA: Insufficient documentation

## 2019-07-28 DIAGNOSIS — L981 Factitial dermatitis: Secondary | ICD-10-CM | POA: Insufficient documentation

## 2019-07-28 DIAGNOSIS — S30811A Abrasion of abdominal wall, initial encounter: Secondary | ICD-10-CM

## 2019-07-28 DIAGNOSIS — W06XXXA Fall from bed, initial encounter: Secondary | ICD-10-CM | POA: Insufficient documentation

## 2019-07-28 MED ORDER — NYSTATIN-TRIAMCINOLONE 100000-0.1 UNIT/GM-% EX CREA: TOPICAL_CREAM | CUTANEOUS | 0 refills | Status: DC

## 2019-07-28 MED ORDER — HYDROCODONE-ACETAMINOPHEN 5-325 MG PO TABS
1.0000 | ORAL_TABLET | Freq: Once | ORAL | Status: AC
  Administered 2019-07-28: 08:00:00 1 via ORAL
  Filled 2019-07-28: qty 1

## 2019-07-28 NOTE — Discharge Instructions (Signed)
Your x-rays today show chronic changes related to prior injury and surgery but nothing that looks like a serious acute surgery.   Use the prescribed cream for the rash in your groin region and try not to itch it the best you can.

## 2019-07-28 NOTE — ED Provider Notes (Addendum)
MSE was initiated and I personally evaluated the patient and placed orders (if any) at  6:18 AM on July 28, 2019.  The patient appears stable so that the remainder of the MSE may be completed by another provider.  Patient states she slid out of her bed today at her nursing facility.  She denies hitting her head, getting knocked out, back pain, upper extremity pain.  She gives a confusing history that she had a right knee replacement done many years ago which appeared to be 2017 and that she has been unable to straighten her leg since although she did tell me she could straighten it before she fell out of bed tonight.  She then states she has not been able to straighten it.    Patient does not appear to be in any distress.  She appears to be alert and oriented.  She is in no respiratory distress.  She moves her arms well.  She is noted to have her right knee flexed.  She has surgical scar and a large indentation laterally.  I feel a round firm mass in the proximal right lower leg medially that she was unaware of.  There is no bruising seen there is no joint effusion seen.  Her joint space appears to be wider than normal.  X-ray of her right knee was obtained, from what I gather there is probably nothing acute, her nursing facility wanted her checked after a slip out of bed.  Patient also describes that she has irritation from chronically wearing the diaper and she has white discharge and itching.  In and out urine was ordered.  Devoria Albe, MD, Concha Pyo, MD 07/28/19 2683    Devoria Albe, MD 07/28/19 [phone]

## 2019-07-28 NOTE — ED Triage Notes (Signed)
Pt arrived via [facility] after unwitnessed fall. Pt c/o right leg/knee pain. Pt leg is contracted at baseline

## 2019-08-04 DIAGNOSIS — H409 Unspecified glaucoma: Secondary | ICD-10-CM | POA: Insufficient documentation

## 2019-08-04 DIAGNOSIS — Z9181 History of falling: Secondary | ICD-10-CM | POA: Insufficient documentation

## 2019-08-04 DIAGNOSIS — M62461 Contracture of muscle, right lower leg: Secondary | ICD-10-CM | POA: Insufficient documentation

## 2019-08-04 DIAGNOSIS — M1611 Unilateral primary osteoarthritis, right hip: Secondary | ICD-10-CM | POA: Insufficient documentation

## 2019-08-05 DIAGNOSIS — G8220 Paraplegia, unspecified: Secondary | ICD-10-CM | POA: Insufficient documentation

## 2019-08-05 DIAGNOSIS — F17210 Nicotine dependence, cigarettes, uncomplicated: Secondary | ICD-10-CM | POA: Insufficient documentation

## 2019-08-05 DIAGNOSIS — B9620 Unspecified Escherichia coli [E. coli] as the cause of diseases classified elsewhere: Secondary | ICD-10-CM | POA: Insufficient documentation

## 2019-08-05 DIAGNOSIS — Z993 Dependence on wheelchair: Secondary | ICD-10-CM | POA: Insufficient documentation

## 2019-08-10 ENCOUNTER — Encounter (HOSPITAL_COMMUNITY): Payer: Self-pay | Admitting: Emergency Medicine

## 2019-08-10 ENCOUNTER — Emergency Department (HOSPITAL_COMMUNITY): Payer: Medicare HMO

## 2019-08-10 ENCOUNTER — Other Ambulatory Visit: Payer: Self-pay

## 2019-08-10 ENCOUNTER — Emergency Department (HOSPITAL_COMMUNITY)
Admission: EM | Admit: 2019-08-10 | Discharge: 2019-08-10 | Disposition: A | Discharge status: Home / Self Care | Payer: Medicare HMO | Attending: Emergency Medicine | Admitting: Emergency Medicine

## 2019-08-10 DIAGNOSIS — I10 Essential (primary) hypertension: Secondary | ICD-10-CM | POA: Diagnosis not present

## 2019-08-10 DIAGNOSIS — Z79899 Other long term (current) drug therapy: Secondary | ICD-10-CM | POA: Insufficient documentation

## 2019-08-10 DIAGNOSIS — R52 Pain, unspecified: Secondary | ICD-10-CM

## 2019-08-10 DIAGNOSIS — F17210 Nicotine dependence, cigarettes, uncomplicated: Secondary | ICD-10-CM | POA: Diagnosis not present

## 2019-08-10 DIAGNOSIS — M25561 Pain in right knee: Secondary | ICD-10-CM | POA: Diagnosis not present

## 2019-08-10 DIAGNOSIS — G8929 Other chronic pain: Secondary | ICD-10-CM | POA: Insufficient documentation

## 2019-08-10 LAB — URINALYSIS, ROUTINE W REFLEX MICROSCOPIC
Bilirubin Urine: NEGATIVE
Glucose, UA: NEGATIVE mg/dL
Hgb urine dipstick: NEGATIVE
Ketones, ur: 20 mg/dL — AB
Leukocytes,Ua: NEGATIVE
Nitrite: NEGATIVE
Protein, ur: NEGATIVE mg/dL
Specific Gravity, Urine: 1.024 (ref 1.005–1.030)
pH: 5 (ref 5.0–8.0)

## 2019-08-10 MED ORDER — HYDROCODONE-ACETAMINOPHEN 5-325 MG PO TABS: 1.0000 | ORAL_TABLET | Freq: Four times a day (QID) | ORAL | 0 refills | Status: DC | PRN

## 2019-08-10 NOTE — ED Notes (Signed)
Call to [facility] for report  She is informed copy of UA coming back w pt and rx for pain meds- She asks that Rx be sent to [facility]  Dr Estell Harpin informed

## 2019-08-10 NOTE — ED Provider Notes (Signed)
[facility] EMERGENCY DEPARTMENT Provider Note   CSN: [id number] Arrival date & time: 08/10/19  1011     History Chief Complaint  Patient presents with  . Knee Pain    Anna Hanna is a 85 y.o. female.  Patient was sent over here from the assisted living for chronic knee pain.  Patient has a knee prosthesis and a chronically flexed knee.  She takes Motrin for pain  The history is provided by the patient. No language interpreter was used.  Knee Pain Pain details:    Quality:  Aching   Radiates to:  Does not radiate   Severity:  Moderate   Onset quality:  Gradual   Timing:  Constant   Progression:  Worsening Chronicity:  Chronic Associated symptoms: no back pain and no fatigue        Past Medical History:  Diagnosis Date  . Glaucoma   . Hypercholesteremia   . Hypertension   . Hypokalemia   . Status post total right knee replacement     Patient Active Problem List   Diagnosis Date Noted  . Sepsis due to Enterococcus (HCC) 12/14/2016  . Pressure injury of skin 12/13/2016  . Acute metabolic encephalopathy 12/13/2016  . Sepsis due to undetermined organism (HCC) 12/13/2016  . Fall 12/12/2016  . Living accommodation issues 12/12/2016  . Altered mental status 12/12/2016  . Decubitus ulcer of right ischium, unstageable (HCC)   . UTI (urinary tract infection) 12/03/2016  . UTI (urinary tract infection) due to urinary indwelling catheter (HCC) 12/02/2016  . Benign essential HTN 12/02/2016  . Rhabdomyolysis 09/30/2016  . Immobility 09/30/2016  . Hypokalemia 09/30/2016  . Pressure ulcer, stage 2 (HCC) 09/30/2016  . Acute renal failure (HCC) 09/30/2016  . Hypothermia 09/30/2016  . 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 Hyponatremia 04/02/2016  . Hyperglycemia 04/02/2016  . Femur fracture, right (HCC) 04/02/2016  . Osteoarthritis of right knee 03/30/2016  . Arthritis of knee, right      Past Surgical History:  Procedure Laterality Date  . ABDOMINAL HYSTERECTOMY    . CATARACT EXTRACTION    . COLONOSCOPY N/A 12/10/2013   Procedure: COLONOSCOPY;  Surgeon: West Bali, MD;  Location: [facility];  Service: Endoscopy;  Laterality: N/A;  8:30 AM  . TOTAL KNEE ARTHROPLASTY Right 03/30/2016   Procedure: TOTAL KNEE ARTHROPLASTY;  Surgeon: Vickki Hearing, MD;  Location: [facility];  Service: Orthopedics;  Laterality: Right;     OB History   No obstetric history on file.     Family History  Problem Relation Age of Onset  . Cancer Mother   . Arthritis Mother   . Hypotension Father   . Cancer Sister   . Hypotension Sister   . Colon cancer Neg Hx     Social History   Tobacco Use  . Smoking status: Current Some Day Smoker    Packs/day: 0.25    Years: 3.00    Pack years: 0.75    Types: Cigarettes  . Smokeless tobacco: Never Used  Substance Use Topics  . Alcohol use: No  . Drug use: No    Home Medications Prior to Admission medications   Medication Sig Start Date End Date Taking? Authorizing Provider  acetaminophen (TYLENOL) 325 MG tablet Take 650 mg by mouth 3 (three) times daily.     [provider]  Amino Acids-Protein Hydrolys (FEEDING SUPPLEMENT, PRO-STAT SUGAR FREE 64,) LIQD Take 30 mLs by mouth  2 (two) times daily.    [provider]  calcium citrate (CALCITRATE - DOSED IN MG ELEMENTAL CALCIUM) 950 MG tablet Take 200 mg of elemental calcium by mouth daily.    [provider]  HYDROcodone-acetaminophen (NORCO/VICODIN) 5-325 MG tablet Take 1 tablet by mouth every 6 (six) hours as needed. 08/10/19   Milton Ferguson, MD  iron polysaccharides (NIFEREX) 150 MG capsule Take 150 mg by mouth daily.    [provider]  latanoprost (XALATAN) 0.005 % ophthalmic solution Place 1 drop into both eyes at bedtime.    [provider]  linezolid (ZYVOX) 600 MG tablet Take 1 tablet (600 mg total) by mouth every 12 (twelve) hours.  12/14/16   Orson Eva, MD  mupirocin ointment (BACTROBAN) 2 % Place 1 application into the nose 2 (two) times daily. X 5 days 12/14/16   Orson Eva, MD  nystatin-triamcinolone [facility] II) cream Apply to affected area daily 07/28/19   Virgel Manifold, MD  Potassium Chloride ER 20 MEQ TBCR Take 20 mEq by mouth daily at 6 (six) AM.    [provider]  zinc oxide 20 % ointment Apply topically 2 (two) times daily. To buttocks and prn soilage 12/14/16   Orson Eva, MD    Allergies    Patient has no known allergies.  Review of Systems   Review of Systems  Constitutional: Negative for appetite change and fatigue.  HENT: Negative for congestion, ear discharge and sinus pressure.   Eyes: Negative for discharge.  Respiratory: Negative for cough.   Cardiovascular: Negative for chest pain.  Gastrointestinal: Negative for abdominal pain and diarrhea.  Genitourinary: Negative for frequency and hematuria.  Musculoskeletal: Negative for back pain.       Right knee pain  Skin: Negative for rash.  Neurological: Negative for seizures and headaches.  Psychiatric/Behavioral: Negative for hallucinations.    Physical Exam Updated Vital Signs BP (!) 144/82 (BP Location: Right Arm)   Pulse 99   Temp 98.2 F (36.8 C) (Oral)   Resp 18   SpO2 98%   Physical Exam Vitals and nursing note reviewed.  Constitutional:      Appearance: She is well-developed.  HENT:     Head: Normocephalic.     Nose: Nose normal.  Eyes:     Conjunctiva/sclera: Conjunctivae normal.  Neck:     Trachea: No tracheal deviation.  Cardiovascular:     Rate and Rhythm: Normal rate.     Heart sounds: No murmur.  Abdominal:     General: Bowel sounds are normal.  Musculoskeletal:        General: Normal range of motion.     Comments: Collect right needed tender and swollen  Skin:    General: Skin is warm.     ED Results / Procedures / Treatments   Labs (all labs ordered are listed, but only abnormal results are  displayed) Labs Reviewed - No data to display  EKG None  Radiology DG Knee 1-2 Views Right  Result Date: 08/10/2019 CLINICAL DATA:  Right knee is fixated in flexed position. EXAM: RIGHT KNEE - 1-2 VIEW COMPARISON:  July 28, 2019 FINDINGS: The right knee is fixated in flexed position. 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 There is no evidence of acute fracture or dislocation. Extensive bony remodeling and heterotopic ossifications are seen along the distal femur and the knee joint, similar to the prior studies. The patella is flattened and misshapen. There is  no evidence of joint effusion. IMPRESSION: 1. Post right knee arthroplasty with the knee fixated in flexed position. 2. Extensive bony remodeling and heterotopic ossifications along the distal femur and the knee joint, stable from prior studies. Electronically Signed   By: Ted Mcalpine M.D.   On: 08/10/2019 11:16    Procedures Procedures (including critical care time)  Medications Ordered in ED Medications - No data to display  ED Course  I have reviewed the triage vital signs and the nursing notes.  Pertinent labs & imaging results that were available during my care of the patient were reviewed by me and considered in my medical decision making (see chart for details).    MDM Rules/Calculators/A&P                      Chronic knee pain from prosthesis that is chronically flexed.  Patient is taking Motrin.  We will give her Vicodin and have her follow-up with her orthopedic Final Clinical Impression(s) / ED Diagnoses Final diagnoses:  Chronic pain of right knee    Rx / DC Orders ED Discharge Orders         Ordered    HYDROcodone-acetaminophen (NORCO/VICODIN) 5-325 MG tablet  Every 6 hours PRN     08/10/19 1127           Bethann Berkshire, MD 08/10/19 1132

## 2019-08-10 NOTE — ED Notes (Signed)
Anna Hanna, POC called and asked if our physician would write an order for skilled care   She is informed that her provider has to order same   Pt is returning   Then  PA from facility called and asked Dr Estell Harpin if he would write due to staff unable or unwilling to assist pt to wheelchair Dr Estell Harpin oper his remarks to this RN declined

## 2019-08-10 NOTE — ED Triage Notes (Signed)
Pt from [facility] assisted living. Has chronic right knee pain. Has been on meds x 2 days with no relief. Nad. A/o

## 2019-08-10 NOTE — ED Notes (Signed)
[facility] called to set up transport for pt back to facility.

## 2019-08-10 NOTE — ED Notes (Signed)
[facility] called back to reset up transport for pt back to facility.

## 2019-08-10 NOTE — ED Notes (Signed)
urine spec obtained and to the lab

## 2019-08-10 NOTE — ED Notes (Signed)
Call to [facility] POC  Pt has been on Nitrofurantoin /Mono MCR 100 mg  1 tab Q6 since Friday

## 2019-08-10 NOTE — Discharge Instructions (Addendum)
Follow-up with your family doctor or Dr. Romeo Apple for continued knee problems

## 2019-08-10 NOTE — ED Notes (Signed)
Call to [facility] to give report  [phone]  Selena Batten, POC states pt was sent here for UTI sx  Is on meds but is not improving

## 2019-08-10 NOTE — ED Notes (Signed)
[employer]  [phone]

## 2019-09-03 ENCOUNTER — Inpatient Hospital Stay (HOSPITAL_COMMUNITY)
Admission: EM | Admit: 2019-09-03 | Discharge: 2019-09-06 | DRG: 872 | Disposition: A | Discharge status: Skilled Nursing Facility | Payer: Medicare HMO | Attending: Internal Medicine | Admitting: Internal Medicine

## 2019-09-03 ENCOUNTER — Emergency Department (HOSPITAL_COMMUNITY): Payer: Medicare HMO

## 2019-09-03 ENCOUNTER — Other Ambulatory Visit: Payer: Self-pay

## 2019-09-03 ENCOUNTER — Encounter (HOSPITAL_COMMUNITY): Payer: Self-pay

## 2019-09-03 DIAGNOSIS — F17210 Nicotine dependence, cigarettes, uncomplicated: Secondary | ICD-10-CM | POA: Diagnosis present

## 2019-09-03 DIAGNOSIS — H409 Unspecified glaucoma: Secondary | ICD-10-CM | POA: Diagnosis present

## 2019-09-03 DIAGNOSIS — R112 Nausea with vomiting, unspecified: Secondary | ICD-10-CM | POA: Diagnosis present

## 2019-09-03 DIAGNOSIS — Z90710 Acquired absence of both cervix and uterus: Secondary | ICD-10-CM | POA: Diagnosis not present

## 2019-09-03 DIAGNOSIS — R52 Pain, unspecified: Secondary | ICD-10-CM

## 2019-09-03 DIAGNOSIS — A499 Bacterial infection, unspecified: Secondary | ICD-10-CM

## 2019-09-03 DIAGNOSIS — G8220 Paraplegia, unspecified: Secondary | ICD-10-CM | POA: Diagnosis present

## 2019-09-03 DIAGNOSIS — E871 Hypo-osmolality and hyponatremia: Secondary | ICD-10-CM | POA: Diagnosis present

## 2019-09-03 DIAGNOSIS — E7800 Pure hypercholesterolemia, unspecified: Secondary | ICD-10-CM | POA: Diagnosis present

## 2019-09-03 DIAGNOSIS — N10 Acute tubulo-interstitial nephritis: Secondary | ICD-10-CM | POA: Diagnosis present

## 2019-09-03 DIAGNOSIS — E861 Hypovolemia: Secondary | ICD-10-CM | POA: Diagnosis present

## 2019-09-03 DIAGNOSIS — R6520 Severe sepsis without septic shock: Secondary | ICD-10-CM | POA: Diagnosis present

## 2019-09-03 DIAGNOSIS — F0150 Vascular dementia without behavioral disturbance: Secondary | ICD-10-CM

## 2019-09-03 DIAGNOSIS — L89892 Pressure ulcer of other site, stage 2: Secondary | ICD-10-CM | POA: Diagnosis present

## 2019-09-03 DIAGNOSIS — L8992 Pressure ulcer of unspecified site, stage 2: Secondary | ICD-10-CM | POA: Diagnosis present

## 2019-09-03 DIAGNOSIS — Z20822 Contact with and (suspected) exposure to covid-19: Secondary | ICD-10-CM | POA: Diagnosis present

## 2019-09-03 DIAGNOSIS — N179 Acute kidney failure, unspecified: Secondary | ICD-10-CM | POA: Diagnosis present

## 2019-09-03 DIAGNOSIS — Z1612 Extended spectrum beta lactamase (ESBL) resistance: Secondary | ICD-10-CM | POA: Diagnosis present

## 2019-09-03 DIAGNOSIS — M25571 Pain in right ankle and joints of right foot: Secondary | ICD-10-CM | POA: Diagnosis present

## 2019-09-03 DIAGNOSIS — Z993 Dependence on wheelchair: Secondary | ICD-10-CM

## 2019-09-03 DIAGNOSIS — Z96651 Presence of right artificial knee joint: Secondary | ICD-10-CM | POA: Diagnosis present

## 2019-09-03 DIAGNOSIS — N12 Tubulo-interstitial nephritis, not specified as acute or chronic: Secondary | ICD-10-CM

## 2019-09-03 DIAGNOSIS — F0390 Unspecified dementia without behavioral disturbance: Secondary | ICD-10-CM | POA: Diagnosis present

## 2019-09-03 DIAGNOSIS — Z79899 Other long term (current) drug therapy: Secondary | ICD-10-CM

## 2019-09-03 DIAGNOSIS — A419 Sepsis, unspecified organism: Secondary | ICD-10-CM | POA: Diagnosis present

## 2019-09-03 DIAGNOSIS — I10 Essential (primary) hypertension: Secondary | ICD-10-CM | POA: Diagnosis present

## 2019-09-03 DIAGNOSIS — Z7409 Other reduced mobility: Secondary | ICD-10-CM | POA: Diagnosis present

## 2019-09-03 HISTORY — DX: Anemia, unspecified: D64.9

## 2019-09-03 HISTORY — DX: Type 2 diabetes mellitus without complications: E11.9

## 2019-09-03 HISTORY — DX: Paraplegia, unspecified: G82.20

## 2019-09-03 LAB — COMPREHENSIVE METABOLIC PANEL
ALT: 16 U/L (ref 0–44)
AST: 20 U/L (ref 15–41)
Albumin: 3.9 g/dL (ref 3.5–5.0)
Alkaline Phosphatase: 66 U/L (ref 38–126)
Anion gap: 14 (ref 5–15)
BUN: 28 mg/dL — ABNORMAL HIGH (ref 8–23)
CO2: 23 mmol/L (ref 22–32)
Calcium: 9.1 mg/dL (ref 8.9–10.3)
Chloride: 90 mmol/L — ABNORMAL LOW (ref 98–111)
Creatinine, Ser: 0.95 mg/dL (ref 0.44–1.00)
GFR calc Af Amer: >60 mL/min (ref >60)
GFR calc non Af Amer: 56 mL/min — ABNORMAL LOW (ref >60)
Glucose, Bld: 161 mg/dL — ABNORMAL HIGH (ref 70–99)
Potassium: 4.1 mmol/L (ref 3.5–5.1)
Sodium: 127 mmol/L — ABNORMAL LOW (ref 135–145)
Total Bilirubin: 0.9 mg/dL (ref 0.3–1.2)
Total Protein: 7.5 g/dL (ref 6.5–8.1)

## 2019-09-03 LAB — LIPASE, BLOOD: Lipase: 20 U/L (ref 11–51)

## 2019-09-03 LAB — CBC WITH DIFFERENTIAL/PLATELET
Abs Immature Granulocytes: 0.06 10*3/uL (ref 0.00–0.07)
Basophils Absolute: 0 10*3/uL (ref 0.0–0.1)
Basophils Relative: 0 %
Eosinophils Absolute: 0 10*3/uL (ref 0.0–0.5)
Eosinophils Relative: 0 %
HCT: 43.6 % (ref 36.0–46.0)
Hemoglobin: 13.6 g/dL (ref 12.0–15.0)
Immature Granulocytes: 1 %
Lymphocytes Relative: 7 %
Lymphs Abs: 0.8 10*3/uL (ref 0.7–4.0)
MCH: 26.2 pg (ref 26.0–34.0)
MCHC: 31.2 g/dL (ref 30.0–36.0)
MCV: 83.8 fL (ref 80.0–100.0)
Monocytes Absolute: 0.8 10*3/uL (ref 0.1–1.0)
Monocytes Relative: 7 %
Neutro Abs: 10.3 10*3/uL — ABNORMAL HIGH (ref 1.7–7.7)
Neutrophils Relative %: 85 %
Platelets: 430 10*3/uL — ABNORMAL HIGH (ref 150–400)
RBC: 5.2 MIL/uL — ABNORMAL HIGH (ref 3.87–5.11)
RDW: 15.4 % (ref 11.5–15.5)
WBC: 11.9 10*3/uL — ABNORMAL HIGH (ref 4.0–10.5)
nRBC: 0 % (ref 0.0–0.2)

## 2019-09-03 LAB — URINALYSIS, ROUTINE W REFLEX MICROSCOPIC
Bilirubin Urine: NEGATIVE
Glucose, UA: NEGATIVE mg/dL
Hgb urine dipstick: NEGATIVE
Ketones, ur: 20 mg/dL — AB
Nitrite: NEGATIVE
Protein, ur: 30 mg/dL — AB
Specific Gravity, Urine: 1.028 (ref 1.005–1.030)
pH: 7 (ref 5.0–8.0)

## 2019-09-03 LAB — RESPIRATORY PANEL BY RT PCR (FLU A&B, COVID)
Influenza A by PCR: NEGATIVE
Influenza B by PCR: NEGATIVE
SARS Coronavirus 2 by RT PCR: NEGATIVE

## 2019-09-03 LAB — PROTIME-INR
INR: 1.1 (ref 0.8–1.2)
Prothrombin Time: 13.7 seconds (ref 11.4–15.2)

## 2019-09-03 LAB — LACTIC ACID, PLASMA
Lactic Acid, Venous: 1.6 mmol/L (ref 0.5–1.9)
Lactic Acid, Venous: 1.7 mmol/L (ref 0.5–1.9)

## 2019-09-03 LAB — APTT: aPTT: 24 seconds (ref 24–36)

## 2019-09-03 MED ORDER — SODIUM CHLORIDE 0.9 % IV BOLUS
1000.0000 mL | Freq: Once | INTRAVENOUS | Status: AC
  Administered 2019-09-03: 1000 mL via INTRAVENOUS

## 2019-09-03 MED ORDER — ACETAMINOPHEN 650 MG RE SUPP: 650.0000 mg | Freq: Four times a day (QID) | RECTAL | Status: DC | PRN

## 2019-09-03 MED ORDER — SODIUM CHLORIDE 0.9 % IV SOLN
1.0000 g | Freq: Three times a day (TID) | INTRAVENOUS | Status: DC
  Administered 2019-09-03 – 2019-09-04 (×2): 1 g via INTRAVENOUS
  Filled 2019-09-03 (×2): qty 1

## 2019-09-03 MED ORDER — POLYETHYLENE GLYCOL 3350 17 G PO PACK: 17.0000 g | PACK | Freq: Every day | ORAL | Status: DC | PRN

## 2019-09-03 MED ORDER — IOHEXOL 300 MG/ML  SOLN
100.0000 mL | Freq: Once | INTRAMUSCULAR | Status: AC | PRN
  Administered 2019-09-03: 100 mL via INTRAVENOUS

## 2019-09-03 MED ORDER — ONDANSETRON HCL 4 MG PO TABS: 4.0000 mg | ORAL_TABLET | Freq: Four times a day (QID) | ORAL | Status: DC | PRN

## 2019-09-03 MED ORDER — SODIUM CHLORIDE 0.9 % IV SOLN
2.0000 g | Freq: Once | INTRAVENOUS | Status: AC
  Administered 2019-09-03: 2 g via INTRAVENOUS
  Filled 2019-09-03: qty 20

## 2019-09-03 MED ORDER — METRONIDAZOLE IN NACL 5-0.79 MG/ML-% IV SOLN
500.0000 mg | Freq: Once | INTRAVENOUS | Status: AC
  Administered 2019-09-03: 500 mg via INTRAVENOUS
  Filled 2019-09-03: qty 100

## 2019-09-03 MED ORDER — ACETAMINOPHEN 325 MG PO TABS
650.0000 mg | ORAL_TABLET | Freq: Four times a day (QID) | ORAL | Status: DC | PRN
  Administered 2019-09-06: 650 mg via ORAL
  Filled 2019-09-03: qty 2

## 2019-09-03 MED ORDER — OXYBUTYNIN CHLORIDE ER 5 MG PO TB24
10.0000 mg | ORAL_TABLET | Freq: Every day | ORAL | Status: DC
  Administered 2019-09-04 – 2019-09-06 (×3): 10 mg via ORAL
  Filled 2019-09-03 (×4): qty 2

## 2019-09-03 MED ORDER — ONDANSETRON HCL 4 MG/2ML IJ SOLN: 4.0000 mg | Freq: Four times a day (QID) | INTRAMUSCULAR | Status: DC | PRN

## 2019-09-03 MED ORDER — ENOXAPARIN SODIUM 40 MG/0.4ML ~~LOC~~ SOLN
40.0000 mg | SUBCUTANEOUS | Status: DC
  Administered 2019-09-03 – 2019-09-05 (×3): 40 mg via SUBCUTANEOUS
  Filled 2019-09-03 (×3): qty 0.4

## 2019-09-03 NOTE — ED Triage Notes (Signed)
Ems says pt from [facility].  Reports diagnosed with UTI and started antibiotics 3 days ago.  Reports n/v x 2 days.  RN also reports open wound to r inner thigh.

## 2019-09-03 NOTE — ED Provider Notes (Signed)
[facility] Emergency Department Provider Note MRN:  [id number]  Arrival date & time: 09/03/19     Chief Complaint   Emesis   History of Present Illness   Anna Hanna is a 85 y.o. year-old female with a history of paraplegia, hypertension presenting to the ED with chief complaint of emesis.  Nausea vomiting since last night.  History of dementia.  I was unable to obtain an accurate HPI, PMH, or ROS due to the patient's dementia and or altered mental status.  Level 5 caveat.  Review of Systems  Positive for vomiting, malaise, abdominal pain.  Patient's Health History    Past Medical History:  Diagnosis Date  . Anemia   . Glaucoma   . Hypercholesteremia   . Hypertension   . Hypokalemia   . Paraplegia ([place])   . Status post total right knee replacement     Past Surgical History:  Procedure Laterality Date  . ABDOMINAL HYSTERECTOMY    . CATARACT EXTRACTION    . COLONOSCOPY N/A 12/10/2013   Procedure: COLONOSCOPY;  Surgeon: Danie Binder, MD;  Location: [facility] ENDO SUITE;  Service: Endoscopy;  Laterality: N/A;  8:30 AM  . TOTAL KNEE ARTHROPLASTY Right 03/30/2016   Procedure: TOTAL KNEE ARTHROPLASTY;  Surgeon: Carole Civil, MD;  Location: [facility] ORS;  Service: Orthopedics;  Laterality: Right;    Family History  Problem Relation Age of Onset  . Cancer Mother   . Arthritis Mother   . Hypotension Father   . Cancer Sister   . Hypotension Sister   . Colon cancer Neg Hx     Social History   Socioeconomic History  . Marital status: Divorced    Spouse name: Not on file  . Number of children: Not on file  . Years of education: Not on file  . Highest education level: Not on file  Occupational History  . Not on file  Tobacco Use  . Smoking status: Current Some Day Smoker    Packs/day: 0.25    Years: 3.00    Pack years: 0.75    Types: Cigarettes  . Smokeless tobacco: Never Used  Substance and Sexual Activity  . Alcohol use: No  . Drug use: No    . Sexual activity: Not Currently    Birth control/protection: Post-menopausal  Other Topics Concern  . Not on file  Social History Narrative  . Not on file   Social Determinants of Health   Financial Resource Strain:   . Difficulty of Paying Living Expenses: Not on file  Food Insecurity:   . Worried About Charity fundraiser in the Last Year: Not on file  . Ran Out of Food in the Last Year: Not on file  Transportation Needs:   . Lack of Transportation (Medical): Not on file  . Lack of Transportation (Non-Medical): Not on file  Physical Activity:   . Days of Exercise per Week: Not on file  . Minutes of Exercise per Session: Not on file  Stress:   . Feeling of Stress : Not on file  Social Connections:   . Frequency of Communication with Friends and Family: Not on file  . Frequency of Social Gatherings with Friends and Family: Not on file  . Attends Religious Services: Not on file  . Active Member of Clubs or Organizations: Not on file  . Attends Archivist Meetings: Not on file  . Marital Status: Not on file  Intimate Partner Violence:   . Fear of  Current or Ex-Partner: Not on file  . Emotionally Abused: Not on file  . Physically Abused: Not on file  . Sexually Abused: Not on file     Physical Exam   Vitals:   09/03/19 1430 09/03/19 1500  BP:  118/69  Pulse: (!) 116 (!) 123  Resp: (!) 23 (!) 22  Temp:    SpO2: 98% 98%    CONSTITUTIONAL: Chronically ill-appearing, NAD NEURO: Awake, alert, oriented to name only, right-sided deficits EYES:  eyes equal and reactive ENT/NECK:  no LAD, no JVD CARDIO: Tachycardic rate, well-perfused, normal S1 and S2 PULM:  CTAB no wheezing or rhonchi GI/GU:  normal bowel sounds, non-distended, moderate right upper quadrant tenderness to palpation MSK/SPINE:  No gross deformities, no edema SKIN:  no rash, atraumatic PSYCH:  Appropriate speech and behavior  *Additional and/or pertinent findings included in MDM  below  Diagnostic and Interventional Summary    EKG Interpretation  Date/Time:  Wednesday September 03 2019 11:07:18 EST Ventricular Rate:  130 PR Interval:    QRS Duration: 90 QT Interval:  303 QTC Calculation: 446 R Axis:   66 Text Interpretation: Sinus tachycardia Consider left ventricular hypertrophy Nonspecific T abnormalities, diffuse leads Baseline wander in lead(s) V6 Confirmed by Kennis Carina [phone]) on 09/03/2019 12:05:10 PM      Cardiac Monitoring Interpretation:  Labs Reviewed  CBC WITH DIFFERENTIAL/PLATELET - Abnormal; Notable for the following components:      Result Value   WBC 11.9 (*)    RBC 5.20 (*)    Platelets 430 (*)    Neutro Abs 10.3 (*)    All other components within normal limits  COMPREHENSIVE METABOLIC PANEL - Abnormal; Notable for the following components:   Sodium 127 (*)    Chloride 90 (*)    Glucose, Bld 161 (*)    BUN 28 (*)    GFR calc non Af Amer 56 (*)    All other components within normal limits  URINALYSIS, ROUTINE W REFLEX MICROSCOPIC - Abnormal; Notable for the following components:   Color, Urine AMBER (*)    APPearance CLOUDY (*)    Ketones, ur 20 (*)    Protein, ur 30 (*)    Leukocytes,Ua MODERATE (*)    Bacteria, UA MANY (*)    All other components within normal limits  CULTURE, BLOOD (ROUTINE X 2)  RESPIRATORY PANEL BY RT PCR (FLU A&B, COVID)  CULTURE, BLOOD (ROUTINE X 2)  URINE CULTURE  LIPASE, BLOOD  LACTIC ACID, PLASMA  LACTIC ACID, PLASMA  APTT  PROTIME-INR    CT ABDOMEN PELVIS W CONTRAST  Final Result    DG Chest Port 1 View  Final Result      Medications  cefTRIAXone (ROCEPHIN) 2 g in sodium chloride 0.9 % 100 mL IVPB (0 g Intravenous Stopped 09/03/19 1218)  metroNIDAZOLE (FLAGYL) IVPB 500 mg (0 mg Intravenous Stopped 09/03/19 1334)  sodium chloride 0.9 % bolus 1,000 mL (1,000 mLs Intravenous New Bag/Given 09/03/19 1330)  iohexol (OMNIPAQUE) 300 MG/ML solution 100 mL (100 mLs Intravenous Contrast Given  09/03/19 1448)     Procedures  /  Critical Care .Critical Care Performed by: Sabas Sous, MD Authorized by: Sabas Sous, MD   Critical care provider statement:    Critical care time (minutes):  34   Critical care was necessary to treat or prevent imminent or life-threatening deterioration of the following conditions:  Sepsis   Critical care was time spent personally by me on the following activities:  Discussions with consultants, evaluation of patient's response to treatment, examination of patient, ordering and performing treatments and interventions, ordering and review of laboratory studies, ordering and review of radiographic studies, pulse oximetry, re-evaluation of patient's condition, obtaining history from patient or surrogate and review of old charts    ED Course and Medical Decision Making  I have reviewed the triage vital signs, the nursing notes, and pertinent available records from the EMR.  Pertinent labs & imaging results that were available during my care of the patient were reviewed by me and considered in my medical decision making (see below for details).     Hyperthermic orally, tachycardic to the 130s, tender right upper quadrant, questionably confused.  Code sepsis initiated, CT imaging to exclude cholecystitis.  Labs are overall reassuring, patient continues to be mildly tachycardic but normotensive.  Urinalysis consistent with infection despite 3 days of outpatient antibiotics.  CT does not reveal acute emergent process.  Likely pyelonephritis.  Will admit for urosepsis.  Elmer Sow. Pilar Plate, MD [facility] Emergency Medicine [facility] [email or web address]  Final Clinical Impressions(s) / ED Diagnoses     ICD-10-CM   1. Pyelonephritis  N12   2. Sepsis, due to unspecified organism, unspecified whether acute organ dysfunction present [facility])  A41.9     ED Discharge Orders    None       Discharge Instructions Discussed with and Provided  to Patient:   Discharge Instructions   None       Sabas Sous, MD 09/03/19 1529

## 2019-09-03 NOTE — Progress Notes (Signed)
Pharmacy Antibiotic Note  Anna Hanna is a 85 y.o. female admitted on 09/03/2019 with sepsis.  Pharmacy has been consulted for merrem dosing.  Plan: Merrem 1gm IV q8h F/U cxs and clinical progress Monitor V/S, labs   Weight: 200 lb (90.7 kg)  Temp (24hrs), Avg:99.1 F (37.3 C), Min:98.1 F (36.7 C), Max:99.9 F (37.7 C)  Recent Labs  Lab 09/03/19 1130 09/03/19 1136 09/03/19 1330  WBC  --  11.9*  --   CREATININE  --  0.95  --   LATICACIDVEN 1.6  --  1.7    Estimated Creatinine Clearance: 50.8 mL/min (by C-G formula based on SCr of 0.95 mg/dL).    No Known Allergies  Antimicrobials this admission: merrem 2/10>>  Ceftriaxone 2gm IV a 1 ED 2/10  Dose adjustments this admission: prn  Microbiology results: 2/10 BCx: pending 2/10 UCx: pending  Thank you for allowing pharmacy to be a part of this patient's care.  Elder Cyphers, BS Loura Back, [place] Clinical Pharmacist Pager [phone] 09/03/2019 6:35 PM

## 2019-09-03 NOTE — ED Notes (Signed)
Staff at [facility] updated at this time and informed about patient's admission to hospital this evening.

## 2019-09-03 NOTE — H&P (Signed)
History and Physical    Anna Hanna QPY:[id number] DOB: [birth year] DOA: 09/03/2019  PCP: [name], MD   [facility]  I have personally briefly reviewed patient's old medical records in [facility]  Chief Complaint: Vomiting,   HPI: Anna Hanna is a 85 y.o. female with medical history significant for hypertension, paraplegia, dementia. Was brought to the ED with reports of vomiting.  Patient was started on antibiotics Bactrim for UTI 3 days ago.  History is obtained from chart review and from nursing home staff , as at the time of my evaluation the patient is awake and alert, she answers some questions appropriately but at times it is hard to understand what she is saying.  She does reports that she has chronic weakness of her lower extremities, and has a wound to her right upper inner thigh. Per nursing staff, patient was lying in bed for most of the day, but there has been no change in her baseline dementia.  UA was obtained and was suggestive of UTI, but a urine culture was not obtained.   ED Course: This, tachypneic, blood pressure systolic 102-151, O2 sats  >  12% on room air.  WBC 11.9.  Sodium 127.  Lactic acid 1.6 > 1.7.  UA with moderate leukocytes, many bacteria, 21-50 WBCs. 7.  Creatinine mildly elevated at 0.95.  Portable chest x-ray negative for acute abnormality.  CT abdomen pelvis with contrast -possible pyelonephritis, no fluid collection.  No CT findings of acute cholecystitis.  Started on IV ceftriaxone.  Review of Systems: As per HPI all other systems reviewed and negative.  Past Medical History:  Diagnosis Date  . Anemia   . Glaucoma   . Hypercholesteremia   . Hypertension   . Hypokalemia   . Paraplegia ([facility])   . Status post total right knee replacement     Past Surgical History:  Procedure Laterality Date  . ABDOMINAL HYSTERECTOMY    . CATARACT EXTRACTION    . COLONOSCOPY N/A 12/10/2013   Procedure: COLONOSCOPY;   Surgeon: West Bali, MD;  Location[facility];  Service: Endoscopy;  Laterality: N/A;  8:30 AM  . TOTAL KNEE ARTHROPLASTY Right 03/30/2016   Procedure: TOTAL KNEE ARTHROPLASTY;  Surgeon: Vickki Hearing, MD;  Location: [facility] ORS;  Service: Orthopedics;  Laterality: Right;     reports that she has been smoking cigarettes. She has a 0.75 pack-year smoking history. She has never used smokeless tobacco. She reports that she does not drink alcohol or use drugs.  No Known Allergies  Family History  Problem Relation Age of Onset  . Cancer Mother   . Arthritis Mother   . Hypotension Father   . Cancer Sister   . Hypotension Sister   . Colon cancer Neg Hx     Prior to Admission medications   Medication Sig Start Date End Date Taking? Authorizing Provider  acetaminophen (TYLENOL) 325 MG tablet Take 650 mg by mouth 3 (three) times daily.    Yes [provider]  diclofenac Sodium (VOLTAREN) 1 % GEL Apply 2-4 g topically 3 (three) times daily. 04/29/19  Yes [provider]  iron polysaccharides (FERREX 150) 150 MG capsule Take 1 capsule by mouth daily. 04/11/16  Yes [provider]  nitrofurantoin (MACRODANTIN) 100 MG capsule Take 100 mg by mouth every 6 (six) hours. 08/07/19  Yes [provider]  oxybutynin (DITROPAN-XL) 10 MG 24 hr tablet Take 10 mg by mouth daily. 08/30/19  Yes [provider]  sulfamethoxazole-trimethoprim (BACTRIM DS) 800-160 MG tablet Take 1 tablet by mouth 2 (two) times daily. 08/30/19  Yes [provider]  TRAVATAN Z 0.004 % SOLN ophthalmic solution Place 1 drop into both eyes at bedtime. 09/01/19  Yes [provider]  vitamin C (ASCORBIC ACID) 250 MG tablet Take 500 mg by mouth daily.   Yes [provider]  zinc oxide 20 % ointment Apply topically 2 (two) times daily. To buttocks and prn soilage 12/14/16  Yes Tat, David, MD  Amino Acids-Protein Hydrolys (FEEDING SUPPLEMENT, PRO-STAT SUGAR FREE 64,) LIQD Take  30 mLs by mouth 2 (two) times daily.    [provider]  calcium citrate (CALCITRATE - DOSED IN MG ELEMENTAL CALCIUM) 950 MG tablet Take 200 mg of elemental calcium by mouth daily.    [provider]  latanoprost (XALATAN) 0.005 % ophthalmic solution Place 1 drop into both eyes at bedtime.    [provider]  mupirocin ointment (BACTROBAN) 2 % Place 1 application into the nose 2 (two) times daily. X 5 days Patient not taking: Reported on 09/03/2019 12/14/16   Catarina Hartshorn, MD  naproxen (NAPROSYN) 500 MG tablet Take 1 tablet by mouth every 12 (twelve) hours. 1 tab every 12 hours as needed for arthritis at the knee 08/13/19   [provider]  nystatin-triamcinolone (MYCOLOG II) cream Apply to affected area daily Patient not taking: Reported on 09/03/2019 07/28/19   Raeford Razor, MD  Potassium Chloride ER 20 MEQ TBCR Take 20 mEq by mouth daily at 6 (six) AM.    [provider]    Physical Exam: Vitals:   09/03/19 1530 09/03/19 1600 09/03/19 1630 09/03/19 1737  BP: 128/75 102/72 121/65 111/65  Pulse: (!) 125 (!) 120 (!) 115 (!) 112  Resp: (!) 24 20 20  (!) 23  Temp:    98.1 F (36.7 C)  TempSrc:    Oral  SpO2: 97% 96% 96% 98%  Weight:        Constitutional: NAD, calm, comfortable Vitals:   09/03/19 1530 09/03/19 1600 09/03/19 1630 09/03/19 1737  BP: 128/75 102/72 121/65 111/65  Pulse: (!) 125 (!) 120 (!) 115 (!) 112  Resp: (!) 24 20 20  (!) 23  Temp:    98.1 F (36.7 C)  TempSrc:    Oral  SpO2: 97% 96% 96% 98%  Weight:       Eyes: PERRL, lids and conjunctivae normal ENMT: Mucous membranes are moist.   Neck: normal, supple, no masses, no thyromegaly Respiratory:  Normal respiratory effort. No accessory muscle use.  Cardiovascular: Regular rate and rhythm, No extremity edema. 2+ pedal pulses. Abdomen: no tenderness, no masses palpated. No hepatosplenomegaly. Bowel sounds positive.  Musculoskeletal: no clubbing / cyanosis.Normal muscle tone.   Limited motion to bilateral lower extremity, R > L.  right lower extremity appears contracted at the knee s/p right knee arthroplasty. Skin: ~ 2cm oval-shaped surface stage II surface ulcer in the upper thigh, does not appear infected, minimal drainage.  Mild surrounding hyperpigmentation without surrounding erythema redness or warmth. Neurologic: No gross cranial nerve abnormality, 4+ out of 5 strength bilateral upper extremity, right lower extremity contracted, left lower extremity 3+ /5.  Sensation intact. Psychiatric: Awake alert and oriented to person, and to some extent place but not time.  Labs on Admission: I have personally reviewed following labs and imaging studies  CBC: Recent Labs  Lab 09/03/19 1136  WBC 11.9*  NEUTROABS 10.3*  HGB 13.6  HCT  43.6  MCV 83.8  PLT 430*   Basic Metabolic Panel: Recent Labs  Lab 09/03/19 1136  NA 127*  K 4.1  CL 90*  CO2 23  GLUCOSE 161*  BUN 28*  CREATININE 0.95  CALCIUM 9.1   Liver Function Tests: Recent Labs  Lab 09/03/19 1136  AST 20  ALT 16  ALKPHOS 66  BILITOT 0.9  PROT 7.5  ALBUMIN 3.9   Recent Labs  Lab 09/03/19 1136  LIPASE 20   Coagulation Profile: Recent Labs  Lab 09/03/19 1130  INR 1.1   Urine analysis:    Component Value Date/Time   COLORURINE AMBER (A) 09/03/2019 1140   APPEARANCEUR CLOUDY (A) 09/03/2019 1140   LABSPEC 1.028 09/03/2019 1140   PHURINE 7.0 09/03/2019 1140   GLUCOSEU NEGATIVE 09/03/2019 1140   HGBUR NEGATIVE 09/03/2019 1140   BILIRUBINUR NEGATIVE 09/03/2019 1140   KETONESUR 20 (A) 09/03/2019 1140   PROTEINUR 30 (A) 09/03/2019 1140   NITRITE NEGATIVE 09/03/2019 1140   LEUKOCYTESUR MODERATE (A) 09/03/2019 1140    Radiological Exams on Admission: CT ABDOMEN PELVIS W CONTRAST  Result Date: 09/03/2019 CLINICAL DATA:  Nausea and vomiting for 2 days. Question sepsis from cholecystitis. Technologist notes state diagnosed with urinary tract infection and started on antibiotics 3 days  ago. EXAM: CT ABDOMEN AND PELVIS WITH CONTRAST TECHNIQUE: Multidetector CT imaging of the abdomen and pelvis was performed using the standard protocol following bolus administration of intravenous contrast. CONTRAST:  OMNIPAQUE IOHEXOL 300 MG/ML  SOLN COMPARISON:  None. FINDINGS: Lower chest: Mild subpleural reticulation and interstitial coarsening. Mild pleural thickening without significant effusion. No confluent airspace disease. There is wall thickening of the distal esophagus. Peripherally calcified 2.2 cm lesion in the left breast/low left axilla is partially included. Hepatobiliary: Possible tiny hypodensity adjacent to the gallbladder fossa, series 2, image 23. Gallbladder is elongated. No evidence of gallbladder wall thickening, calcified gallstone, or pericholecystic inflammation. Upper normal common bile duct at 7 mm. Pancreas: Parenchymal atrophy. No ductal dilatation or inflammation. Spleen: Normal in size without focal abnormality. Adrenals/Urinary Tract: No adrenal nodule. No hydronephrosis. Nephrograms in the both kidneys are slightly heterogeneous. No focal renal lesion or fluid collection.1 urinary bladder is physiologically distended. There is no bladder wall thickening. No perivesicular stranding. Stomach/Bowel: Wall thickening of the distal esophagus. Fluid-filled stomach without gastric wall thickening. Normal positioning of the ligament of Treitz. Detailed bowel assessment is limited in the absence of enteric contrast. Diminutive normal appendix, series 7, image 49. No appendicitis. Moderate stool burden throughout the colon. Mild gaseous distension of transverse colon. There is no colonic wall thickening or inflammation. Vascular/Lymphatic: Aorto bi-iliac atherosclerosis. No aortic aneurysm. Portal vein is patent. No enlarged abdominopelvic lymph nodes. Reproductive: Uterus surgically absent. No adnexal mass. Other: No free air or ascites. No evidence of intra-abdominal fluid  collection. Musculoskeletal: Degenerative change in the lower lumbar spine. Degenerative change of both hips. Trabecular coarsening of the left inferior pubic ramus and ischium. IMPRESSION: 1. Wall thickening of the distal esophagus, suspicious for esophagitis or reflux. 2. Slight heterogeneous nephrograms of both kidneys, may represent pyelonephritis in the setting of recent urinary tract infection. No focal fluid collection. 3. No CT findings of acute cholecystitis. 4. Trabecular coarsening of the left inferior pubic ramus and ischium, may be sequela of prior trauma or Paget's disease. Aortic Atherosclerosis (ICD10-I70.0). Electronically Signed   By: Narda Rutherford M.D.   On: 09/03/2019 15:14   DG Chest Port 1 View  Result Date: 09/03/2019 CLINICAL DATA:  Sepsis. EXAM: PORTABLE CHEST 1 VIEW COMPARISON:  03/26/2018 FINDINGS: The heart size and pulmonary vascularity are normal. No infiltrates or effusions. Chronic peribronchial thickening. No acute bone abnormality. IMPRESSION: No acute abnormality. Electronically Signed   By: Lorriane Shire M.D.   On: 09/03/2019 12:02    EKG: Independently reviewed.  Sinus tachycardia.  QTc 446.  Nonspecific T wave wave changes II, III, avf.   Assessment/Plan Principal Problem:   Acute pyelonephritis Active Problems:   Immobility   Pressure ulcer, stage 2 (HCC)   Benign essential HTN   Sepsis ([place])   Dementia ([place])  Sepsis secondary to pyelonephritis-as suggested on CT abdomen and pelvis with contrast, with tachycardic, tachypneic, Tmax 99.9, leukocytosis of 11.9.  Rules in for sepsis with acute kidney injury but with normal lactic acid 1.6 > 1.7.  Diagnosis of urinary tract infection, started on Bactrim to same 08/30/19.  -Despite Bactrim compliance, patient has progressed and is now septic.  Will start broad-spectrum IV antibiotics with meropenem -De-escalate early with urine cultures -Follow-up blood cultures -BMP, CBC a.m.  Hyponatremia-sodium 127,  baseline 2 years ago 130s to 138.  Likely 2/2 vomiting. - BMP  a.m. - 1 L bolus given, continue N/s 75cc/hr  Acute kidney injury- mild, creatinine 0.95, line 2 years ago 0.5.  Differentials include ATN versus BActrim versus pre-renal.  Contrast exposure today. -IV fluids -BMP a.m.  Hypertension-systolic 161-096E.  Not on antihypertensive agents. -IV fluids  Dementia, Paraplegia-ambulates with wheelchair.  Right knee contractures related to right knee surgery.  Nursing home resident.  Patient's mental status currently at baseline.  Right upper thigh ulcer- stage II.  Does not appear infected.  No surrounding cellulitis. -  Regular wound care.   DVT prophylaxis: Lovenox Code Status: Full code confirmed with nursing home. Family Communication: Called nursing home, talked to nursing home staff. Disposition Plan: > 2 DAYS, likely back to nursing home.  Pending urine cultures, resolution of sepsis physiology. Consults called: None Admission status: inpatient, telemetry I certify that at the point of admission it is my clinical judgment that the patient will require inpatient hospital care spanning beyond 2 midnights from the point of admission due to high intensity of service, high risk for further deterioration and high frequency of surveillance required. The following factors support the patient status of inpatient: UTI with sepsis requiring IV antibiotics.   Bethena Roys MD [facility]  09/03/2019, 6:36 PM

## 2019-09-04 ENCOUNTER — Inpatient Hospital Stay (HOSPITAL_COMMUNITY): Payer: Medicare HMO

## 2019-09-04 DIAGNOSIS — N10 Acute tubulo-interstitial nephritis: Secondary | ICD-10-CM

## 2019-09-04 LAB — BASIC METABOLIC PANEL
Anion gap: 11 (ref 5–15)
BUN: 21 mg/dL (ref 8–23)
CO2: 23 mmol/L (ref 22–32)
Calcium: 8.5 mg/dL — ABNORMAL LOW (ref 8.9–10.3)
Chloride: 96 mmol/L — ABNORMAL LOW (ref 98–111)
Creatinine, Ser: 0.79 mg/dL (ref 0.44–1.00)
GFR calc Af Amer: >60 mL/min (ref >60)
GFR calc non Af Amer: >60 mL/min (ref >60)
Glucose, Bld: 98 mg/dL (ref 70–99)
Potassium: 3.9 mmol/L (ref 3.5–5.1)
Sodium: 130 mmol/L — ABNORMAL LOW (ref 135–145)

## 2019-09-04 LAB — CBC
HCT: 39.1 % (ref 36.0–46.0)
Hemoglobin: 12.3 g/dL (ref 12.0–15.0)
MCH: 26.6 pg (ref 26.0–34.0)
MCHC: 31.5 g/dL (ref 30.0–36.0)
MCV: 84.6 fL (ref 80.0–100.0)
Platelets: 380 10*3/uL (ref 150–400)
RBC: 4.62 MIL/uL (ref 3.87–5.11)
RDW: 15.5 % (ref 11.5–15.5)
WBC: 9.8 10*3/uL (ref 4.0–10.5)
nRBC: 0 % (ref 0.0–0.2)

## 2019-09-04 LAB — URIC ACID: Uric Acid, Serum: 4 mg/dL (ref 2.5–7.1)

## 2019-09-04 MED ORDER — IPRATROPIUM-ALBUTEROL 0.5-2.5 (3) MG/3ML IN SOLN: 3.0000 mL | RESPIRATORY_TRACT | Status: DC | PRN

## 2019-09-04 MED ORDER — SODIUM CHLORIDE 0.9 % IV SOLN
1.0000 g | INTRAVENOUS | Status: DC
  Administered 2019-09-04: 1 g via INTRAVENOUS
  Filled 2019-09-04: qty 10

## 2019-09-04 MED ORDER — SENNOSIDES-DOCUSATE SODIUM 8.6-50 MG PO TABS: 2.0000 | ORAL_TABLET | Freq: Every evening | ORAL | Status: DC | PRN

## 2019-09-04 MED ORDER — SODIUM CHLORIDE 0.9 % IV SOLN: INTRAVENOUS | Status: DC

## 2019-09-04 NOTE — Progress Notes (Addendum)
PROGRESS NOTE    Anna Hanna  PZW:[id number] DOB: 1934/10/29 DOA: 09/03/2019 PCP: [name], MD   Brief Narrative:  85 year old with history of HTN, paraplegia, dementia was diagnosed with UTI outpatient and started on Bactrim 3 days ago.  History was difficult to obtain but patient was noted to be slightly confused with episode of vomiting at the nursing home therefore sent to the hospital.  UA was suggestive of urinary tract infection.  CT of the abdomen pelvis with contrast concerns for pyelonephritis   Assessment & Plan:   Principal Problem:   Acute pyelonephritis Active Problems:   Immobility   Pressure ulcer, stage 2 (HCC)   Benign essential HTN   Sepsis (HCC)   Dementia (HCC)  Sepsis secondary to acute pyelonephritis -Stop Bactrim, on meropenem, will transition to Rocephin.  Follow-up culture data. -CT abdomen pelvis with contrast shows pyelonephritis -Supportive care.  IV fluids  Mild renal insufficiency, prerenal Hypovolemic hyponatremia -IV fluids.  Monitor sodium levels  Right ankle pain -No gross deformities seen.  No evidence of active infection.  Will order right ankle x-ray, check uric acid levels -No need for PT OT, as she is wheelchair-bound  Essential hypertension -Diet controlled  History of dementia/paraplegia -Usually wheelchair-bound.  Resides at nursing home.  Mentation at baseline  Stage II right upper thigh ulcer -No evidence of active infection.  Routine wound care    DVT prophylaxis: Lovenox Code Status: Full code Family Communication:   Disposition Plan:   Patient From= [facility] nursing home  Patient Anticipated D/C place= [facility] nursing home  Barriers= despite of being on oral Bactrim she clearly failed p.o. treatment and became septic.  She continues to remain in the hospital for IV antibiotic and monitoring of further culture data.  Also has clear signs of poor oral intake and dehydration therefore requires IV  fluids   Subjective: Feels a lot better, still having some abdominal and flank discomfort with feeling of nausea but improved compared to yesterday.  Review of Systems Otherwise negative except as per HPI, including: General: Denies fever, chills, night sweats or unintended weight loss. Resp: Denies cough, wheezing, shortness of breath. Cardiac: Denies chest pain, palpitations, orthopnea, paroxysmal nocturnal dyspnea. GI: Denies abdominal pain, nausea, vomiting, diarrhea or constipation GU: Denies dysuria, frequency, hesitancy or incontinence MS: Denies muscle aches, joint pain or swelling Neuro: Denies headache, neurologic deficits (focal weakness, numbness, tingling), abnormal gait Psych: Denies anxiety, depression, SI/HI/AVH Skin: Denies new rashes or lesions ID: Denies sick contacts, exotic exposures, travel  Examination:  General exam: Appears calm and comfortable, elderly frail Respiratory system: Clear to auscultation. Respiratory effort normal. Cardiovascular system: S1 & S2 heard, RRR. No JVD, murmurs, rubs, gallops or clicks. No pedal edema. Gastrointestinal system: Abdomen is nondistended, soft and nontender. No organomegaly or masses felt. Normal bowel sounds heard. Central nervous system: Alert and oriented. No focal neurological deficits. Extremities: Symmetric 5 x 5 power. Skin: No rashes, lesions or ulcers Psychiatry: Judgement and insight appear normal. Mood & affect appropriate.     Objective: Vitals:   09/03/19 1737 09/03/19 2005 09/03/19 2038 09/04/19 0700  BP: 111/65 101/65  (!) 153/93  Pulse: (!) 112 (!) 107  76  Resp: (!) 23 20  20   Temp: 98.1 F (36.7 C)   98 F (36.7 C)  TempSrc: Oral     SpO2: 98% 100% 96% 99%  Weight:  81.4 kg    Height:  5\' 5"  (1.651 m)      Intake/Output  Summary (Last 24 hours) at 09/04/2019 0816 Last data filed at 09/04/2019 0600 Gross per 24 hour  Intake 1525.64 ml  Output 300 ml  Net 1225.64 ml   Filed Weights    09/03/19 1058 09/03/19 2005  Weight: 90.7 kg 81.4 kg     Data Reviewed:   CBC: Recent Labs  Lab 09/03/19 1136 09/04/19 0539  WBC 11.9* 9.8  NEUTROABS 10.3*  --   HGB 13.6 12.3  HCT 43.6 39.1  MCV 83.8 84.6  PLT 430* 380   Basic Metabolic Panel: Recent Labs  Lab 09/03/19 1136 09/04/19 0539  NA 127* 130*  K 4.1 3.9  CL 90* 96*  CO2 23 23  GLUCOSE 161* 98  BUN 28* 21  CREATININE 0.95 0.79  CALCIUM 9.1 8.5*   GFR: Estimated Creatinine Clearance: 57.2 mL/min (by C-G formula based on SCr of 0.79 mg/dL). Liver Function Tests: Recent Labs  Lab 09/03/19 1136  AST 20  ALT 16  ALKPHOS 66  BILITOT 0.9  PROT 7.5  ALBUMIN 3.9   Recent Labs  Lab 09/03/19 1136  LIPASE 20   No results for input(s): AMMONIA in the last 168 hours. Coagulation Profile: Recent Labs  Lab 09/03/19 1130  INR 1.1   Cardiac Enzymes: No results for input(s): CKTOTAL, CKMB, CKMBINDEX, TROPONINI in the last 168 hours. BNP (last 3 results) No results for input(s): PROBNP in the last 8760 hours. HbA1C: No results for input(s): HGBA1C in the last 72 hours. CBG: No results for input(s): GLUCAP in the last 168 hours. Lipid Profile: No results for input(s): CHOL, HDL, LDLCALC, TRIG, CHOLHDL, LDLDIRECT in the last 72 hours. Thyroid Function Tests: No results for input(s): TSH, T4TOTAL, FREET4, T3FREE, THYROIDAB in the last 72 hours. Anemia Panel: No results for input(s): VITAMINB12, FOLATE, FERRITIN, TIBC, IRON, RETICCTPCT in the last 72 hours. Sepsis Labs: Recent Labs  Lab 09/03/19 1130 09/03/19 1330  LATICACIDVEN 1.6 1.7    Recent Results (from the past 240 hour(s))  Blood Culture (routine x 2)     Status: None (Preliminary result)   Collection Time: 09/03/19 11:30 AM   Specimen: BLOOD RIGHT ARM  Result Value Ref Range Status   Specimen Description BLOOD RIGHT ARM DRAWN BY RN  Final   Special Requests   Final    BOTTLES DRAWN AEROBIC AND ANAEROBIC Blood Culture adequate volume    Culture   Final    NO GROWTH < 24 HOURS Performed at [facility], [address], [place], [place] [address]    Report Status PENDING  Incomplete  Respiratory Panel by RT PCR (Flu A&B, Covid) - Urine, Clean Catch     Status: None   Collection Time: 09/03/19 11:32 AM   Specimen: Urine, Clean Catch  Result Value Ref Range Status   SARS Coronavirus 2 by RT PCR NEGATIVE NEGATIVE Final    Comment: (NOTE) SARS-CoV-2 target nucleic acids are NOT DETECTED. The SARS-CoV-2 RNA is generally detectable in upper respiratoy specimens during the acute phase of infection. The lowest concentration of SARS-CoV-2 viral copies this assay can detect is 131 copies/mL. A negative result does not preclude SARS-Cov-2 infection and should not be used as the sole basis for treatment or other patient management decisions. A negative result may occur with  improper specimen collection/handling, submission of specimen other than nasopharyngeal swab, presence of viral mutation(s) within the areas targeted by this assay, and inadequate number of viral copies (<131 copies/mL). A negative result must be combined with clinical observations, patient history,  and epidemiological information. The expected result is Negative. Fact Sheet for Patients:  https://www.moore.com/ Fact Sheet for Healthcare Providers:  https://www.young.biz/ This test is not yet ap proved or cleared by the [place] FDA and  has been authorized for detection and/or diagnosis of SARS-CoV-2 by FDA under an Emergency Use Authorization (EUA). This EUA will remain  in effect (meaning this test can be used) for the duration of the COVID-19 declaration under Section 564(b)(1) of the Act, 21 U.S.C. section 360bbb-3(b)(1), unless the authorization is terminated or revoked sooner.    Influenza A by PCR NEGATIVE NEGATIVE Final   Influenza B by PCR NEGATIVE NEGATIVE Final    Comment: (NOTE) The Xpert Xpress  SARS-CoV-2/FLU/RSV assay is intended as an aid in  the diagnosis of influenza from Nasopharyngeal swab specimens and  should not be used as a sole basis for treatment. Nasal washings and  aspirates are unacceptable for Xpert Xpress SARS-CoV-2/FLU/RSV  testing. Fact Sheet for Patients: https://www.moore.com/ Fact Sheet for Healthcare Providers: https://www.young.biz/ This test is not yet approved or cleared by the [place] FDA and  has been authorized for detection and/or diagnosis of SARS-CoV-2 by  FDA under an Emergency Use Authorization (EUA). This EUA will remain  in effect (meaning this test can be used) for the duration of the  Covid-19 declaration under Section 564(b)(1) of the Act, 21  U.S.C. section 360bbb-3(b)(1), unless the authorization is  terminated or revoked. Performed at [facility], [address] [place], [place] [address]   Blood Culture (routine x 2)     Status: None (Preliminary result)   Collection Time: 09/03/19 11:35 AM   Specimen: BLOOD RIGHT FOREARM  Result Value Ref Range Status   Specimen Description BLOOD RIGHT FOREARM  Final   Special Requests   Final    BOTTLES DRAWN AEROBIC AND ANAEROBIC Blood Culture results may not be optimal due to an inadequate volume of blood received in culture bottles   Culture   Final    NO GROWTH < 24 HOURS Performed at [facility], [address], [place], [place] [address]    Report Status PENDING  Incomplete         Radiology Studies: CT ABDOMEN PELVIS W CONTRAST  Result Date: 09/03/2019 CLINICAL DATA:  Nausea and vomiting for 2 days. Question sepsis from cholecystitis. Technologist notes state diagnosed with urinary tract infection and started on antibiotics 3 days ago. EXAM: CT ABDOMEN AND PELVIS WITH CONTRAST TECHNIQUE: Multidetector CT imaging of the abdomen and pelvis was performed using the standard protocol following bolus administration of intravenous contrast.  CONTRAST:  OMNIPAQUE IOHEXOL 300 MG/ML  SOLN COMPARISON:  None. FINDINGS: Lower chest: Mild subpleural reticulation and interstitial coarsening. Mild pleural thickening without significant effusion. No confluent airspace disease. There is wall thickening of the distal esophagus. Peripherally calcified 2.2 cm lesion in the left breast/low left axilla is partially included. Hepatobiliary: Possible tiny hypodensity adjacent to the gallbladder fossa, series 2, image 23. Gallbladder is elongated. No evidence of gallbladder wall thickening, calcified gallstone, or pericholecystic inflammation. Upper normal common bile duct at 7 mm. Pancreas: Parenchymal atrophy. No ductal dilatation or inflammation. Spleen: Normal in size without focal abnormality. Adrenals/Urinary Tract: No adrenal nodule. No hydronephrosis. Nephrograms in the both kidneys are slightly heterogeneous. No focal renal lesion or fluid collection.1 urinary bladder is physiologically distended. There is no bladder wall thickening. No perivesicular stranding. Stomach/Bowel: Wall thickening of the distal esophagus. Fluid-filled stomach without gastric wall thickening. Normal positioning of the ligament of  Treitz. Detailed bowel assessment is limited in the absence of enteric contrast. Diminutive normal appendix, series 7, image 49. No appendicitis. Moderate stool burden throughout the colon. Mild gaseous distension of transverse colon. There is no colonic wall thickening or inflammation. Vascular/Lymphatic: Aorto bi-iliac atherosclerosis. No aortic aneurysm. Portal vein is patent. No enlarged abdominopelvic lymph nodes. Reproductive: Uterus surgically absent. No adnexal mass. Other: No free air or ascites. No evidence of intra-abdominal fluid collection. Musculoskeletal: Degenerative change in the lower lumbar spine. Degenerative change of both hips. Trabecular coarsening of the left inferior pubic ramus and ischium. IMPRESSION: 1. Wall thickening of the  distal esophagus, suspicious for esophagitis or reflux. 2. Slight heterogeneous nephrograms of both kidneys, may represent pyelonephritis in the setting of recent urinary tract infection. No focal fluid collection. 3. No CT findings of acute cholecystitis. 4. Trabecular coarsening of the left inferior pubic ramus and ischium, may be sequela of prior trauma or Paget's disease. Aortic Atherosclerosis (ICD10-I70.0). Electronically Signed   By: Keith Rake M.D.   On: 09/03/2019 15:14   DG Chest Port 1 View  Result Date: 09/03/2019 CLINICAL DATA:  Sepsis. EXAM: PORTABLE CHEST 1 VIEW COMPARISON:  03/26/2018 FINDINGS: The heart size and pulmonary vascularity are normal. No infiltrates or effusions. Chronic peribronchial thickening. No acute bone abnormality. IMPRESSION: No acute abnormality. Electronically Signed   By: Lorriane Shire M.D.   On: 09/03/2019 12:02        Scheduled Meds: . enoxaparin (LOVENOX) injection  40 mg Subcutaneous Q24H  . oxybutynin  10 mg Oral Daily   Continuous Infusions: . meropenem (MERREM) IV 1 g (09/04/19 0545)     LOS: 1 day   Time spent= 35 mins    Aphrodite Harpenau Arsenio Loader, MD [facility]  If 7PM-7AM, please contact night-coverage  09/04/2019, 8:16 AM

## 2019-09-05 ENCOUNTER — Inpatient Hospital Stay: Payer: Self-pay

## 2019-09-05 DIAGNOSIS — Z1612 Extended spectrum beta lactamase (ESBL) resistance: Secondary | ICD-10-CM

## 2019-09-05 DIAGNOSIS — A499 Bacterial infection, unspecified: Secondary | ICD-10-CM

## 2019-09-05 LAB — URINE CULTURE: Culture: >=100000 — AB

## 2019-09-05 LAB — BASIC METABOLIC PANEL
Anion gap: 11 (ref 5–15)
BUN: 13 mg/dL (ref 8–23)
CO2: 21 mmol/L — ABNORMAL LOW (ref 22–32)
Calcium: 8.2 mg/dL — ABNORMAL LOW (ref 8.9–10.3)
Chloride: 97 mmol/L — ABNORMAL LOW (ref 98–111)
Creatinine, Ser: 0.55 mg/dL (ref 0.44–1.00)
GFR calc Af Amer: >60 mL/min (ref >60)
GFR calc non Af Amer: >60 mL/min (ref >60)
Glucose, Bld: 100 mg/dL — ABNORMAL HIGH (ref 70–99)
Potassium: 3.8 mmol/L (ref 3.5–5.1)
Sodium: 129 mmol/L — ABNORMAL LOW (ref 135–145)

## 2019-09-05 LAB — MAGNESIUM: Magnesium: 2.2 mg/dL (ref 1.7–2.4)

## 2019-09-05 LAB — CBC
HCT: 36.3 % (ref 36.0–46.0)
Hemoglobin: 11.4 g/dL — ABNORMAL LOW (ref 12.0–15.0)
MCH: 26.6 pg (ref 26.0–34.0)
MCHC: 31.4 g/dL (ref 30.0–36.0)
MCV: 84.6 fL (ref 80.0–100.0)
Platelets: 378 10*3/uL (ref 150–400)
RBC: 4.29 MIL/uL (ref 3.87–5.11)
RDW: 14.9 % (ref 11.5–15.5)
WBC: 6 10*3/uL (ref 4.0–10.5)
nRBC: 0 % (ref 0.0–0.2)

## 2019-09-05 LAB — URIC ACID: Uric Acid, Serum: 3.9 mg/dL (ref 2.5–7.1)

## 2019-09-05 MED ORDER — SODIUM CHLORIDE 0.9 % IV SOLN
1.0000 g | Freq: Three times a day (TID) | INTRAVENOUS | Status: DC
  Administered 2019-09-05 – 2019-09-06 (×4): 1 g via INTRAVENOUS
  Filled 2019-09-05 (×4): qty 1

## 2019-09-05 MED ORDER — SODIUM CHLORIDE 0.9% FLUSH: 10.0000 mL | INTRAVENOUS | Status: DC | PRN

## 2019-09-05 MED ORDER — CHLORHEXIDINE GLUCONATE CLOTH 2 % EX PADS: 6.0000 | MEDICATED_PAD | Freq: Every day | CUTANEOUS | Status: DC

## 2019-09-05 MED ORDER — CHLORHEXIDINE GLUCONATE CLOTH 2 % EX PADS
6.0000 | MEDICATED_PAD | Freq: Every day | CUTANEOUS | Status: DC
  Administered 2019-09-05: 6 via TOPICAL

## 2019-09-05 NOTE — TOC Initial Note (Signed)
Transition of Care [facility]) - Initial/Assessment Note    Patient Details  Name: Anna Hanna MRN: [id number] Date of Birth: 1935-02-24  Transition of Care [facility]) CM/SW Contact:    Shade Flood, LCSW Phone Number: 09/05/2019, 11:41 AM  Clinical Narrative:                  Pt admitted from [facility] ALF. Per MD, pt needs 7 days IV antibiotic. Pt will need SNF for this as the ALF cannot do IV anbx. Spoke with pt's daughter who is agreeable to SNF referrals.  Will follow up with daughter once bed offers available. Anticipating dc tomorrow.  Expected Discharge Plan: Skilled Nursing Facility Barriers to Discharge: Continued Medical Work up   Patient Goals and CMS Choice   CMS Medicare.gov Compare Post Acute Care list provided to:: Patient Represenative (must comment) Choice offered to / list presented to : Adult Children  Expected Discharge Plan and Services Expected Discharge Plan: [place] In-house Referral: Clinical Social Work   Post Acute Care Choice: [place] Living arrangements for the past 2 months: [place]                                      Prior Living Arrangements/Services Living arrangements for the past 2 months: [place] Lives with:: Facility Resident Patient language and need for interpreter reviewed:: Yes Do you feel safe going back to the place where you live?: Yes      Need for Family Participation in Patient Care: No (Comment) Care giver support system in place?: Yes (comment)   Criminal Activity/Legal Involvement Pertinent to Current Situation/Hospitalization: No - Comment as needed  Activities of Daily Living Home Assistive Devices/Equipment: Wheelchair, Hospital bed ADL Screening (condition at time of admission) Patient's cognitive ability adequate to safely complete daily activities?: Yes Is the patient deaf or have difficulty hearing?: Yes Does the patient have difficulty  seeing, even when wearing glasses/contacts?: No Does the patient have difficulty concentrating, remembering, or making decisions?: No Patient able to express need for assistance with ADLs?: Yes Does the patient have difficulty dressing or bathing?: Yes Independently performs ADLs?: No Communication: Independent Dressing (OT): Dependent, Needs assistance Is this a change from baseline?: Pre-admission baseline Grooming: Needs assistance Is this a change from baseline?: Pre-admission baseline Bathing: Independent Toileting: Needs assistance Is this a change from baseline?: Pre-admission baseline In/Out Bed: Needs assistance Is this a change from baseline?: Pre-admission baseline Walks in Home: Needs assistance, Dependent Is this a change from baseline?: Pre-admission baseline Does the patient have difficulty walking or climbing stairs?: Yes Weakness of Legs: Both Weakness of Arms/Hands: Both  Permission Sought/Granted Permission sought to share information with : Chartered certified accountant granted to share information with : Yes, Verbal Permission Granted     Permission granted to share info w AGENCY: local SNFs        Emotional Assessment       Orientation: : Oriented to Self Alcohol / Substance Use: Not Applicable Psych Involvement: No (comment)  Admission diagnosis:  Pyelonephritis [N12] Sepsis ([place]) [A41.9] Sepsis, due to unspecified organism, unspecified whether acute organ dysfunction present [facility]) [A41.9] Patient Active Problem List   Diagnosis Date Noted  . ESBL (extended spectrum beta-lactamase) producing bacteria infection 09/05/2019  . Sepsis ([place]) 09/03/2019  . Acute pyelonephritis 09/03/2019  . Dementia ([place]) 09/03/2019  . Sepsis due to Enterococcus [facility])  12/14/2016  . Pressure injury of skin 12/13/2016  . Acute metabolic encephalopathy 12/13/2016  . Sepsis due to undetermined organism (HCC) 12/13/2016  . Fall 12/12/2016  . Living  accommodation issues 12/12/2016  . Altered mental status 12/12/2016  . Decubitus ulcer of right ischium, unstageable (HCC)   . UTI (urinary tract infection) 12/03/2016  . UTI (urinary tract infection) due to urinary indwelling catheter (HCC) 12/02/2016  . Benign essential HTN 12/02/2016  . Rhabdomyolysis 09/30/2016  . Immobility 09/30/2016  . Hypokalemia 09/30/2016  . Pressure ulcer, stage 2 (HCC) 09/30/2016  . Acute renal failure (HCC) 09/30/2016  . Hypothermia 09/30/2016  . 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 Hyponatremia 04/02/2016  . Hyperglycemia 04/02/2016  . Femur fracture, right (HCC) 04/02/2016  . Osteoarthritis of right knee 03/30/2016  . Arthritis of knee, right    PCP:  [name], MD Pharmacy:   [facility] - [place], [place] - [address] [place] [address] Phone: [phone] Fax: [phone]  [facility] - [place], [place] - [address] [place] [address] Phone: [phone] Fax: [phone]  [id number] - [place], [place] - [address] AT [facility] [address] [place] [address] Phone: [phone] Fax: [phone]     Social Determinants of Health (SDOH) Interventions    Readmission Risk Interventions Readmission Risk Prevention Plan 09/05/2019  Medication Screening Complete  Transportation Screening Complete  Some recent data might be hidden

## 2019-09-05 NOTE — Progress Notes (Addendum)
PHARMACY CONSULT NOTE FOR:  OUTPATIENT  PARENTERAL ANTIBIOTIC THERAPY (OPAT)  Indication: ESBL e. Coli UTI/pyelonephritis Regimen: Ertapenem 1000 mg IV every 24 hours. End date: Last day of therapy 09/11/19  IV antibiotic discharge orders are pended. To discharging provider:  please sign these orders via discharge navigator,  Select New Orders & click on the button choice - Manage This Unsigned Work.     Thank you for allowing pharmacy to be a part of this patient's care.  Tad Moore 09/05/2019, 12:10 PM

## 2019-09-05 NOTE — NC FL2 (Signed)
[place] LEVEL OF CARE SCREENING TOOL     IDENTIFICATION  Patient Name: Anna Hanna Birthdate: 1935/05/30 Sex: female Admission Date (Current Location): 09/03/2019  [facility] and [place] Number:  [employer] and Address:  [facility] [address] [place]      Provider Number: [id number]  Attending Physician Name and Address:  Damita Lack, MD  Relative Name and Phone Number:       Current Level of Care: Hospital Recommended Level of Care: [place] Prior Approval Number:    Date Approved/Denied:   PASRR Number: [id number]  Discharge Plan: SNF    Current Diagnoses: Patient Active Problem List   Diagnosis Date Noted  . ESBL (extended spectrum beta-lactamase) producing bacteria infection 09/05/2019  . Sepsis ([place]) 09/03/2019  . Acute pyelonephritis 09/03/2019  . Dementia ([place]) 09/03/2019  . Sepsis due to Enterococcus ([place]) 12/14/2016  . Pressure injury of skin 12/13/2016  . Acute metabolic encephalopathy 47/82/9562  . Sepsis due to undetermined organism ([place]) 12/13/2016  . Fall 12/12/2016  . Living accommodation issues 12/12/2016  . Altered mental status 12/12/2016  . Decubitus ulcer of right ischium, unstageable ([place])   . UTI (urinary tract infection) 12/03/2016  . UTI (urinary tract infection) due to urinary indwelling catheter ([place]) 12/02/2016  . Benign essential HTN 12/02/2016  . Rhabdomyolysis 09/30/2016  . Immobility 09/30/2016  . Hypokalemia 09/30/2016  . Pressure ulcer, stage 2 ([place]) 09/30/2016  . Acute renal failure ([place]) 09/30/2016  . Hypothermia 09/30/2016  . 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 Hyponatremia 04/02/2016  . Hyperglycemia 04/02/2016  . Femur fracture, right ([place]) 04/02/2016  . Osteoarthritis of right knee 03/30/2016  . Arthritis of knee, right     Orientation RESPIRATION BLADDER  Height & Weight     Self  Normal Incontinent Weight: 179 lb 7.3 oz (81.4 kg) Height:  5\' 5"  (165.1 cm)  BEHAVIORAL SYMPTOMS/MOOD NEUROLOGICAL BOWEL NUTRITION STATUS      Incontinent Diet(see dc summary)  AMBULATORY STATUS COMMUNICATION OF NEEDS Skin   Extensive Assist Verbally PU Stage and Appropriate Care   PU Stage 2 Dressing: (R upper thigh change dressing as needed)                   Personal Care Assistance Level of Assistance  Bathing, Feeding, Dressing Bathing Assistance: Limited assistance Feeding assistance: Limited assistance Dressing Assistance: Limited assistance     Functional Limitations Info  Sight, Hearing, Speech Sight Info: Adequate Hearing Info: Adequate Speech Info: Impaired    SPECIAL CARE FACTORS FREQUENCY  PT (By licensed PT), OT (By licensed OT)     PT Frequency: 5 times weekly OT Frequency: 3 times weekly            Contractures Contractures Info: Present    Additional Factors Info  Code Status, Allergies Code Status Info: full Allergies Info: NKA           Current Medications (09/05/2019):  This is the current hospital active medication list Current Facility-Administered Medications  Medication Dose Route Frequency Provider Last Rate Last Admin  . 0.9 %  sodium chloride infusion   Intravenous Continuous Damita Lack, MD   Stopped at 09/04/19 1436  . acetaminophen (TYLENOL) tablet 650 mg  650 mg Oral Q6H PRN Emokpae, Ejiroghene E, MD       Or  . acetaminophen (TYLENOL) suppository 650 mg  650 mg Rectal  Q6H PRN Emokpae, Ejiroghene E, MD      . enoxaparin (LOVENOX) injection 40 mg  40 mg Subcutaneous Q24H Emokpae, Ejiroghene E, MD   40 mg at 09/04/19 2120  . ipratropium-albuterol (DUONEB) 0.5-2.5 (3) MG/3ML nebulizer solution 3 mL  3 mL Nebulization Q4H PRN Amin, Ankit Chirag, MD      . meropenem (MERREM) 1 g in sodium chloride 0.9 % 100 mL IVPB  1 g Intravenous Q8H Amin, Ankit Chirag, MD 200 mL/hr at 09/05/19 1044 1 g at  09/05/19 1044  . ondansetron (ZOFRAN) tablet 4 mg  4 mg Oral Q6H PRN Emokpae, Ejiroghene E, MD       Or  . ondansetron (ZOFRAN) injection 4 mg  4 mg Intravenous Q6H PRN Emokpae, Ejiroghene E, MD      . oxybutynin (DITROPAN-XL) 24 hr tablet 10 mg  10 mg Oral Daily Emokpae, Ejiroghene E, MD   10 mg at 09/05/19 1032  . polyethylene glycol (MIRALAX / GLYCOLAX) packet 17 g  17 g Oral Daily PRN Emokpae, Ejiroghene E, MD      . senna-docusate (Senokot-S) tablet 2 tablet  2 tablet Oral QHS PRN Amin, Loura Halt, MD         Discharge Medications: Please see discharge summary for a list of discharge medications.  Relevant Imaging Results:  Relevant Lab Results:   Additional Information SSN: [id number], LCSW

## 2019-09-05 NOTE — Progress Notes (Signed)
Peripherally Inserted Central Catheter/Midline Placement  The IV Nurse has discussed with the patient and/or persons authorized to consent for the patient, the purpose of this procedure and the potential benefits and risks involved with this procedure.  The benefits include less needle sticks, lab draws from the catheter, and the patient may be discharged home with the catheter. Risks include, but not limited to, infection, bleeding, blood clot (thrombus formation), and puncture of an artery; nerve damage and irregular heartbeat and possibility to perform a PICC exchange if needed/ordered by physician.  Alternatives to this procedure were also discussed.  Bard Power PICC patient education guide, fact sheet on infection prevention and patient information card has been provided to patient /or left at bedside.    PICC/Midline Placement Documentation  PICC Single Lumen 09/05/19 PICC Right Brachial 39 cm 0 cm (Active)  Indication for Insertion or Continuance of Line Home intravenous therapies (PICC only) 09/05/19 1642  Exposed Catheter (cm) 0 cm 09/05/19 1642  Site Assessment Clean;Dry;Intact 09/05/19 1642  Line Status Flushed;Blood return noted;Saline locked 09/05/19 1642  Dressing Type Transparent 09/05/19 1642  Dressing Status Clean;Dry;Intact;Antimicrobial disc in place 09/05/19 1642  Dressing Change Due 09/12/19 09/05/19 1642       Audrie Gallus 09/05/2019, 4:44 PM

## 2019-09-05 NOTE — Care Management Important Message (Signed)
Important Message  Patient Details  Name: Anna Hanna MRN: [id number] Date of Birth: 1934/12/21   Medicare Important Message Given:  Yes  RN Elease Hashimoto will deliver to patient due to precautions   Corey Harold 09/05/2019, 4:17 PM

## 2019-09-05 NOTE — Progress Notes (Addendum)
PROGRESS NOTE    Anna Hanna  TKZ:[id number] DOB: 05/23/1935 DOA: 09/03/2019 PCP: [name], MD   Brief Narrative:  85 year old with history of HTN, paraplegia, dementia was diagnosed with UTI outpatient and started on Bactrim 3 days ago.  History was difficult to obtain but patient was noted to be slightly confused with episode of vomiting at the nursing home therefore sent to the hospital.  UA was suggestive of urinary tract infection.  CT of the abdomen pelvis with contrast concerns for pyelonephritis.  Urine cultures growing ESBL therefore meropenem.   Assessment & Plan:   Principal Problem:   Acute pyelonephritis Active Problems:   Immobility   Pressure ulcer, stage 2 (HCC)   Benign essential HTN   Sepsis (HCC)   Dementia (HCC)   ESBL (extended spectrum beta-lactamase) producing bacteria infection  Sepsis secondary to acute pyelonephritis ESBL UTI -Urine cultures reviewed.  Transition to meropenem. -CT abdomen pelvis with contrast shows pyelonephritis -Supportive care.  IV fluids -We will order PICC line placement, she may be able to go to short-term SNF for IV treatment as she resides at ALF.  TOC team will help with the placement.  Mild renal insufficiency, prerenal Hypovolemic hyponatremia -Slightly improved renal function.  Sodium level still remain 129.  Maintain gentle hydration today  Right ankle pain, resolved -Ankle X-rays negative. -No need for PT OT, as she is wheelchair-bound  Essential hypertension -Diet controlled  History of dementia/paraplegia -Usually wheelchair-bound.  Resides at nursing home.  Mentation at baseline  Stage II right upper thigh ulcer -No evidence of active infection.  Routine wound care    DVT prophylaxis: Lovenox Code Status: Full code Family Communication:   Disposition Plan:   Patient From= [facility] nursing home  Patient Anticipated D/C place= [facility] nursing home  Barriers= maintain hospital stay for IV  meropenem treatment for ESBL.  We are working on getting her PICC line insertion and then short-term SNF placement for IV antibiotics and nursing care.  She resides at ALF without any assistance therefore will be unsafe to discharge her there   Subjective: Feels little better, denying any complaints.  Remains afebrile.  Review of Systems Otherwise negative except as per HPI, including: General = no fevers, chills, dizziness,  fatigue HEENT/EYES = negative for loss of vision, double vision, blurred vision,  sore throa Cardiovascular= negative for chest pain, palpitation Respiratory/lungs= negative for shortness of breath, cough, wheezing; hemoptysis,  Gastrointestinal= negative for nausea, vomiting, abdominal pain Genitourinary= negative for Dysuria MSK = Negative for arthralgia, myalgias Neurology= Negative for headache, numbness, tingling  Psychiatry= Negative for suicidal and homocidal ideation Skin= Negative for Rash   Examination:  Constitutional: Not in acute distress, elderly frail Respiratory: Clear to auscultation bilaterally Cardiovascular: Normal sinus rhythm, no rubs Abdomen: Nontender nondistended good bowel sounds Musculoskeletal: No edema noted Skin: No rashes seen Neurologic: CN 2-12 grossly intact.  And nonfocal Psychiatric: Normal judgment and insight. Alert and oriented x 3. Normal mood.     Objective: Vitals:   09/04/19 1423 09/04/19 2048 09/04/19 2117 09/05/19 0421  BP: (!) 107/59  (!) 146/79 128/74  Pulse: (!) 110  (!) 108 98  Resp: 19  18   Temp: 98.8 F (37.1 C)  98.7 F (37.1 C) 97.6 F (36.4 C)  TempSrc: Oral  Oral Oral  SpO2: 100% 96% 96% 100%  Weight:      Height:        Intake/Output Summary (Last 24 hours) at 09/05/2019 1019 Last data filed  at 09/05/2019 0700 Gross per 24 hour  Intake 756.41 ml  Output 700 ml  Net 56.41 ml   Filed Weights   09/03/19 1058 09/03/19 2005  Weight: 90.7 kg 81.4 kg     Data Reviewed:   CBC: Recent  Labs  Lab 09/03/19 1136 09/04/19 0539 09/05/19 0517  WBC 11.9* 9.8 6.0  NEUTROABS 10.3*  --   --   HGB 13.6 12.3 11.4*  HCT 43.6 39.1 36.3  MCV 83.8 84.6 84.6  PLT 430* 380 284   Basic Metabolic Panel: Recent Labs  Lab 09/03/19 1136 09/04/19 0539 09/05/19 0517  NA 127* 130* 129*  K 4.1 3.9 3.8  CL 90* 96* 97*  CO2 23 23 21*  GLUCOSE 161* 98 100*  BUN 28* 21 13  CREATININE 0.95 0.79 0.55  CALCIUM 9.1 8.5* 8.2*  MG  --   --  2.2   GFR: Estimated Creatinine Clearance: 57.2 mL/min (by C-G formula based on SCr of 0.55 mg/dL). Liver Function Tests: Recent Labs  Lab 09/03/19 1136  AST 20  ALT 16  ALKPHOS 66  BILITOT 0.9  PROT 7.5  ALBUMIN 3.9   Recent Labs  Lab 09/03/19 1136  LIPASE 20   No results for input(s): AMMONIA in the last 168 hours. Coagulation Profile: Recent Labs  Lab 09/03/19 1130  INR 1.1   Cardiac Enzymes: No results for input(s): CKTOTAL, CKMB, CKMBINDEX, TROPONINI in the last 168 hours. BNP (last 3 results) No results for input(s): PROBNP in the last 8760 hours. HbA1C: No results for input(s): HGBA1C in the last 72 hours. CBG: No results for input(s): GLUCAP in the last 168 hours. Lipid Profile: No results for input(s): CHOL, HDL, LDLCALC, TRIG, CHOLHDL, LDLDIRECT in the last 72 hours. Thyroid Function Tests: No results for input(s): TSH, T4TOTAL, FREET4, T3FREE, THYROIDAB in the last 72 hours. Anemia Panel: No results for input(s): VITAMINB12, FOLATE, FERRITIN, TIBC, IRON, RETICCTPCT in the last 72 hours. Sepsis Labs: Recent Labs  Lab 09/03/19 1130 09/03/19 1330  LATICACIDVEN 1.6 1.7    Recent Results (from the past 240 hour(s))  Blood Culture (routine x 2)     Status: None (Preliminary result)   Collection Time: 09/03/19 11:30 AM   Specimen: BLOOD RIGHT ARM  Result Value Ref Range Status   Specimen Description BLOOD RIGHT ARM DRAWN BY RN  Final   Special Requests   Final    BOTTLES DRAWN AEROBIC AND ANAEROBIC Blood Culture  adequate volume   Culture   Final    NO GROWTH 2 DAYS Performed at [facility], [address] [place], [place] [address]    Report Status PENDING  Incomplete  Respiratory Panel by RT PCR (Flu A&B, Covid) - Urine, Clean Catch     Status: None   Collection Time: 09/03/19 11:32 AM   Specimen: Urine, Clean Catch  Result Value Ref Range Status   SARS Coronavirus 2 by RT PCR NEGATIVE NEGATIVE Final    Comment: (NOTE) SARS-CoV-2 target nucleic acids are NOT DETECTED. The SARS-CoV-2 RNA is generally detectable in upper respiratoy specimens during the acute phase of infection. The lowest concentration of SARS-CoV-2 viral copies this assay can detect is 131 copies/mL. A negative result does not preclude SARS-Cov-2 infection and should not be used as the sole basis for treatment or other patient management decisions. A negative result may occur with  improper specimen collection/handling, submission of specimen other than nasopharyngeal swab, presence of viral mutation(s) within the areas targeted by this assay, and  inadequate number of viral copies (<131 copies/mL). A negative result must be combined with clinical observations, patient history, and epidemiological information. The expected result is Negative. Fact Sheet for Patients:  https://www.moore.com/ Fact Sheet for Healthcare Providers:  https://www.young.biz/ This test is not yet ap proved or cleared by the [place] FDA and  has been authorized for detection and/or diagnosis of SARS-CoV-2 by FDA under an Emergency Use Authorization (EUA). This EUA will remain  in effect (meaning this test can be used) for the duration of the COVID-19 declaration under Section 564(b)(1) of the Act, 21 U.S.C. section 360bbb-3(b)(1), unless the authorization is terminated or revoked sooner.    Influenza A by PCR NEGATIVE NEGATIVE Final   Influenza B by PCR NEGATIVE NEGATIVE Final    Comment: (NOTE) The  Xpert Xpress SARS-CoV-2/FLU/RSV assay is intended as an aid in  the diagnosis of influenza from Nasopharyngeal swab specimens and  should not be used as a sole basis for treatment. Nasal washings and  aspirates are unacceptable for Xpert Xpress SARS-CoV-2/FLU/RSV  testing. Fact Sheet for Patients: https://www.moore.com/ Fact Sheet for Healthcare Providers: https://www.young.biz/ This test is not yet approved or cleared by the [place] FDA and  has been authorized for detection and/or diagnosis of SARS-CoV-2 by  FDA under an Emergency Use Authorization (EUA). This EUA will remain  in effect (meaning this test can be used) for the duration of the  Covid-19 declaration under Section 564(b)(1) of the Act, 21  U.S.C. section 360bbb-3(b)(1), unless the authorization is  terminated or revoked. Performed at [facility], [address] [place], [place] [address]   Blood Culture (routine x 2)     Status: None (Preliminary result)   Collection Time: 09/03/19 11:35 AM   Specimen: BLOOD RIGHT FOREARM  Result Value Ref Range Status   Specimen Description BLOOD RIGHT FOREARM  Final   Special Requests   Final    BOTTLES DRAWN AEROBIC AND ANAEROBIC Blood Culture results may not be optimal due to an inadequate volume of blood received in culture bottles   Culture   Final    NO GROWTH 2 DAYS Performed at [facility], [address], [place], [place] [address]    Report Status PENDING  Incomplete  Urine culture     Status: Abnormal   Collection Time: 09/03/19 11:40 AM   Specimen: In/Out Cath Urine  Result Value Ref Range Status   Specimen Description   Final    IN/OUT CATH URINE Performed at [facility], [address], [place], [place] [address]    Special Requests   Final    NONE Performed at [facility], [address] [place], [place] [address]    Culture (A)  Final    >=100,000 COLONIES/mL ESCHERICHIA COLI 50,000 COLONIES/mL PROTEUS  MIRABILIS Confirmed Extended Spectrum Beta-Lactamase Producer (ESBL).  In bloodstream infections from ESBL organisms, carbapenems are preferred over piperacillin/tazobactam. They are shown to have a lower risk of mortality. FOR ECOL Performed at [facility], [address], [place], [place] [address]    Report Status 09/05/2019 FINAL  Final   Organism ID, Bacteria ESCHERICHIA COLI (A)  Final   Organism ID, Bacteria PROTEUS MIRABILIS (A)  Final      Susceptibility   Escherichia coli - MIC*    AMPICILLIN >=32 RESISTANT Resistant     CEFAZOLIN >=64 RESISTANT Resistant     CEFTRIAXONE >=64 RESISTANT Resistant     CIPROFLOXACIN >=4 RESISTANT Resistant     GENTAMICIN 4 SENSITIVE Sensitive  IMIPENEM <=0.25 SENSITIVE Sensitive     NITROFURANTOIN <=16 SENSITIVE Sensitive     TRIMETH/SULFA >=320 RESISTANT Resistant     AMPICILLIN/SULBACTAM >=32 RESISTANT Resistant     PIP/TAZO 8 SENSITIVE Sensitive     * >=100,000 COLONIES/mL ESCHERICHIA COLI   Proteus mirabilis - MIC*    AMPICILLIN <=2 SENSITIVE Sensitive     CEFAZOLIN <=4 SENSITIVE Sensitive     CEFTRIAXONE <=0.25 SENSITIVE Sensitive     CIPROFLOXACIN >=4 RESISTANT Resistant     GENTAMICIN <=1 SENSITIVE Sensitive     IMIPENEM 4 SENSITIVE Sensitive     NITROFURANTOIN 256 RESISTANT Resistant     TRIMETH/SULFA <=20 SENSITIVE Sensitive     AMPICILLIN/SULBACTAM <=2 SENSITIVE Sensitive     PIP/TAZO <=4 SENSITIVE Sensitive     * 50,000 COLONIES/mL PROTEUS MIRABILIS         Radiology Studies: DG Ankle 2 Views Right  Result Date: 09/04/2019 CLINICAL DATA:  Pt c/o pain in Rt ankle. Pt seems confused when asked about Rt ankle - Pt has wound dressing to posterior aspect of Rt heel. Unsure of any injury. HX diabetes EXAM: RIGHT ANKLE - 2 VIEW COMPARISON:  None. FINDINGS: No fracture or bone lesion. No bone resorption is seen to suggest osteomyelitis. Ankle joint is normally spaced and aligned. No arthropathic changes. Minimal plantar  and small dorsal calcaneal spurs. Skeletal structures are demineralized. No soft tissue air. IMPRESSION: 1. No fracture, bone lesion or ankle joint abnormality. 2. No evidence of osteomyelitis. Electronically Signed   By: Amie [place] M.D.   On: 09/04/2019 13:50   CT ABDOMEN PELVIS W CONTRAST  Result Date: 09/03/2019 CLINICAL DATA:  Nausea and vomiting for 2 days. Question sepsis from cholecystitis. Technologist notes state diagnosed with urinary tract infection and started on antibiotics 3 days ago. EXAM: CT ABDOMEN AND PELVIS WITH CONTRAST TECHNIQUE: Multidetector CT imaging of the abdomen and pelvis was performed using the standard protocol following bolus administration of intravenous contrast. CONTRAST:  OMNIPAQUE IOHEXOL 300 MG/ML  SOLN COMPARISON:  None. FINDINGS: Lower chest: Mild subpleural reticulation and interstitial coarsening. Mild pleural thickening without significant effusion. No confluent airspace disease. There is wall thickening of the distal esophagus. Peripherally calcified 2.2 cm lesion in the left breast/low left axilla is partially included. Hepatobiliary: Possible tiny hypodensity adjacent to the gallbladder fossa, series 2, image 23. Gallbladder is elongated. No evidence of gallbladder wall thickening, calcified gallstone, or pericholecystic inflammation. Upper normal common bile duct at 7 mm. Pancreas: Parenchymal atrophy. No ductal dilatation or inflammation. Spleen: Normal in size without focal abnormality. Adrenals/Urinary Tract: No adrenal nodule. No hydronephrosis. Nephrograms in the both kidneys are slightly heterogeneous. No focal renal lesion or fluid collection.1 urinary bladder is physiologically distended. There is no bladder wall thickening. No perivesicular stranding. Stomach/Bowel: Wall thickening of the distal esophagus. Fluid-filled stomach without gastric wall thickening. Normal positioning of the ligament of Treitz. Detailed bowel assessment is limited in the  absence of enteric contrast. Diminutive normal appendix, series 7, image 49. No appendicitis. Moderate stool burden throughout the colon. Mild gaseous distension of transverse colon. There is no colonic wall thickening or inflammation. Vascular/Lymphatic: Aorto bi-iliac atherosclerosis. No aortic aneurysm. Portal vein is patent. No enlarged abdominopelvic lymph nodes. Reproductive: Uterus surgically absent. No adnexal mass. Other: No free air or ascites. No evidence of intra-abdominal fluid collection. Musculoskeletal: Degenerative change in the lower lumbar spine. Degenerative change of both hips. Trabecular coarsening of the left inferior pubic ramus and ischium. IMPRESSION: 1. Wall thickening of  the distal esophagus, suspicious for esophagitis or reflux. 2. Slight heterogeneous nephrograms of both kidneys, may represent pyelonephritis in the setting of recent urinary tract infection. No focal fluid collection. 3. No CT findings of acute cholecystitis. 4. Trabecular coarsening of the left inferior pubic ramus and ischium, may be sequela of prior trauma or Paget's disease. Aortic Atherosclerosis (ICD10-I70.0). Electronically Signed   By: Narda Rutherford M.D.   On: 09/03/2019 15:14   DG Chest Port 1 View  Result Date: 09/03/2019 CLINICAL DATA:  Sepsis. EXAM: PORTABLE CHEST 1 VIEW COMPARISON:  03/26/2018 FINDINGS: The heart size and pulmonary vascularity are normal. No infiltrates or effusions. Chronic peribronchial thickening. No acute bone abnormality. IMPRESSION: No acute abnormality. Electronically Signed   By: Francene Boyers M.D.   On: 09/03/2019 12:02        Scheduled Meds: . enoxaparin (LOVENOX) injection  40 mg Subcutaneous Q24H  . oxybutynin  10 mg Oral Daily   Continuous Infusions: . sodium chloride Stopped (09/04/19 1436)  . cefTRIAXone (ROCEPHIN)  IV Stopped (09/04/19 1151)     LOS: 2 days   Time spent= 35 mins    Jamarie Mussa Joline Maxcy, MD [facility]  If 7PM-7AM, please  contact night-coverage  09/05/2019, 10:19 AM

## 2019-09-05 NOTE — TOC Progression Note (Signed)
Transition of Care [facility]) - Progression Note    Patient Details  Name: Anna Hanna MRN: [id number] Date of Birth: Nov 10, 1934  Transition of Care [facility]) CM/SW Contact  Elliot Gault, LCSW Phone Number: 09/05/2019, 4:11 PM  Clinical Narrative:     Received SNF auth from [employer]. [employer]esta Mixer number is [id number]. [employer] number is [phone]. Start date 2/13. 4days approved. Update due 2/16 to [employer]. Updated Kerri at [facility].   Expected Discharge Plan: Skilled Nursing Facility Barriers to Discharge: Continued Medical Work up  Expected Discharge Plan and Services Expected Discharge Plan: Skilled Nursing Facility In-house Referral: Clinical Social Work   Post Acute Care Choice: Skilled Nursing Facility Living arrangements for the past 2 months: Assisted Living Facility                                       Social Determinants of Health (SDOH) Interventions    Readmission Risk Interventions Readmission Risk Prevention Plan 09/05/2019  Medication Screening Complete  Transportation Screening Complete  Some recent data might be hidden

## 2019-09-06 ENCOUNTER — Inpatient Hospital Stay
Admission: RE | Admit: 2019-09-06 | Discharge: 2021-05-20 | Disposition: A | Discharge status: Still In Hospital | Payer: Medicare HMO | Source: Ambulatory Visit | Attending: Internal Medicine | Admitting: Internal Medicine

## 2019-09-06 DIAGNOSIS — U099 Post covid-19 condition, unspecified: Secondary | ICD-10-CM

## 2019-09-06 DIAGNOSIS — K219 Gastro-esophageal reflux disease without esophagitis: Principal | ICD-10-CM

## 2019-09-06 DIAGNOSIS — R1110 Vomiting, unspecified: Secondary | ICD-10-CM

## 2019-09-06 DIAGNOSIS — I82403 Acute embolism and thrombosis of unspecified deep veins of lower extremity, bilateral: Secondary | ICD-10-CM

## 2019-09-06 DIAGNOSIS — R7989 Other specified abnormal findings of blood chemistry: Secondary | ICD-10-CM

## 2019-09-06 DIAGNOSIS — A4150 Gram-negative sepsis, unspecified: Secondary | ICD-10-CM

## 2019-09-06 DIAGNOSIS — A499 Bacterial infection, unspecified: Secondary | ICD-10-CM

## 2019-09-06 LAB — BASIC METABOLIC PANEL
Anion gap: 10 (ref 5–15)
BUN: 11 mg/dL (ref 8–23)
CO2: 26 mmol/L (ref 22–32)
Calcium: 8.4 mg/dL — ABNORMAL LOW (ref 8.9–10.3)
Chloride: 99 mmol/L (ref 98–111)
Creatinine, Ser: 0.46 mg/dL (ref 0.44–1.00)
GFR calc Af Amer: >60 mL/min (ref >60)
GFR calc non Af Amer: >60 mL/min (ref >60)
Glucose, Bld: 113 mg/dL — ABNORMAL HIGH (ref 70–99)
Potassium: 3.7 mmol/L (ref 3.5–5.1)
Sodium: 135 mmol/L (ref 135–145)

## 2019-09-06 LAB — MAGNESIUM: Magnesium: 2 mg/dL (ref 1.7–2.4)

## 2019-09-06 LAB — CBC
HCT: 37.4 % (ref 36.0–46.0)
Hemoglobin: 11.5 g/dL — ABNORMAL LOW (ref 12.0–15.0)
MCH: 26.6 pg (ref 26.0–34.0)
MCHC: 30.7 g/dL (ref 30.0–36.0)
MCV: 86.6 fL (ref 80.0–100.0)
Platelets: 408 10*3/uL — ABNORMAL HIGH (ref 150–400)
RBC: 4.32 MIL/uL (ref 3.87–5.11)
RDW: 15.1 % (ref 11.5–15.5)
WBC: 6.5 10*3/uL (ref 4.0–10.5)
nRBC: 0 % (ref 0.0–0.2)

## 2019-09-06 MED ORDER — ERTAPENEM IV (FOR PTA / DISCHARGE USE ONLY): 1.0000 g | INTRAVENOUS | 0 refills | Status: AC

## 2019-09-06 NOTE — TOC Transition Note (Signed)
Transition of Care [facility]) - CM/SW Discharge Note   Patient Details  Name: Anna Hanna MRN: [id number] Date of Birth: 1934/09/29  Transition of [facility]) CM/SW Contact:  Malessa Zartman, Chrystine Oiler, RN Phone Number: 09/06/2019, 10:08 AM   Clinical Narrative:  Patient discharging to [facility] today. PNC ready to receive patient this afternoon. Stanton Kidney, daughter, updated, she plans to drop off clothes tomorrow at [facility]. D/C clinicals uploaded to HUB and DC summary/OPAT notes faxed. Will ask nurse to also send DC summary with patient. Bedside RN to call report to [phone].  [facility] staff to transport patient.      Final next level of care: Skilled Nursing Facility Barriers to Discharge: Barriers Resolved   Patient Goals and CMS Choice   CMS Medicare.gov Compare Post Acute Care list provided to:: Patient Represenative (must comment) Choice offered to / list presented to : Adult Children  Discharge Placement              Patient chooses bed at: [facility] Patient to be transferred to facility by: [facility] staff Name of family member notified: Debra Patient and family notified of of transfer: 09/06/19  Discharge Plan and Services In-house Referral: Clinical Social Work   Post Acute Care Choice: Skilled Nursing Facility                   Readmission Risk Interventions Readmission Risk Prevention Plan 09/05/2019  Medication Screening Complete  Transportation Screening Complete  Some recent data might be hidden

## 2019-09-06 NOTE — Discharge Summary (Signed)
Physician Discharge Summary  Anna Hanna WFU:[id number] DOB: 12/11/1934 DOA: 09/03/2019  PCP: Lemmie Evens, MD  Admit date: 09/03/2019  Discharge date: 09/06/2019  Admitted From:[facility]  Disposition:  [facility]  Recommendations for Outpatient Follow-up:  1. Follow up with PCP in 1-2 weeks 2. Complete course of treatment with ertapenem as prescribed through 2/18 for ESBL pyelonephritis/UTI 3. Continue other home medications as prior  Home Health: None  Equipment/Devices: None, has PICC line  Discharge Condition: Stable  CODE STATUS: Full  Diet recommendation: Heart Healthy  Brief/Interim Summary: 85 year old with history of HTN, paraplegia, dementia was diagnosed with UTI outpatient and started on Bactrim 3 days ago.  History was difficult to obtain but patient was noted to be slightly confused with episode of vomiting at the nursing home therefore sent to the hospital.  UA was suggestive of urinary tract infection.  CT of the abdomen pelvis with contrast concerns for pyelonephritis.  Urine cultures growing ESBL therefore meropenem.  2/13: No acute overnight events noted.  She is stable for discharge to [facility] to continue IV antibiotics for her pyelonephritis.  Sepsis secondary to acute pyelonephritis ESBL UTI -Continue on ertapenem as prescribed through 2/18 to complete course of treatment -Sepsis physiology has resolved  Mild renal insufficiency, prerenal Hypovolemic hyponatremia -Resolved.  Creatinine 0.46 on day of discharge and sodium levels have corrected.  Right ankle pain, resolved -Ankle X-rays negative. -No need for PT OT, as she is wheelchair-bound  Essential hypertension -Diet controlled  History of dementia/paraplegia -Usually wheelchair-bound.  Resides at assisted living facility [facility] at baseline  Stage II right upper thigh ulcer -No evidence of active infection.  Routine wound care  Discharge Diagnoses:  Principal Problem:    Acute pyelonephritis Active Problems:   Immobility   Pressure ulcer, stage 2 (HCC)   Benign essential HTN   Sepsis (HCC)   Dementia (HCC)   ESBL (extended spectrum beta-lactamase) producing bacteria infection  Principal discharge diagnosis: Sepsis secondary to acute pyelonephritis with ESBL UTI.  Discharge Instructions  Discharge Instructions    Diet - low sodium heart healthy   Complete by: As directed    Home infusion instructions   Complete by: As directed    Instructions: Flushing of vascular access device: 0.9% NaCl pre/post medication administration and prn patency; Heparin 100 u/ml, 59m for implanted ports and Heparin 10u/ml, 536mfor all other central venous catheters.   Increase activity slowly   Complete by: As directed      Allergies as of 09/06/2019   No Known Allergies     Medication List    STOP taking these medications   nitrofurantoin 100 MG capsule Commonly known as: MACRODANTIN   sulfamethoxazole-trimethoprim 800-160 MG tablet Commonly known as: BACTRIM DS     TAKE these medications   acetaminophen 325 MG tablet Commonly known as: TYLENOL Take 650 mg by mouth 3 (three) times daily.   calcium citrate 950 (200 Ca) MG tablet Commonly known as: CALCITRATE - dosed in mg elemental calcium Take 200 mg of elemental calcium by mouth daily.   diclofenac Sodium 1 % Gel Commonly known as: VOLTAREN Apply 2-4 g topically 3 (three) times daily.   ertapenem  IVPB Commonly known as: INVANZ Inject 1 g into the vein daily for 6 days. Indication:  ESBL e. Coli UTI Last Day of Therapy:  09/11/19 Labs - Once weekly:  CBC/D and BMP, Labs - Every other week:  ESR and CRP   feeding supplement (PRO-STAT SUGAR FREE 64) Liqd  Take 30 mLs by mouth 2 (two) times daily.   Ferrex 150 150 MG capsule Generic drug: iron polysaccharides Take 1 capsule by mouth daily.   latanoprost 0.005 % ophthalmic solution Commonly known as: XALATAN Place 1 drop into both eyes at  bedtime.   mupirocin ointment 2 % Commonly known as: BACTROBAN Place 1 application into the nose 2 (two) times daily. X 5 days   naproxen 500 MG tablet Commonly known as: NAPROSYN Take 1 tablet by mouth every 12 (twelve) hours. 1 tab every 12 hours as needed for arthritis at the knee   nystatin-triamcinolone cream Commonly known as: MYCOLOG II Apply to affected area daily   oxybutynin 10 MG 24 hr tablet Commonly known as: DITROPAN-XL Take 10 mg by mouth daily.   Potassium Chloride ER 20 MEQ Tbcr Take 20 mEq by mouth daily at 6 (six) AM.   Travatan Z 0.004 % Soln ophthalmic solution Generic drug: Travoprost (BAK Free) Place 1 drop into both eyes at bedtime.   vitamin C 250 MG tablet Commonly known as: ASCORBIC ACID Take 500 mg by mouth daily.   zinc oxide 20 % ointment Apply topically 2 (two) times daily. To buttocks and prn soilage            Home Infusion Instuctions  (From admission, onward)         Start     Ordered   09/06/19 0000  Home infusion instructions    Question:  Instructions  Answer:  Flushing of vascular access device: 0.9% NaCl pre/post medication administration and prn patency; Heparin 100 u/ml, 61m for implanted ports and Heparin 10u/ml, 530mfor all other central venous catheters.   09/06/19 0949          Contact information for follow-up providers    KnLemmie EvensMD Follow up in 1 week(s).   Specialty: Family Medicine Contact information: [phone]          Contact information for after-discharge care    [facility]   Service: Skilled Nursing Contact information: [phone]               No Known Allergies  Consultations:  None   Procedures/Studies: DG Knee 1-2 Views Right  Result Date: 08/10/2019 CLINICAL DATA:  Right knee is fixated in flexed position. EXAM:  RIGHT KNEE - 1-2 VIEW COMPARISON:  July 28, 2019 FINDINGS: The right knee is fixated in flexed position. 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 There is no evidence of acute fracture or dislocation. Extensive bony remodeling and heterotopic ossifications are seen along the distal femur and the knee joint, similar to the prior studies. The patella is flattened and misshapen. There is no evidence of joint effusion. IMPRESSION: 1. Post right knee arthroplasty with the knee fixated in flexed position. 2. Extensive bony remodeling and heterotopic ossifications along the distal femur and the knee joint, stable from prior studies. Electronically Signed   By: DoFidela Salisbury.D.   On: 08/10/2019 11:16   DG Ankle 2 Views Right  Result Date: 09/04/2019 CLINICAL DATA:  Pt c/o pain in Rt ankle. Pt seems confused when asked about Rt ankle - Pt has wound dressing to posterior aspect of Rt heel. Unsure of any injury. HX diabetes EXAM: RIGHT ANKLE - 2 VIEW COMPARISON:  None. FINDINGS: No fracture or bone lesion. No bone resorption is seen to suggest osteomyelitis. Ankle joint is normally spaced and aligned. No arthropathic changes. Minimal plantar and small dorsal calcaneal spurs. Skeletal structures are demineralized. No soft tissue air. IMPRESSION: 1. No fracture, bone lesion or ankle joint abnormality. 2. No evidence of osteomyelitis. Electronically Signed   By: Lajean Manes M.D.   On: 09/04/2019 13:50   CT ABDOMEN PELVIS W CONTRAST  Result Date: 09/03/2019 CLINICAL DATA:  Nausea and vomiting for 2 days. Question sepsis from cholecystitis. Technologist notes state diagnosed with urinary tract infection and started on antibiotics 3 days ago. EXAM: CT ABDOMEN AND PELVIS WITH CONTRAST TECHNIQUE: Multidetector CT imaging of the abdomen and pelvis was performed using the standard protocol following bolus administration of intravenous contrast.  CONTRAST:  163m OMNIPAQUE IOHEXOL 300 MG/ML  SOLN COMPARISON:  None. FINDINGS: Lower chest: Mild subpleural reticulation and interstitial coarsening. Mild pleural thickening without significant effusion. No confluent airspace disease. There is wall thickening of the distal esophagus. Peripherally calcified 2.2 cm lesion in the left breast/low left axilla is partially included. Hepatobiliary: Possible tiny hypodensity adjacent to the gallbladder fossa, series 2, image 23. Gallbladder is elongated. No evidence of gallbladder wall thickening, calcified gallstone, or pericholecystic inflammation. Upper normal common bile duct at 7 mm. Pancreas: Parenchymal atrophy. No ductal dilatation or inflammation. Spleen: Normal in size without focal abnormality. Adrenals/Urinary Tract: No adrenal nodule. No hydronephrosis. Nephrograms in the both kidneys are slightly heterogeneous. No focal renal lesion or fluid collection.1 urinary bladder is physiologically distended. There is no bladder wall thickening. No perivesicular stranding. Stomach/Bowel: Wall thickening of the distal esophagus. Fluid-filled stomach without gastric wall thickening. Normal positioning of the ligament of Treitz. Detailed bowel assessment is limited in the absence of enteric contrast. Diminutive normal appendix, series 7, image 49. No appendicitis. Moderate stool burden throughout the colon. Mild gaseous distension of transverse colon. There is no colonic wall thickening or inflammation. Vascular/Lymphatic: Aorto bi-iliac atherosclerosis. No aortic aneurysm. Portal vein is patent. No enlarged abdominopelvic lymph nodes. Reproductive: Uterus surgically absent. No adnexal mass. Other: No free air or ascites. No evidence of intra-abdominal fluid collection. Musculoskeletal: Degenerative change in the lower lumbar spine. Degenerative change of both hips. Trabecular coarsening of the left inferior pubic ramus and ischium. IMPRESSION: 1. Wall thickening of the  distal esophagus, suspicious for esophagitis or reflux. 2. Slight heterogeneous nephrograms of both kidneys, may represent pyelonephritis in the setting of recent urinary tract infection. No focal fluid collection. 3. No CT findings of acute cholecystitis. 4. Trabecular coarsening of the left inferior pubic ramus and ischium, may be sequela of prior trauma or Paget's disease. Aortic Atherosclerosis (ICD10-I70.0). Electronically Signed   By: MKeith RakeM.D.   On: 09/03/2019 15:14   DG Chest Port 1 View  Result Date: 09/03/2019 CLINICAL DATA:  Sepsis. EXAM: PORTABLE CHEST 1 VIEW COMPARISON:  03/26/2018 FINDINGS: The heart size and pulmonary vascularity are normal. No infiltrates or effusions. Chronic peribronchial thickening. No acute bone abnormality. IMPRESSION: No acute abnormality. Electronically Signed   By: JLorriane ShireM.D.   On: 09/03/2019 12:02   [place] SITE RITE  Result Date: 09/05/2019 If Site Rite image not attached, placement could not be confirmed due to current cardiac rhythm.     Discharge Exam: Vitals:   09/06/19 0456 09/06/19 0553  BP: (!) 124/58   Pulse: 75 96  Resp: 18   Temp: 97.6 F (36.4 C)   SpO2: (!) 77% 96%  Vitals:   09/05/19 2106 09/05/19 2216 09/06/19 0456 09/06/19 0553  BP: 108/69  (!) 124/58   Pulse: 99 95 75 96  Resp: 17  18   Temp: 97.9 F (36.6 C)  97.6 F (36.4 C)   TempSrc: Oral  Oral   SpO2: (!) 87% 99% (!) 77% 96%  Weight:      Height:        General: Pt is alert, awake, not in acute distress Cardiovascular: RRR, S1/S2 +, no rubs, no gallops Respiratory: CTA bilaterally, no wheezing, no rhonchi Abdominal: Soft, NT, ND, bowel sounds + Extremities: no edema, no cyanosis, right-sided PICC line present    The results of significant diagnostics from this hospitalization (including imaging, microbiology, ancillary and laboratory) are listed below for reference.     Microbiology: Recent Results (from the past 240 hour(s))  Blood  Culture (routine x 2)     Status: None (Preliminary result)   Collection Time: 09/03/19 11:30 AM   Specimen: BLOOD RIGHT ARM  Result Value Ref Range Status   Specimen Description BLOOD RIGHT ARM DRAWN BY RN  Final   Special Requests   Final    BOTTLES DRAWN AEROBIC AND ANAEROBIC Blood Culture adequate volume   Culture   Final    NO GROWTH 2 DAYS Performed at [facility], [address] [place], [place] [address]    Report Status PENDING  Incomplete  Respiratory Panel by RT PCR (Flu A&B, Covid) - Urine, Clean Catch     Status: None   Collection Time: 09/03/19 11:32 AM   Specimen: Urine, Clean Catch  Result Value Ref Range Status   SARS Coronavirus 2 by RT PCR NEGATIVE NEGATIVE Final    Comment: (NOTE) SARS-CoV-2 target nucleic acids are NOT DETECTED. The SARS-CoV-2 RNA is generally detectable in upper respiratoy specimens during the acute phase of infection. The lowest concentration of SARS-CoV-2 viral copies this assay can detect is 131 copies/mL. A negative result does not preclude SARS-Cov-2 infection and should not be used as the sole basis for treatment or other patient management decisions. A negative result may occur with  improper specimen collection/handling, submission of specimen other than nasopharyngeal swab, presence of viral mutation(s) within the areas targeted by this assay, and inadequate number of viral copies (<131 copies/mL). A negative result must be combined with clinical observations, patient history, and epidemiological information. The expected result is Negative. Fact Sheet for Patients:  PinkCheek.be Fact Sheet for Healthcare Providers:  GravelBags.it This test is not yet ap proved or cleared by the [place] FDA and  has been authorized for detection and/or diagnosis of SARS-CoV-2 by FDA under an Emergency Use Authorization (EUA). This EUA will remain  in effect (meaning this test can be  used) for the duration of the COVID-19 declaration under Section 564(b)(1) of the Act, 21 U.S.C. section 360bbb-3(b)(1), unless the authorization is terminated or revoked sooner.    Influenza A by PCR NEGATIVE NEGATIVE Final   Influenza B by PCR NEGATIVE NEGATIVE Final    Comment: (NOTE) The Xpert Xpress SARS-CoV-2/FLU/RSV assay is intended as an aid in  the diagnosis of influenza from Nasopharyngeal swab specimens and  should not be used as a sole basis for treatment. Nasal washings and  aspirates are unacceptable for Xpert Xpress SARS-CoV-2/FLU/RSV  testing. Fact Sheet for Patients: PinkCheek.be Fact Sheet for Healthcare Providers: GravelBags.it This test is not yet approved or cleared by the [place] FDA and  has been authorized for detection and/or diagnosis of  SARS-CoV-2 by  FDA under an Emergency Use Authorization (EUA). This EUA will remain  in effect (meaning this test can be used) for the duration of the  Covid-19 declaration under Section 564(b)(1) of the Act, 21  U.S.C. section 360bbb-3(b)(1), unless the authorization is  terminated or revoked. Performed at [facility], [address], [place], [place] [address]   Blood Culture (routine x 2)     Status: None (Preliminary result)   Collection Time: 09/03/19 11:35 AM   Specimen: BLOOD RIGHT FOREARM  Result Value Ref Range Status   Specimen Description BLOOD RIGHT FOREARM  Final   Special Requests   Final    BOTTLES DRAWN AEROBIC AND ANAEROBIC Blood Culture results may not be optimal due to an inadequate volume of blood received in culture bottles   Culture   Final    NO GROWTH 2 DAYS Performed at [facility], [address] [place], [place] [address]    Report Status PENDING  Incomplete  Urine culture     Status: Abnormal   Collection Time: 09/03/19 11:40 AM   Specimen: In/Out Cath Urine  Result Value Ref Range Status   Specimen Description   Final     IN/OUT CATH URINE Performed at [facility], [address], [place], [place] [address]    Special Requests   Final    NONE Performed at [facility], [address] [place], [place] [address]    Culture (A)  Final    >=100,000 COLONIES/mL ESCHERICHIA COLI 50,000 COLONIES/mL PROTEUS MIRABILIS Confirmed Extended Spectrum Beta-Lactamase Producer (ESBL).  In bloodstream infections from ESBL organisms, carbapenems are preferred over piperacillin/tazobactam. They are shown to have a lower risk of mortality. FOR ECOL Performed at [facility], [place] [address], [place], [place] [address]    Report Status 09/05/2019 FINAL  Final   Organism ID, Bacteria ESCHERICHIA COLI (A)  Final   Organism ID, Bacteria PROTEUS MIRABILIS (A)  Final      Susceptibility   Escherichia coli - MIC*    AMPICILLIN >=32 RESISTANT Resistant     CEFAZOLIN >=64 RESISTANT Resistant     CEFTRIAXONE >=64 RESISTANT Resistant     CIPROFLOXACIN >=4 RESISTANT Resistant     GENTAMICIN 4 SENSITIVE Sensitive     IMIPENEM <=0.25 SENSITIVE Sensitive     NITROFURANTOIN <=16 SENSITIVE Sensitive     TRIMETH/SULFA >=320 RESISTANT Resistant     AMPICILLIN/SULBACTAM >=32 RESISTANT Resistant     PIP/TAZO 8 SENSITIVE Sensitive     * >=100,000 COLONIES/mL ESCHERICHIA COLI   Proteus mirabilis - MIC*    AMPICILLIN <=2 SENSITIVE Sensitive     CEFAZOLIN <=4 SENSITIVE Sensitive     CEFTRIAXONE <=0.25 SENSITIVE Sensitive     CIPROFLOXACIN >=4 RESISTANT Resistant     GENTAMICIN <=1 SENSITIVE Sensitive     IMIPENEM 4 SENSITIVE Sensitive     NITROFURANTOIN 256 RESISTANT Resistant     TRIMETH/SULFA <=20 SENSITIVE Sensitive     AMPICILLIN/SULBACTAM <=2 SENSITIVE Sensitive     PIP/TAZO <=4 SENSITIVE Sensitive     * 50,000 COLONIES/mL PROTEUS MIRABILIS     Labs: BNP (last 3 results) No results for input(s): BNP in the last 8760 hours. Basic Metabolic Panel: Recent Labs  Lab 09/03/19 1136 09/04/19 0539 09/05/19 0517 09/06/19 0758   NA 127* 130* 129* 135  K 4.1 3.9 3.8 3.7  CL 90* 96* 97* 99  CO2 23 23 21* 26  GLUCOSE 161* 98 100* 113*  BUN 28* 21 13 11  CREATININE 0.95 0.79 0.55 0.46  CALCIUM 9.1 8.5* 8.2* 8.4*  MG  --   --  2.2 2.0   Liver Function Tests: Recent Labs  Lab 09/03/19 1136  AST 20  ALT 16  ALKPHOS 66  BILITOT 0.9  PROT 7.5  ALBUMIN 3.9   Recent Labs  Lab 09/03/19 1136  LIPASE 20   No results for input(s): AMMONIA in the last 168 hours. CBC: Recent Labs  Lab 09/03/19 1136 09/04/19 0539 09/05/19 0517 09/06/19 0758  WBC 11.9* 9.8 6.0 6.5  NEUTROABS 10.3*  --   --   --   HGB 13.6 12.3 11.4* 11.5*  HCT 43.6 39.1 36.3 37.4  MCV 83.8 84.6 84.6 86.6  PLT 430* 380 378 408*   Cardiac Enzymes: No results for input(s): CKTOTAL, CKMB, CKMBINDEX, TROPONINI in the last 168 hours. BNP: Invalid input(s): POCBNP CBG: No results for input(s): GLUCAP in the last 168 hours. D-Dimer No results for input(s): DDIMER in the last 72 hours. Hgb A1c No results for input(s): HGBA1C in the last 72 hours. Lipid Profile No results for input(s): CHOL, HDL, LDLCALC, TRIG, CHOLHDL, LDLDIRECT in the last 72 hours. Thyroid function studies No results for input(s): TSH, T4TOTAL, T3FREE, THYROIDAB in the last 72 hours.  Invalid input(s): FREET3 Anemia work up No results for input(s): VITAMINB12, FOLATE, FERRITIN, TIBC, IRON, RETICCTPCT in the last 72 hours. Urinalysis    Component Value Date/Time   COLORURINE AMBER (A) 09/03/2019 1140   APPEARANCEUR CLOUDY (A) 09/03/2019 1140   LABSPEC 1.028 09/03/2019 1140   PHURINE 7.0 09/03/2019 1140   GLUCOSEU NEGATIVE 09/03/2019 1140   HGBUR NEGATIVE 09/03/2019 1140   BILIRUBINUR NEGATIVE 09/03/2019 1140   KETONESUR 20 (A) 09/03/2019 1140   PROTEINUR 30 (A) 09/03/2019 1140   NITRITE NEGATIVE 09/03/2019 1140   LEUKOCYTESUR MODERATE (A) 09/03/2019 1140   Sepsis Labs Invalid input(s): PROCALCITONIN,  WBC,  LACTICIDVEN Microbiology Recent Results (from the  past 240 hour(s))  Blood Culture (routine x 2)     Status: None (Preliminary result)   Collection Time: 09/03/19 11:30 AM   Specimen: BLOOD RIGHT ARM  Result Value Ref Range Status   Specimen Description BLOOD RIGHT ARM DRAWN BY RN  Final   Special Requests   Final    BOTTLES DRAWN AEROBIC AND ANAEROBIC Blood Culture adequate volume   Culture   Final    NO GROWTH 2 DAYS Performed at [facility], [address] [place], [place] [address]    Report Status PENDING  Incomplete  Respiratory Panel by RT PCR (Flu A&B, Covid) - Urine, Clean Catch     Status: None   Collection Time: 09/03/19 11:32 AM   Specimen: Urine, Clean Catch  Result Value Ref Range Status   SARS Coronavirus 2 by RT PCR NEGATIVE NEGATIVE Final    Comment: (NOTE) SARS-CoV-2 target nucleic acids are NOT DETECTED. The SARS-CoV-2 RNA is generally detectable in upper respiratoy specimens during the acute phase of infection. The lowest concentration of SARS-CoV-2 viral copies this assay can detect is 131 copies/mL. A negative result does not preclude SARS-Cov-2 infection and should not be used as the sole basis for treatment or other patient management decisions. A negative result may occur with  improper specimen collection/handling, submission of specimen other than nasopharyngeal swab, presence of viral mutation(s) within the areas targeted by this assay, and inadequate number of viral copies (<131 copies/mL). A negative result must be combined with clinical observations, patient history, and epidemiological information. The expected result  is Negative. Fact Sheet for Patients:  PinkCheek.be Fact Sheet for Healthcare Providers:  GravelBags.it This test is not yet ap proved or cleared by the [place] FDA and  has been authorized for detection and/or diagnosis of SARS-CoV-2 by FDA under an Emergency Use Authorization (EUA). This EUA will remain  in effect  (meaning this test can be used) for the duration of the COVID-19 declaration under Section 564(b)(1) of the Act, 21 U.S.C. section 360bbb-3(b)(1), unless the authorization is terminated or revoked sooner.    Influenza A by PCR NEGATIVE NEGATIVE Final   Influenza B by PCR NEGATIVE NEGATIVE Final    Comment: (NOTE) The Xpert Xpress SARS-CoV-2/FLU/RSV assay is intended as an aid in  the diagnosis of influenza from Nasopharyngeal swab specimens and  should not be used as a sole basis for treatment. Nasal washings and  aspirates are unacceptable for Xpert Xpress SARS-CoV-2/FLU/RSV  testing. Fact Sheet for Patients: PinkCheek.be Fact Sheet for Healthcare Providers: GravelBags.it This test is not yet approved or cleared by the [place] FDA and  has been authorized for detection and/or diagnosis of SARS-CoV-2 by  FDA under an Emergency Use Authorization (EUA). This EUA will remain  in effect (meaning this test can be used) for the duration of the  Covid-19 declaration under Section 564(b)(1) of the Act, 21  U.S.C. section 360bbb-3(b)(1), unless the authorization is  terminated or revoked. Performed at [facility], [address] [place], [place] [address]   Blood Culture (routine x 2)     Status: None (Preliminary result)   Collection Time: 09/03/19 11:35 AM   Specimen: BLOOD RIGHT FOREARM  Result Value Ref Range Status   Specimen Description BLOOD RIGHT FOREARM  Final   Special Requests   Final    BOTTLES DRAWN AEROBIC AND ANAEROBIC Blood Culture results may not be optimal due to an inadequate volume of blood received in culture bottles   Culture   Final    NO GROWTH 2 DAYS Performed at [facility], [address] [place], [place] [address]    Report Status PENDING  Incomplete  Urine culture     Status: Abnormal   Collection Time: 09/03/19 11:40 AM   Specimen: In/Out Cath Urine  Result Value Ref Range Status   Specimen  Description   Final    IN/OUT CATH URINE Performed at [facility], [address], [place], [place] [address]    Special Requests   Final    NONE Performed at [facility], [address], [place],  [address]    Culture (A)  Final    >=100,000 COLONIES/mL ESCHERICHIA COLI 50,000 COLONIES/mL PROTEUS MIRABILIS Confirmed Extended Spectrum Beta-Lactamase Producer (ESBL).  In bloodstream infections from ESBL organisms, carbapenems are preferred over piperacillin/tazobactam. They are shown to have a lower risk of mortality. FOR ECOL Performed at [facility], [place] [address] [place],  [address]    Report Status 09/05/2019 FINAL  Final   Organism ID, Bacteria ESCHERICHIA COLI (A)  Final   Organism ID, Bacteria PROTEUS MIRABILIS (A)  Final      Susceptibility   Escherichia coli - MIC*    AMPICILLIN >=32 RESISTANT Resistant     CEFAZOLIN >=64 RESISTANT Resistant     CEFTRIAXONE >=64 RESISTANT Resistant     CIPROFLOXACIN >=4 RESISTANT Resistant     GENTAMICIN 4 SENSITIVE Sensitive     IMIPENEM <=0.25 SENSITIVE Sensitive     NITROFURANTOIN <=16 SENSITIVE Sensitive     TRIMETH/SULFA >=320 RESISTANT Resistant  AMPICILLIN/SULBACTAM >=32 RESISTANT Resistant     PIP/TAZO 8 SENSITIVE Sensitive     * >=100,000 COLONIES/mL ESCHERICHIA COLI   Proteus mirabilis - MIC*    AMPICILLIN <=2 SENSITIVE Sensitive     CEFAZOLIN <=4 SENSITIVE Sensitive     CEFTRIAXONE <=0.25 SENSITIVE Sensitive     CIPROFLOXACIN >=4 RESISTANT Resistant     GENTAMICIN <=1 SENSITIVE Sensitive     IMIPENEM 4 SENSITIVE Sensitive     NITROFURANTOIN 256 RESISTANT Resistant     TRIMETH/SULFA <=20 SENSITIVE Sensitive     AMPICILLIN/SULBACTAM <=2 SENSITIVE Sensitive     PIP/TAZO <=4 SENSITIVE Sensitive     * 50,000 COLONIES/mL PROTEUS MIRABILIS     Time coordinating discharge: 35 minutes  SIGNED:   Rodena Goldmann, DO [facility] 09/06/2019, 9:50 AM  If 7PM-7AM, please contact  night-coverage [email or web address]

## 2019-09-08 ENCOUNTER — Other Ambulatory Visit (HOSPITAL_COMMUNITY)
Admission: RE | Admit: 2019-09-08 | Discharge: 2019-09-08 | Disposition: A | Discharge status: Home / Self Care | Payer: Medicare HMO | Source: Skilled Nursing Facility | Attending: Internal Medicine | Admitting: Internal Medicine

## 2019-09-08 ENCOUNTER — Non-Acute Institutional Stay (SKILLED_NURSING_FACILITY): Payer: Medicare HMO | Admitting: Adult Health

## 2019-09-08 ENCOUNTER — Encounter: Payer: Self-pay | Admitting: Adult Health

## 2019-09-08 DIAGNOSIS — I10 Essential (primary) hypertension: Secondary | ICD-10-CM | POA: Insufficient documentation

## 2019-09-08 DIAGNOSIS — Z1612 Extended spectrum beta lactamase (ESBL) resistance: Secondary | ICD-10-CM

## 2019-09-08 DIAGNOSIS — E876 Hypokalemia: Secondary | ICD-10-CM

## 2019-09-08 DIAGNOSIS — R3915 Urgency of urination: Secondary | ICD-10-CM

## 2019-09-08 DIAGNOSIS — H40053 Ocular hypertension, bilateral: Secondary | ICD-10-CM

## 2019-09-08 DIAGNOSIS — M1711 Unilateral primary osteoarthritis, right knee: Secondary | ICD-10-CM

## 2019-09-08 DIAGNOSIS — D649 Anemia, unspecified: Secondary | ICD-10-CM

## 2019-09-08 DIAGNOSIS — N10 Acute tubulo-interstitial nephritis: Secondary | ICD-10-CM

## 2019-09-08 DIAGNOSIS — A499 Bacterial infection, unspecified: Secondary | ICD-10-CM

## 2019-09-08 LAB — CBC WITH DIFFERENTIAL/PLATELET
Abs Immature Granulocytes: 0.02 10*3/uL (ref 0.00–0.07)
Basophils Absolute: 0 10*3/uL (ref 0.0–0.1)
Basophils Relative: 0 %
Eosinophils Absolute: 0.2 10*3/uL (ref 0.0–0.5)
Eosinophils Relative: 3 %
HCT: 33.7 % — ABNORMAL LOW (ref 36.0–46.0)
Hemoglobin: 10.6 g/dL — ABNORMAL LOW (ref 12.0–15.0)
Immature Granulocytes: 0 %
Lymphocytes Relative: 29 %
Lymphs Abs: 1.7 10*3/uL (ref 0.7–4.0)
MCH: 27.2 pg (ref 26.0–34.0)
MCHC: 31.5 g/dL (ref 30.0–36.0)
MCV: 86.4 fL (ref 80.0–100.0)
Monocytes Absolute: 0.6 10*3/uL (ref 0.1–1.0)
Monocytes Relative: 10 %
Neutro Abs: 3.5 10*3/uL (ref 1.7–7.7)
Neutrophils Relative %: 58 %
Platelets: 389 10*3/uL (ref 150–400)
RBC: 3.9 MIL/uL (ref 3.87–5.11)
RDW: 15.3 % (ref 11.5–15.5)
WBC: 6.1 10*3/uL (ref 4.0–10.5)
nRBC: 0 % (ref 0.0–0.2)

## 2019-09-08 LAB — BASIC METABOLIC PANEL
Anion gap: 7 (ref 5–15)
BUN: 13 mg/dL (ref 8–23)
CO2: 27 mmol/L (ref 22–32)
Calcium: 8.3 mg/dL — ABNORMAL LOW (ref 8.9–10.3)
Chloride: 100 mmol/L (ref 98–111)
Creatinine, Ser: 0.54 mg/dL (ref 0.44–1.00)
GFR calc Af Amer: >60 mL/min (ref >60)
GFR calc non Af Amer: >60 mL/min (ref >60)
Glucose, Bld: 100 mg/dL — ABNORMAL HIGH (ref 70–99)
Potassium: 4.2 mmol/L (ref 3.5–5.1)
Sodium: 134 mmol/L — ABNORMAL LOW (ref 135–145)

## 2019-09-08 LAB — SEDIMENTATION RATE: Sed Rate: 23 mm/hr — ABNORMAL HIGH (ref 0–22)

## 2019-09-08 LAB — CULTURE, BLOOD (ROUTINE X 2)
Culture: NO GROWTH
Culture: NO GROWTH
Special Requests: ADEQUATE

## 2019-09-08 LAB — C-REACTIVE PROTEIN: CRP: 2.5 mg/dL — ABNORMAL HIGH

## 2019-09-08 NOTE — Progress Notes (Signed)
Location:    [place] Room Number: 151/P Place of Service:  SNF (31)   CODE STATUS: Full Code  No Known Allergies  Chief Complaint  Patient presents with  . Hospitalization Follow-up    Hospitalization Follow Up    HPI:  She is a 85 year old resident of assisted living who has been hospitalized from 09-03-19 through 09-06-19. She was treated for sepsis with ESBL due to pyelonephritis. She remains on Invaz through 09-12-19. Her goal is to return back to the assisted living. There are no reports of pain; no reports of changes in appetite; no reports of agitation or anxiety. She will continue to be followed for her chronic illnesses including: hypertension; urinary urgency; right knee arthritis.   Past Medical History:  Diagnosis Date  . Anemia   . Diabetes mellitus without complication ([place])   . Glaucoma   . Hypercholesteremia   . Hypertension   . Hypokalemia   . Paraplegia ([place])   . Status post total right knee replacement     Past Surgical History:  Procedure Laterality Date  . ABDOMINAL HYSTERECTOMY    . CATARACT EXTRACTION    . COLONOSCOPY N/A 12/10/2013   Procedure: COLONOSCOPY;  Surgeon: Danie Binder, MD;  Location: [facility];  Service: Endoscopy;  Laterality: N/A;  8:30 AM  . TOTAL KNEE ARTHROPLASTY Right 03/30/2016   Procedure: TOTAL KNEE ARTHROPLASTY;  Surgeon: Carole Civil, MD;  Location: [facility] ORS;  Service: Orthopedics;  Laterality: Right;    Social History   Socioeconomic History  . Marital status: Divorced    Spouse name: Not on file  . Number of children: Not on file  . Years of education: Not on file  . Highest education level: Not on file  Occupational History  . Not on file  Tobacco Use  . Smoking status: Current Some Day Smoker    Packs/day: 0.25    Years: 3.00    Pack years: 0.75    Types: Cigarettes  . Smokeless tobacco: Never Used  Substance and Sexual Activity  . Alcohol use: No  . Drug use: No  . Sexual  activity: Not Currently    Birth control/protection: Post-menopausal  Other Topics Concern  . Not on file  Social History Narrative  . Not on file   Social Determinants of Health   Financial Resource Strain:   . Difficulty of Paying Living Expenses: Not on file  Food Insecurity:   . Worried About Charity fundraiser in the Last Year: Not on file  . Ran Out of Food in the Last Year: Not on file  Transportation Needs:   . Lack of Transportation (Medical): Not on file  . Lack of Transportation (Non-Medical): Not on file  Physical Activity:   . Days of Exercise per Week: Not on file  . Minutes of Exercise per Session: Not on file  Stress:   . Feeling of Stress : Not on file  Social Connections:   . Frequency of Communication with Friends and Family: Not on file  . Frequency of Social Gatherings with Friends and Family: Not on file  . Attends Religious Services: Not on file  . Active Member of Clubs or Organizations: Not on file  . Attends Archivist Meetings: Not on file  . Marital Status: Not on file  Intimate Partner Violence:   . Fear of Current or Ex-Partner: Not on file  . Emotionally Abused: Not on file  . Physically Abused:  Not on file  . Sexually Abused: Not on file   Family History  Problem Relation Age of Onset  . Cancer Mother   . Arthritis Mother   . Hypotension Father   . Cancer Sister   . Hypotension Sister   . Colon cancer Neg Hx       VITAL SIGNS BP 115/65   Pulse 68   Temp 97.9 F (36.6 C) (Oral)   Resp 18   Ht '5\' 5"'$  (1.651 m)   Wt 194 lb 12.8 oz (88.4 kg)   SpO2 97%   BMI 32.42 kg/m   Outpatient Encounter Medications as of 09/08/2019  Medication Sig  . acetaminophen (TYLENOL) 325 MG tablet Take 650 mg by mouth 3 (three) times daily.   . Amino Acids-Protein Hydrolys (FEEDING SUPPLEMENT, PRO-STAT SUGAR FREE 64,) LIQD Take 30 mLs by mouth 2 (two) times daily.  . calcium citrate (CALCITRATE - DOSED IN MG ELEMENTAL CALCIUM) 950 MG  tablet Take 200 mg of elemental calcium by mouth daily.  . diclofenac Sodium (VOLTAREN) 1 % GEL Apply 2-4 g topically 3 (three) times daily.  . ertapenem (INVANZ) IVPB Inject 1 g into the vein daily for 6 days. Indication:  ESBL e. Coli UTI Last Day of Therapy:  09/11/19 Labs - Once weekly:  CBC/D and BMP, Labs - Every other week:  ESR and CRP  . iron polysaccharides (FERREX 150) 150 MG capsule Take 1 capsule by mouth daily.  Marland Kitchen latanoprost (XALATAN) 0.005 % ophthalmic solution Place 1 drop into both eyes at bedtime.  . mupirocin ointment (BACTROBAN) 2 % Place 1 application into the nose 2 (two) times daily. X 5 days  . naproxen (NAPROSYN) 500 MG tablet Take 1 tablet by mouth every 12 (twelve) hours. 1 tab every 12 hours as needed for arthritis at the knee  . NON FORMULARY Diet: NAS,  . oxybutynin (DITROPAN-XL) 10 MG 24 hr tablet Take 10 mg by mouth daily.  . Potassium Chloride ER 20 MEQ TBCR Take 20 mEq by mouth daily at 6 (six) AM.  . TRAVATAN Z 0.004 % SOLN ophthalmic solution Place 1 drop into both eyes at bedtime.  . vitamin C (ASCORBIC ACID) 250 MG tablet Take 500 mg by mouth daily.  Marland Kitchen zinc oxide 20 % ointment Apply topically 2 (two) times daily. To buttocks and prn soilage  . [DISCONTINUED] nystatin-triamcinolone (MYCOLOG II) cream Apply to affected area daily   No facility-administered encounter medications on file as of 09/08/2019.     SIGNIFICANT DIAGNOSTIC EXAMS  TODAY  09-03-19: chest x-ray: No acute abnormality.   09-03-19: ct of abdomen and pelvis:  1. Wall thickening of the distal esophagus, suspicious foresophagitis or reflux. 2. Slight heterogeneous nephrograms of both kidneys, may representpyelonephritis in the setting of recent urinary tract infection. No focal fluid collection. 3. No CT findings of acute cholecystitis. 4. Trabecular coarsening of the left inferior pubic ramus and ischium, may be sequela of prior trauma or Paget's disease. Aortic Atherosclerosis    09-04-19: right ankle x-ray: 1. No fracture, bone lesion or ankle joint abnormality.2. No evidence of osteomyelitis.  LABS REVIEWED TODAY;   09-03-19: wbc 11.9; hgb 13.6; hct 43.6; mcv 83.8 plt 430; glucose 161; bun 28; creat 0.95 ;k+ 4.1; na++ 127; ca 9.1 liver normal albumin 3.9 urine culture: e-coli ESBL and 50,000 proteus mirabilis 09-08-19: wbc 6.1 hgb 10.6; hct 33.7; mcv 86.4 plt 389; glucose 100; bun 13; creat 0.54; k+ 4.2; na++ 134; ca 8.3  Review of Systems  Unable to perform ROS: Dementia (unable to participate )    Physical Exam Constitutional:      General: She is not in acute distress.    Appearance: She is well-developed. She is not diaphoretic.  Eyes:     Comments: Cataract surgery   Neck:     Thyroid: No thyromegaly.  Cardiovascular:     Rate and Rhythm: Normal rate and regular rhythm.     Pulses: Normal pulses.     Heart sounds: Normal heart sounds.  Pulmonary:     Effort: Pulmonary effort is normal. No respiratory distress.     Breath sounds: Normal breath sounds.  Abdominal:     General: Bowel sounds are normal. There is no distension.     Palpations: Abdomen is soft.     Tenderness: There is no abdominal tenderness.  Musculoskeletal:     Cervical back: Neck supple.     Right lower leg: No edema.     Left lower leg: No edema.     Comments: Is able to move all extremities  History of right knee replacement   Lymphadenopathy:     Cervical: No cervical adenopathy.  Skin:    General: Skin is warm and dry.  Neurological:     Mental Status: She is alert. Mental status is at baseline.  Psychiatric:        Mood and Affect: Mood normal.      ASSESSMENT/ PLAN:  TODAY;   1. Acute pyelonephritis/ESBL producing infection: is stable will complete invanz through 09-11-19 will monitor her status.   2. Primary osteoarthritis right knee: is stable will continue tylenol 650 mg three times daily and voltaren gel 4 gm to right knee three times daily   3.  Essential benign HTN: is stable b/p 115/65 will monitor  4. Chronic anemia: is stable hgb 10.6 will continue ferrex daily   5. Urinary urgency: is stable will continue ditropan xl 10 mg daily   6. Hypokalemia: is stable k+ 4.2 will continue k+ 20 meq daily   7. Increased ocular pressure bilateral: is stable will continue xalatan to both eyes.    MD is aware of resident's narcotic use and is in agreement with current plan of care. We will attempt to wean resident as appropriate.  Ok Edwards NP [facility]  Contact [phone] Monday through Friday 8am- 5pm  After hours call [phone]

## 2019-09-09 ENCOUNTER — Non-Acute Institutional Stay (SKILLED_NURSING_FACILITY): Payer: Medicare HMO | Admitting: Adult Health

## 2019-09-09 DIAGNOSIS — N10 Acute tubulo-interstitial nephritis: Secondary | ICD-10-CM

## 2019-09-09 DIAGNOSIS — F0390 Unspecified dementia without behavioral disturbance: Secondary | ICD-10-CM | POA: Diagnosis not present

## 2019-09-09 DIAGNOSIS — D649 Anemia, unspecified: Secondary | ICD-10-CM

## 2019-09-09 NOTE — Progress Notes (Signed)
Location:    [place] Room Number: 151/P Place of Service:  SNF (31)   CODE STATUS: Full Code  No Known Allergies  Chief Complaint  Patient presents with  . Acute Visit    72-hour Care Plan Meeting     HPI:  We have come together for her 27 hour care plan meeting. She had been a resident of assisted living prior to her hospitalization. She was wheelchair bound. Her goal is to return back to her assisted living. There are no reports of pain; no coughing no shortness of breath. She continues to be followed for her chronic illnesses including: pyelonephritis; dementia; anemia.   Past Medical History:  Diagnosis Date  . Anemia   . Diabetes mellitus without complication ([place])   . Glaucoma   . Hypercholesteremia   . Hypertension   . Hypokalemia   . Paraplegia ([place])   . Status post total right knee replacement     Past Surgical History:  Procedure Laterality Date  . ABDOMINAL HYSTERECTOMY    . CATARACT EXTRACTION    . COLONOSCOPY N/A 12/10/2013   Procedure: COLONOSCOPY;  Surgeon: Danie Binder, MD;  Location[facility];  Service: Endoscopy;  Laterality: N/A;  8:30 AM  . TOTAL KNEE ARTHROPLASTY Right 03/30/2016   Procedure: TOTAL KNEE ARTHROPLASTY;  Surgeon: Carole Civil, MD;  Location: [facility];  Service: Orthopedics;  Laterality: Right;    Social History   Socioeconomic History  . Marital status: Divorced    Spouse name: Not on file  . Number of children: Not on file  . Years of education: Not on file  . Highest education level: Not on file  Occupational History  . Not on file  Tobacco Use  . Smoking status: Current Some Day Smoker    Packs/day: 0.25    Years: 3.00    Pack years: 0.75    Types: Cigarettes  . Smokeless tobacco: Never Used  Substance and Sexual Activity  . Alcohol use: No  . Drug use: No  . Sexual activity: Not Currently    Birth control/protection: Post-menopausal  Other Topics Concern  . Not on file  Social  History Narrative  . Not on file   Social Determinants of Health   Financial Resource Strain:   . Difficulty of Paying Living Expenses: Not on file  Food Insecurity:   . Worried About Charity fundraiser in the Last Year: Not on file  . Ran Out of Food in the Last Year: Not on file  Transportation Needs:   . Lack of Transportation (Medical): Not on file  . Lack of Transportation (Non-Medical): Not on file  Physical Activity:   . Days of Exercise per Week: Not on file  . Minutes of Exercise per Session: Not on file  Stress:   . Feeling of Stress : Not on file  Social Connections:   . Frequency of Communication with Friends and Family: Not on file  . Frequency of Social Gatherings with Friends and Family: Not on file  . Attends Religious Services: Not on file  . Active Member of Clubs or Organizations: Not on file  . Attends Archivist Meetings: Not on file  . Marital Status: Not on file  Intimate Partner Violence:   . Fear of Current or Ex-Partner: Not on file  . Emotionally Abused: Not on file  . Physically Abused: Not on file  . Sexually Abused: Not on file   Family History  Problem Relation Age of Onset  . Cancer Mother   . Arthritis Mother   . Hypotension Father   . Cancer Sister   . Hypotension Sister   . Colon cancer Neg Hx       VITAL SIGNS There were no vitals taken for this visit.  Outpatient Encounter Medications as of 09/09/2019  Medication Sig  . acetaminophen (TYLENOL) 325 MG tablet Take 650 mg by mouth 3 (three) times daily.   . Amino Acids-Protein Hydrolys (FEEDING SUPPLEMENT, PRO-STAT SUGAR FREE 64,) LIQD Take 30 mLs by mouth 2 (two) times daily.  . calcium citrate (CALCITRATE - DOSED IN MG ELEMENTAL CALCIUM) 950 MG tablet Take 200 mg of elemental calcium by mouth daily.  . diclofenac Sodium (VOLTAREN) 1 % GEL Apply 2-4 g topically 3 (three) times daily.  . ertapenem (INVANZ) IVPB Inject 1 g into the vein daily for 6 days. Indication:  ESBL  e. Coli UTI Last Day of Therapy:  09/11/19 Labs - Once weekly:  CBC/D and BMP, Labs - Every other week:  ESR and CRP  . iron polysaccharides (FERREX 150) 150 MG capsule Take 1 capsule by mouth daily.  Marland Kitchen latanoprost (XALATAN) 0.005 % ophthalmic solution Place 1 drop into both eyes at bedtime.  . naproxen (NAPROSYN) 500 MG tablet Take 1 tablet by mouth every 12 (twelve) hours. 1 tab every 12 hours as needed for arthritis at the knee  . NON FORMULARY Diet: NAS,  . oxybutynin (DITROPAN-XL) 10 MG 24 hr tablet Take 10 mg by mouth daily.  . Potassium Chloride ER 20 MEQ TBCR Take 20 mEq by mouth daily at 6 (six) AM.  . vitamin C (ASCORBIC ACID) 250 MG tablet Take 500 mg by mouth daily.  Marland Kitchen zinc oxide 20 % ointment Apply topically 2 (two) times daily. To buttocks and prn soilage  . [DISCONTINUED] mupirocin ointment (BACTROBAN) 2 % Place 1 application into the nose 2 (two) times daily. X 5 days  . [DISCONTINUED] TRAVATAN Z 0.004 % SOLN ophthalmic solution Place 1 drop into both eyes at bedtime.   No facility-administered encounter medications on file as of 09/09/2019.     SIGNIFICANT DIAGNOSTIC EXAMS   PREVIOUS  09-03-19: chest x-ray: No acute abnormality.   09-03-19: ct of abdomen and pelvis:  1. Wall thickening of the distal esophagus, suspicious foresophagitis or reflux. 2. Slight heterogeneous nephrograms of both kidneys, may representpyelonephritis in the setting of recent urinary tract infection. No focal fluid collection. 3. No CT findings of acute cholecystitis. 4. Trabecular coarsening of the left inferior pubic ramus and ischium, may be sequela of prior trauma or Paget's disease. Aortic Atherosclerosis   09-04-19: right ankle x-ray: 1. No fracture, bone lesion or ankle joint abnormality.2. No evidence of osteomyelitis.  NO NEW EXAMS.   LABS REVIEWED PREVIOUS;   09-03-19: wbc 11.9; hgb 13.6; hct 43.6; mcv 83.8 plt 430; glucose 161; bun 28; creat 0.95 ;k+ 4.1; na++ 127; ca 9.1 liver  normal albumin 3.9 urine culture: e-coli ESBL and 50,000 proteus mirabilis 09-08-19: wbc 6.1 hgb 10.6; hct 33.7; mcv 86.4 plt 389; glucose 100; bun 13; creat 0.54; k+ 4.2; na++ 134; ca 8.3  NO NEW LABS.   Review of Systems  Unable to perform ROS: Dementia (unable to participate )   Physical Exam Constitutional:      General: She is not in acute distress.    Appearance: She is well-developed. She is not diaphoretic.  Eyes:     Comments: Cataract surgery   Neck:  Thyroid: No thyromegaly.  Cardiovascular:     Rate and Rhythm: Normal rate and regular rhythm.     Heart sounds: Normal heart sounds.  Pulmonary:     Effort: Pulmonary effort is normal. No respiratory distress.     Breath sounds: Normal breath sounds.  Abdominal:     General: Bowel sounds are normal. There is no distension.     Palpations: Abdomen is soft.     Tenderness: There is no abdominal tenderness.  Musculoskeletal:     Right lower leg: No edema.     Left lower leg: No edema.     Comments: Is able to move all extremities  History of right knee replacement    Lymphadenopathy:     Cervical: No cervical adenopathy.  Skin:    General: Skin is warm and dry.  Neurological:     Mental Status: She is alert. Mental status is at baseline.  Psychiatric:        Mood and Affect: Mood normal.       ASSESSMENT/ PLAN:  TODAY  1. Acute pyelonephritis 2. Dementia without behavioral disturbance unspecified dementia type 3. Chronic anemia  Will continue therapy as directed Will continue current medications Will continue to monitor her status Her goal is to return back to assisted living   MD is aware of resident's narcotic use and is in agreement with current plan of care. We will attempt to wean resident as appropriate.  Ok Edwards NP [facility]  Contact [phone] Monday through Friday 8am- 5pm  After hours call [phone]

## 2019-09-10 ENCOUNTER — Other Ambulatory Visit (HOSPITAL_COMMUNITY)
Admission: RE | Admit: 2019-09-10 | Discharge: 2019-09-10 | Disposition: A | Discharge status: Home / Self Care | Payer: Medicare HMO | Source: Skilled Nursing Facility | Attending: Internal Medicine | Admitting: Internal Medicine

## 2019-09-10 ENCOUNTER — Encounter: Payer: Self-pay | Admitting: Internal Medicine

## 2019-09-10 ENCOUNTER — Non-Acute Institutional Stay (SKILLED_NURSING_FACILITY): Payer: Medicare HMO | Admitting: Internal Medicine

## 2019-09-10 DIAGNOSIS — A4151 Sepsis due to Escherichia coli [E. coli]: Secondary | ICD-10-CM

## 2019-09-10 DIAGNOSIS — N179 Acute kidney failure, unspecified: Secondary | ICD-10-CM

## 2019-09-10 DIAGNOSIS — N10 Acute tubulo-interstitial nephritis: Secondary | ICD-10-CM | POA: Diagnosis present

## 2019-09-10 DIAGNOSIS — D649 Anemia, unspecified: Secondary | ICD-10-CM

## 2019-09-10 DIAGNOSIS — H40053 Ocular hypertension, bilateral: Secondary | ICD-10-CM | POA: Insufficient documentation

## 2019-09-10 DIAGNOSIS — N3281 Overactive bladder: Secondary | ICD-10-CM

## 2019-09-10 DIAGNOSIS — R3915 Urgency of urination: Secondary | ICD-10-CM | POA: Insufficient documentation

## 2019-09-10 DIAGNOSIS — E871 Hypo-osmolality and hyponatremia: Secondary | ICD-10-CM | POA: Diagnosis not present

## 2019-09-10 DIAGNOSIS — A499 Bacterial infection, unspecified: Secondary | ICD-10-CM | POA: Diagnosis not present

## 2019-09-10 DIAGNOSIS — E876 Hypokalemia: Secondary | ICD-10-CM

## 2019-09-10 DIAGNOSIS — I10 Essential (primary) hypertension: Secondary | ICD-10-CM

## 2019-09-10 DIAGNOSIS — Z1612 Extended spectrum beta lactamase (ESBL) resistance: Secondary | ICD-10-CM

## 2019-09-10 LAB — CBC WITH DIFFERENTIAL/PLATELET
Abs Immature Granulocytes: 0.03 10*3/uL (ref 0.00–0.07)
Basophils Absolute: 0 10*3/uL (ref 0.0–0.1)
Basophils Relative: 1 %
Eosinophils Absolute: 0.2 10*3/uL (ref 0.0–0.5)
Eosinophils Relative: 5 %
HCT: 35.8 % — ABNORMAL LOW (ref 36.0–46.0)
Hemoglobin: 11.2 g/dL — ABNORMAL LOW (ref 12.0–15.0)
Immature Granulocytes: 1 %
Lymphocytes Relative: 32 %
Lymphs Abs: 1.5 10*3/uL (ref 0.7–4.0)
MCH: 26.9 pg (ref 26.0–34.0)
MCHC: 31.3 g/dL (ref 30.0–36.0)
MCV: 85.9 fL (ref 80.0–100.0)
Monocytes Absolute: 0.6 10*3/uL (ref 0.1–1.0)
Monocytes Relative: 14 %
Neutro Abs: 2.2 10*3/uL (ref 1.7–7.7)
Neutrophils Relative %: 47 %
Platelets: ADEQUATE 10*3/uL (ref 150–400)
RBC: 4.17 MIL/uL (ref 3.87–5.11)
RDW: 15.3 % (ref 11.5–15.5)
WBC: 4.6 10*3/uL (ref 4.0–10.5)
nRBC: 0 % (ref 0.0–0.2)

## 2019-09-10 LAB — BASIC METABOLIC PANEL
Anion gap: 8 (ref 5–15)
BUN: 6 mg/dL — ABNORMAL LOW (ref 8–23)
CO2: 27 mmol/L (ref 22–32)
Calcium: 8.6 mg/dL — ABNORMAL LOW (ref 8.9–10.3)
Chloride: 99 mmol/L (ref 98–111)
Creatinine, Ser: 0.54 mg/dL (ref 0.44–1.00)
GFR calc Af Amer: >60 mL/min (ref >60)
GFR calc non Af Amer: >60 mL/min (ref >60)
Glucose, Bld: 105 mg/dL — ABNORMAL HIGH (ref 70–99)
Potassium: 4.3 mmol/L (ref 3.5–5.1)
Sodium: 134 mmol/L — ABNORMAL LOW (ref 135–145)

## 2019-09-10 LAB — SEDIMENTATION RATE: Sed Rate: 31 mm/hr — ABNORMAL HIGH (ref 0–22)

## 2019-09-10 LAB — C-REACTIVE PROTEIN: CRP: 1.4 mg/dL — ABNORMAL HIGH (ref <1.0)

## 2019-09-10 NOTE — Progress Notes (Signed)
: Provider:  Randon Goldsmith. Lyn Hollingshead, MD Location:  [facility] Room Number: 151-P Place of Service:  SNF ([phone])  PCP: [name], MD Patient Care Team: [name], MD as PCP - General [facility] Medicine)  Extended Emergency Contact Information Primary Emergency Contact: [name], [place] [address] [place] Home Phone: [phone] Relation: Relative Secondary Emergency Contact: [name], [place] [address] [place] of [place] Home Phone: [phone] Relation: Relative     Allergies: Patient has no known allergies.  Chief Complaint  Patient presents with  . New Admit To SNF    New admission to [facility]    HPI: Patient is an 85 y.o. female with hypertension, paraplegia, dementia, who was diagnosed with a UTI as an outpatient and started on Bactrim 3 days ago.  It was reported patient was noted to be slightly confused with episode of vomiting at the nursing home before being sent to the hospital.  UA UA was suggestive of a UTI.  Patient was admitted to [facility] from 2/10-13 where she was treated for sepsis secondary to acute pyelonephritis secondary to ESBL UTI which was treated with ertapenem, course completed.  Patient had mild renal insufficiency prerenal and hypovolemic hyponatremia which improved with IV fluids.  Patient is admitted back to skilled nursing facility.  Skilled nursing facility patient will be followed for overactive bladder treated with pain, anemia treated with Serax and hypokalemia treated with replacement.  Past Medical History:  Diagnosis Date  . Anemia   . Diabetes mellitus without complication (HCC)   . Glaucoma   . Hypercholesteremia   . Hypertension   . Hypokalemia   . Paraplegia (HCC)   . Status post total right knee replacement     Past Surgical History:  Procedure Laterality Date  . ABDOMINAL HYSTERECTOMY    . CATARACT EXTRACTION    . COLONOSCOPY  N/A 12/10/2013   Procedure: COLONOSCOPY;  Surgeon: West Bali, MD;  [facility]: [facility];  Service: Endoscopy;  Laterality: N/A;  8:30 AM  . TOTAL KNEE ARTHROPLASTY Right 03/30/2016   Procedure: TOTAL KNEE ARTHROPLASTY;  Surgeon: Vickki Hearing, MD;  Location: [facility] ORS;  Service: Orthopedics;  Laterality: Right;    Allergies as of 09/10/2019   No Known Allergies     Medication List    Notice   This visit is during an admission. Changes to the med list made in this visit will be reflected in the After Visit Summary of the admission.    Current Outpatient Medications on File Prior to Visit  Medication Sig Dispense Refill  . acetaminophen (TYLENOL) 325 MG tablet Take 650 mg by mouth 3 (three) times daily.     . Amino Acids-Protein Hydrolys (FEEDING SUPPLEMENT, PRO-STAT SUGAR FREE 64,) LIQD Take 30 mLs by mouth 2 (two) times daily.    . calcium citrate (CALCITRATE - DOSED IN MG ELEMENTAL CALCIUM) 950 MG tablet Take 200 mg of elemental calcium by mouth daily.    . diclofenac Sodium (VOLTAREN) 1 % GEL Apply 4 g topically 3 (three) times daily.     . iron polysaccharides (FERREX 150) 150 MG capsule Take 1 capsule by mouth daily.    Marland Kitchen latanoprost (XALATAN) 0.005 % ophthalmic solution Place 1 drop into both eyes at bedtime.    . naproxen (NAPROSYN) 500 MG tablet Take 1 tablet by mouth every 12 (twelve)  hours. 1 tab every 12 hours as needed for arthritis at the knee    . NON FORMULARY Diet: NAS,    . oxybutynin (DITROPAN-XL) 10 MG 24 hr tablet Take 10 mg by mouth daily.    . Potassium Chloride ER 20 MEQ TBCR Take 20 mEq by mouth daily at 6 (six) AM.    . vitamin C (ASCORBIC ACID) 250 MG tablet Take 500 mg by mouth daily.    Marland Kitchen zinc oxide 20 % ointment Apply topically 2 (two) times daily. To buttocks and prn soilage 56.7 g 0   No current facility-administered medications on file prior to visit.     No orders of the defined types were placed in this encounter.   Immunization History    Administered Date(s) Administered  . PPD Test 10/03/2016    Social History   Tobacco Use  . Smoking status: Current Some Day Smoker    Packs/day: 0.25    Years: 3.00    Pack years: 0.75    Types: Cigarettes  . Smokeless tobacco: Never Used  Substance Use Topics  . Alcohol use: No    Family history is   Family History  Problem Relation Age of Onset  . Cancer Mother   . Arthritis Mother   . Hypotension Father   . Cancer Sister   . Hypotension Sister   . Colon cancer Neg Hx       Review of Systems unable to obtain secondary to dementia      Vitals:   09/10/19 1325  BP: 132/69  Pulse: 88  Resp: 20  Temp: (!) 97.4 F (36.3 C)  SpO2: 95%    SpO2 Readings from Last 1 Encounters:  09/10/19 95%   Body mass index is 31.44 kg/m.     Physical Exam  GENERAL APPEARANCE: Alert,  No acute distress.  SKIN: No diaphoresis rash HEAD: Normocephalic, atraumatic  EYES: Conjunctiva/lids clear. Pupils round, reactive. EOMs intact.  EARS: External exam WNL, canals clear. Hearing grossly normal.  NOSE: No deformity or discharge.  MOUTH/THROAT: Lips w/o lesions  RESPIRATORY: Breathing is even, unlabored. Lung sounds are clear   CARDIOVASCULAR: Heart RRR no murmurs, rubs or gallops. No peripheral edema.   GASTROINTESTINAL: Abdomen is soft, non-tender, not distended w/ normal bowel sounds. GENITOURINARY: Bladder non tender, not distended  MUSCULOSKELETAL: No abnormal joints or musculature NEUROLOGIC:  Cranial nerves 2-12 grossly intact; paraplegia PSYCHIATRIC: Mood and affect with dementia, no behavioral issues  Patient Active Problem List   Diagnosis Date Noted  . Urinary urgency 09/10/2019  . Increased intraocular pressure, bilateral 09/10/2019  . ESBL (extended spectrum beta-lactamase) producing bacteria infection 09/05/2019  . Sepsis (HCC) 09/03/2019  . Acute pyelonephritis 09/03/2019  . Dementia (HCC) 09/03/2019  . Sepsis due to Enterococcus (HCC) 12/14/2016   . Pressure injury of skin 12/13/2016  . Acute metabolic encephalopathy 12/13/2016  . Sepsis due to undetermined organism (HCC) 12/13/2016  . Fall 12/12/2016  . Living accommodation issues 12/12/2016  . Altered mental status 12/12/2016  . Decubitus ulcer of right ischium, unstageable (HCC)   . UTI (urinary tract infection) 12/03/2016  . Benign essential HTN 12/02/2016  . Immobility 09/30/2016  . Hypokalemia 09/30/2016  . Pressure ulcer, stage 2 (HCC) 09/30/2016  . Acute renal failure (HCC) 09/30/2016  . Hypothermia 09/30/2016  . 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 Chronic anemia 04/02/2016  . Hyponatremia 04/02/2016  . Hyperglycemia 04/02/2016  . Femur fracture, right (HCC) 04/02/2016  . Osteoarthritis of  right knee 03/30/2016  . Arthritis of knee, right       Labs reviewed: Basic Metabolic Panel:    Component Value Date/Time   NA 134 (L) 09/10/2019 0925   NA 136 (A) 10/09/2016 0000   K 4.3 09/10/2019 0925   CL 99 09/10/2019 0925   CO2 27 09/10/2019 0925   GLUCOSE 105 (H) 09/10/2019 0925   BUN 6 (L) 09/10/2019 0925   BUN 8 10/09/2016 0000   CREATININE 0.54 09/10/2019 0925   CALCIUM 8.6 (L) 09/10/2019 0925   PROT 7.5 09/03/2019 1136   ALBUMIN 3.9 09/03/2019 1136   AST 20 09/03/2019 1136   ALT 16 09/03/2019 1136   ALKPHOS 66 09/03/2019 1136   BILITOT 0.9 09/03/2019 1136   GFRNONAA >60 09/10/2019 0925   GFRAA >60 09/10/2019 0925    Recent Labs    09/05/19 0517 09/05/19 0517 09/06/19 0758 09/08/19 0854 09/10/19 0925  NA 129*   < > 135 134* 134*  K 3.8   < > 3.7 4.2 4.3  CL 97*   < > 99 100 99  CO2 21*   < > 26 27 27   GLUCOSE 100*   < > 113* 100* 105*  BUN 13   < > 11 13 6*  CREATININE 0.55   < > 0.46 0.54 0.54  CALCIUM 8.2*   < > 8.4* 8.3* 8.6*  MG 2.2  --  2.0  --   --    < > = values in this interval not displayed.   Liver Function Tests: Recent Labs    09/03/19 1136  AST 20  ALT 16  ALKPHOS 66   BILITOT 0.9  PROT 7.5  ALBUMIN 3.9   Recent Labs    09/03/19 1136  LIPASE 20   No results for input(s): AMMONIA in the last 8760 hours. CBC: Recent Labs    09/03/19 1136 09/04/19 0539 09/06/19 0758 09/08/19 0854 09/10/19 0925  WBC 11.9*   < > 6.5 6.1 4.6  NEUTROABS 10.3*  --   --  3.5 2.2  HGB 13.6   < > 11.5* 10.6* 11.2*  HCT 43.6   < > 37.4 33.7* 35.8*  MCV 83.8   < > 86.6 86.4 85.9  PLT 430*   < > 408* 389 PLATELET CLUMPS NOTED ON SMEAR, COUNT APPEARS ADEQUATE   < > = values in this interval not displayed.   Lipid No results for input(s): CHOL, HDL, LDLCALC, TRIG in the last 8760 hours.  Cardiac Enzymes: No results for input(s): CKTOTAL, CKMB, CKMBINDEX, TROPONINI in the last 8760 hours. BNP: No results for input(s): BNP in the last 8760 hours. No results found for: [facility] Lab Results  Component Value Date   HGBA1C 6.0 11/08/2016   Lab Results  Component Value Date   TSH 1.647 12/13/2016   Lab Results  Component Value Date   [id number] 992 12/13/2016   No results found for: FOLATE Lab Results  Component Value Date   IRON 14 (L) 04/03/2016   TIBC 227 (L) 04/03/2016   FERRITIN 129 04/03/2016    Imaging and Procedures obtained prior to SNF admission: No results found.   Not all labs, radiology exams or other studies done during hospitalization come through on my EPIC note; however they are reviewed by me.    Assessment and Plan  Sepsis/pyelonephritis/ESBL UTI-treated with ertapenem through 2/18 to complete course SNF-continue ertapenem through 2/18 to complete course  Mild renal insufficiency/hypovolemic hyponatremia-felt to be prerenal, resolved with IV fluids  SNF-follow-up BMP  Hypertension SNF-we will follow; diet controlled  Overactive bladder SNF-chronic and stable continue Ditropan XL 10 mg daily  Hypokalemia SNF-continue potassium 20 mEq daily; follow-up BMP  Iron deficiency anemia SNF-continue150 mg daily   Time spent  greater than 45 minutes;> 50% of time with patient was spent reviewing records, labs, tests and studies, counseling and developing plan of care . Margit Hanks, MD

## 2019-09-13 ENCOUNTER — Encounter: Payer: Self-pay | Admitting: Internal Medicine

## 2019-09-13 DIAGNOSIS — N3281 Overactive bladder: Secondary | ICD-10-CM | POA: Insufficient documentation

## 2019-09-15 ENCOUNTER — Encounter: Payer: Self-pay | Admitting: Internal Medicine

## 2019-09-15 ENCOUNTER — Non-Acute Institutional Stay (SKILLED_NURSING_FACILITY): Payer: Medicare HMO | Admitting: Internal Medicine

## 2019-09-15 DIAGNOSIS — N10 Acute tubulo-interstitial nephritis: Secondary | ICD-10-CM

## 2019-09-15 DIAGNOSIS — M1711 Unilateral primary osteoarthritis, right knee: Secondary | ICD-10-CM

## 2019-09-15 DIAGNOSIS — D649 Anemia, unspecified: Secondary | ICD-10-CM

## 2019-09-15 DIAGNOSIS — E876 Hypokalemia: Secondary | ICD-10-CM

## 2019-09-15 DIAGNOSIS — I10 Essential (primary) hypertension: Secondary | ICD-10-CM | POA: Diagnosis not present

## 2019-09-15 NOTE — Progress Notes (Signed)
Location:  [facility] Room Number: 151P Place of Service:  SNF (31) Provider:  Roena Malady, PA-C   [name], MD  Patient Care Team: [name], MD as PCP - General [facility] Medicine)  Extended Emergency Contact Information Primary Emergency Contact: [name], [place] [address] [place] of [place] Home Phone: [phone] Relation: Relative Secondary Emergency Contact: [name], [place] [address] [place] of [place] Home Phone: [phone] Relation: Relative  Code Status:  FULL Goals of care: Advanced Directive information Advanced Directives 09/15/2019  Does Patient Have a Medical Advance Directive? Yes  Type of Advance Directive (No Data)  Does patient want to make changes to medical advance directive? No - Patient declined  Copy of Healthcare Power of Attorney in Chart? -  Would patient like information on creating a medical advance directive? -  Pre-existing out of facility DNR order (yellow form or pink MOST form) -    Chief complaint medical management of chronic medical conditions-short-term rehab patient-with weekly follow-up   HPI:  Pt is a 85 y.o. female seen today for medical management of chronic diseases-she is a short-term rehab patient here for rehab after hospitalization for sepsis positive for ESBL urine.  She completed a course of Invanz and appears to be stable in this regards.  She does have a history of paraplegia and significant osteoarthritis especially of her right knee-she does receive Voltaren gel now 3 times daily which apparently is helping some she also has Tylenol 3 times a day.  In regards to hypertension this is stable she is on no medications blood pressures range from 114 to the 140s I do not see consistent elevations above 140.  She also has a history of urinary urgency and continues on Ditropan she is does not complain of that today.  In regards to anemia she  is on iron hemoglobin is 11.2 which appears to be relatively stable.  She also is on low-dose potassium with a history of hypokalemia potassium was stable at 4.3 on lab done on 17 February.  Currently she is lying in bed comfortably she complains of some intermittent knee pain-her main complaint is she would like to go home.     Past Medical History:  Diagnosis Date  . Anemia   . Diabetes mellitus without complication (HCC)   . Glaucoma   . Hypercholesteremia   . Hypertension   . Hypokalemia   . Paraplegia (HCC)   . Status post total right knee replacement    Past Surgical History:  Procedure Laterality Date  . ABDOMINAL HYSTERECTOMY    . CATARACT EXTRACTION    . COLONOSCOPY N/A 12/10/2013   Procedure: COLONOSCOPY;  Surgeon: West Bali, MD;  Location: [facility];  Service: Endoscopy;  Laterality: N/A;  8:30 AM  . TOTAL KNEE ARTHROPLASTY Right 03/30/2016   Procedure: TOTAL KNEE ARTHROPLASTY;  Surgeon: Vickki Hearing, MD;  Location: [facility] ORS;  Service: Orthopedics;  Laterality: Right;    No Known Allergies  Outpatient Encounter Medications as of 09/15/2019  Medication Sig  . acetaminophen (TYLENOL) 325 MG tablet Take 650 mg by mouth 3 (three) times daily.   . Amino Acids-Protein Hydrolys (FEEDING SUPPLEMENT, PRO-STAT SUGAR FREE 64,) LIQD Take 30 mLs by mouth 2 (two) times daily.  . calcium citrate (CALCITRATE - DOSED IN MG ELEMENTAL CALCIUM) 950 MG tablet Take 200 mg of elemental calcium by mouth  daily.  . diclofenac Sodium (VOLTAREN) 1 % GEL Apply 4 g topically 3 (three) times daily.   . iron polysaccharides (FERREX 150) 150 MG capsule Take 1 capsule by mouth daily.  Marland Kitchen latanoprost (XALATAN) 0.005 % ophthalmic solution Place 1 drop into both eyes at bedtime. wait 5 minutes between multiple eye drops  . naproxen (NAPROSYN) 500 MG tablet Take 1 tablet by mouth every 12 (twelve) hours as needed. for arthritis at the knee  . NON FORMULARY Diet: NAS,  . Nutritional Supplements  (ENSURE ENLIVE PO) Take 237 mLs by mouth daily. due to poor meal intake  . oxybutynin (DITROPAN-XL) 10 MG 24 hr tablet Take 10 mg by mouth daily.  . Potassium Chloride ER 20 MEQ TBCR Take 20 mEq by mouth daily at 6 (six) AM.  . vitamin C (ASCORBIC ACID) 250 MG tablet Take 500 mg by mouth daily.  Marland Kitchen zinc oxide 20 % ointment Apply 1 application topically in the morning, at noon, and at bedtime. Apply to sacrum and bilateral buttocks every shift   No facility-administered encounter medications on file as of 09/15/2019.    Review of Systems   In general she is not complaining of any fever or chills.  Skin does not complain of rashes itching or diaphoresis.  Head ears eyes nose mouth and throat does not complain of visual changes or sore throat.  Respiratory not complain of being short of breath or having a cough.  Cardiac does not complain of chest pain or edema.  GI does not complain of abdominal discomfort nausea vomiting diarrhea constipation.  GU is not complaining of dysuria.  Musculoskeletal does have a history of osteoarthritis especially of her right knee at times will complain of some discomfort with this.  Neurologic does not complain of dizziness headache she does have a history of paraplegia.  And psych is not complaining of being depressed or anxious she does express desires to go home however  Immunization History  Administered Date(s) Administered  . PPD Test 10/03/2016   Pertinent  Health Maintenance Due  Topic Date Due  . DEXA SCAN  09/18/2001  . PNA vac Low Risk Adult (1 of 2 - PCV13) 09/18/2001  . INFLUENZA VACCINE  02/22/2019   No flowsheet data found. Functional Status Survey:    Vitals:   09/15/19 1121  BP: (!) 142/83  Pulse: 66  Resp: 20  Temp: 97.7 F (36.5 C)  TempSrc: Oral  SpO2: 95%  Weight: 194 lb 12.8 oz (88.4 kg)  Height: 5\' 5"  (1.651 m)  Systolic blood pressures range from 114 to mid 140s Body mass index is 32.42 kg/m. Physical Exam  general this is a pleasant elderly female in no distress lying comfortably in bed.  Her skin is warm and dry she does have a well-healed surgical scar right knee.  Eyes visual acuity appears to be intact sclera are clear.  Oropharynx is clear mucous membranes moist.  Chest is clear to auscultation there is no labored breathing.  Heart is regular rate and rhythm without murmur gallop or rub she does not have significant lower extremity edema-she does have some edema chronic changes to her knees bilaterally.  Abdomen is soft nontender with positive bowel sounds.  Musculoskeletal does hold her right lower extremity in a semicontracted position with knee changes as noted above.  Moves other extremities appears at relative baseline limited exam since she is in bed.  Neurologic as noted above her speech is clear cranial nerves appear to be intact.  Psych she is pleasant and appropriate.    Labs reviewed: Recent Labs    09/05/19 0517 09/05/19 0517 09/06/19 0758 09/08/19 0854 09/10/19 0925  NA 129*   < > 135 134* 134*  K 3.8   < > 3.7 4.2 4.3  CL 97*   < > 99 100 99  CO2 21*   < > 26 27 27   GLUCOSE 100*   < > 113* 100* 105*  BUN 13   < > 11 13 6*  CREATININE 0.55   < > 0.46 0.54 0.54  CALCIUM 8.2*   < > 8.4* 8.3* 8.6*  MG 2.2  --  2.0  --   --    < > = values in this interval not displayed.   Recent Labs    09/03/19 1136  AST 20  ALT 16  ALKPHOS 66  BILITOT 0.9  PROT 7.5  ALBUMIN 3.9   Recent Labs    09/03/19 1136 09/04/19 0539 09/06/19 0758 09/08/19 0854 09/10/19 0925  WBC 11.9*   < > 6.5 6.1 4.6  NEUTROABS 10.3*  --   --  3.5 2.2  HGB 13.6   < > 11.5* 10.6* 11.2*  HCT 43.6   < > 37.4 33.7* 35.8*  MCV 83.8   < > 86.6 86.4 85.9  PLT 430*   < > 408* 389 PLATELET CLUMPS NOTED ON SMEAR, COUNT APPEARS ADEQUATE   < > = values in this interval not displayed.   Lab Results  Component Value Date   TSH 1.647 12/13/2016   Lab Results  Component Value Date    HGBA1C 6.0 11/08/2016   Lab Results  Component Value Date   CHOL 184 11/08/2016   HDL 59 11/08/2016   LDLCALC 90 11/08/2016   TRIG 175 (A) 11/08/2016    Significant Diagnostic Results in last 30 days:  DG Ankle 2 Views Right  Result Date: 09/04/2019 CLINICAL DATA:  Pt c/o pain in Rt ankle. Pt seems confused when asked about Rt ankle - Pt has wound dressing to posterior aspect of Rt heel. Unsure of any injury. HX diabetes EXAM: RIGHT ANKLE - 2 VIEW COMPARISON:  None. FINDINGS: No fracture or bone lesion. No bone resorption is seen to suggest osteomyelitis. Ankle joint is normally spaced and aligned. No arthropathic changes. Minimal plantar and small dorsal calcaneal spurs. Skeletal structures are demineralized. No soft tissue air. IMPRESSION: 1. No fracture, bone lesion or ankle joint abnormality. 2. No evidence of osteomyelitis. Electronically Signed   By: 11/02/2019 M.D.   On: 09/04/2019 13:50   CT ABDOMEN PELVIS W CONTRAST  Result Date: 09/03/2019 CLINICAL DATA:  Nausea and vomiting for 2 days. Question sepsis from cholecystitis. Technologist notes state diagnosed with urinary tract infection and started on antibiotics 3 days ago. EXAM: CT ABDOMEN AND PELVIS WITH CONTRAST TECHNIQUE: Multidetector CT imaging of the abdomen and pelvis was performed using the standard protocol following bolus administration of intravenous contrast. CONTRAST:  11/01/2019 OMNIPAQUE IOHEXOL 300 MG/ML  SOLN COMPARISON:  None. FINDINGS: Lower chest: Mild subpleural reticulation and interstitial coarsening. Mild pleural thickening without significant effusion. No confluent airspace disease. There is wall thickening of the distal esophagus. Peripherally calcified 2.2 cm lesion in the left breast/low left axilla is partially included. Hepatobiliary: Possible tiny hypodensity adjacent to the gallbladder fossa, series 2, image 23. Gallbladder is elongated. No evidence of gallbladder wall thickening, calcified gallstone, or  pericholecystic inflammation. Upper normal common bile duct at 7 mm. Pancreas: Parenchymal atrophy. No  ductal dilatation or inflammation. Spleen: Normal in size without focal abnormality. Adrenals/Urinary Tract: No adrenal nodule. No hydronephrosis. Nephrograms in the both kidneys are slightly heterogeneous. No focal renal lesion or fluid collection.1 urinary bladder is physiologically distended. There is no bladder wall thickening. No perivesicular stranding. Stomach/Bowel: Wall thickening of the distal esophagus. Fluid-filled stomach without gastric wall thickening. Normal positioning of the ligament of Treitz. Detailed bowel assessment is limited in the absence of enteric contrast. Diminutive normal appendix, series 7, image 49. No appendicitis. Moderate stool burden throughout the colon. Mild gaseous distension of transverse colon. There is no colonic wall thickening or inflammation. Vascular/Lymphatic: Aorto bi-iliac atherosclerosis. No aortic aneurysm. Portal vein is patent. No enlarged abdominopelvic lymph nodes. Reproductive: Uterus surgically absent. No adnexal mass. Other: No free air or ascites. No evidence of intra-abdominal fluid collection. Musculoskeletal: Degenerative change in the lower lumbar spine. Degenerative change of both hips. Trabecular coarsening of the left inferior pubic ramus and ischium. IMPRESSION: 1. Wall thickening of the distal esophagus, suspicious for esophagitis or reflux. 2. Slight heterogeneous nephrograms of both kidneys, may represent pyelonephritis in the setting of recent urinary tract infection. No focal fluid collection. 3. No CT findings of acute cholecystitis. 4. Trabecular coarsening of the left inferior pubic ramus and ischium, may be sequela of prior trauma or Paget's disease. Aortic Atherosclerosis (ICD10-I70.0). Electronically Signed   By: Keith Rake M.D.   On: 09/03/2019 15:14   DG Chest Port 1 View  Result Date: 09/03/2019 CLINICAL DATA:  Sepsis. EXAM:  PORTABLE CHEST 1 VIEW COMPARISON:  03/26/2018 FINDINGS: The heart size and pulmonary vascularity are normal. No infiltrates or effusions. Chronic peribronchial thickening. No acute bone abnormality. IMPRESSION: No acute abnormality. Electronically Signed   By: Lorriane Shire M.D.   On: 09/03/2019 12:02   [place] EKG SITE RITE  Result Date: 09/05/2019 If Site Rite image not attached, placement could not be confirmed due to current cardiac rhythm.   Assessment/Plan  #1 history of sepsis with ESBL UTI-she has completed Invanz she does not complain of dysuria at this point appears to be stable without complaint.  2.  History of urinary urgency she continues on Ditropan XL 10 mg a day this appears to be stable as well.  3.  History of right knee arthritis she does have orders for Tylenol 650 mg 3 times daily Voltaren gel has been added secondary to some complaints of right knee discomfort at this point will monitor hopefully will be effective.  Of note she also continues on Naprosyn 500 mg every 12 hours as needed  4.  History of hypertension she does have some variable systolics as noted above at this point will monitor currently not on any medication.  5.-History of anemia she continues on iron hemoglobin appears relatively stable at 11.20 most recent lab.  6.  History of hypokalemia she is on potassium supplementation 20 mEq a day.  Potassium was normal on recent lab on February 17 at 4.3.  7.  History of hyponatremia this is stable her sodium was 134 on lab done on 17 February will monitor periodically.  [id number]

## 2019-09-16 ENCOUNTER — Encounter: Payer: Self-pay | Admitting: Internal Medicine

## 2019-09-17 ENCOUNTER — Other Ambulatory Visit (HOSPITAL_COMMUNITY)
Admission: RE | Admit: 2019-09-17 | Discharge: 2019-09-17 | Disposition: A | Discharge status: Home / Self Care | Payer: Medicare HMO | Source: Skilled Nursing Facility | Attending: Internal Medicine | Admitting: Internal Medicine

## 2019-09-17 DIAGNOSIS — N10 Acute tubulo-interstitial nephritis: Secondary | ICD-10-CM | POA: Insufficient documentation

## 2019-09-17 LAB — BASIC METABOLIC PANEL
Anion gap: 9 (ref 5–15)
BUN: 14 mg/dL (ref 8–23)
CO2: 26 mmol/L (ref 22–32)
Calcium: 8.7 mg/dL — ABNORMAL LOW (ref 8.9–10.3)
Chloride: 98 mmol/L (ref 98–111)
Creatinine, Ser: 0.53 mg/dL (ref 0.44–1.00)
GFR calc Af Amer: >60 mL/min (ref >60)
GFR calc non Af Amer: >60 mL/min (ref >60)
Glucose, Bld: 163 mg/dL — ABNORMAL HIGH (ref 70–99)
Potassium: 4.2 mmol/L (ref 3.5–5.1)
Sodium: 133 mmol/L — ABNORMAL LOW (ref 135–145)

## 2019-09-17 LAB — CBC WITH DIFFERENTIAL/PLATELET
Abs Immature Granulocytes: 0.01 10*3/uL (ref 0.00–0.07)
Basophils Absolute: 0 10*3/uL (ref 0.0–0.1)
Basophils Relative: 0 %
Eosinophils Absolute: 0 10*3/uL (ref 0.0–0.5)
Eosinophils Relative: 0 %
HCT: 38.7 % (ref 36.0–46.0)
Hemoglobin: 12 g/dL (ref 12.0–15.0)
Immature Granulocytes: 0 %
Lymphocytes Relative: 9 %
Lymphs Abs: 0.6 10*3/uL — ABNORMAL LOW (ref 0.7–4.0)
MCH: 26.7 pg (ref 26.0–34.0)
MCHC: 31 g/dL (ref 30.0–36.0)
MCV: 86.2 fL (ref 80.0–100.0)
Monocytes Absolute: 0.3 10*3/uL (ref 0.1–1.0)
Monocytes Relative: 5 %
Neutro Abs: 5.5 10*3/uL (ref 1.7–7.7)
Neutrophils Relative %: 86 %
Platelets: 400 10*3/uL (ref 150–400)
RBC: 4.49 MIL/uL (ref 3.87–5.11)
RDW: 15 % (ref 11.5–15.5)
WBC: 6.5 10*3/uL (ref 4.0–10.5)
nRBC: 0 % (ref 0.0–0.2)

## 2019-09-22 ENCOUNTER — Non-Acute Institutional Stay (SKILLED_NURSING_FACILITY): Payer: Medicare HMO | Admitting: Adult Health

## 2019-09-22 ENCOUNTER — Encounter: Payer: Self-pay | Admitting: Adult Health

## 2019-09-22 ENCOUNTER — Other Ambulatory Visit (HOSPITAL_COMMUNITY)
Admission: AD | Admit: 2019-09-22 | Discharge: 2019-09-22 | Disposition: A | Discharge status: Home / Self Care | Payer: Medicare HMO | Source: Skilled Nursing Facility | Attending: Adult Health | Admitting: Adult Health

## 2019-09-22 DIAGNOSIS — D649 Anemia, unspecified: Secondary | ICD-10-CM

## 2019-09-22 DIAGNOSIS — M1711 Unilateral primary osteoarthritis, right knee: Secondary | ICD-10-CM | POA: Diagnosis not present

## 2019-09-22 DIAGNOSIS — I10 Essential (primary) hypertension: Secondary | ICD-10-CM

## 2019-09-22 DIAGNOSIS — K219 Gastro-esophageal reflux disease without esophagitis: Secondary | ICD-10-CM | POA: Diagnosis not present

## 2019-09-22 LAB — CBC
HCT: 42.7 % (ref 36.0–46.0)
Hemoglobin: 12.9 g/dL (ref 12.0–15.0)
MCH: 26.9 pg (ref 26.0–34.0)
MCHC: 30.2 g/dL (ref 30.0–36.0)
MCV: 89.1 fL (ref 80.0–100.0)
Platelets: 428 10*3/uL — ABNORMAL HIGH (ref 150–400)
RBC: 4.79 MIL/uL (ref 3.87–5.11)
RDW: 15.1 % (ref 11.5–15.5)
WBC: 6.9 10*3/uL (ref 4.0–10.5)
nRBC: 0 % (ref 0.0–0.2)

## 2019-09-22 LAB — BASIC METABOLIC PANEL
Anion gap: 11 (ref 5–15)
BUN: 17 mg/dL (ref 8–23)
CO2: 20 mmol/L — ABNORMAL LOW (ref 22–32)
Calcium: 8.6 mg/dL — ABNORMAL LOW (ref 8.9–10.3)
Chloride: 101 mmol/L (ref 98–111)
Creatinine, Ser: 0.67 mg/dL (ref 0.44–1.00)
GFR calc Af Amer: >60 mL/min (ref >60)
GFR calc non Af Amer: >60 mL/min (ref >60)
Glucose, Bld: 109 mg/dL — ABNORMAL HIGH (ref 70–99)
Potassium: 4.7 mmol/L (ref 3.5–5.1)
Sodium: 132 mmol/L — ABNORMAL LOW (ref 135–145)

## 2019-09-22 LAB — TROPONIN I (HIGH SENSITIVITY): Troponin I (High Sensitivity): 6 ng/L (ref <18)

## 2019-09-22 NOTE — Progress Notes (Signed)
Location:    [facility] Room Number: 103/P Place of Service:  SNF (31)   CODE STATUS: Full Code  No Known Allergies  Chief Complaint  Patient presents with  . Medical Management of Chronic Issues        Primary osteoarthritis right knee    Essential benign HTN:  Chronic anemia:   Weekly follow up for the first 30 days post hospitalization.     HPI:  She is a 85 year old short term rehab patient being seen for the management of her chronic illnesses: osteoarthritis; hypertension; anemia. She has had epigastric pain and heart burn. She was started on protonix for 2 days. There are no reports of chest pain at this time. There are no reports of changes in appetite. No reports of anxiety or agitation.   Past Medical History:  Diagnosis Date  . Anemia   . Diabetes mellitus without complication (HCC)   . Glaucoma   . Hypercholesteremia   . Hypertension   . Hypokalemia   . Paraplegia (HCC)   . Status post total right knee replacement     Past Surgical History:  Procedure Laterality Date  . ABDOMINAL HYSTERECTOMY    . CATARACT EXTRACTION    . COLONOSCOPY N/A 12/10/2013   Procedure: COLONOSCOPY;  Surgeon: West Bali, MD;  Location: [facility];  Service: Endoscopy;  Laterality: N/A;  8:30 AM  . TOTAL KNEE ARTHROPLASTY Right 03/30/2016   Procedure: TOTAL KNEE ARTHROPLASTY;  Surgeon: Vickki Hearing, MD;  Location: [facility];  Service: Orthopedics;  Laterality: Right;    Social History   Socioeconomic History  . Marital status: Divorced    Spouse name: Not on file  . Number of children: Not on file  . Years of education: Not on file  . Highest education level: Not on file  Occupational History  . Not on file  Tobacco Use  . Smoking status: Current Some Day Smoker    Packs/day: 0.25    Years: 3.00    Pack years: 0.75    Types: Cigarettes  . Smokeless tobacco: Never Used  Substance and Sexual Activity  . Alcohol use: No  . Drug use: No  .  Sexual activity: Not Currently    Birth control/protection: Post-menopausal  Other Topics Concern  . Not on file  Social History Narrative  . Not on file   Social Determinants of Health   Financial Resource Strain:   . Difficulty of Paying Living Expenses: Not on file  Food Insecurity:   . Worried About Programme researcher, broadcasting/film/video in the Last Year: Not on file  . Ran Out of Food in the Last Year: Not on file  Transportation Needs:   . Lack of Transportation (Medical): Not on file  . Lack of Transportation (Non-Medical): Not on file  Physical Activity:   . Days of Exercise per Week: Not on file  . Minutes of Exercise per Session: Not on file  Stress:   . Feeling of Stress : Not on file  Social Connections:   . Frequency of Communication with Friends and Family: Not on file  . Frequency of Social Gatherings with Friends and Family: Not on file  . Attends Religious Services: Not on file  . Active Member of Clubs or Organizations: Not on file  . Attends Banker Meetings: Not on file  . Marital Status: Not on file  Intimate Partner Violence:   . Fear of Current or Ex-Partner: Not  on file  . Emotionally Abused: Not on file  . Physically Abused: Not on file  . Sexually Abused: Not on file   Family History  Problem Relation Age of Onset  . Cancer Mother   . Arthritis Mother   . Hypotension Father   . Cancer Sister   . Hypotension Sister   . Colon cancer Neg Hx       VITAL SIGNS BP 108/64   Pulse 88   Temp 98.4 F (36.9 C) (Oral)   Resp 20   Ht 5\' 5"  (1.651 m)   Wt 194 lb 12.8 oz (88.4 kg)   SpO2 95%   BMI 32.42 kg/m   Outpatient Encounter Medications as of 09/22/2019  Medication Sig  . acetaminophen (TYLENOL) 325 MG tablet Take 650 mg by mouth 3 (three) times daily.   . Amino Acids-Protein Hydrolys (FEEDING SUPPLEMENT, PRO-STAT SUGAR FREE 64,) LIQD Take 30 mLs by mouth 2 (two) times daily.  . calcium citrate (CALCITRATE - DOSED IN MG ELEMENTAL CALCIUM) 950 MG  tablet Take 200 mg of elemental calcium by mouth daily.  . diclofenac Sodium (VOLTAREN) 1 % GEL Apply 4 g topically 3 (three) times daily.   . iron polysaccharides (FERREX 150) 150 MG capsule Take 1 capsule by mouth daily.  11/22/2019 latanoprost (XALATAN) 0.005 % ophthalmic solution Place 1 drop into both eyes at bedtime. wait 5 minutes between multiple eye drops  . naproxen (NAPROSYN) 500 MG tablet Take 1 tablet by mouth every 12 (twelve) hours as needed. for arthritis at the knee  . NON FORMULARY Diet: NAS,  . Nutritional Supplements (ENSURE ENLIVE PO) Take 237 mLs by mouth daily. due to poor meal intake  . oxybutynin (DITROPAN-XL) 10 MG 24 hr tablet Take 10 mg by mouth daily.  . pantoprazole (PROTONIX) 40 MG tablet Take 40 mg by mouth daily.  . Potassium Chloride ER 20 MEQ TBCR Take 20 mEq by mouth daily at 6 (six) AM.  . sodium chloride 0.9 % injection Flush PICC with 5cc normal saline before and after IV ABO administration and then follow with 3cc Heparin u-100. Once A Day 08:00 AM  . vitamin C (ASCORBIC ACID) 250 MG tablet Take 500 mg by mouth daily.  Marland Kitchen zinc oxide 20 % ointment Apply 1 application topically in the morning, at noon, and at bedtime. Apply to sacrum and bilateral buttocks every shift   No facility-administered encounter medications on file as of 09/22/2019.     SIGNIFICANT DIAGNOSTIC EXAMS  TODAY  09-03-19: chest x-ray: No acute abnormality.   09-03-19: ct of abdomen and pelvis:  1. Wall thickening of the distal esophagus, suspicious foresophagitis or reflux. 2. Slight heterogeneous nephrograms of both kidneys, may representpyelonephritis in the setting of recent urinary tract infection. No focal fluid collection. 3. No CT findings of acute cholecystitis. 4. Trabecular coarsening of the left inferior pubic ramus and ischium, may be sequela of prior trauma or Paget's disease. Aortic Atherosclerosis   09-04-19: right ankle x-ray: 1. No fracture, bone lesion or ankle joint  abnormality.2. No evidence of osteomyelitis.  NO NEW EXAMS.   LABS REVIEWED PREVIOUS;   09-03-19: wbc 11.9; hgb 13.6; hct 43.6; mcv 83.8 plt 430; glucose 161; bun 28; creat 0.95 ;k+ 4.1; na++ 127; ca 9.1 liver normal albumin 3.9 urine culture: e-coli ESBL and 50,000 proteus mirabilis 09-08-19: wbc 6.1 hgb 10.6; hct 33.7; mcv 86.4 plt 389; glucose 100; bun 13; creat 0.54; k+ 4.2; na++ 134; ca 8.3  NO NEW LABS.  Review of Systems  Unable to perform ROS: Dementia (unable to participate )    Physical Exam Constitutional:      General: She is not in acute distress.    Appearance: She is not diaphoretic.  Eyes:     Conjunctiva/sclera: Conjunctivae normal.     Comments: Cataract surgery    Neck:     Thyroid: No thyromegaly.     Vascular: No JVD.  Cardiovascular:     Rate and Rhythm: Normal rate and regular rhythm.  Pulmonary:     Effort: Pulmonary effort is normal. No respiratory distress.     Breath sounds: Normal breath sounds. No wheezing.  Abdominal:     General: Bowel sounds are normal. There is no distension.     Palpations: Abdomen is soft.     Tenderness: There is no abdominal tenderness.  Musculoskeletal:     Cervical back: Neck supple.     Right lower leg: No edema.     Left lower leg: No edema.     Comments:  Is able to move all extremities  History of right knee replacement     Lymphadenopathy:     Cervical: No cervical adenopathy.  Skin:    General: Skin is warm and dry.  Neurological:     Mental Status: She is alert. Mental status is at baseline.  Psychiatric:        Mood and Affect: Mood normal.      ASSESSMENT/ PLAN:  TODAY;   1. Primary osteoarthritis right knee is stable will continue tylenol 650 mg three times daily and voltaren gel 4 gm to right knee three times daily   2. Essential benign HTN: is stable b/p 108/64 will monitor will begin asa 81 mg daily   3. Chronic anemia: is stable hgb 10.6; will continue ferrex daily   4. GERD without  esophagitis: is stable will continue protonix 40 mg daily   PREVIOUS  5. Urinary urgency: is stable will continue ditropan xl 10 mg daily   6. Hypokalemia: is stable k+ 4.2 will continue k+ 20 meq daily   7. Increased ocular pressure bilateral: is stable will continue xalatan to both eyes.      MD is aware of resident's narcotic use and is in agreement with current plan of care. We will attempt to wean resident as appropriate.  Ok Edwards NP [facility]  Contact [phone] Monday through Friday 8am- 5pm  After hours call [phone]

## 2019-09-24 ENCOUNTER — Encounter: Payer: Self-pay | Admitting: Adult Health

## 2019-09-24 ENCOUNTER — Non-Acute Institutional Stay (SKILLED_NURSING_FACILITY): Payer: Medicare HMO | Admitting: Adult Health

## 2019-09-24 ENCOUNTER — Other Ambulatory Visit (HOSPITAL_COMMUNITY)
Admission: RE | Admit: 2019-09-24 | Discharge: 2019-09-24 | Disposition: A | Discharge status: Home / Self Care | Payer: Medicare HMO | Source: Skilled Nursing Facility | Attending: Internal Medicine | Admitting: Internal Medicine

## 2019-09-24 DIAGNOSIS — F0390 Unspecified dementia without behavioral disturbance: Secondary | ICD-10-CM | POA: Diagnosis not present

## 2019-09-24 DIAGNOSIS — D649 Anemia, unspecified: Secondary | ICD-10-CM

## 2019-09-24 DIAGNOSIS — I10 Essential (primary) hypertension: Secondary | ICD-10-CM | POA: Diagnosis not present

## 2019-09-24 DIAGNOSIS — N10 Acute tubulo-interstitial nephritis: Secondary | ICD-10-CM | POA: Diagnosis present

## 2019-09-24 LAB — CBC WITH DIFFERENTIAL/PLATELET
Abs Immature Granulocytes: 0.02 10*3/uL (ref 0.00–0.07)
Basophils Absolute: 0.1 10*3/uL (ref 0.0–0.1)
Basophils Relative: 1 %
Eosinophils Absolute: 0.3 10*3/uL (ref 0.0–0.5)
Eosinophils Relative: 6 %
HCT: 37.2 % (ref 36.0–46.0)
Hemoglobin: 11.4 g/dL — ABNORMAL LOW (ref 12.0–15.0)
Immature Granulocytes: 0 %
Lymphocytes Relative: 35 %
Lymphs Abs: 1.7 10*3/uL (ref 0.7–4.0)
MCH: 27 pg (ref 26.0–34.0)
MCHC: 30.6 g/dL (ref 30.0–36.0)
MCV: 88.2 fL (ref 80.0–100.0)
Monocytes Absolute: 0.6 10*3/uL (ref 0.1–1.0)
Monocytes Relative: 13 %
Neutro Abs: 2.3 10*3/uL (ref 1.7–7.7)
Neutrophils Relative %: 45 %
Platelets: 441 10*3/uL — ABNORMAL HIGH (ref 150–400)
RBC: 4.22 MIL/uL (ref 3.87–5.11)
RDW: 15.2 % (ref 11.5–15.5)
WBC: 5 10*3/uL (ref 4.0–10.5)
nRBC: 0 % (ref 0.0–0.2)

## 2019-09-24 LAB — C-REACTIVE PROTEIN: CRP: 1.1 mg/dL — ABNORMAL HIGH (ref <1.0)

## 2019-09-24 LAB — BASIC METABOLIC PANEL
Anion gap: 9 (ref 5–15)
BUN: 19 mg/dL (ref 8–23)
CO2: 27 mmol/L (ref 22–32)
Calcium: 8.7 mg/dL — ABNORMAL LOW (ref 8.9–10.3)
Chloride: 100 mmol/L (ref 98–111)
Creatinine, Ser: 0.59 mg/dL (ref 0.44–1.00)
GFR calc Af Amer: >60 mL/min (ref >60)
GFR calc non Af Amer: >60 mL/min (ref >60)
Glucose, Bld: 137 mg/dL — ABNORMAL HIGH (ref 70–99)
Potassium: 4.3 mmol/L (ref 3.5–5.1)
Sodium: 136 mmol/L (ref 135–145)

## 2019-09-24 LAB — SEDIMENTATION RATE: Sed Rate: 19 mm/hr (ref 0–22)

## 2019-09-24 NOTE — Progress Notes (Signed)
Location:    [facility] Room Number: 103/P Place of Service:  SNF (31)   CODE STATUS: Full Code  No Known Allergies  Chief Complaint  Patient presents with  . Acute Visit    care plan meeting      HPI:  We have come together for her care plan meeting. BIMS 10/15 mood 6/30. She continues to participate in pt/ot. No falls her weight is stable her appetite is fair. There are no reports of uncontrolled pain. She continues to require extensive assist with adls. Her goal is to return back to her assisted living. She continues to be followed for her chronic illnesses including: dementia; hypertension anemia.   Past Medical History:  Diagnosis Date  . Anemia   . Diabetes mellitus without complication (HCC)   . Glaucoma   . Hypercholesteremia   . Hypertension   . Hypokalemia   . Paraplegia (HCC)   . Status post total right knee replacement     Past Surgical History:  Procedure Laterality Date  . ABDOMINAL HYSTERECTOMY    . CATARACT EXTRACTION    . COLONOSCOPY N/A 12/10/2013   Procedure: COLONOSCOPY;  Surgeon: West Bali, MD;  Location: [facility];  Service: Endoscopy;  Laterality: N/A;  8:30 AM  . TOTAL KNEE ARTHROPLASTY Right 03/30/2016   Procedure: TOTAL KNEE ARTHROPLASTY;  Surgeon: Vickki Hearing, MD;  Location: [facility];  Service: Orthopedics;  Laterality: Right;    Social History   Socioeconomic History  . Marital status: Divorced    Spouse name: Not on file  . Number of children: Not on file  . Years of education: Not on file  . Highest education level: Not on file  Occupational History  . Not on file  Tobacco Use  . Smoking status: Current Some Day Smoker    Packs/day: 0.25    Years: 3.00    Pack years: 0.75    Types: Cigarettes  . Smokeless tobacco: Never Used  Substance and Sexual Activity  . Alcohol use: No  . Drug use: No  . Sexual activity: Not Currently    Birth control/protection: Post-menopausal  Other Topics Concern    . Not on file  Social History Narrative  . Not on file   Social Determinants of Health   Financial Resource Strain:   . Difficulty of Paying Living Expenses: Not on file  Food Insecurity:   . Worried About Programme researcher, broadcasting/film/video in the Last Year: Not on file  . Ran Out of Food in the Last Year: Not on file  Transportation Needs:   . Lack of Transportation (Medical): Not on file  . Lack of Transportation (Non-Medical): Not on file  Physical Activity:   . Days of Exercise per Week: Not on file  . Minutes of Exercise per Session: Not on file  Stress:   . Feeling of Stress : Not on file  Social Connections:   . Frequency of Communication with Friends and Family: Not on file  . Frequency of Social Gatherings with Friends and Family: Not on file  . Attends Religious Services: Not on file  . Active Member of Clubs or Organizations: Not on file  . Attends Banker Meetings: Not on file  . Marital Status: Not on file  Intimate Partner Violence:   . Fear of Current or Ex-Partner: Not on file  . Emotionally Abused: Not on file  . Physically Abused: Not on file  . Sexually Abused: Not on  file   Family History  Problem Relation Age of Onset  . Cancer Mother   . Arthritis Mother   . Hypotension Father   . Cancer Sister   . Hypotension Sister   . Colon cancer Neg Hx       VITAL SIGNS BP 98/65   Pulse 76   Temp 97.9 F (36.6 C) (Oral)   Resp 16   Ht 5\' 5"  (1.651 m)   Wt 177 lb (80.3 kg)   SpO2 95%   BMI 29.45 kg/m   Outpatient Encounter Medications as of 09/24/2019  Medication Sig  . acetaminophen (TYLENOL) 325 MG tablet Take 650 mg by mouth 3 (three) times daily.   . Amino Acids-Protein Hydrolys (FEEDING SUPPLEMENT, PRO-STAT SUGAR FREE 64,) LIQD Take 30 mLs by mouth 2 (two) times daily.  . calcium citrate (CALCITRATE - DOSED IN MG ELEMENTAL CALCIUM) 950 MG tablet Take 200 mg of elemental calcium by mouth daily.  . diclofenac Sodium (VOLTAREN) 1 % GEL Apply 4 g  topically 3 (three) times daily.   . iron polysaccharides (FERREX 150) 150 MG capsule Take 1 capsule by mouth daily.  Marland Kitchen latanoprost (XALATAN) 0.005 % ophthalmic solution Place 1 drop into both eyes at bedtime. wait 5 minutes between multiple eye drops  . naproxen (NAPROSYN) 500 MG tablet Take 1 tablet by mouth every 12 (twelve) hours as needed. for arthritis at the knee  . NON FORMULARY Diet: NAS,  . Nutritional Supplements (ENSURE ENLIVE PO) Take 237 mLs by mouth daily. due to poor meal intake  . oxybutynin (DITROPAN-XL) 10 MG 24 hr tablet Take 10 mg by mouth daily.  . pantoprazole (PROTONIX) 40 MG tablet Take 40 mg by mouth daily.  . Potassium Chloride ER 20 MEQ TBCR Take 20 mEq by mouth daily at 6 (six) AM.  . sodium chloride 0.9 % injection Flush PICC with 5cc normal saline before and after IV ABO administration and then follow with 3cc Heparin u-100. Once A Day 08:00 AM  . vitamin C (ASCORBIC ACID) 250 MG tablet Take 500 mg by mouth daily.  Marland Kitchen zinc oxide 20 % ointment Apply 1 application topically in the morning, at noon, and at bedtime. Apply to sacrum and bilateral buttocks every shift   No facility-administered encounter medications on file as of 09/24/2019.     SIGNIFICANT DIAGNOSTIC EXAMS   TODAY  09-03-19: chest x-ray: No acute abnormality.   09-03-19: ct of abdomen and pelvis:  1. Wall thickening of the distal esophagus, suspicious foresophagitis or reflux. 2. Slight heterogeneous nephrograms of both kidneys, may representpyelonephritis in the setting of recent urinary tract infection. No focal fluid collection. 3. No CT findings of acute cholecystitis. 4. Trabecular coarsening of the left inferior pubic ramus and ischium, may be sequela of prior trauma or Paget's disease. Aortic Atherosclerosis   09-04-19: right ankle x-ray: 1. No fracture, bone lesion or ankle joint abnormality.2. No evidence of osteomyelitis.  NO NEW EXAMS.   LABS REVIEWED PREVIOUS;   09-03-19: wbc  11.9; hgb 13.6; hct 43.6; mcv 83.8 plt 430; glucose 161; bun 28; creat 0.95 ;k+ 4.1; na++ 127; ca 9.1 liver normal albumin 3.9 urine culture: e-coli ESBL and 50,000 proteus mirabilis 09-08-19: wbc 6.1 hgb 10.6; hct 33.7; mcv 86.4 plt 389; glucose 100; bun 13; creat 0.54; k+ 4.2; na++ 134; ca 8.3   Review of Systems  Unable to perform ROS: Dementia (unable to participate )  Constitutional: Negative for malaise/fatigue.  Respiratory: Negative for cough and shortness of  breath.   Cardiovascular: Negative for chest pain, palpitations and leg swelling.  Gastrointestinal: Negative for abdominal pain, constipation and heartburn.  Musculoskeletal: Negative for back pain, joint pain and myalgias.  Skin: Negative.   Neurological: Negative for dizziness.  Psychiatric/Behavioral: The patient is not nervous/anxious.     Physical Exam Constitutional:      General: She is not in acute distress.    Appearance: She is well-developed. She is not diaphoretic.  Neck:     Thyroid: No thyromegaly.  Cardiovascular:     Rate and Rhythm: Normal rate and regular rhythm.     Pulses: Normal pulses.     Heart sounds: Normal heart sounds.  Pulmonary:     Effort: Pulmonary effort is normal. No respiratory distress.     Breath sounds: Normal breath sounds.  Abdominal:     General: Bowel sounds are normal. There is no distension.     Palpations: Abdomen is soft.     Tenderness: There is no abdominal tenderness.  Musculoskeletal:     Cervical back: Neck supple.     Right lower leg: No edema.     Left lower leg: No edema.     Comments: Is able to move all extremities  History of right knee replacement      Lymphadenopathy:     Cervical: No cervical adenopathy.  Skin:    General: Skin is warm and dry.  Neurological:     Mental Status: She is alert. Mental status is at baseline.  Psychiatric:        Mood and Affect: Mood normal.       ASSESSMENT/ PLAN:  TODAY  1. Chronic anemia 2. Dementia without  behavioral disturbance unspecified dementia type 3. Benign essential htn  Will continue current medications Will continue current plan of care Will continue therapy as directed Will continue to monitor her status.    MD is aware of resident's narcotic use and is in agreement with current plan of care. We will attempt to wean resident as appropriate.  Synthia Innocent NP [facility]  Contact [phone] Monday through Friday 8am- 5pm  After hours call [phone]

## 2019-09-26 DIAGNOSIS — K219 Gastro-esophageal reflux disease without esophagitis: Secondary | ICD-10-CM | POA: Insufficient documentation

## 2019-09-29 ENCOUNTER — Non-Acute Institutional Stay (SKILLED_NURSING_FACILITY): Payer: Medicare HMO | Admitting: Adult Health

## 2019-09-29 ENCOUNTER — Encounter: Payer: Self-pay | Admitting: Adult Health

## 2019-09-29 DIAGNOSIS — H40053 Ocular hypertension, bilateral: Secondary | ICD-10-CM | POA: Diagnosis not present

## 2019-09-29 DIAGNOSIS — E876 Hypokalemia: Secondary | ICD-10-CM

## 2019-09-29 DIAGNOSIS — N3281 Overactive bladder: Secondary | ICD-10-CM

## 2019-09-29 NOTE — Progress Notes (Signed)
Location:    [place] Room Number: 103/P Place of Service:  SNF (31)   CODE STATUS: Full Code  No Known Allergies  Chief Complaint  Patient presents with  . Medical Management of Chronic Issues          Urinary urgency    Hypokalemia Increased intraocular pressure bilateral   Weekly follow up for the first 30 days post hospitalization.      HPI:  She is a 85 year old long term resident of this facility being seen for the management of her chronic illnesses: urinary urgency; hypokalemia; urinary urgency. There are no reports of uncontrolled pain;no changes in appetite; no reports of anxiety or agitation.   Past Medical History:  Diagnosis Date  . Anemia   . Diabetes mellitus without complication ([place])   . Glaucoma   . Hypercholesteremia   . Hypertension   . Hypokalemia   . Paraplegia ([place])   . Status post total right knee replacement     Past Surgical History:  Procedure Laterality Date  . ABDOMINAL HYSTERECTOMY    . CATARACT EXTRACTION    . COLONOSCOPY N/A 12/10/2013   Procedure: COLONOSCOPY;  Surgeon: Danie Binder, MD;  Location: [facility];  Service: Endoscopy;  Laterality: N/A;  8:30 AM  . TOTAL KNEE ARTHROPLASTY Right 03/30/2016   Procedure: TOTAL KNEE ARTHROPLASTY;  Surgeon: Carole Civil, MD;  Location: [facility] ORS;  Service: Orthopedics;  Laterality: Right;    Social History   Socioeconomic History  . Marital status: Divorced    Spouse name: Not on file  . Number of children: Not on file  . Years of education: Not on file  . Highest education level: Not on file  Occupational History  . Not on file  Tobacco Use  . Smoking status: Current Some Day Smoker    Packs/day: 0.25    Years: 3.00    Pack years: 0.75    Types: Cigarettes  . Smokeless tobacco: Never Used  Substance and Sexual Activity  . Alcohol use: No  . Drug use: No  . Sexual activity: Not Currently    Birth control/protection: Post-menopausal  Other Topics  Concern  . Not on file  Social History Narrative  . Not on file   Social Determinants of Health   Financial Resource Strain:   . Difficulty of Paying Living Expenses: Not on file  Food Insecurity:   . Worried About Charity fundraiser in the Last Year: Not on file  . Ran Out of Food in the Last Year: Not on file  Transportation Needs:   . Lack of Transportation (Medical): Not on file  . Lack of Transportation (Non-Medical): Not on file  Physical Activity:   . Days of Exercise per Week: Not on file  . Minutes of Exercise per Session: Not on file  Stress:   . Feeling of Stress : Not on file  Social Connections:   . Frequency of Communication with Friends and Family: Not on file  . Frequency of Social Gatherings with Friends and Family: Not on file  . Attends Religious Services: Not on file  . Active Member of Clubs or Organizations: Not on file  . Attends Archivist Meetings: Not on file  . Marital Status: Not on file  Intimate Partner Violence:   . Fear of Current or Ex-Partner: Not on file  . Emotionally Abused: Not on file  . Physically Abused: Not on file  . Sexually Abused:  Not on file   Family History  Problem Relation Age of Onset  . Cancer Mother   . Arthritis Mother   . Hypotension Father   . Cancer Sister   . Hypotension Sister   . Colon cancer Neg Hx       VITAL SIGNS BP 128/68   Pulse (!) 103   Temp 98 F (36.7 C) (Oral)   Resp 16   Ht 5\' 5"  (1.651 m)   Wt 177 lb (80.3 kg)   SpO2 95%   BMI 29.45 kg/m   Outpatient Encounter Medications as of 09/29/2019  Medication Sig  . acetaminophen (TYLENOL) 325 MG tablet Take 650 mg by mouth 3 (three) times daily.   . Amino Acids-Protein Hydrolys (FEEDING SUPPLEMENT, PRO-STAT SUGAR FREE 64,) LIQD Take 30 mLs by mouth 2 (two) times daily.  11/29/2019 aspirin EC 81 MG tablet Take 81 mg by mouth daily.  . calcium citrate (CALCITRATE - DOSED IN MG ELEMENTAL CALCIUM) 950 MG tablet Take 200 mg of elemental calcium  by mouth daily.  . diclofenac Sodium (VOLTAREN) 1 % GEL Apply 4 g topically 3 (three) times daily.   . iron polysaccharides (FERREX 150) 150 MG capsule Take 1 capsule by mouth daily.  Marland Kitchen latanoprost (XALATAN) 0.005 % ophthalmic solution Place 1 drop into both eyes at bedtime. wait 5 minutes between multiple eye drops  . naproxen (NAPROSYN) 500 MG tablet Take 1 tablet by mouth every 12 (twelve) hours as needed. for arthritis at the knee  . NON FORMULARY Diet: NAS,  . Nutritional Supplements (ENSURE ENLIVE PO) Take 237 mLs by mouth daily. due to poor meal intake  . oxybutynin (DITROPAN-XL) 10 MG 24 hr tablet Take 10 mg by mouth daily.  . pantoprazole (PROTONIX) 40 MG tablet Take 40 mg by mouth daily.  . Potassium Chloride ER 20 MEQ TBCR Take 20 mEq by mouth daily at 6 (six) AM.  . vitamin C (ASCORBIC ACID) 250 MG tablet Take 500 mg by mouth daily.  Marland Kitchen zinc oxide 20 % ointment Apply 1 application topically in the morning, at noon, and at bedtime. Apply to sacrum and bilateral buttocks every shift  . [DISCONTINUED] pantoprazole (PROTONIX) 40 MG tablet Take 40 mg by mouth daily.  . [DISCONTINUED] sodium chloride 0.9 % injection Flush PICC with 5cc normal saline before and after IV ABO administration and then follow with 3cc Heparin u-100. Once A Day 08:00 AM   No facility-administered encounter medications on file as of 09/29/2019.     SIGNIFICANT DIAGNOSTIC EXAMS   TODAY  09-03-19: chest x-ray: No acute abnormality.   09-03-19: ct of abdomen and pelvis:  1. Wall thickening of the distal esophagus, suspicious foresophagitis or reflux. 2. Slight heterogeneous nephrograms of both kidneys, may representpyelonephritis in the setting of recent urinary tract infection. No focal fluid collection. 3. No CT findings of acute cholecystitis. 4. Trabecular coarsening of the left inferior pubic ramus and ischium, may be sequela of prior trauma or Paget's disease. Aortic Atherosclerosis   09-04-19: right  ankle x-ray: 1. No fracture, bone lesion or ankle joint abnormality.2. No evidence of osteomyelitis.  NO NEW EXAMS.   LABS REVIEWED PREVIOUS;   09-03-19: wbc 11.9; hgb 13.6; hct 43.6; mcv 83.8 plt 430; glucose 161; bun 28; creat 0.95 ;k+ 4.1; na++ 127; ca 9.1 liver normal albumin 3.9 urine culture: e-coli ESBL and 50,000 proteus mirabilis 09-08-19: wbc 6.1 hgb 10.6; hct 33.7; mcv 86.4 plt 389; glucose 100; bun 13; creat 0.54; k+ 4.2; na++  134; ca 8.3  NO NEW LABS.   Review of Systems  Unable to perform ROS: Dementia (unable to participate )   Physical Exam Constitutional:      General: She is not in acute distress.    Appearance: She is well-developed. She is not diaphoretic.  Neck:     Thyroid: No thyromegaly.  Cardiovascular:     Rate and Rhythm: Normal rate and regular rhythm.     Pulses: Normal pulses.     Heart sounds: Normal heart sounds.  Pulmonary:     Effort: Pulmonary effort is normal. No respiratory distress.     Breath sounds: Normal breath sounds.  Abdominal:     General: Bowel sounds are normal. There is no distension.     Palpations: Abdomen is soft.     Tenderness: There is no abdominal tenderness.  Musculoskeletal:     Cervical back: Neck supple.     Right lower leg: No edema.     Left lower leg: No edema.     Comments: Is able to move all extremities  History of right knee replacement       Lymphadenopathy:     Cervical: No cervical adenopathy.  Skin:    General: Skin is warm and dry.  Neurological:     Mental Status: She is alert. Mental status is at baseline.  Psychiatric:        Mood and Affect: Mood normal.     ASSESSMENT/ PLAN:  TODAY;   1. Urinary urgency is stable will continue ditropan xl 10 mg daily  2. Hypokalemia is stable k+ 4.2 will continue k+ 20 meq daily   3. Increased intraocular pressure bilateral: is stable will continue xalatan to both eyes.   PREVIOUS  4. Primary osteoarthritis right knee is stable will continue tylenol  650 mg three times daily and voltaren gel 4 gm to right knee three times daily   5. Essential benign HTN: is stable b/p 128/68 will monitor will begin asa 81 mg daily   6. Chronic anemia: is stable hgb 10.6; will continue ferrex daily   7. GERD without esophagitis: is stable will continue protonix 40 mg daily     MD is aware of resident's narcotic use and is in agreement with current plan of care. We will attempt to wean resident as appropriate.  Synthia Innocent NP [facility]  Contact [phone] Monday through Friday 8am- 5pm  After hours call [phone]

## 2019-10-06 ENCOUNTER — Encounter: Payer: Self-pay | Admitting: Adult Health

## 2019-10-06 ENCOUNTER — Non-Acute Institutional Stay (SKILLED_NURSING_FACILITY): Payer: Medicare HMO | Admitting: Adult Health

## 2019-10-06 DIAGNOSIS — M1711 Unilateral primary osteoarthritis, right knee: Secondary | ICD-10-CM

## 2019-10-06 DIAGNOSIS — I10 Essential (primary) hypertension: Secondary | ICD-10-CM

## 2019-10-06 DIAGNOSIS — D649 Anemia, unspecified: Secondary | ICD-10-CM

## 2019-10-06 NOTE — Progress Notes (Signed)
Location:    [place] Room Number: 105/D Place of Service:  SNF (31)   CODE STATUS: Full Code  No Known Allergies  Chief Complaint  Patient presents with  . Medical Management of Chronic Issues        Primary osteoarthritis right knee:  essential benign HTN  Chronic anemia:    HPI:  She is a 85 year old long term resident of this facility being seen for the management of her chronic illnesses: right knee arthritis; hypertension; anemia. There are no reports of uncontrolled pain; no changes in her appetite; no reports of anxiety or agitation.   Past Medical History:  Diagnosis Date  . Anemia   . Diabetes mellitus without complication ([place])   . Glaucoma   . Hypercholesteremia   . Hypertension   . Hypokalemia   . Paraplegia ([place])   . Status post total right knee replacement     Past Surgical History:  Procedure Laterality Date  . ABDOMINAL HYSTERECTOMY    . CATARACT EXTRACTION    . COLONOSCOPY N/A 12/10/2013   Procedure: COLONOSCOPY;  Surgeon: Danie Binder, MD;  [facility];  Service: Endoscopy;  Laterality: N/A;  8:30 AM  . TOTAL KNEE ARTHROPLASTY Right 03/30/2016   Procedure: TOTAL KNEE ARTHROPLASTY;  Surgeon: Carole Civil, MD;  Location: [facility] ORS;  Service: Orthopedics;  Laterality: Right;    Social History   Socioeconomic History  . Marital status: Divorced    Spouse name: Not on file  . Number of children: Not on file  . Years of education: Not on file  . Highest education level: Not on file  Occupational History  . Not on file  Tobacco Use  . Smoking status: Current Some Day Smoker    Packs/day: 0.25    Years: 3.00    Pack years: 0.75    Types: Cigarettes  . Smokeless tobacco: Never Used  Substance and Sexual Activity  . Alcohol use: No  . Drug use: No  . Sexual activity: Not Currently    Birth control/protection: Post-menopausal  Other Topics Concern  . Not on file  Social History Narrative  . Not on file     Social Determinants of Health   Financial Resource Strain:   . Difficulty of Paying Living Expenses:   Food Insecurity:   . Worried About Charity fundraiser in the Last Year:   . Arboriculturist in the Last Year:   Transportation Needs:   . Film/video editor (Medical):   Marland Kitchen Lack of Transportation (Non-Medical):   Physical Activity:   . Days of Exercise per Week:   . Minutes of Exercise per Session:   Stress:   . Feeling of Stress :   Social Connections:   . Frequency of Communication with Friends and Family:   . Frequency of Social Gatherings with Friends and Family:   . Attends Religious Services:   . Active Member of Clubs or Organizations:   . Attends Archivist Meetings:   Marland Kitchen Marital Status:   Intimate Partner Violence:   . Fear of Current or Ex-Partner:   . Emotionally Abused:   Marland Kitchen Physically Abused:   . Sexually Abused:    Family History  Problem Relation Age of Onset  . Cancer Mother   . Arthritis Mother   . Hypotension Father   . Cancer Sister   . Hypotension Sister   . Colon cancer Neg Hx  VITAL SIGNS BP 98/65   Pulse 76   Temp 98.4 F (36.9 C) (Oral)   Resp 16   Ht 5\' 5"  (1.651 m)   Wt 177 lb (80.3 kg)   SpO2 95%   BMI 29.45 kg/m   Outpatient Encounter Medications as of 10/06/2019  Medication Sig  . acetaminophen (TYLENOL) 325 MG tablet Take 650 mg by mouth 3 (three) times daily.   . Amino Acids-Protein Hydrolys (FEEDING SUPPLEMENT, PRO-STAT SUGAR FREE 64,) LIQD Take 30 mLs by mouth 2 (two) times daily.  10/08/2019 aspirin EC 81 MG tablet Take 81 mg by mouth daily.  . calcium citrate (CALCITRATE - DOSED IN MG ELEMENTAL CALCIUM) 950 MG tablet Take 200 mg of elemental calcium by mouth daily.  . diclofenac Sodium (VOLTAREN) 1 % GEL Apply 4 g topically 3 (three) times daily.   . iron polysaccharides (FERREX 150) 150 MG capsule Take 1 capsule by mouth daily.  Marland Kitchen latanoprost (XALATAN) 0.005 % ophthalmic solution Place 1 drop into both eyes at  bedtime. wait 5 minutes between multiple eye drops  . naproxen (NAPROSYN) 500 MG tablet Take 1 tablet by mouth every 12 (twelve) hours as needed. for arthritis at the knee  . NON FORMULARY Diet: NAS,  . Nutritional Supplements (ENSURE ENLIVE PO) Take 237 mLs by mouth daily. due to poor meal intake  . oxybutynin (DITROPAN-XL) 10 MG 24 hr tablet Take 10 mg by mouth daily.  . pantoprazole (PROTONIX) 40 MG tablet Take 40 mg by mouth daily.  . Potassium Chloride ER 20 MEQ TBCR Take 20 mEq by mouth daily at 6 (six) AM.  . vitamin C (ASCORBIC ACID) 250 MG tablet Take 500 mg by mouth daily.  Marland Kitchen zinc oxide 20 % ointment Apply 1 application topically in the morning, at noon, and at bedtime. Apply to sacrum and bilateral buttocks every shift   No facility-administered encounter medications on file as of 10/06/2019.     SIGNIFICANT DIAGNOSTIC EXAMS   TODAY  09-03-19: chest x-ray: No acute abnormality.   09-03-19: ct of abdomen and pelvis:  1. Wall thickening of the distal esophagus, suspicious foresophagitis or reflux. 2. Slight heterogeneous nephrograms of both kidneys, may representpyelonephritis in the setting of recent urinary tract infection. No focal fluid collection. 3. No CT findings of acute cholecystitis. 4. Trabecular coarsening of the left inferior pubic ramus and ischium, may be sequela of prior trauma or Paget's disease. Aortic Atherosclerosis   09-04-19: right ankle x-ray: 1. No fracture, bone lesion or ankle joint abnormality.2. No evidence of osteomyelitis.  NO NEW EXAMS.   LABS REVIEWED PREVIOUS;   09-03-19: wbc 11.9; hgb 13.6; hct 43.6; mcv 83.8 plt 430; glucose 161; bun 28; creat 0.95 ;k+ 4.1; na++ 127; ca 9.1 liver normal albumin 3.9 urine culture: e-coli ESBL and 50,000 proteus mirabilis 09-08-19: wbc 6.1 hgb 10.6; hct 33.7; mcv 86.4 plt 389; glucose 100; bun 13; creat 0.54; k+ 4.2; na++ 134; ca 8.3  TODAY  09-24-19: wbc 5.0; hgb 11.4; hct 37.2; mcv 88.2 plt 441; glucose 137;  bun 19; creat 0.59; k+ 4.3; na++ 136; ca 8.7    Review of Systems  Unable to perform ROS: Dementia (unable to participate )   Physical Exam Constitutional:      General: She is not in acute distress.    Appearance: She is well-developed. She is not diaphoretic.  Neck:     Thyroid: No thyromegaly.  Cardiovascular:     Rate and Rhythm: Normal rate and regular rhythm.  Pulses: Normal pulses.     Heart sounds: Normal heart sounds.  Pulmonary:     Effort: Pulmonary effort is normal. No respiratory distress.     Breath sounds: Normal breath sounds.  Abdominal:     General: Bowel sounds are normal. There is no distension.     Palpations: Abdomen is soft.     Tenderness: There is no abdominal tenderness.  Musculoskeletal:     Cervical back: Neck supple.     Right lower leg: No edema.     Left lower leg: No edema.     Comments:  Is able to move all extremities  History of right knee replacement        Lymphadenopathy:     Cervical: No cervical adenopathy.  Skin:    General: Skin is warm and dry.  Neurological:     Mental Status: She is alert. Mental status is at baseline.  Psychiatric:        Mood and Affect: Mood normal.     ASSESSMENT/ PLAN:  TODAY;   1. Primary osteoarthritis right knee: is stable will continue tylenol 650 mg three times daily voltaren gel 4 gm to right knee three times daily   2. essential benign HTN: is stable b/p 98/65 will continue asa 81 mg daily   3. Chronic anemia: is stable hgb 11.4 will continue ferrex daily   PREVIOUS  4. GERD without esophagitis: is stable will continue protonix 40 mg daily   5. Urinary urgency is stable will continue ditropan xl 10 mg daily  6. Hypokalemia is stable k+ 4.2 will continue k+ 20 meq daily   7. Increased intraocular pressure bilateral: is stable will continue xalatan to both eyes.       MD is aware of resident's narcotic use and is in agreement with current plan of care. We will attempt to wean  resident as appropriate.  Synthia Innocent NP [facility]  Contact [phone] Monday through Friday 8am- 5pm  After hours call [phone]

## 2019-11-12 ENCOUNTER — Encounter: Payer: Self-pay | Admitting: Internal Medicine

## 2019-11-12 ENCOUNTER — Non-Acute Institutional Stay (SKILLED_NURSING_FACILITY): Payer: Medicare HMO | Admitting: Internal Medicine

## 2019-11-12 DIAGNOSIS — F0390 Unspecified dementia without behavioral disturbance: Secondary | ICD-10-CM

## 2019-11-12 DIAGNOSIS — K219 Gastro-esophageal reflux disease without esophagitis: Secondary | ICD-10-CM

## 2019-11-12 DIAGNOSIS — M1711 Unilateral primary osteoarthritis, right knee: Secondary | ICD-10-CM | POA: Diagnosis not present

## 2019-11-12 NOTE — Progress Notes (Signed)
Location:  [facility] Room Number: 105-D Place of Service:  SNF (31)  [name], MD  Patient Care Team: [name], MD as PCP - General [facility])  Extended Emergency Contact Information Primary Emergency Contact: [name], [place] [address] [place] Home Phone: [phone] Relation: Relative Secondary Emergency Contact: [name], [place] [address] [place] Home Phone: [phone] Relation: Relative    Allergies: Patient has no known allergies.  Chief Complaint  Patient presents with  . Medical Management of Chronic Issues    Routine [facility] visit  . Immunizations    Needs Tdap and Prevnar  . Quality Metric Gaps    DEXA    HPI: Patient is an 85 y.o. female who is being seen for routine issues of GERD, dementia, and osteoarthritis arthritis of right knee.  Past Medical History:  Diagnosis Date  . Anemia   . Diabetes mellitus without complication (HCC)   . Glaucoma   . Hypercholesteremia   . Hypertension   . Hypokalemia   . Paraplegia ([facility])   . Status post total right knee replacement     Past Surgical History:  Procedure Laterality Date  . ABDOMINAL HYSTERECTOMY    . CATARACT EXTRACTION    . COLONOSCOPY N/A 12/10/2013   Procedure: COLONOSCOPY;  Surgeon: [place] Bali, MD;  [facility];  Service: Endoscopy;  Laterality: N/A;  8:30 AM  . TOTAL KNEE ARTHROPLASTY Right 03/30/2016   Procedure: TOTAL KNEE ARTHROPLASTY;  Surgeon: Vickki Hearing, MD;  Location: [facility] ORS;  Service: Orthopedics;  Laterality: Right;    Allergies as of 11/12/2019   No Known Allergies     Medication List    Notice   This visit is during an admission. Changes to the med list made in this visit will be reflected in the After Visit Summary of the admission.    Current Outpatient Medications on File Prior to Visit  Medication Sig Dispense Refill  .  acetaminophen (TYLENOL) 325 MG tablet Take 650 mg by mouth 3 (three) times daily.     Marland Kitchen aspirin EC 81 MG tablet Take 81 mg by mouth daily.    . calcium citrate (CALCITRATE - DOSED IN MG ELEMENTAL CALCIUM) 950 MG tablet Take 200 mg of elemental calcium by mouth daily.    . diclofenac Sodium (VOLTAREN) 1 % GEL Apply 4 g topically 3 (three) times daily.     . iron polysaccharides (FERREX 150) 150 MG capsule Take 1 capsule by mouth daily.    Marland Kitchen latanoprost (XALATAN) 0.005 % ophthalmic solution Place 1 drop into both eyes at bedtime. wait 5 minutes between multiple eye drops    . naproxen (NAPROSYN) 500 MG tablet Take 1 tablet by mouth every 12 (twelve) hours as needed. for arthritis at the knee    . NON FORMULARY Diet: NAS,    . oxybutynin (DITROPAN-XL) 10 MG 24 hr tablet Take 10 mg by mouth daily.    . pantoprazole (PROTONIX) 40 MG tablet Take 40 mg by mouth daily.    . Potassium Chloride ER 20 MEQ TBCR Take 20 mEq by mouth daily at 6 (six) AM.    . vitamin C (ASCORBIC ACID) 250 MG tablet Take 500 mg by mouth daily.    Marland Kitchen zinc oxide 20 % ointment Apply 1 application topically in the morning, at noon, and at  bedtime. Apply to sacrum and bilateral buttocks every shift     No current facility-administered medications on file prior to visit.     No orders of the defined types were placed in this encounter.   Immunization History  Administered Date(s) Administered  . Moderna SARS-COVID-2 Vaccination 08/03/2019, 08/31/2019  . PPD Test 10/03/2016    Social History   Tobacco Use  . Smoking status: Current Some Day Smoker    Packs/day: 0.25    Years: 3.00    Pack years: 0.75    Types: Cigarettes  . Smokeless tobacco: Never Used  Substance Use Topics  . Alcohol use: No    Review of Systems   unable to obtain secondary to dementia    Vitals:   11/12/19 1512  BP: (!) 142/90  Pulse: 94  Resp: 20  Temp: 97.9 F (36.6 C)  SpO2: 95%   Body mass index is 31.18 kg/m. Physical  Exam  GENERAL APPEARANCE: Alert, conversant, No acute distress  SKIN: No diaphoresis rash HEENT: Unremarkable RESPIRATORY: Breathing is even, unlabored. Lung sounds are clear   CARDIOVASCULAR: Heart RRR no murmurs, rubs or gallops. No peripheral edema  GASTROINTESTINAL: Abdomen is soft, non-tender, not distended w/ normal bowel sounds.  GENITOURINARY: Bladder non tender, not distended  MUSCULOSKELETAL: No abnormal joints or musculature NEUROLOGIC: Cranial nerves 2-12 grossly intact. Moves all extremities PSYCHIATRIC: Mood and affect with dementia, no behavioral issues  Patient Active Problem List   Diagnosis Date Noted  . GERD without esophagitis 09/26/2019  . OAB (overactive bladder) 09/13/2019  . Urinary urgency 09/10/2019  . Increased intraocular pressure, bilateral 09/10/2019  . ESBL (extended spectrum beta-lactamase) producing bacteria infection 09/05/2019  . Sepsis ([place]) 09/03/2019  . Acute pyelonephritis 09/03/2019  . Dementia ([place]) 09/03/2019  . Acute metabolic encephalopathy 42/70/6237  . UTI (urinary tract infection) 12/03/2016  . Benign essential HTN 12/02/2016  . Immobility 09/30/2016  . Hypokalemia 09/30/2016  . Pressure ulcer, stage 2 ([place]) 09/30/2016  . AKI (acute kidney injury) ([place]) 09/30/2016  . Hypothermia 09/30/2016  . Chronic anemia 04/02/2016  . Hyponatremia 04/02/2016  . Osteoarthritis of right knee 03/30/2016    CMP     Component Value Date/Time   NA 136 09/24/2019 0612   NA 136 (A) 10/09/2016 0000   K 4.3 09/24/2019 0612   CL 100 09/24/2019 0612   CO2 27 09/24/2019 0612   GLUCOSE 137 (H) 09/24/2019 0612   BUN 19 09/24/2019 0612   BUN 8 10/09/2016 0000   CREATININE 0.59 09/24/2019 0612   CALCIUM 8.7 (L) 09/24/2019 0612   PROT 7.5 09/03/2019 1136   ALBUMIN 3.9 09/03/2019 1136   AST 20 09/03/2019 1136   ALT 16 09/03/2019 1136   ALKPHOS 66 09/03/2019 1136   BILITOT 0.9 09/03/2019 1136   GFRNONAA >60 09/24/2019 0612   GFRAA >60 09/24/2019  0612   Recent Labs    09/05/19 0517 09/05/19 0517 09/06/19 0758 09/08/19 0854 09/17/19 0630 09/22/19 1240 09/24/19 0612  NA 129*   < > 135   < > 133* 132* 136  K 3.8   < > 3.7   < > 4.2 4.7 4.3  CL 97*   < > 99   < > 98 101 100  CO2 21*   < > 26   < > 26 20* 27  GLUCOSE 100*   < > 113*   < > 163* 109* 137*  BUN 13   < > 11   < > 14 17 19  CREATININE 0.55   < > 0.46   < > 0.53 0.67 0.59  CALCIUM 8.2*   < > 8.4*   < > 8.7* 8.6* 8.7*  MG 2.2  --  2.0  --   --   --   --    < > = values in this interval not displayed.   Recent Labs    09/03/19 1136  AST 20  ALT 16  ALKPHOS 66  BILITOT 0.9  PROT 7.5  ALBUMIN 3.9   Recent Labs    09/10/19 0925 09/10/19 0925 09/17/19 0630 09/22/19 1240 09/24/19 0612  WBC 4.6   < > 6.5 6.9 5.0  NEUTROABS 2.2  --  5.5  --  2.3  HGB 11.2*   < > 12.0 12.9 11.4*  HCT 35.8*   < > 38.7 42.7 37.2  MCV 85.9   < > 86.2 89.1 88.2  PLT PLATELET CLUMPS NOTED ON SMEAR, COUNT APPEARS ADEQUATE   < > 400 428* 441*   < > = values in this interval not displayed.   No results for input(s): CHOL, LDLCALC, TRIG in the last 8760 hours.  Invalid input(s): HCL No results found for: [facility] Lab Results  Component Value Date   TSH 1.647 12/13/2016   Lab Results  Component Value Date   HGBA1C 6.0 11/08/2016   Lab Results  Component Value Date   CHOL 184 11/08/2016   HDL 59 11/08/2016   LDLCALC 90 11/08/2016   TRIG 175 (A) 11/08/2016    Significant Diagnostic Results in last 30 days:  No results found.  Assessment and Plan  GERD without esophagitis Chronic and stable; continue Protonix 40 mg daily  Dementia (HCC) On no dementia specific medications; continue supportive care  Osteoarthritis of right knee Appears comfortable; continue Voltaren gel 3 times daily and Tylenol 650 mg 3 times daily scheduled as well    Margit Hanks, MD

## 2019-11-22 ENCOUNTER — Encounter: Payer: Self-pay | Admitting: Internal Medicine

## 2019-11-22 NOTE — Assessment & Plan Note (Signed)
On no dementia specific medications; continue supportive care 

## 2019-11-22 NOTE — Assessment & Plan Note (Signed)
Chronic and stable; continue Protonix 40 mg daily 

## 2019-11-22 NOTE — Assessment & Plan Note (Signed)
Appears comfortable; continue Voltaren gel 3 times daily and Tylenol 650 mg 3 times daily scheduled as well

## 2019-12-15 ENCOUNTER — Non-Acute Institutional Stay (SKILLED_NURSING_FACILITY): Payer: Medicare HMO | Admitting: Adult Health

## 2019-12-15 ENCOUNTER — Encounter: Payer: Self-pay | Admitting: Adult Health

## 2019-12-15 DIAGNOSIS — N3281 Overactive bladder: Secondary | ICD-10-CM | POA: Diagnosis not present

## 2019-12-15 DIAGNOSIS — K219 Gastro-esophageal reflux disease without esophagitis: Secondary | ICD-10-CM | POA: Diagnosis not present

## 2019-12-15 DIAGNOSIS — R3915 Urgency of urination: Secondary | ICD-10-CM

## 2019-12-15 NOTE — Progress Notes (Signed)
Location:    [facility] Room Number: 105/D Place of Service:  SNF (31)   CODE STATUS: Full Code  No Known Allergies  Chief Complaint  Patient presents with  . Medical Management of Chronic Issues          GERD without esophagitis:    Urinary urgency:    Hypokalemia:     HPI:  She is a 85 year old long term resident of this facility being seen for the management of her chronic illnesses; gerd; urinary urgency; hypokalemia. There are no reports of changes in appetite; no heart burn; no uncontrolled pain; no anxiety.   Past Medical History:  Diagnosis Date  . Anemia   . Diabetes mellitus without complication (HCC)   . Glaucoma   . Hypercholesteremia   . Hypertension   . Hypokalemia   . Paraplegia (HCC)   . Status post total right knee replacement     Past Surgical History:  Procedure Laterality Date  . ABDOMINAL HYSTERECTOMY    . CATARACT EXTRACTION    . COLONOSCOPY N/A 12/10/2013   Procedure: COLONOSCOPY;  Surgeon: West Bali, MD;  Location: [facility];  Service: Endoscopy;  Laterality: N/A;  8:30 AM  . TOTAL KNEE ARTHROPLASTY Right 03/30/2016   Procedure: TOTAL KNEE ARTHROPLASTY;  Surgeon: Vickki Hearing, MD;  Location: [facility] ORS;  Service: Orthopedics;  Laterality: Right;    Social History   Socioeconomic History  . Marital status: Divorced    Spouse name: Not on file  . Number of children: Not on file  . Years of education: Not on file  . Highest education level: Not on file  Occupational History  . Not on file  Tobacco Use  . Smoking status: Current Some Day Smoker    Packs/day: 0.25    Years: 3.00    Pack years: 0.75    Types: Cigarettes  . Smokeless tobacco: Never Used  Substance and Sexual Activity  . Alcohol use: No  . Drug use: No  . Sexual activity: Not Currently    Birth control/protection: Post-menopausal  Other Topics Concern  . Not on file  Social History Narrative  . Not on file   Social Determinants of  Health   Financial Resource Strain:   . Difficulty of Paying Living Expenses:   Food Insecurity:   . Worried About Programme researcher, broadcasting/film/video in the Last Year:   . Barista in the Last Year:   Transportation Needs:   . Freight forwarder (Medical):   Marland Kitchen Lack of Transportation (Non-Medical):   Physical Activity:   . Days of Exercise per Week:   . Minutes of Exercise per Session:   Stress:   . Feeling of Stress :   Social Connections:   . Frequency of Communication with Friends and Family:   . Frequency of Social Gatherings with Friends and Family:   . Attends Religious Services:   . Active Member of Clubs or Organizations:   . Attends Banker Meetings:   Marland Kitchen Marital Status:   Intimate Partner Violence:   . Fear of Current or Ex-Partner:   . Emotionally Abused:   Marland Kitchen Physically Abused:   . Sexually Abused:    Family History  Problem Relation Age of Onset  . Cancer Mother   . Arthritis Mother   . Hypotension Father   . Cancer Sister   . Hypotension Sister   . Colon cancer Neg Hx  VITAL SIGNS BP 124/65   Pulse 88   Temp (!) 96.1 F (35.6 C) (Oral)   Resp 20   Ht 5\' 5"  (1.651 m)   Wt 187 lb (84.8 kg)   SpO2 95%   BMI 31.12 kg/m   Outpatient Encounter Medications as of 12/15/2019  Medication Sig  . acetaminophen (TYLENOL) 325 MG tablet Take 650 mg by mouth 3 (three) times daily.   Marland Kitchen aspirin EC 81 MG tablet Take 81 mg by mouth daily.  . calcium citrate (CALCITRATE - DOSED IN MG ELEMENTAL CALCIUM) 950 MG tablet Take 200 mg of elemental calcium by mouth daily.  . diclofenac Sodium (VOLTAREN) 1 % GEL Apply 4 g topically 3 (three) times daily.   . iron polysaccharides (FERREX 150) 150 MG capsule Take 1 capsule by mouth daily.  Marland Kitchen latanoprost (XALATAN) 0.005 % ophthalmic solution Place 1 drop into both eyes at bedtime. wait 5 minutes between multiple eye drops  . naproxen (NAPROSYN) 500 MG tablet Take 1 tablet by mouth every 12 (twelve) hours as needed.  for arthritis at the knee  . NON FORMULARY Diet: NAS,  . oxybutynin (DITROPAN-XL) 10 MG 24 hr tablet Take 10 mg by mouth daily.  . pantoprazole (PROTONIX) 40 MG tablet Take 40 mg by mouth daily.  . Potassium Chloride ER 20 MEQ TBCR Take 20 mEq by mouth daily at 6 (six) AM.  . vitamin C (ASCORBIC ACID) 250 MG tablet Take 500 mg by mouth daily.  Marland Kitchen zinc oxide 20 % ointment Apply 1 application topically in the morning, at noon, and at bedtime. Apply to sacrum and bilateral buttocks every shift   No facility-administered encounter medications on file as of 12/15/2019.     SIGNIFICANT DIAGNOSTIC EXAMS   TODAY  09-03-19: chest x-ray: No acute abnormality.   09-03-19: ct of abdomen and pelvis:  1. Wall thickening of the distal esophagus, suspicious foresophagitis or reflux. 2. Slight heterogeneous nephrograms of both kidneys, may representpyelonephritis in the setting of recent urinary tract infection. No focal fluid collection. 3. No CT findings of acute cholecystitis. 4. Trabecular coarsening of the left inferior pubic ramus and ischium, may be sequela of prior trauma or Paget's disease. Aortic Atherosclerosis   09-04-19: right ankle x-ray: 1. No fracture, bone lesion or ankle joint abnormality.2. No evidence of osteomyelitis.  NO NEW EXAMS.   LABS REVIEWED PREVIOUS;   09-03-19: wbc 11.9; hgb 13.6; hct 43.6; mcv 83.8 plt 430; glucose 161; bun 28; creat 0.95 ;k+ 4.1; na++ 127; ca 9.1 liver normal albumin 3.9 urine culture: e-coli ESBL and 50,000 proteus mirabilis 09-08-19: wbc 6.1 hgb 10.6; hct 33.7; mcv 86.4 plt 389; glucose 100; bun 13; creat 0.54; k+ 4.2; na++ 134; ca 8.3 09-24-19: wbc 5.0; hgb 11.4; hct 37.2; mcv 88.2 plt 441; glucose 137; bun 19; creat 0.59; k+ 4.3; na++ 136; ca 8.7  NO NEW LABS.    Review of Systems  Unable to perform ROS: Dementia (unable to participate )   Physical Exam Constitutional:      General: She is not in acute distress.    Appearance: She is  well-developed. She is not diaphoretic.  Neck:     Thyroid: No thyromegaly.  Cardiovascular:     Rate and Rhythm: Normal rate and regular rhythm.     Pulses: Normal pulses.     Heart sounds: Normal heart sounds.  Pulmonary:     Effort: Pulmonary effort is normal. No respiratory distress.     Breath sounds: Normal breath  sounds.  Abdominal:     General: Bowel sounds are normal. There is no distension.     Palpations: Abdomen is soft.     Tenderness: There is no abdominal tenderness.  Musculoskeletal:     Cervical back: Neck supple.     Right lower leg: No edema.     Left lower leg: No edema.     Comments: Is able to move all extremities  History of right knee replacement         Lymphadenopathy:     Cervical: No cervical adenopathy.  Skin:    General: Skin is warm and dry.  Neurological:     Mental Status: She is alert. Mental status is at baseline.  Psychiatric:        Mood and Affect: Mood normal.      ASSESSMENT/ PLAN:  TODAY;   1. GERD without esophagitis: is stable will continue protonix 40 mg daily   2. Urinary urgency: is stable will continue ditropan xl 10 mg daily   3. Hypokalemia: is stable k+ 4.2 will continue k+ 20 meq daily   PREVIOUS  4. Increased intraocular pressure bilateral: is stable will continue xalatan to both eyes.   5. Primary osteoarthritis right knee: is stable will continue tylenol 650 mg three times daily voltaren gel 4 gm to right knee three times daily   6. essential benign HTN: is stable b/p 124/65 will continue asa 81 mg daily   7. Chronic anemia: is stable hgb 11.4 will continue ferrex daily   Will setup DEXA scan          MD is aware of resident's narcotic use and is in agreement with current plan of care. We will attempt to wean resident as appropriate.  Synthia Innocent NP [facility]  Contact [phone] Monday through Friday 8am- 5pm  After hours call [phone]

## 2019-12-18 ENCOUNTER — Encounter: Payer: Self-pay | Admitting: Adult Health

## 2019-12-18 ENCOUNTER — Non-Acute Institutional Stay (SKILLED_NURSING_FACILITY): Payer: Medicare HMO | Admitting: Adult Health

## 2019-12-18 DIAGNOSIS — I10 Essential (primary) hypertension: Secondary | ICD-10-CM | POA: Diagnosis not present

## 2019-12-18 DIAGNOSIS — D649 Anemia, unspecified: Secondary | ICD-10-CM | POA: Diagnosis not present

## 2019-12-18 DIAGNOSIS — F0390 Unspecified dementia without behavioral disturbance: Secondary | ICD-10-CM | POA: Diagnosis not present

## 2019-12-18 NOTE — Progress Notes (Signed)
Location:    [place] Room Number: 105/D Place of Service:  SNF (31)   CODE STATUS: Full Code  No Known Allergies  Chief Complaint  Patient presents with  . Acute Visit    Care Plan Meeting    HPI:  We have come together for her care plan meeting. BIMS 14/15 mood 6/30. No reports of falls. She has gained 10 pounds since her admission to this facility. No reports of anxiety or uncontrolled pain. She requires extensive to total care with adls. She is incontinent of bladder and bowel. She continues to be followed for her chronic illnesses including: Dementia without behavioral disturbance unspecified dementia type   Essential hypertension Chronic anemia  Past Medical History:  Diagnosis Date  . Anemia   . Diabetes mellitus without complication ([place])   . Glaucoma   . Hypercholesteremia   . Hypertension   . Hypokalemia   . Paraplegia ([place])   . Status post total right knee replacement     Past Surgical History:  Procedure Laterality Date  . ABDOMINAL HYSTERECTOMY    . CATARACT EXTRACTION    . COLONOSCOPY N/A 12/10/2013   Procedure: COLONOSCOPY;  Surgeon: Danie Binder, MD;  [facility];  Service: Endoscopy;  Laterality: N/A;  8:30 AM  . TOTAL KNEE ARTHROPLASTY Right 03/30/2016   Procedure: TOTAL KNEE ARTHROPLASTY;  Surgeon: Carole Civil, MD;  Location: [facility] ORS;  Service: Orthopedics;  Laterality: Right;    Social History   Socioeconomic History  . Marital status: Divorced    Spouse name: Not on file  . Number of children: Not on file  . Years of education: Not on file  . Highest education level: Not on file  Occupational History  . Not on file  Tobacco Use  . Smoking status: Current Some Day Smoker    Packs/day: 0.25    Years: 3.00    Pack years: 0.75    Types: Cigarettes  . Smokeless tobacco: Never Used  Substance and Sexual Activity  . Alcohol use: No  . Drug use: No  . Sexual activity: Not Currently    Birth  control/protection: Post-menopausal  Other Topics Concern  . Not on file  Social History Narrative  . Not on file   Social Determinants of Health   Financial Resource Strain:   . Difficulty of Paying Living Expenses:   Food Insecurity:   . Worried About Charity fundraiser in the Last Year:   . Arboriculturist in the Last Year:   Transportation Needs:   . Film/video editor (Medical):   Marland Kitchen Lack of Transportation (Non-Medical):   Physical Activity:   . Days of Exercise per Week:   . Minutes of Exercise per Session:   Stress:   . Feeling of Stress :   Social Connections:   . Frequency of Communication with Friends and Family:   . Frequency of Social Gatherings with Friends and Family:   . Attends Religious Services:   . Active Member of Clubs or Organizations:   . Attends Archivist Meetings:   Marland Kitchen Marital Status:   Intimate Partner Violence:   . Fear of Current or Ex-Partner:   . Emotionally Abused:   Marland Kitchen Physically Abused:   . Sexually Abused:    Family History  Problem Relation Age of Onset  . Cancer Mother   . Arthritis Mother   . Hypotension Father   . Cancer Sister   . Hypotension  Sister   . Colon cancer Neg Hx       VITAL SIGNS BP 124/65   Pulse 88   Temp (!) 96.1 F (35.6 C) (Oral)   Resp 20   Ht 5\' 5"  (1.651 m)   Wt 187 lb (84.8 kg)   SpO2 95%   BMI 31.12 kg/m   Outpatient Encounter Medications as of 12/18/2019  Medication Sig  . acetaminophen (TYLENOL) 325 MG tablet Take 650 mg by mouth 3 (three) times daily.   12/20/2019 aspirin EC 81 MG tablet Take 81 mg by mouth daily.  . calcium citrate (CALCITRATE - DOSED IN MG ELEMENTAL CALCIUM) 950 MG tablet Take 200 mg of elemental calcium by mouth daily.  . diclofenac Sodium (VOLTAREN) 1 % GEL Apply 4 g topically 3 (three) times daily.   . iron polysaccharides (FERREX 150) 150 MG capsule Take 1 capsule by mouth daily.  Marland Kitchen latanoprost (XALATAN) 0.005 % ophthalmic solution Place 1 drop into both eyes at  bedtime. wait 5 minutes between multiple eye drops  . naproxen (NAPROSYN) 500 MG tablet Take 1 tablet by mouth every 12 (twelve) hours as needed. for arthritis at the knee  . NON FORMULARY Diet: NAS,  . oxybutynin (DITROPAN-XL) 10 MG 24 hr tablet Take 10 mg by mouth daily.  . pantoprazole (PROTONIX) 40 MG tablet Take 40 mg by mouth daily.  . Potassium Chloride ER 20 MEQ TBCR Take 20 mEq by mouth daily at 6 (six) AM.  . vitamin C (ASCORBIC ACID) 250 MG tablet Take 500 mg by mouth daily.  Marland Kitchen zinc oxide 20 % ointment Apply 1 application topically in the morning, at noon, and at bedtime. Apply to sacrum and bilateral buttocks every shift   No facility-administered encounter medications on file as of 12/18/2019.     SIGNIFICANT DIAGNOSTIC EXAMS  TODAY  09-03-19: chest x-ray: No acute abnormality.   09-03-19: ct of abdomen and pelvis:  1. Wall thickening of the distal esophagus, suspicious foresophagitis or reflux. 2. Slight heterogeneous nephrograms of both kidneys, may representpyelonephritis in the setting of recent urinary tract infection. No focal fluid collection. 3. No CT findings of acute cholecystitis. 4. Trabecular coarsening of the left inferior pubic ramus and ischium, may be sequela of prior trauma or Paget's disease. Aortic Atherosclerosis   09-04-19: right ankle x-ray: 1. No fracture, bone lesion or ankle joint abnormality.2. No evidence of osteomyelitis.  NO NEW EXAMS.   LABS REVIEWED PREVIOUS;   09-03-19: wbc 11.9; hgb 13.6; hct 43.6; mcv 83.8 plt 430; glucose 161; bun 28; creat 0.95 ;k+ 4.1; na++ 127; ca 9.1 liver normal albumin 3.9 urine culture: e-coli ESBL and 50,000 proteus mirabilis 09-08-19: wbc 6.1 hgb 10.6; hct 33.7; mcv 86.4 plt 389; glucose 100; bun 13; creat 0.54; k+ 4.2; na++ 134; ca 8.3 09-24-19: wbc 5.0; hgb 11.4; hct 37.2; mcv 88.2 plt 441; glucose 137; bun 19; creat 0.59; k+ 4.3; na++ 136; ca 8.7  NO NEW LABS.   Review of Systems  Unable to perform ROS:  Dementia (unable to participate )    Physical Exam Constitutional:      General: She is not in acute distress.    Appearance: She is well-developed. She is not diaphoretic.  Neck:     Thyroid: No thyromegaly.  Cardiovascular:     Rate and Rhythm: Normal rate and regular rhythm.     Pulses: Normal pulses.     Heart sounds: Normal heart sounds.  Pulmonary:     Effort: Pulmonary effort  is normal. No respiratory distress.     Breath sounds: Normal breath sounds.  Abdominal:     General: Bowel sounds are normal. There is no distension.     Palpations: Abdomen is soft.     Tenderness: There is no abdominal tenderness.  Musculoskeletal:     Cervical back: Neck supple.     Right lower leg: No edema.     Left lower leg: No edema.     Comments:  Is able to move all extremities  History of right knee replacement          Lymphadenopathy:     Cervical: No cervical adenopathy.  Skin:    General: Skin is warm and dry.  Neurological:     Mental Status: She is alert. Mental status is at baseline.  Psychiatric:        Mood and Affect: Mood normal.       ASSESSMENT/ PLAN:  TODAY  1. Dementia without behavioral disturbance unspecified dementia type 2. Essential hypertension 3. Chronic anemia  Will continue current medications Will continue current plan of care Will continue to monitor her status.      MD is aware of resident's narcotic use and is in agreement with current plan of care. We will attempt to wean resident as appropriate.  Synthia Innocent NP [facility]  Contact [phone] Monday through Friday 8am- 5pm  After hours call [phone]

## 2020-01-06 ENCOUNTER — Non-Acute Institutional Stay (SKILLED_NURSING_FACILITY): Payer: Medicare HMO | Admitting: Adult Health

## 2020-01-06 ENCOUNTER — Encounter: Payer: Self-pay | Admitting: Adult Health

## 2020-01-06 DIAGNOSIS — I10 Essential (primary) hypertension: Secondary | ICD-10-CM

## 2020-01-06 DIAGNOSIS — H40053 Ocular hypertension, bilateral: Secondary | ICD-10-CM | POA: Diagnosis not present

## 2020-01-06 DIAGNOSIS — M1711 Unilateral primary osteoarthritis, right knee: Secondary | ICD-10-CM

## 2020-01-06 NOTE — Progress Notes (Signed)
Location:    [place] Room Number: 105/D Place of Service:  SNF (31)   CODE STATUS: Full Code  No Known Allergies  Chief Complaint  Patient presents with  . Medical Management of Chronic Issues        Increased  intraocular pressure bilateral:  Primary osteoarthritis right knee:   essential hypertension:     HPI:  She is a 85 year old long term resident of this facility being seen for the management of her chronic illnesses: glaucoma; osteoarthritis; hypertension. There are no reports of uncontrolled pain; no changes in appetite; no reports of anxiety or agitation.   Past Medical History:  Diagnosis Date  . Anemia   . Diabetes mellitus without complication ([place])   . Glaucoma   . Hypercholesteremia   . Hypertension   . Hypokalemia   . Paraplegia ([place])   . Status post total right knee replacement     Past Surgical History:  Procedure Laterality Date  . ABDOMINAL HYSTERECTOMY    . CATARACT EXTRACTION    . COLONOSCOPY N/A 12/10/2013   Procedure: COLONOSCOPY;  Surgeon: Danie Binder, MD;  Location[facility];  Service: Endoscopy;  Laterality: N/A;  8:30 AM  . TOTAL KNEE ARTHROPLASTY Right 03/30/2016   Procedure: TOTAL KNEE ARTHROPLASTY;  Surgeon: Carole Civil, MD;  Location: [facility]S;  Service: Orthopedics;  Laterality: Right;    Social History   Socioeconomic History  . Marital status: Divorced    Spouse name: Not on file  . Number of children: Not on file  . Years of education: Not on file  . Highest education level: Not on file  Occupational History  . Not on file  Tobacco Use  . Smoking status: Current Some Day Smoker    Packs/day: 0.25    Years: 3.00    Pack years: 0.75    Types: Cigarettes  . Smokeless tobacco: Never Used  Vaping Use  . Vaping Use: Unknown  Substance and Sexual Activity  . Alcohol use: No  . Drug use: No  . Sexual activity: Not Currently    Birth control/protection: Post-menopausal  Other Topics Concern   . Not on file  Social History Narrative  . Not on file   Social Determinants of Health   Financial Resource Strain:   . Difficulty of Paying Living Expenses:   Food Insecurity:   . Worried About Charity fundraiser in the Last Year:   . Arboriculturist in the Last Year:   Transportation Needs:   . Film/video editor (Medical):   Marland Kitchen Lack of Transportation (Non-Medical):   Physical Activity:   . Days of Exercise per Week:   . Minutes of Exercise per Session:   Stress:   . Feeling of Stress :   Social Connections:   . Frequency of Communication with Friends and Family:   . Frequency of Social Gatherings with Friends and Family:   . Attends Religious Services:   . Active Member of Clubs or Organizations:   . Attends Archivist Meetings:   Marland Kitchen Marital Status:   Intimate Partner Violence:   . Fear of Current or Ex-Partner:   . Emotionally Abused:   Marland Kitchen Physically Abused:   . Sexually Abused:    Family History  Problem Relation Age of Onset  . Cancer Mother   . Arthritis Mother   . Hypotension Father   . Cancer Sister   . Hypotension Sister   .  Colon cancer Neg Hx       VITAL SIGNS BP 119/77   Pulse 79   Temp (!) 96.5 F (35.8 C) (Oral)   Resp 20   Ht 5\' 5"  (1.651 m)   Wt 186 lb 9.6 oz (84.6 kg)   SpO2 95%   BMI 31.05 kg/m   Outpatient Encounter Medications as of 01/06/2020  Medication Sig  . acetaminophen (TYLENOL) 325 MG tablet Take 650 mg by mouth 3 (three) times daily.   01/08/2020 aspirin EC 81 MG tablet Take 81 mg by mouth daily.  . calcium citrate (CALCITRATE - DOSED IN MG ELEMENTAL CALCIUM) 950 MG tablet Take 200 mg of elemental calcium by mouth daily.  . diclofenac Sodium (VOLTAREN) 1 % GEL Apply 4 g topically 3 (three) times daily.   . iron polysaccharides (FERREX 150) 150 MG capsule Take 1 capsule by mouth daily.  Marland Kitchen latanoprost (XALATAN) 0.005 % ophthalmic solution Place 1 drop into both eyes at bedtime. wait 5 minutes between multiple eye drops  .  naproxen (NAPROSYN) 500 MG tablet Take 1 tablet by mouth every 12 (twelve) hours as needed. for arthritis at the knee  . NON FORMULARY Diet: NAS,  . oxybutynin (DITROPAN-XL) 10 MG 24 hr tablet Take 10 mg by mouth daily.  . pantoprazole (PROTONIX) 40 MG tablet Take 40 mg by mouth daily.  . Potassium Chloride ER 20 MEQ TBCR Take 20 mEq by mouth daily at 6 (six) AM.  . vitamin C (ASCORBIC ACID) 250 MG tablet Take 500 mg by mouth daily.  Marland Kitchen zinc oxide 20 % ointment Apply 1 application topically in the morning, at noon, and at bedtime. Apply to sacrum and bilateral buttocks every shift   No facility-administered encounter medications on file as of 01/06/2020.     SIGNIFICANT DIAGNOSTIC EXAMS   PREVIOUS  09-03-19: chest x-ray: No acute abnormality.   09-03-19: ct of abdomen and pelvis:  1. Wall thickening of the distal esophagus, suspicious foresophagitis or reflux. 2. Slight heterogeneous nephrograms of both kidneys, may representpyelonephritis in the setting of recent urinary tract infection. No focal fluid collection. 3. No CT findings of acute cholecystitis. 4. Trabecular coarsening of the left inferior pubic ramus and ischium, may be sequela of prior trauma or Paget's disease. Aortic Atherosclerosis   09-04-19: right ankle x-ray: 1. No fracture, bone lesion or ankle joint abnormality.2. No evidence of osteomyelitis.  NO NEW EXAMS.   LABS REVIEWED PREVIOUS;   09-03-19: wbc 11.9; hgb 13.6; hct 43.6; mcv 83.8 plt 430; glucose 161; bun 28; creat 0.95 ;k+ 4.1; na++ 127; ca 9.1 liver normal albumin 3.9 urine culture: e-coli ESBL and 50,000 proteus mirabilis 09-08-19: wbc 6.1 hgb 10.6; hct 33.7; mcv 86.4 plt 389; glucose 100; bun 13; creat 0.54; k+ 4.2; na++ 134; ca 8.3 09-24-19: wbc 5.0; hgb 11.4; hct 37.2; mcv 88.2 plt 441; glucose 137; bun 19; creat 0.59; k+ 4.3; na++ 136; ca 8.7  NO NEW LABS.   Review of Systems  Unable to perform ROS: Dementia (unable to participate )   Physical  Exam Constitutional:      General: She is not in acute distress.    Appearance: She is well-developed. She is obese. She is not diaphoretic.  Neck:     Thyroid: No thyromegaly.  Cardiovascular:     Rate and Rhythm: Normal rate and regular rhythm.     Pulses: Normal pulses.     Heart sounds: Normal heart sounds.  Pulmonary:     Effort: Pulmonary  effort is normal. No respiratory distress.     Breath sounds: Normal breath sounds.  Abdominal:     General: Bowel sounds are normal. There is no distension.     Palpations: Abdomen is soft.     Tenderness: There is no abdominal tenderness.  Musculoskeletal:     Cervical back: Neck supple.     Right lower leg: No edema.     Left lower leg: No edema.     Comments:  Is able to move all extremities  History of right knee replacement           Lymphadenopathy:     Cervical: No cervical adenopathy.  Skin:    General: Skin is warm and dry.  Neurological:     Mental Status: She is alert. Mental status is at baseline.  Psychiatric:        Mood and Affect: Mood normal.      ASSESSMENT/ PLAN:  TODAY;   1. Increased  intraocular pressure bilateral: is stable will continue xalatan to both eyes  2. Primary osteoarthritis right knee: is stable will continue tylenol 650 mg three times daily voltaren gel 4 gm to right knee three times daily   3. essential hypertension: Is stable b/p 119/77 will continue asa 81 mg daily   PREVIOUS  4. Chronic anemia: is stable hgb 11.4 will continue ferrex daily   5. GERD without esophagitis: is stable will continue protonix 40 mg daily   6. Urinary urgency: is stable will continue ditropan xl 10 mg daily   7. Hypokalemia: is stable k+ 4.2 will continue k+ 20 meq daily    MD is aware of resident's narcotic use and is in agreement with current plan of care. We will attempt to wean resident as appropriate.  Synthia Innocent NP [facility]  Contact [phone] Monday through Friday 8am- 5pm   After hours call [phone]

## 2020-02-03 ENCOUNTER — Encounter: Payer: Self-pay | Admitting: Adult Health

## 2020-02-03 ENCOUNTER — Non-Acute Institutional Stay (SKILLED_NURSING_FACILITY): Payer: Medicare HMO | Admitting: Adult Health

## 2020-02-03 DIAGNOSIS — R3915 Urgency of urination: Secondary | ICD-10-CM

## 2020-02-03 DIAGNOSIS — K219 Gastro-esophageal reflux disease without esophagitis: Secondary | ICD-10-CM

## 2020-02-03 DIAGNOSIS — D649 Anemia, unspecified: Secondary | ICD-10-CM | POA: Diagnosis not present

## 2020-02-03 NOTE — Progress Notes (Signed)
Location:    [facility] Room Number: 105/D Place of Service:  SNF (31)   CODE STATUS: Full Code  No Known Allergies  Chief Complaint  Patient presents with  . Medical Management of Chronic Issues         Chronic anemia:  GERD without esophagitis    Urinary urgency:    HPI:  She is a 85 year old long term resident of this facility being seen for the management of her chronic illnesses: anemia; gerd; urgency. There are no reports of uncontrolled pain; no heart burn; no constipation; no anxiety.   Past Medical History:  Diagnosis Date  . Anemia   . Diabetes mellitus without complication (HCC)   . Glaucoma   . Hypercholesteremia   . Hypertension   . Hypokalemia   . Paraplegia (HCC)   . Status post total right knee replacement     Past Surgical History:  Procedure Laterality Date  . ABDOMINAL HYSTERECTOMY    . CATARACT EXTRACTION    . COLONOSCOPY N/A 12/10/2013   Procedure: COLONOSCOPY;  Surgeon: West Bali, MD;  Location: [facility];  Service: Endoscopy;  Laterality: N/A;  8:30 AM  . TOTAL KNEE ARTHROPLASTY Right 03/30/2016   Procedure: TOTAL KNEE ARTHROPLASTY;  Surgeon: Vickki Hearing, MD;  Location: [facility] ORS;  Service: Orthopedics;  Laterality: Right;    Social History   Socioeconomic History  . Marital status: Divorced    Spouse name: Not on file  . Number of children: Not on file  . Years of education: Not on file  . Highest education level: Not on file  Occupational History  . Not on file  Tobacco Use  . Smoking status: Current Some Day Smoker    Packs/day: 0.25    Years: 3.00    Pack years: 0.75    Types: Cigarettes  . Smokeless tobacco: Never Used  Vaping Use  . Vaping Use: Unknown  Substance and Sexual Activity  . Alcohol use: No  . Drug use: No  . Sexual activity: Not Currently    Birth control/protection: Post-menopausal  Other Topics Concern  . Not on file  Social History Narrative  . Not on file   Social  Determinants of Health   Financial Resource Strain:   . Difficulty of Paying Living Expenses:   Food Insecurity:   . Worried About Programme researcher, broadcasting/film/video in the Last Year:   . Barista in the Last Year:   Transportation Needs:   . Freight forwarder (Medical):   Marland Kitchen Lack of Transportation (Non-Medical):   Physical Activity:   . Days of Exercise per Week:   . Minutes of Exercise per Session:   Stress:   . Feeling of Stress :   Social Connections:   . Frequency of Communication with Friends and Family:   . Frequency of Social Gatherings with Friends and Family:   . Attends Religious Services:   . Active Member of Clubs or Organizations:   . Attends Banker Meetings:   Marland Kitchen Marital Status:   Intimate Partner Violence:   . Fear of Current or Ex-Partner:   . Emotionally Abused:   Marland Kitchen Physically Abused:   . Sexually Abused:    Family History  Problem Relation Age of Onset  . Cancer Mother   . Arthritis Mother   . Hypotension Father   . Cancer Sister   . Hypotension Sister   . Colon cancer Neg Hx  VITAL SIGNS BP 135/79   Pulse 90   Temp (!) 97.1 F (36.2 C) (Oral)   Ht 5\' 5"  (1.651 m)   Wt 189 lb 6.4 oz (85.9 kg)   BMI 31.52 kg/m   Outpatient Encounter Medications as of 02/03/2020  Medication Sig  . acetaminophen (TYLENOL) 325 MG tablet Take 650 mg by mouth 3 (three) times daily.   02/05/2020 aspirin EC 81 MG tablet Take 81 mg by mouth daily.  . calcium citrate (CALCITRATE - DOSED IN MG ELEMENTAL CALCIUM) 950 MG tablet Take 200 mg of elemental calcium by mouth daily.  . diclofenac Sodium (VOLTAREN) 1 % GEL Apply 4 g topically 3 (three) times daily.   . iron polysaccharides (FERREX 150) 150 MG capsule Take 1 capsule by mouth daily.  Marland Kitchen latanoprost (XALATAN) 0.005 % ophthalmic solution Place 1 drop into both eyes at bedtime. wait 5 minutes between multiple eye drops  . naproxen (NAPROSYN) 500 MG tablet Take 1 tablet by mouth every 12 (twelve) hours as needed.  for arthritis at the knee  . NON FORMULARY Diet: NAS,  . oxybutynin (DITROPAN-XL) 10 MG 24 hr tablet Take 10 mg by mouth daily.  . pantoprazole (PROTONIX) 40 MG tablet Take 40 mg by mouth daily.  . Potassium Chloride ER 20 MEQ TBCR Take 20 mEq by mouth daily at 6 (six) AM.  . vitamin C (ASCORBIC ACID) 250 MG tablet Take 500 mg by mouth daily.  Marland Kitchen zinc oxide 20 % ointment Apply 1 application topically in the morning, at noon, and at bedtime. Apply to sacrum and bilateral buttocks every shift   No facility-administered encounter medications on file as of 02/03/2020.     SIGNIFICANT DIAGNOSTIC EXAMS   PREVIOUS  09-03-19: chest x-ray: No acute abnormality.   09-03-19: ct of abdomen and pelvis:  1. Wall thickening of the distal esophagus, suspicious foresophagitis or reflux. 2. Slight heterogeneous nephrograms of both kidneys, may representpyelonephritis in the setting of recent urinary tract infection. No focal fluid collection. 3. No CT findings of acute cholecystitis. 4. Trabecular coarsening of the left inferior pubic ramus and ischium, may be sequela of prior trauma or Paget's disease. Aortic Atherosclerosis   09-04-19: right ankle x-ray: 1. No fracture, bone lesion or ankle joint abnormality.2. No evidence of osteomyelitis.  NO NEW EXAMS.   LABS REVIEWED PREVIOUS;   09-03-19: wbc 11.9; hgb 13.6; hct 43.6; mcv 83.8 plt 430; glucose 161; bun 28; creat 0.95 ;k+ 4.1; na++ 127; ca 9.1 liver normal albumin 3.9 urine culture: e-coli ESBL and 50,000 proteus mirabilis 09-08-19: wbc 6.1 hgb 10.6; hct 33.7; mcv 86.4 plt 389; glucose 100; bun 13; creat 0.54; k+ 4.2; na++ 134; ca 8.3 09-24-19: wbc 5.0; hgb 11.4; hct 37.2; mcv 88.2 plt 441; glucose 137; bun 19; creat 0.59; k+ 4.3; na++ 136; ca 8.7  NO NEW LABS.   Review of Systems  Unable to perform ROS: Dementia (unable to participate )    Physical Exam Constitutional:      General: She is not in acute distress.    Appearance: She is  well-developed. She is obese. She is not diaphoretic.  Neck:     Thyroid: No thyromegaly.  Cardiovascular:     Rate and Rhythm: Normal rate and regular rhythm.     Pulses: Normal pulses.     Heart sounds: Normal heart sounds.  Pulmonary:     Effort: Pulmonary effort is normal. No respiratory distress.     Breath sounds: Normal breath sounds.  Abdominal:  General: Bowel sounds are normal. There is no distension.     Palpations: Abdomen is soft.     Tenderness: There is no abdominal tenderness.  Musculoskeletal:     Cervical back: Neck supple.     Right lower leg: No edema.     Left lower leg: No edema.     Comments: Is able to move all extremities  History of right knee replacement            Lymphadenopathy:     Cervical: No cervical adenopathy.  Skin:    General: Skin is warm and dry.  Neurological:     Mental Status: She is alert. Mental status is at baseline.  Psychiatric:        Mood and Affect: Mood normal.      ASSESSMENT/ PLAN:  TODAY;   1. Chronic anemia: is stable hgb 11.4 will continue ferrex daily   2. GERD without esophagitis Is stable will continue protonix 40 mg daily   3. Urinary urgency: is stable will continue ditropan xl 10 mg daily   PREVIOUS  4. Hypokalemia: is stable k+ 4.2 will continue k+ 20 meq daily   5. Increased  intraocular pressure bilateral: is stable will continue xalatan to both eyes  6. Primary osteoarthritis right knee: is stable will continue tylenol 650 mg three times daily voltaren gel 4 gm to right knee three times daily   7. essential hypertension: Is stable b/p 135/79 will continue asa 81 mg daily        MD is aware of resident's narcotic use and is in agreement with current plan of care. We will attempt to wean resident as appropriate.  Synthia Innocent NP [facility]  Contact [phone] Monday through Friday 8am- 5pm  After hours call [phone]

## 2020-02-17 ENCOUNTER — Encounter: Payer: Self-pay | Admitting: Adult Health

## 2020-02-17 ENCOUNTER — Non-Acute Institutional Stay (SKILLED_NURSING_FACILITY): Payer: Medicare HMO | Admitting: Adult Health

## 2020-02-17 DIAGNOSIS — Z0000 Encounter for general adult medical examination without abnormal findings: Secondary | ICD-10-CM | POA: Diagnosis not present

## 2020-02-17 NOTE — Progress Notes (Signed)
Subjective:   Anna Hanna is a 85 y.o. female who presents for Medicare Annual (Subsequent) preventive examination. She is a long term resident of [facility]   Review of Systems    Review of Systems  Unable to perform ROS: Dementia (unabl to fully participate )    Cardiac Risk Factors include: advanced age (>63men, >53 women);obesity (BMI >30kg/m2);sedentary lifestyle     Objective:    Today's Vitals   02/17/20 0850 02/17/20 1050  BP: (!) 138/64   Pulse: 84   Temp: 97.9 F (36.6 C)   TempSrc: Oral   Weight: 189 lb 6.4 oz (85.9 kg)   Height: 5\' 5"  (1.651 m)   PainSc:  0-No pain   Body mass index is 31.52 kg/m.  Advanced Directives 02/17/2020 02/03/2020 01/06/2020 12/18/2019 12/15/2019 10/06/2019 09/29/2019  Does Patient Have a Medical Advance Directive? Yes Yes Yes Yes Yes Yes Yes  Type of Advance Directive (No Data) (No Data) (No Data) (No Data) (No Data) (No Data) (No Data)  Does patient want to make changes to medical advance directive? No - Patient declined No - Patient declined No - Patient declined No - Patient declined No - Patient declined No - Patient declined No - Patient declined  Copy of Healthcare Power of Attorney in Chart? - - - - - - -  Would patient like information on creating a medical advance directive? - - - - - - -  Pre-existing out of facility DNR order (yellow form or pink MOST form) - - - - - - -    Current Medications (verified) Outpatient Encounter Medications as of 02/17/2020  Medication Sig   acetaminophen (TYLENOL) 325 MG tablet Take 650 mg by mouth 3 (three) times daily.    aspirin EC 81 MG tablet Take 81 mg by mouth daily.   calcium citrate (CALCITRATE - DOSED IN MG ELEMENTAL CALCIUM) 950 MG tablet Take 200 mg of elemental calcium by mouth daily.   diclofenac Sodium (VOLTAREN) 1 % GEL Apply 4 g topically 3 (three) times daily.    iron polysaccharides (FERREX 150) 150 MG capsule Take 1 capsule by mouth daily.   latanoprost (XALATAN) 0.005 %  ophthalmic solution Place 1 drop into both eyes at bedtime. wait 5 minutes between multiple eye drops   naproxen (NAPROSYN) 500 MG tablet Take 1 tablet by mouth every 12 (twelve) hours as needed. for arthritis at the knee   NON FORMULARY Diet: NAS,   oxybutynin (DITROPAN-XL) 10 MG 24 hr tablet Take 10 mg by mouth daily.   pantoprazole (PROTONIX) 40 MG tablet Take 40 mg by mouth daily.   Potassium Chloride ER 20 MEQ TBCR Take 20 mEq by mouth daily at 6 (six) AM.   vitamin C (ASCORBIC ACID) 250 MG tablet Take 500 mg by mouth daily.   zinc oxide 20 % ointment Apply 1 application topically in the morning, at noon, and at bedtime. Apply to sacrum and bilateral buttocks every shift   No facility-administered encounter medications on file as of 02/17/2020.    Allergies (verified) Patient has no known allergies.   History: Past Medical History:  Diagnosis Date   Anemia    Diabetes mellitus without complication (HCC)    Glaucoma    Hypercholesteremia    Hypertension    Hypokalemia    Paraplegia (HCC)    Status post total right knee replacement    Past Surgical History:  Procedure Laterality Date   ABDOMINAL HYSTERECTOMY     CATARACT EXTRACTION  COLONOSCOPY N/A 12/10/2013   Procedure: COLONOSCOPY;  Surgeon: West BaliSandi L Fields, MD;  [facility]: [facility];  Service: Endoscopy;  Laterality: N/A;  8:30 AM   TOTAL KNEE ARTHROPLASTY Right 03/30/2016   Procedure: TOTAL KNEE ARTHROPLASTY;  Surgeon: Vickki HearingStanley E Harrison, MD;  Location: [facility];  Service: Orthopedics;  Laterality: Right;   Family History  Problem Relation Age of Onset   Cancer Mother    Arthritis Mother    Hypotension Father    Cancer Sister    Hypotension Sister    Colon cancer Neg Hx    Social History   Socioeconomic History   Marital status: Divorced    Spouse name: Not on file   Number of children: Not on file   Years of education: Not on file   Highest education level: Not on file    Occupational History   Occupation: retired   Tobacco Use   Smoking status: Current Some Day Smoker    Packs/day: 0.25    Years: 3.00    Pack years: 0.75    Types: Cigarettes   Smokeless tobacco: Never Used  Building services engineerVaping Use   Vaping Use: Never used  Substance and Sexual Activity   Alcohol use: No   Drug use: No   Sexual activity: Not Currently    Birth control/protection: Post-menopausal  Other Topics Concern   Not on file  Social History Narrative   [facility]    Social Determinants of Health   Financial Resource Strain:    Difficulty of Paying Living Expenses:   Food Insecurity:    Worried About Programme researcher, broadcasting/film/videounning Out of Food in the Last Year:    Baristaan Out of Food in the Last Year:   Transportation Needs:    Freight forwarderLack of Transportation (Medical):    Lack of Transportation (Non-Medical):   Physical Activity: Inactive   Days of Exercise per Week: 0 days   Minutes of Exercise per Session: 0 min  Stress:    Feeling of Stress :   Social Connections:    Frequency of Communication with Friends and Family:    Frequency of Social Gatherings with Friends and Family:    Attends Religious Services:    Active Member of Clubs or Organizations:    Attends Engineer, structuralClub or Organization Meetings:    Marital Status:     Tobacco Counseling Ready to quit: Not Answered Counseling given: Not Answered   Clinical Intake:  Pre-visit preparation completed: Yes  Pain : No/denies pain Pain Score: 0-No pain Faces Pain Scale: No hurt  Faces Pain Scale: No hurt  BMI - recorded: 31.52 Nutritional Status: BMI > 30  Obese Diabetes: No     Diabetic?no  Interpreter Needed?: No      Activities of Daily Living In your present state of health, do you have any difficulty performing the following activities: 02/17/2020 09/03/2019  Hearing? N Y  Vision? N N  Difficulty concentrating or making decisions? Y N  Walking or climbing stairs? Y Y  Dressing or bathing? Y Y  Doing  errands, shopping? Y -  Quarry managerreparing Food and eating ? Y -  Using the Toilet? Y -  In the past six months, have you accidently leaked urine? Y -  Do you have problems with loss of bowel control? Y -  Managing your Medications? Y -  Managing your Finances? Y -  Housekeeping or managing your Housekeeping? Y -  Some recent data might be hidden    Patient Care Team: Chilton SiGreen,  Chong Sicilian, NP as PCP - [facility] (Geriatric Medicine) [facility]  Indicate any recent Medical Services you may have received from other than [facility] providers in the past year (date may be approximate).     Assessment:   This is a routine wellness examination for Ilynn. She is a long term resident of [facility]   Hearing/Vision screen No exam data present  Dietary issues and exercise activities discussed: Current Exercise Habits: The patient does not participate in regular exercise at present  Goals     DIET - INCREASE WATER INTAKE     Follow up with Provider as scheduled     General - Client will not be readmitted within 30 days ([facility])      Depression Screen PHQ 2/9 Scores 02/17/2020  PHQ - 2 Score 0    Fall Risk Fall Risk  02/17/2020  Falls in the past year? 0  Risk for fall due to : Impaired balance/gait;Impaired mobility    Any stairs in or around the home? no If so, are there any without handrails? N/a  Home free of loose throw rugs in walkways, pet beds, electrical cords, etc? no Adequate lighting in your home to reduce risk of falls? Yes   ASSISTIVE DEVICES UTILIZED TO PREVENT FALLS:  Life alert? n/a Use of a cane, walker or w/c? yes Grab bars in the bathroom? yes Shower chair or bench in shower? yes Elevated toilet seat or a handicapped toilet? Yes   TIMED UP AND GO:  Was the test performed? no  Length of time to ambulate non-ambulatory     Cognitive Function:     6CIT Screen 02/17/2020  What Year? 4 points    Immunizations Immunization History    Administered Date(s) Administered   Moderna SARS-COVID-2 Vaccination 08/03/2019, 08/31/2019   PPD Test 10/03/2016    Qualifies for Shingles Vaccine? Patient long term resident of SNF    Screening Tests Health Maintenance  Topic Date Due   TETANUS/TDAP  Never done   DEXA SCAN  Never done   PNA vac Low Risk Adult (1 of 2 - PCV13) Never done   INFLUENZA VACCINE  02/22/2020   COVID-19 Vaccine  Completed    Health Maintenance  Health Maintenance Due  Topic Date Due   TETANUS/TDAP  Never done   DEXA SCAN  Never done   PNA vac Low Risk Adult (1 of 2 - PCV13) Never done     Lung Cancer Screening: (Low Dose CT Chest recommended if Age 50-80 years, 30 pack-year currently smoking OR have quit w/in 15years.) does not  qualify.   Lung Cancer Screening Referral: n/a  Additional Screening:  Hepatitis C Screening: n/a   Vision Screening: Recommended annual ophthalmology exams for early detection of glaucoma and other disorders of the eye. Is the patient up to date with their annual eye exam?  yes  Who is the provider or what is the name of the office in which the patient attends annual eye exams? At SNF  .   Dental Screening: Recommended annual dental exams for proper oral hygiene  Community Resource Referral / Chronic Care Management: CRR required this visit?  none   CCM required this visit?  None      Plan:     I have personally reviewed and noted the following in the patients chart:    Medical and social history  Use of alcohol, tobacco or illicit drugs   Current medications and supplements  Functional ability and  status  Nutritional status  Physical activity  Advanced directives  List of other physicians  Hospitalizations, surgeries, and ER visits in previous 12 months  Vitals  Screenings to include cognitive, depression, and falls  Referrals and appointments  In addition, I have reviewed and discussed with patient certain preventive  protocols, quality metrics, and best practice recommendations. A written personalized care plan for preventive services as well as general preventive health recommendations were provided to patient.     Sharee Holster, NP   02/17/2020

## 2020-02-17 NOTE — Patient Instructions (Signed)
  Anna Hanna , Thank you for taking time to come for your Medicare Wellness Visit. I appreciate your ongoing commitment to your health goals. Please review the following plan we discussed and let me know if I can assist you in the future.   These are the goals we discussed: Goals    . DIET - INCREASE WATER INTAKE    . Follow up with Provider as scheduled    . General - Client will not be readmitted within 30 days ([facility])       This is a list of the screening recommended for you and due dates:  Health Maintenance  Topic Date Due  . Tetanus Vaccine  Never done  . DEXA scan (bone density measurement)  Never done  . Pneumonia vaccines (1 of 2 - PCV13) Never done  . Flu Shot  02/22/2020  . COVID-19 Vaccine  Completed

## 2020-02-19 ENCOUNTER — Other Ambulatory Visit (HOSPITAL_COMMUNITY)
Admission: RE | Admit: 2020-02-19 | Discharge: 2020-02-19 | Disposition: A | Discharge status: Home / Self Care | Payer: Medicare HMO | Source: Skilled Nursing Facility | Attending: Adult Health | Admitting: Adult Health

## 2020-02-19 DIAGNOSIS — N10 Acute tubulo-interstitial nephritis: Secondary | ICD-10-CM | POA: Diagnosis present

## 2020-02-19 LAB — BASIC METABOLIC PANEL
Anion gap: 10 (ref 5–15)
BUN: 23 mg/dL (ref 8–23)
CO2: 22 mmol/L (ref 22–32)
Calcium: 8.9 mg/dL (ref 8.9–10.3)
Chloride: 105 mmol/L (ref 98–111)
Creatinine, Ser: 0.54 mg/dL (ref 0.44–1.00)
GFR calc Af Amer: >60 mL/min (ref >60)
GFR calc non Af Amer: >60 mL/min (ref >60)
Glucose, Bld: 89 mg/dL (ref 70–99)
Potassium: 4.1 mmol/L (ref 3.5–5.1)
Sodium: 137 mmol/L (ref 135–145)

## 2020-03-04 ENCOUNTER — Non-Acute Institutional Stay (SKILLED_NURSING_FACILITY): Payer: Medicare HMO | Admitting: Adult Health

## 2020-03-04 ENCOUNTER — Encounter: Payer: Self-pay | Admitting: Adult Health

## 2020-03-04 DIAGNOSIS — E876 Hypokalemia: Secondary | ICD-10-CM | POA: Diagnosis not present

## 2020-03-04 DIAGNOSIS — H40053 Ocular hypertension, bilateral: Secondary | ICD-10-CM

## 2020-03-04 DIAGNOSIS — M1711 Unilateral primary osteoarthritis, right knee: Secondary | ICD-10-CM | POA: Diagnosis not present

## 2020-03-04 NOTE — Progress Notes (Signed)
Location:    [facility] Room Number: 105/D Place of Service:  SNF (31)   CODE STATUS: Full Code  No Known Allergies  Chief Complaint  Patient presents with  . Medical Management of Chronic Issues         Hypokalemia:  Increased intraocular pressure bilateral:    Primary osteoarthritis right knee     HPI:  She is a 85 year old long term resident of this facility being seen for the management of her chronic illnesses:Hypokalemia:  Increased intraocular pressure bilateral:primary osteoarthritis right knee . There are no reports of uncontrolled pain; no reports of changes in appetite; no reports of anxiety or agitation.   Past Medical History:  Diagnosis Date  . Anemia   . Diabetes mellitus without complication (HCC)   . Glaucoma   . Hypercholesteremia   . Hypertension   . Hypokalemia   . Paraplegia (HCC)   . Status post total right knee replacement     Past Surgical History:  Procedure Laterality Date  . ABDOMINAL HYSTERECTOMY    . CATARACT EXTRACTION    . COLONOSCOPY N/A 12/10/2013   Procedure: COLONOSCOPY;  Surgeon: West Bali, MD;  [facility]: [facility];  Service: Endoscopy;  Laterality: N/A;  8:30 AM  . TOTAL KNEE ARTHROPLASTY Right 03/30/2016   Procedure: TOTAL KNEE ARTHROPLASTY;  Surgeon: Vickki Hearing, MD;  Location: [facility];  Service: Orthopedics;  Laterality: Right;    Social History   Socioeconomic History  . Marital status: Divorced    Spouse name: Not on file  . Number of children: Not on file  . Years of education: Not on file  . Highest education level: Not on file  Occupational History  . Occupation: retired   Tobacco Use  . Smoking status: Current Some Day Smoker    Packs/day: 0.25    Years: 3.00    Pack years: 0.75    Types: Cigarettes  . Smokeless tobacco: Never Used  Vaping Use  . Vaping Use: Never used  Substance and Sexual Activity  . Alcohol use: No  . Drug use: No  . Sexual activity: Not Currently     Birth control/protection: Post-menopausal  Other Topics Concern  . Not on file  Social History Narrative   Long term resident of [facility]    Social Determinants of Health   Financial Resource Strain:   . Difficulty of Paying Living Expenses:   Food Insecurity:   . Worried About Programme researcher, broadcasting/film/video in the Last Year:   . Barista in the Last Year:   Transportation Needs:   . Freight forwarder (Medical):   Marland Kitchen Lack of Transportation (Non-Medical):   Physical Activity: Inactive  . Days of Exercise per Week: 0 days  . Minutes of Exercise per Session: 0 min  Stress:   . Feeling of Stress :   Social Connections:   . Frequency of Communication with Friends and Family:   . Frequency of Social Gatherings with Friends and Family:   . Attends Religious Services:   . Active Member of Clubs or Organizations:   . Attends Banker Meetings:   Marland Kitchen Marital Status:   Intimate Partner Violence: Not At Risk  . Fear of Current or Ex-Partner: No  . Emotionally Abused: No  . Physically Abused: No  . Sexually Abused: No   Family History  Problem Relation Age of Onset  . Cancer Mother   . Arthritis Mother   .  Hypotension Father   . Cancer Sister   . Hypotension Sister   . Colon cancer Neg Hx       VITAL SIGNS BP 122/78   Pulse 86   Temp 98 F (36.7 C) (Oral)   Ht 5\' 5"  (1.651 m)   Wt 193 lb 3.2 oz (87.6 kg)   BMI 32.15 kg/m   Outpatient Encounter Medications as of 03/04/2020  Medication Sig  . acetaminophen (TYLENOL) 325 MG tablet Take 650 mg by mouth 3 (three) times daily.   05/04/2020 aspirin EC 81 MG tablet Take 81 mg by mouth daily.  . calcium citrate (CALCITRATE - DOSED IN MG ELEMENTAL CALCIUM) 950 MG tablet Take 200 mg of elemental calcium by mouth daily.  . diclofenac Sodium (VOLTAREN) 1 % GEL Apply 4 g topically 3 (three) times daily.   . iron polysaccharides (FERREX 150) 150 MG capsule Take 1 capsule by mouth daily.  Marland Kitchen latanoprost (XALATAN) 0.005 % ophthalmic  solution Place 1 drop into both eyes at bedtime. wait 5 minutes between multiple eye drops  . naproxen (NAPROSYN) 500 MG tablet Take 1 tablet by mouth every 12 (twelve) hours as needed. for arthritis at the knee  . NON FORMULARY Diet: NAS,  . oxybutynin (DITROPAN-XL) 10 MG 24 hr tablet Take 10 mg by mouth daily.  . pantoprazole (PROTONIX) 40 MG tablet Take 40 mg by mouth daily.  . Potassium Chloride ER 20 MEQ TBCR Take 20 mEq by mouth daily at 6 (six) AM.  . vitamin C (ASCORBIC ACID) 250 MG tablet Take 500 mg by mouth daily.  Marland Kitchen zinc oxide 20 % ointment Apply 1 application topically in the morning, at noon, and at bedtime. Apply to sacrum and bilateral buttocks every shift   No facility-administered encounter medications on file as of 03/04/2020.     SIGNIFICANT DIAGNOSTIC EXAMS   PREVIOUS  09-03-19: chest x-ray: No acute abnormality.   09-03-19: ct of abdomen and pelvis:  1. Wall thickening of the distal esophagus, suspicious foresophagitis or reflux. 2. Slight heterogeneous nephrograms of both kidneys, may representpyelonephritis in the setting of recent urinary tract infection. No focal fluid collection. 3. No CT findings of acute cholecystitis. 4. Trabecular coarsening of the left inferior pubic ramus and ischium, may be sequela of prior trauma or Paget's disease. Aortic Atherosclerosis   09-04-19: right ankle x-ray: 1. No fracture, bone lesion or ankle joint abnormality.2. No evidence of osteomyelitis.  NO NEW EXAMS.   LABS REVIEWED PREVIOUS;   09-03-19: wbc 11.9; hgb 13.6; hct 43.6; mcv 83.8 plt 430; glucose 161; bun 28; creat 0.95 ;k+ 4.1; na++ 127; ca 9.1 liver normal albumin 3.9 urine culture: e-coli ESBL and 50,000 proteus mirabilis 09-08-19: wbc 6.1 hgb 10.6; hct 33.7; mcv 86.4 plt 389; glucose 100; bun 13; creat 0.54; k+ 4.2; na++ 134; ca 8.3 09-24-19: wbc 5.0; hgb 11.4; hct 37.2; mcv 88.2 plt 441; glucose 137; bun 19; creat 0.59; k+ 4.3; na++ 136; ca 8.7  NO NEW LABS.    Review of Systems  Unable to perform ROS: Dementia (uanble to participate )    Physical Exam Constitutional:      General: She is not in acute distress.    Appearance: She is well-developed. She is obese. She is not diaphoretic.  Neck:     Thyroid: No thyromegaly.  Cardiovascular:     Rate and Rhythm: Normal rate and regular rhythm.     Pulses: Normal pulses.     Heart sounds: Normal heart sounds.  Pulmonary:     Effort: Pulmonary effort is normal. No respiratory distress.     Breath sounds: Normal breath sounds.  Abdominal:     General: Bowel sounds are normal. There is no distension.     Palpations: Abdomen is soft.     Tenderness: There is no abdominal tenderness.  Musculoskeletal:     Cervical back: Neck supple.     Right lower leg: No edema.     Left lower leg: No edema.     Comments:  Is able to move all extremities  History of right knee replacement   Lymphadenopathy:     Cervical: No cervical adenopathy.  Skin:    General: Skin is warm and dry.  Neurological:     Mental Status: She is alert. Mental status is at baseline.  Psychiatric:        Mood and Affect: Mood normal.     ASSESSMENT/ PLAN:  TODAY;   1. Hypokalemia: is stable k+ 4.3 will stop k+ and repeat level on 03-11-20  2. Increased intraocular pressure bilateral: is stable will continue xalatan to both eyes  3. Primary osteoarthritis right knee is stable will continue tylenol 650 mg three times daily voltaren gel 4 gm right knee three times daily    PREVIOUS  4. essential hypertension: Is stable b/p 122/78 will continue asa 81 mg daily   5. Chronic anemia: is stable hgb 11.4 will continue ferrex daily   6. GERD without esophagitis Is stable will continue protonix 40 mg daily   7. Urinary urgency: is stable will continue ditropan xl 10 mg daily         MD is aware of resident's narcotic use and is in agreement with current plan of care. We will attempt to wean resident as  appropriate.  Synthia Innocent NP [facility]  Contact [phone] Monday through Friday 8am- 5pm  After hours call [phone]

## 2020-03-11 ENCOUNTER — Other Ambulatory Visit (HOSPITAL_COMMUNITY)
Admission: RE | Admit: 2020-03-11 | Discharge: 2020-03-11 | Disposition: A | Discharge status: Home / Self Care | Payer: Medicare HMO | Source: Skilled Nursing Facility | Attending: Adult Health | Admitting: Adult Health

## 2020-03-11 DIAGNOSIS — E876 Hypokalemia: Secondary | ICD-10-CM | POA: Insufficient documentation

## 2020-03-11 LAB — POTASSIUM: Potassium: 3.9 mmol/L (ref 3.5–5.1)

## 2020-03-18 ENCOUNTER — Non-Acute Institutional Stay (SKILLED_NURSING_FACILITY): Payer: Medicare HMO | Admitting: Adult Health

## 2020-03-18 DIAGNOSIS — I10 Essential (primary) hypertension: Secondary | ICD-10-CM | POA: Diagnosis not present

## 2020-03-18 DIAGNOSIS — F0390 Unspecified dementia without behavioral disturbance: Secondary | ICD-10-CM | POA: Diagnosis not present

## 2020-03-18 DIAGNOSIS — D649 Anemia, unspecified: Secondary | ICD-10-CM | POA: Diagnosis not present

## 2020-03-19 ENCOUNTER — Encounter: Payer: Self-pay | Admitting: Adult Health

## 2020-03-19 NOTE — Progress Notes (Signed)
Location:   [facility] Room Number: 222 Place of Service:  SNF (31)   CODE STATUS: full code   No Known Allergies  Chief Complaint  Patient presents with  . Acute Visit    care plan meeting     HPI:  We have come together for her care plan meeting. BIMS 12/15 mood 3/30. There are no reports of falls. Her weight is 193 pounds is up 6 pounds over the past quarter has a good appetite is feeding self. She will pack her belongings at times. She requires supervision to extensive assist with her adls; and is frequently incontinent of bladder and bowel. There are no reports of uncontrolled pain. No reports of constipation; no reports of insomnia. She continues to be followed for her chronic illnesses including:  Dementia without behavioral disturbance unspecified dementia type  Chronic anemia   Benign essential HTN.  Past Medical History:  Diagnosis Date  . Anemia   . Diabetes mellitus without complication (HCC)   . Glaucoma   . Hypercholesteremia   . Hypertension   . Hypokalemia   . Paraplegia (HCC)   . Status post total right knee replacement     Past Surgical History:  Procedure Laterality Date  . ABDOMINAL HYSTERECTOMY    . CATARACT EXTRACTION    . COLONOSCOPY N/A 12/10/2013   Procedure: COLONOSCOPY;  Surgeon: West Bali, MD;  [facility];  Service: Endoscopy;  Laterality: N/A;  8:30 AM  . TOTAL KNEE ARTHROPLASTY Right 03/30/2016   Procedure: TOTAL KNEE ARTHROPLASTY;  Surgeon: Vickki Hearing, MD;  Location: [facility];  Service: Orthopedics;  Laterality: Right;    Social History   Socioeconomic History  . Marital status: Divorced    Spouse name: Not on file  . Number of children: Not on file  . Years of education: Not on file  . Highest education level: Not on file  Occupational History  . Occupation: retired   Tobacco Use  . Smoking status: Current Some Day Smoker    Packs/day: 0.25    Years: 3.00    Pack years: 0.75    Types: Cigarettes    . Smokeless tobacco: Never Used  Vaping Use  . Vaping Use: Never used  Substance and Sexual Activity  . Alcohol use: No  . Drug use: No  . Sexual activity: Not Currently    Birth control/protection: Post-menopausal  Other Topics Concern  . Not on file  Social History Narrative   Long term resident of [facility]    Social Determinants of Health   Financial Resource Strain:   . Difficulty of Paying Living Expenses: Not on file  Food Insecurity:   . Worried About Programme researcher, broadcasting/film/video in the Last Year: Not on file  . Ran Out of Food in the Last Year: Not on file  Transportation Needs:   . Lack of Transportation (Medical): Not on file  . Lack of Transportation (Non-Medical): Not on file  Physical Activity: Inactive  . Days of Exercise per Week: 0 days  . Minutes of Exercise per Session: 0 min  Stress:   . Feeling of Stress : Not on file  Social Connections:   . Frequency of Communication with Friends and Family: Not on file  . Frequency of Social Gatherings with Friends and Family: Not on file  . Attends Religious Services: Not on file  . Active Member of Clubs or Organizations: Not on file  . Attends Banker Meetings: Not  on file  . Marital Status: Not on file  Intimate Partner Violence: Not At Risk  . Fear of Current or Ex-Partner: No  . Emotionally Abused: No  . Physically Abused: No  . Sexually Abused: No   Family History  Problem Relation Age of Onset  . Cancer Mother   . Arthritis Mother   . Hypotension Father   . Cancer Sister   . Hypotension Sister   . Colon cancer Neg Hx       VITAL SIGNS BP 132/78   Pulse 88   Temp 97.9 F (36.6 C)   Ht 5\' 5"  (1.651 m)   Wt 193 lb (87.5 kg)   BMI 32.12 kg/m   Outpatient Encounter Medications as of 03/18/2020  Medication Sig  . acetaminophen (TYLENOL) 325 MG tablet Take 650 mg by mouth 3 (three) times daily.   03/20/2020 aspirin EC 81 MG tablet Take 81 mg by mouth daily.  . calcium citrate (CALCITRATE - DOSED IN MG  ELEMENTAL CALCIUM) 950 MG tablet Take 200 mg of elemental calcium by mouth daily.  . diclofenac Sodium (VOLTAREN) 1 % GEL Apply 4 g topically 3 (three) times daily.   . iron polysaccharides (FERREX 150) 150 MG capsule Take 1 capsule by mouth daily.  Marland Kitchen latanoprost (XALATAN) 0.005 % ophthalmic solution Place 1 drop into both eyes at bedtime. wait 5 minutes between multiple eye drops  . naproxen (NAPROSYN) 500 MG tablet Take 1 tablet by mouth every 12 (twelve) hours as needed. for arthritis at the knee  . NON FORMULARY Diet: NAS,  . oxybutynin (DITROPAN-XL) 10 MG 24 hr tablet Take 10 mg by mouth daily.  . pantoprazole (PROTONIX) 40 MG tablet Take 40 mg by mouth daily.  . Potassium Chloride ER 20 MEQ TBCR Take 20 mEq by mouth daily at 6 (six) AM.  . vitamin C (ASCORBIC ACID) 250 MG tablet Take 500 mg by mouth daily.  Marland Kitchen zinc oxide 20 % ointment Apply 1 application topically in the morning, at noon, and at bedtime. Apply to sacrum and bilateral buttocks every shift   No facility-administered encounter medications on file as of 03/18/2020.     SIGNIFICANT DIAGNOSTIC EXAMS   PREVIOUS  09-03-19: chest x-ray: No acute abnormality.   09-03-19: ct of abdomen and pelvis:  1. Wall thickening of the distal esophagus, suspicious foresophagitis or reflux. 2. Slight heterogeneous nephrograms of both kidneys, may representpyelonephritis in the setting of recent urinary tract infection. No focal fluid collection. 3. No CT findings of acute cholecystitis. 4. Trabecular coarsening of the left inferior pubic ramus and ischium, may be sequela of prior trauma or Paget's disease. Aortic Atherosclerosis   09-04-19: right ankle x-ray: 1. No fracture, bone lesion or ankle joint abnormality.2. No evidence of osteomyelitis.  NO NEW EXAMS.   LABS REVIEWED PREVIOUS;   09-03-19: wbc 11.9; hgb 13.6; hct 43.6; mcv 83.8 plt 430; glucose 161; bun 28; creat 0.95 ;k+ 4.1; na++ 127; ca 9.1 liver normal albumin 3.9 urine  culture: e-coli ESBL and 50,000 proteus mirabilis 09-08-19: wbc 6.1 hgb 10.6; hct 33.7; mcv 86.4 plt 389; glucose 100; bun 13; creat 0.54; k+ 4.2; na++ 134; ca 8.3 09-24-19: wbc 5.0; hgb 11.4; hct 37.2; mcv 88.2 plt 441; glucose 137; bun 19; creat 0.59; k+ 4.3; na++ 136; ca 8.7  TODAY  02-19-20: glucose 89; bun 23; creat 0.54; k+ 4.1 na++ 137; ca 8.9  03-11-20: k+ 3.9   Review of Systems  Unable to perform ROS: Dementia (unable to  participate)    Physical Exam Constitutional:      General: She is not in acute distress.    Appearance: She is well-developed. She is obese. She is not diaphoretic.  Neck:     Thyroid: No thyromegaly.  Cardiovascular:     Rate and Rhythm: Normal rate and regular rhythm.     Pulses: Normal pulses.     Heart sounds: Normal heart sounds.  Pulmonary:     Effort: Pulmonary effort is normal. No respiratory distress.     Breath sounds: Normal breath sounds.  Abdominal:     General: Bowel sounds are normal. There is no distension.     Palpations: Abdomen is soft.     Tenderness: There is no abdominal tenderness.  Musculoskeletal:     Cervical back: Neck supple.     Right lower leg: No edema.     Left lower leg: No edema.     Comments: Is able to move all extremities  History of right knee replacement    Lymphadenopathy:     Cervical: No cervical adenopathy.  Skin:    General: Skin is warm and dry.  Neurological:     Mental Status: She is alert. Mental status is at baseline.  Psychiatric:        Mood and Affect: Mood normal.       ASSESSMENT/ PLAN:  TODAY  1. Dementia without behavioral disturbance unspecified dementia type 2. Chronic anemia 3.  Benign essential HTN.   Will continue current medications Will continue current plan of care Will continue to monitor her status.   MD is aware of resident's narcotic use and is in agreement with current plan of care. We will attempt to wean resident as appropriate.  Synthia Innocent NP [facility]  Contact [phone] Monday through Friday 8am- 5pm  After hours call [phone]

## 2020-04-07 ENCOUNTER — Non-Acute Institutional Stay (SKILLED_NURSING_FACILITY): Payer: Medicare HMO | Admitting: Adult Health

## 2020-04-07 ENCOUNTER — Encounter: Payer: Self-pay | Admitting: Adult Health

## 2020-04-07 DIAGNOSIS — H40053 Ocular hypertension, bilateral: Secondary | ICD-10-CM

## 2020-04-07 DIAGNOSIS — E876 Hypokalemia: Secondary | ICD-10-CM | POA: Diagnosis not present

## 2020-04-07 DIAGNOSIS — R3915 Urgency of urination: Secondary | ICD-10-CM | POA: Diagnosis not present

## 2020-04-07 NOTE — Progress Notes (Addendum)
Location:    [facility] Room Number: 105/D Place of Service:  SNF (31)   CODE STATUS: Full Code  No Known Allergies  Chief Complaint  Patient presents with  . Medical Management of Chronic Issues           Urinary urgency:   Hypokalemia:   Increased intraocular pressure bilateral    HPI:  She is a 85 year old long term resident of this facility being seen for the management of her chronic illnesses:Urinary urgency:   Hypokalemia:   Increased intraocular pressure bilateral. There are no reports of uncontrolled; no anxiety or agitation. No reports of insomnia present.    Past Medical History:  Diagnosis Date  . Anemia   . Diabetes mellitus without complication (HCC)   . Glaucoma   . Hypercholesteremia   . Hypertension   . Hypokalemia   . Paraplegia (HCC)   . Status post total right knee replacement     Past Surgical History:  Procedure Laterality Date  . ABDOMINAL HYSTERECTOMY    . CATARACT EXTRACTION    . COLONOSCOPY N/A 12/10/2013   Procedure: COLONOSCOPY;  Surgeon: West Bali, MD;  [facility];  Service: Endoscopy;  Laterality: N/A;  8:30 AM  . TOTAL KNEE ARTHROPLASTY Right 03/30/2016   Procedure: TOTAL KNEE ARTHROPLASTY;  Surgeon: Vickki Hearing, MD;  Location: [facility];  Service: Orthopedics;  Laterality: Right;    Social History   Socioeconomic History  . Marital status: Divorced    Spouse name: Not on file  . Number of children: Not on file  . Years of education: Not on file  . Highest education level: Not on file  Occupational History  . Occupation: retired   Tobacco Use  . Smoking status: Current Some Day Smoker    Packs/day: 0.25    Years: 3.00    Pack years: 0.75    Types: Cigarettes  . Smokeless tobacco: Never Used  Vaping Use  . Vaping Use: Never used  Substance and Sexual Activity  . Alcohol use: No  . Drug use: No  . Sexual activity: Not Currently    Birth control/protection: Post-menopausal  Other  Topics Concern  . Not on file  Social History Narrative   Long term resident of [facility]    Social Determinants of Health   Financial Resource Strain:   . Difficulty of Paying Living Expenses: Not on file  Food Insecurity:   . Worried About Programme researcher, broadcasting/film/video in the Last Year: Not on file  . Ran Out of Food in the Last Year: Not on file  Transportation Needs:   . Lack of Transportation (Medical): Not on file  . Lack of Transportation (Non-Medical): Not on file  Physical Activity: Inactive  . Days of Exercise per Week: 0 days  . Minutes of Exercise per Session: 0 min  Stress:   . Feeling of Stress : Not on file  Social Connections:   . Frequency of Communication with Friends and Family: Not on file  . Frequency of Social Gatherings with Friends and Family: Not on file  . Attends Religious Services: Not on file  . Active Member of Clubs or Organizations: Not on file  . Attends Banker Meetings: Not on file  . Marital Status: Not on file  Intimate Partner Violence: Not At Risk  . Fear of Current or Ex-Partner: No  . Emotionally Abused: No  . Physically Abused: No  . Sexually Abused: No  Family History  Problem Relation Age of Onset  . Cancer Mother   . Arthritis Mother   . Hypotension Father   . Cancer Sister   . Hypotension Sister   . Colon cancer Neg Hx       VITAL SIGNS BP 135/65   Pulse 90   Temp 98.7 F (37.1 C) (Oral)   Ht 5\' 5"  (1.651 m)   Wt 191 lb (86.6 kg)   BMI 31.78 kg/m   Outpatient Encounter Medications as of 04/07/2020  Medication Sig  . acetaminophen (TYLENOL) 325 MG tablet Take 650 mg by mouth 3 (three) times daily.   04/09/2020 aspirin EC 81 MG tablet Take 81 mg by mouth daily.  . calcium citrate (CALCITRATE - DOSED IN MG ELEMENTAL CALCIUM) 950 MG tablet Take 200 mg of elemental calcium by mouth daily.  . diclofenac Sodium (VOLTAREN) 1 % GEL Apply 4 g topically 3 (three) times daily.   . iron polysaccharides (FERREX 150) 150 MG capsule Take  1 capsule by mouth daily.  Marland Kitchen latanoprost (XALATAN) 0.005 % ophthalmic solution Place 1 drop into both eyes at bedtime. wait 5 minutes between multiple eye drops  . naproxen (NAPROSYN) 500 MG tablet Take 1 tablet by mouth every 12 (twelve) hours as needed. for arthritis at the knee  . NON FORMULARY Diet: NAS,  . oxybutynin (DITROPAN-XL) 10 MG 24 hr tablet Take 10 mg by mouth daily.  . pantoprazole (PROTONIX) 40 MG tablet Take 40 mg by mouth daily.  . vitamin C (ASCORBIC ACID) 250 MG tablet Take 500 mg by mouth daily.  Marland Kitchen zinc oxide 20 % ointment Apply 1 application topically in the morning, at noon, and at bedtime. Apply to sacrum and bilateral buttocks every shift  . [DISCONTINUED] Potassium Chloride ER 20 MEQ TBCR Take 20 mEq by mouth daily at 6 (six) AM.   No facility-administered encounter medications on file as of 04/07/2020.     SIGNIFICANT DIAGNOSTIC EXAMS   PREVIOUS  09-03-19: chest x-ray: No acute abnormality.   09-03-19: ct of abdomen and pelvis:  1. Wall thickening of the distal esophagus, suspicious foresophagitis or reflux. 2. Slight heterogeneous nephrograms of both kidneys, may representpyelonephritis in the setting of recent urinary tract infection. No focal fluid collection. 3. No CT findings of acute cholecystitis. 4. Trabecular coarsening of the left inferior pubic ramus and ischium, may be sequela of prior trauma or Paget's disease. Aortic Atherosclerosis   09-04-19: right ankle x-ray: 1. No fracture, bone lesion or ankle joint abnormality.2. No evidence of osteomyelitis.  02-05-20: DEXA: t score: -2.279  NO NEW EXAMS.   LABS REVIEWED PREVIOUS;   09-03-19: wbc 11.9; hgb 13.6; hct 43.6; mcv 83.8 plt 430; glucose 161; bun 28; creat 0.95 ;k+ 4.1; na++ 127; ca 9.1 liver normal albumin 3.9 urine culture: e-coli ESBL and 50,000 proteus mirabilis 09-08-19: wbc 6.1 hgb 10.6; hct 33.7; mcv 86.4 plt 389; glucose 100; bun 13; creat 0.54; k+ 4.2; na++ 134; ca 8.3 09-24-19: wbc  5.0; hgb 11.4; hct 37.2; mcv 88.2 plt 441; glucose 137; bun 19; creat 0.59; k+ 4.3; na++ 136; ca 8.7 02-19-20: glucose 89; bun 23; creat 0.54; k+ 4.1 na++ 137; ca 8.9  03-11-20: k+ 3.9  NO NEW LABS.    Review of Systems  Unable to perform ROS: Dementia (unable to participate )    Physical Exam Constitutional:      General: She is not in acute distress.    Appearance: She is well-developed. She is obese. She  is not diaphoretic.  Neck:     Thyroid: No thyromegaly.  Cardiovascular:     Rate and Rhythm: Normal rate and regular rhythm.     Pulses: Normal pulses.     Heart sounds: Normal heart sounds.  Pulmonary:     Effort: Pulmonary effort is normal. No respiratory distress.     Breath sounds: Normal breath sounds.  Abdominal:     General: Bowel sounds are normal. There is no distension.     Palpations: Abdomen is soft.     Tenderness: There is no abdominal tenderness.  Musculoskeletal:     Cervical back: Neck supple.     Right lower leg: No edema.     Left lower leg: No edema.     Comments: Is able to move all extremities  History of right knee replacement     Lymphadenopathy:     Cervical: No cervical adenopathy.  Skin:    General: Skin is warm and dry.  Neurological:     Mental Status: She is alert. Mental status is at baseline.  Psychiatric:        Mood and Affect: Mood normal.       ASSESSMENT/ PLAN:  TODAY;   1. Urinary urgency: is stable will continue ditropan xl 10 mg daily   2. Hypokalemia: is stable k+ 3.9 will monitor   3. Increased intraocular pressure bilateral: is stable will continue xalatan to both eyes.   PREVIOUS  4. Primary osteoarthritis right knee is stable will continue tylenol 650 mg three times daily voltaren gel 4 gm right knee three times daily   5. Essential hypertension: is stable b/p 135/65 will continue asa 81 mg daily   6. Chronic anemia: is stable hgb 11.4 will continue ferrex daily   7. GERD without esophagitis: is stable will  continue protonix 40 mg daily      Will check: CBC CMP   MD is aware of resident's narcotic use and is in agreement with current plan of care. We will attempt to wean resident as appropriate.  Synthia Innocent NP [facility]  Contact [phone] Monday through Friday 8am- 5pm  After hours call [phone]

## 2020-04-08 ENCOUNTER — Other Ambulatory Visit (HOSPITAL_COMMUNITY)
Admission: RE | Admit: 2020-04-08 | Discharge: 2020-04-08 | Disposition: A | Discharge status: Home / Self Care | Payer: Medicare HMO | Source: Skilled Nursing Facility | Attending: Adult Health | Admitting: Adult Health

## 2020-04-08 DIAGNOSIS — I10 Essential (primary) hypertension: Secondary | ICD-10-CM | POA: Diagnosis present

## 2020-04-08 LAB — COMPREHENSIVE METABOLIC PANEL
ALT: 10 U/L (ref 0–44)
AST: 14 U/L — ABNORMAL LOW (ref 15–41)
Albumin: 2.9 g/dL — ABNORMAL LOW (ref 3.5–5.0)
Alkaline Phosphatase: 70 U/L (ref 38–126)
Anion gap: 8 (ref 5–15)
BUN: 19 mg/dL (ref 8–23)
CO2: 25 mmol/L (ref 22–32)
Calcium: 8.7 mg/dL — ABNORMAL LOW (ref 8.9–10.3)
Chloride: 105 mmol/L (ref 98–111)
Creatinine, Ser: 0.56 mg/dL (ref 0.44–1.00)
GFR calc Af Amer: >60 mL/min (ref >60)
GFR calc non Af Amer: >60 mL/min (ref >60)
Glucose, Bld: 91 mg/dL (ref 70–99)
Potassium: 3.6 mmol/L (ref 3.5–5.1)
Sodium: 138 mmol/L (ref 135–145)
Total Bilirubin: 0.3 mg/dL (ref 0.3–1.2)
Total Protein: 6.6 g/dL (ref 6.5–8.1)

## 2020-04-08 LAB — CBC
HCT: 32 % — ABNORMAL LOW (ref 36.0–46.0)
Hemoglobin: 9.6 g/dL — ABNORMAL LOW (ref 12.0–15.0)
MCH: 24.7 pg — ABNORMAL LOW (ref 26.0–34.0)
MCHC: 30 g/dL (ref 30.0–36.0)
MCV: 82.3 fL (ref 80.0–100.0)
Platelets: 372 10*3/uL (ref 150–400)
RBC: 3.89 MIL/uL (ref 3.87–5.11)
RDW: 15.5 % (ref 11.5–15.5)
WBC: 6.4 10*3/uL (ref 4.0–10.5)
nRBC: 0 % (ref 0.0–0.2)

## 2020-05-12 ENCOUNTER — Encounter: Payer: Self-pay | Admitting: Adult Health

## 2020-05-12 ENCOUNTER — Non-Acute Institutional Stay (SKILLED_NURSING_FACILITY): Payer: Medicare HMO | Admitting: Adult Health

## 2020-05-12 DIAGNOSIS — M1711 Unilateral primary osteoarthritis, right knee: Secondary | ICD-10-CM

## 2020-05-12 DIAGNOSIS — D649 Anemia, unspecified: Secondary | ICD-10-CM | POA: Diagnosis not present

## 2020-05-12 DIAGNOSIS — I10 Essential (primary) hypertension: Secondary | ICD-10-CM

## 2020-05-12 NOTE — Progress Notes (Signed)
Location:    [facility] Room Number: 105/D Place of Service:  SNF (31)   CODE STATUS: Full Code  No Known Allergies  Chief Complaint  Patient presents with   Medical Management of Chronic Issues            Primary osteoarthritis right knee:  Essential hypertension Chronic anemia:    HPI:  She is a 85 year old long term resident of this facility being seen for the management of her chronic illnesses:Primary osteoarthritis right knee:  Essential hypertension Chronic anemia. There are no reports of uncontrolled pain; no reports of changes in appetite; no heart burn; no anxiety.    Past Medical History:  Diagnosis Date   Anemia    Diabetes mellitus without complication (HCC)    Glaucoma    Hypercholesteremia    Hypertension    Hypokalemia    Paraplegia (HCC)    Status post total right knee replacement     Past Surgical History:  Procedure Laterality Date   ABDOMINAL HYSTERECTOMY     CATARACT EXTRACTION     COLONOSCOPY N/A 12/10/2013   Procedure: COLONOSCOPY;  Surgeon: West Bali, MD;  Location: [facility];  Service: Endoscopy;  Laterality: N/A;  8:30 AM   TOTAL KNEE ARTHROPLASTY Right 03/30/2016   Procedure: TOTAL KNEE ARTHROPLASTY;  Surgeon: Vickki Hearing, MD;  Location: [facility];  Service: Orthopedics;  Laterality: Right;    Social History   Socioeconomic History   Marital status: Divorced    Spouse name: Not on file   Number of children: Not on file   Years of education: Not on file   Highest education level: Not on file  Occupational History   Occupation: retired   Tobacco Use   Smoking status: Current Some Day Smoker    Packs/day: 0.25    Years: 3.00    Pack years: 0.75    Types: Cigarettes   Smokeless tobacco: Never Used  Building services engineer Use: Never used  Substance and Sexual Activity   Alcohol use: No   Drug use: No   Sexual activity: Not Currently    Birth control/protection: Post-menopausal    Other Topics Concern   Not on file  Social History Narrative   Long term resident of [facility]    Social Determinants of Health   Financial Resource Strain:    Difficulty of Paying Living Expenses: Not on file  Food Insecurity:    Worried About Programme researcher, broadcasting/film/video in the Last Year: Not on file   [employer] of Food in the Last Year: Not on file  Transportation Needs:    Lack of Transportation (Medical): Not on file   Lack of Transportation (Non-Medical): Not on file  Physical Activity: Inactive   Days of Exercise per Week: 0 days   Minutes of Exercise per Session: 0 min  Stress:    Feeling of Stress : Not on file  Social Connections:    Frequency of Communication with Friends and Family: Not on file   Frequency of Social Gatherings with Friends and Family: Not on file   Attends Religious Services: Not on file   Active Member of Clubs or Organizations: Not on file   Attends Banker Meetings: Not on file   Marital Status: Not on file  Intimate Partner Violence: Not At Risk   Fear of Current or Ex-Partner: No   Emotionally Abused: No   Physically Abused: No   Sexually Abused:  No   Family History  Problem Relation Age of Onset   Cancer Mother    Arthritis Mother    Hypotension Father    Cancer Sister    Hypotension Sister    Colon cancer Neg Hx       VITAL SIGNS BP (!) 148/97    Pulse 98    Temp (!) 97.4 F (36.3 C)    Ht 5\' 5"  (1.651 m)    Wt 198 lb 12.8 oz (90.2 kg)    BMI 33.08 kg/m   Outpatient Encounter Medications as of 05/12/2020  Medication Sig   acetaminophen (TYLENOL) 325 MG tablet Take 650 mg by mouth 3 (three) times daily.    aspirin EC 81 MG tablet Take 81 mg by mouth daily.   calcium citrate (CALCITRATE - DOSED IN MG ELEMENTAL CALCIUM) 950 MG tablet Take 200 mg of elemental calcium by mouth daily.   diclofenac Sodium (VOLTAREN) 1 % GEL Apply 4 g topically 3 (three) times daily.    iron polysaccharides (FERREX 150)  150 MG capsule Take 1 capsule by mouth daily.   latanoprost (XALATAN) 0.005 % ophthalmic solution Place 1 drop into both eyes at bedtime. wait 5 minutes between multiple eye drops   naproxen (NAPROSYN) 500 MG tablet Take 1 tablet by mouth every 12 (twelve) hours as needed. for arthritis at the knee   NON FORMULARY Diet: NAS,   oxybutynin (DITROPAN-XL) 10 MG 24 hr tablet Take 10 mg by mouth daily.   pantoprazole (PROTONIX) 40 MG tablet Take 40 mg by mouth daily.   vitamin C (ASCORBIC ACID) 250 MG tablet Take 500 mg by mouth daily.   zinc oxide 20 % ointment Apply 1 application topically in the morning, at noon, and at bedtime. Apply to sacrum and bilateral buttocks every shift   No facility-administered encounter medications on file as of 05/12/2020.     SIGNIFICANT DIAGNOSTIC EXAMS   PREVIOUS  09-03-19: chest x-ray: No acute abnormality.   09-03-19: ct of abdomen and pelvis:  1. Wall thickening of the distal esophagus, suspicious foresophagitis or reflux. 2. Slight heterogeneous nephrograms of both kidneys, may representpyelonephritis in the setting of recent urinary tract infection. No focal fluid collection. 3. No CT findings of acute cholecystitis. 4. Trabecular coarsening of the left inferior pubic ramus and ischium, may be sequela of prior trauma or Paget's disease. Aortic Atherosclerosis   09-04-19: right ankle x-ray: 1. No fracture, bone lesion or ankle joint abnormality.2. No evidence of osteomyelitis.  02-05-20: DEXA: t score: -2.279  NO NEW EXAMS.   LABS REVIEWED PREVIOUS;   09-03-19: wbc 11.9; hgb 13.6; hct 43.6; mcv 83.8 plt 430; glucose 161; bun 28; creat 0.95 ;k+ 4.1; na++ 127; ca 9.1 liver normal albumin 3.9 urine culture: e-coli ESBL and 50,000 proteus mirabilis 09-08-19: wbc 6.1 hgb 10.6; hct 33.7; mcv 86.4 plt 389; glucose 100; bun 13; creat 0.54; k+ 4.2; na++ 134; ca 8.3 09-24-19: wbc 5.0; hgb 11.4; hct 37.2; mcv 88.2 plt 441; glucose 137; bun 19; creat 0.59;  k+ 4.3; na++ 136; ca 8.7 02-19-20: glucose 89; bun 23; creat 0.54; k+ 4.1 na++ 137; ca 8.9  03-11-20: k+ 3.9  TODAY  04-08-20: wbc 6.4; hgb 9.6; hct 32.0; mcv 82.3 plt 372; glucose 91; bun 19; creat 0.56; k+ 3.6; na++ 138; ca 8.7 liver normal albumin 2.9   Review of Systems  Unable to perform ROS: Dementia (unable to participate )   Physical Exam Constitutional:      General: She  is not in acute distress.    Appearance: She is well-developed. She is obese. She is not diaphoretic.  Neck:     Thyroid: No thyromegaly.  Cardiovascular:     Rate and Rhythm: Normal rate and regular rhythm.     Pulses: Normal pulses.     Heart sounds: Normal heart sounds.  Pulmonary:     Effort: Pulmonary effort is normal. No respiratory distress.     Breath sounds: Normal breath sounds.  Abdominal:     General: Bowel sounds are normal. There is no distension.     Palpations: Abdomen is soft.     Tenderness: There is no abdominal tenderness.  Musculoskeletal:     Cervical back: Neck supple.     Right lower leg: No edema.     Left lower leg: No edema.     Comments:  Is able to move all extremities  History of right knee replacement      Lymphadenopathy:     Cervical: No cervical adenopathy.  Skin:    General: Skin is warm and dry.  Neurological:     Mental Status: She is alert. Mental status is at baseline.  Psychiatric:        Mood and Affect: Mood normal.       ASSESSMENT/ PLAN:  TODAY;   1. Primary osteoarthritis right knee: is stable will continue tylenol 650 mg three times daily voltaren gel 4 gm right knee three times daily   2. Essential hypertension is stable b/p 148/97 will continue asa 81 mg daily   3. Chronic anemia: is without change hgb 9.6 will continue ferrex daily    PREVIOUS  4. GERD without esophagitis: is stable will continue protonix 40 mg daily   5. Urinary urgency: is stable will continue ditropan xl 10 mg daily   6. Hypokalemia: is stable k+ 3.6 will monitor    7. Increased intraocular pressure bilateral: is stable will continue xalatan to both eyes.          MD is aware of resident's narcotic use and is in agreement with current plan of care. We will attempt to wean resident as appropriate.  Synthia Innocent NP [facility]  Contact [phone] Monday through Friday 8am- 5pm  After hours call [phone]

## 2020-05-18 ENCOUNTER — Other Ambulatory Visit (HOSPITAL_COMMUNITY)
Admission: RE | Admit: 2020-05-18 | Discharge: 2020-05-18 | Disposition: A | Discharge status: Home / Self Care | Payer: Medicare HMO | Source: Skilled Nursing Facility | Attending: Adult Health | Admitting: Adult Health

## 2020-05-18 DIAGNOSIS — D649 Anemia, unspecified: Secondary | ICD-10-CM | POA: Insufficient documentation

## 2020-05-18 LAB — HEMOGLOBIN AND HEMATOCRIT, BLOOD
HCT: 32.9 % — ABNORMAL LOW (ref 36.0–46.0)
Hemoglobin: 10 g/dL — ABNORMAL LOW (ref 12.0–15.0)

## 2020-06-08 ENCOUNTER — Non-Acute Institutional Stay (SKILLED_NURSING_FACILITY): Payer: Medicare HMO | Admitting: Adult Health

## 2020-06-08 ENCOUNTER — Encounter: Payer: Self-pay | Admitting: Adult Health

## 2020-06-08 DIAGNOSIS — R3915 Urgency of urination: Secondary | ICD-10-CM

## 2020-06-08 DIAGNOSIS — E876 Hypokalemia: Secondary | ICD-10-CM

## 2020-06-08 DIAGNOSIS — K219 Gastro-esophageal reflux disease without esophagitis: Secondary | ICD-10-CM | POA: Diagnosis not present

## 2020-06-08 NOTE — Progress Notes (Signed)
Location:    [facility] Room Number: 105-D Place of Service:  SNF (31)   CODE STATUS: Full code  No Known Allergies  Chief Complaint  Patient presents with   Medical Management of Chronic Issues         GERD without esophagitis  Urinary urgency    Hypokalemia:    HPI:  She is a 85 year old long term resident of this facility being seen for the management of her chronic illnesses; GERD without esophagitis  Urinary urgency    Hypokalemia. She does complain of right hand pain with some swelling of her knuckles. She denies any changes in appetite; no insomnia; no issues with heart burn.   Past Medical History:  Diagnosis Date   Anemia    Diabetes mellitus without complication (HCC)    Glaucoma    Hypercholesteremia    Hypertension    Hypokalemia    Paraplegia (HCC)    Status post total right knee replacement     Past Surgical History:  Procedure Laterality Date   ABDOMINAL HYSTERECTOMY     CATARACT EXTRACTION     COLONOSCOPY N/A 12/10/2013   Procedure: COLONOSCOPY;  Surgeon: West Bali, MD;  Location: [facility];  Service: Endoscopy;  Laterality: N/A;  8:30 AM   TOTAL KNEE ARTHROPLASTY Right 03/30/2016   Procedure: TOTAL KNEE ARTHROPLASTY;  Surgeon: Vickki Hearing, MD;  Location: [facility];  Service: Orthopedics;  Laterality: Right;    Social History   Socioeconomic History   Marital status: Divorced    Spouse name: Not on file   Number of children: Not on file   Years of education: Not on file   Highest education level: Not on file  Occupational History   Occupation: retired   Tobacco Use   Smoking status: Current Some Day Smoker    Packs/day: 0.25    Years: 3.00    Pack years: 0.75    Types: Cigarettes   Smokeless tobacco: Never Used  Building services engineer Use: Never used  Substance and Sexual Activity   Alcohol use: No   Drug use: No   Sexual activity: Not Currently    Birth control/protection:  Post-menopausal  Other Topics Concern   Not on file  Social History Narrative   Long term resident of [facility]    Social Determinants of Health   Financial Resource Strain:    Difficulty of Paying Living Expenses: Not on file  Food Insecurity:    Worried About Programme researcher, broadcasting/film/video in the Last Year: Not on file   [employer] of Food in the Last Year: Not on file  Transportation Needs:    Lack of Transportation (Medical): Not on file   Lack of Transportation (Non-Medical): Not on file  Physical Activity: Inactive   Days of Exercise per Week: 0 days   Minutes of Exercise per Session: 0 min  Stress:    Feeling of Stress : Not on file  Social Connections:    Frequency of Communication with Friends and Family: Not on file   Frequency of Social Gatherings with Friends and Family: Not on file   Attends Religious Services: Not on file   Active Member of Clubs or Organizations: Not on file   Attends Banker Meetings: Not on file   Marital Status: Not on file  Intimate Partner Violence: Not At Risk   Fear of Current or Ex-Partner: No   Emotionally Abused: No   Physically  Abused: No   Sexually Abused: No   Family History  Problem Relation Age of Onset   Cancer Mother    Arthritis Mother    Hypotension Father    Cancer Sister    Hypotension Sister    Colon cancer Neg Hx       VITAL SIGNS BP 116/73    Pulse 99    Temp 97.9 F (36.6 C)    Resp 20    Ht 5\' 5"  (1.651 m)    Wt 203 lb 9.6 oz (92.4 kg)    SpO2 95%    BMI 33.88 kg/m   Outpatient Encounter Medications as of 06/08/2020  Medication Sig   acetaminophen (TYLENOL) 325 MG tablet Take 650 mg by mouth 3 (three) times daily.    ascorbic acid (VITAMIN C) 500 MG tablet Take 500 mg by mouth daily.   aspirin EC 81 MG tablet Take 81 mg by mouth daily.   calcium citrate (CALCITRATE - DOSED IN MG ELEMENTAL CALCIUM) 950 MG tablet Take 200 mg of elemental calcium by mouth daily.   diclofenac Sodium  (VOLTAREN) 1 % GEL Apply 4 g topically 3 (three) times daily.    iron polysaccharides (FERREX 150) 150 MG capsule Take 1 capsule by mouth daily.   latanoprost (XALATAN) 0.005 % ophthalmic solution Place 1 drop into both eyes at bedtime. wait 5 minutes between multiple eye drops   naproxen (NAPROSYN) 500 MG tablet Take 1 tablet by mouth every 12 (twelve) hours as needed. for arthritis at the knee   NON FORMULARY Diet: NAS,   oxybutynin (DITROPAN-XL) 10 MG 24 hr tablet Take 10 mg by mouth daily.   pantoprazole (PROTONIX) 40 MG tablet Take 40 mg by mouth daily.   zinc oxide 20 % ointment Apply 1 application topically in the morning, at noon, and at bedtime. Apply to sacrum and bilateral buttocks every shift   [DISCONTINUED] vitamin C (ASCORBIC ACID) 250 MG tablet Take 500 mg by mouth daily.   No facility-administered encounter medications on file as of 06/08/2020.     SIGNIFICANT DIAGNOSTIC EXAMS  PREVIOUS  09-03-19: chest x-ray: No acute abnormality.   09-03-19: ct of abdomen and pelvis:  1. Wall thickening of the distal esophagus, suspicious foresophagitis or reflux. 2. Slight heterogeneous nephrograms of both kidneys, may representpyelonephritis in the setting of recent urinary tract infection. No focal fluid collection. 3. No CT findings of acute cholecystitis. 4. Trabecular coarsening of the left inferior pubic ramus and ischium, may be sequela of prior trauma or Paget's disease. Aortic Atherosclerosis   09-04-19: right ankle x-ray: 1. No fracture, bone lesion or ankle joint abnormality.2. No evidence of osteomyelitis.  02-05-20: DEXA: t score: -2.279  NO NEW EXAMS.   LABS REVIEWED PREVIOUS;   09-03-19: wbc 11.9; hgb 13.6; hct 43.6; mcv 83.8 plt 430; glucose 161; bun 28; creat 0.95 ;k+ 4.1; na++ 127; ca 9.1 liver normal albumin 3.9 urine culture: e-coli ESBL and 50,000 proteus mirabilis 09-08-19: wbc 6.1 hgb 10.6; hct 33.7; mcv 86.4 plt 389; glucose 100; bun 13; creat 0.54;  k+ 4.2; na++ 134; ca 8.3 09-24-19: wbc 5.0; hgb 11.4; hct 37.2; mcv 88.2 plt 441; glucose 137; bun 19; creat 0.59; k+ 4.3; na++ 136; ca 8.7 02-19-20: glucose 89; bun 23; creat 0.54; k+ 4.1 na++ 137; ca 8.9  03-11-20: k+ 3.9 04-08-20: wbc 6.4; hgb 9.6; hct 32.0; mcv 82.3 plt 372; glucose 91; bun 19; creat 0.56; k+ 3.6; na++ 138; ca 8.7 liver normal albumin 2.9  TODAY  05-18-20: hgb 10.0; hct 32.9   Review of Systems  Unable to perform ROS: Dementia (unable to participate )    Physical Exam Constitutional:      General: She is not in acute distress.    Appearance: She is well-developed. She is obese. She is not diaphoretic.  Neck:     Thyroid: No thyromegaly.  Cardiovascular:     Rate and Rhythm: Normal rate and regular rhythm.     Pulses: Normal pulses.     Heart sounds: Normal heart sounds.  Pulmonary:     Effort: Pulmonary effort is normal. No respiratory distress.     Breath sounds: Normal breath sounds.  Abdominal:     General: Bowel sounds are normal. There is no distension.     Palpations: Abdomen is soft.     Tenderness: There is no abdominal tenderness.  Musculoskeletal:     Cervical back: Neck supple.     Right lower leg: No edema.     Left lower leg: No edema.     Comments:   Is able to move all extremities  History of right knee replacement      Right hand joints are slightly swollen without redness present.   Lymphadenopathy:     Cervical: No cervical adenopathy.  Skin:    General: Skin is warm and dry.  Neurological:     Mental Status: She is alert. Mental status is at baseline.  Psychiatric:        Mood and Affect: Mood normal.      ASSESSMENT/ PLAN:  TODAY;   1. GERD without esophagitis is stable will continue protonix 40 mg daily  2. Urinary urgency is stable will continue ditropan xl 10 mg daily '  3. Hypokalemia: is stable k+ 3.6 will monitor   PREVIOUS  4. Increased intraocular pressure bilateral: is stable will continue xalatan to both  eyes.   5. Primary osteoarthritis right knee: is stable will continue tylenol 650 mg three times daily voltaren gel 4 gm right knee three times daily   6. Essential hypertension is stable b/p 116/73 will continue asa 81 mg daily   7. Chronic anemia: is without change hgb 10.0 will continue ferrex daily    Will check uric acid level    MD is aware of resident's narcotic use and is in agreement with current plan of care. We will attempt to wean resident as appropriate.  Synthia Innocent NP [facility]  Contact [phone] Monday through Friday 8am- 5pm  After hours call [phone]

## 2020-06-09 ENCOUNTER — Other Ambulatory Visit (HOSPITAL_COMMUNITY)
Admission: RE | Admit: 2020-06-09 | Discharge: 2020-06-09 | Disposition: A | Discharge status: Home / Self Care | Payer: Medicare HMO | Source: Skilled Nursing Facility | Attending: Adult Health | Admitting: Adult Health

## 2020-06-09 DIAGNOSIS — M1711 Unilateral primary osteoarthritis, right knee: Secondary | ICD-10-CM | POA: Diagnosis present

## 2020-06-09 LAB — URIC ACID: Uric Acid, Serum: 4.8 mg/dL (ref 2.5–7.1)

## 2020-06-16 ENCOUNTER — Encounter: Payer: Self-pay | Admitting: Adult Health

## 2020-06-16 ENCOUNTER — Non-Acute Institutional Stay (SKILLED_NURSING_FACILITY): Payer: Medicare HMO | Admitting: Adult Health

## 2020-06-16 DIAGNOSIS — I10 Essential (primary) hypertension: Secondary | ICD-10-CM | POA: Diagnosis not present

## 2020-06-16 DIAGNOSIS — F0390 Unspecified dementia without behavioral disturbance: Secondary | ICD-10-CM

## 2020-06-16 DIAGNOSIS — K219 Gastro-esophageal reflux disease without esophagitis: Secondary | ICD-10-CM

## 2020-06-16 NOTE — Progress Notes (Signed)
Location:    [facility] Room Number: 105/D Place of Service:  SNF (31)   CODE STATUS: Full Code  No Known Allergies  Chief Complaint  Patient presents with  . Acute Visit    Care Plan Meeting     HPI:  We have come together for her care plan meeting. BIMS 12/15 mood 4/30. She requires extensive assistance to dependent for her adls; is incontinent of bladder and bowel. She is nonambulatory is able to feed self. There have been no recent falls. Her appetite is good her current weight is 203.6 pounds which is up 10 pounds in 3 months. She is involved in activities. She continues to be followed for her chronic illnesses including: GERD without esophagitis  Dementia without behavioral disturbance unspecified dementia type Essential hypertension.  Past Medical History:  Diagnosis Date  . Abnormal posture    Per [employer], [facility] Records [facility]   . Anemia   . Cognitive communication deficit    Per [employer], [facility]   . Contracture of left knee    Per [employer], [facility]   . Diabetes mellitus without complication (HCC)   . Difficulty walking    Per [employer], [facility]   . Extended spectrum beta lactamase (ESBL) resistance    Per [employer], [facility]   . Gastroesophageal reflux disease    Per [employer], [facility]   . Glaucoma   . Gram negative sepsis (HCC)    Per [employer], [facility] Records [facility]   . Hypercholesteremia   . Hypertension   . Hypokalemia   . Infection, bacterial    Per [employer], [facility] Records [facility]   . Muscle weakness (generalized)    Per [employer], [facility]   . Need for assistance with  personal care    Per [employer], [facility]   . OAB (overactive bladder)    Per [employer], [facility]   . Ocular hypertension, bilateral    Per [employer], [facility]   . Osteoarthritis of right knee    Per [employer], [facility]   . Paraplegia (HCC)   . Pyelonephritis, acute    Per [employer], [facility]   . Status post total right knee replacement   . Unspecified dementia without behavioral disturbance (HCC)    Per [employer], [facility]   . Urgency of urination    Per [employer], [facility]   . Wheelchair dependence    Per [employer], [facility] Electronic [facility] Records [facility]     Past Surgical History:  Procedure Laterality Date  . ABDOMINAL HYSTERECTOMY    . CATARACT EXTRACTION    . COLONOSCOPY N/A 12/10/2013   Procedure: COLONOSCOPY;  Surgeon: West Bali, MD;  Location: [facility];  Service: Endoscopy;  Laterality: N/A;  8:30 AM  . TOTAL KNEE ARTHROPLASTY Right 03/30/2016   Procedure: TOTAL KNEE ARTHROPLASTY;  Surgeon: Vickki Hearing, MD;  Location: [facility]S;  Service: Orthopedics;  Laterality: Right;    Social History   Socioeconomic History  . Marital status: Divorced    Spouse  name: Not on file  . Number of children: Not on file  . Years of education: Not on file  . Highest education level: Not on file  Occupational History  . Occupation: retired   Tobacco Use  . Smoking status: Current Some Day Smoker    Packs/day: 0.25    Years: 3.00    Pack years: 0.75    Types: Cigarettes  . Smokeless tobacco: Never Used  Vaping Use  . Vaping Use: Never used  Substance and Sexual Activity  . Alcohol use: No  . Drug use: No  . Sexual activity: Not Currently    Birth  control/protection: Post-menopausal  Other Topics Concern  . Not on file  Social History Narrative   [facility]    Social Determinants of Health   Financial Resource Strain:   . Difficulty of Paying Living Expenses: Not on file  Food Insecurity:   . Worried About Programme researcher, broadcasting/film/videounning Out of Food in the Last Year: Not on file  . Ran Out of Food in the Last Year: Not on file  Transportation Needs:   . Lack of Transportation (Medical): Not on file  . Lack of Transportation (Non-Medical): Not on file  Physical Activity: Inactive  . Days of Exercise per Week: 0 days  . Minutes of Exercise per Session: 0 min  Stress:   . Feeling of Stress : Not on file  Social Connections:   . Frequency of Communication with Friends and Family: Not on file  . Frequency of Social Gatherings with Friends and Family: Not on file  . Attends Religious Services: Not on file  . Active Member of Clubs or Organizations: Not on file  . Attends BankerClub or Organization Meetings: Not on file  . Marital Status: Not on file  Intimate Partner Violence: Not At Risk  . Fear of Current or Ex-Partner: No  . Emotionally Abused: No  . Physically Abused: No  . Sexually Abused: No   Family History  Problem Relation Age of Onset  . Cancer Mother   . Arthritis Mother   . Hypotension Father   . Cancer Sister   . Hypotension Sister   . Colon cancer Neg Hx       VITAL SIGNS BP (!) 152/78   Pulse 92   Temp 98.3 F (36.8 C)   Ht 5\' 5"  (1.651 m)   Wt 203 lb 9.6 oz (92.4 kg)   BMI 33.88 kg/m   Outpatient Encounter Medications as of 06/16/2020  Medication Sig  . acetaminophen (TYLENOL) 325 MG tablet Take 650 mg by mouth 3 (three) times daily.   Marland Kitchen. ascorbic acid (VITAMIN C) 500 MG tablet Take 500 mg by mouth daily.  Marland Kitchen. aspirin EC 81 MG tablet Take 81 mg by mouth daily.  . calcium citrate (CALCITRATE - DOSED IN MG ELEMENTAL CALCIUM) 950 MG tablet Take 200 mg of elemental calcium by mouth daily.  . diclofenac Sodium  (VOLTAREN) 1 % GEL Apply 4 g topically 3 (three) times daily.   . iron polysaccharides (FERREX 150) 150 MG capsule Take 1 capsule by mouth daily.  Marland Kitchen. latanoprost (XALATAN) 0.005 % ophthalmic solution Place 1 drop into both eyes at bedtime. wait 5 minutes between multiple eye drops  . naproxen (NAPROSYN) 500 MG tablet Take 1 tablet by mouth every 12 (twelve) hours as needed. for arthritis at the knee  . NON FORMULARY Diet: NAS,  . oxybutynin (DITROPAN-XL) 10 MG 24 hr tablet Take 10 mg by mouth daily.  .Marland Kitchen  pantoprazole (PROTONIX) 40 MG tablet Take 40 mg by mouth daily.  Marland Kitchen zinc oxide 20 % ointment Apply 1 application topically in the morning, at noon, and at bedtime. Apply to sacrum and bilateral buttocks every shift   No facility-administered encounter medications on file as of 06/16/2020.     SIGNIFICANT DIAGNOSTIC EXAMS   PREVIOUS  09-03-19: chest x-ray: No acute abnormality.   09-03-19: ct of abdomen and pelvis:  1. Wall thickening of the distal esophagus, suspicious foresophagitis or reflux. 2. Slight heterogeneous nephrograms of both kidneys, may representpyelonephritis in the setting of recent urinary tract infection. No focal fluid collection. 3. No CT findings of acute cholecystitis. 4. Trabecular coarsening of the left inferior pubic ramus and ischium, may be sequela of prior trauma or Paget's disease. Aortic Atherosclerosis   09-04-19: right ankle x-ray: 1. No fracture, bone lesion or ankle joint abnormality.2. No evidence of osteomyelitis.  02-05-20: DEXA: t score: -2.279  NO NEW EXAMS.   LABS REVIEWED PREVIOUS;   09-03-19: wbc 11.9; hgb 13.6; hct 43.6; mcv 83.8 plt 430; glucose 161; bun 28; creat 0.95 ;k+ 4.1; na++ 127; ca 9.1 liver normal albumin 3.9 urine culture: e-coli ESBL and 50,000 proteus mirabilis 09-08-19: wbc 6.1 hgb 10.6; hct 33.7; mcv 86.4 plt 389; glucose 100; bun 13; creat 0.54; k+ 4.2; na++ 134; ca 8.3 09-24-19: wbc 5.0; hgb 11.4; hct 37.2; mcv 88.2 plt 441;  glucose 137; bun 19; creat 0.59; k+ 4.3; na++ 136; ca 8.7 02-19-20: glucose 89; bun 23; creat 0.54; k+ 4.1 na++ 137; ca 8.9  03-11-20: k+ 3.9 04-08-20: wbc 6.4; hgb 9.6; hct 32.0; mcv 82.3 plt 372; glucose 91; bun 19; creat 0.56; k+ 3.6; na++ 138; ca 8.7 liver normal albumin 2.9  05-18-20: hgb 10.0; hct 32.9  NO NEW LABS.     Review of Systems  Unable to perform ROS: Dementia (unable to fully participate )    Physical Exam Constitutional:      General: She is not in acute distress.    Appearance: She is well-developed. She is obese. She is not diaphoretic.  Neck:     Thyroid: No thyromegaly.  Cardiovascular:     Rate and Rhythm: Normal rate and regular rhythm.     Pulses: Normal pulses.     Heart sounds: Normal heart sounds.  Pulmonary:     Effort: Pulmonary effort is normal. No respiratory distress.     Breath sounds: Normal breath sounds.  Abdominal:     General: Bowel sounds are normal. There is no distension.     Palpations: Abdomen is soft.     Tenderness: There is no abdominal tenderness.  Musculoskeletal:     Cervical back: Neck supple.     Right lower leg: No edema.     Left lower leg: No edema.     Comments:  Is able to move all extremities  History of right knee replacement      Right hand joints are slightly swollen without redness present.    Lymphadenopathy:     Cervical: No cervical adenopathy.  Skin:    General: Skin is warm and dry.  Neurological:     Mental Status: She is alert. Mental status is at baseline.  Psychiatric:        Mood and Affect: Mood normal.       ASSESSMENT/ PLAN:  TODAY  1. GERD without esophagitis 2. Dementia without behavioral disturbance unspecified dementia type 3. Essential hypertension.   Will continue current medications Will continue  current plan of care Will continue to monitor her status.   MD is aware of resident's narcotic use and is in agreement with current plan of care. We will attempt to wean resident as  appropriate.  Synthia Innocent NP [facility]  Contact [phone] Monday through Friday 8am- 5pm  After hours call [phone]

## 2020-06-28 ENCOUNTER — Non-Acute Institutional Stay (SKILLED_NURSING_FACILITY): Payer: Medicare HMO | Admitting: Adult Health

## 2020-06-28 ENCOUNTER — Encounter: Payer: Self-pay | Admitting: Adult Health

## 2020-06-28 DIAGNOSIS — M19042 Primary osteoarthritis, left hand: Secondary | ICD-10-CM

## 2020-06-28 NOTE — Progress Notes (Unsigned)
Location:    [facility] Room Number: 105/D Place of Service:  SNF (31)   CODE STATUS: Full Code  No Known Allergies  Chief Complaint  Patient presents with  . Acute Visit    Left Finger Pain    HPI:  She is complaining of left index finger pain. She states that she has increased difficulty with bending her finger at times. There is some swelling present. No warmth present. She has had this pain for the past several weeks. She has not tried anything to help with the pain.   Past Medical History:  Diagnosis Date  . Abnormal posture    Per [employer], [facility] Records [facility]   . Anemia   . Cognitive communication deficit    Per [employer], [facility]   . Contracture of left knee    Per [employer], [facility]   . Diabetes mellitus without complication (HCC)   . Difficulty walking    Per [employer], [facility]   . Extended spectrum beta lactamase (ESBL) resistance    Per [employer], [facility]   . Gastroesophageal reflux disease    Per [employer], [facility]   . Glaucoma   . Gram negative sepsis (HCC)    Per [employer], [facility]   . Hypercholesteremia   . Hypertension   . Hypokalemia   . Infection, bacterial    Per [employer], [facility] Records [facility]   . Muscle weakness (generalized)    Per [employer], [facility] Records [facility]   . Need for assistance with personal care    Per [employer], [facility]   . OAB (overactive bladder)    Per [employer], [facility]   . Ocular hypertension, bilateral    Per [employer], [facility]  [facility] Records [facility]   . Osteoarthritis of right knee    Per [employer], [facility]   . Paraplegia (HCC)   . Pyelonephritis, acute    Per [employer], [facility] Records [facility]   . Status post total right knee replacement   . Unspecified dementia without behavioral disturbance (HCC)    Per [employer], [facility]   . Urgency of urination    Per [employer], [facility]   . Wheelchair dependence    Per [employer], [facility] Records [facility]     Past Surgical History:  Procedure Laterality Date  . ABDOMINAL HYSTERECTOMY    . CATARACT EXTRACTION    . COLONOSCOPY N/A 12/10/2013   Procedure: COLONOSCOPY;  Surgeon: West Bali, MD;  Location: [facility];  Service: Endoscopy;  Laterality: N/A;  8:30 AM  . TOTAL KNEE ARTHROPLASTY Right 03/30/2016   Procedure: TOTAL KNEE ARTHROPLASTY;  Surgeon: Vickki Hearing, MD;  Location: [facility];  Service: Orthopedics;  Laterality: Right;    Social History   Socioeconomic History  . Marital status: Divorced    Spouse name: Not on file  . Number of children: Not on file  . Years of education: Not on file  . Highest education level: Not on file  Occupational History  . Occupation: retired   Tobacco Use  .  Smoking status: Current Some Day Smoker    Packs/day: 0.25    Years: 3.00    Pack years: 0.75    Types: Cigarettes  . Smokeless tobacco: Never Used  Vaping Use  . Vaping Use: Never used  Substance and Sexual Activity  . Alcohol use: No  . Drug use: No  . Sexual activity: Not Currently    Birth control/protection: Post-menopausal  Other Topics Concern  . Not on file  Social History Narrative   Long term resident of [facility]    Social Determinants of Health   Financial Resource Strain:   . Difficulty of Paying Living Expenses: Not on file  Food Insecurity:   .  Worried About Programme researcher, broadcasting/film/video in the Last Year: Not on file  . Ran Out of Food in the Last Year: Not on file  Transportation Needs:   . Lack of Transportation (Medical): Not on file  . Lack of Transportation (Non-Medical): Not on file  Physical Activity: Inactive  . Days of Exercise per Week: 0 days  . Minutes of Exercise per Session: 0 min  Stress:   . Feeling of Stress : Not on file  Social Connections:   . Frequency of Communication with Friends and Family: Not on file  . Frequency of Social Gatherings with Friends and Family: Not on file  . Attends Religious Services: Not on file  . Active Member of Clubs or Organizations: Not on file  . Attends Banker Meetings: Not on file  . Marital Status: Not on file  Intimate Partner Violence: Not At Risk  . Fear of Current or Ex-Partner: No  . Emotionally Abused: No  . Physically Abused: No  . Sexually Abused: No   Family History  Problem Relation Age of Onset  . Cancer Mother   . Arthritis Mother   . Hypotension Father   . Cancer Sister   . Hypotension Sister   . Colon cancer Neg Hx       VITAL SIGNS BP 132/73   Pulse 89   Temp (!) 97.3 F (36.3 C)   Ht 5\' 5"  (1.651 m)   Wt 198 lb (89.8 kg)   BMI 32.95 kg/m   Outpatient Encounter Medications as of 06/28/2020  Medication Sig  . acetaminophen (TYLENOL) 325 MG tablet Take 650 mg by mouth 3 (three) times daily.   14/12/2019 ascorbic acid (VITAMIN C) 500 MG tablet Take 500 mg by mouth daily.  Marland Kitchen aspirin EC 81 MG tablet Take 81 mg by mouth daily.  . calcium citrate (CALCITRATE - DOSED IN MG ELEMENTAL CALCIUM) 950 MG tablet Take 200 mg of elemental calcium by mouth daily.  . diclofenac Sodium (VOLTAREN) 1 % GEL Apply 4 g topically 3 (three) times daily. Special Instructions: apply tid to right knee  . diclofenac Sodium (VOLTAREN) 1 % GEL Apply 2 g topically in the morning and at bedtime. Special Instructions: apply to left index finger for arthritis pain  . iron  polysaccharides (FERREX 150) 150 MG capsule Take 1 capsule by mouth daily.  Marland Kitchen latanoprost (XALATAN) 0.005 % ophthalmic solution Place 1 drop into both eyes at bedtime. wait 5 minutes between multiple eye drops  . NON FORMULARY Diet: NAS,  . oxybutynin (DITROPAN-XL) 10 MG 24 hr tablet Take 10 mg by mouth daily.  . pantoprazole (PROTONIX) 40 MG tablet Take 40 mg by mouth daily.  Marland Kitchen zinc oxide 20 % ointment Apply 1 application topically in the morning, at noon, and at bedtime.  Apply to sacrum and bilateral buttocks every shift  . [DISCONTINUED] naproxen (NAPROSYN) 500 MG tablet Take 1 tablet by mouth every 12 (twelve) hours as needed. for arthritis at the knee   No facility-administered encounter medications on file as of 06/28/2020.     SIGNIFICANT DIAGNOSTIC EXAMS  PREVIOUS  09-03-19: chest x-ray: No acute abnormality.   09-03-19: ct of abdomen and pelvis:  1. Wall thickening of the distal esophagus, suspicious foresophagitis or reflux. 2. Slight heterogeneous nephrograms of both kidneys, may representpyelonephritis in the setting of recent urinary tract infection. No focal fluid collection. 3. No CT findings of acute cholecystitis. 4. Trabecular coarsening of the left inferior pubic ramus and ischium, may be sequela of prior trauma or Paget's disease. Aortic Atherosclerosis   09-04-19: right ankle x-ray: 1. No fracture, bone lesion or ankle joint abnormality.2. No evidence of osteomyelitis.  02-05-20: DEXA: t score: -2.279  NO NEW EXAMS.   LABS REVIEWED PREVIOUS;   09-03-19: wbc 11.9; hgb 13.6; hct 43.6; mcv 83.8 plt 430; glucose 161; bun 28; creat 0.95 ;k+ 4.1; na++ 127; ca 9.1 liver normal albumin 3.9 urine culture: e-coli ESBL and 50,000 proteus mirabilis 09-08-19: wbc 6.1 hgb 10.6; hct 33.7; mcv 86.4 plt 389; glucose 100; bun 13; creat 0.54; k+ 4.2; na++ 134; ca 8.3 09-24-19: wbc 5.0; hgb 11.4; hct 37.2; mcv 88.2 plt 441; glucose 137; bun 19; creat 0.59; k+ 4.3; na++ 136; ca 8.7  02-19-20: glucose 89; bun 23; creat 0.54; k+ 4.1 na++ 137; ca 8.9  03-11-20: k+ 3.9 04-08-20: wbc 6.4; hgb 9.6; hct 32.0; mcv 82.3 plt 372; glucose 91; bun 19; creat 0.56; k+ 3.6; na++ 138; ca 8.7 liver normal albumin 2.9  05-18-20: hgb 10.0; hct 32.9  NO NEW LABS.    Review of Systems  Unable to perform ROS: Dementia (unable to fully participate )  Musculoskeletal:       Has left index finger pain; with swelling present     Physical Exam Constitutional:      General: She is not in acute distress.    Appearance: She is well-developed. She is obese. She is not diaphoretic.  Neck:     Thyroid: No thyromegaly.  Cardiovascular:     Rate and Rhythm: Normal rate and regular rhythm.     Pulses: Normal pulses.     Heart sounds: Normal heart sounds.  Pulmonary:     Effort: Pulmonary effort is normal. No respiratory distress.     Breath sounds: Normal breath sounds.  Abdominal:     General: Bowel sounds are normal. There is no distension.     Palpations: Abdomen is soft.     Tenderness: There is no abdominal tenderness.  Musculoskeletal:     Cervical back: Neck supple.     Right lower leg: No edema.     Left lower leg: No edema.     Comments:  Is able to move all extremities  History of right knee replacement      Right hand joints are slightly swollen without redness present.    Left index finger is swollen has increased difficulty with binding her finger.   Lymphadenopathy:     Cervical: No cervical adenopathy.  Skin:    General: Skin is warm and dry.  Neurological:     Mental Status: She is alert. Mental status is at baseline.  Psychiatric:        Mood and Affect: Mood normal.       ASSESSMENT/ PLAN:  TODAY  1. Osteoarthritis left index finger.  Is worse will use voltaren gel 2 gm twice daily will monitor her status.     MD is aware of resident's narcotic use and is in agreement with current plan of care. We will attempt to wean resident as appropriate.  Synthia Innocent NP [facility]  Contact [phone] Monday through Friday 8am- 5pm  After hours call [phone]

## 2020-06-29 DIAGNOSIS — M19042 Primary osteoarthritis, left hand: Secondary | ICD-10-CM | POA: Insufficient documentation

## 2020-07-05 ENCOUNTER — Non-Acute Institutional Stay (SKILLED_NURSING_FACILITY): Payer: Medicare HMO | Admitting: Adult Health

## 2020-07-05 ENCOUNTER — Encounter: Payer: Self-pay | Admitting: Adult Health

## 2020-07-05 DIAGNOSIS — M1711 Unilateral primary osteoarthritis, right knee: Secondary | ICD-10-CM

## 2020-07-05 DIAGNOSIS — I10 Essential (primary) hypertension: Secondary | ICD-10-CM | POA: Diagnosis not present

## 2020-07-05 DIAGNOSIS — M19042 Primary osteoarthritis, left hand: Secondary | ICD-10-CM

## 2020-07-05 NOTE — Progress Notes (Signed)
Location:  [facility] Room Number: 105/D Place of Service:  SNF (31)   CODE STATUS: Full Code  No Known Allergies  Chief Complaint  Patient presents with   Medical Management of Chronic Issues          Increased intraocular pressure bilateral:    Primary osteoarthritis right knee/left index finger:  Essential hypertension:     HPI:  She is a 85 year old long term resident of this facility being seen for the management of her chronic illnesses: Increased intraocular pressure bilateral:    Primary osteoarthritis right knee/left index finger:  Essential hypertension. There are no reports of pain; no headaches; no reports of changes in appetite; her weight is stable.   Past Medical History:  Diagnosis Date   Abnormal posture    Per [employer], [facility] Records [facility]    Anemia    Cognitive communication deficit    Per [employer], [facility]    Contracture of left knee    Per [employer], [facility]    Diabetes mellitus without complication (HCC)    Difficulty walking    Per [employer], [facility]    Extended spectrum beta lactamase (ESBL) resistance    Per [employer], [facility]    Gastroesophageal reflux disease    Per [employer], [facility]    Glaucoma    Gram negative sepsis (HCC)    Per [employer], [facility]    Hypercholesteremia    Hypertension    Hypokalemia    Infection, bacterial    Per [employer], [facility] Records [facility]    Muscle weakness (generalized)    Per [employer], [facility]    Need for assistance with personal care    Per [employer], [facility] Records  [facility]    OAB (overactive bladder)    Per [employer], [facility]    Ocular hypertension, bilateral    Per [employer], [facility]    Osteoarthritis of right knee    Per [employer], [facility]    Paraplegia [facility])    Pyelonephritis, acute    Per [employer], [facility] Records [facility]    Status post total right knee replacement    Unspecified dementia without behavioral disturbance (HCC)    Per [employer], [facility]    Urgency of urination    Per [employer], [facility]    Wheelchair dependence    Per [employer], [facility] Records [facility]     Past Surgical History:  Procedure Laterality Date   ABDOMINAL HYSTERECTOMY     CATARACT EXTRACTION     COLONOSCOPY N/A 12/10/2013   Procedure: COLONOSCOPY;  Surgeon: West Bali, MD;  Location: [facility];  Service: Endoscopy;  Laterality: N/A;  8:30 AM   TOTAL KNEE ARTHROPLASTY Right 03/30/2016   Procedure: TOTAL KNEE ARTHROPLASTY;  Surgeon: Vickki Hearing, MD;  Location: [facility];  Service: Orthopedics;  Laterality: Right;    Social History   Socioeconomic History   Marital status: Divorced    Spouse name: Not on file   Number of children: Not on file  Years of education: Not on file   Highest education level: Not on file  Occupational History   Occupation: retired   Tobacco Use   Smoking status: Current Some Day Smoker    Packs/day: 0.25    Years: 3.00    Pack years: 0.75    Types: Cigarettes   Smokeless tobacco: Never Used  Building services engineer Use: Never used  Substance and Sexual Activity   Alcohol use: No   Drug use: No   Sexual activity: Not Currently    Birth control/protection: Post-menopausal  Other Topics Concern   Not on file  Social  History Narrative   Long term resident of [facility]    Social Determinants of Health   Financial Resource Strain: Not on file  Food Insecurity: Not on file  Transportation Needs: Not on file  Physical Activity: Inactive   Days of Exercise per Week: 0 days   Minutes of Exercise per Session: 0 min  Stress: Not on file  Social Connections: Not on file  Intimate Partner Violence: Not At Risk   Fear of Current or Ex-Partner: No   Emotionally Abused: No   Physically Abused: No   Sexually Abused: No   Family History  Problem Relation Age of Onset   Cancer Mother    Arthritis Mother    Hypotension Father    Cancer Sister    Hypotension Sister    Colon cancer Neg Hx       VITAL SIGNS BP (!) 159/71    Pulse (!) 108    Temp 98.5 F (36.9 C)    Ht 5\' 5"  (1.651 m)    Wt 198 lb (89.8 kg)    BMI 32.95 kg/m   Outpatient Encounter Medications as of 07/05/2020  Medication Sig   acetaminophen (TYLENOL) 325 MG tablet Take 650 mg by mouth 3 (three) times daily.   ascorbic acid (VITAMIN C) 500 MG tablet Take 500 mg by mouth daily.   aspirin EC 81 MG tablet Take 81 mg by mouth daily.   calcium citrate (CALCITRATE - DOSED IN MG ELEMENTAL CALCIUM) 950 MG tablet Take 200 mg of elemental calcium by mouth daily.   diclofenac Sodium (VOLTAREN) 1 % GEL Apply 4 g topically 3 (three) times daily. Special Instructions: apply tid to right knee   diclofenac Sodium (VOLTAREN) 1 % GEL Apply 2 g topically in the morning and at bedtime. Special Instructions: apply to left index finger for arthritis pain   iron polysaccharides (NIFEREX) 150 MG capsule Take 1 capsule by mouth daily.   latanoprost (XALATAN) 0.005 % ophthalmic solution Place 1 drop into both eyes at bedtime. wait 5 minutes between multiple eye drops   NON FORMULARY Diet: NAS,   oxybutynin (DITROPAN-XL) 10 MG 24 hr tablet Take 10 mg by mouth daily.   pantoprazole (PROTONIX) 40 MG tablet Take 40 mg by mouth daily.   zinc oxide  20 % ointment Apply 1 application topically in the morning, at noon, and at bedtime. Apply to sacrum and bilateral buttocks every shift   No facility-administered encounter medications on file as of 07/05/2020.     SIGNIFICANT DIAGNOSTIC EXAMS  PREVIOUS  09-03-19: chest x-ray: No acute abnormality.   09-03-19: ct of abdomen and pelvis:  1. Wall thickening of the distal esophagus, suspicious foresophagitis or reflux. 2. Slight heterogeneous nephrograms of both kidneys, may representpyelonephritis in the setting of recent urinary tract infection. No focal fluid collection. 3. No CT findings of acute cholecystitis. 4. Trabecular  coarsening of the left inferior pubic ramus and ischium, may be sequela of prior trauma or Paget's disease. Aortic Atherosclerosis   09-04-19: right ankle x-ray: 1. No fracture, bone lesion or ankle joint abnormality.2. No evidence of osteomyelitis.  02-05-20: DEXA: t score: -2.279  NO NEW EXAMS.   LABS REVIEWED PREVIOUS;   09-03-19: wbc 11.9; hgb 13.6; hct 43.6; mcv 83.8 plt 430; glucose 161; bun 28; creat 0.95 ;k+ 4.1; na++ 127; ca 9.1 liver normal albumin 3.9 urine culture: e-coli ESBL and 50,000 proteus mirabilis 09-08-19: wbc 6.1 hgb 10.6; hct 33.7; mcv 86.4 plt 389; glucose 100; bun 13; creat 0.54; k+ 4.2; na++ 134; ca 8.3 09-24-19: wbc 5.0; hgb 11.4; hct 37.2; mcv 88.2 plt 441; glucose 137; bun 19; creat 0.59; k+ 4.3; na++ 136; ca 8.7 02-19-20: glucose 89; bun 23; creat 0.54; k+ 4.1 na++ 137; ca 8.9  03-11-20: k+ 3.9 04-08-20: wbc 6.4; hgb 9.6; hct 32.0; mcv 82.3 plt 372; glucose 91; bun 19; creat 0.56; k+ 3.6; na++ 138; ca 8.7 liver normal albumin 2.9  05-18-20: hgb 10.0; hct 32.9   TODAY  05-30-20: uric acid: 4.8  Review of Systems  Constitutional: Negative for malaise/fatigue.  Respiratory: Negative for cough.   Cardiovascular: Negative for chest pain and leg swelling.  Gastrointestinal: Negative for abdominal pain and heartburn.  Musculoskeletal:  Negative for back pain, joint pain and myalgias.  Skin: Negative.   Neurological: Negative for dizziness.  Psychiatric/Behavioral: The patient is not nervous/anxious.    Physical Exam Constitutional:      General: She is not in acute distress.    Appearance: She is well-developed and well-nourished. She is obese. She is not diaphoretic.  Neck:     Thyroid: No thyromegaly.  Cardiovascular:     Rate and Rhythm: Normal rate and regular rhythm.     Pulses: Normal pulses and intact distal pulses.     Heart sounds: Normal heart sounds.  Pulmonary:     Effort: Pulmonary effort is normal. No respiratory distress.     Breath sounds: Normal breath sounds.  Abdominal:     General: Bowel sounds are normal. There is no distension.     Palpations: Abdomen is soft.     Tenderness: There is no abdominal tenderness.  Musculoskeletal:        General: No edema.     Cervical back: Neck supple.     Right lower leg: No edema.     Left lower leg: No edema.     Comments:   Is able to move all extremities  History of right knee replacement      Right hand joints are slightly swollen without redness present.    Left index finger is slightly swollen   Lymphadenopathy:     Cervical: No cervical adenopathy.  Skin:    General: Skin is warm and dry.  Neurological:     Mental Status: She is alert. Mental status is at baseline.  Psychiatric:        Mood and Affect: Mood and affect and mood normal.       ASSESSMENT/ PLAN:  TODAY;   1. Increased intraocular pressure bilateral: is stable will continue xalatan to both eyes   2. Primary osteoarthritis right knee/left index finger: is stable will continue tylenol 650 mg three times daily voltaren gel 4 gm to right knee three times daily and 2 gm to left index finger twice daily   3. Essential hypertension: is stable b/p 159/71 will continue asa 81 mg  daily   PREVIOUS  4. Chronic anemia: is without change hgb 10.0 will continue ferrex daily   5.  GERD without esophagitis is stable will continue protonix 40 mg daily  6. Urinary urgency is stable will continue ditropan xl 10 mg daily '  7. Hypokalemia: is stable k+ 3.6 will monitor  8. Vascular dementia without behavioral disturbance: is stable weight is 198 pounds will monitor         MD is aware of resident's narcotic use and is in agreement with current plan of care. We will attempt to wean resident as appropriate.  Synthia Innocenteborah Raylyn Carton NP [facility]  Contact [phone] Monday through Friday 8am- 5pm  After hours call [phone]

## 2020-08-10 ENCOUNTER — Encounter: Payer: Self-pay | Admitting: Adult Health

## 2020-08-10 ENCOUNTER — Non-Acute Institutional Stay (SKILLED_NURSING_FACILITY): Payer: Medicare HMO | Admitting: Adult Health

## 2020-08-10 DIAGNOSIS — D649 Anemia, unspecified: Secondary | ICD-10-CM

## 2020-08-10 DIAGNOSIS — I700 Atherosclerosis of aorta: Secondary | ICD-10-CM

## 2020-08-10 DIAGNOSIS — K219 Gastro-esophageal reflux disease without esophagitis: Secondary | ICD-10-CM

## 2020-08-10 DIAGNOSIS — R3915 Urgency of urination: Secondary | ICD-10-CM

## 2020-08-10 NOTE — Progress Notes (Signed)
Location:  [facility] Room Number: 105-D Place of Service:  SNF (31)   CODE STATUS: Full Code   No Known Allergies  Chief Complaint  Patient presents with  . Medical Management of Chronic Issues          Chronic anemia:   GERD without esophagitis:  Urinary urgency    HPI:  She is a 86 year old long term resident of this facility being seen for the management of her chronic illnesses: Chronic anemia:   GERD without esophagitis:  Urinary urgency. She denies any joint pain. Does have arthritic hands. Denies any urinary urgency; denies any heart burn.   Past Medical History:  Diagnosis Date  . Abnormal posture    Per [employer], [facility] Records [facility]   . Anemia   . Cognitive communication deficit    Per [employer], [facility]   . Contracture of left knee    Per [employer], [facility]   . Diabetes mellitus without complication (HCC)   . Difficulty walking    Per [employer], [facility]   . Extended spectrum beta lactamase (ESBL) resistance    Per [employer], [facility]   . Gastroesophageal reflux disease    Per [employer], [facility]   . Glaucoma   . Gram negative sepsis (HCC)    Per [employer], [facility]   . Hypercholesteremia   . Hypertension   . Hypokalemia   . Infection, bacterial    Per [employer], [facility]   . Muscle weakness (generalized)    Per [employer], [facility]   . Need for assistance with personal care    Per [employer], [facility]   . OAB (overactive bladder)    Per [employer], [facility]   . Ocular  hypertension, bilateral    Per [employer], [facility]   . Osteoarthritis of right knee    Per [employer], [facility]   . Paraplegia (HCC)   . Pyelonephritis, acute    Per [employer], [facility]   . Status post total right knee replacement   . Unspecified dementia without behavioral disturbance (HCC)    Per [employer], [facility]   . Urgency of urination    Per [employer], [facility]   . Wheelchair dependence    Per [employer], [facility] Records [facility]     Past Surgical History:  Procedure Laterality Date  . ABDOMINAL HYSTERECTOMY    . CATARACT EXTRACTION    . COLONOSCOPY N/A 12/10/2013   Procedure: COLONOSCOPY;  Surgeon: West Bali, MD;  Location: [facility];  Service: Endoscopy;  Laterality: N/A;  8:30 AM  . TOTAL KNEE ARTHROPLASTY Right 03/30/2016   Procedure: TOTAL KNEE ARTHROPLASTY;  Surgeon: Vickki Hearing, MD;  Location: [facility];  Service: Orthopedics;  Laterality: Right;    Social History   Socioeconomic History  . Marital status: Divorced    Spouse name: Not on file  . Number of children: Not on file  . Years of education: Not on file  . Highest education level:  Not on file  Occupational History  . Occupation: retired   Tobacco Use  . Smoking status: Current Some Day Smoker    Packs/day: 0.25    Years: 3.00    Pack years: 0.75    Types: Cigarettes  . Smokeless tobacco: Never Used  Vaping Use  . Vaping Use: Never used  Substance and Sexual Activity  . Alcohol use: No  . Drug use: No  . Sexual activity: Not Currently    Birth control/protection: Post-menopausal  Other Topics Concern  . Not on file  Social History Narrative   Long term resident of [facility]    Social Determinants of Health   Financial Resource Strain: Not on  file  Food Insecurity: Not on file  Transportation Needs: Not on file  Physical Activity: Inactive  . Days of Exercise per Week: 0 days  . Minutes of Exercise per Session: 0 min  Stress: Not on file  Social Connections: Not on file  Intimate Partner Violence: Not At Risk  . Fear of Current or Ex-Partner: No  . Emotionally Abused: No  . Physically Abused: No  . Sexually Abused: No   Family History  Problem Relation Age of Onset  . Cancer Mother   . Arthritis Mother   . Hypotension Father   . Cancer Sister   . Hypotension Sister   . Colon cancer Neg Hx       VITAL SIGNS BP 106/90   Pulse 72   Temp 98.1 F (36.7 C)   Resp 20   Ht 5\' 5"  (1.651 m)   Wt 199 lb 11.2 oz (90.6 kg)   SpO2 95%   BMI 33.23 kg/m   Outpatient Encounter Medications as of 08/10/2020  Medication Sig  . acetaminophen (TYLENOL) 325 MG tablet Take 650 mg by mouth 3 (three) times daily.  08/12/2020 ascorbic acid (VITAMIN C) 500 MG tablet Take 500 mg by mouth daily.  Marland Kitchen aspirin EC 81 MG tablet Take 81 mg by mouth daily.  . calcium citrate (CALCITRATE - DOSED IN MG ELEMENTAL CALCIUM) 950 MG tablet Take 200 mg of elemental calcium by mouth daily.  . diclofenac Sodium (VOLTAREN) 1 % GEL Apply 4 g topically 3 (three) times daily. Special Instructions: apply tid to right knee  . diclofenac Sodium (VOLTAREN) 1 % GEL Apply 2 g topically in the morning and at bedtime. Special Instructions: apply to left index finger for arthritis pain  . iron polysaccharides (NIFEREX) 150 MG capsule Take 1 capsule by mouth daily.  Marland Kitchen latanoprost (XALATAN) 0.005 % ophthalmic solution Place 1 drop into both eyes at bedtime. wait 5 minutes between multiple eye drops  . NON FORMULARY Diet: NAS,  . oxybutynin (DITROPAN-XL) 10 MG 24 hr tablet Take 10 mg by mouth daily.  . pantoprazole (PROTONIX) 40 MG tablet Take 40 mg by mouth daily.  Marland Kitchen zinc oxide 20 % ointment Apply 1 application topically in the morning, at noon, and at bedtime. Apply to  sacrum and bilateral buttocks every shift   No facility-administered encounter medications on file as of 08/10/2020.     SIGNIFICANT DIAGNOSTIC EXAMS   PREVIOUS  09-03-19: chest x-ray: No acute abnormality.   09-03-19: ct of abdomen and pelvis:  1. Wall thickening of the distal esophagus, suspicious foresophagitis or reflux. 2. Slight heterogeneous nephrograms of both kidneys, may representpyelonephritis in the setting of recent urinary tract infection. No focal fluid collection. 3. No CT findings of acute cholecystitis. 4. Trabecular coarsening of the left inferior pubic ramus  and ischium, may be sequela of prior trauma or Paget's disease. Aortic Atherosclerosis   09-04-19: right ankle x-ray: 1. No fracture, bone lesion or ankle joint abnormality.2. No evidence of osteomyelitis.  02-05-20: DEXA: t score: -2.279  NO NEW EXAMS.   LABS REVIEWED PREVIOUS;   09-03-19: wbc 11.9; hgb 13.6; hct 43.6; mcv 83.8 plt 430; glucose 161; bun 28; creat 0.95 ;k+ 4.1; na++ 127; ca 9.1 liver normal albumin 3.9 urine culture: e-coli ESBL and 50,000 proteus mirabilis 09-08-19: wbc 6.1 hgb 10.6; hct 33.7; mcv 86.4 plt 389; glucose 100; bun 13; creat 0.54; k+ 4.2; na++ 134; ca 8.3 09-24-19: wbc 5.0; hgb 11.4; hct 37.2; mcv 88.2 plt 441; glucose 137; bun 19; creat 0.59; k+ 4.3; na++ 136; ca 8.7 02-19-20: glucose 89; bun 23; creat 0.54; k+ 4.1 na++ 137; ca 8.9  03-11-20: k+ 3.9 04-08-20: wbc 6.4; hgb 9.6; hct 32.0; mcv 82.3 plt 372; glucose 91; bun 19; creat 0.56; k+ 3.6; na++ 138; ca 8.7 liver normal albumin 2.9  05-18-20: hgb 10.0; hct 32.9  05-30-20: uric acid: 4.8  NO NEW LABS   Review of Systems  Constitutional: Negative for malaise/fatigue.  Respiratory: Negative for cough and shortness of breath.   Cardiovascular: Negative for chest pain, palpitations and leg swelling.  Gastrointestinal: Negative for abdominal pain, constipation and heartburn.  Musculoskeletal: Negative for back pain, joint pain and  myalgias.  Skin: Negative.   Neurological: Negative for dizziness.  Psychiatric/Behavioral: The patient is not nervous/anxious.    Physical Exam Constitutional:      General: She is not in acute distress.    Appearance: She is well-developed and well-nourished. She is obese. She is not diaphoretic.  Neck:     Thyroid: No thyromegaly.  Cardiovascular:     Rate and Rhythm: Normal rate and regular rhythm.     Pulses: Normal pulses and intact distal pulses.     Heart sounds: Normal heart sounds.  Pulmonary:     Effort: Pulmonary effort is normal. No respiratory distress.     Breath sounds: Normal breath sounds.  Abdominal:     General: Bowel sounds are normal. There is no distension.     Palpations: Abdomen is soft.     Tenderness: There is no abdominal tenderness.  Musculoskeletal:        General: No edema.     Cervical back: Neck supple.     Right lower leg: No edema.     Left lower leg: No edema.     Comments: Is able to move all extremities  History of right knee replacement       Lymphadenopathy:     Cervical: No cervical adenopathy.  Skin:    General: Skin is warm and dry.  Neurological:     Mental Status: She is alert. Mental status is at baseline.  Psychiatric:        Mood and Affect: Mood and affect and mood normal.     ASSESSMENT/ PLAN:  TODAY;   1. Chronic anemia: is stable hgb 10.0; will continue ferrex daily   2. GERD without esophagitis: is stable continue protonix 40 mg daily   3. Urinary urgency is stable will continue ditropan xl 10 mg daily    PREVIOUS  4. Hypokalemia: is stable k+ 3.6 will monitor  5. Vascular dementia without behavioral disturbance: is stable weight is 198 pounds will monitor  6. Increased intraocular pressure bilateral: is stable will continue xalatan to both eyes   7. Primary osteoarthritis right knee/left  index finger: is stable will continue tylenol 650 mg three times daily voltaren gel 4 gm to right knee three times  daily and 2 gm to left index finger twice daily   8. Essential hypertension: is stable b/p 106/90 will continue asa 81 mg daily   9. Aortic atherosclerosis: (CT 09-03-19) will monitor      MD is aware of resident's narcotic use and is in agreement with current plan of care. We will attempt to wean resident as appropriate.  Synthia Innocenteborah Kadija Cruzen NP [facility]  Contact [phone] Monday through Friday 8am- 5pm  After hours call [phone]

## 2020-08-16 DIAGNOSIS — I700 Atherosclerosis of aorta: Secondary | ICD-10-CM | POA: Insufficient documentation

## 2020-09-07 ENCOUNTER — Non-Acute Institutional Stay (SKILLED_NURSING_FACILITY): Payer: Medicare HMO | Admitting: Adult Health

## 2020-09-07 ENCOUNTER — Encounter: Payer: Self-pay | Admitting: Adult Health

## 2020-09-07 DIAGNOSIS — E876 Hypokalemia: Secondary | ICD-10-CM

## 2020-09-07 DIAGNOSIS — M1711 Unilateral primary osteoarthritis, right knee: Secondary | ICD-10-CM

## 2020-09-07 DIAGNOSIS — N3281 Overactive bladder: Secondary | ICD-10-CM

## 2020-09-07 DIAGNOSIS — D649 Anemia, unspecified: Secondary | ICD-10-CM

## 2020-09-07 DIAGNOSIS — H40053 Ocular hypertension, bilateral: Secondary | ICD-10-CM

## 2020-09-07 DIAGNOSIS — I10 Essential (primary) hypertension: Secondary | ICD-10-CM

## 2020-09-07 DIAGNOSIS — F0390 Unspecified dementia without behavioral disturbance: Secondary | ICD-10-CM

## 2020-09-07 DIAGNOSIS — I700 Atherosclerosis of aorta: Secondary | ICD-10-CM | POA: Diagnosis not present

## 2020-09-07 DIAGNOSIS — M19042 Primary osteoarthritis, left hand: Secondary | ICD-10-CM

## 2020-09-07 DIAGNOSIS — K219 Gastro-esophageal reflux disease without esophagitis: Secondary | ICD-10-CM

## 2020-09-07 NOTE — Progress Notes (Signed)
Provider: Sharee Holster, NP Location   [facility]  PCP: Sharee Holster, NP  Extended Emergency Contact Information Primary Emergency Contact: [name], [place] [address] [place] Home Phone: [phone] Relation: Relative Secondary Emergency Contact: [name], [place] [address] [place] of [place] Home Phone: [phone] Relation: Relative  Codes status: Full Code  Goals of care: advanced directive information Advanced Directives 09/07/2020  Does Patient Have a Medical Advance Directive? No  Type of Advance Directive -  Does patient want to make changes to medical advance directive? No - Patient declined  Copy of Healthcare Power of Attorney in Chart? -  Would patient like information on creating a medical advance directive? -  Pre-existing out of facility DNR order (yellow form or pink MOST form) -     No Known Allergies  Chief Complaint  Patient presents with  . Annual Exam    Yearly exam.      HPI  She is a 86 year old long term resident of this facility being seen for her annual exam. She has not required any hospitalizations or trips to the ED over the past year. She has not had any recent falls. She does have chronic arthritic pain in her hands. She denies any loss of appetite. She has gained 10 pounds over the past year. She denies any anxiety or depressive thoughts. She continues to socialize with others. She does get out of bed daily; does propel herself in wheelchair. She continues to be followed for her chronic illnesses including: Vascular dementia without behavioral disturbance:   Aortic atherosclerosis: Hypokalemia    Past Medical History:  Diagnosis Date  . Abnormal posture    Per [employer], [facility] Electronic Medical Records System   . Anemia   . Cognitive communication deficit    Per [employer], [facility] Records [facility]   . Contracture of left  knee    Per [employer], [facility] Records [facility]   . Diabetes mellitus without complication (HCC)   . Difficulty walking    Per [employer], [facility] Records [facility]   . Extended spectrum beta lactamase (ESBL) resistance    Per [employer], [facility] Records [facility]   . Gastroesophageal reflux disease    Per [employer], [facility] Records [facility]   . Glaucoma   . Gram negative sepsis (HCC)    Per [employer], [facility] Electronic Medical Records [facility]   . Hypercholesteremia   . Hypertension   . Hypokalemia   . Infection, bacterial    Per [employer], [facility] Electronic Medical Records [facility]   . Muscle weakness (generalized)    Per [employer], [facility] Records [facility]   . Need for assistance with personal care    Per [employer], [facility] Records [facility]   . OAB (overactive bladder)    Per [employer], [facility] Records [facility]   . Ocular hypertension, bilateral    Per [employer], [facility] Records [facility]   . Osteoarthritis of right knee    Per [employer], [facility] Records [facility]   . Paraplegia (HCC)   . Pyelonephritis, acute    Per [employer], [facility] Records [facility]   . Status post total right knee  replacement   . Unspecified dementia without behavioral disturbance (HCC)    Per [employer], [facility] Electronic [facility] Records [facility]   . Urgency of urination    Per [employer], [facility] Records [facility]   . Wheelchair dependence    Per [employer], [facility] Electronic Medical Records System    Past Surgical History:  Procedure Laterality Date  . ABDOMINAL HYSTERECTOMY    . CATARACT EXTRACTION    . COLONOSCOPY N/A 12/10/2013   Procedure: COLONOSCOPY;   Surgeon: West Bali, MD;  [facility]: [facility];  Service: Endoscopy;  Laterality: N/A;  8:30 AM  . TOTAL KNEE ARTHROPLASTY Right 03/30/2016   Procedure: TOTAL KNEE ARTHROPLASTY;  Surgeon: Vickki Hearing, MD;  Location: [facility];  Service: Orthopedics;  Laterality: Right;    reports that she has been smoking cigarettes. She has a 0.75 pack-year smoking history. She has never used smokeless tobacco. She reports that she does not drink alcohol and does not use drugs. Social History   Tobacco Use  . Smoking status: Current Some Day Smoker    Packs/day: 0.25    Years: 3.00    Pack years: 0.75    Types: Cigarettes  . Smokeless tobacco: Never Used  Vaping Use  . Vaping Use: Never used  Substance Use Topics  . Alcohol use: No  . Drug use: No   Family History  Problem Relation Age of Onset  . Cancer Mother   . Arthritis Mother   . Hypotension Father   . Cancer Sister   . Hypotension Sister   . Colon cancer Neg Hx     Pertinent  Health Maintenance Due  Topic Date Due  . PNA vac Low Risk Adult (1 of 2 - PCV13) 06/08/2021 (Originally 09/18/2001)  . INFLUENZA VACCINE  Completed  . DEXA SCAN  Completed   Fall Risk  02/17/2020  Falls in the past year? 0  Risk for fall due to : Impaired balance/gait;Impaired mobility   Depression screen [facility] 2/9 02/17/2020  Decreased Interest 0  Down, Depressed, Hopeless 0  PHQ - 2 Score 0    Functional Status Survey:    Outpatient Encounter Medications as of 09/07/2020  Medication Sig  . acetaminophen (TYLENOL) 325 MG tablet Take 650 mg by mouth 3 (three) times daily.  Marland Kitchen ascorbic acid (VITAMIN C) 500 MG tablet Take 500 mg by mouth daily.  Marland Kitchen aspirin EC 81 MG tablet Take 81 mg by mouth daily.  . calcium citrate (CALCITRATE - DOSED IN MG ELEMENTAL CALCIUM) 950 MG tablet Take 200 mg of elemental calcium by mouth daily.  . diclofenac Sodium (VOLTAREN) 1 % GEL Apply 4 g topically 3 (three) times daily. Special Instructions: apply tid to right knee   . diclofenac Sodium (VOLTAREN) 1 % GEL Apply 2 g topically in the morning and at bedtime. Special Instructions: apply to left index finger for arthritis pain  . iron polysaccharides (NIFEREX) 150 MG capsule Take 1 capsule by mouth daily.  Marland Kitchen latanoprost (XALATAN) 0.005 % ophthalmic solution Place 1 drop into both eyes at bedtime. wait 5 minutes between multiple eye drops  . NON FORMULARY Diet: NAS,  . oxybutynin (DITROPAN-XL) 10 MG 24 hr tablet Take 10 mg by mouth daily.  . pantoprazole (PROTONIX) 40 MG tablet Take 40 mg by mouth daily.  Marland Kitchen zinc oxide 20 % ointment Apply 1 application topically in the morning, at noon, and at bedtime. Apply to sacrum and bilateral buttocks every shift  No facility-administered encounter medications on file as of 09/07/2020.     Vitals:   09/07/20 1206  BP: 104/68  Pulse: (!) 108  Resp: 20  Temp: 97.6 F (36.4 C)  SpO2: 95%  Weight: 204 lb 6.4 oz (92.7 kg)  Height: 5\' 5"  (1.651 m)   Body mass index is 34.01 kg/m.  DIAGNOSTIC EXAMS   PREVIOUS  09-03-19: chest x-ray: No acute abnormality.   09-03-19: ct of abdomen and pelvis:  1. Wall thickening of the distal esophagus, suspicious foresophagitis or reflux. 2. Slight heterogeneous nephrograms of both kidneys, may representpyelonephritis in the setting of recent urinary tract infection. No focal fluid collection. 3. No CT findings of acute cholecystitis. 4. Trabecular coarsening of the left inferior pubic ramus and ischium, may be sequela of prior trauma or Paget's disease. Aortic Atherosclerosis   09-04-19: right ankle x-ray: 1. No fracture, bone lesion or ankle joint abnormality.2. No evidence of osteomyelitis.  02-05-20: DEXA: t score: -2.279  NO NEW EXAMS.   LABS REVIEWED PREVIOUS;   09-03-19: wbc 11.9; hgb 13.6; hct 43.6; mcv 83.8 plt 430; glucose 161; bun 28; creat 0.95 ;k+ 4.1; na++ 127; ca 9.1 liver normal albumin 3.9 urine culture: e-coli ESBL and 50,000 proteus mirabilis 09-08-19: wbc  6.1 hgb 10.6; hct 33.7; mcv 86.4 plt 389; glucose 100; bun 13; creat 0.54; k+ 4.2; na++ 134; ca 8.3 09-24-19: wbc 5.0; hgb 11.4; hct 37.2; mcv 88.2 plt 441; glucose 137; bun 19; creat 0.59; k+ 4.3; na++ 136; ca 8.7 02-19-20: glucose 89; bun 23; creat 0.54; k+ 4.1 na++ 137; ca 8.9  03-11-20: k+ 3.9 04-08-20: wbc 6.4; hgb 9.6; hct 32.0; mcv 82.3 plt 372; glucose 91; bun 19; creat 0.56; k+ 3.6; na++ 138; ca 8.7 liver normal albumin 2.9  05-18-20: hgb 10.0; hct 32.9  05-30-20: uric acid: 4.8  NO NEW LABS   Review of Systems  Constitutional: Negative for malaise/fatigue.  Respiratory: Negative for cough and shortness of breath.   Cardiovascular: Negative for chest pain, palpitations and leg swelling.  Gastrointestinal: Negative for abdominal pain, constipation and heartburn.  Musculoskeletal: Positive for joint pain. Negative for back pain and myalgias.       Bilateral hand arthritis  Skin: Negative.   Neurological: Negative for dizziness.  Psychiatric/Behavioral: The patient is not nervous/anxious.     Physical Exam Constitutional:      General: She is not in acute distress.    Appearance: She is well-developed and well-nourished. She is obese. She is not diaphoretic.  HENT:     Right Ear: Tympanic membrane normal.     Left Ear: Tympanic membrane normal.     Nose: Nose normal.     Mouth/Throat:     Mouth: Mucous membranes are moist.     Pharynx: Oropharynx is clear.  Eyes:     Conjunctiva/sclera: Conjunctivae normal.  Neck:     Thyroid: No thyromegaly.  Cardiovascular:     Rate and Rhythm: Normal rate and regular rhythm.     Pulses: Normal pulses and intact distal pulses.     Heart sounds: Normal heart sounds.  Pulmonary:     Effort: Pulmonary effort is normal. No respiratory distress.     Breath sounds: Normal breath sounds.  Abdominal:     General: Bowel sounds are normal. There is no distension.     Palpations: Abdomen is soft.     Tenderness: There is no abdominal tenderness.   Musculoskeletal:        General: No edema.  Cervical back: Neck supple.     Right lower leg: No edema.     Left lower leg: No edema.     Comments:  Is able to move all extremities  History of right knee replacement    Lymphadenopathy:     Cervical: No cervical adenopathy.  Skin:    General: Skin is warm and dry.  Neurological:     Mental Status: She is alert. Mental status is at baseline.  Psychiatric:        Mood and Affect: Mood and affect and mood normal.     ASSESSMENT/ PLAN:  TODAY;   1. Vascular dementia without behavioral disturbance: is without change weight is 204 pounds will monitor   2. Aortic atherosclerosis: (ct 09-03-19) will monitor  3. Hypokalemia: is stable k+ 3.6 will monitor \  4. Increased intraocular pressure bilateral: is stable will continue xalatan to both eyes  5. Primary osteoarthritis right knee; left index finger: is stable will continue tylenol 650 mg three times daily voltaren gel 4 gm to right knee three times daily and left index finger 2 gm twice daily   6. Essential hypertension: is stable b/p 104/68 will continue asa 81 mg daily   7. Chronic anemia: is stable hgb 10.0; will continue ferrex daily  8. GERD without esophagitis: is stable will continue protonix 40 mg daily   9. Urinary urgency: is stable will continue ditropan xl 10 mg daily         MD is aware of resident's narcotic use and is in agreement with current plan of care. We will attempt to wean resident as appropriate.  Synthia Innocenteborah Kate Sweetman NP [facility]  Contact [phone] Monday through Friday 8am- 5pm  After hours call [phone]

## 2020-09-09 ENCOUNTER — Non-Acute Institutional Stay (SKILLED_NURSING_FACILITY): Payer: Medicare HMO | Admitting: Adult Health

## 2020-09-09 ENCOUNTER — Encounter: Payer: Self-pay | Admitting: Adult Health

## 2020-09-09 DIAGNOSIS — F0390 Unspecified dementia without behavioral disturbance: Secondary | ICD-10-CM

## 2020-09-09 DIAGNOSIS — I700 Atherosclerosis of aorta: Secondary | ICD-10-CM | POA: Diagnosis not present

## 2020-09-09 DIAGNOSIS — D649 Anemia, unspecified: Secondary | ICD-10-CM

## 2020-09-09 NOTE — Progress Notes (Signed)
Location:  [facility] Room Number: 105/D Place of Service:  SNF (31)   CODE STATUS: Full Code  No Known Allergies  Chief Complaint  Patient presents with  . Acute Visit    Care Plan Meeting     HPI:  We have come together for her care plan meeting. BIMS 11/15 mood 3/30.  she requires extensive assist to dependent for her adls. She is able to feed herself after setup. She is incontinent of bladder and bowel. There have been no falls. Her weight is 204.4 pounds with a 15 pound gain in 7 months. Has a good appetite 75-100% of meals.  She does have a right knee contracture in bed.there are no reports of uncontrolled pain present. She continues to be followed for her chronic illnesses including: Aortic atherosclerosis  Dementia without behavioral disturbance unspecified dementia type Chronic anemia:  Past Medical History:  Diagnosis Date  . Abnormal posture    Per [employer], [facility] Electronic Medical Records System   . Anemia   . Cognitive communication deficit    Per [employer], [facility] Records [facility]   . Contracture of left knee    Per [employer], [facility] Records [facility]   . Diabetes mellitus without complication (HCC)   . Difficulty walking    Per [employer], [facility] Records [facility]   . Extended spectrum beta lactamase (ESBL) resistance    Per [employer], [facility] Medical Records [facility]   . Gastroesophageal reflux disease    Per [employer], [facility] Records [facility]   . Glaucoma   . Gram negative sepsis (HCC)    Per [employer], [facility] Electronic Medical Records [facility]   . Hypercholesteremia   . Hypertension   . Hypokalemia   . Infection, bacterial    Per [employer], [facility] Electronic Medical Records [facility]   . Muscle weakness (generalized)    Per [employer], [facility] Electronic  Medical Records System   . Need for assistance with personal care    Per [employer], [facility] Medical Records [facility]   . OAB (overactive bladder)    Per [employer], [facility] Records [facility]   . Ocular hypertension, bilateral    Per [employer], [facility] Medical Records [facility]   . Osteoarthritis of right knee    Per [employer], [facility] Records [facility]   . Paraplegia (HCC)   . Pyelonephritis, acute    Per [employer], [facility] Records [facility]   . Status post total right knee replacement   . Unspecified dementia without behavioral disturbance (HCC)    Per [employer], [facility] Electronic Medical Records [facility]   . Urgency of urination    Per [employer], [facility] Records [facility]   . Wheelchair dependence    Per [employer], [facility] Electronic Medical Records System     Past Surgical History:  Procedure Laterality Date  . ABDOMINAL HYSTERECTOMY    . CATARACT EXTRACTION    . COLONOSCOPY N/A 12/10/2013   Procedure: COLONOSCOPY;  Surgeon: West Bali, MD;  [facility];  Service: Endoscopy;  Laterality: N/A;  8:30 AM  . TOTAL KNEE ARTHROPLASTY Right 03/30/2016   Procedure: TOTAL KNEE ARTHROPLASTY;  Surgeon: Vickki Hearing, MD;  Location: [facility];  Service: Orthopedics;  Laterality: Right;    Social History  Socioeconomic History  . Marital status: Divorced    Spouse name: Not on file  . Number of children: Not on file  . Years of education: Not on file  . Highest education level: Not on file  Occupational History  . Occupation: retired   Tobacco Use  . Smoking status: Current Some Day Smoker    Packs/day: 0.25    Years: 3.00    Pack years: 0.75    Types: Cigarettes  . Smokeless tobacco: Never Used  Vaping Use  . Vaping Use: Never used  Substance and Sexual Activity  . Alcohol use: No  . Drug use:  No  . Sexual activity: Not Currently    Birth control/protection: Post-menopausal  Other Topics Concern  . Not on file  Social History Narrative   [facility]    Social Determinants of Health   Financial Resource Strain: Not on file  Food Insecurity: Not on file  Transportation Needs: Not on file  Physical Activity: Inactive  . Days of Exercise per Week: 0 days  . Minutes of Exercise per Session: 0 min  Stress: Not on file  Social Connections: Not on file  Intimate Partner Violence: Not At Risk  . Fear of Current or Ex-Partner: No  . Emotionally Abused: No  . Physically Abused: No  . Sexually Abused: No   Family History  Problem Relation Age of Onset  . Cancer Mother   . Arthritis Mother   . Hypotension Father   . Cancer Sister   . Hypotension Sister   . Colon cancer Neg Hx       VITAL SIGNS BP 104/68   Pulse 88   Temp 97.9 F (36.6 C)   Ht 5\' 5"  (1.651 m)   Wt 204 lb 6.4 oz (92.7 kg)   BMI 34.01 kg/m   Outpatient Encounter Medications as of 09/09/2020  Medication Sig  . acetaminophen (TYLENOL) 325 MG tablet Take 650 mg by mouth 3 (three) times daily.  Marland Kitchen. ascorbic acid (VITAMIN C) 500 MG tablet Take 500 mg by mouth daily.  Marland Kitchen. aspirin EC 81 MG tablet Take 81 mg by mouth daily.  . calcium citrate (CALCITRATE - DOSED IN MG ELEMENTAL CALCIUM) 950 MG tablet Take 200 mg of elemental calcium by mouth daily.  . diclofenac Sodium (VOLTAREN) 1 % GEL Apply 4 g topically 3 (three) times daily as needed. Special Instructions: apply tid to right knee  . diclofenac Sodium (VOLTAREN) 1 % GEL Apply 2 g topically in the morning and at bedtime. Special Instructions: apply to left index finger for arthritis pain  . iron polysaccharides (NIFEREX) 150 MG capsule Take 1 capsule by mouth daily.  Marland Kitchen. latanoprost (XALATAN) 0.005 % ophthalmic solution Place 1 drop into both eyes at bedtime. wait 5 minutes between multiple eye drops  . NON FORMULARY Diet: NAS,  . oxybutynin  (DITROPAN-XL) 10 MG 24 hr tablet Take 10 mg by mouth daily.  . pantoprazole (PROTONIX) 40 MG tablet Take 40 mg by mouth daily.  Marland Kitchen. zinc oxide 20 % ointment Apply 1 application topically in the morning, at noon, and at bedtime. Apply to sacrum and bilateral buttocks every shift   No facility-administered encounter medications on file as of 09/09/2020.     SIGNIFICANT DIAGNOSTIC EXAMS   PREVIOUS  09-03-19: chest x-ray: No acute abnormality.   09-03-19: ct of abdomen and pelvis:  1. Wall thickening of the distal esophagus, suspicious foresophagitis or reflux. 2. Slight heterogeneous nephrograms of both kidneys,  may representpyelonephritis in the setting of recent urinary tract infection. No focal fluid collection. 3. No CT findings of acute cholecystitis. 4. Trabecular coarsening of the left inferior pubic ramus and ischium, may be sequela of prior trauma or Paget's disease. Aortic Atherosclerosis   09-04-19: right ankle x-ray: 1. No fracture, bone lesion or ankle joint abnormality.2. No evidence of osteomyelitis.  02-05-20: DEXA: t score: -2.279  NO NEW EXAMS.   LABS REVIEWED PREVIOUS;   09-03-19: wbc 11.9; hgb 13.6; hct 43.6; mcv 83.8 plt 430; glucose 161; bun 28; creat 0.95 ;k+ 4.1; na++ 127; ca 9.1 liver normal albumin 3.9 urine culture: e-coli ESBL and 50,000 proteus mirabilis 09-08-19: wbc 6.1 hgb 10.6; hct 33.7; mcv 86.4 plt 389; glucose 100; bun 13; creat 0.54; k+ 4.2; na++ 134; ca 8.3 09-24-19: wbc 5.0; hgb 11.4; hct 37.2; mcv 88.2 plt 441; glucose 137; bun 19; creat 0.59; k+ 4.3; na++ 136; ca 8.7 02-19-20: glucose 89; bun 23; creat 0.54; k+ 4.1 na++ 137; ca 8.9  03-11-20: k+ 3.9 04-08-20: wbc 6.4; hgb 9.6; hct 32.0; mcv 82.3 plt 372; glucose 91; bun 19; creat 0.56; k+ 3.6; na++ 138; ca 8.7 liver normal albumin 2.9  05-18-20: hgb 10.0; hct 32.9  05-30-20: uric acid: 4.8  NO NEW LABS   Review of Systems  Constitutional: Negative for malaise/fatigue.  Respiratory: Negative for cough  and shortness of breath.   Cardiovascular: Negative for chest pain, palpitations and leg swelling.  Gastrointestinal: Negative for abdominal pain, constipation and heartburn.  Musculoskeletal: Negative for back pain, joint pain and myalgias.  Skin: Negative.   Neurological: Negative for dizziness.  Psychiatric/Behavioral: The patient is not nervous/anxious.     Physical Exam Constitutional:      General: She is not in acute distress.    Appearance: She is well-developed and well-nourished. She is obese. She is not diaphoretic.  Neck:     Thyroid: No thyromegaly.  Cardiovascular:     Rate and Rhythm: Normal rate and regular rhythm.     Pulses: Normal pulses and intact distal pulses.     Heart sounds: Normal heart sounds.  Pulmonary:     Effort: Pulmonary effort is normal. No respiratory distress.     Breath sounds: Normal breath sounds.  Abdominal:     General: Bowel sounds are normal. There is no distension.     Palpations: Abdomen is soft.     Tenderness: There is no abdominal tenderness.  Musculoskeletal:        General: No edema.     Cervical back: Neck supple.     Right lower leg: No edema.     Left lower leg: No edema.     Comments:  Is able to move all extremities  History of right knee replacement with contracture   Lymphadenopathy:     Cervical: No cervical adenopathy.  Skin:    General: Skin is warm and dry.  Neurological:     Mental Status: She is alert. Mental status is at baseline.  Psychiatric:        Mood and Affect: Mood and affect and mood normal.       ASSESSMENT/ PLAN:  TODAY  1. Aortic atherosclerosis 2. Dementia without behavioral disturbance unspecified dementia type 3. Chronic anemia:   Will continue current medications Will continue current plan of care Will continue to monitor her status.   MD is aware of resident's narcotic use and is in agreement with current plan of care. We will attempt to wean resident  as appropriate.  Synthia Innocent NP [facility]  Contact [phone] Monday through Friday 8am- 5pm  After hours call [phone]

## 2020-10-14 ENCOUNTER — Non-Acute Institutional Stay (SKILLED_NURSING_FACILITY): Payer: Medicare HMO | Admitting: Internal Medicine

## 2020-10-14 ENCOUNTER — Encounter: Payer: Self-pay | Admitting: Internal Medicine

## 2020-10-14 DIAGNOSIS — E441 Mild protein-calorie malnutrition: Secondary | ICD-10-CM

## 2020-10-14 DIAGNOSIS — D649 Anemia, unspecified: Secondary | ICD-10-CM

## 2020-10-14 DIAGNOSIS — L80 Vitiligo: Secondary | ICD-10-CM | POA: Insufficient documentation

## 2020-10-14 DIAGNOSIS — I10 Essential (primary) hypertension: Secondary | ICD-10-CM

## 2020-10-14 DIAGNOSIS — E46 Unspecified protein-calorie malnutrition: Secondary | ICD-10-CM | POA: Insufficient documentation

## 2020-10-14 NOTE — Assessment & Plan Note (Addendum)
Minimal elevation in the diastolic blood pressure.  No change indicated unless this is consistent or progressive.  Presently she is on no antihypertensives.  It is to be noted that her renal function is stage II with creatinine of 0.56 and GFR greater than 60.

## 2020-10-14 NOTE — Assessment & Plan Note (Signed)
Current albumin 2.9 with low normal total protein of 6.6.  Nutritionist will continue to follow at SNF.

## 2020-10-14 NOTE — Patient Instructions (Signed)
See assessment and plan under each diagnosis in the problem list and acutely for this visit 

## 2020-10-14 NOTE — Assessment & Plan Note (Addendum)
10/14/2020 this is her only concern and she is seeking treatment such as a cream to reverse it.  I told her that unfortunately typically it is a permanent condition. Up to Date did list therapies such as topical steroids & vit D if low. Vitamin D level will be checked.

## 2020-10-14 NOTE — Progress Notes (Signed)
NURSING HOME LOCATION:   [facility] ROOM NUMBER: 105/D   CODE STATUS:  Full Code  PCP: Sharee Holster, NP   This is a nursing facility follow up visit of chronic medical diagnoses & to document compliance with Regulation 483.30 (c) in The Long Term Care Survey Manual Phase 2 which mandates caregiver visit ( visits can alternate among physician, PA or NP as per statutes) within 10 days of 30 days / 60 days/ 90 days post admission to SNF date    Interim medical record and care since last SNF visit was updated with review of diagnostic studies and change in clinical status since last visit were documented.  HPI: She is a permanent resident of facility with medical diagnoses of urge incontinence/OAB, dementia without behavioral disturbance, paraplegia, essential hypertension, dyslipidemia, glaucoma, GERD, Surgeries and procedures include TKA and abdominal hysterectomy. Family history reviewed; it is noncontributory due to her advanced age. She has not drunk alcohol; she has an insignificant history of smoking.  Review of systems: Dementia invalidated responses. Date given as 2021 and president as" Bane". Her chief concern is faint vitiligo over the dorsum of the hands.  She cannot tell me when this began or whether there was any association with any infection or topical exposures. She describes arthritis in her hands. In reference to her anemia she denies any bleeding dyscrasias.  In reference to renal function; she states that she was formally "peeing a lot" but this resolved with cranberry juice. The remainder of the review of systems was negative.  Constitutional: No fever, significant weight change, fatigue  Eyes: No redness, discharge, pain, vision change ENT/mouth: No nasal congestion,  purulent discharge, earache, change in hearing, sore throat  Cardiovascular: No chest pain, palpitations, paroxysmal nocturnal dyspnea,  edema  Respiratory: No cough, sputum  production, hemoptysis, significant snoring, apnea   Gastrointestinal: No heartburn, dysphagia, abdominal pain, nausea /vomiting, rectal bleeding, melena, change in bowels Genitourinary: No dysuria, hematuria, pyuria, incontinence, nocturia Dermatologic: No rash, pruritus Neurologic: No dizziness, headache, syncope, seizures, numbness, tingling Psychiatric: No significant anxiety, depression, insomnia, anorexia Endocrine: No change in hair/skin/nails, excessive thirst, excessive hunger, excessive urination  Hematologic/lymphatic: No significant bruising, lymphadenopathy, abnormal bleeding Allergy/immunology: No itchy/watery eyes, significant sneezing, urticaria, angioedema  Physical exam:  Pertinent or positive findings: She is edentulous but does not wear dentures.  She has decreased pedal pulses.  There is trace edema at the sock line.  She has isolated flexion contractures of fingers of the hands, greater on the right.  There is a marked partial flexion contracture of the right knee.  The right knee is fusiformly enlarged and there is suggestion of effusion.  There is very faint several speckled vitiligo over the dorsum of the hands.  General appearance: Adequately nourished; no acute distress, increased work of breathing is present.   Lymphatic: No lymphadenopathy about the head, neck, axilla. Eyes: No conjunctival inflammation or lid edema is present. There is no scleral icterus. Ears:  External ear exam shows no significant lesions or deformities.   Nose:  External nasal examination shows no deformity or inflammation. Nasal mucosa are pink and moist without lesions, exudates Oral exam:  Lips and gums are healthy appearing. There is no oropharyngeal erythema or exudate. Neck:  No thyromegaly, masses, tenderness noted.    Heart:  Normal rate and regular rhythm. S1 and S2 normal without gallop, murmur, click, rub .  Lungs: Chest clear to auscultation without wheezes, rhonchi, rales,  rubs. Abdomen: Bowel sounds  are normal. Abdomen is soft and nontender with no organomegaly, hernias, masses. GU: Deferred  Extremities:  No cyanosis, clubbing, edema  Neurologic exam :Balance, Rhomberg, finger to nose testing could not be completed due to clinical state Skin: Warm & dry w/o tenting. No significant rash.  See summary under each active problem in the Problem List with associated updated therapeutic plan

## 2020-10-14 NOTE — Assessment & Plan Note (Signed)
Anemia is stable to improved with most recent H/H of 10/32.9.  No bleeding dyscrasias reported.

## 2020-10-20 ENCOUNTER — Encounter: Payer: Self-pay | Admitting: Adult Health

## 2020-10-20 ENCOUNTER — Non-Acute Institutional Stay (SKILLED_NURSING_FACILITY): Payer: Medicare HMO | Admitting: Adult Health

## 2020-10-20 ENCOUNTER — Other Ambulatory Visit (HOSPITAL_COMMUNITY)
Admission: RE | Admit: 2020-10-20 | Discharge: 2020-10-20 | Disposition: A | Discharge status: Home / Self Care | Payer: Medicare HMO | Source: Skilled Nursing Facility | Attending: Internal Medicine | Admitting: Internal Medicine

## 2020-10-20 DIAGNOSIS — K5909 Other constipation: Secondary | ICD-10-CM | POA: Diagnosis present

## 2020-10-20 LAB — CBC WITH DIFFERENTIAL/PLATELET
Abs Immature Granulocytes: 0.02 10*3/uL (ref 0.00–0.07)
Basophils Absolute: 0 10*3/uL (ref 0.0–0.1)
Basophils Relative: 0 %
Eosinophils Absolute: 0 10*3/uL (ref 0.0–0.5)
Eosinophils Relative: 0 %
HCT: 42.8 % (ref 36.0–46.0)
Hemoglobin: 13.1 g/dL (ref 12.0–15.0)
Immature Granulocytes: 0 %
Lymphocytes Relative: 10 %
Lymphs Abs: 0.9 10*3/uL (ref 0.7–4.0)
MCH: 24.5 pg — ABNORMAL LOW (ref 26.0–34.0)
MCHC: 30.6 g/dL (ref 30.0–36.0)
MCV: 80 fL (ref 80.0–100.0)
Monocytes Absolute: 0.4 10*3/uL (ref 0.1–1.0)
Monocytes Relative: 5 %
Neutro Abs: 7 10*3/uL (ref 1.7–7.7)
Neutrophils Relative %: 85 %
Platelets: 458 10*3/uL — ABNORMAL HIGH (ref 150–400)
RBC: 5.35 MIL/uL — ABNORMAL HIGH (ref 3.87–5.11)
RDW: 15.2 % (ref 11.5–15.5)
WBC: 8.2 10*3/uL (ref 4.0–10.5)
nRBC: 0 % (ref 0.0–0.2)

## 2020-10-20 LAB — COMPREHENSIVE METABOLIC PANEL
ALT: 16 U/L (ref 0–44)
AST: 34 U/L (ref 15–41)
Albumin: 3.9 g/dL (ref 3.5–5.0)
Alkaline Phosphatase: 86 U/L (ref 38–126)
Anion gap: 14 (ref 5–15)
BUN: 17 mg/dL (ref 8–23)
CO2: 23 mmol/L (ref 22–32)
Calcium: 9.1 mg/dL (ref 8.9–10.3)
Chloride: 94 mmol/L — ABNORMAL LOW (ref 98–111)
Creatinine, Ser: 0.53 mg/dL (ref 0.44–1.00)
GFR, Estimated: >60 mL/min (ref >60)
Glucose, Bld: 143 mg/dL — ABNORMAL HIGH (ref 70–99)
Potassium: 4.4 mmol/L (ref 3.5–5.1)
Sodium: 131 mmol/L — ABNORMAL LOW (ref 135–145)
Total Bilirubin: 0.3 mg/dL (ref 0.3–1.2)
Total Protein: 8.8 g/dL — ABNORMAL HIGH (ref 6.5–8.1)

## 2020-10-20 NOTE — Progress Notes (Signed)
Location:  [facility] Room Number: 105/D Place of Service:  SNF (31)   CODE STATUS: Full Code  No Known Allergies  Chief Complaint  Patient presents with  . Acute Visit    Abdominal Pain    HPI:  The nursing staff report that she is complaining of right sided abdominal pain. There are no reports of fevers; no reports of n/v/d. She does complain of constipation. The pain started today; she has not received any treatment.   Past Medical History:  Diagnosis Date  . Abnormal posture    Per [employer], [facility] Electronic Medical Records System   . Anemia   . Cognitive communication deficit    Per [employer], [facility] Records [facility]   . Contracture of left knee    Per [employer], [facility] Records [facility]   . Diabetes mellitus without complication (HCC)   . Difficulty walking    Per [employer], [facility] Records [facility]   . Extended spectrum beta lactamase (ESBL) resistance    Per [employer], [facility] Records [facility]   . Gastroesophageal reflux disease    Per [employer], [facility] Records [facility]   . Glaucoma   . Gram negative sepsis (HCC)    Per [employer], [facility] Records [facility]   . Hypercholesteremia   . Hypertension   . Hypokalemia   . Infection, bacterial    Per [employer], [facility] Records [facility]   . Muscle weakness (generalized)    Per [employer], [facility] Records [facility]   . Need for assistance with personal care    Per [employer], [facility] Records [facility]   . OAB (overactive bladder)    Per [employer], [facility] Records [facility]   . Ocular hypertension, bilateral    Per [employer], [facility] Records [facility]   . Osteoarthritis of right knee     Per [employer], [facility] Records [facility]   . Paraplegia (HCC)   . Pyelonephritis, acute    Per [employer], [facility] Records [facility]   . Status post total right knee replacement   . Unspecified dementia without behavioral disturbance (HCC)    Per [employer], [facility] Records [facility]   . Urgency of urination    Per [employer], [facility] Records [facility]   . Wheelchair dependence    Per [employer], [facility] Electronic [facility] Records [facility]     Past Surgical History:  Procedure Laterality Date  . ABDOMINAL HYSTERECTOMY    . CATARACT EXTRACTION    . COLONOSCOPY N/A 12/10/2013   Procedure: COLONOSCOPY;  Surgeon: West Bali, MD;  Location: [facility];  Service: Endoscopy;  Laterality: N/A;  8:30 AM  . TOTAL KNEE ARTHROPLASTY Right 03/30/2016   Procedure: TOTAL KNEE ARTHROPLASTY;  Surgeon: Vickki Hearing, MD;  Location: [facility];  Service: Orthopedics;  Laterality: Right;    Social History   Socioeconomic History  . Marital status: Divorced    Spouse name: Not on file  . Number of children: Not on file  . Years of education: Not on file  . Highest education level: Not on file  Occupational History  . Occupation: retired   Tobacco Use  . Smoking status: Current Some Day Smoker    Packs/day: 0.25  Years: 3.00    Pack years: 0.75    Types: Cigarettes  . Smokeless tobacco: Never Used  Vaping Use  . Vaping Use: Never used  Substance and Sexual Activity  . Alcohol use: No  . Drug use: No  . Sexual activity: Not Currently    Birth control/protection: Post-menopausal  Other Topics Concern  . Not on file  Social History Narrative   Long term resident of [facility]    Social Determinants of Health   Financial Resource Strain: Not on file  Food Insecurity: Not on file  Transportation Needs: Not on file  Physical Activity: Inactive  . Days of Exercise per Week:  0 days  . Minutes of Exercise per Session: 0 min  Stress: Not on file  Social Connections: Not on file  Intimate Partner Violence: Not At Risk  . Fear of Current or Ex-Partner: No  . Emotionally Abused: No  . Physically Abused: No  . Sexually Abused: No   Family History  Problem Relation Age of Onset  . Cancer Mother   . Arthritis Mother   . Hypotension Father   . Cancer Sister   . Hypotension Sister   . Colon cancer Neg Hx       VITAL SIGNS BP 124/69   Pulse 95   Temp 97.9 F (36.6 C)   Resp 20   Ht 5\' 5"  (1.651 m)   Wt 208 lb 12.8 oz (94.7 kg)   BMI 34.75 kg/m   Outpatient Encounter Medications as of 10/20/2020  Medication Sig  . acetaminophen (TYLENOL) 325 MG tablet Take 650 mg by mouth 3 (three) times daily.  10/22/2020 ascorbic acid (VITAMIN C) 500 MG tablet Take 500 mg by mouth daily.  Marland Kitchen aspirin EC 81 MG tablet Take 81 mg by mouth daily.  . calcium citrate (CALCITRATE - DOSED IN MG ELEMENTAL CALCIUM) 950 MG tablet Take 200 mg of elemental calcium by mouth daily.  . diclofenac Sodium (VOLTAREN) 1 % GEL Apply 4 g topically 3 (three) times daily as needed. Special Instructions: apply to right knee  . diclofenac Sodium (VOLTAREN) 1 % GEL Apply 2 g topically 2 (two) times daily as needed. Special Instructions: apply to left index finger for arthritis pain  . iron polysaccharides (NIFEREX) 150 MG capsule Take 1 capsule by mouth daily.  Marland Kitchen latanoprost (XALATAN) 0.005 % ophthalmic solution Place 1 drop into both eyes at bedtime. wait 5 minutes between multiple eye drops  . NON FORMULARY Diet: NAS,  . omeprazole (PRILOSEC) 20 MG capsule Take 20 mg by mouth daily. Special Instructions: TAKE 1 CAPSULE BY MOUTH ONCE DAILY *TAKE ON AN EMPTY STOMACH* FOR GERD *DO NOT CRUSH* (FORMULARY SUB FOR PANTOPRAZOLE 40MG ) Once A Day  . oxybutynin (DITROPAN-XL) 10 MG 24 hr tablet Take 10 mg by mouth daily.  Marland Kitchen zinc oxide 20 % ointment Apply 1 application topically in the morning, at noon, and at  bedtime. Apply to sacrum and bilateral buttocks every shift   No facility-administered encounter medications on file as of 10/20/2020.     SIGNIFICANT DIAGNOSTIC EXAMS   PREVIOUS  09-03-19: ct of abdomen and pelvis:  1. Wall thickening of the distal esophagus, suspicious foresophagitis or reflux. 2. Slight heterogeneous nephrograms of both kidneys, may representpyelonephritis in the setting of recent urinary tract infection. No focal fluid collection. 3. No CT findings of acute cholecystitis. 4. Trabecular coarsening of the left inferior pubic ramus and ischium, may be sequela of prior trauma or Paget's disease. Aortic Atherosclerosis .  02-05-20: DEXA: t score: -2.279  TODAY  10-20-20: KUB:  There is a nonspecific bowel gas pattern without obstruction. Moderate to marked increase feces in the colon. No renal stone is seen.  LABS REVIEWED PREVIOUS;   09-24-19: wbc 5.0; hgb 11.4; hct 37.2; mcv 88.2 plt 441; glucose 137; bun 19; creat 0.59; k+ 4.3; na++ 136; ca 8.7 02-19-20: glucose 89; bun 23; creat 0.54; k+ 4.1 na++ 137; ca 8.9  03-11-20: k+ 3.9 04-08-20: wbc 6.4; hgb 9.6; hct 32.0; mcv 82.3 plt 372; glucose 91; bun 19; creat 0.56; k+ 3.6; na++ 138; ca 8.7 liver normal albumin 2.9  05-18-20: hgb 10.0; hct 32.9  05-30-20: uric acid: 4.8  TODAY  10-20-20: wbc 8.2; hgb 13.1; hct 42.8; mcv 80.0 plt 458; glucose 143; bun 17; creat 0.53; k+ 4.4; na++ 131; ca 9.1 GFR>60  liver normal albumin 3.9    Review of Systems  Constitutional: Negative for malaise/fatigue.  Respiratory: Negative for cough and shortness of breath.   Cardiovascular: Negative for chest pain, palpitations and leg swelling.  Gastrointestinal: Positive for abdominal pain and constipation. Negative for heartburn, nausea and vomiting.  Musculoskeletal: Negative for back pain, joint pain and myalgias.  Skin: Negative.   Neurological: Negative for dizziness.  Psychiatric/Behavioral: The patient is not nervous/anxious.      Physical Exam Constitutional:      General: She is not in acute distress.    Appearance: She is well-developed and overweight. She is not diaphoretic.  Neck:     Thyroid: No thyromegaly.  Cardiovascular:     Rate and Rhythm: Normal rate and regular rhythm.     Pulses: Normal pulses.     Heart sounds: Normal heart sounds.  Pulmonary:     Effort: Pulmonary effort is normal. No respiratory distress.     Breath sounds: Normal breath sounds.  Abdominal:     General: There is no distension.     Palpations: Abdomen is soft.     Tenderness: There is abdominal tenderness.     Comments: Right side tenderness: bowel sounds all four quads  Musculoskeletal:     Cervical back: Neck supple.     Right lower leg: No edema.     Left lower leg: No edema.     Comments:  Is able to move all extremities  History of right knee replacement with contracture    Lymphadenopathy:     Cervical: No cervical adenopathy.  Skin:    General: Skin is warm and dry.  Neurological:     Mental Status: She is alert. Mental status is at baseline.  Psychiatric:        Mood and Affect: Mood normal.       ASSESSMENT/ PLAN:  TODAY  1. Chronic constipation is worse: will give her MOM 60 mL and will monitor her status.    Synthia Innocent NP [facility]  Contact [phone] Monday through Friday 8am- 5pm  After hours call [phone]

## 2020-10-22 DIAGNOSIS — K5909 Other constipation: Secondary | ICD-10-CM | POA: Insufficient documentation

## 2020-11-16 ENCOUNTER — Encounter: Payer: Self-pay | Admitting: Adult Health

## 2020-11-16 ENCOUNTER — Non-Acute Institutional Stay (SKILLED_NURSING_FACILITY): Payer: Medicare HMO | Admitting: Adult Health

## 2020-11-16 DIAGNOSIS — H40053 Ocular hypertension, bilateral: Secondary | ICD-10-CM

## 2020-11-16 DIAGNOSIS — I700 Atherosclerosis of aorta: Secondary | ICD-10-CM

## 2020-11-16 DIAGNOSIS — E876 Hypokalemia: Secondary | ICD-10-CM | POA: Diagnosis not present

## 2020-11-16 DIAGNOSIS — F0390 Unspecified dementia without behavioral disturbance: Secondary | ICD-10-CM

## 2020-11-16 NOTE — Progress Notes (Signed)
Location:  [facility] Room Number: 105/D Place of Service:  SNF (31)   CODE STATUS: Full Code  No Known Allergies  Chief Complaint  Patient presents with  . Medical Management of Chronic Issues            Vascular dementia without behavioral disturbance:   Aortic atherosclerosis  Hypokalemia:    Increased intraocular pressure bilateral     HPI:  She is a 86 year old long term resident of this facility being seen for the management of her chronic illnesses; Vascular dementia without behavioral disturbance:   Aortic atherosclerosis  Hypokalemia:    Increased intraocular pressure bilateral. She continues to get out of her room on a daily basis. There are no reports of uncontrolled pain; no reports of changes in appetite; no insomnia.   Past Medical History:  Diagnosis Date  . Abnormal posture    Per [employer], [facility] Electronic Medical Records [facility]   . Anemia   . Cognitive communication deficit    Per [employer], [facility] Records [facility]   . Contracture of left knee    Per [employer], [facility] Records [facility]   . Diabetes mellitus without complication (HCC)   . Difficulty walking    Per [employer], [facility] Records [facility]   . Extended spectrum beta lactamase (ESBL) resistance    Per [employer], [facility] Records [facility]   . Gastroesophageal reflux disease    Per [employer], [facility] Records [facility]   . Glaucoma   . Gram negative sepsis (HCC)    Per [employer], [facility] Records [facility]   . Hypercholesteremia   . Hypertension   . Hypokalemia   . Infection, bacterial    Per [employer], [facility] Records [facility]   . Muscle weakness (generalized)    Per [employer], [facility] Records [facility]   . Need for assistance with personal  care    Per [employer], [facility] Records [facility]   . OAB (overactive bladder)    Per [employer], [facility] Records [facility]   . Ocular hypertension, bilateral    Per [employer], [facility] Records [facility]   . Osteoarthritis of right knee    Per [employer], [facility] Records [facility]   . Paraplegia (HCC)   . Pyelonephritis, acute    Per [employer], [facility] Records [facility]   . Status post total right knee replacement   . Unspecified dementia without behavioral disturbance (HCC)    Per [employer], [facility] Records [facility]   . Urgency of urination    Per [employer], [facility] Records [facility]   . Wheelchair dependence    Per [employer], [facility] Electronic [facility] Records [facility]     Past Surgical History:  Procedure Laterality Date  . ABDOMINAL HYSTERECTOMY    . CATARACT EXTRACTION    . COLONOSCOPY N/A 12/10/2013   Procedure: COLONOSCOPY;  Surgeon: West Bali, MD;  Location: [facility];  Service: Endoscopy;  Laterality: N/A;  8:30 AM  . TOTAL KNEE ARTHROPLASTY Right 03/30/2016   Procedure: TOTAL KNEE ARTHROPLASTY;  Surgeon: Vickki Hearing, MD;  Location: [facility];  Service: Orthopedics;  Laterality: Right;    Social History   Socioeconomic History  . Marital status: Divorced  Spouse name: Not on file  . Number of children: Not on file  . Years of education: Not on file  . Highest education level: Not on file  Occupational History  . Occupation: retired   Tobacco Use  . Smoking status: Current Some Day Smoker    Packs/day: 0.25    Years: 3.00    Pack years: 0.75    Types: Cigarettes  . Smokeless tobacco: Never Used  Vaping Use  . Vaping Use: Never used  Substance and Sexual Activity  . Alcohol use: No  . Drug use: No  . Sexual activity: Not Currently    Birth  control/protection: Post-menopausal  Other Topics Concern  . Not on file  Social History Narrative   Long term resident of [facility]    Social Determinants of Health   Financial Resource Strain: Not on file  Food Insecurity: Not on file  Transportation Needs: Not on file  Physical Activity: Inactive  . Days of Exercise per Week: 0 days  . Minutes of Exercise per Session: 0 min  Stress: Not on file  Social Connections: Not on file  Intimate Partner Violence: Not At Risk  . Fear of Current or Ex-Partner: No  . Emotionally Abused: No  . Physically Abused: No  . Sexually Abused: No   Family History  Problem Relation Age of Onset  . Cancer Mother   . Arthritis Mother   . Hypotension Father   . Cancer Sister   . Hypotension Sister   . Colon cancer Neg Hx       VITAL SIGNS BP (!) 146/74   Pulse 86   Temp 97.6 F (36.4 C)   Ht 5\' 5"  (1.651 m)   Wt 212 lb 6.4 oz (96.3 kg)   BMI 35.35 kg/m   Outpatient Encounter Medications as of 11/16/2020  Medication Sig  . acetaminophen (TYLENOL) 325 MG tablet Take 650 mg by mouth 3 (three) times daily.  11/18/2020 ascorbic acid (VITAMIN C) 500 MG tablet Take 500 mg by mouth daily.  Marland Kitchen aspirin EC 81 MG tablet Take 81 mg by mouth daily.  . calcium citrate (CALCITRATE - DOSED IN MG ELEMENTAL CALCIUM) 950 MG tablet Take 200 mg of elemental calcium by mouth daily.  . diclofenac Sodium (VOLTAREN) 1 % GEL Apply 4 g topically 3 (three) times daily as needed. Special Instructions: apply to right knee  . diclofenac Sodium (VOLTAREN) 1 % GEL Apply 2 g topically 2 (two) times daily as needed. Special Instructions: apply to left index finger for arthritis pain  . iron polysaccharides (NIFEREX) 150 MG capsule Take 1 capsule by mouth daily.  Marland Kitchen latanoprost (XALATAN) 0.005 % ophthalmic solution Place 1 drop into both eyes at bedtime. wait 5 minutes between multiple eye drops  . NON FORMULARY Diet: NAS,  . omeprazole (PRILOSEC) 20 MG capsule Take 20 mg by mouth daily.  Special Instructions: TAKE 1 CAPSULE BY MOUTH ONCE DAILY *TAKE ON AN EMPTY STOMACH* FOR GERD *DO NOT CRUSH* (FORMULARY SUB FOR PANTOPRAZOLE 40MG ) Once A Day  . oxybutynin (DITROPAN-XL) 10 MG 24 hr tablet Take 10 mg by mouth daily.  Marland Kitchen zinc oxide 20 % ointment Apply 1 application topically in the morning, at noon, and at bedtime. Apply to sacrum and bilateral buttocks every shift   No facility-administered encounter medications on file as of 11/16/2020.     SIGNIFICANT DIAGNOSTIC EXAMS   PREVIOUS  09-03-19: ct of abdomen and pelvis:  1. Wall thickening of the distal esophagus, suspicious foresophagitis  or reflux. 2. Slight heterogeneous nephrograms of both kidneys, may representpyelonephritis in the setting of recent urinary tract infection. No focal fluid collection. 3. No CT findings of acute cholecystitis. 4. Trabecular coarsening of the left inferior pubic ramus and ischium, may be sequela of prior trauma or Paget's disease. Aortic Atherosclerosis .  02-05-20: DEXA: t score: -2.279  10-20-20: KUB:  There is a nonspecific bowel gas pattern without obstruction. Moderate to marked increase feces in the colon. No renal stone is seen.  NO NEW EXAMS.   LABS REVIEWED PREVIOUS;   02-19-20: glucose 89; bun 23; creat 0.54; k+ 4.1 na++ 137; ca 8.9  03-11-20: k+ 3.9 04-08-20: wbc 6.4; hgb 9.6; hct 32.0; mcv 82.3 plt 372; glucose 91; bun 19; creat 0.56; k+ 3.6; na++ 138; ca 8.7 liver normal albumin 2.9  05-18-20: hgb 10.0; hct 32.9  05-30-20: uric acid: 4.8 10-20-20: wbc 8.2; hgb 13.1; hct 42.8; mcv 80.0 plt 458; glucose 143; bun 17; creat 0.53; k+ 4.4; na++ 131; ca 9.1 GFR>60  liver normal albumin 3.9    NO NEW LABS.   Review of Systems  Constitutional: Negative for malaise/fatigue.  Respiratory: Negative for cough and shortness of breath.   Cardiovascular: Negative for chest pain, palpitations and leg swelling.  Gastrointestinal: Negative for abdominal pain, constipation and heartburn.   Musculoskeletal: Negative for back pain, joint pain and myalgias.  Skin: Negative.   Neurological: Negative for dizziness.  Psychiatric/Behavioral: The patient is not nervous/anxious.    Physical Exam Constitutional:      General: She is not in acute distress.    Appearance: She is well-developed. She is obese. She is not diaphoretic.  Neck:     Thyroid: No thyromegaly.  Cardiovascular:     Rate and Rhythm: Normal rate and regular rhythm.     Pulses: Normal pulses.     Heart sounds: Normal heart sounds.  Pulmonary:     Effort: Pulmonary effort is normal. No respiratory distress.     Breath sounds: Normal breath sounds.  Abdominal:     General: Bowel sounds are normal. There is no distension.     Palpations: Abdomen is soft.     Tenderness: There is no abdominal tenderness.  Musculoskeletal:     Cervical back: Neck supple.     Right lower leg: No edema.     Left lower leg: No edema.     Comments: Is able to move all extremities  History of right knee replacement with contracture     Lymphadenopathy:     Cervical: No cervical adenopathy.  Skin:    General: Skin is warm and dry.  Neurological:     Mental Status: She is alert. Mental status is at baseline.  Psychiatric:        Mood and Affect: Mood normal.         ASSESSMENT/ PLAN:  TODAY;   1. Vascular dementia without behavioral disturbance: is without change: weight is stable at 212 pounds; will monitor   2. Aortic atherosclerosis (ct 09-03-19) will monitor   3. Hypokalemia: is stable at 4.4 will monitor   4. Increased intraocular pressure bilateral is stable will continue xalatan to both eyes.    PREVIOUS   5. Primary osteoarthritis right knee; left index finger: is stable will continue tylenol 650 mg three times daily voltaren gel 4 gm to right knee three times daily and left index finger 2 gm twice daily   6. Essential hypertension: is stable b/p 146/74 will continue asa 81  mg daily   7. Chronic anemia:  is stable hgb 10.0; will continue ferrex daily  8. GERD without esophagitis: is stable will continue protonix 40 mg daily   9. Urinary urgency: is stable will continue ditropan xl 10 mg daily       Synthia Innocent NP [facility]  Contact [phone] Monday through Friday 8am- 5pm  After hours call [phone]

## 2020-12-13 ENCOUNTER — Encounter: Payer: Self-pay | Admitting: Adult Health

## 2020-12-13 ENCOUNTER — Non-Acute Institutional Stay (SKILLED_NURSING_FACILITY): Payer: Medicare HMO | Admitting: Adult Health

## 2020-12-13 DIAGNOSIS — I10 Essential (primary) hypertension: Secondary | ICD-10-CM | POA: Diagnosis not present

## 2020-12-13 DIAGNOSIS — M1711 Unilateral primary osteoarthritis, right knee: Secondary | ICD-10-CM

## 2020-12-13 DIAGNOSIS — D649 Anemia, unspecified: Secondary | ICD-10-CM | POA: Diagnosis not present

## 2020-12-13 DIAGNOSIS — M19042 Primary osteoarthritis, left hand: Secondary | ICD-10-CM

## 2020-12-13 NOTE — Progress Notes (Signed)
Location:  [facility] Room Number: 105-D Place of Service:  SNF (31)   CODE STATUS: Full  No Known Allergies  Chief Complaint  Patient presents with  . Medical Management of Chronic Issues           Primary osteoarthritis right knee; left index finger:      Essential hypertension:   Chronic anemia    HPI:  She is a 86 year old long term resident of this facility being seen for the management of her chronic illnesses;  Primary osteoarthritis right knee; left index finger:      Essential hypertension:   Chronic anemia. There are no reports of uncontrolled pain; no reports of changes in appetite. She continues to socialize with others.   Past Medical History:  Diagnosis Date  . Abnormal posture    Per [employer], [facility] Records [facility]   . Anemia   . Cognitive communication deficit    Per [employer], [facility]   . Contracture of left knee    Per [employer], [facility]   . Diabetes mellitus without complication (HCC)   . Difficulty walking    Per [employer], [facility]   . Extended spectrum beta lactamase (ESBL) resistance    Per [employer], [facility]   . Gastroesophageal reflux disease    Per [employer], [facility]   . Glaucoma   . Gram negative sepsis (HCC)    Per [employer], [facility]   . Hypercholesteremia   . Hypertension   . Hypokalemia   . Infection, bacterial    Per [employer], [facility]   . Muscle weakness (generalized)    Per [employer], [facility]   . Need for assistance with personal care    Per [employer], [facility]   . OAB (overactive  bladder)    Per [employer], [facility]   . Ocular hypertension, bilateral    Per [employer], [facility]   . Osteoarthritis of right knee    Per [employer], [facility]   . Paraplegia (HCC)   . Pyelonephritis, acute    Per [employer], [facility]   . Status post total right knee replacement   . Unspecified dementia without behavioral disturbance (HCC)    Per [employer], [facility]   . Urgency of urination    Per [employer], [facility]   . Wheelchair dependence    Per [employer], [facility]     Past Surgical History:  Procedure Laterality Date  . ABDOMINAL HYSTERECTOMY    . CATARACT EXTRACTION    . COLONOSCOPY N/A 12/10/2013   Procedure: COLONOSCOPY;  Surgeon: West Bali, MD;  Location: [facility];  Service: Endoscopy;  Laterality: N/A;  8:30 AM  . TOTAL KNEE ARTHROPLASTY Right 03/30/2016   Procedure: TOTAL KNEE ARTHROPLASTY;  Surgeon: Vickki Hearing, MD;  Location: [facility];  Service: Orthopedics;  Laterality: Right;    Social History   Socioeconomic History  . Marital status: Divorced    Spouse name: Not on file  . Number  of children: Not on file  . Years of education: Not on file  . Highest education level: Not on file  Occupational History  . Occupation: retired   Tobacco Use  . Smoking status: Current Some Day Smoker    Packs/day: 0.25    Years: 3.00    Pack years: 0.75    Types: Cigarettes  . Smokeless tobacco: Never Used  Vaping Use  . Vaping Use: Never used  Substance and Sexual Activity  . Alcohol use: No  . Drug use: No  . Sexual activity: Not Currently    Birth control/protection: Post-menopausal  Other Topics Concern  . Not on file  Social History Narrative   [facility]    Social Determinants of Health   Financial Resource Strain: Not on file  Food Insecurity: Not on file  Transportation Needs: Not on file  Physical Activity: Inactive  . Days of Exercise per Week: 0 days  . Minutes of Exercise per Session: 0 min  Stress: Not on file  Social Connections: Not on file  Intimate Partner Violence: Not At Risk  . Fear of Current or Ex-Partner: No  . Emotionally Abused: No  . Physically Abused: No  . Sexually Abused: No   Family History  Problem Relation Age of Onset  . Cancer Mother   . Arthritis Mother   . Hypotension Father   . Cancer Sister   . Hypotension Sister   . Colon cancer Neg Hx       VITAL SIGNS BP (!) 112/54   Pulse 72   Temp 98.1 F (36.7 C)   Resp (!) 21   Ht 5\' 5"  (1.651 m)   Wt 212 lb (96.2 kg)   SpO2 95%   BMI 35.28 kg/m   Outpatient Encounter Medications as of 12/13/2020  Medication Sig  . acetaminophen (TYLENOL) 325 MG tablet Take 650 mg by mouth 3 (three) times daily.  Marland Kitchen. ascorbic acid (VITAMIN C) 500 MG tablet Take 500 mg by mouth daily.  Marland Kitchen. aspirin EC 81 MG tablet Take 81 mg by mouth daily.  . calcium citrate (CALCITRATE - DOSED IN MG ELEMENTAL CALCIUM) 950 MG tablet Take 200 mg of elemental calcium by mouth daily.  . diclofenac Sodium (VOLTAREN) 1 % GEL Apply 4 g topically 3 (three) times daily as needed. Special Instructions: apply to right knee  . diclofenac Sodium (VOLTAREN) 1 % GEL Apply 2 g topically 2 (two) times daily as needed. Special Instructions: apply to left index finger for arthritis pain  . iron polysaccharides (NIFEREX) 150 MG capsule Take 1 capsule by mouth daily.  Marland Kitchen. latanoprost (XALATAN) 0.005 % ophthalmic solution Place 1 drop into both eyes at bedtime. wait 5 minutes between multiple eye drops  . NON FORMULARY Diet: NAS,  . omeprazole (PRILOSEC) 20 MG capsule Take 20 mg by mouth daily. Special Instructions: TAKE 1 CAPSULE BY MOUTH ONCE DAILY *TAKE ON AN EMPTY STOMACH* FOR  GERD *DO NOT CRUSH* (FORMULARY SUB FOR PANTOPRAZOLE 40MG ) Once A Day  . oxybutynin (DITROPAN-XL) 10 MG 24 hr tablet Take 10 mg by mouth daily.  . [DISCONTINUED] zinc oxide 20 % ointment Apply 1 application topically in the morning, at noon, and at bedtime. Apply to sacrum and bilateral buttocks every shift (Patient not taking: Reported on 12/13/2020)   No facility-administered encounter medications on file as of 12/13/2020.     SIGNIFICANT DIAGNOSTIC EXAMS   PREVIOUS  09-03-19: ct of abdomen and pelvis:  1. Wall thickening  of the distal esophagus, suspicious foresophagitis or reflux. 2. Slight heterogeneous nephrograms of both kidneys, may representpyelonephritis in the setting of recent urinary tract infection. No focal fluid collection. 3. No CT findings of acute cholecystitis. 4. Trabecular coarsening of the left inferior pubic ramus and ischium, may be sequela of prior trauma or Paget's disease. Aortic Atherosclerosis .  02-05-20: DEXA: t score: -2.279  10-20-20: KUB:  There is a nonspecific bowel gas pattern without obstruction. Moderate to marked increase feces in the colon. No renal stone is seen.  NO NEW EXAMS.   LABS REVIEWED PREVIOUS;   02-19-20: glucose 89; bun 23; creat 0.54; k+ 4.1 na++ 137; ca 8.9  03-11-20: k+ 3.9 04-08-20: wbc 6.4; hgb 9.6; hct 32.0; mcv 82.3 plt 372; glucose 91; bun 19; creat 0.56; k+ 3.6; na++ 138; ca 8.7 liver normal albumin 2.9  05-18-20: hgb 10.0; hct 32.9  05-30-20: uric acid: 4.8 10-20-20: wbc 8.2; hgb 13.1; hct 42.8; mcv 80.0 plt 458; glucose 143; bun 17; creat 0.53; k+ 4.4; na++ 131; ca 9.1 GFR>60  liver normal albumin 3.9    NO NEW LABS.   Review of Systems  Constitutional: Negative for malaise/fatigue.  Respiratory: Negative for cough and shortness of breath.   Cardiovascular: Negative for chest pain, palpitations and leg swelling.  Gastrointestinal: Negative for abdominal pain, constipation and heartburn.  Musculoskeletal: Negative for  back pain, joint pain and myalgias.  Skin: Negative.   Neurological: Negative for dizziness.  Psychiatric/Behavioral: The patient is not nervous/anxious.      Physical Exam Constitutional:      General: She is not in acute distress.    Appearance: She is well-developed. She is obese. She is not diaphoretic.  Neck:     Thyroid: No thyromegaly.  Cardiovascular:     Rate and Rhythm: Normal rate and regular rhythm.     Pulses: Normal pulses.     Heart sounds: Normal heart sounds.  Pulmonary:     Effort: Pulmonary effort is normal. No respiratory distress.     Breath sounds: Normal breath sounds.  Abdominal:     General: Bowel sounds are normal. There is no distension.     Palpations: Abdomen is soft.     Tenderness: There is no abdominal tenderness.  Musculoskeletal:     Cervical back: Neck supple.     Right lower leg: No edema.     Left lower leg: No edema.     Comments:  Is able to move all extremities  History of right knee replacement with contracture    Lymphadenopathy:     Cervical: No cervical adenopathy.  Skin:    General: Skin is warm and dry.  Neurological:     Mental Status: She is alert. Mental status is at baseline.  Psychiatric:        Mood and Affect: Mood normal.      ASSESSMENT/ PLAN:  TODAY;   1. Primary osteoarthritis right knee; left index finger: is stable will continue tylenol 650 mg three times daily voltaren gel 4 gm to right knee three times daily left index finger 2 gm twice daily   2. Essential hypertension: is stable b/p 112/54 will continue asa 81 mg daily  3. Chronic anemia: is stable hgb 10.0; will continue ferrex daily   PREVIOUS   4. GERD without esophagitis: is stable will continue protonix 40 mg daily   5. Urinary urgency: is stable will continue ditropan xl 10 mg daily     6. Vascular dementia without  behavioral disturbance: is without change: weight is stable at 212 pounds; will monitor   7 Aortic atherosclerosis (ct 09-03-19)  will monitor   8. Hypokalemia: is stable at 4.4 will monitor   9. Increased intraocular pressure bilateral is stable will continue xalatan to both eyes.      Synthia Innocent NP [facility]  Contact [phone] Monday through Friday 8am- 5pm  After hours call [phone]

## 2020-12-16 ENCOUNTER — Non-Acute Institutional Stay (SKILLED_NURSING_FACILITY): Payer: Medicare HMO | Admitting: Adult Health

## 2020-12-16 ENCOUNTER — Encounter: Payer: Self-pay | Admitting: Adult Health

## 2020-12-16 DIAGNOSIS — D649 Anemia, unspecified: Secondary | ICD-10-CM

## 2020-12-16 DIAGNOSIS — I700 Atherosclerosis of aorta: Secondary | ICD-10-CM | POA: Diagnosis not present

## 2020-12-16 DIAGNOSIS — F0390 Unspecified dementia without behavioral disturbance: Secondary | ICD-10-CM | POA: Diagnosis not present

## 2020-12-16 DIAGNOSIS — I10 Essential (primary) hypertension: Secondary | ICD-10-CM

## 2020-12-16 NOTE — Progress Notes (Signed)
Location:  [facility] Room Number: 105-D Place of Service:  SNF (31)   CODE STATUS: Full  No Known Allergies  Chief Complaint  Patient presents with  . Acute Visit    Care plan meeting.    HPI:  We have come together for her care plan meeting.   10/15 BIMS: mood: 3/30: restless due to anxiety at times. She requires extensive to limited assist with her adls. She is able to feed self. She is incontinent of bladder and bowel. There have been no falls. Pain is managed with routine tylenol  Therapy none at this time.   Dietary: good appetite; up 8 pounds 3-6 months NAS diet; weight is 212 pounds. Does attend activities     She continues to be followed for her chronic illnesses including: Chronic anemia    Benign essential hypertension   Dementia without behavioral disturbance unspecified dementia type   Aortic atherosclerosis  Past Medical History:  Diagnosis Date  . Abnormal posture    Per [employer], [facility]   . Anemia   . Cognitive communication deficit    Per [employer], [facility]   . Contracture of left knee    Per [employer], [facility]   . Diabetes mellitus without complication (HCC)   . Difficulty walking    Per [employer], [facility]   . Extended spectrum beta lactamase (ESBL) resistance    Per [employer], [facility]   . Gastroesophageal reflux disease    Per [employer], [facility]   . Glaucoma   . Gram negative sepsis (HCC)    Per [employer], [facility]   . Hypercholesteremia   . Hypertension   . Hypokalemia   . Infection, bacterial    Per [employer], [facility]   . Muscle weakness (generalized)    Per [facility]   . Need for assistance with personal care    Per [employer], [facility]   . OAB (overactive bladder)    Per [employer], [facility]   . Ocular hypertension, bilateral    Per [employer], [facility]   . Osteoarthritis of right knee    Per [employer], [facility]   . Paraplegia (HCC)   . Pyelonephritis, acute    Per [employer], [facility]   . Status post total right knee replacement   . Unspecified dementia without behavioral disturbance (HCC)    Per [employer], [facility]   . Urgency of urination    Per [employer], [facility]   . Wheelchair dependence    Per [employer], [facility]     Past Surgical History:  Procedure Laterality Date  . ABDOMINAL HYSTERECTOMY    . CATARACT EXTRACTION    . COLONOSCOPY N/A 12/10/2013   Procedure: COLONOSCOPY;  Surgeon: West Bali, MD;  Location: [facility];  Service: Endoscopy;  Laterality: N/A;  8:30 AM  . TOTAL KNEE ARTHROPLASTY Right 03/30/2016   Procedure: TOTAL KNEE ARTHROPLASTY;  Surgeon: Vickki Hearing, MD;  Location: [facility];  Service:  Orthopedics;  Laterality: Right;    Social History   Socioeconomic History  . Marital status: Divorced    Spouse name: Not on file  . Number of children: Not on file  . Years of education: Not on file  . Highest education level: Not on file  Occupational History  . Occupation: retired   Tobacco Use  . Smoking status: Current Some Day Smoker    Packs/day: 0.25    Years: 3.00    Pack years: 0.75    Types: Cigarettes  . Smokeless tobacco: Never Used  Vaping Use  . Vaping Use: Never used  Substance and Sexual Activity  .  Alcohol use: No  . Drug use: No  . Sexual activity: Not Currently    Birth control/protection: Post-menopausal  Other Topics Concern  . Not on file  Social History Narrative   Long term resident of [facility]    Social Determinants of Health   Financial Resource Strain: Not on file  Food Insecurity: Not on file  Transportation Needs: Not on file  Physical Activity: Inactive  . Days of Exercise per Week: 0 days  . Minutes of Exercise per Session: 0 min  Stress: Not on file  Social Connections: Not on file  Intimate Partner Violence: Not At Risk  . Fear of Current or Ex-Partner: No  . Emotionally Abused: No  . Physically Abused: No  . Sexually Abused: No   Family History  Problem Relation Age of Onset  . Cancer Mother   . Arthritis Mother   . Hypotension Father   . Cancer Sister   . Hypotension Sister   . Colon cancer Neg Hx       VITAL SIGNS BP (!) 112/54   Pulse 72   Temp 97.7 F (36.5 C)   Resp (!) 21   Ht 5\' 5"  (1.651 m)   Wt 212 lb (96.2 kg)   SpO2 95%   BMI 35.28 kg/m   Outpatient Encounter Medications as of 12/16/2020  Medication Sig  . acetaminophen (TYLENOL) 325 MG tablet Take 650 mg by mouth 3 (three) times daily.  12/18/2020 ascorbic acid (VITAMIN C) 500 MG tablet Take 500 mg by mouth daily.  Marland Kitchen aspirin EC 81 MG tablet Take 81 mg by mouth daily.  . calcium citrate (CALCITRATE - DOSED IN MG ELEMENTAL CALCIUM) 950 MG tablet Take 200 mg of elemental calcium by mouth daily.  . diclofenac Sodium (VOLTAREN) 1 % GEL Apply 4 g topically 3 (three) times daily as needed. Special Instructions: apply to right knee  . diclofenac Sodium (VOLTAREN) 1 % GEL Apply 2 g topically 2 (two) times daily as needed. Special Instructions: apply to left index finger for arthritis pain  . iron polysaccharides (NIFEREX) 150 MG capsule Take 1 capsule by mouth daily.  Marland Kitchen latanoprost (XALATAN) 0.005 % ophthalmic solution Place 1 drop into both eyes at bedtime. wait 5 minutes between multiple eye  drops  . NON FORMULARY Diet: NAS,  . omeprazole (PRILOSEC) 20 MG capsule Take 20 mg by mouth daily. Special Instructions: TAKE 1 CAPSULE BY MOUTH ONCE DAILY *TAKE ON AN EMPTY STOMACH* FOR GERD *DO NOT CRUSH* (FORMULARY SUB FOR PANTOPRAZOLE 40MG ) Once A Day  . oxybutynin (DITROPAN-XL) 10 MG 24 hr tablet Take 10 mg by mouth daily.   No facility-administered encounter medications on file as of 12/16/2020.     SIGNIFICANT DIAGNOSTIC EXAMS   PREVIOUS  09-03-19: ct of abdomen and pelvis:  1. Wall thickening of the distal esophagus, suspicious  foresophagitis or reflux. 2. Slight heterogeneous nephrograms of both kidneys, may representpyelonephritis in the setting of recent urinary tract infection. No focal fluid collection. 3. No CT findings of acute cholecystitis. 4. Trabecular coarsening of the left inferior pubic ramus and ischium, may be sequela of prior trauma or Paget's disease. Aortic Atherosclerosis .  02-05-20: DEXA: t score: -2.279  10-20-20: KUB:  There is a nonspecific bowel gas pattern without obstruction. Moderate to marked increase feces in the colon. No renal stone is seen.  NO NEW EXAMS.   LABS REVIEWED PREVIOUS;   02-19-20: glucose 89; bun 23; creat 0.54; k+ 4.1 na++ 137; ca 8.9  03-11-20: k+ 3.9 04-08-20: wbc 6.4; hgb 9.6; hct 32.0; mcv 82.3 plt 372; glucose 91; bun 19; creat 0.56; k+ 3.6; na++ 138; ca 8.7 liver normal albumin 2.9  05-18-20: hgb 10.0; hct 32.9  05-30-20: uric acid: 4.8 10-20-20: wbc 8.2; hgb 13.1; hct 42.8; mcv 80.0 plt 458; glucose 143; bun 17; creat 0.53; k+ 4.4; na++ 131; ca 9.1 GFR>60  liver normal albumin 3.9    NO NEW LABS.   Review of Systems  Constitutional: Negative for malaise/fatigue.  Respiratory: Negative for cough and shortness of breath.   Cardiovascular: Negative for chest pain, palpitations and leg swelling.  Gastrointestinal: Negative for abdominal pain, constipation and heartburn.  Musculoskeletal: Negative for back pain, joint pain  and myalgias.  Skin: Negative.   Neurological: Negative for dizziness.  Psychiatric/Behavioral: The patient is not nervous/anxious.      Physical Exam Constitutional:      General: She is not in acute distress.    Appearance: She is well-developed. She is not diaphoretic.  Neck:     Thyroid: No thyromegaly.  Cardiovascular:     Rate and Rhythm: Normal rate and regular rhythm.     Pulses: Normal pulses.     Heart sounds: Normal heart sounds.  Pulmonary:     Effort: Pulmonary effort is normal. No respiratory distress.     Breath sounds: Normal breath sounds.  Abdominal:     General: Bowel sounds are normal. There is no distension.     Palpations: Abdomen is soft.     Tenderness: There is no abdominal tenderness.  Musculoskeletal:     Cervical back: Neck supple.     Right lower leg: No edema.     Left lower leg: No edema.     Comments: Is able to move all extremities  History of right knee replacement with contracture     Lymphadenopathy:     Cervical: No cervical adenopathy.  Skin:    General: Skin is warm and dry.  Neurological:     Mental Status: She is alert. Mental status is at baseline.  Psychiatric:        Mood and Affect: Mood normal.       ASSESSMENT/ PLAN:  TODAY  1. Chronic anemia 2. Benign essential hypertension 3. Dementia without behavioral disturbance unspecified dementia type 4. Aortic atherosclerosis  Will continue current medications Will continue current plan of care Will continue to monitor her status.    Time spent with patient: 40 minutes: medications; plan of care; dietary needs.    Synthia Innocent NP [facility]  Contact [phone] Monday through Friday 8am- 5pm  After hours call [phone]

## 2021-01-17 ENCOUNTER — Encounter (HOSPITAL_COMMUNITY)
Admission: AD | Admit: 2021-01-17 | Discharge: 2021-01-17 | Disposition: A | Discharge status: Home / Self Care | Payer: Medicare HMO | Source: Skilled Nursing Facility | Attending: Adult Health | Admitting: Adult Health

## 2021-01-17 DIAGNOSIS — U071 COVID-19: Secondary | ICD-10-CM | POA: Insufficient documentation

## 2021-01-17 LAB — CBC WITH DIFFERENTIAL/PLATELET
Abs Immature Granulocytes: 0.01 10*3/uL (ref 0.00–0.07)
Basophils Absolute: 0.1 10*3/uL (ref 0.0–0.1)
Basophils Relative: 1 %
Eosinophils Absolute: 0.4 10*3/uL (ref 0.0–0.5)
Eosinophils Relative: 7 %
HCT: 33.6 % — ABNORMAL LOW (ref 36.0–46.0)
Hemoglobin: 10.3 g/dL — ABNORMAL LOW (ref 12.0–15.0)
Immature Granulocytes: 0 %
Lymphocytes Relative: 32 %
Lymphs Abs: 1.6 10*3/uL (ref 0.7–4.0)
MCH: 24.9 pg — ABNORMAL LOW (ref 26.0–34.0)
MCHC: 30.7 g/dL (ref 30.0–36.0)
MCV: 81.4 fL (ref 80.0–100.0)
Monocytes Absolute: 0.8 10*3/uL (ref 0.1–1.0)
Monocytes Relative: 17 %
Neutro Abs: 2.1 10*3/uL (ref 1.7–7.7)
Neutrophils Relative %: 43 %
Platelets: 385 10*3/uL (ref 150–400)
RBC: 4.13 MIL/uL (ref 3.87–5.11)
RDW: 15.6 % — ABNORMAL HIGH (ref 11.5–15.5)
WBC: 4.9 10*3/uL (ref 4.0–10.5)
nRBC: 0 % (ref 0.0–0.2)

## 2021-01-17 LAB — BASIC METABOLIC PANEL
Anion gap: 4 — ABNORMAL LOW (ref 5–15)
BUN: 11 mg/dL (ref 8–23)
CO2: 28 mmol/L (ref 22–32)
Calcium: 7.9 mg/dL — ABNORMAL LOW (ref 8.9–10.3)
Chloride: 102 mmol/L (ref 98–111)
Creatinine, Ser: 0.5 mg/dL (ref 0.44–1.00)
GFR, Estimated: >60 mL/min (ref >60)
Glucose, Bld: 105 mg/dL — ABNORMAL HIGH (ref 70–99)
Potassium: 3.5 mmol/L (ref 3.5–5.1)
Sodium: 134 mmol/L — ABNORMAL LOW (ref 135–145)

## 2021-01-17 LAB — RESP PANEL BY RT-PCR (FLU A&B, COVID) ARPGX2
Influenza A by PCR: NEGATIVE
Influenza B by PCR: NEGATIVE
SARS Coronavirus 2 by RT PCR: POSITIVE — AB

## 2021-01-17 LAB — C-REACTIVE PROTEIN: CRP: 5.3 mg/dL — ABNORMAL HIGH (ref <1.0)

## 2021-01-17 LAB — D-DIMER, QUANTITATIVE: D-Dimer, Quant: 5.93 ug/mL-FEU — ABNORMAL HIGH (ref 0.00–0.50)

## 2021-01-19 ENCOUNTER — Encounter: Payer: Self-pay | Admitting: Adult Health

## 2021-01-19 ENCOUNTER — Non-Acute Institutional Stay (SKILLED_NURSING_FACILITY): Payer: Medicare HMO | Admitting: Adult Health

## 2021-01-19 DIAGNOSIS — F0390 Unspecified dementia without behavioral disturbance: Secondary | ICD-10-CM

## 2021-01-19 DIAGNOSIS — N3281 Overactive bladder: Secondary | ICD-10-CM

## 2021-01-19 DIAGNOSIS — K219 Gastro-esophageal reflux disease without esophagitis: Secondary | ICD-10-CM | POA: Diagnosis not present

## 2021-01-19 DIAGNOSIS — U071 COVID-19: Secondary | ICD-10-CM

## 2021-01-19 NOTE — Progress Notes (Signed)
Location:  [facility] Room Number: 105 Place of Service:  SNF (31)   CODE STATUS full code   No Known Allergies  Chief Complaint  Patient presents with   Medical Management of Chronic Issues          GERD without esophagitis:  Urinary urgency:  Vascular dementia without behavioral disturbance:    HPI:  She is a 87 year old long term resident of this facility being seen for the management of her chronic illnesses: GERD without esophagitis:  Urinary urgency:  Vascular dementia without behavioral disturbance. There are no reports of uncontrolled pain.   She has covid 19. Her d-dimer is elevated and is presently on low dose eliquis. She is on vitamin supplements. She denies any cough or shortness of breath.   Past Medical History:  Diagnosis Date   Abnormal posture    Per [employer], [facility] Electronic [facility] Records System    Anemia    Cognitive communication deficit    Per [employer], [facility] Records [facility]    Contracture of left knee    Per [employer], [facility] Records [facility]    Diabetes mellitus without complication (HCC)    Difficulty walking    Per [employer], [facility] Records [facility]    Extended spectrum beta lactamase (ESBL) resistance    Per [employer], [facility] Records [facility]    Gastroesophageal reflux disease    Per [employer], [facility] Records [facility]    Glaucoma    Gram negative sepsis (HCC)    Per [employer], [facility] Electronic [facility] Records [facility]    Hypercholesteremia    Hypertension    Hypokalemia    Infection, bacterial    Per [employer], [facility] Electronic Medical Records [facility]    Muscle weakness (generalized)    Per [employer], [facility] Records [facility]    Need for assistance with personal care    Per [employer], [facility]  Electronic Medical Records System    OAB (overactive bladder)    Per [employer], [facility] Records [facility]    Ocular hypertension, bilateral    Per [employer], [facility] Records [facility]    Osteoarthritis of right knee    Per [employer], [facility] Records [facility]    Paraplegia [facility]    Pyelonephritis, acute    Per [employer], [facility] Electronic Medical Records [facility]    Status post total right knee replacement    Unspecified dementia without behavioral disturbance (HCC)    Per [employer], [facility] Electronic [facility] Records [facility]    Urgency of urination    Per [employer], [facility] Records [facility]    Wheelchair dependence    Per [employer], [facility] Electronic Medical Records System     Past Surgical History:  Procedure Laterality Date   ABDOMINAL HYSTERECTOMY     CATARACT EXTRACTION     COLONOSCOPY N/A 12/10/2013   Procedure: COLONOSCOPY;  Surgeon: West Bali, MD;  Location: [facility];  Service: Endoscopy;  Laterality: N/A;  8:30 AM   TOTAL KNEE ARTHROPLASTY Right 03/30/2016   Procedure: TOTAL KNEE ARTHROPLASTY;  Surgeon: Vickki Hearing, MD;  Location: [facility];  Service: Orthopedics;  Laterality: Right;    Social History   Socioeconomic History   Marital status: Divorced  Spouse name: Not on file   Number of children: Not on file   Years of education: Not on file   Highest education level: Not on file  Occupational History   Occupation: retired   Tobacco Use   Smoking status: Some Days    Packs/day: 0.25    Years: 3.00    Pack years: 0.75    Types: Cigarettes   Smokeless tobacco: Never  Vaping Use   Vaping Use: Never used  Substance and Sexual Activity   Alcohol use: No   Drug use: No   Sexual activity: Not Currently    Birth control/protection: Post-menopausal  Other Topics Concern   Not on file  Social History Narrative    Long term resident of [facility]    Social Determinants of Health   Financial Resource Strain: Not on file  Food Insecurity: Not on file  Transportation Needs: Not on file  Physical Activity: Inactive   Days of Exercise per Week: 0 days   Minutes of Exercise per Session: 0 min  Stress: Not on file  Social Connections: Not on file  Intimate Partner Violence: Not At Risk   Fear of Current or Ex-Partner: No   Emotionally Abused: No   Physically Abused: No   Sexually Abused: No   Family History  Problem Relation Age of Onset   Cancer Mother    Arthritis Mother    Hypotension Father    Cancer Sister    Hypotension Sister    Colon cancer Neg Hx       VITAL SIGNS BP (!) 142/78   Pulse 88   Temp 98.4 F (36.9 C)   Resp 18   Ht 5\' 5"  (1.651 m)   Wt 210 lb 6.4 oz (95.4 kg)   SpO2 94%   BMI 35.01 kg/m   Outpatient Encounter Medications as of 01/19/2021  Medication Sig   acetaminophen (TYLENOL) 325 MG tablet Take 650 mg by mouth 3 (three) times daily.   ascorbic acid (VITAMIN C) 500 MG tablet Take 500 mg by mouth daily.   aspirin EC 81 MG tablet Take 81 mg by mouth daily.   calcium citrate (CALCITRATE - DOSED IN MG ELEMENTAL CALCIUM) 950 MG tablet Take 200 mg of elemental calcium by mouth daily.   diclofenac Sodium (VOLTAREN) 1 % GEL Apply 4 g topically 3 (three) times daily as needed. Special Instructions: apply to right knee   diclofenac Sodium (VOLTAREN) 1 % GEL Apply 2 g topically 2 (two) times daily as needed. Special Instructions: apply to left index finger for arthritis pain   iron polysaccharides (NIFEREX) 150 MG capsule Take 1 capsule by mouth daily.   latanoprost (XALATAN) 0.005 % ophthalmic solution Place 1 drop into both eyes at bedtime. wait 5 minutes between multiple eye drops   NON FORMULARY Diet: NAS,   omeprazole (PRILOSEC) 20 MG capsule Take 20 mg by mouth daily. Special Instructions: TAKE 1 CAPSULE BY MOUTH ONCE DAILY *TAKE ON AN EMPTY STOMACH* FOR GERD *DO NOT  CRUSH* (FORMULARY SUB FOR PANTOPRAZOLE 40MG ) Once A Day   oxybutynin (DITROPAN-XL) 10 MG 24 hr tablet Take 10 mg by mouth daily.   No facility-administered encounter medications on file as of 01/19/2021.     SIGNIFICANT DIAGNOSTIC EXAMS  PREVIOUS  09-03-19: ct of abdomen and pelvis:  1. Wall thickening of the distal esophagus, suspicious foresophagitis or reflux. 2. Slight heterogeneous nephrograms of both kidneys, may representpyelonephritis in the setting of recent urinary tract infection. No focal fluid collection.  3. No CT findings of acute cholecystitis. 4. Trabecular coarsening of the left inferior pubic ramus and ischium, may be sequela of prior trauma or Paget's disease. Aortic Atherosclerosis .  02-05-20: DEXA: t score: -2.279  10-20-20: KUB:  There is a nonspecific bowel gas pattern without obstruction. Moderate to marked increase feces in the colon. No renal stone is seen.  TODAY  01-10-21: kub: significant fecal material identified.   01-17-21: chest x-ray: no evidence of acute cardiopulmonary disease; communicable  disease or tuberculosis    LABS REVIEWED PREVIOUS;   02-19-20: glucose 89; bun 23; creat 0.54; k+ 4.1 na++ 137; ca 8.9  03-11-20: k+ 3.9 04-08-20: wbc 6.4; hgb 9.6; hct 32.0; mcv 82.3 plt 372; glucose 91; bun 19; creat 0.56; k+ 3.6; na++ 138; ca 8.7 liver normal albumin 2.9  05-18-20: hgb 10.0; hct 32.9  05-30-20: uric acid: 4.8 10-20-20: wbc 8.2; hgb 13.1; hct 42.8; mcv 80.0 plt 458; glucose 143; bun 17; creat 0.53; k+ 4.4; na++ 131; ca 9.1 GFR>60  liver normal albumin 3.9    TODAY  01-17-21: wbc 4.3; hgb 10.3; hct 33.6; mcv 81.4 plt 385; glucose 105; bun 11; creat 0.50; k+ 3.5; na++ 134; ca 7.9; GFR >60; d-dimer: 5.93 CRP 5.3   Review of Systems  Constitutional:  Negative for malaise/fatigue.  Respiratory:  Positive for cough. Negative for shortness of breath.   Cardiovascular:  Negative for chest pain, palpitations and leg swelling.  Gastrointestinal:   Negative for abdominal pain, constipation and heartburn.  Musculoskeletal:  Negative for back pain, joint pain and myalgias.  Skin: Negative.   Neurological:  Negative for dizziness.  Psychiatric/Behavioral:  The patient is not nervous/anxious.       Physical Exam Constitutional:      General: She is not in acute distress.    Appearance: She is well-developed. She is not diaphoretic.  Neck:     Thyroid: No thyromegaly.  Cardiovascular:     Rate and Rhythm: Normal rate and regular rhythm.     Heart sounds: Normal heart sounds.  Pulmonary:     Effort: Pulmonary effort is normal. No respiratory distress.     Breath sounds: Rhonchi present.  Abdominal:     General: Bowel sounds are normal. There is no distension.     Palpations: Abdomen is soft.     Tenderness: There is no abdominal tenderness.  Musculoskeletal:     Cervical back: Neck supple.     Right lower leg: No edema.     Left lower leg: No edema.     Comments: Is able to move all extremities  History of right knee replacement with contracture      Lymphadenopathy:     Cervical: No cervical adenopathy.  Skin:    General: Skin is warm and dry.  Neurological:     Mental Status: She is alert. Mental status is at baseline.  Psychiatric:        Mood and Affect: Mood normal.     ASSESSMENT/ PLAN:  TODAY;   GERD without esophagitis: is stable will continue protonix 40 mg daily   2. Urinary urgency: is stable will continue ditropan xl 10 mg daily   3. Vascular dementia without behavioral disturbance: is stable weight is 210 pounds; will monitor    PREVIOUS   4 Aortic atherosclerosis (ct 09-03-19) will monitor   5. Hypokalemia: is stable at 4.4 will monitor   6. Increased intraocular pressure bilateral is stable will continue xalatan to both eyes.   7.  Primary osteoarthritis right knee; left index finger: is stable will continue tylenol 650 mg three times daily voltaren gel 4 gm to right knee three times daily left  index finger 2 gm twice daily   8. Essential hypertension: is stable b/p 142/78 will continue asa 81 mg daily  9. Chronic anemia: is stable hgb 10.0; will continue ferrex daily   10. Covid 19: she is presently stable will continue eliquis 2.5 mg twice daily until d-dimer returns to normal; will continue vit c; d and zinc for 2 weeks.         Synthia Innocenteborah Rasheem Figiel NP [facility]  Contact [phone] Monday through Friday 8am- 5pm  After hours call [phone]

## 2021-01-24 ENCOUNTER — Other Ambulatory Visit (HOSPITAL_COMMUNITY)
Admission: RE | Admit: 2021-01-24 | Discharge: 2021-01-24 | Disposition: A | Discharge status: Home / Self Care | Payer: Medicare HMO | Source: Skilled Nursing Facility | Attending: Adult Health | Admitting: Adult Health

## 2021-01-24 DIAGNOSIS — U071 COVID-19: Secondary | ICD-10-CM | POA: Insufficient documentation

## 2021-01-24 LAB — D-DIMER, QUANTITATIVE: D-Dimer, Quant: 6.73 ug/mL-FEU — ABNORMAL HIGH (ref 0.00–0.50)

## 2021-01-25 ENCOUNTER — Non-Acute Institutional Stay (SKILLED_NURSING_FACILITY): Payer: Medicare HMO | Admitting: Adult Health

## 2021-01-25 ENCOUNTER — Encounter: Payer: Self-pay | Admitting: Adult Health

## 2021-01-25 DIAGNOSIS — D6869 Other thrombophilia: Secondary | ICD-10-CM

## 2021-01-25 DIAGNOSIS — U071 COVID-19: Secondary | ICD-10-CM

## 2021-01-25 NOTE — Progress Notes (Signed)
Location:  [facility] Room Number: 105 Place of Service:  SNF (31)   CODE STATUS: full code   No Known Allergies  Chief Complaint  Patient presents with  . Acute Visit    Lab follow up     HPI:  She is covid positive. She is on eliquis for her elevated d-dimer. She tells me that she is feeling better. There are no reports of fevers; no new body aches or headaches.   Past Medical History:  Diagnosis Date  . Abnormal posture    Per [employer], [facility] Electronic Medical Records System   . Anemia   . Cognitive communication deficit    Per [employer], [facility] Records [facility]   . Contracture of left knee    Per [employer], [facility] Records [facility]   . Diabetes mellitus without complication (HCC)   . Difficulty walking    Per [employer], [facility] Records [facility]   . Extended spectrum beta lactamase (ESBL) resistance    Per [employer], [facility] Records [facility]   . Gastroesophageal reflux disease    Per [employer], [facility] Records [facility]   . Glaucoma   . Gram negative sepsis (HCC)    Per [employer], [facility] Records [facility]   . Hypercholesteremia   . Hypertension   . Hypokalemia   . Infection, bacterial    Per [employer], [facility] Electronic Medical Records [facility]   . Muscle weakness (generalized)    Per [employer], [facility] Records [facility]   . Need for assistance with personal care    Per [employer], [facility] Records [facility]   . OAB (overactive bladder)    Per [employer], [facility] Records [facility]   . Ocular hypertension, bilateral    Per [employer], [facility] Records [facility]   . Osteoarthritis of right knee    Per [employer], [facility] Electronic  Medical Records System   . Paraplegia (HCC)   . Pyelonephritis, acute    Per [employer], [facility] Records [facility]   . Status post total right knee replacement   . Unspecified dementia without behavioral disturbance (HCC)    Per [employer], [facility] Electronic Medical Records [facility]   . Urgency of urination    Per [employer], [facility] Records [facility]   . Wheelchair dependence    Per [employer], [facility] Electronic Medical Records System     Past Surgical History:  Procedure Laterality Date  . ABDOMINAL HYSTERECTOMY    . CATARACT EXTRACTION    . COLONOSCOPY N/A 12/10/2013   Procedure: COLONOSCOPY;  Surgeon: West Bali, MD;  Location: [facility];  Service: Endoscopy;  Laterality: N/A;  8:30 AM  . TOTAL KNEE ARTHROPLASTY Right 03/30/2016   Procedure: TOTAL KNEE ARTHROPLASTY;  Surgeon: Vickki Hearing, MD;  Location: [facility];  Service: Orthopedics;  Laterality: Right;    Social History   Socioeconomic History  . Marital status: Divorced    Spouse name: Not on file  . Number of children: Not on file  . Years of education: Not on file  . Highest education level: Not on file  Occupational History  . Occupation: retired   Tobacco Use  . Smoking status: Some Days    Packs/day: 0.25    Years: 3.00  Pack years: 0.75    Types: Cigarettes  . Smokeless tobacco: Never  Vaping Use  . Vaping Use: Never used  Substance and Sexual Activity  . Alcohol use: No  . Drug use: No  . Sexual activity: Not Currently    Birth control/protection: Post-menopausal  Other Topics Concern  . Not on file  Social History Narrative   Long term resident of [facility]    Social Determinants of Health   Financial Resource Strain: Not on file  Food Insecurity: Not on file  Transportation Needs: Not on file  Physical Activity: Inactive  . Days of Exercise per Week: 0 days  . Minutes of Exercise per Session: 0 min  Stress: Not on  file  Social Connections: Not on file  Intimate Partner Violence: Not At Risk  . Fear of Current or Ex-Partner: No  . Emotionally Abused: No  . Physically Abused: No  . Sexually Abused: No   Family History  Problem Relation Age of Onset  . Cancer Mother   . Arthritis Mother   . Hypotension Father   . Cancer Sister   . Hypotension Sister   . Colon cancer Neg Hx       VITAL SIGNS BP (!) 142/71   Pulse 60   Temp 97.9 F (36.6 C)   Resp 18   Ht 5\' 5"  (1.651 m)   Wt 210 lb 6.4 oz (95.4 kg)   SpO2 97%   BMI 35.01 kg/m   Outpatient Encounter Medications as of 01/25/2021  Medication Sig  . acetaminophen (TYLENOL) 325 MG tablet Take 650 mg by mouth 3 (three) times daily.  03/28/2021 ascorbic acid (VITAMIN C) 500 MG tablet Take 500 mg by mouth daily.  Marland Kitchen aspirin EC 81 MG tablet Take 81 mg by mouth daily.  . calcium citrate (CALCITRATE - DOSED IN MG ELEMENTAL CALCIUM) 950 MG tablet Take 200 mg of elemental calcium by mouth daily.  . diclofenac Sodium (VOLTAREN) 1 % GEL Apply 4 g topically 3 (three) times daily as needed. Special Instructions: apply to right knee  . diclofenac Sodium (VOLTAREN) 1 % GEL Apply 2 g topically 2 (two) times daily as needed. Special Instructions: apply to left index finger for arthritis pain  . iron polysaccharides (NIFEREX) 150 MG capsule Take 1 capsule by mouth daily.  Marland Kitchen latanoprost (XALATAN) 0.005 % ophthalmic solution Place 1 drop into both eyes at bedtime. wait 5 minutes between multiple eye drops  . NON FORMULARY Diet: NAS,  . omeprazole (PRILOSEC) 20 MG capsule Take 20 mg by mouth daily. Special Instructions: TAKE 1 CAPSULE BY MOUTH ONCE DAILY *TAKE ON AN EMPTY STOMACH* FOR GERD *DO NOT CRUSH* (FORMULARY SUB FOR PANTOPRAZOLE 40MG ) Once A Day  . oxybutynin (DITROPAN-XL) 10 MG 24 hr tablet Take 10 mg by mouth daily.   No facility-administered encounter medications on file as of 01/25/2021.     SIGNIFICANT DIAGNOSTIC EXAMS   PREVIOUS  09-03-19: ct of  abdomen and pelvis:  1. Wall thickening of the distal esophagus, suspicious foresophagitis or reflux. 2. Slight heterogeneous nephrograms of both kidneys, may representpyelonephritis in the setting of recent urinary tract infection. No focal fluid collection. 3. No CT findings of acute cholecystitis. 4. Trabecular coarsening of the left inferior pubic ramus and ischium, may be sequela of prior trauma or Paget's disease. Aortic Atherosclerosis .  02-05-20: DEXA: t score: -2.279  10-20-20: KUB:  There is a nonspecific bowel gas pattern without obstruction. Moderate to marked increase feces in the colon. No  renal stone is seen.  01-10-21: kub: significant fecal material identified.   01-17-21: chest x-ray: no evidence of acute cardiopulmonary disease; communicable  disease or tuberculosis   NO NEW EXAMS.    LABS REVIEWED PREVIOUS;   02-19-20: glucose 89; bun 23; creat 0.54; k+ 4.1 na++ 137; ca 8.9  03-11-20: k+ 3.9 04-08-20: wbc 6.4; hgb 9.6; hct 32.0; mcv 82.3 plt 372; glucose 91; bun 19; creat 0.56; k+ 3.6; na++ 138; ca 8.7 liver normal albumin 2.9  05-18-20: hgb 10.0; hct 32.9  05-30-20: uric acid: 4.8 10-20-20: wbc 8.2; hgb 13.1; hct 42.8; mcv 80.0 plt 458; glucose 143; bun 17; creat 0.53; k+ 4.4; na++ 131; ca 9.1 GFR>60  liver normal albumin 3.9   01-17-21: wbc 4.3; hgb 10.3; hct 33.6; mcv 81.4 plt 385; glucose 105; bun 11; creat 0.50; k+ 3.5; na++ 134; ca 7.9; GFR >60; d-dimer: 5.93 CRP 5.3  TODAY  01-24-21: d-dimer: 6.73   Review of Systems  Constitutional:  Negative for malaise/fatigue.  Respiratory:  Negative for cough and shortness of breath.   Cardiovascular:  Negative for chest pain, palpitations and leg swelling.  Gastrointestinal:  Negative for abdominal pain, constipation and heartburn.  Musculoskeletal:  Negative for back pain, joint pain and myalgias.  Skin: Negative.   Neurological:  Negative for dizziness.  Psychiatric/Behavioral:  The patient is not nervous/anxious.     Physical Exam Constitutional:      General: She is not in acute distress.    Appearance: She is well-developed. She is not diaphoretic.  Neck:     Thyroid: No thyromegaly.  Cardiovascular:     Rate and Rhythm: Normal rate and regular rhythm.     Pulses: Normal pulses.     Heart sounds: Normal heart sounds.  Pulmonary:     Effort: Pulmonary effort is normal. No respiratory distress.     Breath sounds: Normal breath sounds.  Abdominal:     General: Bowel sounds are normal. There is no distension.     Palpations: Abdomen is soft.     Tenderness: There is no abdominal tenderness.  Musculoskeletal:     Cervical back: Neck supple.     Right lower leg: No edema.     Left lower leg: No edema.     Comments: Is able to move all extremities History of right knee replacement with contracture   Lymphadenopathy:     Cervical: No cervical adenopathy.  Skin:    General: Skin is warm and dry.  Neurological:     Mental Status: She is alert. Mental status is at baseline.  Psychiatric:        Mood and Affect: Mood normal.     ASSESSMENT/ PLAN:  TODAY  Real time reverse transcriptase PCR positive for COVID 19 virus Hypercoagulable state associated with covid 19.   Will continue eliquis 2.5 mg twice daily and will repeat d-dimer in one week will monitor her status.    Synthia Innocent NP [facility]  Contact [phone] Monday through Friday 8am- 5pm  After hours call [phone]

## 2021-01-26 DIAGNOSIS — U071 COVID-19: Secondary | ICD-10-CM | POA: Insufficient documentation

## 2021-01-31 ENCOUNTER — Encounter (HOSPITAL_COMMUNITY)
Admission: RE | Admit: 2021-01-31 | Discharge: 2021-01-31 | Disposition: A | Discharge status: Home / Self Care | Payer: Medicare HMO | Source: Skilled Nursing Facility | Attending: Adult Health | Admitting: Adult Health

## 2021-01-31 DIAGNOSIS — U071 COVID-19: Secondary | ICD-10-CM | POA: Diagnosis not present

## 2021-01-31 LAB — D-DIMER, QUANTITATIVE: D-Dimer, Quant: 5.3 ug/mL-FEU — ABNORMAL HIGH (ref 0.00–0.50)

## 2021-02-01 DIAGNOSIS — U071 COVID-19: Secondary | ICD-10-CM | POA: Insufficient documentation

## 2021-02-07 ENCOUNTER — Encounter (HOSPITAL_COMMUNITY)
Admission: AD | Admit: 2021-02-07 | Discharge: 2021-02-07 | Disposition: A | Discharge status: Home / Self Care | Payer: Medicare HMO | Source: Skilled Nursing Facility | Attending: Adult Health | Admitting: Adult Health

## 2021-02-07 DIAGNOSIS — U071 COVID-19: Secondary | ICD-10-CM | POA: Diagnosis not present

## 2021-02-07 LAB — D-DIMER, QUANTITATIVE: D-Dimer, Quant: 6.25 ug/mL-FEU — ABNORMAL HIGH (ref 0.00–0.50)

## 2021-02-09 ENCOUNTER — Non-Acute Institutional Stay (SKILLED_NURSING_FACILITY): Payer: Medicare HMO | Admitting: Internal Medicine

## 2021-02-09 ENCOUNTER — Encounter: Payer: Self-pay | Admitting: Internal Medicine

## 2021-02-09 DIAGNOSIS — D649 Anemia, unspecified: Secondary | ICD-10-CM

## 2021-02-09 DIAGNOSIS — U071 COVID-19: Secondary | ICD-10-CM

## 2021-02-09 DIAGNOSIS — R7303 Prediabetes: Secondary | ICD-10-CM

## 2021-02-09 DIAGNOSIS — D6869 Other thrombophilia: Secondary | ICD-10-CM

## 2021-02-09 NOTE — Assessment & Plan Note (Signed)
Eliquis prophylaxis with D dimer recheck 7/25

## 2021-02-09 NOTE — Assessment & Plan Note (Signed)
Despite Eliquis prophylaxis anemia stable @ 10.3/33.6

## 2021-02-09 NOTE — Assessment & Plan Note (Signed)
DM with neuro complications Glucose range  @ SNF:89-163 Last  A1c:6% in 2018 A1c goal : < 8% No hypoglycemia No change indicated

## 2021-02-09 NOTE — Patient Instructions (Signed)
See assessment and plan under each diagnosis in the problem list and acutely for this visit 

## 2021-02-09 NOTE — Progress Notes (Signed)
   NURSING HOME LOCATION: [facility] ROOM NUMBER:  105 D  CODE STATUS:  Full Code  PCP:  Synthia Innocent NP  This is a nursing facility follow up visit of chronic medical diagnoses & to document compliance with Regulation 483.30 (c) in The Long Term Care Survey Manual Phase 2 which mandates caregiver visit ( visits can alternate among physician, PA or NP as per statutes) within 10 days of 30 days / 60 days/ 90 days post admission to SNF date    Interim medical record and care since last SNF visit was updated with review of diagnostic studies and change in clinical status since last visit were documented.  HPI: She is a permanent resident of the facility with medical diagnoses of chronic anemia, GERD, glaucoma, essential hypertension, dyslipidemia, overactive bladder, dementia, and diabetes with neurologic complications.  Review of systems: She describes soreness in the left knee.  Veracity of her responses in question as she gave the month as "5" and year as "1920" and the POTUS as "Bane". She went on to confabulate about the operative scar over the right thigh intimating that the female surgeon had caused an injury.  Physical exam:  Pertinent or positive findings: She is markedly hard of hearing.  She is completely edentulous.  Mild rales are present at the bases; breath sounds are decreased over the upper chest.  Slight tachycardia is present.  Abdomen is protuberant.  Pedal pulses are decreased.  There is 1/2+ edema at the sock line.  There is a well-healed elongated operative scar over the right distal thigh.  She has faint ,irregular hypopigmentation over the dorsum of the hands.  There is a flexion contracture of the fifth digit of each hand.  There is also flexion contracture of the right lower extremity.  General appearance: Adequately nourished; no acute distress, increased work of breathing is present.   Lymphatic: No lymphadenopathy about the head, neck,  axilla. Eyes: No conjunctival inflammation or lid edema is present. There is no scleral icterus. Ears:  External ear exam shows no significant lesions or deformities.   Nose:  External nasal examination shows no deformity or inflammation. Nasal mucosa are pink and moist without lesions, exudates Oral exam:  Lips and gums are healthy appearing. There is no oropharyngeal erythema or exudate. Neck:  No thyromegaly, masses, tenderness noted.    Heart:  No murmur, click, rub .  Lungs:  without wheezes, rhonchi,  rubs. Abdomen: Bowel sounds are normal. Abdomen is soft and nontender with no organomegaly, hernias, masses. GU: Deferred  Extremities:  No cyanosis, clubbing  Neurologic exam :Balance, Rhomberg, finger to nose testing could not be completed due to clinical state Skin: Warm & dry w/o tenting. No significant  rash.  See summary under each active problem in the Problem List with associated updated therapeutic plan

## 2021-02-10 NOTE — Assessment & Plan Note (Signed)
Discuss risk:benefit of continuing @ least low dose Eliquis in view of persistent D dimer elevation & tachycardia in context of significant dementia

## 2021-02-14 ENCOUNTER — Encounter: Payer: Self-pay | Admitting: Adult Health

## 2021-02-14 ENCOUNTER — Encounter (HOSPITAL_COMMUNITY)
Admission: RE | Admit: 2021-02-14 | Discharge: 2021-02-14 | Disposition: A | Discharge status: Home / Self Care | Payer: Medicare HMO | Source: Skilled Nursing Facility | Attending: Adult Health | Admitting: Adult Health

## 2021-02-14 ENCOUNTER — Non-Acute Institutional Stay (SKILLED_NURSING_FACILITY): Payer: Medicare HMO | Admitting: Adult Health

## 2021-02-14 DIAGNOSIS — U071 COVID-19: Secondary | ICD-10-CM

## 2021-02-14 DIAGNOSIS — D6869 Other thrombophilia: Secondary | ICD-10-CM | POA: Diagnosis not present

## 2021-02-14 LAB — D-DIMER, QUANTITATIVE: D-Dimer, Quant: 12.74 ug/mL-FEU — ABNORMAL HIGH (ref 0.00–0.50)

## 2021-02-14 NOTE — Progress Notes (Signed)
Location:  [facility] Room Number: 105-D Place of Service:  SNF (31)   CODE STATUS: Full code  No Known Allergies  Chief Complaint  Patient presents with  . Acute Visit    Elevated D-dimer    HPI:  She has had COVID. She remains hypercoagulable with an elevated d-dimer. There are no reports of chest pain or shortness of breath. She is on low dose eliquis. Her d-dimer is currently at 12.74.   Past Medical History:  Diagnosis Date  . Abnormal posture    Per [employer], [facility]   . Anemia   . Cognitive communication deficit    Per [employer], [facility]   . Contracture of left knee    Per [employer], [facility]   . Diabetes mellitus without complication (HCC)   . Difficulty walking    Per [employer], [facility]   . Extended spectrum beta lactamase (ESBL) resistance    Per [employer], [facility]   . Gastroesophageal reflux disease    Per [employer], [facility]   . Glaucoma   . Gram negative sepsis (HCC)    Per [employer], [facility]   . Hypercholesteremia   . Hypertension   . Hypokalemia   . Infection, bacterial    Per [employer], [facility]   . Muscle weakness (generalized)    Per [employer], [facility]   . Need for assistance with personal care    Per [employer], [facility]   . OAB (overactive bladder)    Per [employer], [facility]   . Ocular hypertension, bilateral    Per [employer], [facility]   . Osteoarthritis of right knee    Per [employer], [facility]   . Paraplegia (HCC)   . Pyelonephritis, acute    Per [employer], [facility]   . Status post total right knee replacement   . Unspecified dementia without behavioral disturbance (HCC)    Per [employer], [facility]   . Urgency of urination    Per [employer], [facility]   . Wheelchair dependence    Per [employer], [facility]     Past Surgical History:  Procedure Laterality Date  . ABDOMINAL HYSTERECTOMY    . CATARACT EXTRACTION    . COLONOSCOPY N/A 12/10/2013   Procedure: COLONOSCOPY;  Surgeon: West Bali, MD;  Location: [facility];  Service: Endoscopy;  Laterality: N/A;  8:30 AM  . TOTAL KNEE ARTHROPLASTY Right 03/30/2016   Procedure: TOTAL KNEE ARTHROPLASTY;  Surgeon: Vickki Hearing, MD;  Location: [facility];  Service: Orthopedics;  Laterality: Right;    Social History   Socioeconomic History  . Marital status: Divorced    Spouse name: Not on file  . Number of children: Not on file  . Years of education: Not on file  . Highest education level: Not on file  Occupational History  . Occupation: retired   Tobacco Use  . Smoking status: Some Days    Packs/day: 0.25    Years: 3.00  Pack years: 0.75    Types: Cigarettes  . Smokeless tobacco: Never  Vaping Use  . Vaping Use: Never used  Substance and Sexual Activity  . Alcohol use: No  . Drug use: No  . Sexual activity: Not Currently    Birth control/protection: Post-menopausal  Other Topics Concern  . Not on file  Social History Narrative   Long term resident of [facility]    Social Determinants of Health   Financial Resource Strain: Not on file  Food Insecurity: Not on file  Transportation Needs: Not on file  Physical Activity: Inactive  . Days of Exercise per Week: 0 days  . Minutes of Exercise per Session: 0  min  Stress: Not on file  Social Connections: Not on file  Intimate Partner Violence: Not At Risk  . Fear of Current or Ex-Partner: No  . Emotionally Abused: No  . Physically Abused: No  . Sexually Abused: No   Family History  Problem Relation Age of Onset  . Cancer Mother   . Arthritis Mother   . Hypotension Father   . Cancer Sister   . Hypotension Sister   . Colon cancer Neg Hx       VITAL SIGNS BP 132/83   Pulse 78   Temp 98.6 F (37 C)   Resp (!) 22   Ht 5\' 5"  (1.651 m)   Wt 207 lb 6.4 oz (94.1 kg)   SpO2 90%   BMI 34.51 kg/m   Outpatient Encounter Medications as of 02/14/2021  Medication Sig  . acetaminophen (TYLENOL) 325 MG tablet Take 650 mg by mouth 3 (three) times daily.  02/16/2021 apixaban (ELIQUIS) 5 MG TABS tablet Take 5 mg by mouth 2 (two) times daily. d-dimer: 12.74  . ascorbic acid (VITAMIN C) 500 MG tablet Take 500 mg by mouth daily.  Marland Kitchen aspirin EC 81 MG tablet Take 81 mg by mouth daily.  . calcium citrate (CALCITRATE - DOSED IN MG ELEMENTAL CALCIUM) 950 MG tablet Take 200 mg of elemental calcium by mouth daily.  . diclofenac Sodium (VOLTAREN) 1 % GEL Apply 4 g topically 3 (three) times daily as needed. Special Instructions: apply to right knee  . diclofenac Sodium (VOLTAREN) 1 % GEL Apply 2 g topically 2 (two) times daily as needed. Special Instructions: apply to left index finger for arthritis pain  . iron polysaccharides (NIFEREX) 150 MG capsule Take 1 capsule by mouth daily.  Marland Kitchen latanoprost (XALATAN) 0.005 % ophthalmic solution Place 1 drop into both eyes at bedtime. wait 5 minutes between multiple eye drops  . NON FORMULARY Diet: NAS,  . omeprazole (PRILOSEC) 20 MG capsule Take 20 mg by mouth daily. Special Instructions: TAKE 1 CAPSULE BY MOUTH ONCE DAILY *TAKE ON AN EMPTY STOMACH* FOR GERD *DO NOT CRUSH* (FORMULARY SUB FOR PANTOPRAZOLE 40MG ) Once A Day  . oxybutynin (DITROPAN-XL) 10 MG 24 hr tablet Take 10 mg by mouth daily.   No facility-administered  encounter medications on file as of 02/14/2021.     SIGNIFICANT DIAGNOSTIC EXAMS  PREVIOUS  09-03-19: ct of abdomen and pelvis:  1. Wall thickening of the distal esophagus, suspicious foresophagitis or reflux. 2. Slight heterogeneous nephrograms of both kidneys, may representpyelonephritis in the setting of recent urinary tract infection. No focal fluid collection. 3. No CT findings of acute cholecystitis. 4. Trabecular coarsening of the left inferior pubic ramus and ischium, may be sequela of prior trauma or Paget's disease. Aortic Atherosclerosis .  02-05-20: DEXA: t score: -2.279  10-20-20: KUB:  There is a nonspecific bowel gas pattern without obstruction. Moderate to marked increase feces in the colon. No renal stone is seen.  01-10-21: kub: significant fecal material identified.   01-17-21: chest x-ray: no evidence of acute cardiopulmonary disease; communicable  disease or tuberculosis   NO NEW EXAMS.    LABS REVIEWED PREVIOUS;   02-19-20: glucose 89; bun 23; creat 0.54; k+ 4.1 na++ 137; ca 8.9  03-11-20: k+ 3.9 04-08-20: wbc 6.4; hgb 9.6; hct 32.0; mcv 82.3 plt 372; glucose 91; bun 19; creat 0.56; k+ 3.6; na++ 138; ca 8.7 liver normal albumin 2.9  05-18-20: hgb 10.0; hct 32.9  05-30-20: uric acid: 4.8 10-20-20: wbc 8.2; hgb 13.1; hct 42.8; mcv 80.0 plt 458; glucose 143; bun 17; creat 0.53; k+ 4.4; na++ 131; ca 9.1 GFR>60  liver normal albumin 3.9   01-17-21: wbc 4.3; hgb 10.3; hct 33.6; mcv 81.4 plt 385; glucose 105; bun 11; creat 0.50; k+ 3.5; na++ 134; ca 7.9; GFR >60; d-dimer: 5.93 CRP 5.3  TODAY  01-24-21: d-dimer: 6.73  01-31-21: d-dimer: 5.30 02-07-21: d-dimer: 6.25 02-14-21: d-dimer: 12.74  Review of Systems  Constitutional:  Negative for malaise/fatigue.  Respiratory:  Negative for cough and shortness of breath.   Cardiovascular:  Negative for chest pain, palpitations and leg swelling.  Gastrointestinal:  Negative for abdominal pain, constipation and heartburn.   Musculoskeletal:  Negative for back pain, joint pain and myalgias.  Skin: Negative.   Neurological:  Negative for dizziness.  Psychiatric/Behavioral:  The patient is not nervous/anxious.     Physical Exam Constitutional:      General: She is not in acute distress.    Appearance: She is well-developed. She is obese. She is not diaphoretic.  Neck:     Thyroid: No thyromegaly.  Cardiovascular:     Rate and Rhythm: Normal rate and regular rhythm.     Pulses: Normal pulses.     Heart sounds: Normal heart sounds.  Pulmonary:     Effort: Pulmonary effort is normal. No respiratory distress.     Breath sounds: Normal breath sounds.  Abdominal:     General: Bowel sounds are normal. There is no distension.     Palpations: Abdomen is soft.     Tenderness: There is no abdominal tenderness.  Musculoskeletal:     Cervical back: Neck supple.     Right lower leg: No edema.     Left lower leg: No edema.     Comments: Is able to move all extremities History of right knee replacement with contracture  Lymphadenopathy:     Cervical: No cervical adenopathy.  Skin:    General: Skin is warm and dry.  Neurological:     Mental Status: She is alert. Mental status is at baseline.  Psychiatric:        Mood and Affect: Mood normal.     ASSESSMENT/ PLAN:  TODAY  Hypercoagulable state associated with covid 19  Will increase eliquis to 5 mg twice daily  Will get a ct angio of chest to look for PE.  Will monitor her status.     Synthia Innocent NP [facility]  Contact [phone] Monday through Friday 8am- 5pm  After hours call [phone]

## 2021-02-15 ENCOUNTER — Ambulatory Visit (HOSPITAL_COMMUNITY): Payer: Medicare HMO

## 2021-02-15 LAB — HEMOGLOBIN A1C
Hgb A1c MFr Bld: 6.4 % — ABNORMAL HIGH (ref 4.8–5.6)
Mean Plasma Glucose: 137 mg/dL

## 2021-02-18 ENCOUNTER — Ambulatory Visit (HOSPITAL_COMMUNITY)
Admission: RE | Admit: 2021-02-18 | Discharge: 2021-02-18 | Disposition: A | Discharge status: Home / Self Care | Payer: Medicare HMO | Source: Ambulatory Visit | Attending: Adult Health | Admitting: Adult Health

## 2021-02-18 DIAGNOSIS — R7989 Other specified abnormal findings of blood chemistry: Secondary | ICD-10-CM | POA: Diagnosis present

## 2021-02-18 LAB — POCT I-STAT CREATININE: Creatinine, Ser: 0.6 mg/dL (ref 0.44–1.00)

## 2021-02-18 MED ORDER — IOHEXOL 350 MG/ML SOLN
75.0000 mL | Freq: Once | INTRAVENOUS | Status: AC | PRN
  Administered 2021-02-18: 75 mL via INTRAVENOUS

## 2021-02-24 ENCOUNTER — Encounter: Payer: Self-pay | Admitting: Adult Health

## 2021-02-24 ENCOUNTER — Inpatient Hospital Stay (HOSPITAL_COMMUNITY): Payer: Medicare HMO | Attending: Adult Health

## 2021-02-24 ENCOUNTER — Non-Acute Institutional Stay (SKILLED_NURSING_FACILITY): Payer: Medicare HMO | Admitting: Adult Health

## 2021-02-24 ENCOUNTER — Ambulatory Visit (HOSPITAL_COMMUNITY)
Admission: RE | Admit: 2021-02-24 | Discharge: 2021-02-24 | Disposition: A | Discharge status: Home / Self Care | Payer: Medicare HMO | Source: Ambulatory Visit | Attending: Adult Health | Admitting: Adult Health

## 2021-02-24 ENCOUNTER — Encounter (HOSPITAL_COMMUNITY)
Admission: AD | Admit: 2021-02-24 | Discharge: 2021-02-24 | Disposition: A | Discharge status: Home / Self Care | Payer: Medicare HMO | Source: Skilled Nursing Facility | Attending: Adult Health | Admitting: Adult Health

## 2021-02-24 DIAGNOSIS — K5909 Other constipation: Secondary | ICD-10-CM | POA: Diagnosis not present

## 2021-02-24 DIAGNOSIS — D6869 Other thrombophilia: Secondary | ICD-10-CM | POA: Diagnosis not present

## 2021-02-24 DIAGNOSIS — U071 COVID-19: Secondary | ICD-10-CM

## 2021-02-24 DIAGNOSIS — I82403 Acute embolism and thrombosis of unspecified deep veins of lower extremity, bilateral: Secondary | ICD-10-CM | POA: Insufficient documentation

## 2021-02-24 LAB — D-DIMER, QUANTITATIVE: D-Dimer, Quant: >20 ug/mL-FEU — ABNORMAL HIGH (ref 0.00–0.50)

## 2021-02-24 NOTE — Progress Notes (Signed)
Location:  [facility] Room Number: 105-D Place of Service:  SNF (31)   CODE STATUS: Full Code   No Known Allergies  Chief Complaint  Patient presents with   Acute Visit    Elevated d-dimer     HPI:  She continues with elevated d-dimer. She has failed eliquis therapy will now require therapeutic lovenox therapy. . Her d-dimer had returned >20 from 12.74. there are no reports of shortness of breath or chest pain; no reports of increased lower extremity edema. The long haul clinic is closing and did not see her. We have contacted hematology; was told that since she does not have any active clots; she is not a candidate to be seen by them.   Past Medical History:  Diagnosis Date   Abnormal posture    Per [employer], [facility]    Anemia    Cognitive communication deficit    Per [employer], [facility]    Contracture of left knee    Per [employer], [facility]    Diabetes mellitus without complication (HCC)    Difficulty walking    Per [employer], [facility]    Extended spectrum beta lactamase (ESBL) resistance    Per [employer], [facility]    Gastroesophageal reflux disease    Per [employer], [facility]    Glaucoma    Gram negative sepsis (HCC)    Per [employer], [facility]    Hypercholesteremia    Hypertension    Hypokalemia    Infection, bacterial    Per [employer], [facility]    Muscle weakness (generalized)    Per [employer], [facility]    Need for assistance with personal care    Per [employer], [facility]    OAB (overactive bladder)    Per  [employer], [facility]    Ocular hypertension, bilateral    Per [employer], [facility]    Osteoarthritis of right knee    Per [employer], [facility]    Paraplegia [facility])    Pyelonephritis, acute    Per [employer], [facility]    Status post total right knee replacement    Unspecified dementia without behavioral disturbance (HCC)    Per [employer], [facility]    Urgency of urination    Per [employer], [facility]    Wheelchair dependence    Per [employer], [facility]     Past Surgical History:  Procedure Laterality Date   ABDOMINAL HYSTERECTOMY     CATARACT EXTRACTION     COLONOSCOPY N/A 12/10/2013   Procedure: COLONOSCOPY;  Surgeon: West Bali, MD;  Location: [facility];  Service: Endoscopy;  Laterality: N/A;  8:30 AM   TOTAL KNEE ARTHROPLASTY Right 03/30/2016   Procedure: TOTAL KNEE ARTHROPLASTY;  Surgeon: Vickki Hearing, MD;  Location: [facility];  Service: Orthopedics;  Laterality: Right;    Social History   Socioeconomic History   Marital status: Divorced    Spouse name: Not on file   Number of children: Not on  file   Years of education: Not on file   Highest education level: Not on file  Occupational History   Occupation: retired   Tobacco Use   Smoking status: Some Days    Packs/day: 0.25    Years: 3.00    Pack years: 0.75    Types: Cigarettes   Smokeless tobacco: Never  Vaping Use   Vaping Use: Never used  Substance and Sexual Activity   Alcohol use: No   Drug use: No   Sexual activity: Not Currently    Birth control/protection: Post-menopausal  Other Topics Concern   Not on file  Social History Narrative   Long term resident of [facility]    Social Determinants of Health    Financial Resource Strain: Not on file  Food Insecurity: Not on file  Transportation Needs: Not on file  Physical Activity: Not on file  Stress: Not on file  Social Connections: Not on file  Intimate Partner Violence: Not on file   Family History  Problem Relation Age of Onset   Cancer Mother    Arthritis Mother    Hypotension Father    Cancer Sister    Hypotension Sister    Colon cancer Neg Hx       VITAL SIGNS BP (!) 120/91   Pulse 99   Temp (!) 96.9 F (36.1 C)   Resp 16   Ht 5\' 5"  (1.651 m)   Wt 206 lb (93.4 kg)   SpO2 90%   BMI 34.28 kg/m   Outpatient Encounter Medications as of 02/24/2021  Medication Sig   acetaminophen (TYLENOL) 325 MG tablet Take 650 mg by mouth 2 (two) times daily.   apixaban (ELIQUIS) 5 MG TABS tablet Take 5 mg by mouth 2 (two) times daily. d-dimer: 12.74   ascorbic acid (VITAMIN C) 500 MG tablet Take 500 mg by mouth daily.   aspirin EC 81 MG tablet Take 81 mg by mouth daily.   calcium citrate (CALCITRATE - DOSED IN MG ELEMENTAL CALCIUM) 950 MG tablet Take 200 mg of elemental calcium by mouth daily.   diclofenac Sodium (VOLTAREN) 1 % GEL Apply 4 g topically 3 (three) times daily as needed. Special Instructions: apply to right knee   diclofenac Sodium (VOLTAREN) 1 % GEL Apply 2 g topically 2 (two) times daily as needed. Special Instructions: apply to left index finger for arthritis pain   iron polysaccharides (NIFEREX) 150 MG capsule Take 1 capsule by mouth daily.   latanoprost (XALATAN) 0.005 % ophthalmic solution Place 1 drop into both eyes at bedtime. wait 5 minutes between multiple eye drops   NON FORMULARY Diet: NAS,   omeprazole (PRILOSEC) 20 MG capsule Take 20 mg by mouth daily. Special Instructions: TAKE 1 CAPSULE BY MOUTH ONCE DAILY *TAKE ON AN EMPTY STOMACH* FOR GERD *DO NOT CRUSH* (FORMULARY SUB FOR PANTOPRAZOLE 40MG ) Once A Day   oxybutynin (DITROPAN-XL) 10 MG 24 hr tablet Take 10 mg by mouth daily.   No facility-administered  encounter medications on file as of 02/24/2021.     SIGNIFICANT DIAGNOSTIC EXAMS  PREVIOUS  09-03-19: ct of abdomen and pelvis:  1. Wall thickening of the distal esophagus, suspicious foresophagitis or reflux. 2. Slight heterogeneous nephrograms of both kidneys, may representpyelonephritis in the setting of recent urinary tract infection. No focal fluid collection. 3. No CT findings of acute cholecystitis. 4. Trabecular coarsening of the left inferior pubic ramus and ischium, may be sequela of prior trauma or Paget's disease. Aortic Atherosclerosis .  02-05-20: DEXA:  t score: -2.279  10-20-20: KUB:  There is a nonspecific bowel gas pattern without obstruction. Moderate to marked increase feces in the colon. No renal stone is seen.  01-10-21: kub: significant fecal material identified.   01-17-21: chest x-ray: no evidence of acute cardiopulmonary disease; communicable  disease or tuberculosis   TODAY  02-18-21: ct angio of chest:  1. Negative for acute PE or thoracic aortic dissection. 2.  Aortic Atherosclerosis   02-24-21: bilateral lower extremity venous doppler:  No evidence of deep venous thrombosis in either lower extremity.   02-24-21: KUB Moderate colonic stool burden. No evidence of bowel obstruction.   LABS REVIEWED PREVIOUS;   02-19-20: glucose 89; bun 23; creat 0.54; k+ 4.1 na++ 137; ca 8.9  03-11-20: k+ 3.9 04-08-20: wbc 6.4; hgb 9.6; hct 32.0; mcv 82.3 plt 372; glucose 91; bun 19; creat 0.56; k+ 3.6; na++ 138; ca 8.7 liver normal albumin 2.9  05-18-20: hgb 10.0; hct 32.9  05-30-20: uric acid: 4.8 10-20-20: wbc 8.2; hgb 13.1; hct 42.8; mcv 80.0 plt 458; glucose 143; bun 17; creat 0.53; k+ 4.4; na++ 131; ca 9.1 GFR>60  liver normal albumin 3.9   01-17-21: wbc 4.3; hgb 10.3; hct 33.6; mcv 81.4 plt 385; glucose 105; bun 11; creat 0.50; k+ 3.5; na++ 134; ca 7.9; GFR >60; d-dimer: 5.93 CRP 5.3 01-24-21: d-dimer: 6.73  01-31-21: d-dimer: 5.30 02-07-21: d-dimer: 6.25 02-14-21: d-dimer:  12.74  TODAY  02-24-21: d-dimer: >20  Review of Systems  Constitutional:  Negative for malaise/fatigue.  Respiratory:  Negative for cough and shortness of breath.   Cardiovascular:  Negative for chest pain, palpitations and leg swelling.  Gastrointestinal:  Positive for constipation and nausea. Negative for abdominal pain and heartburn.  Musculoskeletal:  Negative for back pain, joint pain and myalgias.  Skin: Negative.   Neurological:  Negative for dizziness.  Psychiatric/Behavioral:  The patient is not nervous/anxious.    Physical Exam Constitutional:      General: She is not in acute distress.    Appearance: She is well-developed. She is obese. She is not diaphoretic.  Neck:     Thyroid: No thyromegaly.  Cardiovascular:     Rate and Rhythm: Normal rate and regular rhythm.     Pulses: Normal pulses.     Heart sounds: Normal heart sounds.  Pulmonary:     Effort: Pulmonary effort is normal. No respiratory distress.     Breath sounds: Normal breath sounds.  Abdominal:     General: Bowel sounds are normal. There is no distension.     Palpations: Abdomen is soft.     Tenderness: There is no abdominal tenderness.  Musculoskeletal:     Cervical back: Neck supple.     Right lower leg: No edema.     Left lower leg: No edema.  Lymphadenopathy:     Cervical: No cervical adenopathy.  Skin:    General: Skin is warm and dry.  Neurological:     Mental Status: She is alert. Mental status is at baseline.  Psychiatric:        Mood and Affect: Mood normal.      ASSESSMENT/ PLAN:  TODAY  Hypercoagulable state associated with COVID 19 Chronic constipation  Is worse:   Will stop eliquis and will start lovenox 100 mg twice daily  Will repeat d-dimer 03-10-32:  Will begin miralax daily  Will monitor her status.    Synthia Innocent NP [facility]  Contact [phone] Monday through Friday 8am- 5pm  After hours call  [phone]   

## 2021-02-25 ENCOUNTER — Ambulatory Visit: Payer: Medicare HMO | Admitting: Nurse Practitioner

## 2021-03-01 ENCOUNTER — Encounter (HOSPITAL_COMMUNITY)
Admission: RE | Admit: 2021-03-01 | Discharge: 2021-03-01 | Disposition: A | Discharge status: Home / Self Care | Payer: Medicare HMO | Source: Skilled Nursing Facility | Attending: Adult Health | Admitting: Adult Health

## 2021-03-01 DIAGNOSIS — U071 COVID-19: Secondary | ICD-10-CM | POA: Diagnosis not present

## 2021-03-01 LAB — D-DIMER, QUANTITATIVE: D-Dimer, Quant: 14.18 ug/mL-FEU — ABNORMAL HIGH (ref 0.00–0.50)

## 2021-03-10 ENCOUNTER — Encounter (HOSPITAL_COMMUNITY)
Admission: RE | Admit: 2021-03-10 | Discharge: 2021-03-10 | Disposition: A | Discharge status: Home / Self Care | Payer: Medicare HMO | Source: Skilled Nursing Facility | Attending: Adult Health | Admitting: Adult Health

## 2021-03-10 ENCOUNTER — Non-Acute Institutional Stay (SKILLED_NURSING_FACILITY): Payer: Medicare HMO | Admitting: Adult Health

## 2021-03-10 DIAGNOSIS — D6869 Other thrombophilia: Secondary | ICD-10-CM | POA: Diagnosis not present

## 2021-03-10 DIAGNOSIS — U071 COVID-19: Secondary | ICD-10-CM | POA: Diagnosis not present

## 2021-03-10 LAB — D-DIMER, QUANTITATIVE: D-Dimer, Quant: 11.1 ug/mL-FEU — ABNORMAL HIGH (ref 0.00–0.50)

## 2021-03-10 NOTE — Progress Notes (Signed)
Location:  [facility] Room Number: 105 Place of Service:  SNF (31)   CODE STATUS: full code   No Known Allergies  Chief Complaint  Patient presents with   Acute Visit    Lab follow up     HPI:  She continues on therapeutic lovenox due to elevated d-dimer. The MAX was >20. Her current reading is 11.10. there are no indications of clots present. Her testing has been negative. She denies any uncontrolled pain.   Past Medical History:  Diagnosis Date   Abnormal posture    Per [facility], [facility]    Anemia    Cognitive communication deficit    Per [facility], [facility]    Contracture of left knee    Per [facility], [facility]    Diabetes mellitus without complication (HCC)    Difficulty walking    Per [facility], [facility]    Extended spectrum beta lactamase (ESBL) resistance    Per [facility], [facility]    Gastroesophageal reflux disease    Per [facility], [facility]    Glaucoma    Gram negative sepsis (HCC)    Per [facility], [facility]    Hypercholesteremia    Hypertension    Hypokalemia    Infection, bacterial    Per [facility], [facility]    Muscle weakness (generalized)    Per [facility], [facility]    Need for assistance with personal care    Per [facility], [facility]    OAB (overactive bladder)    Per [facility], [facility]    Ocular hypertension, bilateral    Per [facility], [facility]    Osteoarthritis of right knee    Per [facility], [facility]    Paraplegia [facility])    Pyelonephritis, acute    Per [facility], [facility]    Status post total right knee replacement    Unspecified dementia without behavioral disturbance (HCC)    Per [facility], [facility]    Urgency of urination    Per [facility], [facility]    Wheelchair dependence    Per Ria Bush, [facility]     Past Surgical History:  Procedure Laterality Date   ABDOMINAL HYSTERECTOMY     CATARACT EXTRACTION     COLONOSCOPY N/A 12/10/2013   Procedure: COLONOSCOPY;  Surgeon: West Bali, MD;  Location: [facility];  Service: Endoscopy;  Laterality: N/A;  8:30 AM   TOTAL KNEE ARTHROPLASTY Right 03/30/2016   Procedure: TOTAL KNEE ARTHROPLASTY;  Surgeon: Vickki Hearing, MD;  Location: [facility] ORS;  Service: Orthopedics;  Laterality: Right;    Social History   Socioeconomic History   Marital status: Divorced    Spouse name: Not on file   Number of children: Not on file   Years of education: Not on file   Highest education level: Not on file  Occupational History   Occupation: retired   Tobacco Use   Smoking status: Some Days    Packs/day: 0.25  Years: 3.00    Pack years: 0.75    Types: Cigarettes   Smokeless tobacco: Never  Vaping Use   Vaping Use: Never used  Substance and Sexual Activity   Alcohol use: No   Drug use: No   Sexual activity: Not Currently    Birth control/protection: Post-menopausal  Other Topics Concern   Not on file  Social History Narrative   Long term resident of [facility]    Social Determinants of Health   Financial Resource Strain: Not on file  Food Insecurity: Not on file  Transportation Needs: Not on file  Physical Activity: Not on file  Stress: Not on file  Social Connections: Not on file  Intimate Partner Violence: Not on file    Family History  Problem Relation Age of Onset   Cancer Mother    Arthritis Mother    Hypotension Father    Cancer Sister    Hypotension Sister    Colon cancer Neg Hx       VITAL SIGNS BP 122/80   Pulse 90   Temp 97.7 F (36.5 C)   Ht 5\' 5"  (1.651 m)   Wt 206 lb (93.4 kg)   BMI 34.28 kg/m   Outpatient Encounter Medications as of 03/10/2021  Medication Sig   acetaminophen (TYLENOL) 325 MG tablet Take 650 mg by mouth 2 (two) times daily.   ascorbic acid (VITAMIN C) 500 MG tablet Take 500 mg by mouth daily.   aspirin EC 81 MG tablet Take 81 mg by mouth daily.   calcium citrate (CALCITRATE - DOSED IN MG ELEMENTAL CALCIUM) 950 MG tablet Take 200 mg of elemental calcium by mouth daily.   diclofenac Sodium (VOLTAREN) 1 % GEL Apply 4 g topically 3 (three) times daily as needed. Special Instructions: apply to right knee   diclofenac Sodium (VOLTAREN) 1 % GEL Apply 2 g topically 2 (two) times daily as needed. Special Instructions: apply to left index finger for arthritis pain   enoxaparin (LOVENOX) 100 MG/ML injection Inject 100 mg into the skin every 12 (twelve) hours.   iron polysaccharides (NIFEREX) 150 MG capsule Take 1 capsule by mouth daily.   latanoprost (XALATAN) 0.005 % ophthalmic solution Place 1 drop into both eyes at bedtime. wait 5 minutes between multiple eye drops   NON FORMULARY Diet: NAS,   omeprazole (PRILOSEC) 20 MG capsule Take 20 mg by mouth daily. Special Instructions: TAKE 1 CAPSULE BY MOUTH ONCE DAILY *TAKE ON AN EMPTY STOMACH* FOR GERD *DO NOT CRUSH* (FORMULARY SUB FOR PANTOPRAZOLE 40MG ) Once A Day   oxybutynin (DITROPAN-XL) 10 MG 24 hr tablet Take 10 mg by mouth daily.   No facility-administered encounter medications on file as of 03/10/2021.     SIGNIFICANT DIAGNOSTIC EXAMS  PREVIOUS  02-05-20: DEXA: t score: -2.279  10-20-20: KUB:  There is a nonspecific bowel gas pattern without obstruction. Moderate to marked increase feces in the colon. No renal  stone is seen.  01-10-21: kub: significant fecal material identified.   01-17-21: chest x-ray: no evidence of acute cardiopulmonary disease; communicable  disease or tuberculosis   02-18-21: ct angio of chest:  1. Negative for acute PE or thoracic aortic dissection. 2.  Aortic Atherosclerosis   02-24-21: bilateral lower extremity venous doppler:  No evidence of deep venous thrombosis in either lower extremity.   02-24-21: KUB Moderate colonic stool burden. No evidence of bowel obstruction.  NO NEW EXAMS.    LABS REVIEWED PREVIOUS;   03-11-20: k+ 3.9 04-08-20: wbc 6.4; hgb  9.6; hct 32.0; mcv 82.3 plt 372; glucose 91; bun 19; creat 0.56; k+ 3.6; na++ 138; ca 8.7 liver normal albumin 2.9  05-18-20: hgb 10.0; hct 32.9  05-30-20: uric acid: 4.8 10-20-20: wbc 8.2; hgb 13.1; hct 42.8; mcv 80.0 plt 458; glucose 143; bun 17; creat 0.53; k+ 4.4; na++ 131; ca 9.1 GFR>60  liver normal albumin 3.9   01-17-21: wbc 4.3; hgb 10.3; hct 33.6; mcv 81.4 plt 385; glucose 105; bun 11; creat 0.50; k+ 3.5; na++ 134; ca 7.9; GFR >60; d-dimer: 5.93 CRP 5.3 01-24-21: d-dimer: 6.73  01-31-21: d-dimer: 5.30 02-07-21: d-dimer: 6.25 02-14-21: d-dimer: 12.74 02-24-21: d-dimer: >20  TODAY  03-01-21: d-dimer: 14.18 03-10-21: d-dimer: 11.10  Review of Systems  Constitutional:  Negative for malaise/fatigue.  Respiratory:  Negative for cough and shortness of breath.   Cardiovascular:  Negative for chest pain, palpitations and leg swelling.  Gastrointestinal:  Negative for abdominal pain, constipation and heartburn.  Musculoskeletal:  Negative for back pain, joint pain and myalgias.  Skin: Negative.   Neurological:  Negative for dizziness.  Psychiatric/Behavioral:  The patient is not nervous/anxious.     Physical Exam Constitutional:      General: She is not in acute distress.    Appearance: She is well-developed. She is obese. She is not diaphoretic.  Neck:     Thyroid: No thyromegaly.  Cardiovascular:     Rate and  Rhythm: Normal rate and regular rhythm.     Pulses: Normal pulses.     Heart sounds: Normal heart sounds.  Pulmonary:     Effort: Pulmonary effort is normal. No respiratory distress.     Breath sounds: Normal breath sounds.  Abdominal:     General: Bowel sounds are normal. There is no distension.     Palpations: Abdomen is soft.     Tenderness: There is no abdominal tenderness.  Musculoskeletal:        General: Normal range of motion.     Cervical back: Neck supple.     Right lower leg: No edema.     Left lower leg: No edema.  Lymphadenopathy:     Cervical: No cervical adenopathy.  Skin:    General: Skin is warm and dry.  Neurological:     Mental Status: She is alert. Mental status is at baseline.  Psychiatric:        Mood and Affect: Mood normal.     ASSESSMENT/ PLAN:  TODAY  Hypercoagulable stat associated with COVID 10      Doe d-dimer of 11.10; will continue lovenox at 100 mg twice daily and will repeat on 03-17-21    Synthia Innocent NP [facility]  Contact [phone] Monday through Friday 8am- 5pm  After hours call [phone]

## 2021-03-14 ENCOUNTER — Non-Acute Institutional Stay (SKILLED_NURSING_FACILITY): Payer: Medicare HMO | Admitting: Adult Health

## 2021-03-14 ENCOUNTER — Encounter: Payer: Self-pay | Admitting: Adult Health

## 2021-03-14 DIAGNOSIS — Z0000 Encounter for general adult medical examination without abnormal findings: Secondary | ICD-10-CM | POA: Diagnosis not present

## 2021-03-14 NOTE — Patient Instructions (Signed)
  Anna Hanna , Thank you for taking time to come for your Medicare Wellness Visit. I appreciate your ongoing commitment to your health goals. Please review the following plan we discussed and let me know if I can assist you in the future.   These are the goals we discussed:  Goals      DIET - INCREASE WATER INTAKE     Follow up with Provider as scheduled     General - Client will not be readmitted within 30 days ([facility])        This is a list of the screening recommended for you and due dates:  Health Maintenance  Topic Date Due   Tetanus Vaccine  Never done   Zoster (Shingles) Vaccine (1 of 2) Never done   Flu Shot  02/21/2021   Pneumonia vaccines (1 of 2 - PCV13) 06/08/2021*   DEXA scan (bone density measurement)  Completed   COVID-19 Vaccine  Completed   HPV Vaccine  Aged Out  *Topic was postponed. The date shown is not the original due date.

## 2021-03-14 NOTE — Progress Notes (Signed)
Location:  [facility] Room Number: 105-D Place of Service:  SNF (31)   CODE STATUS: Full code  No Known Allergies  Chief Complaint  Patient presents with   Medical Management of Chronic Issues    Annual Wellness Exam    HPI:    Past Medical History:  Diagnosis Date   Abnormal posture    Per [facility], [facility] Records [facility]    Anemia    Cognitive communication deficit    Per [facility], [facility]    Contracture of left knee    Per [facility], [facility]    Diabetes mellitus without complication (HCC)    Difficulty walking    Per [facility], [facility]    Extended spectrum beta lactamase (ESBL) resistance    Per [facility], [facility]    Gastroesophageal reflux disease    Per [facility], [facility]    Glaucoma    Gram negative sepsis (HCC)    Per [facility], [facility]    Hypercholesteremia    Hypertension    Hypokalemia    Infection, bacterial    Per [facility], [facility]    Muscle weakness (generalized)    Per [facility], [facility]    Need for assistance with personal care    Per [facility], [facility]    OAB (overactive bladder)    Per [facility], [facility]    Ocular hypertension, bilateral    Per [facility], [facility]    Osteoarthritis of right knee    Per [facility], [facility]    Paraplegia [facility])    Pyelonephritis, acute    Per [facility], [facility]    Status post total  right knee replacement    Unspecified dementia without behavioral disturbance (HCC)    Per [facility], [facility]    Urgency of urination    Per [facility], [facility]    Wheelchair dependence    Per Ria Bush, [facility] Records [facility]     Past Surgical History:  Procedure Laterality Date   ABDOMINAL HYSTERECTOMY     CATARACT EXTRACTION     COLONOSCOPY N/A 12/10/2013   Procedure: COLONOSCOPY;  Surgeon: West Bali, MD;  Location: [facility];  Service: Endoscopy;  Laterality: N/A;  8:30 AM   TOTAL KNEE ARTHROPLASTY Right 03/30/2016   Procedure: TOTAL KNEE ARTHROPLASTY;  Surgeon: Vickki Hearing, MD;  Location: [facility];  Service: Orthopedics;  Laterality: Right;    Social History   Socioeconomic History   Marital status: Divorced    Spouse name: Not on file   Number of children: Not on file   Years of education: Not on file   Highest education level: Not on file  Occupational History   Occupation: retired   Tobacco Use   Smoking status: Some Days    Packs/day: 0.25    Years: 3.00    Pack years: 0.75    Types: Cigarettes   Smokeless tobacco: Never  Vaping Use   Vaping Use: Never used  Substance and Sexual Activity  Alcohol use: No   Drug use: No   Sexual activity: Not Currently    Birth control/protection: Post-menopausal  Other Topics Concern   Not on file  Social History Narrative   Long term resident of [facility]    Social Determinants of Health   Financial Resource Strain: Not on file  Food Insecurity: Not on file  Transportation Needs: Not on file  Physical Activity: Not on file  Stress: Not on file  Social Connections: Not on file  Intimate Partner Violence: Not on file   Family History  Problem Relation Age of Onset   Cancer Mother    Arthritis Mother    Hypotension Father    Cancer Sister    Hypotension Sister    Colon cancer Neg Hx        VITAL SIGNS BP 129/81   Pulse 88   Temp (!) 97.5 F (36.4 C)   Resp 16   Ht 5\' 5"  (1.651 m)   Wt 206 lb (93.4 kg)   SpO2 90%   BMI 34.28 kg/m   Outpatient Encounter Medications as of 03/14/2021  Medication Sig   acetaminophen (TYLENOL) 325 MG tablet Take 650 mg by mouth 2 (two) times daily.   ascorbic acid (VITAMIN C) 500 MG tablet Take 500 mg by mouth daily.   aspirin EC 81 MG tablet Take 81 mg by mouth daily.   calcium citrate (CALCITRATE - DOSED IN MG ELEMENTAL CALCIUM) 950 MG tablet Take 200 mg of elemental calcium by mouth daily.   diclofenac Sodium (VOLTAREN) 1 % GEL Apply 4 g topically 3 (three) times daily as needed. Special Instructions: apply to right knee   diclofenac Sodium (VOLTAREN) 1 % GEL Apply 2 g topically 2 (two) times daily as needed. Special Instructions: apply to left index finger for arthritis pain   enoxaparin (LOVENOX) 100 MG/ML injection Inject 100 mg into the skin every 12 (twelve) hours.   iron polysaccharides (NIFEREX) 150 MG capsule Take 1 capsule by mouth daily.   latanoprost (XALATAN) 0.005 % ophthalmic solution Place 1 drop into both eyes at bedtime. wait 5 minutes between multiple eye drops   NON FORMULARY Diet: NAS,   omeprazole (PRILOSEC) 20 MG capsule Take 20 mg by mouth daily. Special Instructions: TAKE 1 CAPSULE BY MOUTH ONCE DAILY *TAKE ON AN EMPTY STOMACH* FOR GERD *DO NOT CRUSH* (FORMULARY SUB FOR PANTOPRAZOLE 40MG ) Once A Day   oxybutynin (DITROPAN-XL) 10 MG 24 hr tablet Take 10 mg by mouth daily.   polyethylene glycol powder (MIRALAX) 17 GM/SCOOP powder Take by mouth once.   No facility-administered encounter medications on file as of 03/14/2021.     SIGNIFICANT DIAGNOSTIC EXAMS       ASSESSMENT/ PLAN:     NP [facility]  Contact [phone] Monday through Friday 8am- 5pm  After hours call [phone]

## 2021-03-14 NOTE — Progress Notes (Signed)
Subjective:   Anna Hanna is a 86 y.o. female who presents for Medicare Annual (Subsequent) preventive examination.  Review of Systems    Review of Systems  Constitutional:  Negative for malaise/fatigue.  Respiratory:  Negative for cough and shortness of breath.   Cardiovascular:  Negative for chest pain, palpitations and leg swelling.  Gastrointestinal:  Negative for abdominal pain, constipation and heartburn.  Musculoskeletal:  Negative for back pain, joint pain and myalgias.  Skin: Negative.   Neurological:  Negative for dizziness.  Psychiatric/Behavioral:  The patient is not nervous/anxious.    Cardiac Risk Factors include: advanced age (>21men, >65 women);obesity (BMI >30kg/m2);sedentary lifestyle     Objective:    Today's Vitals   03/14/21 1111 03/14/21 1523  BP: 129/81   Pulse: 88   Resp: 16   Temp: (!) 97.5 F (36.4 C)   SpO2: 90%   Weight: 206 lb (93.4 kg)   Height: 5\' 5"  (1.651 m)   PainSc:  0-No pain   Body mass index is 34.28 kg/m.  Advanced Directives 03/14/2021 02/24/2021 02/14/2021 12/16/2020 12/13/2020 11/16/2020 10/20/2020  Does Patient Have a Medical Advance Directive? No No No No No No No  Type of Advance Directive - - - - - - -  Does patient want to make changes to medical advance directive? No - Patient declined No - Patient declined No - Patient declined No - Patient declined No - Patient declined No - Patient declined No - Patient declined  Copy of Healthcare Power of Attorney in Chart? - - - - - - -  Would patient like information on creating a medical advance directive? - - - - - - -  Pre-existing out of facility DNR order (yellow form or pink MOST form) - - - - - - -    Current Medications (verified) Outpatient Encounter Medications as of 03/14/2021  Medication Sig   acetaminophen (TYLENOL) 325 MG tablet Take 650 mg by mouth 2 (two) times daily.   ascorbic acid (VITAMIN C) 500 MG tablet Take 500 mg by mouth daily.   aspirin EC 81 MG tablet Take  81 mg by mouth daily.   calcium citrate (CALCITRATE - DOSED IN MG ELEMENTAL CALCIUM) 950 MG tablet Take 200 mg of elemental calcium by mouth daily.   diclofenac Sodium (VOLTAREN) 1 % GEL Apply 4 g topically 3 (three) times daily as needed. Special Instructions: apply to right knee   diclofenac Sodium (VOLTAREN) 1 % GEL Apply 2 g topically 2 (two) times daily as needed. Special Instructions: apply to left index finger for arthritis pain   enoxaparin (LOVENOX) 100 MG/ML injection Inject 100 mg into the skin every 12 (twelve) hours.   iron polysaccharides (NIFEREX) 150 MG capsule Take 1 capsule by mouth daily.   latanoprost (XALATAN) 0.005 % ophthalmic solution Place 1 drop into both eyes at bedtime. wait 5 minutes between multiple eye drops   NON FORMULARY Diet: NAS,   omeprazole (PRILOSEC) 20 MG capsule Take 20 mg by mouth daily. Special Instructions: TAKE 1 CAPSULE BY MOUTH ONCE DAILY *TAKE ON AN EMPTY STOMACH* FOR GERD *DO NOT CRUSH* (FORMULARY SUB FOR PANTOPRAZOLE 40MG ) Once A Day   oxybutynin (DITROPAN-XL) 10 MG 24 hr tablet Take 10 mg by mouth daily.   polyethylene glycol powder (MIRALAX) 17 GM/SCOOP powder Take by mouth once.   No facility-administered encounter medications on file as of 03/14/2021.    Allergies (verified) Patient has no known allergies.   History: Past Medical History:  Diagnosis Date   Abnormal posture    Per [facility] Records [facility]    Anemia    Cognitive communication deficit    Per [facility] Records [facility]    Contracture of left knee    Per [facility] Records [facility]    Diabetes mellitus without complication (HCC)    Difficulty walking    Per [facility] Records [facility]    Extended spectrum beta lactamase (ESBL) resistance    Per [facility] Records [facility]     Gastroesophageal reflux disease    Per [facility] Records [facility]    Glaucoma    Gram negative sepsis (HCC)    Per [facility] Records [facility]    Hypercholesteremia    Hypertension    Hypokalemia    Infection, bacterial    Per [facility] Records [facility]    Muscle weakness (generalized)    Per [facility]    Need for assistance with personal care    Per [facility]    OAB (overactive bladder)    Per [facility] Records [facility]    Ocular hypertension, bilateral    Per [facility]    Osteoarthritis of right knee    Per [facility] Records [facility]    Paraplegia [facility](HCC)    Pyelonephritis, acute    Per [facility] Records [facility]    Status post total right knee replacement    Unspecified dementia without behavioral disturbance (HCC)    Per [facility] Records [facility]    Urgency of urination    Per [facility]    Wheelchair dependence    Per [facility]    Past Surgical History:  Procedure Laterality Date   ABDOMINAL HYSTERECTOMY     CATARACT EXTRACTION     COLONOSCOPY N/A 12/10/2013   Procedure: COLONOSCOPY;  Surgeon: West BaliSandi L Fields, MD;  Location: [facility];  Service: Endoscopy;  Laterality: N/A;  8:30 AM   TOTAL KNEE ARTHROPLASTY Right 03/30/2016   Procedure: TOTAL KNEE ARTHROPLASTY;  Surgeon: Vickki HearingStanley E Harrison, MD;  Location: [facility];  Service: Orthopedics;  Laterality: Right;   Family History  Problem Relation Age of Onset   Cancer Mother    Arthritis Mother     Hypotension Father    Cancer Sister    Hypotension Sister    Colon cancer Neg Hx    Social History   Socioeconomic History   Marital status: Divorced    Spouse name: Not on file   Number of children: Not on file   Years of education: Not on file   Highest education level: Not on file  Occupational History   Occupation: retired   Tobacco Use   Smoking status: Some Days    Packs/day: 0.25    Years: 3.00    Pack years: 0.75    Types: Cigarettes   Smokeless tobacco: Never  Vaping Use   Vaping Use: Never used  Substance and Sexual Activity   Alcohol use: No   Drug use: No   Sexual activity: Not Currently  Birth control/protection: Post-menopausal  Other Topics Concern   Not on file  Social History Narrative   Long term resident of [facility]    Social Determinants of Health   Financial Resource Strain: Not on file  Food Insecurity: Not on file  Transportation Needs: Not on file  Physical Activity: Not on file  Stress: Not on file  Social Connections: Not on file    Tobacco Counseling Ready to quit: Not Answered Counseling given: Not Answered   Clinical Intake:  Pre-visit preparation completed: Yes  Pain : No/denies pain Pain Score: 0-No pain Faces Pain Scale: No hurt  Faces Pain Scale: No hurt  BMI - recorded: 34.28 Nutritional Status: BMI > 30  Obese Nutritional Risks: Unintentional weight gain Diabetes: No  How often do you need to have someone help you when you read instructions, pamphlets, or other written materials from your doctor or pharmacy?: 5 - Always  Diabetic?no  Interpreter Needed?: No      Activities of Daily Living In your present state of health, do you have any difficulty performing the following activities: 03/14/2021  Hearing? N  Vision? N  Difficulty concentrating or making decisions? Y  Walking or climbing stairs? Y  Dressing or bathing? Y  Doing errands, shopping? Y  Preparing Food and eating ? Y  Using the Toilet? Y  In  the past six months, have you accidently leaked urine? Y  Do you have problems with loss of bowel control? Y  Managing your Medications? Y  Managing your Finances? Y  Housekeeping or managing your Housekeeping? Y  Some recent data might be hidden    Patient Care Team: Sharee Holster, NP as PCP - [facility]  Indicate any recent Medical Services you may have received from other than [facility] providers in the past year (date may be approximate).     Assessment:   This is a routine wellness examination for Marjo.  Hearing/Vision screen No results found.  Dietary issues and exercise activities discussed: Current Exercise Habits: The patient does not participate in regular exercise at present   Goals Addressed             This Visit's Progress    DIET - INCREASE WATER INTAKE   On track    Follow up with Provider as scheduled   On track    General - Client will not be readmitted within 30 days ([facility])   On track      Depression Screen PHQ 2/9 Scores 03/14/2021 02/17/2020  PHQ - 2 Score 0 0    Fall Risk Fall Risk  03/14/2021 02/17/2020  Falls in the past year? 0 0  Number falls in past yr: 0 -  Injury with Fall? 0 -  Risk for fall due to : - Impaired balance/gait;Impaired mobility    FALL RISK PREVENTION PERTAINING TO THE HOME:  Any stairs in or around the home? Yes  If so, are there any without handrails? Yes  Home free of loose throw rugs in walkways, pet beds, electrical cords, etc? Yes  Adequate lighting in your home to reduce risk of falls? Yes   ASSISTIVE DEVICES UTILIZED TO PREVENT FALLS:  Life alert? No  Use of a cane, walker or w/c? Yes  Grab bars in the bathroom? Yes  Shower chair or bench in shower? Yes  Elevated toilet seat or a handicapped toilet? Yes   TIMED UP AND GO:  Was the test performed? No .  Length of time to ambulate 10 feet: non ambulatory  sec.   Gait unsteady with use of  assistive device, provider informed and education provided.   Cognitive Function: MMSE - Mini Mental State Exam 03/14/2021  Orientation to time 4  Orientation to Place 3  Registration 3  Attention/ Calculation 2  Recall 1  Language- name 2 objects 2  Language- repeat 1  Language- follow 3 step command 3  Language- read & follow direction 1  Write a sentence 0  Copy design 0  Total score 20     6CIT Screen 03/14/2021 02/17/2020  What Year? 4 points 4 points  What month? 3 points -  What time? 3 points -  Count back from 20 4 points -  Months in reverse 4 points -  Repeat phrase 6 points -  Total Score 24 -    Immunizations Immunization History  Administered Date(s) Administered   Influenza-Unspecified 04/30/2020   Moderna SARS-COV2 Booster Vaccination 11/03/2020   Moderna Sars-Covid-2 Vaccination 08/03/2019, 08/31/2019, 05/27/2020   PPD Test 10/03/2016    TDAP status: Up to date  Flu Vaccine status: Up to date  Pneumococcal vaccine status: Up to date  Covid-19 vaccine status: Completed vaccines  Qualifies for Shingles Vaccine? Yes   Zostavax completed No   Shingrix Completed?: No.    Education has been provided regarding the importance of this vaccine. Patient has been advised to call insurance company to determine out of pocket expense if they have not yet received this vaccine. Advised may also receive vaccine at local pharmacy or Health Dept. Verbalized acceptance and understanding.  Screening Tests Health Maintenance  Topic Date Due   TETANUS/TDAP  Never done   Zoster Vaccines- Shingrix (1 of 2) Never done   INFLUENZA VACCINE  02/21/2021   PNA vac Low Risk Adult (1 of 2 - PCV13) 06/08/2021 (Originally 09/18/2001)   DEXA SCAN  Completed   COVID-19 Vaccine  Completed   HPV VACCINES  Aged Out    Health Maintenance  Health Maintenance Due  Topic Date Due   TETANUS/TDAP  Never done   Zoster Vaccines- Shingrix (1 of 2) Never done   INFLUENZA VACCINE   02/21/2021    Colorectal cancer screening: No longer required.   Mammogram status: No longer required due to age.  Bone Density status: Completed 02-05-20. Results reflect: Bone density results: OSTEOPOROSIS. Repeat every 2 years.  Lung Cancer Screening: (Low Dose CT Chest recommended if Age 29-80 years, 30 pack-year currently smoking OR have quit w/in 15years.) does not qualify.   Lung Cancer Screening Referral: n/a  Additional Screening:  Hepatitis C Screening: does not qualify; Completed   Vision Screening: Recommended annual ophthalmology exams for early detection of glaucoma and other disorders of the eye. Is the patient up to date with their annual eye exam?  Yes  Who is the provider or what is the name of the office in which the patient attends annual eye exams?  If pt is not established with a provider, would they like to be referred to a provider to establish care? No .   Dental Screening: Recommended annual dental exams for proper oral hygiene  Community Resource Referral / Chronic Care Management: CRR required this visit?  No   CCM required this visit?  No      Plan:     I have personally reviewed and noted the following in the patient's chart:   Medical and social history Use of alcohol, tobacco or illicit drugs  Current medications and supplements including opioid prescriptions.  Functional ability and status Nutritional status Physical activity Advanced directives List of other physicians Hospitalizations, surgeries, and ER visits in previous 12 months Vitals Screenings to include cognitive, depression, and falls Referrals and appointments  In addition, I have reviewed and discussed with patient certain preventive protocols, quality metrics, and best practice recommendations. A written personalized care plan for preventive services as well as general preventive health recommendations were provided to patient.     Sharee Holster, NP   03/14/2021    Nurse Notes:

## 2021-03-17 ENCOUNTER — Encounter: Payer: Self-pay | Admitting: Adult Health

## 2021-03-17 ENCOUNTER — Other Ambulatory Visit (HOSPITAL_COMMUNITY)
Admission: RE | Admit: 2021-03-17 | Discharge: 2021-03-17 | Disposition: A | Discharge status: Home / Self Care | Payer: Medicare HMO | Source: Skilled Nursing Facility | Attending: Adult Health | Admitting: Adult Health

## 2021-03-17 ENCOUNTER — Non-Acute Institutional Stay (SKILLED_NURSING_FACILITY): Payer: Medicare HMO | Admitting: Adult Health

## 2021-03-17 DIAGNOSIS — E441 Mild protein-calorie malnutrition: Secondary | ICD-10-CM

## 2021-03-17 DIAGNOSIS — U071 COVID-19: Secondary | ICD-10-CM | POA: Insufficient documentation

## 2021-03-17 DIAGNOSIS — I700 Atherosclerosis of aorta: Secondary | ICD-10-CM

## 2021-03-17 DIAGNOSIS — F0390 Unspecified dementia without behavioral disturbance: Secondary | ICD-10-CM

## 2021-03-17 DIAGNOSIS — D6869 Other thrombophilia: Secondary | ICD-10-CM

## 2021-03-17 LAB — D-DIMER, QUANTITATIVE: D-Dimer, Quant: 10.44 ug/mL-FEU — ABNORMAL HIGH (ref 0.00–0.50)

## 2021-03-17 NOTE — Progress Notes (Signed)
Location:  [facility] Room Number: 105 Place of Service:  SNF ([id number])   CODE STATUS: full code   No Known Allergies  Chief Complaint  Patient presents with   Acute Visit    Care plan meeting.     HPI:  We have come together for her care plan meeting:   BIMS 10/15 mood 0/30. She requires extensive assist with adls. She is able to feed herself. She is incontinent of bladder and bowel. There have been no falls. She continues with lovenox for her continued elevated d-dimer. Dietary:   weight is 206 pounds diet NAS appetite is good she has lost 6 pounds which is desirable. Therapy wheelchair mobility.  She remains on lovenox due to elevated d-dimer. She continues to be followed for her chronic illnesses including: Aortic atherosclerosis  Dementia without behavioral disturbance unspecified dementia type  Hypercoagulable status due to COVID 19 Mild protein calorie malnutrition   Past Medical History:  Diagnosis Date   Abnormal posture    Per [facility] Electronic Medical Records System    Anemia    Cognitive communication deficit    Per [facility] Records [facility]    Contracture of left knee    Per [facility] Records [facility]    Diabetes mellitus without complication (HCC)    Difficulty walking    Per [facility] Records [facility]    Extended spectrum beta lactamase (ESBL) resistance    Per [facility] Records [facility]    Gastroesophageal reflux disease    Per [facility] Records [facility]    Glaucoma    Gram negative sepsis (HCC)    Per [facility] Records [facility]    Hypercholesteremia    Hypertension    Hypokalemia    Infection, bacterial    Per [facility] Medical Records [facility]    Muscle weakness  (generalized)    Per [facility] Records [facility]    Need for assistance with personal care    Per [facility] Records [facility]    OAB (overactive bladder)    Per [facility] Records [facility]    Ocular hypertension, bilateral    Per [facility] Records [facility]    Osteoarthritis of right knee    Per [facility] Records [facility]    Paraplegia [facility])    Pyelonephritis, acute    Per [facility] Medical Records [facility]    Status post total right knee replacement    Unspecified dementia without behavioral disturbance (HCC)    Per [facility], [facility] Records [facility]    Urgency of urination    Per [facility] Records [facility]    Wheelchair dependence    Per Ria Bush, [facility] Electronic Medical Records System     Past Surgical History:  Procedure Laterality Date   ABDOMINAL HYSTERECTOMY     CATARACT EXTRACTION     COLONOSCOPY N/A 12/10/2013   Procedure: COLONOSCOPY;  Surgeon: West Bali, MD;  Location: [facility];  Service: Endoscopy;  Laterality: N/A;  8:30 AM   TOTAL KNEE ARTHROPLASTY Right 03/30/2016   Procedure: TOTAL KNEE ARTHROPLASTY;  Surgeon: Vickki Hearing, MD;  Location: [facility];  Service: Orthopedics;  Laterality: Right;    Social History   Socioeconomic History   Marital status: Divorced    Spouse name: Not on file   Number of children: Not on file   Years of education: Not on file   Highest education level: Not on file  Occupational History   Occupation: retired   Tobacco Use   Smoking status: Some Days    Packs/day: 0.25    Years: 3.00    Pack years: 0.75    Types: Cigarettes   Smokeless tobacco: Never  Vaping Use   Vaping Use: Never used  Substance and Sexual Activity   Alcohol  use: No   Drug use: No   Sexual activity: Not Currently    Birth control/protection: Post-menopausal  Other Topics Concern   Not on file  Social History Narrative   [facility] resident of [facility]    Social Determinants of Health   Financial Resource Strain: Not on file  Food Insecurity: Not on file  Transportation Needs: Not on file  Physical Activity: Not on file  Stress: Not on file  Social Connections: Not on file  Intimate Partner Violence: Not on file   Family History  Problem Relation Age of Onset   Cancer Mother    Arthritis Mother    Hypotension Father    Cancer Sister    Hypotension Sister    Colon cancer Neg Hx       VITAL SIGNS BP 129/81   Pulse 86   Temp (!) 97.1 F (36.2 C)   Ht 5\' 5"  (1.651 m)   Wt 206 lb (93.4 kg)   BMI 34.28 kg/m   Outpatient Encounter Medications as of 03/17/2021  Medication Sig   acetaminophen (TYLENOL) 325 MG tablet Take 650 mg by mouth 2 (two) times daily.   ascorbic acid (VITAMIN C) 500 MG tablet Take 500 mg by mouth daily.   aspirin EC 81 MG tablet Take 81 mg by mouth daily.   calcium citrate (CALCITRATE - DOSED IN MG ELEMENTAL CALCIUM) 950 MG tablet Take 200 mg of elemental calcium by mouth daily.   diclofenac Sodium (VOLTAREN) 1 % GEL Apply 4 g topically 3 (three) times daily as needed. Special Instructions: apply to right knee   diclofenac Sodium (VOLTAREN) 1 % GEL Apply 2 g topically 2 (two) times daily as needed. Special Instructions: apply to left index finger for arthritis pain   enoxaparin (LOVENOX) 100 MG/ML injection Inject 100 mg into the skin every 12 (twelve) hours.   iron polysaccharides (NIFEREX) 150 MG capsule Take 1 capsule by mouth daily.   latanoprost (XALATAN) 0.005 % ophthalmic solution Place 1 drop into both eyes at bedtime. wait 5 minutes between multiple eye drops   NON FORMULARY Diet: NAS,   omeprazole (PRILOSEC) 20 MG capsule Take 20 mg by mouth daily. Special Instructions: TAKE 1 CAPSULE BY MOUTH ONCE  DAILY *TAKE ON AN EMPTY STOMACH* FOR GERD *DO NOT CRUSH* (FORMULARY SUB FOR PANTOPRAZOLE 40MG ) Once A Day   oxybutynin (DITROPAN-XL) 10 MG 24 hr tablet Take 10 mg by mouth daily.   polyethylene glycol powder (MIRALAX) 17 GM/SCOOP powder Take by mouth once.   No facility-administered encounter medications on file as of 03/17/2021.     SIGNIFICANT DIAGNOSTIC EXAMS   PREVIOUS  02-05-20: DEXA: t score: -2.279  10-20-20: KUB:  There is a nonspecific bowel gas pattern without obstruction. Moderate to marked increase feces in the colon. No renal stone is seen.  01-10-21: kub: significant  fecal material identified.   01-17-21: chest x-ray: no evidence of acute cardiopulmonary disease; communicable  disease or tuberculosis   02-18-21: ct angio of chest:  1. Negative for acute PE or thoracic aortic dissection. 2.  Aortic Atherosclerosis   02-24-21: bilateral lower extremity venous doppler:  No evidence of deep venous thrombosis in either lower extremity.   02-24-21: KUB Moderate colonic stool burden. No evidence of bowel obstruction.  NO NEW EXAMS.    LABS REVIEWED PREVIOUS;   03-11-20: k+ 3.9 04-08-20: wbc 6.4; hgb 9.6; hct 32.0; mcv 82.3 plt 372; glucose 91; bun 19; creat 0.56; k+ 3.6; na++ 138; ca 8.7 liver normal albumin 2.9  05-18-20: hgb 10.0; hct 32.9  05-30-20: uric acid: 4.8 10-20-20: wbc 8.2; hgb 13.1; hct 42.8; mcv 80.0 plt 458; glucose 143; bun 17; creat 0.53; k+ 4.4; na++ 131; ca 9.1 GFR>60  liver normal albumin 3.9   01-17-21: wbc 4.3; hgb 10.3; hct 33.6; mcv 81.4 plt 385; glucose 105; bun 11; creat 0.50; k+ 3.5; na++ 134; ca 7.9; GFR >60; d-dimer: 5.93 CRP 5.3 01-24-21: d-dimer: 6.73  01-31-21: d-dimer: 5.30 02-07-21: d-dimer: 6.25 02-14-21: d-dimer: 12.74 02-24-21: d-dimer: >20 03-01-21: d-dimer: 14.18 03-10-21: d-dimer: 11.10  TODAY  03-17-21; d-dimer: 10.44  Review of Systems  Constitutional:  Negative for malaise/fatigue.  Respiratory:  Negative for cough and shortness of  breath.   Cardiovascular:  Negative for chest pain, palpitations and leg swelling.  Gastrointestinal:  Negative for abdominal pain, constipation and heartburn.  Musculoskeletal:  Negative for back pain, joint pain and myalgias.  Skin: Negative.   Neurological:  Negative for dizziness.  Psychiatric/Behavioral:  The patient is not nervous/anxious.    Physical Exam Constitutional:      General: She is not in acute distress.    Appearance: She is well-developed. She is obese. She is not diaphoretic.  Neck:     Thyroid: No thyromegaly.  Cardiovascular:     Rate and Rhythm: Normal rate and regular rhythm.     Pulses: Normal pulses.     Heart sounds: Normal heart sounds.  Pulmonary:     Effort: Pulmonary effort is normal. No respiratory distress.     Breath sounds: Normal breath sounds.  Abdominal:     General: Bowel sounds are normal. There is no distension.     Palpations: Abdomen is soft.     Tenderness: There is no abdominal tenderness.  Musculoskeletal:        General: Normal range of motion.     Cervical back: Neck supple.     Right lower leg: No edema.     Left lower leg: No edema.  Lymphadenopathy:     Cervical: No cervical adenopathy.  Skin:    General: Skin is warm and dry.  Neurological:     Mental Status: She is alert. Mental status is at baseline.  Psychiatric:        Mood and Affect: Mood normal.     ASSESSMENT/ PLAN:  TODAY  Aortic atherosclerosis Dementia without behavioral disturbance unspecified dementia type Hypercoagulable status due to COVID 19 Mild protein calorie malnutrition  Will continue current medications Will repeat d-dimer in one week Will continue current plan of care Will continue to monitor her status   Time spent with patient 40 minutes: elevated d-dimer; medications; plan of care.    Synthia Innocent NP [facility]  Contact [phone] Monday through Friday 8am- 5pm  After hours call [phone]

## 2021-03-21 ENCOUNTER — Non-Acute Institutional Stay (SKILLED_NURSING_FACILITY): Payer: Medicare HMO | Admitting: Adult Health

## 2021-03-21 ENCOUNTER — Encounter: Payer: Self-pay | Admitting: Adult Health

## 2021-03-21 DIAGNOSIS — E876 Hypokalemia: Secondary | ICD-10-CM

## 2021-03-21 DIAGNOSIS — H40053 Ocular hypertension, bilateral: Secondary | ICD-10-CM | POA: Diagnosis not present

## 2021-03-21 DIAGNOSIS — I700 Atherosclerosis of aorta: Secondary | ICD-10-CM | POA: Diagnosis not present

## 2021-03-21 NOTE — Progress Notes (Signed)
Location:  [facility] Room Number: 105-D Place of Service:  SNF (31)   CODE STATUS: Full code  No Known Allergies  Chief Complaint  Patient presents with   Medical Management of Chronic Issues        Aortic atherosclerosis . Hypokalemia: . Increased intraocular pressure bilateral    HPI:  Anna Hanna is a 86 year old long term resident of this facility being seen for the management of her chronic illnesses: Aortic atherosclerosis . Hypokalemia: . Increased intraocular pressure bilateral.  There are no reports of uncontrolled pain; no reports of changes in appetite; her weight is stable. Anna Hanna has been treated for covid: now is hypercoagulable on lovenox   Past Medical History:  Diagnosis Date   Abnormal posture    Per [facility], [facility] Records [facility]    Anemia    Cognitive communication deficit    Per [facility], [facility]    Contracture of left knee    Per [facility], [facility]    Diabetes mellitus without complication (HCC)    Difficulty walking    Per [facility], [facility]    Extended spectrum beta lactamase (ESBL) resistance    Per [facility], [facility]    Gastroesophageal reflux disease    Per [facility], [facility]    Glaucoma    Gram negative sepsis (HCC)    Per [facility], [facility]    Hypercholesteremia    Hypertension    Hypokalemia    Infection, bacterial    Per [facility], [facility] Records [facility]    Muscle weakness (generalized)    Per [facility], [facility]    Need for assistance with personal care    Per [facility], [facility]    OAB (overactive bladder)     Per [facility], [facility]    Ocular hypertension, bilateral    Per [facility], [facility]    Osteoarthritis of right knee    Per [facility], [facility]    Paraplegia [facility])    Pyelonephritis, acute    Per [facility], [facility] Records [facility]    Status post total right knee replacement    Unspecified dementia without behavioral disturbance (HCC)    Per [facility], [facility]    Urgency of urination    Per [facility], [facility]    Wheelchair dependence    Per Ria Bush, [facility] Records [facility]     Past Surgical History:  Procedure Laterality Date   ABDOMINAL HYSTERECTOMY     CATARACT EXTRACTION     COLONOSCOPY N/A 12/10/2013   Procedure: COLONOSCOPY;  Surgeon: West Bali, MD;  Location: [facility];  Service: Endoscopy;  Laterality: N/A;  8:30 AM   TOTAL KNEE ARTHROPLASTY Right 03/30/2016   Procedure: TOTAL KNEE ARTHROPLASTY;  Surgeon: Vickki Hearing, MD;  Location: [facility] ORS;  Service: Orthopedics;  Laterality: Right;    Social History   Socioeconomic History   Marital status: Divorced    Spouse name: Not on file   Number of children: Not on file   Years of education: Not  on file   Highest education level: Not on file  Occupational History   Occupation: retired   Tobacco Use   Smoking status: Some Days    Packs/day: 0.25    Years: 3.00    Pack years: 0.75    Types: Cigarettes   Smokeless tobacco: Never  Vaping Use   Vaping Use: Never used  Substance and Sexual Activity   Alcohol use: No   Drug use: No   Sexual activity: Not Currently    Birth control/protection: Post-menopausal  Other Topics Concern   Not on file  Social History Narrative   [facility]    Social Determinants of Health    Financial Resource Strain: Not on file  Food Insecurity: Not on file  Transportation Needs: Not on file  Physical Activity: Not on file  Stress: Not on file  Social Connections: Not on file  Intimate Partner Violence: Not on file   Family History  Problem Relation Age of Onset   Cancer Mother    Arthritis Mother    Hypotension Father    Cancer Sister    Hypotension Sister    Colon cancer Neg Hx       VITAL SIGNS BP 124/77   Pulse (!) 104   Temp 98.1 F (36.7 C)   Resp 16   Ht 5\' 5"  (1.651 m)   Wt 206 lb (93.4 kg)   SpO2 90%   BMI 34.28 kg/m   Outpatient Encounter Medications as of 03/21/2021  Medication Sig   acetaminophen (TYLENOL) 325 MG tablet Take 650 mg by mouth 2 (two) times daily.   ascorbic acid (VITAMIN C) 500 MG tablet Take 500 mg by mouth daily.   aspirin EC 81 MG tablet Take 81 mg by mouth daily.   calcium citrate (CALCITRATE - DOSED IN MG ELEMENTAL CALCIUM) 950 MG tablet Take 200 mg of elemental calcium by mouth daily.   diclofenac Sodium (VOLTAREN) 1 % GEL Apply 4 g topically 3 (three) times daily as needed. Special Instructions: apply to right knee   diclofenac Sodium (VOLTAREN) 1 % GEL Apply 2 g topically 2 (two) times daily as needed. Special Instructions: apply to left index finger for arthritis pain   enoxaparin (LOVENOX) 100 MG/ML injection Inject 100 mg into the skin every 12 (twelve) hours.   iron polysaccharides (NIFEREX) 150 MG capsule Take 1 capsule by mouth daily.   latanoprost (XALATAN) 0.005 % ophthalmic solution Place 1 drop into both eyes at bedtime. wait 5 minutes between multiple eye drops   NON FORMULARY Diet: NAS,   omeprazole (PRILOSEC) 20 MG capsule Take 20 mg by mouth daily. Special Instructions: TAKE 1 CAPSULE BY MOUTH ONCE DAILY *TAKE ON AN EMPTY STOMACH* FOR GERD *DO NOT CRUSH* (FORMULARY SUB FOR PANTOPRAZOLE 40MG ) Once A Day   oxybutynin (DITROPAN-XL) 10 MG 24 hr tablet Take 10 mg by mouth daily.   polyethylene glycol powder  (GLYCOLAX/MIRALAX) 17 GM/SCOOP powder Take by mouth once.   No facility-administered encounter medications on file as of 03/21/2021.     SIGNIFICANT DIAGNOSTIC EXAMS  PREVIOUS  02-05-20: DEXA: t score: -2.279  10-20-20: KUB:  There is a nonspecific bowel gas pattern without obstruction. Moderate to marked increase feces in the colon. No renal stone is seen.  01-10-21: kub: significant fecal material identified.   01-17-21: chest x-ray: no evidence of acute cardiopulmonary disease; communicable  disease or tuberculosis   02-18-21: ct angio of chest:  1. Negative for acute PE or thoracic  aortic dissection. 2.  Aortic Atherosclerosis   02-24-21: bilateral lower extremity venous doppler:  No evidence of deep venous thrombosis in either lower extremity.   02-24-21: KUB Moderate colonic stool burden. No evidence of bowel obstruction.  NO NEW EXAMS.    LABS REVIEWED PREVIOUS;   03-11-20: k+ 3.9 04-08-20: wbc 6.4; hgb 9.6; hct 32.0; mcv 82.3 plt 372; glucose 91; bun 19; creat 0.56; k+ 3.6; na++ 138; ca 8.7 liver normal albumin 2.9  05-18-20: hgb 10.0; hct 32.9  05-30-20: uric acid: 4.8 10-20-20: wbc 8.2; hgb 13.1; hct 42.8; mcv 80.0 plt 458; glucose 143; bun 17; creat 0.53; k+ 4.4; na++ 131; ca 9.1 GFR>60  liver normal albumin 3.9   01-17-21: wbc 4.3; hgb 10.3; hct 33.6; mcv 81.4 plt 385; glucose 105; bun 11; creat 0.50; k+ 3.5; na++ 134; ca 7.9; GFR >60; d-dimer: 5.93 CRP 5.3 01-24-21: d-dimer: 6.73  01-31-21: d-dimer: 5.30 02-07-21: d-dimer: 6.25 02-14-21: d-dimer: 12.74 02-24-21: d-dimer: >20 03-01-21: d-dimer: 14.18 03-10-21: d-dimer: 11.10  TODAY  03-17-21; d-dimer: 10.44  Review of Systems  Constitutional:  Negative for malaise/fatigue.  Respiratory:  Negative for cough and shortness of breath.   Cardiovascular:  Negative for chest pain, palpitations and leg swelling.  Gastrointestinal:  Negative for abdominal pain, constipation and heartburn.  Musculoskeletal:  Negative for back pain,  joint pain and myalgias.  Skin: Negative.   Neurological:  Negative for dizziness.  Psychiatric/Behavioral:  The patient is not nervous/anxious.    Physical Exam Constitutional:      General: Anna Hanna is not in acute distress.    Appearance: Anna Hanna is well-developed. Anna Hanna is obese. Anna Hanna is not diaphoretic.  Neck:     Thyroid: No thyromegaly.  Cardiovascular:     Rate and Rhythm: Normal rate and regular rhythm.     Pulses: Normal pulses.     Heart sounds: Normal heart sounds.  Pulmonary:     Effort: Pulmonary effort is normal. No respiratory distress.     Breath sounds: Normal breath sounds.  Abdominal:     General: Bowel sounds are normal. There is no distension.     Palpations: Abdomen is soft.     Tenderness: There is no abdominal tenderness.  Musculoskeletal:     Cervical back: Neck supple.     Right lower leg: No edema.     Left lower leg: No edema.     Comments: Is able to move all extremities History of right knee replacement with contracture   Lymphadenopathy:     Cervical: No cervical adenopathy.  Skin:    General: Skin is warm and dry.  Neurological:     Mental Status: Anna Hanna is alert. Mental status is at baseline.  Psychiatric:        Mood and Affect: Mood normal.      ASSESSMENT/ PLAN:  TODAY;   Aortic atherosclerosis (ct 09-03-19) will monitor   2. Hypokalemia: is stable  4.4 will monitor  3. Increased intraocular pressure bilateral is stable will continue xalatan to both eyes.    PREVIOUS   4. Primary osteoarthritis right knee; left index finger: is stable will continue tylenol 650 mg three times daily voltaren gel 4 gm to right knee three times daily left index finger 2 gm twice daily   5. Essential hypertension: is stable b/p 124/77 will continue asa 81 mg daily  6. Chronic anemia: is stable hgb 10.3; will continue ferrex daily   7. Hypercoagulable state due to COVID 19: d-dimer is 10. 44 will continue  lovenox twice daily   8. GERD without esophagitis: is  stable will continue protonix 40 mg daily   9. Urinary urgency: is stable will continue ditropan xl 10 mg daily   10. Vascular dementia without behavioral disturbance: is stable weight is 206 pounds; will monitor    Synthia Innocent NP [facility]  Contact [phone] Monday through Friday 8am- 5pm  After hours call [phone]

## 2021-03-22 ENCOUNTER — Encounter: Payer: Self-pay | Admitting: Adult Health

## 2021-03-22 ENCOUNTER — Non-Acute Institutional Stay (SKILLED_NURSING_FACILITY): Payer: Medicare HMO | Admitting: Adult Health

## 2021-03-22 ENCOUNTER — Other Ambulatory Visit (HOSPITAL_COMMUNITY)
Admission: RE | Admit: 2021-03-22 | Discharge: 2021-03-22 | Disposition: A | Discharge status: Home / Self Care | Payer: Medicare HMO | Source: Skilled Nursing Facility | Attending: Adult Health | Admitting: Adult Health

## 2021-03-22 DIAGNOSIS — D6869 Other thrombophilia: Secondary | ICD-10-CM | POA: Diagnosis not present

## 2021-03-22 DIAGNOSIS — U071 COVID-19: Secondary | ICD-10-CM

## 2021-03-22 LAB — CBC
HCT: 33.7 % — ABNORMAL LOW (ref 36.0–46.0)
Hemoglobin: 10.1 g/dL — ABNORMAL LOW (ref 12.0–15.0)
MCH: 24.6 pg — ABNORMAL LOW (ref 26.0–34.0)
MCHC: 30 g/dL (ref 30.0–36.0)
MCV: 82.2 fL (ref 80.0–100.0)
Platelets: 513 10*3/uL — ABNORMAL HIGH (ref 150–400)
RBC: 4.1 MIL/uL (ref 3.87–5.11)
RDW: 15.8 % — ABNORMAL HIGH (ref 11.5–15.5)
WBC: 5.8 10*3/uL (ref 4.0–10.5)
nRBC: 0 % (ref 0.0–0.2)

## 2021-03-22 LAB — COMPREHENSIVE METABOLIC PANEL
ALT: 11 U/L (ref 0–44)
AST: 11 U/L — ABNORMAL LOW (ref 15–41)
Albumin: 3.1 g/dL — ABNORMAL LOW (ref 3.5–5.0)
Alkaline Phosphatase: 61 U/L (ref 38–126)
Anion gap: 7 (ref 5–15)
BUN: 13 mg/dL (ref 8–23)
CO2: 26 mmol/L (ref 22–32)
Calcium: 8.8 mg/dL — ABNORMAL LOW (ref 8.9–10.3)
Chloride: 102 mmol/L (ref 98–111)
Creatinine, Ser: 0.54 mg/dL (ref 0.44–1.00)
GFR, Estimated: >60 mL/min (ref >60)
Glucose, Bld: 98 mg/dL (ref 70–99)
Potassium: 3.6 mmol/L (ref 3.5–5.1)
Sodium: 135 mmol/L (ref 135–145)
Total Bilirubin: 0.2 mg/dL — ABNORMAL LOW (ref 0.3–1.2)
Total Protein: 6.9 g/dL (ref 6.5–8.1)

## 2021-03-22 LAB — D-DIMER, QUANTITATIVE: D-Dimer, Quant: 10.32 ug/mL-FEU — ABNORMAL HIGH (ref 0.00–0.50)

## 2021-03-22 NOTE — Progress Notes (Signed)
Location:  [facility] Room Number: 105-D Place of Service:  SNF (31)   CODE STATUS: FULL CODE  No Known Allergies  Chief Complaint  Patient presents with   Follow-up    Lab Follow Up    HPI:  She continues to be on therapeutic lovenox for elevated d-dimer. Her level today is 10.32. she is slowly improving. She is tolerating lovenox without difficulty. There are no reports of bleeding present.   Past Medical History:  Diagnosis Date   Abnormal posture    Per [facility], [facility] Records [facility]    Anemia    Cognitive communication deficit    Per [facility], [facility]    Contracture of left knee    Per [facility], [facility] Records [facility]    Diabetes mellitus without complication (HCC)    Difficulty walking    Per [employer], [facility] Records [facility]    Extended spectrum beta lactamase (ESBL) resistance    Per [facility], [facility] Records [facility]    Gastroesophageal reflux disease    Per [facility], [facility] Records [facility]    Glaucoma    Gram negative sepsis (HCC)    Per [facility], [facility] Records [facility]    Hypercholesteremia    Hypertension    Hypokalemia    Infection, bacterial    Per [employer], [facility] Records [facility]    Muscle weakness (generalized)    Per [facility], [facility] Records [facility]    Need for assistance with personal care    Per [facility], [facility] Records [facility]    OAB (overactive bladder)    Per [facility], [facility] Records [facility]    Ocular hypertension, bilateral    Per [facility], [facility] Records [facility]    Osteoarthritis of right knee    Per [facility], [facility]  Electronic [facility] Records [facility]    Paraplegia [facility])    Pyelonephritis, acute    Per [facility], [facility] Records [facility]    Status post total right knee replacement    Unspecified dementia without behavioral disturbance (HCC)    Per [facility], [facility] Records [facility]    Urgency of urination    Per [facility], [facility] Records [facility]    Wheelchair dependence    Per Ria Bush, [facility] Records [facility]     Past Surgical History:  Procedure Laterality Date   ABDOMINAL HYSTERECTOMY     CATARACT EXTRACTION     COLONOSCOPY N/A 12/10/2013   Procedure: COLONOSCOPY;  Surgeon: West Bali, MD;  Location: [facility];  Service: Endoscopy;  Laterality: N/A;  8:30 AM   TOTAL KNEE ARTHROPLASTY Right 03/30/2016   Procedure: TOTAL KNEE ARTHROPLASTY;  Surgeon: Vickki Hearing, MD;  Location: [facility];  Service: Orthopedics;  Laterality: Right;    Social History   Socioeconomic History   Marital status: Divorced    Spouse name: Not on file   Number of children: Not on file   Years of education: Not on file   Highest education level: Not on file  Occupational History   Occupation: retired   Tobacco Use   Smoking status: Some Days    Packs/day: 0.25    Years: 3.00    Pack  years: 0.75    Types: Cigarettes   Smokeless tobacco: Never  Vaping Use   Vaping Use: Never used  Substance and Sexual Activity   Alcohol use: No   Drug use: No   Sexual activity: Not Currently    Birth control/protection: Post-menopausal  Other Topics Concern   Not on file  Social History Narrative   [facility] resident [facility]    Social Determinants of Health   Financial Resource Strain: Not on file  Food Insecurity: Not on file  Transportation Needs: Not on file  Physical Activity: Not on file  Stress: Not on file  Social Connections: Not on file  Intimate Partner Violence: Not on file    Family History  Problem Relation Age of Onset   Cancer Mother    Arthritis Mother    Hypotension Father    Cancer Sister    Hypotension Sister    Colon cancer Neg Hx       VITAL SIGNS BP 124/77   Pulse (!) 104   Temp 97.8 F (36.6 C)   Resp 16   Ht 5\' 5"  (1.651 m)   Wt 206 lb (93.4 kg)   SpO2 90%   BMI 34.28 kg/m   Outpatient Encounter Medications as of 03/22/2021  Medication Sig   acetaminophen (TYLENOL) 325 MG tablet Take 650 mg by mouth 2 (two) times daily.   ascorbic acid (VITAMIN C) 500 MG tablet Take 500 mg by mouth daily.   aspirin EC 81 MG tablet Take 81 mg by mouth daily.   calcium citrate (CALCITRATE - DOSED IN MG ELEMENTAL CALCIUM) 950 MG tablet Take 200 mg of elemental calcium by mouth daily.   diclofenac Sodium (VOLTAREN) 1 % GEL Apply 4 g topically 3 (three) times daily as needed. Special Instructions: apply to right knee   diclofenac Sodium (VOLTAREN) 1 % GEL Apply 2 g topically 2 (two) times daily as needed. Special Instructions: apply to left index finger for arthritis pain   enoxaparin (LOVENOX) 100 MG/ML injection Inject 100 mg into the skin every 12 (twelve) hours.   iron polysaccharides (NIFEREX) 150 MG capsule Take 1 capsule by mouth daily.   latanoprost (XALATAN) 0.005 % ophthalmic solution Place 1 drop into both eyes at bedtime. wait 5 minutes between multiple eye drops   NON FORMULARY Diet: NAS,   omeprazole (PRILOSEC) 20 MG capsule Take 20 mg by mouth daily. Special Instructions: TAKE 1 CAPSULE BY MOUTH ONCE DAILY *TAKE ON AN EMPTY STOMACH* FOR GERD *DO NOT CRUSH* (FORMULARY SUB FOR PANTOPRAZOLE 40MG ) Once A Day   oxybutynin (DITROPAN-XL) 10 MG 24 hr tablet Take 10 mg by mouth daily.   polyethylene glycol powder (GLYCOLAX/MIRALAX) 17 GM/SCOOP powder Take by mouth once.   No facility-administered encounter medications on file as of 03/22/2021.     SIGNIFICANT DIAGNOSTIC EXAMS  PREVIOUS  02-05-20: DEXA: t score: -2.279  10-20-20: KUB:  There  is a nonspecific bowel gas pattern without obstruction. Moderate to marked increase feces in the colon. No renal stone is seen.  01-10-21: kub: significant fecal material identified.   01-17-21: chest x-ray: no evidence of acute cardiopulmonary disease; communicable  disease or tuberculosis   02-18-21: ct angio of chest:  1. Negative for acute PE or thoracic aortic dissection. 2.  Aortic Atherosclerosis   02-24-21: bilateral lower extremity venous doppler:  No evidence of deep venous thrombosis in either lower extremity.   02-24-21: KUB Moderate colonic stool burden. No evidence of bowel obstruction.  NO NEW  EXAMS.    LABS REVIEWED PREVIOUS;   03-11-20: k+ 3.9 04-08-20: wbc 6.4; hgb 9.6; hct 32.0; mcv 82.3 plt 372; glucose 91; bun 19; creat 0.56; k+ 3.6; na++ 138; ca 8.7 liver normal albumin 2.9  05-18-20: hgb 10.0; hct 32.9  05-30-20: uric acid: 4.8 10-20-20: wbc 8.2; hgb 13.1; hct 42.8; mcv 80.0 plt 458; glucose 143; bun 17; creat 0.53; k+ 4.4; na++ 131; ca 9.1 GFR>60  liver normal albumin 3.9   01-17-21: wbc 4.3; hgb 10.3; hct 33.6; mcv 81.4 plt 385; glucose 105; bun 11; creat 0.50; k+ 3.5; na++ 134; ca 7.9; GFR >60; d-dimer: 5.93 CRP 5.3 01-24-21: d-dimer: 6.73  01-31-21: d-dimer: 5.30 02-07-21: d-dimer: 6.25 02-14-21: d-dimer: 12.74 02-24-21: d-dimer: >20 03-01-21: d-dimer: 14.18 03-10-21: d-dimer: 11.10 03-17-21; d-dimer: 10.44  TODAY  03-22-21: d-dimer: 10.32   Review of Systems  Constitutional:  Negative for malaise/fatigue.  Respiratory:  Negative for cough and shortness of breath.   Cardiovascular:  Negative for chest pain, palpitations and leg swelling.  Gastrointestinal:  Negative for abdominal pain, constipation and heartburn.  Musculoskeletal:  Negative for back pain, joint pain and myalgias.  Skin: Negative.   Neurological:  Negative for dizziness.  Psychiatric/Behavioral:  The patient is not nervous/anxious.    Physical Exam Constitutional:      General: She is not in acute  distress.    Appearance: She is well-developed. She is obese. She is not diaphoretic.  Neck:     Thyroid: No thyromegaly.  Cardiovascular:     Rate and Rhythm: Normal rate and regular rhythm.     Pulses: Normal pulses.     Heart sounds: Normal heart sounds.  Pulmonary:     Effort: Pulmonary effort is normal. No respiratory distress.     Breath sounds: Normal breath sounds.  Abdominal:     General: Bowel sounds are normal. There is no distension.     Palpations: Abdomen is soft.     Tenderness: There is no abdominal tenderness.  Musculoskeletal:     Cervical back: Neck supple.     Right lower leg: No edema.     Left lower leg: No edema.     Comments:  Is able to move all extremities History of right knee replacement with contracture    Lymphadenopathy:     Cervical: No cervical adenopathy.  Skin:    General: Skin is warm and dry.  Neurological:     Mental Status: She is alert. Mental status is at baseline.  Psychiatric:        Mood and Affect: Mood normal.      ASSESSMENT/ PLAN:  TODAY   Hypercoagulable state associated with COVID 19:   Is without change:  Will continue lovenox 100 mg twice daily  Will repeat d-dimer: 03-29-21.    Synthia Innocent NP [facility]  Contact [phone] Monday through Friday 8am- 5pm  After hours call [phone]

## 2021-03-23 LAB — MICROALBUMIN, URINE: Microalb, Ur: 6.4 ug/mL — ABNORMAL HIGH

## 2021-03-29 ENCOUNTER — Encounter: Payer: Self-pay | Admitting: Adult Health

## 2021-03-29 ENCOUNTER — Non-Acute Institutional Stay (SKILLED_NURSING_FACILITY): Payer: Medicare HMO | Admitting: Adult Health

## 2021-03-29 ENCOUNTER — Other Ambulatory Visit (HOSPITAL_COMMUNITY)
Admission: RE | Admit: 2021-03-29 | Discharge: 2021-03-29 | Disposition: A | Discharge status: Home / Self Care | Payer: Medicare HMO | Source: Skilled Nursing Facility | Attending: Adult Health | Admitting: Adult Health

## 2021-03-29 DIAGNOSIS — D6869 Other thrombophilia: Secondary | ICD-10-CM

## 2021-03-29 DIAGNOSIS — U071 COVID-19: Secondary | ICD-10-CM | POA: Insufficient documentation

## 2021-03-29 LAB — D-DIMER, QUANTITATIVE: D-Dimer, Quant: 10.97 ug/mL-FEU — ABNORMAL HIGH (ref 0.00–0.50)

## 2021-03-29 NOTE — Progress Notes (Signed)
Location:  [facility] Room Number: 105-D Place of Service:  SNF (31)   CODE STATUS: Full Code   No Known Allergies  Chief Complaint  Patient presents with   Acute Visit    Follow-up on labs     HPI:  She remains on therapeutic lovenox for her elevated d-dimer related to COVID. Her d-dimer today is 10.97; is without significant change. She has been tested for dvt's and pe. She denies any uncontrolled pain. No chest pain or shortness of breath. She is not a candidate for hematology.   Past Medical History:  Diagnosis Date   Abnormal posture    Per [employer], [facility] Records [facility]    Anemia    Cognitive communication deficit    Per [facility], [facility]    Contracture of left knee    Per [facility], [facility]    Diabetes mellitus without complication (HCC)    Difficulty walking    Per [facility], [facility]    Extended spectrum beta lactamase (ESBL) resistance    Per [facility], [facility]    Gastroesophageal reflux disease    Per [facility], [facility]    Glaucoma    Gram negative sepsis (HCC)    Per [facility], [facility]    Hypercholesteremia    Hypertension    Hypokalemia    Infection, bacterial    Per [employer], [facility] Records [facility]    Muscle weakness (generalized)    Per [facility], [facility]    Need for assistance with personal care    Per [facility], [facility]    OAB (overactive bladder)    Per [facility], [facility]    Ocular hypertension, bilateral    Per [facility], [facility] Electronic [facility]  Records [facility]    Osteoarthritis of right knee    Per [facility], [facility]    Paraplegia [facility]    Pyelonephritis, acute    Per [employer], [facility] Records [facility]    Status post total right knee replacement    Unspecified dementia without behavioral disturbance (HCC)    Per [facility], [facility] Records [facility]    Urgency of urination    Per [employer], [facility]    Wheelchair dependence    Per Ria Bush, [facility] Records [facility]     Past Surgical History:  Procedure Laterality Date   ABDOMINAL HYSTERECTOMY     CATARACT EXTRACTION     COLONOSCOPY N/A 12/10/2013   Procedure: COLONOSCOPY;  Surgeon: West Bali, MD;  Location: [facility];  Service: Endoscopy;  Laterality: N/A;  8:30 AM   TOTAL KNEE ARTHROPLASTY Right 03/30/2016   Procedure: TOTAL KNEE ARTHROPLASTY;  Surgeon: Vickki Hearing, MD;  Location: [facility] ORS;  Service: Orthopedics;  Laterality: Right;    Social History   Socioeconomic History   Marital status: Divorced    Spouse name: Not on file   Number of children: Not on file   Years of education: Not on file   Highest education level: Not on file  Occupational History   Occupation: retired   Tobacco Use  Smoking status: Some Days    Packs/day: 0.25    Years: 3.00    Pack years: 0.75    Types: Cigarettes   Smokeless tobacco: Never  Vaping Use   Vaping Use: Never used  Substance and Sexual Activity   Alcohol use: No   Drug use: No   Sexual activity: Not Currently    Birth control/protection: Post-menopausal  Other Topics Concern   Not on file  Social History Narrative   Long term resident of [facility]    Social Determinants of Health   Financial Resource Strain: Not on file  Food Insecurity: Not on file  Transportation Needs: Not on file  Physical Activity: Not on file  Stress: Not on  file  Social Connections: Not on file  Intimate Partner Violence: Not on file   Family History  Problem Relation Age of Onset   Cancer Mother    Arthritis Mother    Hypotension Father    Cancer Sister    Hypotension Sister    Colon cancer Neg Hx       VITAL SIGNS BP 112/69   Pulse 78   Temp 98.4 F (36.9 C)   Resp 16   Ht 5\' 5"  (1.651 m)   Wt 198 lb (89.8 kg)   SpO2 90%   BMI 32.95 kg/m   Outpatient Encounter Medications as of 03/29/2021  Medication Sig   acetaminophen (TYLENOL) 325 MG tablet Take 650 mg by mouth 2 (two) times daily.   ascorbic acid (VITAMIN C) 500 MG tablet Take 500 mg by mouth daily.   aspirin EC 81 MG tablet Take 81 mg by mouth daily.   calcium citrate (CALCITRATE - DOSED IN MG ELEMENTAL CALCIUM) 950 MG tablet Take 200 mg of elemental calcium by mouth daily.   diclofenac Sodium (VOLTAREN) 1 % GEL Apply 4 g topically 3 (three) times daily as needed. Special Instructions: apply to right knee   diclofenac Sodium (VOLTAREN) 1 % GEL Apply 2 g topically 2 (two) times daily as needed. Special Instructions: apply to left index finger for arthritis pain   enoxaparin (LOVENOX) 100 MG/ML injection Inject 100 mg into the skin every 12 (twelve) hours.   iron polysaccharides (NIFEREX) 150 MG capsule Take 1 capsule by mouth daily.   latanoprost (XALATAN) 0.005 % ophthalmic solution Place 1 drop into both eyes at bedtime. wait 5 minutes between multiple eye drops   NON FORMULARY Diet: NAS,   omeprazole (PRILOSEC) 20 MG capsule Take 20 mg by mouth daily. Special Instructions: *TAKE ON AN EMPTY STOMACH* FOR GERD *DO NOT CRUSH* (FORMULARY SUB FOR PANTOPRAZOLE 40MG )   oxybutynin (DITROPAN-XL) 10 MG 24 hr tablet Take 10 mg by mouth daily.   polyethylene glycol powder (GLYCOLAX/MIRALAX) 17 GM/SCOOP powder Take by mouth once.   No facility-administered encounter medications on file as of 03/29/2021.     SIGNIFICANT DIAGNOSTIC EXAMS  PREVIOUS  02-05-20: DEXA: t score:  -2.279  10-20-20: KUB:  There is a nonspecific bowel gas pattern without obstruction. Moderate to marked increase feces in the colon. No renal stone is seen.  01-10-21: kub: significant fecal material identified.   01-17-21: chest x-ray: no evidence of acute cardiopulmonary disease; communicable  disease or tuberculosis   02-18-21: ct angio of chest:  1. Negative for acute PE or thoracic aortic dissection. 2.  Aortic Atherosclerosis   02-24-21: bilateral lower extremity venous doppler:  No evidence of deep venous thrombosis in either lower extremity.   02-24-21: KUB Moderate colonic stool burden. No  evidence of bowel obstruction.  NO NEW EXAMS.    LABS REVIEWED PREVIOUS;   04-08-20: wbc 6.4; hgb 9.6; hct 32.0; mcv 82.3 plt 372; glucose 91; bun 19; creat 0.56; k+ 3.6; na++ 138; ca 8.7 liver normal albumin 2.9  05-18-20: hgb 10.0; hct 32.9  05-30-20: uric acid: 4.8 10-20-20: wbc 8.2; hgb 13.1; hct 42.8; mcv 80.0 plt 458; glucose 143; bun 17; creat 0.53; k+ 4.4; na++ 131; ca 9.1 GFR>60  liver normal albumin 3.9   01-17-21: wbc 4.3; hgb 10.3; hct 33.6; mcv 81.4 plt 385; glucose 105; bun 11; creat 0.50; k+ 3.5; na++ 134; ca 7.9; GFR >60; d-dimer: 5.93 CRP 5.3 01-24-21: d-dimer: 6.73  01-31-21: d-dimer: 5.30 02-07-21: d-dimer: 6.25 02-14-21: d-dimer: 12.74 02-24-21: d-dimer: >20 03-01-21: d-dimer: 14.18 03-10-21: d-dimer: 11.10 03-17-21; d-dimer: 10.44 03-22-21: d-dimer: 10.32   TODAY  03-29-21: d-dimer: 10.97  Review of Systems  Constitutional:  Negative for malaise/fatigue.  Respiratory:  Negative for cough and shortness of breath.   Cardiovascular:  Negative for chest pain, palpitations and leg swelling.  Gastrointestinal:  Negative for abdominal pain, constipation and heartburn.  Musculoskeletal:  Negative for back pain, joint pain and myalgias.  Skin: Negative.   Neurological:  Negative for dizziness.  Psychiatric/Behavioral:  The patient is not nervous/anxious.     Physical  Exam Constitutional:      General: She is not in acute distress.    Appearance: She is well-developed. She is obese. She is not diaphoretic.  Neck:     Thyroid: No thyromegaly.  Cardiovascular:     Rate and Rhythm: Normal rate and regular rhythm.     Heart sounds: Normal heart sounds.  Pulmonary:     Effort: Pulmonary effort is normal. No respiratory distress.     Breath sounds: Normal breath sounds.  Abdominal:     General: Bowel sounds are normal. There is no distension.     Palpations: Abdomen is soft.     Tenderness: There is no abdominal tenderness.  Musculoskeletal:     Cervical back: Neck supple.     Right lower leg: No edema.     Left lower leg: No edema.     Comments:  Is able to move all extremities History of right knee replacement with contracture     Lymphadenopathy:     Cervical: No cervical adenopathy.  Skin:    General: Skin is warm and dry.  Neurological:     Mental Status: She is alert. Mental status is at baseline.  Psychiatric:        Mood and Affect: Mood normal.    ASSESSMENT/ PLAN:  TODAY  Hypercoagulable state associated with COVID 19: is worse: will continue lovenox and will repeat d-dimer on 04-11-21.    Synthia Innocent NP [facility]  Contact [phone] Monday through Friday 8am- 5pm  After hours call [phone]

## 2021-04-11 ENCOUNTER — Encounter: Payer: Self-pay | Admitting: Adult Health

## 2021-04-11 ENCOUNTER — Non-Acute Institutional Stay (SKILLED_NURSING_FACILITY): Payer: Medicare HMO | Admitting: Adult Health

## 2021-04-11 ENCOUNTER — Other Ambulatory Visit (HOSPITAL_COMMUNITY)
Admission: RE | Admit: 2021-04-11 | Discharge: 2021-04-11 | Disposition: A | Discharge status: Home / Self Care | Payer: Medicare HMO | Source: Skilled Nursing Facility | Attending: Adult Health | Admitting: Adult Health

## 2021-04-11 DIAGNOSIS — U071 COVID-19: Secondary | ICD-10-CM | POA: Diagnosis present

## 2021-04-11 DIAGNOSIS — D6869 Other thrombophilia: Secondary | ICD-10-CM

## 2021-04-11 LAB — D-DIMER, QUANTITATIVE: D-Dimer, Quant: 12.12 ug/mL-FEU — ABNORMAL HIGH (ref 0.00–0.50)

## 2021-04-11 NOTE — Progress Notes (Signed)
Location:  [facility] Room Number: 105-D Place of Service:  SNF (31) Provider: Sharee Holster, NP  CODE STATUS: FULL CODE  No Known Allergies  Chief Complaint  Patient presents with   Acute Visit    Follow up lab work.     HPI:  Her d-dimer today is at 12.12.  she remains on therapeutic lovenox injections. Her d-dimer increased while on eliquis. There are no indications of abnormal clotting present. She denies any chest pain or shortness of breath.    Past Medical History:  Diagnosis Date   Abnormal posture    Per [employer], [facility] Records [facility]    Anemia    Cognitive communication deficit    Per [employer], [facility]    Contracture of left knee    Per [employer], [facility]    Diabetes mellitus without complication (HCC)    Difficulty walking    Per [employer], [facility]    Extended spectrum beta lactamase (ESBL) resistance    Per [facility], [facility]    Gastroesophageal reflux disease    Per [facility], [facility]    Glaucoma    Gram negative sepsis (HCC)    Per [facility], [facility]    Hypercholesteremia    Hypertension    Hypokalemia    Infection, bacterial    Per [employer], [facility] Records [facility]    Muscle weakness (generalized)    Per [facility], [facility]    Need for assistance with personal care    Per [facility], [facility]    OAB (overactive bladder)    Per [facility], [facility]    Ocular hypertension, bilateral    Per [facility], [facility]    Osteoarthritis  of right knee    Per [facility], [facility] Records [facility]    Paraplegia [facility])    Pyelonephritis, acute    Per [employer], [facility] Records [facility]    Status post total right knee replacement    Unspecified dementia without behavioral disturbance (HCC)    Per [employer], [facility]    Urgency of urination    Per [employer], [facility]    Wheelchair dependence    Per Ria Bush, [facility] Records [facility]     Past Surgical History:  Procedure Laterality Date   ABDOMINAL HYSTERECTOMY     CATARACT EXTRACTION     COLONOSCOPY N/A 12/10/2013   Procedure: COLONOSCOPY;  Surgeon: West Bali, MD;  Location: [facility];  Service: Endoscopy;  Laterality: N/A;  8:30 AM   TOTAL KNEE ARTHROPLASTY Right 03/30/2016   Procedure: TOTAL KNEE ARTHROPLASTY;  Surgeon: Vickki Hearing, MD;  Location: [facility];  Service: Orthopedics;  Laterality: Right;    Social History   Socioeconomic History   Marital status: Divorced    Spouse name: Not on file   Number of children: Not on file   Years of education: Not on file   Highest education level: Not on file  Occupational History   Occupation: retired   Tobacco Use   Smoking status: Some Days  Packs/day: 0.25    Years: 3.00    Pack years: 0.75    Types: Cigarettes   Smokeless tobacco: Never  Vaping Use   Vaping Use: Never used  Substance and Sexual Activity   Alcohol use: No   Drug use: No   Sexual activity: Not Currently    Birth control/protection: Post-menopausal  Other Topics Concern   Not on file  Social History Narrative   Long term resident of [facility]    Social Determinants of Health   Financial Resource Strain: Not on file  Food Insecurity: Not on file  Transportation Needs: Not on file  Physical Activity: Not on file  Stress: Not on file  Social Connections: Not on  file  Intimate Partner Violence: Not on file   Family History  Problem Relation Age of Onset   Cancer Mother    Arthritis Mother    Hypotension Father    Cancer Sister    Hypotension Sister    Colon cancer Neg Hx       VITAL SIGNS BP 101/69   Pulse 96   Temp (!) 97.3 F (36.3 C)   Resp 16   Ht 5\' 5"  (1.651 m)   Wt 198 lb (89.8 kg)   SpO2 90%   BMI 32.95 kg/m   Outpatient Encounter Medications as of 04/11/2021  Medication Sig   acetaminophen (TYLENOL) 325 MG tablet Take 650 mg by mouth 2 (two) times daily.   ascorbic acid (VITAMIN C) 500 MG tablet Take 500 mg by mouth daily.   aspirin EC 81 MG tablet Take 81 mg by mouth daily.   calcium citrate (CALCITRATE - DOSED IN MG ELEMENTAL CALCIUM) 950 MG tablet Take 200 mg of elemental calcium by mouth daily.   diclofenac Sodium (VOLTAREN) 1 % GEL Apply 4 g topically 3 (three) times daily as needed. Special Instructions: apply to right knee   diclofenac Sodium (VOLTAREN) 1 % GEL Apply 2 g topically 2 (two) times daily as needed. Special Instructions: apply to left index finger for arthritis pain   enoxaparin (LOVENOX) 100 MG/ML injection Inject 100 mg into the skin every 12 (twelve) hours.   iron polysaccharides (NIFEREX) 150 MG capsule Take 1 capsule by mouth daily.   latanoprost (XALATAN) 0.005 % ophthalmic solution Place 1 drop into both eyes at bedtime. wait 5 minutes between multiple eye drops   NON FORMULARY Diet: NAS,   omeprazole (PRILOSEC) 20 MG capsule Take 20 mg by mouth daily. Special Instructions: *TAKE ON AN EMPTY STOMACH* FOR GERD *DO NOT CRUSH* (FORMULARY SUB FOR PANTOPRAZOLE 40MG )   oxybutynin (DITROPAN-XL) 10 MG 24 hr tablet Take 10 mg by mouth daily.   polyethylene glycol powder (GLYCOLAX/MIRALAX) 17 GM/SCOOP powder Take by mouth once.   No facility-administered encounter medications on file as of 04/11/2021.     SIGNIFICANT DIAGNOSTIC EXAMS   PREVIOUS  02-05-20: DEXA: t score: -2.279  10-20-20: KUB:  There  is a nonspecific bowel gas pattern without obstruction. Moderate to marked increase feces in the colon. No renal stone is seen.  01-10-21: kub: significant fecal material identified.   01-17-21: chest x-ray: no evidence of acute cardiopulmonary disease; communicable  disease or tuberculosis   02-18-21: ct angio of chest:  1. Negative for acute PE or thoracic aortic dissection. 2.  Aortic Atherosclerosis   02-24-21: bilateral lower extremity venous doppler:  No evidence of deep venous thrombosis in either lower extremity.   02-24-21: KUB Moderate colonic stool burden. No evidence of bowel obstruction.  NO NEW EXAMS.    LABS REVIEWED PREVIOUS;   04-08-20: wbc 6.4; hgb 9.6; hct 32.0; mcv 82.3 plt 372; glucose 91; bun 19; creat 0.56; k+ 3.6; na++ 138; ca 8.7 liver normal albumin 2.9  05-18-20: hgb 10.0; hct 32.9  05-30-20: uric acid: 4.8 10-20-20: wbc 8.2; hgb 13.1; hct 42.8; mcv 80.0 plt 458; glucose 143; bun 17; creat 0.53; k+ 4.4; na++ 131; ca 9.1 GFR>60  liver normal albumin 3.9   01-17-21: wbc 4.3; hgb 10.3; hct 33.6; mcv 81.4 plt 385; glucose 105; bun 11; creat 0.50; k+ 3.5; na++ 134; ca 7.9; GFR >60; d-dimer: 5.93 CRP 5.3 01-24-21: d-dimer: 6.73  01-31-21: d-dimer: 5.30 02-07-21: d-dimer: 6.25 02-14-21: d-dimer: 12.74 02-24-21: d-dimer: >20 03-01-21: d-dimer: 14.18 03-10-21: d-dimer: 11.10 03-17-21; d-dimer: 10.44 03-22-21: d-dimer: 10.32  03-29-21: d-dimer: 10.97  TODAY  04-11-21: d-dimer 12.12  Review of Systems  Constitutional:  Negative for malaise/fatigue.  Respiratory:  Negative for cough and shortness of breath.   Cardiovascular:  Negative for chest pain, palpitations and leg swelling.  Gastrointestinal:  Negative for abdominal pain, constipation and heartburn.  Musculoskeletal:  Negative for back pain, joint pain and myalgias.  Skin: Negative.   Neurological:  Negative for dizziness.  Psychiatric/Behavioral:  The patient is not nervous/anxious.    Physical Exam Constitutional:       General: She is not in acute distress.    Appearance: She is well-developed. She is obese. She is not diaphoretic.  Neck:     Thyroid: No thyromegaly.  Cardiovascular:     Rate and Rhythm: Normal rate and regular rhythm.     Pulses: Normal pulses.     Heart sounds: Normal heart sounds.  Pulmonary:     Effort: Pulmonary effort is normal. No respiratory distress.     Breath sounds: Normal breath sounds.  Abdominal:     General: Bowel sounds are normal. There is no distension.     Palpations: Abdomen is soft.     Tenderness: There is no abdominal tenderness.  Musculoskeletal:        General: Normal range of motion.     Cervical back: Neck supple.     Right lower leg: Edema present.     Left lower leg: Edema present.     Comments:   Is able to move all extremities History of right knee replacement with contracture      Lymphadenopathy:     Cervical: No cervical adenopathy.  Skin:    General: Skin is warm and dry.  Neurological:     Mental Status: She is alert. Mental status is at baseline.  Psychiatric:        Mood and Affect: Mood normal.     ASSESSMENT/ PLAN:  TODAY  Hypercoagulable state due to covid 19: her d-dimer is 12.12 without significant change; will continue lovenox injeciotns and will repeat lab in 2 weeks. Will monitor her status.    Synthia Innocent NP [facility]  Contact [phone] Monday through Friday 8am- 5pm  After hours call [phone]

## 2021-04-21 ENCOUNTER — Encounter: Payer: Self-pay | Admitting: Adult Health

## 2021-04-21 ENCOUNTER — Encounter (HOSPITAL_COMMUNITY): Payer: Self-pay

## 2021-04-21 ENCOUNTER — Non-Acute Institutional Stay (SKILLED_NURSING_FACILITY): Payer: Medicare HMO | Admitting: Adult Health

## 2021-04-21 ENCOUNTER — Ambulatory Visit (HOSPITAL_COMMUNITY)
Admit: 2021-04-21 | Discharge: 2021-04-21 | Disposition: A | Discharge status: Home / Self Care | Payer: Medicare HMO | Attending: Adult Health | Admitting: Adult Health

## 2021-04-21 DIAGNOSIS — I10 Essential (primary) hypertension: Secondary | ICD-10-CM

## 2021-04-21 DIAGNOSIS — D649 Anemia, unspecified: Secondary | ICD-10-CM

## 2021-04-21 DIAGNOSIS — M1711 Unilateral primary osteoarthritis, right knee: Secondary | ICD-10-CM

## 2021-04-21 DIAGNOSIS — M19042 Primary osteoarthritis, left hand: Secondary | ICD-10-CM | POA: Diagnosis not present

## 2021-04-21 NOTE — Progress Notes (Signed)
Location:  [facility] Room Number: 105-D Place of Service:  SNF (31)   CODE STATUS: Full Code  No Known Allergies  Chief Complaint  Patient presents with   Medical Management of Chronic Issues             Primary osteoarthritis right knee; left index finger:    Essential hypertension: . Chronic anemia:     HPI:  She is a 86 year old long term resident of this facility being seen for the management of her chronic illnesses: Primary osteoarthritis right knee; left index finger:    Essential hypertension: . Chronic anemia. There are no reports of uncontrolled pain. There are no reports of agitation or anxiety. She continues to socialize with others.   Past Medical History:  Diagnosis Date   Abnormal posture    Per [facility], [facility]    Anemia    Cognitive communication deficit    Per [facility], [facility]    Contracture of left knee    Per [facility], [facility]    Diabetes mellitus without complication (HCC)    Difficulty walking    Per [facility], [facility]    Extended spectrum beta lactamase (ESBL) resistance    Per [facility], [facility]    Gastroesophageal reflux disease    Per [facility], [facility]    Glaucoma    Gram negative sepsis (HCC)    Per [facility], [facility]    Hypercholesteremia    Hypertension    Hypokalemia    Infection, bacterial    Per [facility], [facility]    Muscle weakness (generalized)    Per [facility], [facility]    Need for assistance with personal care    Per [facility], [facility]    OAB (overactive bladder)     Per [facility], [facility]    Ocular hypertension, bilateral    Per [facility], [facility]    Osteoarthritis of right knee    Per [facility], [facility]    Paraplegia [facility])    Pyelonephritis, acute    Per [facility], [facility]    Status post total right knee replacement    Unspecified dementia without behavioral disturbance (HCC)    Per [facility], [facility]    Urgency of urination    Per [facility], [facility]    Wheelchair dependence    Per Ria Bush, [facility]     Past Surgical History:  Procedure Laterality Date   ABDOMINAL HYSTERECTOMY     CATARACT EXTRACTION     COLONOSCOPY N/A 12/10/2013   Procedure: COLONOSCOPY;  Surgeon: West Bali, MD;  Location: [facility];  Service: Endoscopy;  Laterality: N/A;  8:30 AM   TOTAL KNEE ARTHROPLASTY Right 03/30/2016   Procedure: TOTAL KNEE ARTHROPLASTY;  Surgeon: Vickki Hearing, MD;  Location: [facility] ORS;  Service: Orthopedics;  Laterality: Right;    Social History   Socioeconomic History   Marital status: Divorced    Spouse name: Not on file   Number of  children: Not on file   Years of education: Not on file   Highest education level: Not on file  Occupational History   Occupation: retired   Tobacco Use   Smoking status: Some Days    Packs/day: 0.25    Years: 3.00    Pack years: 0.75    Types: Cigarettes   Smokeless tobacco: Never  Vaping Use   Vaping Use: Never used  Substance and Sexual Activity   Alcohol use: No   Drug use: No   Sexual activity: Not Currently    Birth control/protection: Post-menopausal  Other Topics Concern   Not on file  Social History Narrative   Long term resident of [facility]    Social Determinants of Health    Financial Resource Strain: Not on file  Food Insecurity: Not on file  Transportation Needs: Not on file  Physical Activity: Not on file  Stress: Not on file  Social Connections: Not on file  Intimate Partner Violence: Not on file   Family History  Problem Relation Age of Onset   Cancer Mother    Arthritis Mother    Hypotension Father    Cancer Sister    Hypotension Sister    Colon cancer Neg Hx       VITAL SIGNS BP 126/72   Pulse 81   Temp (!) 97 F (36.1 C) (Temporal)   Resp 16   Ht 5\' 5"  (1.651 m)   Wt 198 lb (89.8 kg)   SpO2 90%   BMI 32.95 kg/m   Outpatient Encounter Medications as of 04/21/2021  Medication Sig   acetaminophen (TYLENOL) 325 MG tablet Take 650 mg by mouth 2 (two) times daily.   ascorbic acid (VITAMIN C) 500 MG tablet Take 500 mg by mouth daily.   aspirin EC 81 MG tablet Take 81 mg by mouth daily.   calcium citrate (CALCITRATE - DOSED IN MG ELEMENTAL CALCIUM) 950 MG tablet Take 200 mg of elemental calcium by mouth daily.   diclofenac Sodium (VOLTAREN) 1 % GEL Apply 4 g topically 3 (three) times daily as needed. Special Instructions: apply to right knee   diclofenac Sodium (VOLTAREN) 1 % GEL Apply 2 g topically 2 (two) times daily as needed. Special Instructions: apply to left index finger for arthritis pain   enoxaparin (LOVENOX) 100 MG/ML injection Inject 100 mg into the skin every 12 (twelve) hours.   iron polysaccharides (NIFEREX) 150 MG capsule Take 1 capsule by mouth daily.   latanoprost (XALATAN) 0.005 % ophthalmic solution Place 1 drop into both eyes at bedtime. wait 5 minutes between multiple eye drops   NON FORMULARY Diet: NAS,   omeprazole (PRILOSEC) 20 MG capsule Take 20 mg by mouth daily. Special Instructions: *TAKE ON AN EMPTY STOMACH* FOR GERD *DO NOT CRUSH* (FORMULARY SUB FOR PANTOPRAZOLE 40MG )   oxybutynin (DITROPAN-XL) 10 MG 24 hr tablet Take 10 mg by mouth daily.   polyethylene glycol powder (GLYCOLAX/MIRALAX) 17 GM/SCOOP powder  Take by mouth once.   No facility-administered encounter medications on file as of 04/21/2021.     SIGNIFICANT DIAGNOSTIC EXAMS  PREVIOUS  02-05-20: DEXA: t score: -2.279  10-20-20: KUB:  There is a nonspecific bowel gas pattern without obstruction. Moderate to marked increase feces in the colon. No renal stone is seen.  01-10-21: kub: significant fecal material identified.   01-17-21: chest x-ray: no evidence of acute cardiopulmonary disease; communicable  disease or tuberculosis   02-18-21: ct angio of chest:  1. Negative for acute PE or  thoracic aortic dissection. 2.  Aortic Atherosclerosis   02-24-21: bilateral lower extremity venous doppler:  No evidence of deep venous thrombosis in either lower extremity.   02-24-21: KUB Moderate colonic stool burden. No evidence of bowel obstruction.  NO NEW EXAMS.    LABS REVIEWED PREVIOUS;   04-08-20: wbc 6.4; hgb 9.6; hct 32.0; mcv 82.3 plt 372; glucose 91; bun 19; creat 0.56; k+ 3.6; na++ 138; ca 8.7 liver normal albumin 2.9  05-18-20: hgb 10.0; hct 32.9  05-30-20: uric acid: 4.8 10-20-20: wbc 8.2; hgb 13.1; hct 42.8; mcv 80.0 plt 458; glucose 143; bun 17; creat 0.53; k+ 4.4; na++ 131; ca 9.1 GFR>60  liver normal albumin 3.9   01-17-21: wbc 4.3; hgb 10.3; hct 33.6; mcv 81.4 plt 385; glucose 105; bun 11; creat 0.50; k+ 3.5; na++ 134; ca 7.9; GFR >60; d-dimer: 5.93 CRP 5.3 01-24-21: d-dimer: 6.73  01-31-21: d-dimer: 5.30 02-07-21: d-dimer: 6.25 02-14-21: d-dimer: 12.74 02-24-21: d-dimer: >20 03-01-21: d-dimer: 14.18 03-10-21: d-dimer: 11.10 03-17-21; d-dimer: 10.44 03-22-21: d-dimer: 10.32  03-29-21: d-dimer: 10.97 04-11-21: d-dimer 12.12  NO NEW LABS.   Review of Systems  Constitutional:  Negative for malaise/fatigue.  Respiratory:  Negative for cough and shortness of breath.   Cardiovascular:  Negative for chest pain, palpitations and leg swelling.  Gastrointestinal:  Negative for abdominal pain, constipation and heartburn.  Musculoskeletal:   Negative for back pain, joint pain and myalgias.  Skin: Negative.   Neurological:  Negative for dizziness.  Psychiatric/Behavioral:  The patient is not nervous/anxious.    Physical Exam Constitutional:      General: She is not in acute distress.    Appearance: She is well-developed. She is not diaphoretic.  Neck:     Thyroid: No thyromegaly.  Cardiovascular:     Rate and Rhythm: Normal rate and regular rhythm.     Pulses: Normal pulses.     Heart sounds: Normal heart sounds.  Pulmonary:     Effort: Pulmonary effort is normal. No respiratory distress.     Breath sounds: Normal breath sounds.  Abdominal:     General: Bowel sounds are normal. There is no distension.     Palpations: Abdomen is soft.     Tenderness: There is no abdominal tenderness.  Musculoskeletal:     Cervical back: Neck supple.     Right lower leg: Edema present.     Left lower leg: Edema present.     Comments:   Is able to move all extremities History of right knee replacement with contracture      Lymphadenopathy:     Cervical: No cervical adenopathy.  Skin:    General: Skin is warm and dry.  Neurological:     Mental Status: She is alert. Mental status is at baseline.  Psychiatric:        Mood and Affect: Mood normal.    ASSESSMENT/ PLAN:  TODAY;   Primary osteoarthritis right knee; left index finger: is stable will continue tylenol 650 mg three times daily voltaren gel 4 gm to right knee three times daily and left index finger 2 gm twice daily   2. Essential hypertension: is stable b/p 126/72 will continue asa 81 mg daily   3. Chronic anemia: is stable hgb 10.3 will continue ferrex daily   PREVIOUS   4. Hypercoagulable state due to COVID 19: d-dimer is 12.12 will continue lovenox twice daily   5. GERD without esophagitis: is stable will continue protonix 40 mg daily   6. Urinary urgency: is  stable will continue ditropan xl 10 mg daily   7. Vascular dementia without behavioral disturbance: is  stable weight is 198 pounds; will monitor   8. Aortic atherosclerosis (ct 09-03-19) will monitor   9. Hypokalemia: is stable  4.4 will monitor  10. Increased intraocular pressure bilateral is stable will continue xalatan to both eyes.     Synthia Innocent NP [facility]  Contact [phone] Monday through Friday 8am- 5pm  After hours call [phone]

## 2021-04-25 ENCOUNTER — Other Ambulatory Visit (HOSPITAL_COMMUNITY)
Admission: RE | Admit: 2021-04-25 | Discharge: 2021-04-25 | Disposition: A | Discharge status: Home / Self Care | Payer: Medicare HMO | Source: Skilled Nursing Facility | Attending: Adult Health | Admitting: Adult Health

## 2021-04-25 ENCOUNTER — Non-Acute Institutional Stay (SKILLED_NURSING_FACILITY): Payer: Medicare HMO | Admitting: Adult Health

## 2021-04-25 ENCOUNTER — Encounter: Payer: Self-pay | Admitting: Adult Health

## 2021-04-25 DIAGNOSIS — U071 COVID-19: Secondary | ICD-10-CM

## 2021-04-25 DIAGNOSIS — D6869 Other thrombophilia: Secondary | ICD-10-CM | POA: Diagnosis not present

## 2021-04-25 LAB — D-DIMER, QUANTITATIVE: D-Dimer, Quant: 7.42 ug/mL-FEU — ABNORMAL HIGH (ref 0.00–0.50)

## 2021-04-25 NOTE — Progress Notes (Signed)
Location:  [facility] Room Number: 105-D Place of Service:  SNF (31)   CODE STATUS: Full code  No Known Allergies  Chief Complaint  Patient presents with   Acute Visit    Lab follow-up    HPI:  Her d-dimer has improved to 7.42. she remains on lovenox therapy. There are no indications of abnormal blood clotting and no reports of abnormal bleeding or bruising. There are no reports of cough; no shortness of breath.   Past Medical History:  Diagnosis Date   Abnormal posture    Per [facility], [facility]    Anemia    Cognitive communication deficit    Per [facility], [facility]    Contracture of left knee    Per [facility], [facility]    Diabetes mellitus without complication (HCC)    Difficulty walking    Per [facility], [facility]    Extended spectrum beta lactamase (ESBL) resistance    Per [facility], [facility]    Gastroesophageal reflux disease    Per [facility], [facility]    Glaucoma    Gram negative sepsis (HCC)    Per [facility], [facility]    Hypercholesteremia    Hypertension    Hypokalemia    Infection, bacterial    Per [facility], [facility]    Muscle weakness (generalized)    Per [facility], [facility]    Need for assistance with personal care    Per [facility], [facility]    OAB (overactive bladder)    Per [facility], [facility]    Ocular hypertension, bilateral    Per [facility], [facility]    Osteoarthritis of right knee    Per [facility]    Paraplegia [facility])    Pyelonephritis, acute    Per [facility], [facility]    Status post total right knee replacement    Unspecified dementia without behavioral disturbance    Per [facility], [facility]    Urgency of urination    Per [facility], [facility]    Wheelchair dependence    Per Ria Bush, [facility]     Past Surgical History:  Procedure Laterality Date   ABDOMINAL HYSTERECTOMY     CATARACT EXTRACTION     COLONOSCOPY N/A 12/10/2013   Procedure: COLONOSCOPY;  Surgeon: West Bali, MD;  Location: [facility];  Service: Endoscopy;  Laterality: N/A;  8:30 AM   TOTAL KNEE ARTHROPLASTY Right 03/30/2016   Procedure: TOTAL KNEE ARTHROPLASTY;  Surgeon: Vickki Hearing, MD;  Location: [facility];  Service: Orthopedics;  Laterality: Right;    Social History   Socioeconomic History   Marital status: Divorced    Spouse name: Not on file   Number of children: Not on file   Years of education: Not on file   Highest education level: Not on file  Occupational History   Occupation: retired   Tobacco Use   Smoking status: Some Days    Packs/day: 0.25    Years: 3.00  Pack years: 0.75    Types: Cigarettes   Smokeless tobacco: Never  Vaping Use   Vaping Use: Never used  Substance and Sexual Activity   Alcohol use: No   Drug use: No   Sexual activity: Not Currently    Birth control/protection: Post-menopausal  Other Topics Concern   Not on file  Social History Narrative   Long term resident of [facility]    Social Determinants of Health   Financial Resource Strain: Not on file  Food Insecurity: Not on file  Transportation Needs: Not on file  Physical Activity: Not on file  Stress: Not on file  Social Connections: Not on file  Intimate Partner Violence: Not on  file   Family History  Problem Relation Age of Onset   Cancer Mother    Arthritis Mother    Hypotension Father    Cancer Sister    Hypotension Sister    Colon cancer Neg Hx       VITAL SIGNS BP 121/73   Pulse 98   Temp 97.8 F (36.6 C)   Resp 16   Ht 5\' 5"  (1.651 m)   Wt 198 lb (89.8 kg)   SpO2 90%   BMI 32.95 kg/m   Outpatient Encounter Medications as of 04/25/2021  Medication Sig   acetaminophen (TYLENOL) 325 MG tablet Take 650 mg by mouth 2 (two) times daily.   ascorbic acid (VITAMIN C) 500 MG tablet Take 500 mg by mouth daily.   aspirin EC 81 MG tablet Take 81 mg by mouth daily.   calcium citrate (CALCITRATE - DOSED IN MG ELEMENTAL CALCIUM) 950 MG tablet Take 200 mg of elemental calcium by mouth daily.   diclofenac Sodium (VOLTAREN) 1 % GEL Apply 4 g topically 3 (three) times daily as needed. Special Instructions: apply to right knee   diclofenac Sodium (VOLTAREN) 1 % GEL Apply 2 g topically 2 (two) times daily as needed. Special Instructions: apply to left index finger for arthritis pain   enoxaparin (LOVENOX) 100 MG/ML injection Inject 100 mg into the skin every 12 (twelve) hours. D-Dimer >20.0   iron polysaccharides (NIFEREX) 150 MG capsule Take 1 capsule by mouth daily.   latanoprost (XALATAN) 0.005 % ophthalmic solution Place 1 drop into both eyes at bedtime. wait 5 minutes between multiple eye drops   NON FORMULARY Diet: NAS,   omeprazole (PRILOSEC) 20 MG capsule Take 20 mg by mouth daily. Special Instructions: *TAKE ON AN EMPTY STOMACH* FOR GERD *DO NOT CRUSH* (FORMULARY SUB FOR PANTOPRAZOLE 40MG )   oxybutynin (DITROPAN-XL) 10 MG 24 hr tablet Take 10 mg by mouth daily.   polyethylene glycol powder (GLYCOLAX/MIRALAX) 17 GM/SCOOP powder Take by mouth once.   No facility-administered encounter medications on file as of 04/25/2021.     SIGNIFICANT DIAGNOSTIC EXAMS   PREVIOUS  02-05-20: DEXA: t score: -2.279  10-20-20: KUB:  There is a nonspecific bowel gas  pattern without obstruction. Moderate to marked increase feces in the colon. No renal stone is seen.  01-10-21: kub: significant fecal material identified.   01-17-21: chest x-ray: no evidence of acute cardiopulmonary disease; communicable  disease or tuberculosis   02-18-21: ct angio of chest:  1. Negative for acute PE or thoracic aortic dissection. 2.  Aortic Atherosclerosis   02-24-21: bilateral lower extremity venous doppler:  No evidence of deep venous thrombosis in either lower extremity.   02-24-21: KUB Moderate colonic stool burden. No evidence of bowel obstruction.  NO NEW EXAMS.    LABS REVIEWED PREVIOUS;  04-08-20: wbc 6.4; hgb 9.6; hct 32.0; mcv 82.3 plt 372; glucose 91; bun 19; creat 0.56; k+ 3.6; na++ 138; ca 8.7 liver normal albumin 2.9  05-18-20: hgb 10.0; hct 32.9  05-30-20: uric acid: 4.8 10-20-20: wbc 8.2; hgb 13.1; hct 42.8; mcv 80.0 plt 458; glucose 143; bun 17; creat 0.53; k+ 4.4; na++ 131; ca 9.1 GFR>60  liver normal albumin 3.9   01-17-21: wbc 4.3; hgb 10.3; hct 33.6; mcv 81.4 plt 385; glucose 105; bun 11; creat 0.50; k+ 3.5; na++ 134; ca 7.9; GFR >60; d-dimer: 5.93 CRP 5.3 01-24-21: d-dimer: 6.73  01-31-21: d-dimer: 5.30 02-07-21: d-dimer: 6.25 02-14-21: d-dimer: 12.74 02-24-21: d-dimer: >20 03-01-21: d-dimer: 14.18 03-10-21: d-dimer: 11.10 03-17-21; d-dimer: 10.44 03-22-21: d-dimer: 10.32  03-29-21: d-dimer: 10.97 04-11-21: d-dimer 12.12  TODAY  04-25-21: d-dimer: 7.42    Review of Systems  Constitutional:  Negative for malaise/fatigue.  Respiratory:  Negative for cough and shortness of breath.   Cardiovascular:  Negative for chest pain, palpitations and leg swelling.  Gastrointestinal:  Negative for abdominal pain, constipation and heartburn.  Musculoskeletal:  Negative for back pain, joint pain and myalgias.  Skin: Negative.   Neurological:  Negative for dizziness.  Psychiatric/Behavioral:  The patient is not nervous/anxious.    Physical Exam Constitutional:       General: She is not in acute distress.    Appearance: She is well-developed. She is not diaphoretic.  Neck:     Thyroid: No thyromegaly.  Cardiovascular:     Rate and Rhythm: Normal rate and regular rhythm.     Pulses: Normal pulses.     Heart sounds: Normal heart sounds.  Pulmonary:     Effort: Pulmonary effort is normal. No respiratory distress.     Breath sounds: Normal breath sounds.  Abdominal:     General: Bowel sounds are normal. There is no distension.     Palpations: Abdomen is soft.     Tenderness: There is no abdominal tenderness.  Musculoskeletal:     Cervical back: Neck supple.     Right lower leg: No edema.     Left lower leg: No edema.     Comments:  Is able to move all extremities History of right knee replacement with contracture       Lymphadenopathy:     Cervical: No cervical adenopathy.  Skin:    General: Skin is warm and dry.  Neurological:     Mental Status: She is alert. Mental status is at baseline.  Psychiatric:        Mood and Affect: Mood normal.     ASSESSMENT/ PLAN:  TODAY  Hypercoagulable state associated with COVID 19: will continue lovenox therapy; will repeat d-dimer on 05-09-21; her level is starting to improve.    Synthia Innocent NP [facility]  Contact [phone] Monday through Friday 8am- 5pm  After hours call [phone]

## 2021-05-05 ENCOUNTER — Ambulatory Visit (HOSPITAL_COMMUNITY)
Admission: RE | Admit: 2021-05-05 | Discharge: 2021-05-05 | Disposition: A | Discharge status: Home / Self Care | Payer: Medicare HMO | Source: Ambulatory Visit | Attending: Adult Health | Admitting: Adult Health

## 2021-05-05 ENCOUNTER — Encounter (HOSPITAL_COMMUNITY): Payer: Self-pay | Admitting: Radiology

## 2021-05-05 DIAGNOSIS — R7989 Other specified abnormal findings of blood chemistry: Secondary | ICD-10-CM | POA: Insufficient documentation

## 2021-05-05 MED ORDER — IOHEXOL 350 MG/ML SOLN
100.0000 mL | Freq: Once | INTRAVENOUS | Status: AC | PRN
  Administered 2021-05-05: 75 mL via INTRAVENOUS

## 2021-05-06 LAB — POCT I-STAT CREATININE: Creatinine, Ser: 0.5 mg/dL (ref 0.44–1.00)

## 2021-05-09 ENCOUNTER — Encounter: Payer: Self-pay | Admitting: Adult Health

## 2021-05-09 ENCOUNTER — Other Ambulatory Visit (HOSPITAL_COMMUNITY)
Admission: RE | Admit: 2021-05-09 | Discharge: 2021-05-09 | Disposition: A | Discharge status: Home / Self Care | Payer: Medicare HMO | Source: Ambulatory Visit | Attending: Adult Health | Admitting: Adult Health

## 2021-05-09 ENCOUNTER — Non-Acute Institutional Stay (SKILLED_NURSING_FACILITY): Payer: Medicare HMO | Admitting: Adult Health

## 2021-05-09 ENCOUNTER — Other Ambulatory Visit: Payer: Self-pay

## 2021-05-09 ENCOUNTER — Other Ambulatory Visit (HOSPITAL_COMMUNITY)
Admission: RE | Admit: 2021-05-09 | Discharge: 2021-05-09 | Disposition: A | Discharge status: Home / Self Care | Payer: Medicare HMO | Source: Skilled Nursing Facility | Attending: Adult Health | Admitting: Adult Health

## 2021-05-09 DIAGNOSIS — D6869 Other thrombophilia: Secondary | ICD-10-CM

## 2021-05-09 DIAGNOSIS — U099 Post covid-19 condition, unspecified: Secondary | ICD-10-CM | POA: Diagnosis not present

## 2021-05-09 DIAGNOSIS — D6859 Other primary thrombophilia: Secondary | ICD-10-CM | POA: Diagnosis present

## 2021-05-09 DIAGNOSIS — U071 COVID-19: Secondary | ICD-10-CM | POA: Diagnosis not present

## 2021-05-09 LAB — D-DIMER, QUANTITATIVE: D-Dimer, Quant: 5.6 ug/mL-FEU — ABNORMAL HIGH (ref 0.00–0.50)

## 2021-05-09 NOTE — Progress Notes (Signed)
Location:  [facility] Room Number: 151 P Place of Service:  SNF (31)   CODE STATUS: FULL CODE  No Known Allergies  Chief Complaint  Patient presents with   Acute Visit    Lab work Follow Up    HPI:  She is on lovenox therapy for an elevated d-dimer. There are no indications of abnormal clotting present. There are no reports of chest pain or shortness of breath.   Past Medical History:  Diagnosis Date   Abnormal posture    Per [employer], [facility] Electronic Medical Records System    Anemia    Cognitive communication deficit    Per [facility], [facility] Records [facility]    Contracture of left knee    Per [facility], [facility] Records [facility]    Diabetes mellitus without complication (HCC)    Difficulty walking    Per [facility], [facility] Records [facility]    Extended spectrum beta lactamase (ESBL) resistance    Per [facility], [facility] Records [facility]    Gastroesophageal reflux disease    Per [facility], [facility] Records [facility]    Glaucoma    Gram negative sepsis (HCC)    Per [facility], [facility] Records [facility]    Hypercholesteremia    Hypertension    Hypokalemia    Infection, bacterial    Per [facility], [facility] Records [facility]    Muscle weakness (generalized)    Per [facility], [facility] Records [facility]    Need for assistance with personal care    Per [facility], [facility] Records [facility]    OAB (overactive bladder)    Per [facility], [facility] Records [facility]    Ocular hypertension, bilateral    Per [facility], [facility] Records [facility]    Osteoarthritis of right knee    Per [facility], [facility] Records [facility]     Paraplegia [facility])    Pyelonephritis, acute    Per [facility], [facility] Records [facility]    Status post total right knee replacement    Unspecified dementia without behavioral disturbance    Per [facility], [facility] Records [facility]    Urgency of urination    Per [facility], [facility] Records [facility]    Wheelchair dependence    Per [facility], [facility] Electronic [facility] Records [facility]     Past Surgical History:  Procedure Laterality Date   ABDOMINAL HYSTERECTOMY     CATARACT EXTRACTION     COLONOSCOPY N/A 12/10/2013   Procedure: COLONOSCOPY;  Surgeon: West Bali, MD;  Location: [facility];  Service: Endoscopy;  Laterality: N/A;  8:30 AM   TOTAL KNEE ARTHROPLASTY Right 03/30/2016   Procedure: TOTAL KNEE ARTHROPLASTY;  Surgeon: Vickki Hearing, MD;  Location: [facility] ORS;  Service: Orthopedics;  Laterality: Right;    Social History   Socioeconomic History   Marital status: Divorced    Spouse name: Not on file   Number of children: Not on file   Years of education: Not on file   Highest education level: Not on file  Occupational History   Occupation: retired   Tobacco Use   Smoking status: Some Days    Packs/day: 0.25    Years: 3.00    Pack years: 0.75  Types: Cigarettes   Smokeless tobacco: Never  Vaping Use   Vaping Use: Never used  Substance and Sexual Activity   Alcohol use: No   Drug use: No   Sexual activity: Not Currently    Birth control/protection: Post-menopausal  Other Topics Concern   Not on file  Social History Narrative   Long term resident of [facility]    Social Determinants of Health   Financial Resource Strain: Not on file  Food Insecurity: Not on file  Transportation Needs: Not on file  Physical Activity: Not on file  Stress: Not on file  Social Connections: Not on file  Intimate Partner Violence: Not on file   Family History  Problem Relation Age of Onset    Cancer Mother    Arthritis Mother    Hypotension Father    Cancer Sister    Hypotension Sister    Colon cancer Neg Hx       VITAL SIGNS BP 114/73   Pulse 92   Temp (!) 97.2 F (36.2 C)   Resp 16   Ht 5\' 5"  (1.651 m)   Wt 205 lb 12.8 oz (93.4 kg)   SpO2 90%   BMI 34.25 kg/m   Outpatient Encounter Medications as of 05/09/2021  Medication Sig   acetaminophen (TYLENOL) 325 MG tablet Take 650 mg by mouth 2 (two) times daily.   ascorbic acid (VITAMIN C) 500 MG tablet Take 500 mg by mouth daily.   aspirin EC 81 MG tablet Take 81 mg by mouth daily.   calcium citrate (CALCITRATE - DOSED IN MG ELEMENTAL CALCIUM) 950 MG tablet Take 200 mg of elemental calcium by mouth daily.   diclofenac Sodium (VOLTAREN) 1 % GEL Apply 4 g topically 3 (three) times daily as needed. Special Instructions: apply to right knee   diclofenac Sodium (VOLTAREN) 1 % GEL Apply 2 g topically 2 (two) times daily as needed. Special Instructions: apply to left index finger for arthritis pain   enoxaparin (LOVENOX) 100 MG/ML injection Inject 100 mg into the skin every 12 (twelve) hours. D-Dimer >20.0   iron polysaccharides (NIFEREX) 150 MG capsule Take 1 capsule by mouth daily.   latanoprost (XALATAN) 0.005 % ophthalmic solution Place 1 drop into both eyes at bedtime. wait 5 minutes between multiple eye drops   NON FORMULARY Diet: NAS,   omeprazole (PRILOSEC) 20 MG capsule Take 20 mg by mouth daily. Special Instructions: *TAKE ON AN EMPTY STOMACH* FOR GERD *DO NOT CRUSH* (FORMULARY SUB FOR PANTOPRAZOLE 40MG )   oxybutynin (DITROPAN-XL) 10 MG 24 hr tablet Take 10 mg by mouth daily.   polyethylene glycol powder (GLYCOLAX/MIRALAX) 17 GM/SCOOP powder Take by mouth once.   No facility-administered encounter medications on file as of 05/09/2021.     SIGNIFICANT DIAGNOSTIC EXAMS   PREVIOUS  02-05-20: DEXA: t score: -2.279  10-20-20: KUB:  There is a nonspecific bowel gas pattern without obstruction. Moderate to  marked increase feces in the colon. No renal stone is seen.  01-10-21: kub: significant fecal material identified.   01-17-21: chest x-ray: no evidence of acute cardiopulmonary disease; communicable  disease or tuberculosis   02-18-21: ct angio of chest:  1. Negative for acute PE or thoracic aortic dissection. 2.  Aortic Atherosclerosis   02-24-21: bilateral lower extremity venous doppler:  No evidence of deep venous thrombosis in either lower extremity.   02-24-21: KUB Moderate colonic stool burden. No evidence of bowel obstruction.  TODAY  05-05-21: ct angio of chest:  1. No CT evidence  for acute pulmonary embolus. 2. Bilateral lower lobe bronchial wall thickening with scattered areas of peripheral airway impaction. Imaging features suggest chronic infectious/inflammatory etiology. 3. Aortic Atherosclerosis    LABS REVIEWED PREVIOUS;   04-08-20: wbc 6.4; hgb 9.6; hct 32.0; mcv 82.3 plt 372; glucose 91; bun 19; creat 0.56; k+ 3.6; na++ 138; ca 8.7 liver normal albumin 2.9  05-18-20: hgb 10.0; hct 32.9  05-30-20: uric acid: 4.8 10-20-20: wbc 8.2; hgb 13.1; hct 42.8; mcv 80.0 plt 458; glucose 143; bun 17; creat 0.53; k+ 4.4; na++ 131; ca 9.1 GFR>60  liver normal albumin 3.9   01-17-21: wbc 4.3; hgb 10.3; hct 33.6; mcv 81.4 plt 385; glucose 105; bun 11; creat 0.50; k+ 3.5; na++ 134; ca 7.9; GFR >60; d-dimer: 5.93 CRP 5.3 01-24-21: d-dimer: 6.73  01-31-21: d-dimer: 5.30 02-07-21: d-dimer: 6.25 02-14-21: d-dimer: 12.74 02-24-21: d-dimer: >20 03-01-21: d-dimer: 14.18 03-10-21: d-dimer: 11.10 03-17-21; d-dimer: 10.44 03-22-21: d-dimer: 10.32  03-29-21: d-dimer: 10.97 04-11-21: d-dimer 12.12  TODAY  04-25-21: d-dimer: 7.42   05-09-21: d-dimer: 5.60   Review of Systems  Constitutional:  Negative for malaise/fatigue.  Respiratory:  Negative for cough and shortness of breath.   Cardiovascular:  Negative for chest pain, palpitations and leg swelling.  Gastrointestinal:  Negative for abdominal pain,  constipation and heartburn.  Musculoskeletal:  Negative for back pain, joint pain and myalgias.  Skin: Negative.   Neurological:  Negative for dizziness.  Psychiatric/Behavioral:  The patient is not nervous/anxious.    Physical Exam Constitutional:      General: She is not in acute distress.    Appearance: She is well-developed. She is obese. She is not diaphoretic.  Neck:     Thyroid: No thyromegaly.  Cardiovascular:     Rate and Rhythm: Normal rate and regular rhythm.     Pulses: Normal pulses.     Heart sounds: Normal heart sounds.  Pulmonary:     Effort: Pulmonary effort is normal. No respiratory distress.     Breath sounds: Normal breath sounds.  Abdominal:     General: Bowel sounds are normal. There is no distension.     Palpations: Abdomen is soft.     Tenderness: There is no abdominal tenderness.  Musculoskeletal:     Cervical back: Neck supple.     Right lower leg: No edema.     Left lower leg: No edema.     Comments:  Is able to move all extremities History of right knee replacement with contracture      Lymphadenopathy:     Cervical: No cervical adenopathy.  Skin:    General: Skin is warm and dry.  Neurological:     Mental Status: She is alert. Mental status is at baseline.  Psychiatric:        Mood and Affect: Mood normal.      ASSESSMENT/ PLAN:  TODAY  Hypercoagulable state associated with covid 19  Will continue lovenox therapy will checck d-dimer on 05-23-21 Will monitor her status.    Synthia Innocent NP [facility]  Contact [phone] Monday through Friday 8am- 5pm  After hours call [phone]

## 2021-05-17 ENCOUNTER — Encounter: Payer: Self-pay | Admitting: Internal Medicine

## 2021-05-17 ENCOUNTER — Non-Acute Institutional Stay (SKILLED_NURSING_FACILITY): Payer: Medicare HMO | Admitting: Internal Medicine

## 2021-05-17 DIAGNOSIS — D649 Anemia, unspecified: Secondary | ICD-10-CM | POA: Diagnosis not present

## 2021-05-17 DIAGNOSIS — R000 Tachycardia, unspecified: Secondary | ICD-10-CM | POA: Diagnosis not present

## 2021-05-17 DIAGNOSIS — E1322 Other specified diabetes mellitus with diabetic chronic kidney disease: Secondary | ICD-10-CM

## 2021-05-17 DIAGNOSIS — D6869 Other thrombophilia: Secondary | ICD-10-CM

## 2021-05-17 DIAGNOSIS — I10 Essential (primary) hypertension: Secondary | ICD-10-CM

## 2021-05-17 DIAGNOSIS — U071 COVID-19: Secondary | ICD-10-CM

## 2021-05-17 DIAGNOSIS — N182 Chronic kidney disease, stage 2 (mild): Secondary | ICD-10-CM

## 2021-05-17 DIAGNOSIS — I129 Hypertensive chronic kidney disease with stage 1 through stage 4 chronic kidney disease, or unspecified chronic kidney disease: Secondary | ICD-10-CM

## 2021-05-17 NOTE — Patient Instructions (Addendum)
#  1 comprehensive physical exam; no acute findings See assessment and plan under each diagnosis in the problem list and acutely for this visit   

## 2021-05-17 NOTE — Assessment & Plan Note (Signed)
Hypochromic, normocytic anemia is stable with H/H of 10.1/33.7.  Patient denies any active bleeding dyscrasias but recently did have some epistaxis.

## 2021-05-17 NOTE — Assessment & Plan Note (Signed)
Current A1c is 6.4% indicating excellent control without medication.  CKD is stable with creatinine of 0.5 and GFR greater than 60, stage II.

## 2021-05-17 NOTE — Assessment & Plan Note (Addendum)
05/17/2021 she has equivocal Anna Haggard' sign in the right calf.  She is very uncomfortable with the Lovenox shots.   Doppler was negative 02/24/21. I will discuss with the [facility] NP performing Doppler on the right lower extremity.  Consider resuming the Eliquis and lieu of Lovenox.

## 2021-05-17 NOTE — Assessment & Plan Note (Addendum)
Anemia is stable and not severe enough to explain the tachycardia at rest.  TSH was last performed 12/13/2016 and was 1.647.  It will be updated.

## 2021-05-17 NOTE — Progress Notes (Signed)
NURSING HOME LOCATION:  [facility] ROOM NUMBER:  151 P  CODE STATUS:  Full Code  PCP:  Synthia Innocent NP  This is a nursing facility follow up visit of chronic medical diagnoses & to document compliance with Regulation 483.30 (c) in The Long Term Care Survey Manual Phase 2 which mandates caregiver visit ( visits can alternate among physician, PA or NP as per statutes) within 10 days of 30 days / 60 days/ 90 days post admission to SNF date    Interim medical record and care since last SNF visit was updated with review of diagnostic studies and change in clinical status since last visit were documented.  HPI: She is a permanent resident of this facility with medical diagnoses of history of chronic anemia, GERD, glaucoma, dyslipidemia, essential hypertension, OAB, history of pyelonephritis, and diabetes with neurovascular complications. Serial labs were reviewed.  Renal function has remained stable at CKD stage II with a creatinine of 0.5 and GFR greater than 60.  The hypochromic, normocytic anemia is essentially stable with H/H of 10.1/33.7.  Her hypercoagulable state has improved.  The lowest D-dimer reported was 5.30 and the highest greater than 20.  Presently the D-dimer is 5.6; she remains on Lovenox.  Review of systems: Her only complaints are pain in the abdominal area related to the Lovenox injections and severe pain in the right knee.  The pain in the knee awakens her.  Ice is applied topically after therapy as well as a topical anti-inflammatory with benefit.  She did have epistaxis recently which has resolved; she denies any other bleeding dyscrasias.  She denies any past medical history of DVT or PTE.  Constitutional: No fever, significant weight change, fatigue  Eyes: No redness, discharge, pain, vision change ENT/mouth: No nasal congestion,  purulent discharge, earache, change in hearing, sore throat  Cardiovascular: No chest pain, palpitations, paroxysmal  nocturnal dyspnea, claudication, edema  Respiratory: No cough, sputum production, hemoptysis, DOE, significant snoring, apnea   Gastrointestinal: No heartburn, dysphagia,  nausea /vomiting, rectal bleeding, melena, change in bowels Genitourinary: No dysuria, hematuria, pyuria, incontinence, nocturia Dermatologic: No rash, pruritus, change in appearance of skin Neurologic: No dizziness, headache, syncope, seizures, numbness, tingling Psychiatric: No significant anxiety, depression, insomnia, anorexia Endocrine: No change in hair/skin/nails, excessive thirst, excessive hunger, excessive urination  Hematologic/lymphatic: No significant bruising, lymphadenopathy Allergy/immunology: No itchy/watery eyes, significant sneezing, urticaria, angioedema  Physical exam:  Pertinent or positive findings: She is wheelchair-bound due to severe arthritis in the right knee. She is hard of hearing.  She is edentulous and not wearing plates.  She has resting tachycardia.  Low-grade rales are present diffusely.  Abdomen is protuberant.  There is diffuse bruising related to the Lovenox shots.  Pedal pulses are nonpalpable.  She has 1/2+ edema at the sock line.  There is equivocal positive Denna Haggard' sign of the right calf.  The right knee is massively enlarged and there is a contracture of the right lower extremity.  General appearance: Adequately nourished; no acute distress, increased work of breathing is present.   Lymphatic: No lymphadenopathy about the head, neck, axilla. Eyes: No conjunctival inflammation or lid edema is present. There is no scleral icterus. Ears:  External ear exam shows no significant lesions or deformities.   Nose:  External nasal examination shows no deformity or inflammation. Nasal mucosa are pink and moist without lesions, exudates Neck:  No thyromegaly, masses, tenderness noted.    Heart:  No murmur, click, rub .  Lungs:  without wheezes, rhonchi, rubs. Abdomen: Bowel sounds are normal.  Abdomen is soft with no organomegaly, hernias, masses. GU: Deferred  Extremities:  No cyanosis, clubbing  Neurologic exam :Balance, Rhomberg, finger to nose testing could not be completed due to clinical state Skin: Warm & dry w/o tenting. No significant rash.  See summary under each active problem in the Problem List with associated updated therapeutic plan

## 2021-05-19 ENCOUNTER — Non-Acute Institutional Stay (SKILLED_NURSING_FACILITY): Payer: Medicare HMO | Admitting: Adult Health

## 2021-05-19 ENCOUNTER — Other Ambulatory Visit (HOSPITAL_COMMUNITY)
Admission: RE | Admit: 2021-05-19 | Discharge: 2021-05-19 | Disposition: A | Discharge status: Home / Self Care | Payer: Medicare HMO | Source: Skilled Nursing Facility | Attending: Adult Health | Admitting: Adult Health

## 2021-05-19 DIAGNOSIS — D6869 Other thrombophilia: Secondary | ICD-10-CM

## 2021-05-19 DIAGNOSIS — U099 Post covid-19 condition, unspecified: Secondary | ICD-10-CM | POA: Insufficient documentation

## 2021-05-19 DIAGNOSIS — U071 COVID-19: Secondary | ICD-10-CM | POA: Diagnosis not present

## 2021-05-19 LAB — D-DIMER, QUANTITATIVE: D-Dimer, Quant: 5.63 ug/mL-FEU — ABNORMAL HIGH (ref 0.00–0.50)

## 2021-05-19 NOTE — Progress Notes (Signed)
Location:  [facility] Room Number: 105 Place of Service:  SNF (31)   CODE STATUS: full code   No Known Allergies  Chief Complaint  Patient presents with   Acute Visit    Follow up labs     HPI:  Her d-dimer is 5.63. she is presently taking eliquis 5 mg twice daily. She was changed from lovenox after discussion with Dr. Alwyn Ren. Her d-dimer is presently without change at peak was >20. There are no reports of shortness of breath; no chest pain.   Past Medical History:  Diagnosis Date   Abnormal posture    Per [facility], [facility] Records [facility]    Anemia    Cognitive communication deficit    Per [facility], [facility]    Contracture of left knee    Per [facility], [facility]    Diabetes mellitus without complication (HCC)    Difficulty walking    Per [facility], [facility]    Extended spectrum beta lactamase (ESBL) resistance    Per [facility], [facility]    Gastroesophageal reflux disease    Per [facility], [facility]    Glaucoma    Gram negative sepsis (HCC)    Per [facility], [facility]    Hypercholesteremia    Hypertension    Hypokalemia    Infection, bacterial    Per [facility], [facility]    Muscle weakness (generalized)    Per [facility], [facility]    Need for assistance with personal care    Per [facility], [facility]    OAB (overactive bladder)    Per [facility], [facility]    Ocular hypertension, bilateral    Per [facility], [facility]    Osteoarthritis of right  knee    Per [facility], [facility]    Paraplegia [facility])    Pyelonephritis, acute    Per [facility], [facility]    Status post total right knee replacement    Unspecified dementia without behavioral disturbance    Per [facility], [facility]    Urgency of urination    Per [facility], [facility]    Wheelchair dependence    Per Ria Bush, [facility]     Past Surgical History:  Procedure Laterality Date   ABDOMINAL HYSTERECTOMY     CATARACT EXTRACTION     COLONOSCOPY N/A 12/10/2013   Procedure: COLONOSCOPY;  Surgeon: West Bali, MD;  Location: [facility];  Service: Endoscopy;  Laterality: N/A;  8:30 AM   TOTAL KNEE ARTHROPLASTY Right 03/30/2016   Procedure: TOTAL KNEE ARTHROPLASTY;  Surgeon: Vickki Hearing, MD;  Location: [facility] ORS;  Service: Orthopedics;  Laterality: Right;    Social History   Socioeconomic History   Marital status: Divorced    Spouse name: Not on file   Number of children: Not on file   Years of education: Not on file   Highest education level: Not on file  Occupational History   Occupation: retired   Tobacco Use   Smoking status: Former  Packs/day: 0.25    Years: 3.00    Pack years: 0.75    Types: Cigarettes   Smokeless tobacco: Never  Vaping Use   Vaping Use: Never used  Substance and Sexual Activity   Alcohol use: No   Drug use: No   Sexual activity: Not Currently    Birth control/protection: Post-menopausal  Other Topics Concern   Not on file  Social History Narrative   Long term resident of [facility]    Social Determinants of Health   Financial Resource Strain: Not on file  Food Insecurity: Not on file  Transportation Needs: Not on file  Physical Activity: Not on file  Stress: Not on file  Social Connections: Not on file  Intimate  Partner Violence: Not on file   Family History  Problem Relation Age of Onset   Cancer Mother    Arthritis Mother    Hypotension Father    Cancer Sister    Hypotension Sister    Colon cancer Neg Hx       VITAL SIGNS BP 138/70   Pulse 96   Temp 97.9 F (36.6 C)   Ht 5\' 5"  (1.651 m)   Wt 205 lb 12.8 oz (93.4 kg)   BMI 34.25 kg/m   Outpatient Encounter Medications as of 05/19/2021  Medication Sig   acetaminophen (TYLENOL) 325 MG tablet Take 650 mg by mouth 2 (two) times daily.   apixaban (ELIQUIS) 5 MG TABS tablet Take 5 mg by mouth 2 (two) times daily.   ascorbic acid (VITAMIN C) 500 MG tablet Take 500 mg by mouth daily.   aspirin EC 81 MG tablet Take 81 mg by mouth daily.   calcium citrate (CALCITRATE - DOSED IN MG ELEMENTAL CALCIUM) 950 MG tablet Take 200 mg of elemental calcium by mouth daily.   diclofenac Sodium (VOLTAREN) 1 % GEL Apply 4 g topically 3 (three) times daily as needed. Special Instructions: apply to right knee   diclofenac Sodium (VOLTAREN) 1 % GEL Apply 2 g topically 2 (two) times daily as needed. Special Instructions: apply to left index finger for arthritis pain   iron polysaccharides (NIFEREX) 150 MG capsule Take 1 capsule by mouth daily.   latanoprost (XALATAN) 0.005 % ophthalmic solution Place 1 drop into both eyes at bedtime. wait 5 minutes between multiple eye drops   NON FORMULARY Diet: NAS,   omeprazole (PRILOSEC) 20 MG capsule Take 20 mg by mouth daily. Special Instructions: *TAKE ON AN EMPTY STOMACH* FOR GERD *DO NOT CRUSH* (FORMULARY SUB FOR PANTOPRAZOLE 40MG )   oxybutynin (DITROPAN-XL) 10 MG 24 hr tablet Take 10 mg by mouth daily.   polyethylene glycol powder (GLYCOLAX/MIRALAX) 17 GM/SCOOP powder Take by mouth once.   [DISCONTINUED] enoxaparin (LOVENOX) 100 MG/ML injection Inject 100 mg into the skin every 12 (twelve) hours. D-Dimer >20.0   No facility-administered encounter medications on file as of 05/19/2021.     SIGNIFICANT DIAGNOSTIC  EXAMS   PREVIOUS  02-05-20: DEXA: t score: -2.279  10-20-20: KUB:  There is a nonspecific bowel gas pattern without obstruction. Moderate to marked increase feces in the colon. No renal stone is seen.  01-10-21: kub: significant fecal material identified.   01-17-21: chest x-ray: no evidence of acute cardiopulmonary disease; communicable  disease or tuberculosis   02-18-21: ct angio of chest:  1. Negative for acute PE or thoracic aortic dissection. 2.  Aortic Atherosclerosis   02-24-21: bilateral lower extremity venous doppler:  No evidence of deep venous thrombosis in either lower extremity.  02-24-21: KUB Moderate colonic stool burden. No evidence of bowel obstruction.  TODAY  05-05-21: ct angio of chest:  1. No CT evidence for acute pulmonary embolus. 2. Bilateral lower lobe bronchial wall thickening with scattered areas of peripheral airway impaction. Imaging features suggest chronic infectious/inflammatory etiology. 3. Aortic Atherosclerosis   05-19-21: right lower extremity venous doppler:  1. No evidence of deep vein thrombosis within the visualized veins of the right lower extremity. If there are persistent symptoms, repeat venous Doppler examination may be obtained in 3 to 5 days.  2. Right mid femoral and right distal femoral veins not well visualized due to body habitus and patient could not tolerate compression.   LABS REVIEWED PREVIOUS;    05-18-20: hgb 10.0; hct 32.9  05-30-20: uric acid: 4.8 10-20-20: wbc 8.2; hgb 13.1; hct 42.8; mcv 80.0 plt 458; glucose 143; bun 17; creat 0.53; k+ 4.4; na++ 131; ca 9.1 GFR>60  liver normal albumin 3.9   01-17-21: wbc 4.3; hgb 10.3; hct 33.6; mcv 81.4 plt 385; glucose 105; bun 11; creat 0.50; k+ 3.5; na++ 134; ca 7.9; GFR >60; d-dimer: 5.93 CRP 5.3 01-24-21: d-dimer: 6.73  01-31-21: d-dimer: 5.30 02-07-21: d-dimer: 6.25 02-14-21: d-dimer: 12.74 02-24-21: d-dimer: >20 03-01-21: d-dimer: 14.18 03-10-21: d-dimer: 11.10 03-17-21; d-dimer:  10.44 03-22-21: d-dimer: 10.32  03-29-21: d-dimer: 10.97 04-11-21: d-dimer 12.12  TODAY  04-25-21: d-dimer: 7.42   05-09-21: d-dimer: 5.60  05-19-21: d-dimer: 5.63  Review of Systems  Constitutional:  Negative for malaise/fatigue.  Respiratory:  Negative for cough and shortness of breath.   Cardiovascular:  Negative for chest pain, palpitations and leg swelling.  Gastrointestinal:  Negative for abdominal pain, constipation and heartburn.  Musculoskeletal:  Negative for back pain, joint pain and myalgias.  Skin: Negative.   Neurological:  Negative for dizziness.  Psychiatric/Behavioral:  The patient is not nervous/anxious.    Physical Exam Constitutional:      General: She is not in acute distress.    Appearance: She is well-developed. She is obese. She is not diaphoretic.  Neck:     Thyroid: No thyromegaly.  Cardiovascular:     Rate and Rhythm: Normal rate and regular rhythm.     Pulses: Normal pulses.     Heart sounds: Normal heart sounds.  Pulmonary:     Effort: Pulmonary effort is normal. No respiratory distress.     Breath sounds: Normal breath sounds.  Abdominal:     General: Bowel sounds are normal. There is no distension.     Palpations: Abdomen is soft.     Tenderness: There is no abdominal tenderness.  Musculoskeletal:     Cervical back: Neck supple.     Right lower leg: No edema.     Left lower leg: No edema.     Comments:   Is able to move all extremities History of right knee replacement with contracture       Lymphadenopathy:     Cervical: No cervical adenopathy.  Skin:    General: Skin is warm and dry.  Neurological:     Mental Status: She is alert. Mental status is at baseline.  Psychiatric:        Mood and Affect: Mood normal.     ASSESSMENT/ PLAN:  TODAY  Hypercoagulable state associated with COVID 19: is without change will continue eliquis 5 mg twice daily and will check d-dimer on 06-01-21    Synthia Innocent NP [facility]   Contact [phone] Monday through Friday 8am- 5pm  After hours call  [phone]   

## 2021-05-20 ENCOUNTER — Inpatient Hospital Stay
Admission: RE | Admit: 2021-05-20 | Discharge: 2022-08-14 | Disposition: A | Discharge status: Skilled Nursing Facility | Payer: Medicare HMO | Source: Ambulatory Visit | Attending: Internal Medicine | Admitting: Internal Medicine

## 2021-05-20 NOTE — Assessment & Plan Note (Signed)
BP controlled w/o antihypertensive medications. Continue monitor.  

## 2021-05-24 ENCOUNTER — Other Ambulatory Visit (HOSPITAL_COMMUNITY)
Admission: RE | Admit: 2021-05-24 | Discharge: 2021-05-24 | Disposition: A | Discharge status: Home / Self Care | Payer: Medicare HMO | Source: Skilled Nursing Facility | Attending: Adult Health | Admitting: Adult Health

## 2021-05-24 ENCOUNTER — Encounter: Payer: Self-pay | Admitting: Adult Health

## 2021-05-24 ENCOUNTER — Non-Acute Institutional Stay (SKILLED_NURSING_FACILITY): Payer: Medicare HMO | Admitting: Adult Health

## 2021-05-24 DIAGNOSIS — N10 Acute tubulo-interstitial nephritis: Secondary | ICD-10-CM | POA: Insufficient documentation

## 2021-05-24 DIAGNOSIS — D649 Anemia, unspecified: Secondary | ICD-10-CM | POA: Diagnosis not present

## 2021-05-24 LAB — COMPREHENSIVE METABOLIC PANEL
ALT: 10 U/L (ref 0–44)
AST: 11 U/L — ABNORMAL LOW (ref 15–41)
Albumin: 3.1 g/dL — ABNORMAL LOW (ref 3.5–5.0)
Alkaline Phosphatase: 59 U/L (ref 38–126)
Anion gap: 7 (ref 5–15)
BUN: 16 mg/dL (ref 8–23)
CO2: 26 mmol/L (ref 22–32)
Calcium: 8.5 mg/dL — ABNORMAL LOW (ref 8.9–10.3)
Chloride: 104 mmol/L (ref 98–111)
Creatinine, Ser: 0.46 mg/dL (ref 0.44–1.00)
GFR, Estimated: >60 mL/min (ref >60)
Glucose, Bld: 92 mg/dL (ref 70–99)
Potassium: 3.8 mmol/L (ref 3.5–5.1)
Sodium: 137 mmol/L (ref 135–145)
Total Bilirubin: 0.2 mg/dL — ABNORMAL LOW (ref 0.3–1.2)
Total Protein: 6.8 g/dL (ref 6.5–8.1)

## 2021-05-24 LAB — CBC WITH DIFFERENTIAL/PLATELET
Abs Immature Granulocytes: 0.02 10*3/uL (ref 0.00–0.07)
Basophils Absolute: 0.1 10*3/uL (ref 0.0–0.1)
Basophils Relative: 1 %
Eosinophils Absolute: 0.5 10*3/uL (ref 0.0–0.5)
Eosinophils Relative: 8 %
HCT: 30.4 % — ABNORMAL LOW (ref 36.0–46.0)
Hemoglobin: 8.9 g/dL — ABNORMAL LOW (ref 12.0–15.0)
Immature Granulocytes: 0 %
Lymphocytes Relative: 33 %
Lymphs Abs: 2.2 10*3/uL (ref 0.7–4.0)
MCH: 23.7 pg — ABNORMAL LOW (ref 26.0–34.0)
MCHC: 29.3 g/dL — ABNORMAL LOW (ref 30.0–36.0)
MCV: 81.1 fL (ref 80.0–100.0)
Monocytes Absolute: 0.6 10*3/uL (ref 0.1–1.0)
Monocytes Relative: 9 %
Neutro Abs: 3.3 10*3/uL (ref 1.7–7.7)
Neutrophils Relative %: 49 %
Platelets: 453 10*3/uL — ABNORMAL HIGH (ref 150–400)
RBC: 3.75 MIL/uL — ABNORMAL LOW (ref 3.87–5.11)
RDW: 15.9 % — ABNORMAL HIGH (ref 11.5–15.5)
WBC: 6.6 10*3/uL (ref 4.0–10.5)
nRBC: 0 % (ref 0.0–0.2)

## 2021-05-24 NOTE — Progress Notes (Signed)
Location:  [facility] Room Number: 105-D Place of Service:  SNF (31)   CODE STATUS: Full Code   No Known Allergies  Chief Complaint  Patient presents with   Acute Visit    Lab follow-up    HPI:    Past Medical History:  Diagnosis Date   Abnormal posture    Per [facility], [facility] Records [facility]    Anemia    Cognitive communication deficit    Per [facility], [facility]    Contracture of left knee    Per [facility], [facility]    Diabetes mellitus without complication (HCC)    Difficulty walking    Per [facility], [facility]    Extended spectrum beta lactamase (ESBL) resistance    Per [facility], [facility]    Gastroesophageal reflux disease    Per [facility], [facility]    Glaucoma    Gram negative sepsis (HCC)    Per [facility], [facility]    Hypercholesteremia    Hypertension    Hypokalemia    Infection, bacterial    Per [facility], [facility]    Muscle weakness (generalized)    Per [facility], [facility]    Need for assistance with personal care    Per [facility], [facility]    OAB (overactive bladder)    Per [facility], [facility]    Ocular hypertension, bilateral    Per [facility], [facility]    Osteoarthritis of right knee    Per [facility], [facility]    Paraplegia [facility])    Pyelonephritis, acute    Per [facility], [facility]    Status post total right knee replacement     Unspecified dementia without behavioral disturbance    Per [facility], [facility]    Urgency of urination    Per [facility], [facility]    Wheelchair dependence    Per Ria Bush, [facility]     Past Surgical History:  Procedure Laterality Date   ABDOMINAL HYSTERECTOMY     CATARACT EXTRACTION     COLONOSCOPY N/A 12/10/2013   Procedure: COLONOSCOPY;  Surgeon: West Bali, MD;  Location: [facility];  Service: Endoscopy;  Laterality: N/A;  8:30 AM   TOTAL KNEE ARTHROPLASTY Right 03/30/2016   Procedure: TOTAL KNEE ARTHROPLASTY;  Surgeon: Vickki Hearing, MD;  Location: [facility];  Service: Orthopedics;  Laterality: Right;    Social History   Socioeconomic History   Marital status: Divorced    Spouse name: Not on file   Number of children: Not on file   Years of education: Not on file   Highest education level: Not on file  Occupational History   Occupation: retired   Tobacco Use   Smoking status: Former    Packs/day: 0.25    Years: 3.00    Pack years: 0.75    Types: Cigarettes   Smokeless tobacco: Never  Vaping Use   Vaping Use: Never used  Substance and Sexual Activity   Alcohol use: No  Drug use: No   Sexual activity: Not Currently    Birth control/protection: Post-menopausal  Other Topics Concern   Not on file  Social History Narrative   Long term resident of [facility]    Social Determinants of Health   Financial Resource Strain: Not on file  Food Insecurity: Not on file  Transportation Needs: Not on file  Physical Activity: Not on file  Stress: Not on file  Social Connections: Not on file  Intimate Partner Violence: Not on file   Family History  Problem Relation Age of Onset   Cancer Mother    Arthritis Mother    Hypotension Father    Cancer Sister    Hypotension Sister    Colon cancer Neg Hx       VITAL SIGNS BP 129/74   Pulse  78   Temp 97.9 F (36.6 C)   Resp 16   Ht 5\' 5"  (1.651 m)   Wt 205 lb 12.8 oz (93.4 kg)   SpO2 90%   BMI 34.25 kg/m   Outpatient Encounter Medications as of 05/24/2021  Medication Sig   acetaminophen (TYLENOL) 325 MG tablet Take 650 mg by mouth 2 (two) times daily.   apixaban (ELIQUIS) 5 MG TABS tablet Take 5 mg by mouth 2 (two) times daily.   ascorbic acid (VITAMIN C) 500 MG tablet Take 500 mg by mouth daily.   aspirin EC 81 MG tablet Take 81 mg by mouth daily.   calcium citrate (CALCITRATE - DOSED IN MG ELEMENTAL CALCIUM) 950 MG tablet Take 200 mg of elemental calcium by mouth daily.   diclofenac Sodium (VOLTAREN) 1 % GEL Apply 4 g topically 3 (three) times daily as needed. Special Instructions: apply to right knee   diclofenac Sodium (VOLTAREN) 1 % GEL Apply 2 g topically 2 (two) times daily as needed. Special Instructions: apply to left index finger for arthritis pain   iron polysaccharides (NIFEREX) 150 MG capsule Take 1 capsule by mouth daily.   latanoprost (XALATAN) 0.005 % ophthalmic solution Place 1 drop into both eyes at bedtime. wait 5 minutes between multiple eye drops   NON FORMULARY Diet: NAS,   omeprazole (PRILOSEC) 20 MG capsule Take 20 mg by mouth daily. Special Instructions: *TAKE ON AN EMPTY STOMACH* FOR GERD *DO NOT CRUSH* (FORMULARY SUB FOR PANTOPRAZOLE 40MG )   oxybutynin (DITROPAN-XL) 10 MG 24 hr tablet Take 10 mg by mouth daily.   polyethylene glycol powder (GLYCOLAX/MIRALAX) 17 GM/SCOOP powder Take by mouth once.   No facility-administered encounter medications on file as of 05/24/2021.     SIGNIFICANT DIAGNOSTIC EXAMS  PREVIOUS  02-05-20: DEXA: t score: -2.279  10-20-20: KUB:  There is a nonspecific bowel gas pattern without obstruction. Moderate to marked increase feces in the colon. No renal stone is seen.  01-10-21: kub: significant fecal material identified.   01-17-21: chest x-ray: no evidence of acute cardiopulmonary disease; communicable  disease or  tuberculosis   02-18-21: ct angio of chest:  1. Negative for acute PE or thoracic aortic dissection. 2.  Aortic Atherosclerosis   02-24-21: bilateral lower extremity venous doppler:  No evidence of deep venous thrombosis in either lower extremity.   02-24-21: KUB Moderate colonic stool burden. No evidence of bowel obstruction.  TODAY  05-05-21: ct angio of chest:  1. No CT evidence for acute pulmonary embolus. 2. Bilateral lower lobe bronchial wall thickening with scattered areas of peripheral airway impaction. Imaging features suggest chronic infectious/inflammatory etiology. 3. Aortic Atherosclerosis   05-19-21: right lower  extremity venous doppler:  1. No evidence of deep vein thrombosis within the visualized veins of the right lower extremity. If there are persistent symptoms, repeat venous Doppler examination may be obtained in 3 to 5 days.  2. Right mid femoral and right distal femoral veins not well visualized due to body habitus and patient could not tolerate compression.   LABS REVIEWED PREVIOUS;    05-30-20: uric acid: 4.8 10-20-20: wbc 8.2; hgb 13.1; hct 42.8; mcv 80.0 plt 458; glucose 143; bun 17; creat 0.53; k+ 4.4; na++ 131; ca 9.1 GFR>60  liver normal albumin 3.9   01-17-21: wbc 4.3; hgb 10.3; hct 33.6; mcv 81.4 plt 385; glucose 105; bun 11; creat 0.50; k+ 3.5; na++ 134; ca 7.9; GFR >60; d-dimer: 5.93 CRP 5.3 01-24-21: d-dimer: 6.73  01-31-21: d-dimer: 5.30 02-07-21: d-dimer: 6.25 02-14-21: d-dimer: 12.74 02-24-21: d-dimer: >20 03-01-21: d-dimer: 14.18 03-10-21: d-dimer: 11.10 03-17-21; d-dimer: 10.44 03-22-21: d-dimer: 10.32  03-29-21: d-dimer: 10.97 04-11-21: d-dimer 12.12 04-25-21: d-dimer: 7.42   05-09-21: d-dimer: 5.60  05-19-21: d-dimer: 5.63  TODAY  05-24-21; wbc 6.6; hgb 8.9; hct 30.4; mcv 81.1 plt 453 ;glucose 92; bun 16; creat 0.46; k+ 3.8; na++ 137; ca 8.5; GFR >60; liver normal albumin 3.1   Review of Systems  Constitutional:  Negative for malaise/fatigue.   Respiratory:  Negative for cough and shortness of breath.   Cardiovascular:  Negative for chest pain, palpitations and leg swelling.  Gastrointestinal:  Negative for abdominal pain, constipation and heartburn.  Musculoskeletal:  Negative for back pain, joint pain and myalgias.  Skin: Negative.   Neurological:  Negative for dizziness.  Psychiatric/Behavioral:  The patient is not nervous/anxious.    Physical Exam Constitutional:      General: She is not in acute distress.    Appearance: She is well-developed. She is obese. She is not diaphoretic.  Neck:     Thyroid: No thyromegaly.  Cardiovascular:     Rate and Rhythm: Normal rate and regular rhythm.     Pulses: Normal pulses.     Heart sounds: Normal heart sounds.  Pulmonary:     Effort: Pulmonary effort is normal. No respiratory distress.     Breath sounds: Normal breath sounds.  Abdominal:     General: Bowel sounds are normal. There is no distension.     Palpations: Abdomen is soft.     Tenderness: There is no abdominal tenderness.  Musculoskeletal:     Cervical back: Neck supple.     Right lower leg: No edema.     Left lower leg: No edema.     Comments:  Is able to move all extremities History of right knee replacement with contracture      Lymphadenopathy:     Cervical: No cervical adenopathy.  Skin:    General: Skin is warm and dry.  Neurological:     Mental Status: She is alert. Mental status is at baseline.  Psychiatric:        Mood and Affect: Mood normal.      ASSESSMENT/ PLAN:  TODAY  Chronic anemia: hgb 8.9 (10.3) will guaiac stools X3; she is on eliquis due to elevated d-dimer due to covid. Will monitor her status.    Synthia Innocent NP [facility]  Contact [phone] Monday through Friday 8am- 5pm  After hours call [phone]

## 2021-05-27 ENCOUNTER — Encounter (HOSPITAL_COMMUNITY)
Admission: RE | Admit: 2021-05-27 | Discharge: 2021-05-27 | Disposition: A | Discharge status: Home / Self Care | Payer: Medicare HMO | Source: Skilled Nursing Facility | Attending: Adult Health | Admitting: Adult Health

## 2021-05-27 DIAGNOSIS — Z8616 Personal history of COVID-19: Secondary | ICD-10-CM | POA: Insufficient documentation

## 2021-05-27 DIAGNOSIS — D509 Iron deficiency anemia, unspecified: Secondary | ICD-10-CM | POA: Insufficient documentation

## 2021-05-27 DIAGNOSIS — U099 Post covid-19 condition, unspecified: Secondary | ICD-10-CM | POA: Diagnosis present

## 2021-05-27 DIAGNOSIS — E119 Type 2 diabetes mellitus without complications: Secondary | ICD-10-CM | POA: Insufficient documentation

## 2021-05-27 DIAGNOSIS — D6869 Other thrombophilia: Secondary | ICD-10-CM | POA: Diagnosis present

## 2021-05-27 LAB — OCCULT BLOOD X 1 CARD TO LAB, STOOL: Fecal Occult Bld: NEGATIVE

## 2021-06-03 ENCOUNTER — Non-Acute Institutional Stay (SKILLED_NURSING_FACILITY): Payer: Medicare HMO | Admitting: Adult Health

## 2021-06-03 ENCOUNTER — Encounter (HOSPITAL_COMMUNITY)
Admission: RE | Admit: 2021-06-03 | Discharge: 2021-06-03 | Disposition: A | Discharge status: Home / Self Care | Payer: Medicare HMO | Source: Skilled Nursing Facility | Attending: Adult Health | Admitting: Adult Health

## 2021-06-03 ENCOUNTER — Encounter: Payer: Self-pay | Admitting: Adult Health

## 2021-06-03 DIAGNOSIS — I700 Atherosclerosis of aorta: Secondary | ICD-10-CM | POA: Diagnosis not present

## 2021-06-03 DIAGNOSIS — F01B0 Vascular dementia, moderate, without behavioral disturbance, psychotic disturbance, mood disturbance, and anxiety: Secondary | ICD-10-CM

## 2021-06-03 DIAGNOSIS — D6869 Other thrombophilia: Secondary | ICD-10-CM | POA: Diagnosis not present

## 2021-06-03 DIAGNOSIS — U071 Other thrombophilia: Secondary | ICD-10-CM

## 2021-06-03 DIAGNOSIS — D509 Iron deficiency anemia, unspecified: Secondary | ICD-10-CM | POA: Diagnosis not present

## 2021-06-03 LAB — D-DIMER, QUANTITATIVE: D-Dimer, Quant: 6.31 ug/mL-FEU — ABNORMAL HIGH (ref 0.00–0.50)

## 2021-06-03 NOTE — Progress Notes (Signed)
Location:  [place] Room Number: 105-D Place of Service:  SNF (31)   CODE STATUS: Full Code  No Known Allergies  Chief Complaint  Patient presents with   Acute Visit    Care plan meeting    HPI:  We have come together for her care plan meeting. BIMS 10/15 mood 0/30. She is nonambulatory. She requires supervision with locomoion. She requires extensive assist with her adls. She is incontinent of bladder and bowel. There have been no falls. Dietary: NAS good appetite; feeds self; weight is 198.8 pounds is down 7 pounds over the past 3 months. She is presently on eliquis 5 mg twice daily for her elevated d-dimer which remains elevated. Therapy: none at this time. 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   Hypercoagulable state associated with COVID 19   Past Medical History:  Diagnosis Date   Abnormal posture    Per [facility] Records [facility]    Anemia    Cognitive communication deficit    Per [facility], [facility]    Contracture of left knee    Per [facility], [facility]    Diabetes mellitus without complication ([place])    Difficulty walking    Per [facility], [facility]    Extended spectrum beta lactamase (ESBL) resistance    Per [facility], [facility]    Gastroesophageal reflux disease    Per [facility], [facility]    Glaucoma    Gram negative sepsis ([place])    Per [facility], [facility]    Hypercholesteremia    Hypertension    Hypokalemia    Infection, bacterial    Per [facility], [facility]  Electronic [facility] Records [facility]    Muscle weakness (generalized)    Per [facility]    Need for assistance with personal care    Per [facility]    OAB (overactive bladder)    Per [facility], [facility]    Ocular hypertension, bilateral    Per [facility], [facility]    Osteoarthritis of right knee    Per [facility]    Paraplegia [facility])    Pyelonephritis, acute    Per [facility], [facility]    Status post total right knee replacement    Unspecified dementia without behavioral disturbance    Per [facility], [facility]    Urgency of urination    Per [facility], [facility]    Wheelchair dependence    Per Zadie Cleverly, [facility] Records [facility]     Past Surgical History:  Procedure Laterality Date   ABDOMINAL HYSTERECTOMY     CATARACT EXTRACTION     COLONOSCOPY N/A 12/10/2013   Procedure: COLONOSCOPY;  Surgeon: Danie Binder, MD;  [facility]: [facility];  Service: Endoscopy;  Laterality: N/A;  8:30 AM   TOTAL KNEE ARTHROPLASTY Right 03/30/2016   Procedure: TOTAL KNEE ARTHROPLASTY;  Surgeon: Carole Civil, MD;  Location: [facility] ORS;  Service: Orthopedics;  Laterality: Right;    Social History   Socioeconomic History   Marital status: Divorced    Spouse name: Not on file   Number of children: Not on file   Years of education: Not on file   Highest education level: Not on file  Occupational History   Occupation: retired   Tobacco Use   Smoking status: Former    Packs/day: 0.25    Years: 3.00    Pack years: 0.75    Types: Cigarettes   Smokeless tobacco: Never  Vaping Use   Vaping Use: Never used   Substance and Sexual Activity   Alcohol use: No   Drug use: No   Sexual activity: Not Currently    Birth control/protection: Post-menopausal  Other Topics Concern   Not on file  Social History Narrative   Long term resident of [facility]    Social Determinants of Health   Financial Resource Strain: Not on file  Food Insecurity: Not on file  Transportation Needs: Not on file  Physical Activity: Not on file  Stress: Not on file  Social Connections: Not on file  Intimate Partner Violence: Not on file   Family History  Problem Relation Age of Onset   Cancer Mother    Arthritis Mother    Hypotension Father    Cancer Sister    Hypotension Sister    Colon cancer Neg Hx       VITAL SIGNS BP 118/84   Pulse 95   Temp 98.6 F (37 C)   Resp 20   Ht 5\' 5"  (1.651 m)   Wt 198 lb 12.8 oz (90.2 kg)   SpO2 90%   BMI 33.08 kg/m   Outpatient Encounter Medications as of 06/03/2021  Medication Sig   acetaminophen (TYLENOL) 325 MG tablet Take 650 mg by mouth 2 (two) times daily.   apixaban (ELIQUIS) 5 MG TABS tablet Take 5 mg by mouth 2 (two) times daily.   ascorbic acid (VITAMIN C) 500 MG tablet Take 500 mg by mouth daily.   aspirin EC 81 MG tablet Take 81 mg by mouth daily.   calcium citrate (CALCITRATE - DOSED IN MG ELEMENTAL CALCIUM) 950 MG tablet Take 200 mg of elemental calcium by mouth daily.   diclofenac Sodium (VOLTAREN) 1 % GEL Apply 4 g topically 3 (three) times daily as needed. Special Instructions: apply to right knee   diclofenac Sodium (VOLTAREN) 1 % GEL Apply 2 g topically 2 (two) times daily as needed. Special Instructions: apply to left index finger for arthritis pain   iron polysaccharides (NIFEREX) 150 MG capsule Take 1 capsule by mouth daily.   latanoprost (XALATAN) 0.005 % ophthalmic solution Place 1 drop into both eyes at bedtime. wait 5 minutes between multiple eye drops   NON FORMULARY Diet: NAS,   omeprazole (PRILOSEC) 20 MG capsule Take 20 mg by mouth daily.  Special Instructions: *TAKE ON AN EMPTY STOMACH* FOR GERD *DO NOT CRUSH* (FORMULARY SUB FOR PANTOPRAZOLE 40MG )   oxybutynin (DITROPAN-XL) 10 MG 24 hr tablet Take 10 mg by mouth daily.   polyethylene glycol powder (GLYCOLAX/MIRALAX) 17 GM/SCOOP powder Take by mouth once.   No facility-administered encounter medications on file as of 06/03/2021.     SIGNIFICANT DIAGNOSTIC EXAMS   PREVIOUS  02-05-20: DEXA: t score: -2.279  10-20-20: KUB:  There is a nonspecific bowel gas pattern without obstruction. Moderate to marked increase feces in the colon. No renal stone is seen.  01-10-21: kub: significant fecal material  identified.   01-17-21: chest x-ray: no evidence of acute cardiopulmonary disease; communicable  disease or tuberculosis   02-18-21: ct angio of chest:  1. Negative for acute PE or thoracic aortic dissection. 2.  Aortic Atherosclerosis   02-24-21: bilateral lower extremity venous doppler:  No evidence of deep venous thrombosis in either lower extremity.   02-24-21: KUB Moderate colonic stool burden. No evidence of bowel obstruction.  05-05-21: ct angio of chest:  1. No CT evidence for acute pulmonary embolus. 2. Bilateral lower lobe bronchial wall thickening with scattered areas of peripheral airway impaction. Imaging features suggest chronic infectious/inflammatory etiology. 3. Aortic Atherosclerosis   05-19-21: right lower extremity venous doppler:  1. No evidence of deep vein thrombosis within the visualized veins of the right lower extremity. If there are persistent symptoms, repeat venous Doppler examination may be obtained in 3 to 5 days.  2. Right mid femoral and right distal femoral veins not well visualized due to body habitus and patient could not tolerate compression.  NO NEW EXAMS.    LABS REVIEWED PREVIOUS;    05-30-20: uric acid: 4.8 10-20-20: wbc 8.2; hgb 13.1; hct 42.8; mcv 80.0 plt 458; glucose 143; bun 17; creat 0.53; k+ 4.4; na++ 131; ca 9.1 GFR>60  liver  normal albumin 3.9   01-17-21: wbc 4.3; hgb 10.3; hct 33.6; mcv 81.4 plt 385; glucose 105; bun 11; creat 0.50; k+ 3.5; na++ 134; ca 7.9; GFR >60; d-dimer: 5.93 CRP 5.3 01-24-21: d-dimer: 6.73  01-31-21: d-dimer: 5.30 02-07-21: d-dimer: 6.25 02-14-21: d-dimer: 12.74 02-24-21: d-dimer: >20 03-01-21: d-dimer: 14.18 03-10-21: d-dimer: 11.10 03-17-21; d-dimer: 10.44 03-22-21: d-dimer: 10.32  03-29-21: d-dimer: 10.97 04-11-21: d-dimer 12.12 04-25-21: d-dimer: 7.42   05-09-21: d-dimer: 5.60  05-19-21: d-dimer: 5.63 05-24-21; wbc 6.6; hgb 8.9; hct 30.4; mcv 81.1 plt 453 ;glucose 92; bun 16; creat 0.46; k+ 3.8; na++ 137; ca 8.5; GFR >60; liver normal albumin 3.1   TODAY  06-03-21: d-dimer: 6.31  Review of Systems  Constitutional:  Negative for malaise/fatigue.  Respiratory:  Negative for cough and shortness of breath.   Cardiovascular:  Negative for chest pain, palpitations and leg swelling.  Gastrointestinal:  Negative for abdominal pain, constipation and heartburn.  Musculoskeletal:  Negative for back pain, joint pain and myalgias.  Skin: Negative.   Neurological:  Negative for dizziness.  Psychiatric/Behavioral:  The patient is not nervous/anxious.     Physical Exam Constitutional:      General: She is not in acute distress.    Appearance: She is well-developed. She is not diaphoretic.  Neck:     Thyroid: No thyromegaly.  Cardiovascular:     Rate and Rhythm: Normal rate and regular rhythm.     Pulses: Normal pulses.     Heart sounds: Normal heart sounds.  Pulmonary:     Effort: Pulmonary effort is normal. No respiratory distress.     Breath sounds: Normal breath sounds.  Abdominal:     General: Bowel sounds are normal. There is no distension.     Palpations: Abdomen is soft.     Tenderness: There is no abdominal tenderness.  Musculoskeletal:     Cervical back: Neck supple.     Right lower leg: No edema.     Left lower leg: No edema.  Lymphadenopathy:     Cervical: No cervical  adenopathy.  Skin:    General: Skin is warm and dry.  Neurological:     General: No focal deficit present.     Mental Status: She is alert.  Psychiatric:        Mood and Affect: Mood normal.     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  Hypercoagulable state associated with COVID 19    Will continue current medications Will check d-dimer on 06-16-21 Will continue to monitor her status.   Time spent with patient: 40 minutes: medications; elevated d-dimer; plan of care.    Synthia Innocent NP [facility]  Contact [phone] Monday through Friday 8am- 5pm  After hours call [phone]

## 2021-06-15 ENCOUNTER — Encounter: Payer: Self-pay | Admitting: Adult Health

## 2021-06-15 ENCOUNTER — Non-Acute Institutional Stay (SKILLED_NURSING_FACILITY): Payer: Medicare HMO | Admitting: Adult Health

## 2021-06-15 DIAGNOSIS — K219 Gastro-esophageal reflux disease without esophagitis: Secondary | ICD-10-CM | POA: Diagnosis not present

## 2021-06-15 DIAGNOSIS — N3281 Overactive bladder: Secondary | ICD-10-CM | POA: Diagnosis not present

## 2021-06-15 DIAGNOSIS — U071 COVID-19: Secondary | ICD-10-CM | POA: Diagnosis not present

## 2021-06-15 DIAGNOSIS — D6869 Other thrombophilia: Secondary | ICD-10-CM | POA: Diagnosis not present

## 2021-06-15 NOTE — Progress Notes (Signed)
Location:  [place] Room Number: 105-D Place of Service:  SNF (31)   CODE STATUS: Full Code   No Known Allergies  Chief Complaint  Patient presents with   Medical Management of Chronic Issues         Hypercoagulable state due to COVID 19: GERD without esophagitis:  Urinary urgency:    HPI:  She is a 86 year old long term resident of this facility being seen for the management of her chronic illnesses: Hypercoagulable state due to COVID 19: GERD without esophagitis:  Urinary urgency. There are no reports of uncontrolled pain. Her weight is stable. No changes in appetite; no reports of anxiety or agitation.   Past Medical History:  Diagnosis Date   Abnormal posture    Per [facility] Records [facility]    Anemia    Cognitive communication deficit    Per [facility], [facility]    Contracture of left knee    Per [facility]    Diabetes mellitus without complication ([place])    Difficulty walking    Per [facility], [facility] Records [facility]    Extended spectrum beta lactamase (ESBL) resistance    Per [facility]    Gastroesophageal reflux disease    Per [facility], [facility] Records [facility]    Glaucoma    Gram negative sepsis ([place])    Per [facility], [facility]    Hypercholesteremia    Hypertension    Hypokalemia    Infection, bacterial    Per [facility], [facility] Records [facility]    Muscle weakness (generalized)    Per [facility], [facility]    Need for assistance with personal care    Per [facility]    OAB (overactive bladder)    Per [facility], [facility]  [facility] Records [facility]    Ocular hypertension, bilateral    Per [facility], [facility]    Osteoarthritis of right knee    Per [facility], [facility]    Paraplegia [facility])    Pyelonephritis, acute    Per [facility], [facility] Records [facility]    Status post total right knee replacement    Unspecified dementia without behavioral disturbance    Per [facility], [facility]    Urgency of urination    Per [facility], [facility]    Wheelchair dependence    Per Zadie Cleverly, [facility] Records [facility]     Past Surgical History:  Procedure Laterality Date   ABDOMINAL HYSTERECTOMY     CATARACT EXTRACTION     COLONOSCOPY N/A 12/10/2013   Procedure: COLONOSCOPY;  Surgeon: Danie Binder, MD;  [facility];  Service: Endoscopy;  Laterality: N/A;  8:30 AM   TOTAL KNEE ARTHROPLASTY Right 03/30/2016   Procedure: TOTAL KNEE ARTHROPLASTY;  Surgeon: Carole Civil, MD;  Location: [facility];  Service: Orthopedics;  Laterality: Right;    Social History   Socioeconomic History   Marital status: Divorced    Spouse name: Not on file   Number of children: Not on file   Years of education: Not on  file   Highest education level: Not on file  Occupational History   Occupation: retired   Tobacco Use   Smoking status: Former    Packs/day: 0.25    Years: 3.00    Pack years: 0.75    Types: Cigarettes   Smokeless tobacco: Never  Vaping Use   Vaping Use: Never used  Substance and Sexual Activity   Alcohol use: No   Drug use: No   Sexual activity: Not Currently    Birth control/protection: Post-menopausal  Other Topics Concern   Not on file  Social History Narrative   Long term resident of [facility]    Social Determinants of Health   Financial Resource Strain: Not on file   Food Insecurity: Not on file  Transportation Needs: Not on file  Physical Activity: Not on file  Stress: Not on file  Social Connections: Not on file  Intimate Partner Violence: Not on file   Family History  Problem Relation Age of Onset   Cancer Mother    Arthritis Mother    Hypotension Father    Cancer Sister    Hypotension Sister    Colon cancer Neg Hx       VITAL SIGNS BP 125/84   Pulse 100   Temp (!) 96.6 F (35.9 C)   Resp 20   Ht 5\' 5"  (1.651 m)   Wt 198 lb 12.8 oz (90.2 kg)   SpO2 90%   BMI 33.08 kg/m   Outpatient Encounter Medications as of 06/15/2021  Medication Sig   acetaminophen (TYLENOL) 325 MG tablet Take 650 mg by mouth 2 (two) times daily.   apixaban (ELIQUIS) 5 MG TABS tablet Take 5 mg by mouth 2 (two) times daily.   ascorbic acid (VITAMIN C) 500 MG tablet Take 500 mg by mouth daily.   aspirin EC 81 MG tablet Take 81 mg by mouth daily.   calcium citrate (CALCITRATE - DOSED IN MG ELEMENTAL CALCIUM) 950 MG tablet Take 200 mg of elemental calcium by mouth daily.   diclofenac Sodium (VOLTAREN) 1 % GEL Apply 4 g topically 3 (three) times daily as needed. Special Instructions: apply to right knee   diclofenac Sodium (VOLTAREN) 1 % GEL Apply 2 g topically 2 (two) times daily as needed. Special Instructions: apply to left index finger for arthritis pain   iron polysaccharides (NIFEREX) 150 MG capsule Take 1 capsule by mouth daily.   latanoprost (XALATAN) 0.005 % ophthalmic solution Place 1 drop into both eyes at bedtime. wait 5 minutes between multiple eye drops   NON FORMULARY Diet: NAS,   omeprazole (PRILOSEC) 20 MG capsule Take 20 mg by mouth daily. Special Instructions: *TAKE ON AN EMPTY STOMACH* FOR GERD *DO NOT CRUSH* (FORMULARY SUB FOR PANTOPRAZOLE 40MG )   oxybutynin (DITROPAN-XL) 10 MG 24 hr tablet Take 10 mg by mouth daily.   polyethylene glycol powder (GLYCOLAX/MIRALAX) 17 GM/SCOOP powder Take by mouth once.   No facility-administered encounter  medications on file as of 06/15/2021.     SIGNIFICANT DIAGNOSTIC EXAMS   PREVIOUS  02-05-20: DEXA: t score: -2.279  10-20-20: KUB:  There is a nonspecific bowel gas pattern without obstruction. Moderate to marked increase feces in the colon. No renal stone is seen.  01-10-21: kub: significant fecal material identified.   01-17-21: chest x-ray: no evidence of acute cardiopulmonary disease; communicable  disease or tuberculosis   02-18-21: ct angio of chest:  1. Negative for acute PE or thoracic aortic dissection. 2.  Aortic Atherosclerosis   02-24-21:  bilateral lower extremity venous doppler:  No evidence of deep venous thrombosis in either lower extremity.   02-24-21: KUB Moderate colonic stool burden. No evidence of bowel obstruction.  05-05-21: ct angio of chest:  1. No CT evidence for acute pulmonary embolus. 2. Bilateral lower lobe bronchial wall thickening with scattered areas of peripheral airway impaction. Imaging features suggest chronic infectious/inflammatory etiology. 3. Aortic Atherosclerosis   05-19-21: right lower extremity venous doppler:  1. No evidence of deep vein thrombosis within the visualized veins of the right lower extremity. If there are persistent symptoms, repeat venous Doppler examination may be obtained in 3 to 5 days.  2. Right mid femoral and right distal femoral veins not well visualized due to body habitus and patient could not tolerate compression.  NO NEW EXAMS.    LABS REVIEWED PREVIOUS;    05-30-20: uric acid: 4.8 10-20-20: wbc 8.2; hgb 13.1; hct 42.8; mcv 80.0 plt 458; glucose 143; bun 17; creat 0.53; k+ 4.4; na++ 131; ca 9.1 GFR>60  liver normal albumin 3.9   01-17-21: wbc 4.3; hgb 10.3; hct 33.6; mcv 81.4 plt 385; glucose 105; bun 11; creat 0.50; k+ 3.5; na++ 134; ca 7.9; GFR >60; d-dimer: 5.93 CRP 5.3 01-24-21: d-dimer: 6.73  01-31-21: d-dimer: 5.30 02-07-21: d-dimer: 6.25 02-14-21: d-dimer: 12.74 02-24-21: d-dimer: >20 03-01-21: d-dimer:  14.18 03-10-21: d-dimer: 11.10 03-17-21; d-dimer: 10.44 03-22-21: d-dimer: 10.32  03-29-21: d-dimer: 10.97 04-11-21: d-dimer 12.12 04-25-21: d-dimer: 7.42   05-09-21: d-dimer: 5.60  05-19-21: d-dimer: 5.63 05-24-21; wbc 6.6; hgb 8.9; hct 30.4; mcv 81.1 plt 453 ;glucose 92; bun 16; creat 0.46; k+ 3.8; na++ 137; ca 8.5; GFR >60; liver normal albumin 3.1   TODAY  05-27-21: guaiac negative 06-03-21: d-dimer: 6.31     Review of Systems  Constitutional:  Negative for malaise/fatigue.  Respiratory:  Negative for cough and shortness of breath.   Cardiovascular:  Negative for chest pain, palpitations and leg swelling.  Gastrointestinal:  Negative for abdominal pain, constipation and heartburn.  Musculoskeletal:  Negative for back pain, joint pain and myalgias.  Skin: Negative.   Neurological:  Negative for dizziness.  Psychiatric/Behavioral:  The patient is not nervous/anxious.    Physical Exam Constitutional:      General: She is not in acute distress.    Appearance: She is well-developed. She is obese. She is not diaphoretic.  Neck:     Thyroid: No thyromegaly.  Cardiovascular:     Rate and Rhythm: Normal rate and regular rhythm.     Heart sounds: Normal heart sounds.  Pulmonary:     Effort: Pulmonary effort is normal. No respiratory distress.     Breath sounds: Normal breath sounds.  Abdominal:     General: Bowel sounds are normal. There is no distension.     Palpations: Abdomen is soft.     Tenderness: There is no abdominal tenderness.  Musculoskeletal:     Cervical back: Neck supple.     Right lower leg: No edema.     Left lower leg: No edema.     Comments: Is able to move all extremities History of right knee replacement with contracture     Lymphadenopathy:     Cervical: No cervical adenopathy.  Skin:    General: Skin is warm and dry.  Neurological:     Mental Status: She is alert. Mental status is at baseline.  Psychiatric:        Mood and Affect: Mood normal.       ASSESSMENT/ PLAN:  TODAY;  Hypercoagulable state due to COVID 19: d-dimer is 6.31 is on eliquis 5 mg twice daily. Is off lovenox therapy   2. GERD without esophagitis: is stable will continue protonix 40 mg daily  3. Urinary urgency: is stable will continue ditropan xl 10 mg daily   PREVIOUS   4. Vascular dementia without behavioral disturbance: is stable weight is 198 pounds; will monitor   5. Aortic atherosclerosis (ct 09-03-19) will monitor   6. Hypokalemia: is stable  4.4 will monitor  7. Increased intraocular pressure bilateral is stable will continue xalatan to both eyes.   8. Primary osteoarthritis right knee; left index finger: is stable will continue tylenol 650 mg three times daily voltaren gel 4 gm to right knee three times daily and left index finger 2 gm twice daily   9. Essential hypertension: is stable b/p 125/84 will continue asa 81 mg daily   10. Chronic anemia: is stable hgb 8.9 will continue ferrex daily   Will check cbc; d-dimer; hgb a1c tsh   Synthia Innocent NP [facility]  Contact [phone] Monday through Friday 8am- 5pm  After hours call [phone]

## 2021-06-20 ENCOUNTER — Encounter (HOSPITAL_COMMUNITY)
Admission: RE | Admit: 2021-06-20 | Discharge: 2021-06-20 | Disposition: A | Discharge status: Home / Self Care | Payer: Medicare HMO | Source: Skilled Nursing Facility | Attending: Adult Health | Admitting: Adult Health

## 2021-06-20 ENCOUNTER — Non-Acute Institutional Stay (SKILLED_NURSING_FACILITY): Payer: Medicare HMO | Admitting: Adult Health

## 2021-06-20 ENCOUNTER — Encounter: Payer: Self-pay | Admitting: Adult Health

## 2021-06-20 DIAGNOSIS — U071 COVID-19: Secondary | ICD-10-CM

## 2021-06-20 DIAGNOSIS — D509 Iron deficiency anemia, unspecified: Secondary | ICD-10-CM | POA: Diagnosis not present

## 2021-06-20 DIAGNOSIS — D6869 Other thrombophilia: Secondary | ICD-10-CM

## 2021-06-20 LAB — TSH: TSH: 1.144 u[IU]/mL (ref 0.350–4.500)

## 2021-06-20 LAB — CBC
HCT: 30.8 % — ABNORMAL LOW (ref 36.0–46.0)
Hemoglobin: 9.3 g/dL — ABNORMAL LOW (ref 12.0–15.0)
MCH: 23.8 pg — ABNORMAL LOW (ref 26.0–34.0)
MCHC: 30.2 g/dL (ref 30.0–36.0)
MCV: 79 fL — ABNORMAL LOW (ref 80.0–100.0)
Platelets: 431 10*3/uL — ABNORMAL HIGH (ref 150–400)
RBC: 3.9 MIL/uL (ref 3.87–5.11)
RDW: 15.9 % — ABNORMAL HIGH (ref 11.5–15.5)
WBC: 6.3 10*3/uL (ref 4.0–10.5)
nRBC: 0 % (ref 0.0–0.2)

## 2021-06-20 LAB — HEMOGLOBIN A1C
Hgb A1c MFr Bld: 6 % — ABNORMAL HIGH (ref 4.8–5.6)
Mean Plasma Glucose: 126 mg/dL

## 2021-06-20 LAB — D-DIMER, QUANTITATIVE: D-Dimer, Quant: 7.14 ug/mL-FEU — ABNORMAL HIGH (ref 0.00–0.50)

## 2021-06-20 NOTE — Progress Notes (Signed)
Location:  [facility] Room Number: 105-D Place of Service:  SNF (31)   CODE STATUS: Full Code  No Known Allergies  Chief Complaint  Patient presents with   Acute Visit    Follow-up labs    HPI:  Her d-dimer is 7.14; which is slightly worse than 6.31. she is presently taking eliquis 5 mg twice daily. There are no reports of bleeding present. There are no reports of cough; no shortness of breath; no reports of worsening lower extremity edema.   Past Medical History:  Diagnosis Date   Abnormal posture    Per [facility], [facility] Records [facility]    Anemia    Cognitive communication deficit    Per [facility], [facility]    Contracture of left knee    Per [facility], [facility]    Diabetes mellitus without complication (HCC)    Difficulty walking    Per [facility], [facility]    Extended spectrum beta lactamase (ESBL) resistance    Per [facility], [facility]    Gastroesophageal reflux disease    Per [facility], [facility]    Glaucoma    Gram negative sepsis (HCC)    Per [facility], [facility]    Hypercholesteremia    Hypertension    Hypokalemia    Infection, bacterial    Per [employer], [facility]    Muscle weakness (generalized)    Per [facility], [facility]    Need for assistance with personal care    Per [facility], [facility]    OAB (overactive bladder)    Per [facility], [facility]    Ocular hypertension, bilateral    Per [facility], [facility]    Osteoarthritis of right  knee    Per [facility], [facility]    Paraplegia [facility])    Pyelonephritis, acute    Per [facility], [facility]    Status post total right knee replacement    Unspecified dementia without behavioral disturbance    Per [facility], [facility]    Urgency of urination    Per [facility], [facility]    Wheelchair dependence    Per Ria Bush, [facility]     Past Surgical History:  Procedure Laterality Date   ABDOMINAL HYSTERECTOMY     CATARACT EXTRACTION     COLONOSCOPY N/A 12/10/2013   Procedure: COLONOSCOPY;  Surgeon: West Bali, MD;  Location: [facility];  Service: Endoscopy;  Laterality: N/A;  8:30 AM   TOTAL KNEE ARTHROPLASTY Right 03/30/2016   Procedure: TOTAL KNEE ARTHROPLASTY;  Surgeon: Vickki Hearing, MD;  Location: [facility] ORS;  Service: Orthopedics;  Laterality: Right;    Social History   Socioeconomic History   Marital status: Divorced    Spouse name: Not on file   Number of children: Not on file   Years of education: Not on file   Highest education level: Not on file  Occupational History   Occupation: retired   Tobacco Use   Smoking status: Former    Packs/day: 0.25  Years: 3.00    Pack years: 0.75    Types: Cigarettes   Smokeless tobacco: Never  Vaping Use   Vaping Use: Never used  Substance and Sexual Activity   Alcohol use: No   Drug use: No   Sexual activity: Not Currently    Birth control/protection: Post-menopausal  Other Topics Concern   Not on file  Social History Narrative   Long term resident of [facility]    Social Determinants of Health   Financial Resource Strain: Not on file  Food Insecurity: Not on file  Transportation Needs: Not on file  Physical Activity: Not on file  Stress: Not on file  Social Connections: Not on file  Intimate  Partner Violence: Not on file   Family History  Problem Relation Age of Onset   Cancer Mother    Arthritis Mother    Hypotension Father    Cancer Sister    Hypotension Sister    Colon cancer Neg Hx       VITAL SIGNS BP 116/73   Pulse 98   Temp 97.7 F (36.5 C)   Resp 20   Ht 5\' 5"  (1.651 m)   Wt 198 lb 12.8 oz (90.2 kg)   SpO2 90%   BMI 33.08 kg/m   Outpatient Encounter Medications as of 06/20/2021  Medication Sig   acetaminophen (TYLENOL) 325 MG tablet Take 650 mg by mouth 2 (two) times daily.   apixaban (ELIQUIS) 5 MG TABS tablet Take 5 mg by mouth 2 (two) times daily.   ascorbic acid (VITAMIN C) 500 MG tablet Take 500 mg by mouth daily.   aspirin EC 81 MG tablet Take 81 mg by mouth daily.   calcium citrate (CALCITRATE - DOSED IN MG ELEMENTAL CALCIUM) 950 MG tablet Take 200 mg of elemental calcium by mouth daily.   diclofenac Sodium (VOLTAREN) 1 % GEL Apply 4 g topically 3 (three) times daily as needed. Special Instructions: apply to right knee   diclofenac Sodium (VOLTAREN) 1 % GEL Apply 2 g topically 2 (two) times daily as needed. Special Instructions: apply to left index finger for arthritis pain   iron polysaccharides (NIFEREX) 150 MG capsule Take 1 capsule by mouth daily.   latanoprost (XALATAN) 0.005 % ophthalmic solution Place 1 drop into both eyes at bedtime. wait 5 minutes between multiple eye drops   NON FORMULARY Diet: NAS,   omeprazole (PRILOSEC) 20 MG capsule Take 20 mg by mouth daily. Special Instructions: *TAKE ON AN EMPTY STOMACH* FOR GERD *DO NOT CRUSH* (FORMULARY SUB FOR PANTOPRAZOLE 40MG )   oxybutynin (DITROPAN-XL) 10 MG 24 hr tablet Take 10 mg by mouth daily.   polyethylene glycol powder (GLYCOLAX/MIRALAX) 17 GM/SCOOP powder Take by mouth once.   No facility-administered encounter medications on file as of 06/20/2021.     SIGNIFICANT DIAGNOSTIC EXAMS  PREVIOUS  02-05-20: DEXA: t score: -2.279  10-20-20: KUB:  There is a nonspecific bowel gas  pattern without obstruction. Moderate to marked increase feces in the colon. No renal stone is seen.  01-10-21: kub: significant fecal material identified.   01-17-21: chest x-ray: no evidence of acute cardiopulmonary disease; communicable  disease or tuberculosis   02-18-21: ct angio of chest:  1. Negative for acute PE or thoracic aortic dissection. 2.  Aortic Atherosclerosis   02-24-21: bilateral lower extremity venous doppler:  No evidence of deep venous thrombosis in either lower extremity.   02-24-21: KUB Moderate colonic stool burden. No evidence of bowel obstruction.  05-05-21: ct angio of chest:  1. No CT evidence for acute pulmonary embolus. 2. Bilateral lower lobe bronchial wall thickening with scattered areas of peripheral airway impaction. Imaging features suggest chronic infectious/inflammatory etiology. 3. Aortic Atherosclerosis   05-19-21: right lower extremity venous doppler:  1. No evidence of deep vein thrombosis within the visualized veins of the right lower extremity. If there are persistent symptoms, repeat venous Doppler examination may be obtained in 3 to 5 days.  2. Right mid femoral and right distal femoral veins not well visualized due to body habitus and patient could not tolerate compression.  NO NEW EXAMS.    LABS REVIEWED PREVIOUS;    05-30-20: uric acid: 4.8 10-20-20: wbc 8.2; hgb 13.1; hct 42.8; mcv 80.0 plt 458; glucose 143; bun 17; creat 0.53; k+ 4.4; na++ 131; ca 9.1 GFR>60  liver normal albumin 3.9   01-17-21: wbc 4.3; hgb 10.3; hct 33.6; mcv 81.4 plt 385; glucose 105; bun 11; creat 0.50; k+ 3.5; na++ 134; ca 7.9; GFR >60; d-dimer: 5.93 CRP 5.3 01-24-21: d-dimer: 6.73  01-31-21: d-dimer: 5.30 02-07-21: d-dimer: 6.25 02-14-21: d-dimer: 12.74 02-24-21: d-dimer: >20 03-01-21: d-dimer: 14.18 03-10-21: d-dimer: 11.10 03-17-21; d-dimer: 10.44 03-22-21: d-dimer: 10.32  03-29-21: d-dimer: 10.97 04-11-21: d-dimer 12.12 04-25-21: d-dimer: 7.42   05-09-21: d-dimer: 5.60   05-19-21: d-dimer: 5.63 05-24-21; wbc 6.6; hgb 8.9; hct 30.4; mcv 81.1 plt 453 ;glucose 92; bun 16; creat 0.46; k+ 3.8; na++ 137; ca 8.5; GFR >60; liver normal albumin 3.1   TODAY  05-27-21: guaiac negative 06-03-21: d-dimer: 6.31  06-20-21: d-dimer 7.14  Review of Systems  Constitutional:  Negative for malaise/fatigue.  Respiratory:  Negative for cough and shortness of breath.   Cardiovascular:  Negative for chest pain, palpitations and leg swelling.  Gastrointestinal:  Negative for abdominal pain, constipation and heartburn.  Musculoskeletal:  Negative for back pain, joint pain and myalgias.  Skin: Negative.   Neurological:  Negative for dizziness.  Psychiatric/Behavioral:  The patient is not nervous/anxious.    Physical Exam Constitutional:      General: She is not in acute distress.    Appearance: She is well-developed. She is obese. She is not diaphoretic.  Neck:     Thyroid: No thyromegaly.  Cardiovascular:     Rate and Rhythm: Normal rate and regular rhythm.     Pulses: Normal pulses.     Heart sounds: Normal heart sounds.  Pulmonary:     Effort: Pulmonary effort is normal. No respiratory distress.     Breath sounds: Normal breath sounds.  Abdominal:     General: Bowel sounds are normal. There is no distension.     Palpations: Abdomen is soft.     Tenderness: There is no abdominal tenderness.  Musculoskeletal:     Cervical back: Neck supple.     Right lower leg: No edema.     Left lower leg: No edema.     Comments:  Is able to move all extremities History of right knee replacement with contracture      Lymphadenopathy:     Cervical: No cervical adenopathy.  Skin:    General: Skin is warm and dry.  Neurological:     Mental Status: She is alert. Mental status is at baseline.  Psychiatric:        Mood and Affect: Mood normal.      ASSESSMENT/ PLAN:  TODAY  Hypercoagulable state due to COVID 19: is slightly worse; will continue eliquis 5 mg twice daily and  will repeat d-dimer on 07-04-21  Ok Edwards NP [facility]  Contact [phone] Monday through Friday 8am- 5pm  After hours call [phone]

## 2021-06-24 ENCOUNTER — Encounter (HOSPITAL_COMMUNITY)
Admission: RE | Admit: 2021-06-24 | Discharge: 2021-06-24 | Disposition: A | Discharge status: Home / Self Care | Payer: Medicare HMO | Source: Skilled Nursing Facility | Attending: Adult Health | Admitting: Adult Health

## 2021-06-24 DIAGNOSIS — D509 Iron deficiency anemia, unspecified: Secondary | ICD-10-CM | POA: Diagnosis not present

## 2021-06-24 LAB — OCCULT BLOOD X 1 CARD TO LAB, STOOL: Fecal Occult Bld: POSITIVE — AB

## 2021-06-29 ENCOUNTER — Other Ambulatory Visit (HOSPITAL_COMMUNITY)
Admission: RE | Admit: 2021-06-29 | Discharge: 2021-06-29 | Disposition: A | Discharge status: Home / Self Care | Payer: Medicare HMO | Source: Skilled Nursing Facility | Attending: Adult Health | Admitting: Adult Health

## 2021-06-29 DIAGNOSIS — D509 Iron deficiency anemia, unspecified: Secondary | ICD-10-CM | POA: Diagnosis present

## 2021-06-29 LAB — OCCULT BLOOD X 1 CARD TO LAB, STOOL: Fecal Occult Bld: POSITIVE — AB

## 2021-07-04 ENCOUNTER — Other Ambulatory Visit (HOSPITAL_COMMUNITY)
Admission: RE | Admit: 2021-07-04 | Discharge: 2021-07-04 | Disposition: A | Discharge status: Home / Self Care | Payer: Medicare HMO | Source: Skilled Nursing Facility | Attending: Adult Health | Admitting: Adult Health

## 2021-07-04 ENCOUNTER — Encounter: Payer: Self-pay | Admitting: Adult Health

## 2021-07-04 ENCOUNTER — Non-Acute Institutional Stay (SKILLED_NURSING_FACILITY): Payer: Medicare HMO | Admitting: Adult Health

## 2021-07-04 DIAGNOSIS — Z130 Encounter for screening for diseases of the blood and blood-forming organs and certain disorders involving the immune mechanism: Secondary | ICD-10-CM | POA: Insufficient documentation

## 2021-07-04 DIAGNOSIS — U071 COVID-19: Secondary | ICD-10-CM | POA: Diagnosis not present

## 2021-07-04 DIAGNOSIS — D6869 Other thrombophilia: Secondary | ICD-10-CM

## 2021-07-04 DIAGNOSIS — Z0189 Encounter for other specified special examinations: Secondary | ICD-10-CM | POA: Diagnosis not present

## 2021-07-04 DIAGNOSIS — R195 Other fecal abnormalities: Secondary | ICD-10-CM

## 2021-07-04 DIAGNOSIS — Z8616 Personal history of COVID-19: Secondary | ICD-10-CM | POA: Insufficient documentation

## 2021-07-04 LAB — D-DIMER, QUANTITATIVE: D-Dimer, Quant: 4.35 ug/mL-FEU — ABNORMAL HIGH (ref 0.00–0.50)

## 2021-07-04 NOTE — Progress Notes (Signed)
Location:  [facility] Room Number: 105-D Place of Service:  SNF (31)   CODE STATUS: Full Code  No Known Allergies  Chief Complaint  Patient presents with   Acute Visit    Lab follow-up    HPI:  Her d-dimer has lowered at 4.35. she continues on eliquis   Past Medical History:  Diagnosis Date   Abnormal posture    Per [employer], [facility] Records [facility]    Anemia    Cognitive communication deficit    Per [facility], [facility]    Contracture of left knee    Per [facility], [facility]    Diabetes mellitus without complication (HCC)    Difficulty walking    Per [facility], [facility]    Extended spectrum beta lactamase (ESBL) resistance    Per [facility], [facility]    Gastroesophageal reflux disease    Per [facility], [facility]    Glaucoma    Gram negative sepsis (HCC)    Per [facility], [facility]    Hypercholesteremia    Hypertension    Hypokalemia    Infection, bacterial    Per [employer], [facility]    Muscle weakness (generalized)    Per [facility], [facility]    Need for assistance with personal care    Per [facility], [facility]    OAB (overactive bladder)    Per [facility], [facility]    Ocular hypertension, bilateral    Per [facility], [facility]    Osteoarthritis of right knee    Per [facility], [facility]    Paraplegia [facility])    Pyelonephritis, acute    Per [facility], [facility]     Status post total right knee replacement    Unspecified dementia without behavioral disturbance    Per [facility], [facility]    Urgency of urination    Per [facility], [facility]    Wheelchair dependence    Per Ria Bush, [facility]     Past Surgical History:  Procedure Laterality Date   ABDOMINAL HYSTERECTOMY     CATARACT EXTRACTION     COLONOSCOPY N/A 12/10/2013   Procedure: COLONOSCOPY;  Surgeon: West Bali, MD;  Location: [facility];  Service: Endoscopy;  Laterality: N/A;  8:30 AM   TOTAL KNEE ARTHROPLASTY Right 03/30/2016   Procedure: TOTAL KNEE ARTHROPLASTY;  Surgeon: Vickki Hearing, MD;  Location: [facility];  Service: Orthopedics;  Laterality: Right;    Social History   Socioeconomic History   Marital status: Divorced    Spouse name: Not on file   Number of children: Not on file   Years of education: Not on file   Highest education level: Not on file  Occupational History   Occupation: retired   Tobacco Use   Smoking status: Former    Packs/day: 0.25    Years: 3.00    Pack years: 0.75    Types: Cigarettes   Smokeless tobacco: Never  Vaping Use   Vaping Use: Never used  Substance and  Sexual Activity   Alcohol use: No   Drug use: No   Sexual activity: Not Currently    Birth control/protection: Post-menopausal  Other Topics Concern   Not on file  Social History Narrative   Long term resident of [facility]    Social Determinants of Health   Financial Resource Strain: Not on file  Food Insecurity: Not on file  Transportation Needs: Not on file  Physical Activity: Not on file  Stress: Not on file  Social Connections: Not on file  Intimate Partner Violence: Not on file   Family History  Problem Relation Age of Onset   Cancer Mother    Arthritis Mother    Hypotension Father    Cancer Sister    Hypotension Sister    Colon cancer  Neg Hx       VITAL SIGNS BP (!) 116/58   Pulse 87   Temp 97.6 F (36.4 C)   Resp 20   Ht 5\' 5"  (1.651 m)   Wt 198 lb 12.8 oz (90.2 kg)   SpO2 90%   BMI 33.08 kg/m   Outpatient Encounter Medications as of 07/04/2021  Medication Sig   acetaminophen (TYLENOL) 325 MG tablet Take 650 mg by mouth 2 (two) times daily.   apixaban (ELIQUIS) 5 MG TABS tablet Take 5 mg by mouth 2 (two) times daily.   ascorbic acid (VITAMIN C) 500 MG tablet Take 500 mg by mouth daily.   aspirin EC 81 MG tablet Take 81 mg by mouth daily.   calcium citrate (CALCITRATE - DOSED IN MG ELEMENTAL CALCIUM) 950 MG tablet Take 200 mg of elemental calcium by mouth daily.   diclofenac Sodium (VOLTAREN) 1 % GEL Apply 4 g topically 3 (three) times daily as needed. Special Instructions: apply to right knee   diclofenac Sodium (VOLTAREN) 1 % GEL Apply 2 g topically 2 (two) times daily as needed. Special Instructions: apply to left index finger for arthritis pain   iron polysaccharides (NIFEREX) 150 MG capsule Take 1 capsule by mouth daily.   latanoprost (XALATAN) 0.005 % ophthalmic solution Place 1 drop into both eyes at bedtime. wait 5 minutes between multiple eye drops   NON FORMULARY Diet: NAS,   omeprazole (PRILOSEC) 20 MG capsule Take 20 mg by mouth daily. Special Instructions: *TAKE ON AN EMPTY STOMACH* FOR GERD *DO NOT CRUSH* (FORMULARY SUB FOR PANTOPRAZOLE 40MG )   oxybutynin (DITROPAN-XL) 10 MG 24 hr tablet Take 10 mg by mouth daily.   polyethylene glycol powder (GLYCOLAX/MIRALAX) 17 GM/SCOOP powder Take by mouth once.   No facility-administered encounter medications on file as of 07/04/2021.     SIGNIFICANT DIAGNOSTIC EXAMS  PREVIOUS  02-05-20: DEXA: t score: -2.279  10-20-20: KUB:  There is a nonspecific bowel gas pattern without obstruction. Moderate to marked increase feces in the colon. No renal stone is seen.  01-10-21: kub: significant fecal material identified.   01-17-21: chest x-ray: no evidence of  acute cardiopulmonary disease; communicable  disease or tuberculosis   02-18-21: ct angio of chest:  1. Negative for acute PE or thoracic aortic dissection. 2.  Aortic Atherosclerosis   02-24-21: bilateral lower extremity venous doppler:  No evidence of deep venous thrombosis in either lower extremity.   02-24-21: KUB Moderate colonic stool burden. No evidence of bowel obstruction.  05-05-21: ct angio of chest:  1. No CT evidence for acute pulmonary embolus. 2. Bilateral lower lobe bronchial wall thickening with scattered areas of peripheral airway impaction. Imaging features suggest chronic infectious/inflammatory etiology.  3. Aortic Atherosclerosis   05-19-21: right lower extremity venous doppler:  1. No evidence of deep vein thrombosis within the visualized veins of the right lower extremity. If there are persistent symptoms, repeat venous Doppler examination may be obtained in 3 to 5 days.  2. Right mid femoral and right distal femoral veins not well visualized due to body habitus and patient could not tolerate compression.  NO NEW EXAMS.    LABS REVIEWED PREVIOUS;    10-20-20: wbc 8.2; hgb 13.1; hct 42.8; mcv 80.0 plt 458; glucose 143; bun 17; creat 0.53; k+ 4.4; na++ 131; ca 9.1 GFR>60  liver normal albumin 3.9   01-17-21: wbc 4.3; hgb 10.3; hct 33.6; mcv 81.4 plt 385; glucose 105; bun 11; creat 0.50; k+ 3.5; na++ 134; ca 7.9; GFR >60; d-dimer: 5.93 CRP 5.3 01-24-21: d-dimer: 6.73  01-31-21: d-dimer: 5.30 02-07-21: d-dimer: 6.25 02-14-21: d-dimer: 12.74 02-24-21: d-dimer: >20 03-01-21: d-dimer: 14.18 03-10-21: d-dimer: 11.10 03-17-21; d-dimer: 10.44 03-22-21: d-dimer: 10.32  03-29-21: d-dimer: 10.97 04-11-21: d-dimer 12.12 04-25-21: d-dimer: 7.42   05-09-21: d-dimer: 5.60  05-19-21: d-dimer: 5.63 05-24-21; wbc 6.6; hgb 8.9; hct 30.4; mcv 81.1 plt 453 ;glucose 92; bun 16; creat 0.46; k+ 3.8; na++ 137; ca 8.5; GFR >60; liver normal albumin 3.1   TODAY  05-27-21: guaiac negative 06-03-21:  d-dimer: 6.31  06-20-21: wbc 6.3; hgb 9,3; hct 30.8; mcv 79.0 plt 431; tsh 1.144 hgb a1c 6.0  d-dimer 7.14 12/2; 06/29/21: guaiac +  07-04-21 d-dimer: 4.35  Review of Systems  Constitutional:  Negative for malaise/fatigue.  Respiratory:  Negative for cough and shortness of breath.   Cardiovascular:  Negative for chest pain, palpitations and leg swelling.  Gastrointestinal:  Negative for abdominal pain, constipation and heartburn.  Musculoskeletal:  Negative for back pain, joint pain and myalgias.  Skin: Negative.   Neurological:  Negative for dizziness.  Psychiatric/Behavioral:  The patient is not nervous/anxious.     Physical Exam Constitutional:      General: She is not in acute distress.    Appearance: She is well-developed. She is obese. She is not diaphoretic.  Neck:     Thyroid: No thyromegaly.  Cardiovascular:     Rate and Rhythm: Normal rate and regular rhythm.     Pulses: Normal pulses.     Heart sounds: Normal heart sounds.  Pulmonary:     Effort: Pulmonary effort is normal. No respiratory distress.     Breath sounds: Normal breath sounds.  Abdominal:     General: Bowel sounds are normal. There is no distension.     Palpations: Abdomen is soft.     Tenderness: There is no abdominal tenderness.  Musculoskeletal:     Cervical back: Neck supple.     Right lower leg: No edema.     Left lower leg: No edema.     Comments: Is able to move all extremities History of right knee replacement with contracture      Lymphadenopathy:     Cervical: No cervical adenopathy.  Skin:    General: Skin is warm and dry.  Neurological:     Mental Status: She is alert. Mental status is at baseline.  Psychiatric:        Mood and Affect: Mood normal.      ASSESSMENT/ PLAN:  TODAY  Hypercoagulable state due to covid 19 Positive hemoccult stool         Will continue eliquis 5 mg twice daily; her levels are improving Will set GI consult for positive  hemoccult    Ok Edwards  NP [facility]  Contact [phone] Monday through Friday 8am- 5pm  After hours call [phone]

## 2021-07-07 DIAGNOSIS — R195 Other fecal abnormalities: Secondary | ICD-10-CM | POA: Insufficient documentation

## 2021-07-13 ENCOUNTER — Encounter: Payer: Self-pay | Admitting: Adult Health

## 2021-07-13 ENCOUNTER — Non-Acute Institutional Stay (SKILLED_NURSING_FACILITY): Payer: Medicare HMO | Admitting: Adult Health

## 2021-07-13 DIAGNOSIS — F01B0 Vascular dementia, moderate, without behavioral disturbance, psychotic disturbance, mood disturbance, and anxiety: Secondary | ICD-10-CM | POA: Diagnosis not present

## 2021-07-13 DIAGNOSIS — E876 Hypokalemia: Secondary | ICD-10-CM | POA: Diagnosis not present

## 2021-07-13 DIAGNOSIS — I700 Atherosclerosis of aorta: Secondary | ICD-10-CM | POA: Diagnosis not present

## 2021-07-13 NOTE — Progress Notes (Signed)
Location:  [facility] Room Number: 105-D Place of Service:  SNF (31)   CODE STATUS: Full Code   No Known Allergies  Chief Complaint  Patient presents with   Medical Management of Chronic Issues            Vascular dementia without behavioral disturbance:   Aortic atherosclerosis  Hypokalemia:    HPI:  She is a 86 year old long term resident of this facility being seen for the management of her chronic illnesses:  Vascular dementia without behavioral disturbance:   Aortic atherosclerosis  Hypokalemia:. There are no reports of uncontrolled pain. No reports changes in appetite. Does get out of bed daily; and does attend activities.   Past Medical History:  Diagnosis Date   Abnormal posture    Per [facility], [facility] Records [facility]    Anemia    Cognitive communication deficit    Per [facility], [facility]    Contracture of left knee    Per [facility], [facility]    Diabetes mellitus without complication (HCC)    Difficulty walking    Per [facility], [facility]    Extended spectrum beta lactamase (ESBL) resistance    Per [facility], [facility]    Gastroesophageal reflux disease    Per [facility], [facility]    Glaucoma    Gram negative sepsis (HCC)    Per [facility], [facility]    Hypercholesteremia    Hypertension    Hypokalemia    Infection, bacterial    Per [facility], [facility]    Muscle weakness (generalized)    Per [facility], [facility]    Need for assistance with personal care    Per [facility], [facility]    OAB (overactive bladder)    Per [facility],  [facility]    Ocular hypertension, bilateral    Per [facility], [facility]    Osteoarthritis of right knee    Per [facility], [facility]    Paraplegia [facility])    Pyelonephritis, acute    Per [facility], [facility]    Status post total right knee replacement    Unspecified dementia without behavioral disturbance    Per [facility], [facility]    Urgency of urination    Per [facility], [facility]    Wheelchair dependence    Per Ria Bush, [facility]     Past Surgical History:  Procedure Laterality Date   ABDOMINAL HYSTERECTOMY     CATARACT EXTRACTION     COLONOSCOPY N/A 12/10/2013   Procedure: COLONOSCOPY;  Surgeon: West Bali, MD;  Location: [facility];  Service: Endoscopy;  Laterality: N/A;  8:30 AM   TOTAL KNEE ARTHROPLASTY Right 03/30/2016   Procedure: TOTAL KNEE ARTHROPLASTY;  Surgeon: Vickki Hearing, MD;  Location: [facility];  Service: Orthopedics;  Laterality: Right;    Social History   Socioeconomic History   Marital status: Divorced    Spouse name: Not on file   Number of children: Not on file   Years of  education: Not on file   Highest education level: Not on file  Occupational History   Occupation: retired   Tobacco Use   Smoking status: Former    Packs/day: 0.25    Years: 3.00    Pack years: 0.75    Types: Cigarettes   Smokeless tobacco: Never  Vaping Use   Vaping Use: Never used  Substance and Sexual Activity   Alcohol use: No   Drug use: No   Sexual activity: Not Currently    Birth control/protection: Post-menopausal  Other Topics Concern   Not on file  Social History Narrative   Long term resident of [facility]    Social Determinants of Health   Financial Resource  Strain: Not on file  Food Insecurity: Not on file  Transportation Needs: Not on file  Physical Activity: Not on file  Stress: Not on file  Social Connections: Not on file  Intimate Partner Violence: Not on file   Family History  Problem Relation Age of Onset   Cancer Mother    Arthritis Mother    Hypotension Father    Cancer Sister    Hypotension Sister    Colon cancer Neg Hx       VITAL SIGNS BP 120/64    Pulse 86    Temp (!) 97.3 F (36.3 C)    Resp 20    Ht 5\' 5"  (1.651 m)    Wt 198 lb 12.8 oz (90.2 kg)    SpO2 90%    BMI 33.08 kg/m   Outpatient Encounter Medications as of 07/13/2021  Medication Sig   acetaminophen (TYLENOL) 325 MG tablet Take 650 mg by mouth 2 (two) times daily.   apixaban (ELIQUIS) 5 MG TABS tablet Take 5 mg by mouth 2 (two) times daily.   ascorbic acid (VITAMIN C) 500 MG tablet Take 500 mg by mouth daily.   aspirin EC 81 MG tablet Take 81 mg by mouth daily.   calcium citrate (CALCITRATE - DOSED IN MG ELEMENTAL CALCIUM) 950 MG tablet Take 200 mg of elemental calcium by mouth daily.   diclofenac Sodium (VOLTAREN) 1 % GEL Apply 4 g topically 3 (three) times daily as needed. Special Instructions: apply to right knee   diclofenac Sodium (VOLTAREN) 1 % GEL Apply 2 g topically 2 (two) times daily as needed. Special Instructions: apply to left index finger for arthritis pain   iron polysaccharides (NIFEREX) 150 MG capsule Take 1 capsule by mouth daily.   latanoprost (XALATAN) 0.005 % ophthalmic solution Place 1 drop into both eyes at bedtime. wait 5 minutes between multiple eye drops   NON FORMULARY Diet: NAS,   omeprazole (PRILOSEC) 20 MG capsule Take 20 mg by mouth daily. Special Instructions: *TAKE ON AN EMPTY STOMACH* FOR GERD *DO NOT CRUSH* (FORMULARY SUB FOR PANTOPRAZOLE 40MG )   oxybutynin (DITROPAN-XL) 10 MG 24 hr tablet Take 10 mg by mouth daily.   polyethylene glycol powder (GLYCOLAX/MIRALAX) 17 GM/SCOOP powder Take by mouth once.   No  facility-administered encounter medications on file as of 07/13/2021.     SIGNIFICANT DIAGNOSTIC EXAMS  PREVIOUS  02-05-20: DEXA: t score: -2.279  10-20-20: KUB:  There is a nonspecific bowel gas pattern without obstruction. Moderate to marked increase feces in the colon. No renal stone is seen.  01-10-21: kub: significant fecal material identified.   01-17-21: chest x-ray: no evidence of acute cardiopulmonary disease; communicable  disease or tuberculosis   02-18-21: ct angio of chest:  1. Negative for acute PE or thoracic  aortic dissection. 2.  Aortic Atherosclerosis   02-24-21: bilateral lower extremity venous doppler:  No evidence of deep venous thrombosis in either lower extremity.   02-24-21: KUB Moderate colonic stool burden. No evidence of bowel obstruction.  05-05-21: ct angio of chest:  1. No CT evidence for acute pulmonary embolus. 2. Bilateral lower lobe bronchial wall thickening with scattered areas of peripheral airway impaction. Imaging features suggest chronic infectious/inflammatory etiology. 3. Aortic Atherosclerosis   05-19-21: right lower extremity venous doppler:  1. No evidence of deep vein thrombosis within the visualized veins of the right lower extremity. If there are persistent symptoms, repeat venous Doppler examination may be obtained in 3 to 5 days.  2. Right mid femoral and right distal femoral veins not well visualized due to body habitus and patient could not tolerate compression.  NO NEW EXAMS.    LABS REVIEWED PREVIOUS;    10-20-20: wbc 8.2; hgb 13.1; hct 42.8; mcv 80.0 plt 458; glucose 143; bun 17; creat 0.53; k+ 4.4; na++ 131; ca 9.1 GFR>60  liver normal albumin 3.9   01-17-21: wbc 4.3; hgb 10.3; hct 33.6; mcv 81.4 plt 385; glucose 105; bun 11; creat 0.50; k+ 3.5; na++ 134; ca 7.9; GFR >60; d-dimer: 5.93 CRP 5.3 01-24-21: d-dimer: 6.73  01-31-21: d-dimer: 5.30 02-07-21: d-dimer: 6.25 02-14-21: d-dimer: 12.74 02-24-21: d-dimer: >20 03-01-21: d-dimer:  14.18 03-10-21: d-dimer: 11.10 03-17-21; d-dimer: 10.44 03-22-21: d-dimer: 10.32  03-29-21: d-dimer: 10.97 04-11-21: d-dimer 12.12 04-25-21: d-dimer: 7.42   05-09-21: d-dimer: 5.60  05-19-21: d-dimer: 5.63 05-24-21; wbc 6.6; hgb 8.9; hct 30.4; mcv 81.1 plt 453 ;glucose 92; bun 16; creat 0.46; k+ 3.8; na++ 137; ca 8.5; GFR >60; liver normal albumin 3.1  05-27-21: guaiac negative 06-03-21: d-dimer: 6.31  06-20-21: wbc 6.3; hgb 9,3; hct 30.8; mcv 79.0 plt 431; tsh 1.144 hgb a1c 6.0  d-dimer 7.14 12/2; 06/29/21: guaiac +  07-04-21 d-dimer: 4.35  NO NEW LABS   Review of Systems  Constitutional:  Negative for malaise/fatigue.  Respiratory:  Negative for cough and shortness of breath.   Cardiovascular:  Negative for chest pain, palpitations and leg swelling.  Gastrointestinal:  Negative for abdominal pain, constipation and heartburn.  Musculoskeletal:  Negative for back pain, joint pain and myalgias.  Skin: Negative.   Neurological:  Negative for dizziness.  Psychiatric/Behavioral:  The patient is not nervous/anxious.    Physical Exam Constitutional:      General: She is not in acute distress.    Appearance: She is well-developed. She is obese. She is not diaphoretic.  Neck:     Thyroid: No thyromegaly.  Cardiovascular:     Rate and Rhythm: Normal rate and regular rhythm.     Pulses: Normal pulses.     Heart sounds: Normal heart sounds.  Pulmonary:     Effort: Pulmonary effort is normal. No respiratory distress.     Breath sounds: Normal breath sounds.  Abdominal:     General: Bowel sounds are normal. There is no distension.     Palpations: Abdomen is soft.     Tenderness: There is no abdominal tenderness.  Musculoskeletal:     Cervical back: Neck supple.     Right lower leg: No edema.     Left lower leg: No edema.     Comments:  Is able to move all extremities History of right knee replacement with contracture       Lymphadenopathy:     Cervical: No cervical adenopathy.  Skin:     General: Skin is  warm and dry.  Neurological:     Mental Status: She is alert. Mental status is at baseline.  Psychiatric:        Mood and Affect: Mood normal.    ASSESSMENT/ PLAN:  TODAY;   Vascular dementia without behavioral disturbance: is stable weight is 198 pounds will monitor   2. Aortic atherosclerosis (ct 09-03-19) will monitor   3. Hypokalemia: is 3.8 will monitor   PREVIOUS   4. Increased intraocular pressure bilateral is stable will continue xalatan to both eyes.   5. Primary osteoarthritis right knee; left index finger: is stable will continue tylenol 650 mg three times daily voltaren gel 4 gm to right knee three times daily and left index finger 2 gm twice daily   6. Essential hypertension: is stable b/p 125/84 will continue asa 81 mg daily   7. Chronic anemia: is stable hgb 8.9 will continue ferrex daily   8.  Hypercoagulable state due to COVID 19: d-dimer is 4.35 is on eliquis 5 mg twice daily. Is off lovenox therapy   9. GERD without esophagitis: is stable will continue protonix 40 mg daily  10. Urinary urgency: is stable will continue ditropan xl 10 mg daily    Ok Edwards NP [facility]  Contact [phone] Monday through Friday 8am- 5pm  After hours call [phone]

## 2021-07-21 ENCOUNTER — Other Ambulatory Visit (HOSPITAL_COMMUNITY)
Admission: RE | Admit: 2021-07-21 | Discharge: 2021-07-21 | Disposition: A | Discharge status: Home / Self Care | Payer: Medicare HMO | Source: Skilled Nursing Facility | Attending: Adult Health | Admitting: Adult Health

## 2021-07-21 ENCOUNTER — Non-Acute Institutional Stay (SKILLED_NURSING_FACILITY): Payer: Medicare HMO | Admitting: Adult Health

## 2021-07-21 ENCOUNTER — Encounter: Payer: Self-pay | Admitting: Adult Health

## 2021-07-21 DIAGNOSIS — Z7901 Long term (current) use of anticoagulants: Secondary | ICD-10-CM | POA: Insufficient documentation

## 2021-07-21 DIAGNOSIS — D6869 Other thrombophilia: Secondary | ICD-10-CM | POA: Diagnosis not present

## 2021-07-21 DIAGNOSIS — U071 COVID-19: Secondary | ICD-10-CM | POA: Diagnosis not present

## 2021-07-21 LAB — D-DIMER, QUANTITATIVE: D-Dimer, Quant: 8.32 ug/mL-FEU — ABNORMAL HIGH (ref 0.00–0.50)

## 2021-07-21 NOTE — Progress Notes (Signed)
Location:  [place] Room Number: 105 Place of Service:  SNF (31)   CODE STATUS: full code   No Known Allergies  Chief Complaint  Patient presents with   Acute Visit    Follow up lab work     HPI:  She is on eliquis therapy due to elevated d-dimer secondary to COVID. Her d-dimer is more elevated today at 8.32. there are on reports of cough; no shortness of breath; no chest pain.   Past Medical History:  Diagnosis Date   Abnormal posture    Per [facility], [facility] Records [facility]    Anemia    Cognitive communication deficit    Per [facility], [facility]    Contracture of left knee    Per [employer], [facility]    Diabetes mellitus without complication ([place])    Difficulty walking    Per [facility], [facility] Records [facility]    Extended spectrum beta lactamase (ESBL) resistance    Per [facility], [facility] Records [facility]    Gastroesophageal reflux disease    Per [facility], [facility]    Glaucoma    Gram negative sepsis ([place])    Per [facility], [facility] Records [facility]    Hypercholesteremia    Hypertension    Hypokalemia    Infection, bacterial    Per [facility], [facility] Records [facility]    Muscle weakness (generalized)    Per [facility], [facility]    Need for assistance with personal care    Per [facility], [facility] Records [facility]    OAB (overactive bladder)    Per [facility], [facility]    Ocular hypertension, bilateral    Per [facility], [facility]    Osteoarthritis of right knee    Per [facility], [facility] Electronic Medical  Records System    Paraplegia [facility])    Pyelonephritis, acute    Per [facility], [facility] Records [facility]    Status post total right knee replacement    Unspecified dementia without behavioral disturbance    Per [facility], [facility] Records [facility]    Urgency of urination    Per [facility], [facility]    Wheelchair dependence    Per Zadie Cleverly, [facility] Records [facility]     Past Surgical History:  Procedure Laterality Date   ABDOMINAL HYSTERECTOMY     CATARACT EXTRACTION     COLONOSCOPY N/A 12/10/2013   Procedure: COLONOSCOPY;  Surgeon: Danie Binder, MD;  [facility]: [facility];  Service: Endoscopy;  Laterality: N/A;  8:30 AM   TOTAL KNEE ARTHROPLASTY Right 03/30/2016   Procedure: TOTAL KNEE ARTHROPLASTY;  Surgeon: Carole Civil, MD;  Location: [facility];  Service: Orthopedics;  Laterality: Right;    Social History   Socioeconomic History   Marital status: Divorced    Spouse name: Not on file   Number of children: Not on file   Years of education: Not on file   Highest education level: Not on file  Occupational History   Occupation: retired   Tobacco Use   Smoking status: Former    Packs/day: 0.25    Years: 3.00  Pack years: 0.75    Types: Cigarettes   Smokeless tobacco: Never  Vaping Use   Vaping Use: Never used  Substance and Sexual Activity   Alcohol use: No   Drug use: No   Sexual activity: Not Currently    Birth control/protection: Post-menopausal  Other Topics Concern   Not on file  Social History Narrative   Long term resident of [facility]    Social Determinants of Health   Financial Resource Strain: Not on file  Food Insecurity: Not on file  Transportation Needs: Not on file  Physical Activity: Not on file  Stress: Not on file  Social Connections: Not on file  Intimate Partner Violence: Not on file   Family History  Problem  Relation Age of Onset   Cancer Mother    Arthritis Mother    Hypotension Father    Cancer Sister    Hypotension Sister    Colon cancer Neg Hx       VITAL SIGNS BP 110/83    Pulse 68    Temp 97.8 F (36.6 C)    Resp 20    Ht 5\' 5"  (1.651 m)    Wt 198 lb 12.8 oz (90.2 kg)    BMI 33.08 kg/m   Outpatient Encounter Medications as of 07/21/2021  Medication Sig   acetaminophen (TYLENOL) 325 MG tablet Take 650 mg by mouth 2 (two) times daily.   apixaban (ELIQUIS) 5 MG TABS tablet Take 5 mg by mouth 2 (two) times daily.   ascorbic acid (VITAMIN C) 500 MG tablet Take 500 mg by mouth daily.   aspirin EC 81 MG tablet Take 81 mg by mouth daily.   calcium citrate (CALCITRATE - DOSED IN MG ELEMENTAL CALCIUM) 950 MG tablet Take 200 mg of elemental calcium by mouth daily.   diclofenac Sodium (VOLTAREN) 1 % GEL Apply 4 g topically 3 (three) times daily as needed. Special Instructions: apply to right knee   diclofenac Sodium (VOLTAREN) 1 % GEL Apply 2 g topically 2 (two) times daily as needed. Special Instructions: apply to left index finger for arthritis pain   iron polysaccharides (NIFEREX) 150 MG capsule Take 1 capsule by mouth daily.   latanoprost (XALATAN) 0.005 % ophthalmic solution Place 1 drop into both eyes at bedtime. wait 5 minutes between multiple eye drops   NON FORMULARY Diet: NAS,   omeprazole (PRILOSEC) 20 MG capsule Take 20 mg by mouth daily. Special Instructions: *TAKE ON AN EMPTY STOMACH* FOR GERD *DO NOT CRUSH* (FORMULARY SUB FOR PANTOPRAZOLE 40MG )   oxybutynin (DITROPAN-XL) 10 MG 24 hr tablet Take 10 mg by mouth daily.   polyethylene glycol powder (GLYCOLAX/MIRALAX) 17 GM/SCOOP powder Take by mouth once.   No facility-administered encounter medications on file as of 07/21/2021.     SIGNIFICANT DIAGNOSTIC EXAMS   PREVIOUS  02-05-20: DEXA: t score: -2.279  10-20-20: KUB:  There is a nonspecific bowel gas pattern without obstruction. Moderate to marked increase feces in the  colon. No renal stone is seen.  01-10-21: kub: significant fecal material identified.   01-17-21: chest x-ray: no evidence of acute cardiopulmonary disease; communicable  disease or tuberculosis   02-18-21: ct angio of chest:  1. Negative for acute PE or thoracic aortic dissection. 2.  Aortic Atherosclerosis   02-24-21: bilateral lower extremity venous doppler:  No evidence of deep venous thrombosis in either lower extremity.   02-24-21: KUB Moderate colonic stool burden. No evidence of bowel obstruction.  05-05-21: ct angio of chest:  1.  No CT evidence for acute pulmonary embolus. 2. Bilateral lower lobe bronchial wall thickening with scattered areas of peripheral airway impaction. Imaging features suggest chronic infectious/inflammatory etiology. 3. Aortic Atherosclerosis   05-19-21: right lower extremity venous doppler:  1. No evidence of deep vein thrombosis within the visualized veins of the right lower extremity. If there are persistent symptoms, repeat venous Doppler examination may be obtained in 3 to 5 days.  2. Right mid femoral and right distal femoral veins not well visualized due to body habitus and patient could not tolerate compression.  NO NEW EXAMS.    LABS REVIEWED PREVIOUS;    10-20-20: wbc 8.2; hgb 13.1; hct 42.8; mcv 80.0 plt 458; glucose 143; bun 17; creat 0.53; k+ 4.4; na++ 131; ca 9.1 GFR>60  liver normal albumin 3.9   01-17-21: wbc 4.3; hgb 10.3; hct 33.6; mcv 81.4 plt 385; glucose 105; bun 11; creat 0.50; k+ 3.5; na++ 134; ca 7.9; GFR >60; d-dimer: 5.93 CRP 5.3 01-24-21: d-dimer: 6.73  01-31-21: d-dimer: 5.30 02-07-21: d-dimer: 6.25 02-14-21: d-dimer: 12.74 02-24-21: d-dimer: >20 03-01-21: d-dimer: 14.18 03-10-21: d-dimer: 11.10 03-17-21; d-dimer: 10.44 03-22-21: d-dimer: 10.32  03-29-21: d-dimer: 10.97 04-11-21: d-dimer 12.12 04-25-21: d-dimer: 7.42   05-09-21: d-dimer: 5.60  05-19-21: d-dimer: 5.63 05-24-21; wbc 6.6; hgb 8.9; hct 30.4; mcv 81.1 plt 453 ;glucose 92; bun  16; creat 0.46; k+ 3.8; na++ 137; ca 8.5; GFR >60; liver normal albumin 3.1  05-27-21: guaiac negative 06-03-21: d-dimer: 6.31  06-20-21: wbc 6.3; hgb 9,3; hct 30.8; mcv 79.0 plt 431; tsh 1.144 hgb a1c 6.0  d-dimer 7.14 12/2; 06/29/21: guaiac +  07-04-21 d-dimer: 4.35  TODAY  07-21-21: d-dimer: 8.32  Review of Systems  Constitutional:  Negative for malaise/fatigue.  Respiratory:  Negative for cough and shortness of breath.   Cardiovascular:  Negative for chest pain, palpitations and leg swelling.  Gastrointestinal:  Negative for abdominal pain, constipation and heartburn.  Musculoskeletal:  Negative for back pain, joint pain and myalgias.  Skin: Negative.   Neurological:  Negative for dizziness.  Psychiatric/Behavioral:  The patient is not nervous/anxious.    Physical Exam Constitutional:      General: She is not in acute distress.    Appearance: She is well-developed. She is not diaphoretic.  Neck:     Thyroid: No thyromegaly.  Cardiovascular:     Rate and Rhythm: Normal rate and regular rhythm.     Heart sounds: Normal heart sounds.  Pulmonary:     Effort: Pulmonary effort is normal. No respiratory distress.     Breath sounds: Normal breath sounds.  Abdominal:     General: Bowel sounds are normal. There is no distension.     Palpations: Abdomen is soft.     Tenderness: There is no abdominal tenderness.  Musculoskeletal:        General: Normal range of motion.     Cervical back: Neck supple.     Right lower leg: No edema.     Left lower leg: No edema.  Lymphadenopathy:     Cervical: No cervical adenopathy.  Skin:    General: Skin is warm and dry.  Neurological:     Mental Status: She is alert and oriented to person, place, and time.     ASSESSMENT/ PLAN:  TODAY  Hypercoagulable state due to COVID 19: is worse: d-dimer 8.32; will continue eliquis therapy will recheck d-dimer 08-04-21   Ok Edwards NP [facility]   call [phone]

## 2021-08-04 ENCOUNTER — Encounter: Payer: Self-pay | Admitting: Adult Health

## 2021-08-04 ENCOUNTER — Non-Acute Institutional Stay (SKILLED_NURSING_FACILITY): Payer: Medicare HMO | Admitting: Adult Health

## 2021-08-04 ENCOUNTER — Encounter (HOSPITAL_COMMUNITY)
Admission: RE | Admit: 2021-08-04 | Discharge: 2021-08-04 | Disposition: A | Discharge status: Home / Self Care | Payer: Medicare HMO | Source: Skilled Nursing Facility | Attending: Adult Health | Admitting: Adult Health

## 2021-08-04 DIAGNOSIS — D6869 Other thrombophilia: Secondary | ICD-10-CM | POA: Diagnosis not present

## 2021-08-04 DIAGNOSIS — U099 Post covid-19 condition, unspecified: Secondary | ICD-10-CM | POA: Insufficient documentation

## 2021-08-04 DIAGNOSIS — U071 COVID-19: Secondary | ICD-10-CM | POA: Diagnosis not present

## 2021-08-04 LAB — D-DIMER, QUANTITATIVE: D-Dimer, Quant: 11.35 ug/mL-FEU — ABNORMAL HIGH (ref 0.00–0.50)

## 2021-08-04 NOTE — Progress Notes (Signed)
Location:  [facility] Room Number: 105 Place of Service:  SNF (31)   CODE STATUS: full code   No Known Allergies  Chief Complaint  Patient presents with   Acute Visit    Follow up lab work     HPI:  Her d-dimer remains elevated and is worse at 11.35. she remains on eliquis 5 mg twice daily. There are no indications of clotting present. Her scans have been negative in the past. There are no indications of bleeding present.   Past Medical History:  Diagnosis Date   Abnormal posture    Per [facility] Records [facility]    Anemia    Cognitive communication deficit    Per [facility], [facility]    Contracture of left knee    Per [facility], [facility]    Diabetes mellitus without complication ([place])    Difficulty walking    Per [facility], [facility]    Extended spectrum beta lactamase (ESBL) resistance    Per [facility], [facility]    Gastroesophageal reflux disease    Per [facility], [facility]    Glaucoma    Gram negative sepsis ([place])    Per [facility], [facility]    Hypercholesteremia    Hypertension    Hypokalemia    Infection, bacterial    Per [facility], [facility] Records [facility]    Muscle weakness (generalized)    Per [facility], [facility]    Need for assistance with personal care    Per [facility], [facility]    OAB (overactive bladder)    Per [facility], [facility]    Ocular hypertension, bilateral    Per [facility], [facility]    Osteoarthritis of right knee    Per [facility],  [facility]    Paraplegia [facility])    Pyelonephritis, acute    Per [facility], [facility]    Status post total right knee replacement    Unspecified dementia without behavioral disturbance    Per [facility], [facility]    Urgency of urination    Per [facility], [facility]    Wheelchair dependence    Per Zadie Cleverly, [facility] Records [facility]     Past Surgical History:  Procedure Laterality Date   ABDOMINAL HYSTERECTOMY     CATARACT EXTRACTION     COLONOSCOPY N/A 12/10/2013   Procedure: COLONOSCOPY;  Surgeon: Danie Binder, MD;  Location: [facility];  Service: Endoscopy;  Laterality: N/A;  8:30 AM   TOTAL KNEE ARTHROPLASTY Right 03/30/2016   Procedure: TOTAL KNEE ARTHROPLASTY;  Surgeon: Carole Civil, MD;  Location: [facility] ORS;  Service: Orthopedics;  Laterality: Right;    Social History   Socioeconomic History   Marital status: Divorced    Spouse name: Not on file   Number of children: Not on file   Years of education: Not on file   Highest education level: Not on file  Occupational History   Occupation: retired   Tobacco Use   Smoking status: Former    Packs/day:  0.25    Years: 3.00    Pack years: 0.75    Types: Cigarettes   Smokeless tobacco: Never  Vaping Use   Vaping Use: Never used  Substance and Sexual Activity   Alcohol use: No   Drug use: No   Sexual activity: Not Currently    Birth control/protection: Post-menopausal  Other Topics Concern   Not on file  Social History Narrative   Long term resident of [facility]    Social Determinants of Health   Financial Resource Strain: Not on file  Food Insecurity: Not on file  Transportation Needs: Not on file  Physical Activity: Not on file  Stress: Not on file  Social Connections: Not on file  Intimate Partner Violence: Not on  file   Family History  Problem Relation Age of Onset   Cancer Mother    Arthritis Mother    Hypotension Father    Cancer Sister    Hypotension Sister    Colon cancer Neg Hx       VITAL SIGNS BP 116/70    Pulse 94    Temp 97.7 F (36.5 C)    Ht 5\' 5"  (1.651 m)    Wt 197 lb 9.6 oz (89.6 kg)    BMI 32.88 kg/m   Outpatient Encounter Medications as of 08/04/2021  Medication Sig   acetaminophen (TYLENOL) 325 MG tablet Take 650 mg by mouth 2 (two) times daily.   apixaban (ELIQUIS) 5 MG TABS tablet Take 5 mg by mouth 2 (two) times daily.   ascorbic acid (VITAMIN C) 500 MG tablet Take 500 mg by mouth daily.   aspirin EC 81 MG tablet Take 81 mg by mouth daily.   calcium citrate (CALCITRATE - DOSED IN MG ELEMENTAL CALCIUM) 950 MG tablet Take 200 mg of elemental calcium by mouth daily.   diclofenac Sodium (VOLTAREN) 1 % GEL Apply 4 g topically 3 (three) times daily as needed. Special Instructions: apply to right knee   diclofenac Sodium (VOLTAREN) 1 % GEL Apply 2 g topically 2 (two) times daily as needed. Special Instructions: apply to left index finger for arthritis pain   iron polysaccharides (NIFEREX) 150 MG capsule Take 1 capsule by mouth daily.   latanoprost (XALATAN) 0.005 % ophthalmic solution Place 1 drop into both eyes at bedtime. wait 5 minutes between multiple eye drops   NON FORMULARY Diet: NAS,   omeprazole (PRILOSEC) 20 MG capsule Take 20 mg by mouth daily. Special Instructions: *TAKE ON AN EMPTY STOMACH* FOR GERD *DO NOT CRUSH* (FORMULARY SUB FOR PANTOPRAZOLE 40MG )   oxybutynin (DITROPAN-XL) 10 MG 24 hr tablet Take 10 mg by mouth daily.   polyethylene glycol powder (GLYCOLAX/MIRALAX) 17 GM/SCOOP powder Take by mouth once.   No facility-administered encounter medications on file as of 08/04/2021.     SIGNIFICANT DIAGNOSTIC EXAMS  PREVIOUS  02-05-20: DEXA: t score: -2.279  10-20-20: KUB:  There is a nonspecific bowel gas pattern without obstruction. Moderate to marked  increase feces in the colon. No renal stone is seen.  01-10-21: kub: significant fecal material identified.   01-17-21: chest x-ray: no evidence of acute cardiopulmonary disease; communicable  disease or tuberculosis   02-18-21: ct angio of chest:  1. Negative for acute PE or thoracic aortic dissection. 2.  Aortic Atherosclerosis   02-24-21: bilateral lower extremity venous doppler:  No evidence of deep venous thrombosis in either lower extremity.   02-24-21: KUB Moderate colonic stool burden. No evidence of bowel obstruction.  05-05-21: ct angio of  chest:  1. No CT evidence for acute pulmonary embolus. 2. Bilateral lower lobe bronchial wall thickening with scattered areas of peripheral airway impaction. Imaging features suggest chronic infectious/inflammatory etiology. 3. Aortic Atherosclerosis   05-19-21: right lower extremity venous doppler:  1. No evidence of deep vein thrombosis within the visualized veins of the right lower extremity. If there are persistent symptoms, repeat venous Doppler examination may be obtained in 3 to 5 days.  2. Right mid femoral and right distal femoral veins not well visualized due to body habitus and patient could not tolerate compression.  NO NEW EXAMS.    LABS REVIEWED PREVIOUS;    10-20-20: wbc 8.2; hgb 13.1; hct 42.8; mcv 80.0 plt 458; glucose 143; bun 17; creat 0.53; k+ 4.4; na++ 131; ca 9.1 GFR>60  liver normal albumin 3.9   01-17-21: wbc 4.3; hgb 10.3; hct 33.6; mcv 81.4 plt 385; glucose 105; bun 11; creat 0.50; k+ 3.5; na++ 134; ca 7.9; GFR >60; d-dimer: 5.93 CRP 5.3 01-24-21: d-dimer: 6.73  01-31-21: d-dimer: 5.30 02-07-21: d-dimer: 6.25 02-14-21: d-dimer: 12.74 02-24-21: d-dimer: >20 03-01-21: d-dimer: 14.18 03-10-21: d-dimer: 11.10 03-17-21; d-dimer: 10.44 03-22-21: d-dimer: 10.32  03-29-21: d-dimer: 10.97 04-11-21: d-dimer 12.12 04-25-21: d-dimer: 7.42   05-09-21: d-dimer: 5.60  05-19-21: d-dimer: 5.63 05-24-21; wbc 6.6; hgb 8.9; hct 30.4; mcv 81.1  plt 453 ;glucose 92; bun 16; creat 0.46; k+ 3.8; na++ 137; ca 8.5; GFR >60; liver normal albumin 3.1  05-27-21: guaiac negative 06-03-21: d-dimer: 6.31  06-20-21: wbc 6.3; hgb 9,3; hct 30.8; mcv 79.0 plt 431; tsh 1.144 hgb a1c 6.0  d-dimer 7.14 12/2; 06/29/21: guaiac +  07-04-21 d-dimer: 4.35  TODAY  07-21-21: d-dimer: 8.32 08-04-21: d-dimer: 11.35   Review of Systems  Constitutional:  Negative for malaise/fatigue.  Respiratory:  Negative for cough and shortness of breath.   Cardiovascular:  Negative for chest pain, palpitations and leg swelling.  Gastrointestinal:  Negative for abdominal pain, constipation and heartburn.  Musculoskeletal:  Negative for back pain, joint pain and myalgias.  Skin: Negative.   Neurological:  Negative for dizziness.  Psychiatric/Behavioral:  The patient is not nervous/anxious.     Physical Exam Constitutional:      General: She is not in acute distress.    Appearance: She is well-developed. She is not diaphoretic.  Neck:     Thyroid: No thyromegaly.  Cardiovascular:     Rate and Rhythm: Normal rate and regular rhythm.     Pulses: Normal pulses.     Heart sounds: Normal heart sounds.  Pulmonary:     Effort: Pulmonary effort is normal. No respiratory distress.     Breath sounds: Normal breath sounds.  Abdominal:     General: Bowel sounds are normal. There is no distension.     Palpations: Abdomen is soft.     Tenderness: There is no abdominal tenderness.  Musculoskeletal:        General: Normal range of motion.     Cervical back: Neck supple.     Right lower leg: No edema.     Left lower leg: No edema.  Lymphadenopathy:     Cervical: No cervical adenopathy.  Skin:    General: Skin is warm and dry.  Neurological:     Mental Status: She is alert. Mental status is at baseline.  Psychiatric:        Mood and Affect: Mood normal.     ASSESSMENT/ PLAN:  TODAY  Hypercoagulable state associated with COVID 19:  Is worse; will continue  eliquis 5 mg twice daily. Will attempt to get a hematology consult for patient with prolonged elevated d-dimer level on anticoagulation therapy.    Ok Edwards NP [facility]   call [phone]

## 2021-08-11 ENCOUNTER — Ambulatory Visit (INDEPENDENT_AMBULATORY_CARE_PROVIDER_SITE_OTHER): Payer: Medicare HMO | Admitting: Internal Medicine

## 2021-08-11 ENCOUNTER — Encounter: Payer: Self-pay | Admitting: Internal Medicine

## 2021-08-11 VITALS — BP 130/75 | HR 99 | Temp 97.1°F | Ht 65.0 in | Wt 198.0 lb

## 2021-08-11 DIAGNOSIS — R195 Other fecal abnormalities: Secondary | ICD-10-CM | POA: Diagnosis not present

## 2021-08-11 DIAGNOSIS — K5900 Constipation, unspecified: Secondary | ICD-10-CM

## 2021-08-11 DIAGNOSIS — D649 Anemia, unspecified: Secondary | ICD-10-CM | POA: Diagnosis not present

## 2021-08-11 NOTE — Patient Instructions (Signed)
We will continue to watch your blood counts.  If you have evidence of overt GI bleeding, we may need to consider pursuing endoscopic evaluation.  Agree with referral to hematologist.  Follow-up with GI in 6 months.  It was very nice meeting you today.  Dr. Marletta Lor

## 2021-08-11 NOTE — Progress Notes (Signed)
Primary Care Physician:  Sharee Holster, NP Primary Gastroenterologist:  Dr. Marletta Lor  Chief Complaint  Patient presents with   heme +    Denies any blood or dark stools. Takes miralax and bowels moves daily    HPI:   Anna Hanna is a very pleasant 87 y.o. female who presents who presents to GI clinic today by referral from Synthia Innocent at [facility].  Patient has chronic anemia.  Most recent hemoglobin 9.3.  Also on Eliquis for hypercoagulable disorder.  She recently had Hemoccult testing which was positive.  Patient denies any melena hematochezia.  Does have constipation for which she takes MiraLAX for.  States this keeps her bowels moving on a daily basis.  Last colonoscopy 2015 unremarkable.  No personal or family history of colorectal malignancy.  No abdominal pain.  No unintentional weight loss.  Past Medical History:  Diagnosis Date   Abnormal posture    Per [facility] Records [facility]    Anemia    Cognitive communication deficit    Per [facility], [facility]    Contracture of left knee    Per [facility], [facility] Records [facility]    Diabetes mellitus without complication (H[facility])    Difficulty walking    Per [facility], [facility] Records [facility]    Extended spectrum beta lactamase (ESBL) resistance    Per [facility], [facility] Records [facility]    Gastroesophageal reflux disease    Per [facility], [facility]    Glaucoma    Gram negative sepsis (H[facility])    Per [facility], [facility] Records [facility]    Hypercholesteremia    Hypertension    Hypokalemia    Infection, bacterial    Per [facility], [facility] Records [facility]    Muscle weakness (generalized)    Per [facility], [facility]     Need for assistance with personal care    Per [facility], [facility]    OAB (overactive bladder)    Per [facility], [facility]    Ocular hypertension, bilateral    Per [facility], [facility]    Osteoarthritis of right knee    Per [facility], [facility]    Paraplegia [facility])    Pyelonephritis, acute    Per [facility], [facility] Records [facility]    Status post total right knee replacement    Unspecified dementia without behavioral disturbance    Per [facility], [facility]    Urgency of urination    Per [facility], [facility]    Wheelchair dependence    Per Ria Bush, [facility] Records [facility]     Past Surgical History:  Procedure Laterality Date   ABDOMINAL HYSTERECTOMY     CATARACT EXTRACTION     COLONOSCOPY N/A 12/10/2013   Procedure: COLONOSCOPY;  Surgeon: West Bali, MD;  Location: [facility];  Service: Endoscopy;  Laterality: N/A;  8:30 AM   TOTAL KNEE ARTHROPLASTY Right 03/30/2016   Procedure: TOTAL KNEE ARTHROPLASTY;  Surgeon: Vickki Hearing, MD;  Location: [facility];  Service: Orthopedics;  Laterality: Right;    No current outpatient medications on file.  No current facility-administered medications for this visit.    Allergies as of 08/11/2021   (No Known Allergies)    Family History  Problem Relation Age of Onset   Cancer Mother    Arthritis Mother    Hypotension Father    Cancer Sister    Hypotension Sister    Colon cancer Neg Hx     Social History   Socioeconomic History   Marital status: Divorced    Spouse name: Not on file   Number of children: Not on file   Years of education: Not on file   Highest education level: Not on file  Occupational History    Occupation: retired   Tobacco Use   Smoking status: Former    Packs/day: 0.25    Years: 3.00    Pack years: 0.75    Types: Cigarettes   Smokeless tobacco: Never  Vaping Use   Vaping Use: Never used  Substance and Sexual Activity   Alcohol use: No   Drug use: No   Sexual activity: Not Currently    Birth control/protection: Post-menopausal  Other Topics Concern   Not on file  Social History Narrative   [facility]    Social Determinants of Health   Financial Resource Strain: Not on file  Food Insecurity: Not on file  Transportation Needs: Not on file  Physical Activity: Not on file  Stress: Not on file  Social Connections: Not on file  Intimate Partner Violence: Not on file    Subjective: Review of Systems  Constitutional:  Negative for chills and fever.  HENT:  Negative for congestion and hearing loss.   Eyes:  Negative for blurred vision and double vision.  Respiratory:  Negative for cough and shortness of breath.   Cardiovascular:  Negative for chest pain and palpitations.  Gastrointestinal:  Positive for constipation. Negative for abdominal pain, blood in stool, diarrhea, heartburn, melena and vomiting.  Genitourinary:  Negative for dysuria and urgency.  Musculoskeletal:  Negative for joint pain and myalgias.  Skin:  Negative for itching and rash.  Neurological:  Negative for dizziness and headaches.  Psychiatric/Behavioral:  Negative for depression. The patient is not nervous/anxious.       Objective: BP 130/75    Pulse 99    Temp (!) 97.1 F (36.2 C)    Ht 5\' 5"  (1.651 m)    Wt 198 lb (89.8 kg)    BMI 32.95 kg/m  Physical Exam Constitutional:      Appearance: Normal appearance. She is obese.     Comments: In a wheelchair  HENT:     Head: Normocephalic and atraumatic.  Eyes:     Extraocular Movements: Extraocular movements intact.     Conjunctiva/sclera: Conjunctivae normal.  Cardiovascular:     Rate and Rhythm: Normal rate and regular  rhythm.  Pulmonary:     Effort: Pulmonary effort is normal.     Breath sounds: Normal breath sounds.  Abdominal:     General: Bowel sounds are normal.     Palpations: Abdomen is soft.  Musculoskeletal:        General: No swelling. Normal range of motion.     Cervical back: Normal range of motion and neck supple.  Skin:    General: Skin is warm and dry.     Coloration: Skin is not jaundiced.  Neurological:     General: No focal deficit present.     Mental Status: She is alert and oriented to person, place, and  time.  Psychiatric:        Mood and Affect: Mood normal.        Behavior: Behavior normal.     Assessment: *Hemoccult positive stool *Constipation-well-controlled on MiraLAX *Anemia-chronic  Plan: Discuss anemia and heme positive stool in depth with patient today.  I offered colonoscopy to further evaluate versus conservative management and she would like to just watch for now.  I think given her age, this is reasonable.  I did counsel her that by not performing a colonoscopy, we could be missing source of bleeding in her colon including possible malignancy and she understands.  Agree with referral to hematology.  Continue MiraLAX as needed for constipation  Follow-up with GI in 6 months or sooner if needed.  08/11/2021 2:17 PM   Disclaimer: This note was dictated with voice recognition software. Similar sounding words can inadvertently be transcribed and may not be corrected upon review.

## 2021-08-23 ENCOUNTER — Non-Acute Institutional Stay (SKILLED_NURSING_FACILITY): Payer: Medicare HMO | Admitting: Internal Medicine

## 2021-08-23 ENCOUNTER — Encounter: Payer: Self-pay | Admitting: Internal Medicine

## 2021-08-23 DIAGNOSIS — D649 Anemia, unspecified: Secondary | ICD-10-CM

## 2021-08-23 DIAGNOSIS — I700 Atherosclerosis of aorta: Secondary | ICD-10-CM

## 2021-08-23 DIAGNOSIS — E1322 Other specified diabetes mellitus with diabetic chronic kidney disease: Secondary | ICD-10-CM

## 2021-08-23 DIAGNOSIS — E441 Mild protein-calorie malnutrition: Secondary | ICD-10-CM | POA: Diagnosis not present

## 2021-08-23 DIAGNOSIS — I129 Hypertensive chronic kidney disease with stage 1 through stage 4 chronic kidney disease, or unspecified chronic kidney disease: Secondary | ICD-10-CM

## 2021-08-23 DIAGNOSIS — N182 Chronic kidney disease, stage 2 (mild): Secondary | ICD-10-CM

## 2021-08-23 NOTE — Progress Notes (Signed)
NURSING HOME LOCATION:  [facility] ROOM NUMBER:  105 D  CODE STATUS:  Full Code PCP : Ok Edwards, NP  This is a nursing facility follow up visit of chronic medical diagnoses & to document compliance with Regulation 483.30 (c) in [employer] Phase 2 which mandates caregiver visit ( visits can alternate among physician, PA or NP as per statutes) within 10 days of 30 days / 60 days/ 90 days post admission to SNF date    Interim medical record and care since last SNF visit was updated with review of diagnostic studies and change in clinical status since last visit were documented.  HPI: She is a permanent resident of the facility with medical diagnoses of GERD, dyslipidemia, essential hypertension, OAB, paraplegia, dementia, and diabetes with neurovascular complications. Most current labs were completed as of 06/20/2021.  Creatinine was 0.46 and GFR greater than 60 indicating CKD stage II.  Albumin was reduced to 3.1 but total protein was low normal at 6.8.  Microcytic, normochromic anemia was present with H/H of 9.3/30.8.  This is in the context of Eliquis prophylaxis for hypercoagulable disorder.  A1c is prediabetic at 6%.  TSH was therapeutic at 1.144. GI follow-up was completed by Dr. Abbey Chatters on 08/11/2021 to evaluate heme positive stools.  Last colonoscopy was 2015 and was unremarkable.  Repeat colonoscopy was proposed but the patient preferred watchful monitoring at this time.  Given her age Dr. Abbey Chatters felt this was reasonable.  Hematology referral was scheduled for 2/3.  Review of systems: Dementia invalidated responses. She gave the year as "220". She did not remember that she had seen the GI specialist.  When I reminded her; she stated the had "no concerns".  Her only concern is that she has "boo-boos and doo-doos with MiraLAX" .  Staff states that she does have increased frequency of bowel movements related to dietary noncompliance as she ingests  large quantities of chocolate, cheese puffs and other snacks. Review of systems was otherwise negative except for "ankles sore".  Constitutional: No fever, significant weight change, fatigue  Eyes: No redness, discharge, pain, vision change ENT/mouth: No nasal congestion,  purulent discharge, earache, change in hearing, sore throat  Cardiovascular: No chest pain, palpitations, paroxysmal nocturnal dyspnea, claudication, edema  Respiratory: No cough, sputum production, hemoptysis, DOE, significant snoring, apnea   Gastrointestinal: No heartburn, dysphagia, abdominal pain, nausea /vomiting, rectal bleeding, melena Genitourinary: No dysuria, hematuria, pyuria, incontinence, nocturia Dermatologic: No rash, pruritus, change in appearance of skin Neurologic: No dizziness, headache, syncope, seizures, numbness, tingling Psychiatric: No significant anxiety, depression, insomnia, anorexia Endocrine: No change in hair/skin/nails, excessive thirst, excessive hunger, excessive urination  Hematologic/lymphatic: No significant bruising, lymphadenopathy, abnormal bleeding Allergy/immunology: No itchy/watery eyes, significant sneezing, urticaria, angioedema  Physical exam:  Pertinent or positive findings: As noted she is pleasantly demented.  She is edentulous.  Slight tachycardia is present.  There is slight increase in the first and second heart sounds.  Abdomen is protuberant.  Pedal pulses are decreased.  As I palpated the ankles she stated they were sore.  There was no change in color or temperature  or swelling @ the ankles. There is slight nonpitting edema of the feet.  She has isolated partial contractions of the fingers.  General appearance: Adequately nourished; no acute distress, increased work of breathing is present.   Lymphatic: No lymphadenopathy about the head, neck, axilla. Eyes: No conjunctival inflammation or lid edema is present. There is no scleral icterus. Ears:  External ear exam shows  no significant lesions or deformities.   Nose:  External nasal examination shows no deformity or inflammation. Nasal mucosa are pink and moist without lesions, exudates Oral exam:  Lips and gums are healthy appearing. There is no oropharyngeal erythema or exudate. Neck:  No thyromegaly, masses, tenderness noted.    Heart:  regular rhythm normal without gallop, murmur, click, rub .  Lungs: Chest clear to auscultation without wheezes, rhonchi, rales, rubs. Abdomen: Bowel sounds are normal. Abdomen is soft and nontender with no organomegaly, hernias, masses. GU: Deferred  Extremities:  No cyanosis, clubbing  Neurologic exam : Balance, Rhomberg, finger to nose testing could not be completed due to clinical state Skin: Warm & dry w/o tenting. No significant lesions or rash.  See summary under each active problem in the Problem List with associated updated therapeutic plan

## 2021-08-24 NOTE — Assessment & Plan Note (Signed)
A1c is prediabetic at 6%.  Creatinine is 0.46 EGFR greater than 60 indicating CKD stage II.  No change in medications indicated.

## 2021-08-24 NOTE — Assessment & Plan Note (Addendum)
Microcytic, normochromic anemia is present with H/H of 9.3/30.8.  Repeat colonoscopic evaluation was deferred @ her request. Dr Marletta Lor acquiesced due to age in lieu of watchful monitoring. Ability to give informed consent is doubtful @ best.

## 2021-08-24 NOTE — Assessment & Plan Note (Signed)
She voices no active cardiovascular symptoms at this time but as noted is pleasantly demented.

## 2021-08-24 NOTE — Assessment & Plan Note (Signed)
Total protein is low normal at 6.8 and albumin 3.1.  There is no significant limb atrophy or interosseous wasting.  Nutritionist will follow at the SNF.

## 2021-08-24 NOTE — Patient Instructions (Signed)
See assessment and plan under each diagnosis in the problem list and acutely for this visit 

## 2021-08-26 ENCOUNTER — Non-Acute Institutional Stay (SKILLED_NURSING_FACILITY): Payer: Medicare HMO | Admitting: Adult Health

## 2021-08-26 ENCOUNTER — Inpatient Hospital Stay (HOSPITAL_COMMUNITY): Payer: Medicare HMO

## 2021-08-26 ENCOUNTER — Encounter: Payer: Self-pay | Admitting: Adult Health

## 2021-08-26 ENCOUNTER — Other Ambulatory Visit: Payer: Self-pay

## 2021-08-26 ENCOUNTER — Inpatient Hospital Stay (HOSPITAL_COMMUNITY): Payer: Medicare HMO | Attending: Hematology | Admitting: Hematology

## 2021-08-26 VITALS — BP 131/77 | HR 108 | Temp 96.6°F | Resp 18

## 2021-08-26 DIAGNOSIS — I700 Atherosclerosis of aorta: Secondary | ICD-10-CM

## 2021-08-26 DIAGNOSIS — F01B0 Vascular dementia, moderate, without behavioral disturbance, psychotic disturbance, mood disturbance, and anxiety: Secondary | ICD-10-CM | POA: Diagnosis not present

## 2021-08-26 DIAGNOSIS — Z87891 Personal history of nicotine dependence: Secondary | ICD-10-CM | POA: Insufficient documentation

## 2021-08-26 DIAGNOSIS — D509 Iron deficiency anemia, unspecified: Secondary | ICD-10-CM | POA: Diagnosis not present

## 2021-08-26 DIAGNOSIS — Z7901 Long term (current) use of anticoagulants: Secondary | ICD-10-CM | POA: Diagnosis not present

## 2021-08-26 DIAGNOSIS — Z809 Family history of malignant neoplasm, unspecified: Secondary | ICD-10-CM | POA: Diagnosis not present

## 2021-08-26 DIAGNOSIS — E119 Type 2 diabetes mellitus without complications: Secondary | ICD-10-CM | POA: Insufficient documentation

## 2021-08-26 DIAGNOSIS — I10 Essential (primary) hypertension: Secondary | ICD-10-CM | POA: Diagnosis not present

## 2021-08-26 DIAGNOSIS — R7989 Other specified abnormal findings of blood chemistry: Secondary | ICD-10-CM | POA: Diagnosis present

## 2021-08-26 DIAGNOSIS — D649 Anemia, unspecified: Secondary | ICD-10-CM

## 2021-08-26 DIAGNOSIS — Z90710 Acquired absence of both cervix and uterus: Secondary | ICD-10-CM | POA: Diagnosis not present

## 2021-08-26 DIAGNOSIS — E441 Mild protein-calorie malnutrition: Secondary | ICD-10-CM

## 2021-08-26 LAB — IRON AND TIBC
Iron: 18 ug/dL — ABNORMAL LOW (ref 28–170)
Saturation Ratios: 5 % — ABNORMAL LOW (ref 10.4–31.8)
TIBC: 361 ug/dL (ref 250–450)
UIBC: 343 ug/dL

## 2021-08-26 LAB — APTT: aPTT: 32 seconds (ref 24–36)

## 2021-08-26 LAB — CBC WITH DIFFERENTIAL/PLATELET
Abs Immature Granulocytes: 0.01 10*3/uL (ref 0.00–0.07)
Basophils Absolute: 0 10*3/uL (ref 0.0–0.1)
Basophils Relative: 1 %
Eosinophils Absolute: 0.1 10*3/uL (ref 0.0–0.5)
Eosinophils Relative: 2 %
HCT: 32.9 % — ABNORMAL LOW (ref 36.0–46.0)
Hemoglobin: 9.9 g/dL — ABNORMAL LOW (ref 12.0–15.0)
Immature Granulocytes: 0 %
Lymphocytes Relative: 20 %
Lymphs Abs: 1.1 10*3/uL (ref 0.7–4.0)
MCH: 23.3 pg — ABNORMAL LOW (ref 26.0–34.0)
MCHC: 30.1 g/dL (ref 30.0–36.0)
MCV: 77.4 fL — ABNORMAL LOW (ref 80.0–100.0)
Monocytes Absolute: 0.6 10*3/uL (ref 0.1–1.0)
Monocytes Relative: 11 %
Neutro Abs: 4 10*3/uL (ref 1.7–7.7)
Neutrophils Relative %: 66 %
Platelets: 410 10*3/uL — ABNORMAL HIGH (ref 150–400)
RBC: 4.25 MIL/uL (ref 3.87–5.11)
RDW: 17.2 % — ABNORMAL HIGH (ref 11.5–15.5)
WBC: 5.8 10*3/uL (ref 4.0–10.5)
nRBC: 0 % (ref 0.0–0.2)

## 2021-08-26 LAB — PROTIME-INR
INR: 1.4 — ABNORMAL HIGH (ref 0.8–1.2)
Prothrombin Time: 17.1 seconds — ABNORMAL HIGH (ref 11.4–15.2)

## 2021-08-26 LAB — D-DIMER, QUANTITATIVE: D-Dimer, Quant: 13.43 ug/mL-FEU — ABNORMAL HIGH (ref 0.00–0.50)

## 2021-08-26 LAB — FOLATE: Folate: 16.5 ng/mL (ref >5.9)

## 2021-08-26 LAB — FIBRINOGEN: Fibrinogen: 477 mg/dL — ABNORMAL HIGH (ref 210–475)

## 2021-08-26 LAB — VITAMIN B12: Vitamin B-12: 314 pg/mL (ref 180–914)

## 2021-08-26 LAB — FERRITIN: Ferritin: 10 ng/mL — ABNORMAL LOW (ref 11–307)

## 2021-08-26 NOTE — Progress Notes (Signed)
Location:  [place] Room Number: 105 Place of Service:  SNF (31)   CODE STATUS: full code   No Known Allergies  Chief Complaint  Patient presents with   Acute Visit    Care plan meeting.     HPI:  We have come together for her care plan meeting. BIMS 11/15 mood 7/30: not sleeping well; depression; nervous. She requires extensive assist to dependent for her adl care. She is incontinent of bladder and bowel. She is nonambulatory with no falls. Dietary: NAS diet with good appetite weight is 197.6 pounds; is stable. Therapy: none at this time.  She continues to be followed for her chronic illnesses including:  Aortic atherosclerosis  Moderate vascular dementia without behavioral disturbance without mood disturbance; mood  Mild protein calorie malnutrition  Past Medical History:  Diagnosis Date   Abnormal posture    Per [facility] Electronic Medical Records System    Anemia    Cognitive communication deficit    Per [facility] Records [facility]    Contracture of left knee    Per [facility] Records [facility]    Diabetes mellitus without complication ([place])    Difficulty walking    Per [facility] Records [facility]    Extended spectrum beta lactamase (ESBL) resistance    Per [facility] Records [facility]    Gastroesophageal reflux disease    Per [facility] Records [facility]    Glaucoma    Gram negative sepsis ([place])    Per [facility] Records [facility]    Hypercholesteremia    Hypertension    Hypokalemia    Infection, bacterial    Per [facility] Records [facility]    Muscle weakness (generalized)    Per [facility] Records [facility]    Need for assistance with  personal care    Per [facility] Records [facility]    OAB (overactive bladder)    Per [facility] Records [facility]    Ocular hypertension, bilateral    Per [facility] Records [facility]    Osteoarthritis of right knee    Per [facility] Records [facility]    Paraplegia [facility])    Pyelonephritis, acute    Per [facility] Records [facility]    Status post total right knee replacement    Unspecified dementia without behavioral disturbance    Per [facility] Records [facility]    Urgency of urination    Per [facility] Records [facility]    Wheelchair dependence    Per [facility], [facility] Records [facility]     Past Surgical History:  Procedure Laterality Date   ABDOMINAL HYSTERECTOMY     CATARACT EXTRACTION     COLONOSCOPY N/A 12/10/2013   Procedure: COLONOSCOPY;  Surgeon: Danie Binder, MD;  Location: [facility];  Service: Endoscopy;  Laterality: N/A;  8:30 AM   TOTAL KNEE ARTHROPLASTY Right 03/30/2016   Procedure: TOTAL KNEE ARTHROPLASTY;  Surgeon: Carole Civil, MD;  Location: [facility];  Service: Orthopedics;  Laterality: Right;    Social History   Socioeconomic History   Marital status: Divorced    Spouse name:  Not on file   Number of children: Not on file   Years of education: Not on file   Highest education level: Not on file  Occupational History   Occupation: retired   Tobacco Use   Smoking status: Former    Packs/day: 0.25    Years: 3.00    Pack years: 0.75    Types: Cigarettes   Smokeless tobacco: Never  Vaping Use   Vaping Use: Never used  Substance and Sexual Activity   Alcohol use: No   Drug use: No   Sexual activity: Not Currently    Birth control/protection: Post-menopausal  Other Topics  Concern   Not on file  Social History Narrative   [facility] resident of [facility]    Social Determinants of Health   Financial Resource Strain: Not on file  Food Insecurity: Not on file  Transportation Needs: Not on file  Physical Activity: Not on file  Stress: Not on file  Social Connections: Not on file  Intimate Partner Violence: Not on file   Family History  Problem Relation Age of Onset   Cancer Mother    Arthritis Mother    Hypotension Father    Cancer Sister    Hypotension Sister    Colon cancer Neg Hx       VITAL SIGNS BP 116/60    Pulse 68    Temp 97.7 F (36.5 C)    Resp 18    Ht 5\' 5"  (1.651 m)    Wt 197 lb 9.6 oz (89.6 kg)    BMI 32.88 kg/m   Outpatient Encounter Medications as of 08/26/2021  Medication Sig   acetaminophen (TYLENOL) 325 MG tablet Take 650 mg by mouth 2 (two) times daily.   apixaban (ELIQUIS) 5 MG TABS tablet Take 5 mg by mouth 2 (two) times daily.   ascorbic acid (VITAMIN C) 500 MG tablet Take 500 mg by mouth daily.   aspirin EC 81 MG tablet Take 81 mg by mouth daily.   calcium citrate (CALCITRATE - DOSED IN MG ELEMENTAL CALCIUM) 950 MG tablet Take 200 mg of elemental calcium by mouth daily.   diclofenac Sodium (VOLTAREN) 1 % GEL Apply 4 g topically 3 (three) times daily as needed. Special Instructions: apply to right knee   diclofenac Sodium (VOLTAREN) 1 % GEL Apply 2 g topically 2 (two) times daily as needed. Special Instructions: apply to left index finger for arthritis pain   iron polysaccharides (NIFEREX) 150 MG capsule Take 1 capsule by mouth daily.   latanoprost (XALATAN) 0.005 % ophthalmic solution Place 1 drop into both eyes at bedtime. wait 5 minutes between multiple eye drops   NON FORMULARY Diet: NAS,   omeprazole (PRILOSEC) 20 MG capsule Take 20 mg by mouth daily. Special Instructions: *TAKE ON AN EMPTY STOMACH* FOR GERD *DO NOT CRUSH* (FORMULARY SUB FOR PANTOPRAZOLE 40MG )   oxybutynin (DITROPAN-XL) 10 MG 24 hr tablet Take 10 mg by mouth  daily.   polyethylene glycol powder (GLYCOLAX/MIRALAX) 17 GM/SCOOP powder Take by mouth as needed.   No facility-administered encounter medications on file as of 08/26/2021.     SIGNIFICANT DIAGNOSTIC EXAMS  PREVIOUS  02-05-20: DEXA: t score: -2.279  10-20-20: KUB:  There is a nonspecific bowel gas pattern without obstruction. Moderate to marked increase feces in the colon. No renal stone is seen.  01-10-21: kub: significant fecal material identified.   01-17-21: chest x-ray: no evidence of acute cardiopulmonary disease; communicable  disease or tuberculosis   02-18-21: ct angio  of chest:  1. Negative for acute PE or thoracic aortic dissection. 2.  Aortic Atherosclerosis   02-24-21: bilateral lower extremity venous doppler:  No evidence of deep venous thrombosis in either lower extremity.   02-24-21: KUB Moderate colonic stool burden. No evidence of bowel obstruction.  05-05-21: ct angio of chest:  1. No CT evidence for acute pulmonary embolus. 2. Bilateral lower lobe bronchial wall thickening with scattered areas of peripheral airway impaction. Imaging features suggest chronic infectious/inflammatory etiology. 3. Aortic Atherosclerosis   05-19-21: right lower extremity venous doppler:  1. No evidence of deep vein thrombosis within the visualized veins of the right lower extremity. If there are persistent symptoms, repeat venous Doppler examination may be obtained in 3 to 5 days.  2. Right mid femoral and right distal femoral veins not well visualized due to body habitus and patient could not tolerate compression.  NO NEW EXAMS.    LABS REVIEWED PREVIOUS;    10-20-20: wbc 8.2; hgb 13.1; hct 42.8; mcv 80.0 plt 458; glucose 143; bun 17; creat 0.53; k+ 4.4; na++ 131; ca 9.1 GFR>60  liver normal albumin 3.9   01-17-21: wbc 4.3; hgb 10.3; hct 33.6; mcv 81.4 plt 385; glucose 105; bun 11; creat 0.50; k+ 3.5; na++ 134; ca 7.9; GFR >60; d-dimer: 5.93 CRP 5.3 01-24-21: d-dimer: 6.73  01-31-21:  d-dimer: 5.30 02-07-21: d-dimer: 6.25 02-14-21: d-dimer: 12.74 02-24-21: d-dimer: >20 03-01-21: d-dimer: 14.18 03-10-21: d-dimer: 11.10 03-17-21; d-dimer: 10.44 03-22-21: d-dimer: 10.32  03-29-21: d-dimer: 10.97 04-11-21: d-dimer 12.12 04-25-21: d-dimer: 7.42   05-09-21: d-dimer: 5.60  05-19-21: d-dimer: 5.63 05-24-21; wbc 6.6; hgb 8.9; hct 30.4; mcv 81.1 plt 453 ;glucose 92; bun 16; creat 0.46; k+ 3.8; na++ 137; ca 8.5; GFR >60; liver normal albumin 3.1  05-27-21: guaiac negative 06-03-21: d-dimer: 6.31  06-20-21: wbc 6.3; hgb 9,3; hct 30.8; mcv 79.0 plt 431; tsh 1.144 hgb a1c 6.0  d-dimer 7.14 12/2; 06/29/21: guaiac +  07-04-21 d-dimer: 4.35 07-21-21: d-dimer: 8.32 08-04-21: d-dimer: 11.35   NO NEW LABS.   Review of Systems  Constitutional:  Negative for malaise/fatigue.  Respiratory:  Negative for cough and shortness of breath.   Cardiovascular:  Negative for chest pain, palpitations and leg swelling.  Gastrointestinal:  Negative for abdominal pain, constipation and heartburn.  Musculoskeletal:  Negative for back pain, joint pain and myalgias.  Skin: Negative.   Neurological:  Negative for dizziness.  Psychiatric/Behavioral:  The patient is not nervous/anxious.    Physical Exam Constitutional:      General: She is not in acute distress.    Appearance: She is well-developed and overweight. She is not diaphoretic.  Neck:     Thyroid: No thyromegaly.  Cardiovascular:     Rate and Rhythm: Normal rate and regular rhythm.     Heart sounds: Normal heart sounds.  Pulmonary:     Effort: Pulmonary effort is normal. No respiratory distress.     Breath sounds: Normal breath sounds.  Abdominal:     General: Bowel sounds are normal. There is no distension.     Palpations: Abdomen is soft.     Tenderness: There is no abdominal tenderness.  Musculoskeletal:     Cervical back: Neck supple.     Right lower leg: No edema.     Left lower leg: No edema.     Comments: Is able to move all  extremities History of right knee replacement with contracture    Lymphadenopathy:     Cervical: No cervical adenopathy.  Skin:  General: Skin is warm and dry.  Neurological:     Mental Status: She is alert. Mental status is at baseline.  Psychiatric:        Mood and Affect: Mood normal.      ASSESSMENT/ PLAN:  TODAY  Aortic atherosclerosis Moderate vascular dementia without behavioral disturbance without mood disturbance; mood Mild protein calorie malnutrition  Will continue current medications Will continue current plan of care Will continue to monitor her status.   Time spent with patient 40 minutes: medications; plan of care.    Ok Edwards NP [facility]   call [phone]

## 2021-08-26 NOTE — Patient Instructions (Addendum)
[facility] at [facility] Discharge Instructions  You were seen and examined today by Dr. Ellin Saba. Dr. Ellin Saba is a hematologist, meaning that he specializes in blood abnormalities. Dr. Ellin Saba discussed your past medical history, family history of cancers/blood conditions and the events that led to you being here today.  You were referred to Dr. Ellin Saba due to an elevated D-Dimer level. This has remained elevated for an extended period of time. You have also been anemic. This is typically indicative of a clot being present anywhere within your body. Dr. Ellin Saba has recommended additional lab work today in an attempt to identify the cause of the elevated D-Dimer as well as the cause of anemia.  Follow-up as scheduled.   Thank you for choosing [facility] at [facility] to provide your oncology and hematology care.  To afford each patient quality time with our provider, please arrive at least 15 minutes before your scheduled appointment time.   If you have a lab appointment with the Cancer Center please come in thru the Main Entrance and check in at the main information desk.  You need to re-schedule your appointment should you arrive 10 or more minutes late.  We strive to give you quality time with our providers, and arriving late affects you and other patients whose appointments are after yours.  Also, if you no show three or more times for appointments you may be dismissed from the clinic at the providers discretion.     Again, thank you for choosing [facility].  Our hope is that these requests will decrease the amount of time that you wait before being seen by our physicians.       _____________________________________________________________  Should you have questions after your visit to [facility], please contact our office at [phone] and follow the prompts.  Our office hours are 8:00 a.m. and 4:30  p.m. Monday - Friday.  Please note that voicemails left after 4:00 p.m. may not be returned until the following business day.  We are closed weekends and major holidays.  You do have access to a nurse 24-7, just call the main number to the clinic [phone] and do not press any options, hold on the line and a nurse will answer the phone.    For prescription refill requests, have your pharmacy contact our office and allow 72 hours.    Due to Covid, you will need to wear a mask upon entering the hospital. If you do not have a mask, a mask will be given to you at the Main Entrance upon arrival. For doctor visits, patients may have 1 support person age 21 or older with them. For treatment visits, patients can not have anyone with them due to social distancing guidelines and our immunocompromised population.

## 2021-08-26 NOTE — Progress Notes (Signed)
[place] [address] [place] [address]   CLINIC:  Medical Oncology/Hematology  Patient Care Team: Gerlene Fee, NP as PCP - General (Geriatric Medicine) Center, [place] [facility]) Eloise Harman, DO as Consulting Physician (Gastroenterology) Derek Jack, MD as Medical Oncologist (Hematology)  CHIEF COMPLAINTS/PURPOSE OF CONSULTATION:  Evaluation of prolonged elevated d-dimer on anticoagulation therapy  HISTORY OF PRESENTING ILLNESS:  Anna Hanna 87 y.o. female is here because of evaluation of prolonged elevated d-dimer on anticoagulation therapy, at the request of [facility].  Today she reports feeling well. She reports abdominal pain. She was started on Eliquis BID in June 2022 when she tested positive for Covid-19 and was found to have elevated d-dimer. She denies history of cardiac issues, DVT, and PE. She denies history of cancer. She denies weight loss, diarrhea, hematuria, hematochezia, and black stools.   She currently lives at the [facility] center for about 2 years. 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 She does not have any family that lives locally. She denies family history of cancer. She quit smoking 2 years ago after smoking about 1/2 a pack a week for 65 years.   MEDICAL HISTORY:  Past Medical History:  Diagnosis Date   Abnormal posture    Per [facility] Electronic Medical Records System    Anemia    Cognitive communication deficit    Per [facility] Records [facility]    Contracture of left knee    Per [facility] Records [facility]    Diabetes mellitus without complication ([place])    Difficulty walking    Per [facility] Records [facility]    Extended spectrum beta lactamase (ESBL) resistance    Per [facility] Records [facility]     Gastroesophageal reflux disease    Per [facility] Records [facility]    Glaucoma    Gram negative sepsis ([place])    Per [facility] Records [facility]    Hypercholesteremia    Hypertension    Hypokalemia    Infection, bacterial    Per [facility] Records [facility]    Muscle weakness (generalized)    Per [facility] Records [facility]    Need for assistance with personal care    Per [facility] Records [facility]    OAB (overactive bladder)    Per [facility]    Ocular hypertension, bilateral    Per [facility]    Osteoarthritis of right knee    Per [facility] Records [facility]    Paraplegia [facility])    Pyelonephritis, acute    Per [facility] Records [facility]    Status post total right knee replacement    Unspecified dementia without behavioral disturbance    Per [facility] Records [facility]    Urgency of urination    Per [facility]    Wheelchair dependence    Per [facility]     SURGICAL HISTORY: Past Surgical History:  Procedure Laterality Date   ABDOMINAL HYSTERECTOMY     CATARACT EXTRACTION     COLONOSCOPY N/A  12/10/2013   Procedure: COLONOSCOPY;  Surgeon: Danie Binder, MD;  Location: [facility];  Service: Endoscopy;  Laterality: N/A;  8:30 AM   TOTAL KNEE ARTHROPLASTY Right 03/30/2016   Procedure: TOTAL KNEE ARTHROPLASTY;  Surgeon: Carole Civil, MD;  Location: [facility] ORS;  Service: Orthopedics;  Laterality: Right;    SOCIAL HISTORY: Social History   Socioeconomic History   Marital status:  Divorced    Spouse name: Not on file   Number of children: Not on file   Years of education: Not on file   Highest education level: Not on file  Occupational History   Occupation: retired   Tobacco Use   Smoking status: Former    Packs/day: 0.25    Years: 3.00    Pack years: 0.75    Types: Cigarettes   Smokeless tobacco: Never  Vaping Use   Vaping Use: Never used  Substance and Sexual Activity   Alcohol use: No   Drug use: No   Sexual activity: Not Currently    Birth control/protection: Post-menopausal  Other Topics Concern   Not on file  Social History Narrative   Long term resident of [facility]    Social Determinants of Health   Financial Resource Strain: Not on file  Food Insecurity: Not on file  Transportation Needs: Not on file  Physical Activity: Not on file  Stress: Not on file  Social Connections: Not on file  Intimate Partner Violence: Not on file    FAMILY HISTORY: Family History  Problem Relation Age of Onset   Cancer Mother    Arthritis Mother    Hypotension Father    Cancer Sister    Hypotension Sister    Colon cancer Neg Hx     ALLERGIES:  has No Known Allergies.  MEDICATIONS:  No current outpatient medications on file.   No current facility-administered medications for this visit.    REVIEW OF SYSTEMS:   Review of Systems  Constitutional:  Negative for appetite change, fatigue and unexpected weight change.  Gastrointestinal:  Positive for abdominal pain (3/10), nausea and vomiting. Negative for blood in stool and diarrhea.  Genitourinary:  Negative for hematuria.   All other systems reviewed and are negative.   PHYSICAL EXAMINATION: ECOG PERFORMANCE STATUS: 2 - Symptomatic, <50% confined to bed  Vitals:   08/26/21 1055  BP: 131/77  Pulse: (!) 108  Resp: 18  Temp: (!) 96.6 F (35.9 C)  SpO2: 96%   Filed Weights   Physical Exam Vitals reviewed.  Constitutional:      Appearance: Normal appearance.     Comments: In wheelchair   Cardiovascular:     Rate and Rhythm: Normal rate and regular rhythm.     Pulses: Normal pulses.     Heart sounds: Normal heart sounds.  Pulmonary:     Effort: Pulmonary effort is normal.     Breath sounds: Normal breath sounds.  Abdominal:     Palpations: Abdomen is soft. There is no hepatomegaly, splenomegaly or mass.     Tenderness: There is no abdominal tenderness.  Musculoskeletal:     Right lower leg: No edema.     Left lower leg: No edema.  Lymphadenopathy:     Cervical: No cervical adenopathy.     Right cervical: No superficial cervical adenopathy.    Left cervical: No superficial cervical adenopathy.     Upper Body:     Right upper body: No supraclavicular, axillary or pectoral adenopathy.     Left upper body:  No supraclavicular, axillary or pectoral adenopathy.  Neurological:     General: No focal deficit present.     Mental Status: She is alert and oriented to person, place, and time.  Psychiatric:        Mood and Affect: Mood normal.        Behavior: Behavior normal.     LABORATORY DATA:  I have reviewed the data as listed Recent Results (from the past 2160 hour(s))  D-dimer, quantitative     Status: Abnormal   Collection Time: 06/03/21  4:00 AM  Result Value Ref Range   D-Dimer, Quant 6.31 (H) 0.00 - 0.50 ug/mL-FEU    Comment: (NOTE) At the manufacturer cut-off value of 0.5 g/mL FEU, this assay has a negative predictive value of 95-100%.This assay is intended for use in conjunction with a clinical pretest probability (PTP) assessment model to exclude pulmonary embolism (PE) and deep venous thrombosis (DVT) in outpatients suspected of PE or DVT. Results should be correlated with clinical presentation. Performed at [facility], [address], [place], [place] [address]   CBC     Status: Abnormal   Collection Time: 06/20/21  4:10 AM  Result Value Ref Range   WBC 6.3 4.0 - 10.5 K/uL   RBC 3.90 3.87 - 5.11 MIL/uL   Hemoglobin 9.3 (L) 12.0 - 15.0 g/dL    HCT 30.8 (L) 36.0 - 46.0 %   MCV 79.0 (L) 80.0 - 100.0 fL   MCH 23.8 (L) 26.0 - 34.0 pg   MCHC 30.2 30.0 - 36.0 g/dL   RDW 15.9 (H) 11.5 - 15.5 %   Platelets 431 (H) 150 - 400 K/uL   nRBC 0.0 0.0 - 0.2 %    Comment: Performed at [facility], [address], [place], [place] [address]  D-dimer, quantitative     Status: Abnormal   Collection Time: 06/20/21  4:10 AM  Result Value Ref Range   D-Dimer, Quant 7.14 (H) 0.00 - 0.50 ug/mL-FEU    Comment: (NOTE) At the manufacturer cut-off value of 0.5 g/mL FEU, this assay has a negative predictive value of 95-100%.This assay is intended for use in conjunction with a clinical pretest probability (PTP) assessment model to exclude pulmonary embolism (PE) and deep venous thrombosis (DVT) in outpatients suspected of PE or DVT. Results should be correlated with clinical presentation. Performed at [facility], [address], [place], [place] [address]   TSH     Status: None   Collection Time: 06/20/21  4:10 AM  Result Value Ref Range   TSH 1.144 0.350 - 4.500 uIU/mL    Comment: Performed by a 3rd Generation assay with a functional sensitivity of <=0.01 uIU/mL. Performed at [facility], [address], [place], [place] [address]   Hemoglobin A1c     Status: Abnormal   Collection Time: 06/20/21  4:10 AM  Result Value Ref Range   Hgb A1c MFr Bld 6.0 (H) 4.8 - 5.6 %    Comment: (NOTE)         Prediabetes: 5.7 - 6.4         Diabetes: >6.4         Glycemic control for adults with diabetes: <7.0    Mean Plasma Glucose 126 mg/dL    Comment: (NOTE) Performed At: [facility] [place], [place] [id number] Rush Farmer MD [id number]   Occult blood card to lab, stool     Status: Abnormal   Collection Time: 06/24/21  5:14 AM  Result Value  Ref Range   Fecal Occult Bld POSITIVE (A) NEGATIVE    Comment: Performed at [facility], [address] [place], [place] [address]  Occult blood card to lab, stool RN will collect      Status: Abnormal   Collection Time: 06/29/21  9:30 AM  Result Value Ref Range   Fecal Occult Bld POSITIVE (A) NEGATIVE    Comment: Performed at [facility], [address] [place], [place] [address]  D-dimer, quantitative     Status: Abnormal   Collection Time: 07/04/21  8:28 AM  Result Value Ref Range   D-Dimer, Quant 4.35 (H) 0.00 - 0.50 ug/mL-FEU    Comment: (NOTE) At the manufacturer cut-off value of 0.5 g/mL FEU, this assay has a negative predictive value of 95-100%.This assay is intended for use in conjunction with a clinical pretest probability (PTP) assessment model to exclude pulmonary embolism (PE) and deep venous thrombosis (DVT) in outpatients suspected of PE or DVT. Results should be correlated with clinical presentation. Performed at [facility], [address], [place], [place] [address]   D-dimer, quantitative     Status: Abnormal   Collection Time: 07/21/21  3:50 AM  Result Value Ref Range   D-Dimer, Quant 8.32 (H) 0.00 - 0.50 ug/mL-FEU    Comment: (NOTE) At the manufacturer cut-off value of 0.5 g/mL FEU, this assay has a negative predictive value of 95-100%.This assay is intended for use in conjunction with a clinical pretest probability (PTP) assessment model to exclude pulmonary embolism (PE) and deep venous thrombosis (DVT) in outpatients suspected of PE or DVT. Results should be correlated with clinical presentation. Performed at [facility], [address], [place], [place] [address]   D-dimer, quantitative     Status: Abnormal   Collection Time: 08/04/21  4:15 AM  Result Value Ref Range   D-Dimer, Quant 11.35 (H) 0.00 - 0.50 ug/mL-FEU    Comment: (NOTE) At the manufacturer cut-off value of 0.5 g/mL FEU, this assay has a negative predictive value of 95-100%.This assay is intended for use in conjunction with a clinical pretest probability (PTP) assessment model to exclude pulmonary embolism (PE) and deep venous thrombosis (DVT) in outpatients  suspected of PE or DVT. Results should be correlated with clinical presentation. Performed at [facility], [address] [place], [place] [address]   D-dimer, quantitative     Status: Abnormal   Collection Time: 08/26/21 11:44 AM  Result Value Ref Range   D-Dimer, Quant 13.43 (H) 0.00 - 0.50 ug/mL-FEU    Comment: (NOTE) At the manufacturer cut-off value of 0.5 g/mL FEU, this assay has a negative predictive value of 95-100%.This assay is intended for use in conjunction with a clinical pretest probability (PTP) assessment model to exclude pulmonary embolism (PE) and deep venous thrombosis (DVT) in outpatients suspected of PE or DVT. Results should be correlated with clinical presentation. Performed at [facility], [address] [place], [place] [address]   Fibrinogen     Status: Abnormal   Collection Time: 08/26/21 11:44 AM  Result Value Ref Range   Fibrinogen 477 (H) 210 - 475 mg/dL    Comment: (NOTE) Fibrinogen results may be underestimated in patients receiving thrombolytic therapy. Performed at [facility], [address], [place], [place] [address]   Protime-INR     Status: Abnormal   Collection Time: 08/26/21 11:44 AM  Result Value Ref Range   Prothrombin Time 17.1 (H) 11.4 - 15.2 seconds   INR 1.4 (H) 0.8 - 1.2    Comment: (  NOTE) INR goal varies based on device and disease states. Performed at [facility], [address], [place], [place] [address]   APTT     Status: None   Collection Time: 08/26/21 11:44 AM  Result Value Ref Range   aPTT 32 24 - 36 seconds    Comment: Performed at [facility], [address], [place], [place] [address]  Vitamin B12     Status: None   Collection Time: 08/26/21 11:44 AM  Result Value Ref Range   Vitamin B-12 314 180 - 914 pg/mL    Comment: (NOTE) This assay is not validated for testing neonatal or myeloproliferative syndrome specimens for Vitamin B12 levels. Performed at [facility], [address] [place], [place] [address]    Ferritin     Status: Abnormal   Collection Time: 08/26/21 11:44 AM  Result Value Ref Range   Ferritin 10 (L) 11 - 307 ng/mL    Comment: Performed at [facility], [address], [place], [place] [address]  Iron and TIBC [facility] ONLY)     Status: Abnormal   Collection Time: 08/26/21 11:44 AM  Result Value Ref Range   Iron 18 (L) 28 - 170 ug/dL   TIBC 361 250 - 450 ug/dL   Saturation Ratios 5 (L) 10.4 - 31.8 %   UIBC 343 ug/dL    Comment: Performed at [facility], [address] [place], [place] [address]  CBC with Differential     Status: Abnormal   Collection Time: 08/26/21 11:44 AM  Result Value Ref Range   WBC 5.8 4.0 - 10.5 K/uL   RBC 4.25 3.87 - 5.11 MIL/uL   Hemoglobin 9.9 (L) 12.0 - 15.0 g/dL   HCT 32.9 (L) 36.0 - 46.0 %   MCV 77.4 (L) 80.0 - 100.0 fL   MCH 23.3 (L) 26.0 - 34.0 pg   MCHC 30.1 30.0 - 36.0 g/dL   RDW 17.2 (H) 11.5 - 15.5 %   Platelets 410 (H) 150 - 400 K/uL   nRBC 0.0 0.0 - 0.2 %   Neutrophils Relative % 66 %   Neutro Abs 4.0 1.7 - 7.7 K/uL   Lymphocytes Relative 20 %   Lymphs Abs 1.1 0.7 - 4.0 K/uL   Monocytes Relative 11 %   Monocytes Absolute 0.6 0.1 - 1.0 K/uL   Eosinophils Relative 2 %   Eosinophils Absolute 0.1 0.0 - 0.5 K/uL   Basophils Relative 1 %   Basophils Absolute 0.0 0.0 - 0.1 K/uL   Immature Granulocytes 0 %   Abs Immature Granulocytes 0.01 0.00 - 0.07 K/uL    Comment: Performed at [facility], [address], [place], [place] [address]    RADIOGRAPHIC STUDIES: I have personally reviewed the radiological images as listed and agreed with the findings in the report. No results found.  ASSESSMENT:  Prolonged elevated d-dimer on anticoagulation therapy: - From extensive review of the chart, it appears that she had COVID infection and had elevated D-dimer in June 2022. - Eliquis was started at low-dose of 2.5 mg twice daily around 01/19/2021.  At some point she was switched to Lovenox and then switched back to  Eliquis at 5 mg twice daily which she remains on at this time. - She had a CT chest angiogram on 02/18/2021 on 05/05/2021 both of which were negative for pulmonary embolism.  Lower extremity Doppler on 02/24/2021 was negative for DVT. - Denies any recent infections or hospitalizations.  Denies any recent surgeries. - Denies any  B symptoms.  Denies bleeding per rectum or melena.  Last colonoscopy was in 2015 which was normal. - CTAP on 09/03/2019 showed aortobiiliac atherosclerosis with no aneurysm.  No enlarged abdominal pelvic lymph nodes.  Spleen was normal in size with no abnormality.   Social/family history: - She reports that she has been at [facility] for the last couple of years after she broke her right leg and cannot walk.  She is wheelchair dependent.  She worked in a [employer] prior to retirement.  Quit smoking 2 years ago.  Smoked half pack per day for 15 years. - No family history of malignancies.   PLAN:  Prolonged elevated d-dimer on anticoagulation therapy: - We will repeat D-dimer today and will check for PT and PTT. - We will also check for fibrinogen to evaluate for chronic DIC is one of the causes of D-dimer elevation. - We will also check factor V and factor VIII levels which are reducing acute DIC and are normal in chronic DIC. - We will also check 2D echocardiogram. - RTC 2 to 3 weeks for follow-up.  2.  Microcytic anemia: - Last hemoglobin was 9.3 with MCV 79. - We will check for nutritional deficiencies.  We will also check SPEP.   All questions were answered. The patient knows to call the clinic with any problems, questions or concerns.  Derek Jack, MD 08/26/21 2:17 PM  [place] [phone]   I, Thana Ates, am acting as a scribe for Dr. Derek Jack.  I, Derek Jack MD, have reviewed the above documentation for accuracy and completeness, and I agree with the above.

## 2021-08-27 LAB — FACTOR 5 ASSAY: Factor V Activity: 114 % (ref 70–150)

## 2021-08-27 LAB — FACTOR 8 ASSAY: Coagulation Factor VIII: 233 % — ABNORMAL HIGH (ref 56–140)

## 2021-08-29 LAB — PROTEIN ELECTROPHORESIS, SERUM
A/G Ratio: 0.9 (ref 0.7–1.7)
Albumin ELP: 3.1 g/dL (ref 2.9–4.4)
Alpha-1-Globulin: 0.3 g/dL (ref 0.0–0.4)
Alpha-2-Globulin: 0.9 g/dL (ref 0.4–1.0)
Beta Globulin: 1.2 g/dL (ref 0.7–1.3)
Gamma Globulin: 1.2 g/dL (ref 0.4–1.8)
Globulin, Total: 3.5 g/dL (ref 2.2–3.9)
Total Protein ELP: 6.6 g/dL (ref 6.0–8.5)

## 2021-08-29 LAB — PATHOLOGIST SMEAR REVIEW

## 2021-08-29 LAB — METHYLMALONIC ACID, SERUM: Methylmalonic Acid, Quantitative: 133 nmol/L (ref 0–378)

## 2021-08-30 LAB — COPPER, SERUM: Copper: 143 ug/dL (ref 80–158)

## 2021-09-14 ENCOUNTER — Ambulatory Visit (HOSPITAL_COMMUNITY): Payer: Medicare HMO | Admitting: Hematology

## 2021-09-15 ENCOUNTER — Encounter: Payer: Self-pay | Admitting: Adult Health

## 2021-09-15 ENCOUNTER — Non-Acute Institutional Stay (SKILLED_NURSING_FACILITY): Payer: Medicare HMO | Admitting: Adult Health

## 2021-09-15 DIAGNOSIS — H40053 Ocular hypertension, bilateral: Secondary | ICD-10-CM | POA: Diagnosis not present

## 2021-09-15 DIAGNOSIS — M1711 Unilateral primary osteoarthritis, right knee: Secondary | ICD-10-CM

## 2021-09-15 DIAGNOSIS — I10 Essential (primary) hypertension: Secondary | ICD-10-CM | POA: Diagnosis not present

## 2021-09-15 NOTE — Progress Notes (Signed)
Provider: Ok Edwards np  [facility]   PCP: Gerlene Fee, NP   Extended Emergency Contact Information Primary Emergency Contact: Sheron Nightingale, [place] [address] [place] Phone: [phone] Relation: Relative Secondary Emergency Contact: King,Juanita          [place], [place] [address] [place] Phone: [phone] Relation: Relative  Codes status: full code  Goals of care: advanced directive information Advanced Directives 08/26/2021  Does Patient Have a Medical Advance Directive? No  Type of Advance Directive -  Does patient want to make changes to medical advance directive? No - Patient declined  Copy of [place] in Chart? -  Would patient like information on creating a medical advance directive? No - Patient declined  Pre-existing out of facility DNR order (yellow form or pink MOST form) -     No Known Allergies  Chief Complaint  Patient presents with   Annual Exam    HPI  She is a 87 year old long term resident of this facility being seen for her annual exam. She has not required any hospitalizations or visits to the ED. She has had covid over the past year with complication; has hypercoagulable state. Has been seen by hematology . Her weight is stable. Overall her stable remains stable. There are no reports of uncontrolled pain. She continues to be followed for her chronic illnesses including: Increased ocular pressure bilateral:  Primary osteoarthritis of right knee; left index finger: Essential hypertension   Past Medical History:  Diagnosis Date   Abnormal posture    Per [facility], [facility] Records [facility]    Anemia    Cognitive communication deficit    Per [facility], [facility]    Contracture of left knee    Per [facility], [facility] Records [facility]    Diabetes mellitus  without complication ([place])    Difficulty walking    Per [facility], [facility]    Extended spectrum beta lactamase (ESBL) resistance    Per [facility], [facility] Records [facility]    Gastroesophageal reflux disease    Per [facility], [facility]    Glaucoma    Gram negative sepsis ([place])    Per [facility], [facility] Records [facility]    Hypercholesteremia    Hypertension    Hypokalemia    Infection, bacterial    Per [facility], [facility] Records [facility]    Muscle weakness (generalized)    Per [facility], [facility]    Need for assistance with personal care    Per [facility], [facility]    OAB (overactive bladder)    Per [facility], [facility]    Ocular hypertension, bilateral    Per [facility], [facility]    Osteoarthritis of right knee    Per [facility], [facility]    Paraplegia [facility])    Pyelonephritis, acute    Per [facility], [facility] Records [facility]    Status post total right knee replacement    Unspecified dementia without behavioral disturbance    Per [facility], [facility]  Urgency of urination    Per [facility], [facility]    Wheelchair dependence    Per Zadie Cleverly, [facility] Records [facility]    Past Surgical History:  Procedure Laterality Date   ABDOMINAL HYSTERECTOMY     CATARACT EXTRACTION     COLONOSCOPY N/A 12/10/2013   Procedure: COLONOSCOPY;  Surgeon: Danie Binder, MD;  [facility];  Service: Endoscopy;  Laterality: N/A;  8:30 AM   TOTAL KNEE ARTHROPLASTY Right  03/30/2016   Procedure: TOTAL KNEE ARTHROPLASTY;  Surgeon: Carole Civil, MD;  Location: [facility];  Service: Orthopedics;  Laterality: Right;    reports that she has quit smoking. Her smoking use included cigarettes. She has a 0.75 pack-year smoking history. She has never used smokeless tobacco. She reports that she does not drink alcohol and does not use drugs. Social History   Tobacco Use   Smoking status: Former    Packs/day: 0.25    Years: 3.00    Pack years: 0.75    Types: Cigarettes   Smokeless tobacco: Never  Vaping Use   Vaping Use: Never used  Substance Use Topics   Alcohol use: No   Drug use: No   Family History  Problem Relation Age of Onset   Cancer Mother    Arthritis Mother    Hypotension Father    Cancer Sister    Hypotension Sister    Colon cancer Neg Hx     Pertinent  Health Maintenance Due  Topic Date Due   OPHTHALMOLOGY EXAM  09/22/2021   HEMOGLOBIN A1C  12/18/2021   FOOT EXAM  01/10/2022   URINE MICROALBUMIN  03/22/2022   INFLUENZA VACCINE  Completed   DEXA SCAN  Completed   Fall Risk 02/17/2020 03/14/2021 08/04/2021 08/26/2021 08/26/2021  Falls in the past year? 0 0 0 - 0  Was there an injury with Fall? - 0 0 - 0  Fall Risk Category Calculator - 0 0 - 0  Fall Risk Category - Low Low - Low  Patient Fall Risk Level Low fall risk Low fall risk Low fall risk High fall risk Low fall risk  Patient at Risk for Falls Due to Impaired balance/gait;Impaired mobility - Impaired balance/gait - Impaired balance/gait;Impaired mobility   Depression screen [facility] 2/9 08/26/2021 08/04/2021 07/22/2021 07/07/2021 06/21/2021  Decreased Interest 0 0 0 0 0  Down, Depressed, Hopeless 0 0 0 0 0  PHQ - 2 Score 0 0 0 0 0  Altered sleeping 0 - - 0 -  Tired, decreased energy 0 - - 0 -  Change in appetite 0 - - 0 -  Feeling bad or failure about yourself  0 - - 0 -  Trouble concentrating 0 - - 0 -  Moving slowly or fidgety/restless 0 - - 0 -  Suicidal thoughts 0 - - 0 -  PHQ-9  Score 0 - - 0 -        Outpatient Encounter Medications as of 09/15/2021  Medication Sig   acetaminophen (TYLENOL) 325 MG tablet Take 650 mg by mouth 2 (two) times daily.   apixaban (ELIQUIS) 5 MG TABS tablet Take 5 mg by mouth 2 (two) times daily.   ascorbic acid (VITAMIN C) 500 MG tablet Take 500 mg by mouth daily.   aspirin EC 81 MG tablet Take 81 mg by mouth daily.   calcium citrate (CALCITRATE - DOSED IN MG ELEMENTAL CALCIUM) 950 MG tablet Take 200 mg of elemental  calcium by mouth daily.   diclofenac Sodium (VOLTAREN) 1 % GEL Apply 4 g topically 3 (three) times daily as needed. Special Instructions: apply to right knee   diclofenac Sodium (VOLTAREN) 1 % GEL Apply 2 g topically 2 (two) times daily as needed. Special Instructions: apply to left index finger for arthritis pain   iron polysaccharides (NIFEREX) 150 MG capsule Take 1 capsule by mouth daily.   latanoprost (XALATAN) 0.005 % ophthalmic solution Place 1 drop into both eyes at bedtime. wait 5 minutes between multiple eye drops   NON FORMULARY Diet: NAS,   omeprazole (PRILOSEC) 20 MG capsule Take 20 mg by mouth daily. Special Instructions: *TAKE ON AN EMPTY STOMACH* FOR GERD *DO NOT CRUSH* (FORMULARY SUB FOR PANTOPRAZOLE 40MG )   oxybutynin (DITROPAN-XL) 10 MG 24 hr tablet Take 10 mg by mouth daily.   polyethylene glycol powder (GLYCOLAX/MIRALAX) 17 GM/SCOOP powder Take by mouth as needed.   No facility-administered encounter medications on file as of 09/15/2021.     Vitals:   09/15/21 1115  BP: 104/62  Pulse: 70  Temp: 97.8 F (36.6 C)  Weight: 200 lb (90.7 kg)  Height: 5\' 5"  (1.651 m)   Body mass index is 33.28 kg/m.  DIAGNOSTIC EXAMS   PREVIOUS  02-05-20: DEXA: t score: -2.279  10-20-20: KUB:  There is a nonspecific bowel gas pattern without obstruction. Moderate to marked increase feces in the colon. No renal stone is seen.  01-10-21: kub: significant fecal material identified.   01-17-21: chest x-ray: no  evidence of acute cardiopulmonary disease; communicable  disease or tuberculosis   02-18-21: ct angio of chest:  1. Negative for acute PE or thoracic aortic dissection. 2.  Aortic Atherosclerosis   02-24-21: bilateral lower extremity venous doppler:  No evidence of deep venous thrombosis in either lower extremity.   02-24-21: KUB Moderate colonic stool burden. No evidence of bowel obstruction.  05-05-21: ct angio of chest:  1. No CT evidence for acute pulmonary embolus. 2. Bilateral lower lobe bronchial wall thickening with scattered areas of peripheral airway impaction. Imaging features suggest chronic infectious/inflammatory etiology. 3. Aortic Atherosclerosis   05-19-21: right lower extremity venous doppler:  1. No evidence of deep vein thrombosis within the visualized veins of the right lower extremity. If there are persistent symptoms, repeat venous Doppler examination may be obtained in 3 to 5 days.  2. Right mid femoral and right distal femoral veins not well visualized due to body habitus and patient could not tolerate compression.  NO NEW EXAMS.    LABS REVIEWED PREVIOUS;    10-20-20: wbc 8.2; hgb 13.1; hct 42.8; mcv 80.0 plt 458; glucose 143; bun 17; creat 0.53; k+ 4.4; na++ 131; ca 9.1 GFR>60  liver normal albumin 3.9   01-17-21: wbc 4.3; hgb 10.3; hct 33.6; mcv 81.4 plt 385; glucose 105; bun 11; creat 0.50; k+ 3.5; na++ 134; ca 7.9; GFR >60; d-dimer: 5.93 CRP 5.3 01-24-21: d-dimer: 6.73  01-31-21: d-dimer: 5.30 02-07-21: d-dimer: 6.25 02-14-21: d-dimer: 12.74 02-24-21: d-dimer: >20 03-01-21: d-dimer: 14.18 03-10-21: d-dimer: 11.10 03-17-21; d-dimer: 10.44 03-22-21: d-dimer: 10.32  03-29-21: d-dimer: 10.97 04-11-21: d-dimer 12.12 04-25-21: d-dimer: 7.42   05-09-21: d-dimer: 5.60  05-19-21: d-dimer: 5.63 05-24-21; wbc 6.6; hgb 8.9; hct 30.4; mcv 81.1 plt 453 ;glucose 92; bun 16; creat 0.46; k+ 3.8; na++ 137; ca 8.5; GFR >60; liver normal albumin 3.1  05-27-21: guaiac negative 06-03-21:  d-dimer: 6.31  06-20-21: wbc 6.3; hgb 9,3; hct 30.8; mcv 79.0 plt 431; tsh 1.144 hgb  a1c 6.0  d-dimer 7.14 12/2; 06/29/21: guaiac +  07-04-21 d-dimer: 4.35  NO NEW LABS   Review of Systems  Constitutional:  Negative for malaise/fatigue.  Respiratory:  Negative for cough and shortness of breath.   Cardiovascular:  Negative for chest pain, palpitations and leg swelling.  Gastrointestinal:  Negative for abdominal pain, constipation and heartburn.  Musculoskeletal:  Negative for back pain, joint pain and myalgias.  Skin: Negative.   Neurological:  Negative for dizziness.  Psychiatric/Behavioral:  The patient is not nervous/anxious.     Physical Exam Constitutional:      General: She is not in acute distress.    Appearance: She is well-developed. She is obese. She is not diaphoretic.  Neck:     Thyroid: No thyromegaly.  Cardiovascular:     Rate and Rhythm: Normal rate and regular rhythm.     Pulses: Normal pulses.     Heart sounds: Normal heart sounds.  Pulmonary:     Effort: Pulmonary effort is normal. No respiratory distress.     Breath sounds: Normal breath sounds.  Abdominal:     General: Bowel sounds are normal. There is no distension.     Palpations: Abdomen is soft.     Tenderness: There is no abdominal tenderness.  Musculoskeletal:     Cervical back: Neck supple.     Right lower leg: No edema.     Left lower leg: No edema.     Comments:  Is able to move all extremities History of right knee replacement with contracture     Lymphadenopathy:     Cervical: No cervical adenopathy.  Skin:    General: Skin is warm and dry.  Neurological:     Mental Status: She is alert. Mental status is at baseline.  Psychiatric:        Mood and Affect: Mood normal.    ASSESSMENT/ PLAN:  TODAY;   Increased ocular pressure bilateral: is stable will continue xalatan to both eyes  2. Primary osteoarthritis of right knee; left index finger: is stable will continue tylenol 650 mg three  times daily voltaren gel 1% 4 gm to right knee three times daily left index finger 2 gm twice daily   3. Essential hypertension: stable b/p104/62 will continue asa 81 mg daily   PREVIOUS   4.  Chronic anemia: is stable hgb 8.9 will continue ferrex daily   5.  Hypercoagulable state due to COVID 19: d-dimer is 4.35 is on eliquis 5 mg twice daily. Is off lovenox therapy   6. GERD without esophagitis: is stable will continue protonix 40 mg daily  7. Urinary urgency: is stable will continue ditropan xl 10 mg daily   8. Vascular dementia without behavioral disturbance: is stable weight is 200 pounds will monitor   9. Aortic atherosclerosis (ct 09-03-19) will monitor   10. Hypokalemia: is 3.8 will monitor     Ok Edwards NP [facility]   [phone]

## 2021-09-20 ENCOUNTER — Ambulatory Visit (HOSPITAL_COMMUNITY)
Admission: RE | Admit: 2021-09-20 | Discharge: 2021-09-20 | Disposition: A | Discharge status: Home / Self Care | Payer: Medicare HMO | Source: Ambulatory Visit | Attending: Hematology | Admitting: Hematology

## 2021-09-20 DIAGNOSIS — R7989 Other specified abnormal findings of blood chemistry: Secondary | ICD-10-CM | POA: Diagnosis present

## 2021-09-20 DIAGNOSIS — I10 Essential (primary) hypertension: Secondary | ICD-10-CM | POA: Insufficient documentation

## 2021-09-20 DIAGNOSIS — M24562 Contracture, left knee: Secondary | ICD-10-CM | POA: Insufficient documentation

## 2021-09-20 DIAGNOSIS — E785 Hyperlipidemia, unspecified: Secondary | ICD-10-CM | POA: Diagnosis not present

## 2021-09-20 DIAGNOSIS — E119 Type 2 diabetes mellitus without complications: Secondary | ICD-10-CM | POA: Diagnosis not present

## 2021-09-20 DIAGNOSIS — Z8616 Personal history of COVID-19: Secondary | ICD-10-CM | POA: Insufficient documentation

## 2021-09-20 LAB — ECHOCARDIOGRAM COMPLETE
AR max vel: 1.25 cm2
AV Area VTI: 1.33 cm2
AV Area mean vel: 1.35 cm2
AV Mean grad: 5.1 mmHg
AV Peak grad: 10.8 mmHg
Ao pk vel: 1.65 m/s
Area-P 1/2: 2.22 cm2
Height: 65 in
S' Lateral: 2.9 cm
Weight: 3200 oz

## 2021-09-20 NOTE — Progress Notes (Signed)
*  PRELIMINARY RESULTS* Echocardiogram 2D Echocardiogram has been performed.  Anna Hanna 09/20/2021, 12:35 PM

## 2021-09-22 ENCOUNTER — Inpatient Hospital Stay (HOSPITAL_COMMUNITY): Payer: Medicare HMO | Attending: Hematology | Admitting: Hematology

## 2021-10-13 ENCOUNTER — Encounter: Payer: Self-pay | Admitting: Adult Health

## 2021-10-13 ENCOUNTER — Non-Acute Institutional Stay (SKILLED_NURSING_FACILITY): Payer: Medicare HMO | Admitting: Adult Health

## 2021-10-13 DIAGNOSIS — D649 Anemia, unspecified: Secondary | ICD-10-CM

## 2021-10-13 DIAGNOSIS — D6869 Other thrombophilia: Secondary | ICD-10-CM

## 2021-10-13 DIAGNOSIS — K219 Gastro-esophageal reflux disease without esophagitis: Secondary | ICD-10-CM

## 2021-10-13 DIAGNOSIS — U071 COVID-19: Secondary | ICD-10-CM

## 2021-10-13 NOTE — Progress Notes (Signed)
?Location:  [facility] ?Nursing Home Room Number: 105-D ?Place of Service:  SNF (31) ? ? ?CODE STATUS: Full Code ? ?No Known Allergies ? ?Chief Complaint  ?Patient presents with  ? Medical Management of Chronic Issues ? ?                  Chronic anemia  Hypercoagulable state due to COVID 19: GERD without esophagitis:  ? ? ?HPI: ? ?She is a 87 year old long term resident of this facility being seen for the management of her chronic illnesses: Chronic anemia  Hypercoagulable state due to COVID 19: GERD without esophagitis. There are no reports of uncontrolled pain. She does get out of bed daily; participates inactivities  ? ?Past Medical History:  ?Diagnosis Date  ? Abnormal posture   ? Per Ria [facility], [facility] Electronic [facility] Records [facility]   ? Anemia   ? Cognitive communication deficit   ? Per Ria Bush, [facility] Records [facility]   ? Contracture of left knee   ? Per Ria Bush, [facility] Records [facility]   ? Diabetes mellitus without complication (HCC)   ? Difficulty walking   ? Per Ria Bush, [facility] Records [facility]   ? Extended spectrum beta lactamase (ESBL) resistance   ? Per Ria Bush, [facility] Records [facility]   ? Gastroesophageal reflux disease   ? Per Ria Bush, [facility] Records [facility]   ? Glaucoma   ? Gram negative sepsis (HCC)   ? Per Ria Bush, [facility] Records [facility]   ? Hypercholesteremia   ? Hypertension   ? Hypokalemia   ? Infection, bacterial   ? Per [facility], [facility] Electronic Medical Records System   ? Muscle weakness (generalized)   ? Per Ria Bush, [facility] Records [facility]   ? Need for assistance with personal care   ? Per Ria Bush, [facility] Records [facility]   ? OAB (overactive bladder)   ? Per Ria Bush, [facility] Electronic Medical  Records System   ? Ocular hypertension, bilateral   ? Per [facility], [facility] Records [facility]   ? Osteoarthritis of right knee   ? Per Ria Bush, [facility] Records [facility]   ? Paraplegia (HCC)   ? Pyelonephritis, acute   ? Per Ria Bush, [facility] Records [facility]   ? Status post total right knee replacement   ? Unspecified dementia without behavioral disturbance   ? Per Ria Bush, [facility] Records [facility]   ? Urgency of urination   ? Per Ria Bush, [facility] Records [facility]   ? Wheelchair dependence   ? Per Ria Bush, [facility] Records [facility]   ? ? ?Past Surgical History:  ?Procedure Laterality Date  ? ABDOMINAL HYSTERECTOMY    ? CATARACT EXTRACTION    ? COLONOSCOPY N/A 12/10/2013  ? Procedure: COLONOSCOPY;  Surgeon: West Bali, MD;  [facility];  Service: Endoscopy;  Laterality: N/A;  8:30 AM  ? TOTAL KNEE ARTHROPLASTY Right 03/30/2016  ? Procedure: TOTAL KNEE ARTHROPLASTY;  Surgeon: Vickki Hearing, MD;  Location: [facility];  Service: Orthopedics;  Laterality: Right;  ? ? ?Social History  ? ?Socioeconomic History  ? Marital status: Divorced  ?  Spouse name: Not on file  ? Number of children: Not on file  ?  Years of education: Not on file  ? Highest education level: Not on file  ?Occupational History  ? Occupation: retired   ?Tobacco Use  ? Smoking status: Former  ?  Packs/day: 0.25  ?  Years: 3.00  ?  Pack years: 0.75  ?  Types: Cigarettes  ? Smokeless tobacco: Never  ?Vaping Use  ? Vaping Use: Never used  ?Substance and Sexual Activity  ? Alcohol use: No  ? Drug use: No  ? Sexual activity: Not Currently  ?  Birth control/protection: Post-menopausal  ?Other Topics Concern  ? Not on file  ?Social History Narrative  ? Long term resident of [facility]   ? ?Social Determinants of Health  ? ?Financial Resource Strain: Not on file  ?Food Insecurity:  Not on file  ?Transportation Needs: Not on file  ?Physical Activity: Not on file  ?Stress: Not on file  ?Social Connections: Not on file  ?Intimate Partner Violence: Not on file  ? ?Family History  ?Problem Relation Age of Onset  ? Cancer Mother   ? Arthritis Mother   ? Hypotension Father   ? Cancer Sister   ? Hypotension Sister   ? Colon cancer Neg Hx   ? ? ? ? ?VITAL SIGNS ?BP 122/74   Pulse 85   Temp 98 ?F (36.7 ?C)   Resp 18   Ht 5\' 5"  (1.651 m)   Wt 197 lb 6.4 oz (89.5 kg)   BMI 32.85 kg/m?  ? ?Outpatient Encounter Medications as of 10/13/2021  ?Medication Sig  ? acetaminophen (TYLENOL) 325 MG tablet Take 650 mg by mouth 2 (two) times daily.  ? apixaban (ELIQUIS) 5 MG TABS tablet Take 5 mg by mouth 2 (two) times daily.  ? ascorbic acid (VITAMIN C) 500 MG tablet Take 500 mg by mouth daily.  ? calcium citrate (CALCITRATE - DOSED IN MG ELEMENTAL CALCIUM) 950 MG tablet Take 200 mg of elemental calcium by mouth daily.  ? diclofenac Sodium (VOLTAREN) 1 % GEL Apply 4 g topically 3 (three) times daily as needed. Special Instructions: apply to right knee  ? diclofenac Sodium (VOLTAREN) 1 % GEL Apply 2 g topically 2 (two) times daily as needed. Special Instructions: apply to left index finger for arthritis pain  ? iron polysaccharides (NIFEREX) 150 MG capsule Take 1 capsule by mouth daily.  ? latanoprost (XALATAN) 0.005 % ophthalmic solution Place 1 drop into both eyes at bedtime. wait 5 minutes between multiple eye drops  ? NON FORMULARY Diet: NAS,  ? omeprazole (PRILOSEC) 20 MG capsule Take 20 mg by mouth daily. Special Instructions: *TAKE ON AN EMPTY STOMACH* FOR GERD *DO NOT CRUSH* (FORMULARY SUB FOR PANTOPRAZOLE 40MG )  ? oxybutynin (DITROPAN-XL) 10 MG 24 hr tablet Take 10 mg by mouth daily.  ? polyethylene glycol powder (GLYCOLAX/MIRALAX) 17 GM/SCOOP powder Take by mouth as needed.  ? aspirin EC 81 MG tablet Take 81 mg by mouth daily.  ? ?No facility-administered encounter medications on file as of 10/13/2021.   ? ? ? ?SIGNIFICANT DIAGNOSTIC EXAMS ? ?PREVIOUS ? ?02-05-20: DEXA: t score: -2.279 ? ?10-20-20: KUB:  There is a nonspecific bowel gas pattern without obstruction. Moderate to marked increase feces in the colon. No renal stone is seen. ? ?01-10-21: kub: significant fecal material identified.  ? ?01-17-21: chest x-ray: no evidence of acute cardiopulmonary disease; communicable  disease or tuberculosis  ? ?02-18-21: ct angio of chest:  ?1. Negative for acute PE or thoracic aortic dissection. ?2.  Aortic Atherosclerosis  ? ?02-24-21:  bilateral lower extremity venous doppler:  ?No evidence of deep venous thrombosis in either lower extremity.  ? ?02-24-21: KUB ?Moderate colonic stool burden. No evidence of bowel obstruction. ? ?05-05-21: ct angio of chest:  ?1. No CT evidence for acute pulmonary embolus. ?2. Bilateral lower lobe bronchial wall thickening with scattered areas of peripheral airway impaction. Imaging features suggest chronic infectious/inflammatory etiology. ?3. Aortic Atherosclerosis  ? ?05-19-21: right lower extremity venous doppler:  ?1. No evidence of deep vein thrombosis within the visualized veins of the right lower extremity. If there are persistent symptoms, repeat venous Doppler examination may be obtained in 3 to 5 days. ? 2. Right mid femoral and right distal femoral veins not well visualized due to body habitus and patient could not tolerate compression. ? ?NO NEW EXAMS.  ? ? ?LABS REVIEWED PREVIOUS;  ?  ?10-20-20: wbc 8.2; hgb 13.1; hct 42.8; mcv 80.0 plt 458; glucose 143; bun 17; creat 0.53; k+ 4.4; na++ 131; ca 9.1 GFR>60  liver normal albumin 3.9   ?01-17-21: wbc 4.3; hgb 10.3; hct 33.6; mcv 81.4 plt 385; glucose 105; bun 11; creat 0.50; k+ 3.5; na++ 134; ca 7.9; GFR >60; d-dimer: 5.93 CRP 5.3 ?01-24-21: d-dimer: 6.73  ?01-31-21: d-dimer: 5.30 ?02-07-21: d-dimer: 6.25 ?02-14-21: d-dimer: 12.74 ?02-24-21: d-dimer: >20 ?03-01-21: d-dimer: 14.18 ?03-10-21: d-dimer: 11.10 ?03-17-21; d-dimer: 10.44 ?03-22-21: d-dimer:  10.32  ?03-29-21: d-dimer: 10.97 ?04-11-21: d-dimer 12.12 ?04-25-21: d-dimer: 7.42   ?05-09-21: d-dimer: 5.60  ?05-19-21: d-dimer: 5.63 ?05-24-21; wbc 6.6; hgb 8.9; hct 30.4; mcv 81.1 plt 453 ;glucose 92; bun 16; c

## 2021-11-14 ENCOUNTER — Encounter: Payer: Self-pay | Admitting: Internal Medicine

## 2021-11-14 ENCOUNTER — Non-Acute Institutional Stay (SKILLED_NURSING_FACILITY): Payer: Medicare HMO | Admitting: Internal Medicine

## 2021-11-14 DIAGNOSIS — F01B0 Vascular dementia, moderate, without behavioral disturbance, psychotic disturbance, mood disturbance, and anxiety: Secondary | ICD-10-CM

## 2021-11-14 DIAGNOSIS — D649 Anemia, unspecified: Secondary | ICD-10-CM | POA: Diagnosis not present

## 2021-11-14 DIAGNOSIS — N3281 Overactive bladder: Secondary | ICD-10-CM

## 2021-11-14 DIAGNOSIS — I10 Essential (primary) hypertension: Secondary | ICD-10-CM

## 2021-11-14 NOTE — Assessment & Plan Note (Signed)
Today's blood pressure is an outlier.  Serially readings range from systolic of 104 up to 131.  No change indicated but monitor will continue to determine average. ?

## 2021-11-14 NOTE — Progress Notes (Signed)
? ?NURSING HOME LOCATION:  [facility] ?ROOM NUMBER:  105 D ? ?CODE STATUS: Full  ? ?PCP:  Synthia Innocent NP,[facility] ? ?This is a nursing facility follow up visit of chronic medical diagnoses & to document compliance with Regulation 483.30 (c) in The Long Term Care Survey Manual Phase 2 which mandates caregiver visit ( visits can alternate among physician, PA or NP as per statutes) within 10 days of 30 days / 60 days/ 90 days post admission to SNF date   ? ?Interim medical record and care since last SNF visit was updated with review of diagnostic studies and change in clinical status since last visit were documented. ? ?HPI: She is a permanent resident facility with medical diagnoses of GERD, OAB, glaucoma, diabetes with dyslipidemia & essential hypertension. ? ?Most recent labs were completed November,2022 revealing mild hypocalcemia with a value of 8.5, albumin 3.1, creatinine 0.46 with a GFR greater than 60.  Normocytic , hypochromic anemia was present with H/H of 8.9/30.4.  These values had dropped from prior values of 10.1/33.7.  A1c was 6% and TSH was therapeutic. ? ?Review of systems: Dementia invalidated responses. Date given as "3rd month,24, 2020."  She identified "[place]" as POTUS. ?She states that she has loose stool when she drinks milk or milk products.  She also has urinary frequency. ?Her main concern is that she is "ready to go home". ? ?Constitutional: No fever, significant weight change, fatigue  ?Eyes: No redness, discharge, pain, vision change ?ENT/mouth: No nasal congestion,  purulent discharge, earache, change in hearing, sore throat  ?Cardiovascular: No chest pain, palpitations, paroxysmal nocturnal dyspnea, edema  ?Respiratory: No cough, sputum production, hemoptysis, DOE, significant snoring, apnea   ?Gastrointestinal: No heartburn, dysphagia, abdominal pain, nausea /vomiting, rectal bleeding, melena ?Genitourinary: No dysuria, hematuria, pyuria ?Musculoskeletal: No joint  stiffness, joint swelling, weakness, pain ?Dermatologic: No rash, pruritus, change in appearance of skin ?Neurologic: No dizziness, headache, syncope, seizures, numbness, tingling ?Psychiatric: No significant anxiety, depression, insomnia, anorexia ?Endocrine: No change in hair/skin/nails, excessive thirst, excessive hunger ?Hematologic/lymphatic: No significant bruising, lymphadenopathy, abnormal bleeding ?Allergy/immunology: No itchy/watery eyes, significant sneezing, urticaria, angioedema ? ?Physical exam:  ?Pertinent or positive findings: She appears her stated age.  She was in the wheelchair moving to the activity room.  She did identify her correct room number but could not tell me where it was.  She is somewhat hard of hearing.  She is edentulous.  Slight tachycardia is present.  Abdomen is protuberant.  Pedal pulses are not palpable.  She has 1/2+ edema at the sock line.  She has slight contractures of the fifth fingers bilaterally.  Strength to opposition is fair.  She has faint vitiliginous spots of the dorsum of the hands. ? ?General appearance: Adequately nourished; no acute distress, increased work of breathing is present.   ?Lymphatic: No lymphadenopathy about the head, neck, axilla. ?Eyes: No conjunctival inflammation or lid edema is present. There is no scleral icterus. ?Ears:  External ear exam shows no significant lesions or deformities.   ?Nose:  External nasal examination shows no deformity or inflammation. Nasal mucosa are pink and moist without lesions, exudates ?Oral exam:  Lips and gums are healthy appearing. There is no oropharyngeal erythema or exudate. ?Neck:  No thyromegaly, masses, tenderness noted.    ?Heart:  No gallop, murmur, click, rub .  ?Lungs: Chest clear to auscultation without wheezes, rhonchi, rales, rubs. ?Abdomen: Bowel sounds are normal. Abdomen is soft and nontender with no organomegaly, hernias, masses. ?GU:  Deferred  ?Extremities:  No cyanosis, clubbing  ?Neurologic exam  :Balance, Rhomberg, finger to nose testing could not be completed due to clinical state ?Skin: Warm & dry w/o tenting. ?No significant rash. ? ?See summary under each active problem in the Problem List with associated updated therapeutic plan ? ? ?

## 2021-11-14 NOTE — Assessment & Plan Note (Signed)
No bleeding dyscrasias reported although there was a drop in H/H to 8.9/30.4 in November from prior values of 10.1/33.7. ?CBC will be monitored. ?

## 2021-11-14 NOTE — Patient Instructions (Signed)
See assessment and plan under each diagnosis in the problem list and acutely for this visit 

## 2021-11-14 NOTE — Assessment & Plan Note (Signed)
Today she gave the year as 2020 and the [place] as [place].  No behavioral issues reported. ?

## 2021-11-14 NOTE — Assessment & Plan Note (Addendum)
Despite Ditropan she continues to describe frequency.  She does not describe hematuria, dysuria or pyuria. ?This will be discussed with NP ?

## 2021-11-16 ENCOUNTER — Encounter: Payer: Self-pay | Admitting: Adult Health

## 2021-11-16 ENCOUNTER — Non-Acute Institutional Stay (SKILLED_NURSING_FACILITY): Payer: Medicare HMO | Admitting: Adult Health

## 2021-11-16 DIAGNOSIS — N3281 Overactive bladder: Secondary | ICD-10-CM | POA: Diagnosis not present

## 2021-11-16 NOTE — Progress Notes (Signed)
?Location:  [facility] ?Nursing Home Room Number: N105/D ?Place of Service:  SNF (31) ? ? ?CODE STATUS: FULL ? ?No Known Allergies ? ?Chief Complaint  ?Patient presents with  ? Acute Visit  ?  Patient is being seen for urinary frequency  ? ? ?HPI: ? ?She is presently taking ditropan for her urinary urgency. She tells me that the medication is not helping she feels like she has to urinate "all the time". There are no reports of dysuria; no back pain; no fevers.  ? ?Past Medical History:  ?Diagnosis Date  ? Abnormal posture   ? Per Zadie Cleverly, [facility] Electronic [facility] Records [facility]   ? Anemia   ? Cognitive communication deficit   ? Per Zadie Cleverly, [facility]   ? Contracture of left knee   ? Per Zadie Cleverly, [facility]   ? Diabetes mellitus without complication ([place])   ? Difficulty walking   ? Per Zadie Cleverly, [facility]   ? Extended spectrum beta lactamase (ESBL) resistance   ? Per Zadie Cleverly, [facility] Records [facility]   ? Gastroesophageal reflux disease   ? Per Zadie Cleverly, [facility]   ? Glaucoma   ? Gram negative sepsis ([place])   ? Per Zadie Cleverly, [facility]   ? Hypercholesteremia   ? Hypertension   ? Hypokalemia   ? Infection, bacterial   ? Per Matrix, [facility] Records [facility]   ? Muscle weakness (generalized)   ? Per Zadie Cleverly, [facility]   ? Need for assistance with personal care   ? Per Zadie Cleverly, [facility]   ? OAB (overactive bladder)   ? Per Zadie Cleverly, [facility]   ? Ocular hypertension, bilateral   ? Per [facility], [facility]   ? Osteoarthritis of right knee   ? Per  Zadie Cleverly, [facility] Records [facility]   ? Paraplegia ([place])   ? Pyelonephritis, acute   ? Per Zadie Cleverly, [facility]   ? Status post total right knee replacement   ? Unspecified dementia without behavioral disturbance   ? Per Zadie Cleverly, [facility] Records [facility]   ? Urgency of urination   ? Per Zadie Cleverly, [facility]   ? Wheelchair dependence   ? Per Zadie Cleverly, [facility]   ? ? ?Past Surgical History:  ?Procedure Laterality Date  ? ABDOMINAL HYSTERECTOMY    ? CATARACT EXTRACTION    ? COLONOSCOPY N/A 12/10/2013  ? Procedure: COLONOSCOPY;  Surgeon: Danie Binder, MD;  [facility]: [facility];  Service: Endoscopy;  Laterality: N/A;  8:30 AM  ? TOTAL KNEE ARTHROPLASTY Right 03/30/2016  ? Procedure: TOTAL KNEE ARTHROPLASTY;  Surgeon: Carole Civil, MD;  Location: [facility];  Service: Orthopedics;  Laterality: Right;  ? ? ?Social History  ? ?Socioeconomic History  ? Marital status: Divorced  ?  Spouse name: Not on file  ? Number of children: Not on file  ? Years of education: Not on file  ? Highest education level: Not on file  ?Occupational History  ? Occupation: retired   ?Tobacco Use  ? Smoking status: Former  ?  Packs/day: 0.25  ?  Years: 3.00  ?  Pack years: 0.75  ?  Types: Cigarettes  ? Smokeless tobacco: Never  ?Vaping Use  ? Vaping Use: Never used  ?Substance and Sexual Activity  ? Alcohol use: No  ? Drug use: No  ? Sexual activity: Not Currently  ?  Birth control/protection: Post-menopausal  ?Other Topics Concern  ? Not on file  ?Social History Narrative  ? Long term resident of [facility]   ? ?Social Determinants of Health  ? ?Financial Resource Strain: Not on file  ?Food Insecurity: Not on file  ?Transportation Needs: Not on file  ?Physical Activity: Not on file  ?Stress: Not on file  ?Social Connections: Not on file  ?Intimate Partner Violence:  Not on file  ? ?Family History  ?Problem Relation Age of Onset  ? Cancer Mother   ? Arthritis Mother   ? Hypotension Father   ? Cancer Sister   ? Hypotension Sister   ? Colon cancer Neg Hx   ? ? ? ? ?VITAL SIGNS ?BP (!) 148/72   Pulse 86   Temp (!) 97 ?F (36.1 ?C)   Resp 18   Ht 5' (1.524 m)   Wt 196 lb 12.8 oz (89.3 kg)   SpO2 90%   BMI 38.43 kg/m?  ? ?Outpatient Encounter Medications as of 11/16/2021  ?Medication Sig  ? acetaminophen (TYLENOL) 325 MG tablet Take 650 mg by mouth 2 (two) times daily.  ? apixaban (ELIQUIS) 5 MG TABS tablet Take 5 mg by mouth 2 (two) times daily.  ? ascorbic acid (VITAMIN C) 500 MG tablet Take 500 mg by mouth daily.  ? aspirin EC 81 MG tablet Take 81 mg by mouth daily.  ? calcium citrate (CALCITRATE - DOSED IN MG ELEMENTAL CALCIUM) 950 MG tablet Take 200 mg of elemental calcium by mouth daily.  ? diclofenac Sodium (VOLTAREN) 1 % GEL Apply 4 g topically 3 (three) times daily as needed. Special Instructions: apply to right knee  ? diclofenac Sodium (VOLTAREN) 1 % GEL Apply 2 g topically 2 (two) times daily as needed. Special Instructions: apply to left index finger for arthritis pain  ? iron polysaccharides (NIFEREX) 150 MG capsule Take 1 capsule by mouth daily.  ? latanoprost (XALATAN) 0.005 % ophthalmic solution Place 1 drop into both eyes at bedtime. wait 5 minutes between multiple eye drops  ? NON FORMULARY Diet: NAS,  ? omeprazole (PRILOSEC) 20 MG capsule Take 20 mg by mouth daily. Special Instructions: *TAKE ON AN EMPTY STOMACH* FOR GERD *DO NOT CRUSH* (FORMULARY SUB FOR PANTOPRAZOLE 40MG )  ? oxybutynin (DITROPAN-XL) 10 MG 24 hr tablet Take 10 mg by mouth daily.  ? polyethylene glycol powder (GLYCOLAX/MIRALAX) 17 GM/SCOOP powder Take by mouth as needed.  ? ?No facility-administered encounter medications on file as of 11/16/2021.  ? ? ? ?SIGNIFICANT DIAGNOSTIC EXAMS ? ? ?PREVIOUS ? ?02-05-20: DEXA: t score: -2.279 ? ?01-10-21: kub: significant fecal material identified.   ? ?01-17-21: chest x-ray: no evidence of acute cardiopulmonary disease; communicable  disease or tuberculosis  ? ?02-18-21: ct angio of chest:  ?1. Negative for acute PE or thoracic aortic dissection. ?2.  Aortic Atherosclerosis  ? ?02-24-21: bilateral lower extremity venous doppler:  ?No evidence of deep venous thrombosis in either lower extremity.  ? ?02-24-21: KUB ?Moderate colonic stool burden. No evidence of bowel obstruction. ? ?05-05-21: ct angio of chest:  ?1. No CT evidence for acute pulmonary embolus. ?2. Bilateral lower lobe bronchial wall thickening with scattered areas of peripheral airway impaction.  Imaging features suggest chronic infectious/inflammatory etiology. ?3. Aortic Atherosclerosis  ? ?05-19-21: right lower extremity venous doppler:  ?1. No evidence of deep vein thrombosis within the visualized veins of the right lower extremity. If there are persistent symptoms, repeat venous Doppler examination may be obtained in 3 to 5 days. ? 2. Right mid femoral and right distal femoral veins not well visualized due to body habitus and patient could not tolerate compression. ? ?NO NEW EXAMS.  ? ? ?LABS REVIEWED PREVIOUS;  ?   ?01-17-21: wbc 4.3; hgb 10.3; hct 33.6; mcv 81.4 plt 385; glucose 105; bun 11; creat 0.50; k+ 3.5; na++ 134; ca 7.9; GFR >60; d-dimer: 5.93 CRP 5.3 ?01-24-21: d-dimer: 6.73  ?01-31-21: d-dimer: 5.30 ?02-07-21: d-dimer: 6.25 ?02-14-21: d-dimer: 12.74 ?02-24-21: d-dimer: >20 ?03-01-21: d-dimer: 14.18 ?03-10-21: d-dimer: 11.10 ?03-17-21; d-dimer: 10.44 ?03-22-21: d-dimer: 10.32  ?03-29-21: d-dimer: 10.97 ?04-11-21: d-dimer 12.12 ?04-25-21: d-dimer: 7.42   ?05-09-21: d-dimer: 5.60  ?05-19-21: d-dimer: 5.63 ?05-24-21; wbc 6.6; hgb 8.9; hct 30.4; mcv 81.1 plt 453 ;glucose 92; bun 16; creat 0.46; k+ 3.8; na++ 137; ca 8.5; GFR >60; liver normal albumin 3.1  ?05-27-21: guaiac negative ?06-03-21: d-dimer: 6.31  ?06-20-21: wbc 6.3; hgb 9,3; hct 30.8; mcv 79.0 plt 431; tsh 1.144 hgb a1c 6.0  d-dimer 7.14 ?12/2;  06/29/21: guaiac +  ?07-04-21 d-dimer: 4.35 ?08-26-21; wbc 5.8; hgb 9.9; hct 32.9; mcv 77.4 plt 410; d-dimer 13.43; factor 5: 114 (n); factor 8: 233 (n) ?Fibrinogen 477 (ele) INR 1.4; folate 16.5; vitamin B 12: 314; ferritin

## 2021-11-18 ENCOUNTER — Encounter: Payer: Self-pay | Admitting: Adult Health

## 2021-11-18 ENCOUNTER — Non-Acute Institutional Stay (SKILLED_NURSING_FACILITY): Payer: Medicare HMO | Admitting: Adult Health

## 2021-11-18 DIAGNOSIS — E1322 Other specified diabetes mellitus with diabetic chronic kidney disease: Secondary | ICD-10-CM

## 2021-11-18 DIAGNOSIS — I129 Hypertensive chronic kidney disease with stage 1 through stage 4 chronic kidney disease, or unspecified chronic kidney disease: Secondary | ICD-10-CM

## 2021-11-18 DIAGNOSIS — I700 Atherosclerosis of aorta: Secondary | ICD-10-CM

## 2021-11-18 DIAGNOSIS — F01B0 Vascular dementia, moderate, without behavioral disturbance, psychotic disturbance, mood disturbance, and anxiety: Secondary | ICD-10-CM

## 2021-11-18 DIAGNOSIS — N182 Chronic kidney disease, stage 2 (mild): Secondary | ICD-10-CM

## 2021-11-18 NOTE — Progress Notes (Signed)
?Location:  [place] ?Nursing Home Room Number: 105-D ?Place of Service:  SNF (31) ? ? ?CODE STATUS: Full Code ? ?No Known Allergies ? ?Chief Complaint  ?Patient presents with  ? Acute Visit  ?  Care plan meeting  ? ? ?HPI: ? ?We have come together for her care plan meeting. BIMS 10/15 mood 1/30: nervous at times. She is nonambulatory with no falls. She requires extensive to dependent for her adls. She is incontinent of bladder and bowel. Dietary: weight 196.8 pounds; NAD diet good appetite; feeds self. Therapy: none at this time. 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 ?Past Medical History:  ?Diagnosis Date  ? Abnormal posture   ? Per Zadie Cleverly, [facility] Records [facility]   ? Anemia   ? Cognitive communication deficit   ? Per Zadie Cleverly, [facility]   ? Contracture of left knee   ? Per Zadie Cleverly, [facility]   ? Diabetes mellitus without complication ([place])   ? Difficulty walking   ? Per Zadie Cleverly, [facility]   ? Extended spectrum beta lactamase (ESBL) resistance   ? Per Zadie Cleverly, [facility]   ? Gastroesophageal reflux disease   ? Per Zadie Cleverly, [facility]   ? Glaucoma   ? Gram negative sepsis ([place])   ? Per Zadie Cleverly, [facility]   ? Hypercholesteremia   ? Hypertension   ? Hypokalemia   ? Infection, bacterial   ? Per [facility], [facility] Records [facility]   ? Muscle weakness (generalized)   ? Per Zadie Cleverly, [facility]   ? Need for assistance with personal care   ?  Per Zadie Cleverly, [facility]   ? OAB (overactive bladder)   ? Per Zadie Cleverly, [facility]   ? Ocular hypertension, bilateral   ? Per [facility], [facility]   ? Osteoarthritis of right knee   ? Per Zadie Cleverly, [facility]   ? Paraplegia ([place])   ? Pyelonephritis, acute   ? Per Zadie Cleverly, [facility]   ? Status post total right knee replacement   ? Unspecified dementia without behavioral disturbance   ? Per Zadie Cleverly, [facility]   ? Urgency of urination   ? Per Zadie Cleverly, [facility]   ? Wheelchair dependence   ? Per Zadie Cleverly, [facility]   ? ? ?Past Surgical History:  ?Procedure Laterality Date  ? ABDOMINAL HYSTERECTOMY    ? CATARACT EXTRACTION    ? COLONOSCOPY N/A 12/10/2013  ? Procedure: COLONOSCOPY;  Surgeon: Danie Binder, MD;  [facility];  Service: Endoscopy;  Laterality: N/A;  8:30 AM  ? TOTAL KNEE ARTHROPLASTY Right 03/30/2016  ? Procedure: TOTAL KNEE ARTHROPLASTY;  Surgeon: Carole Civil, MD;  Location: [facility];  Service: Orthopedics;  Laterality: Right;  ? ? ?Social History  ? ?Socioeconomic History  ? Marital status: Divorced  ?  Spouse name: Not on file  ? Number  of children: Not on file  ? Years of education: Not on file  ? Highest education level: Not on file  ?Occupational History  ? Occupation: retired   ?Tobacco Use  ? Smoking status: Former  ?  Packs/day: 0.25  ?  Years: 3.00  ?  Pack years: 0.75  ?  Types: Cigarettes  ? Smokeless tobacco: Never  ?Vaping Use  ? Vaping Use: Never used  ?Substance and Sexual Activity  ? Alcohol use: No  ? Drug use: No  ? Sexual activity: Not Currently  ?  Birth control/protection: Post-menopausal  ?Other Topics Concern  ? Not on file   ?Social History Narrative  ? Long term resident of [facility]   ? ?Social Determinants of Health  ? ?Financial Resource Strain: Not on file  ?Food Insecurity: Not on file  ?Transportation Needs: Not on file  ?Physical Activity: Not on file  ?Stress: Not on file  ?Social Connections: Not on file  ?Intimate Partner Violence: Not on file  ? ?Family History  ?Problem Relation Age of Onset  ? Cancer Mother   ? Arthritis Mother   ? Hypotension Father   ? Cancer Sister   ? Hypotension Sister   ? Colon cancer Neg Hx   ? ? ? ? ?VITAL SIGNS ?BP (!) 148/72   Pulse 86   Temp 98.4 ?F (36.9 ?C)   Resp 18   Ht 5' (1.524 m)   Wt 196 lb 12.8 oz (89.3 kg)   BMI 38.43 kg/m?  ? ?Outpatient Encounter Medications as of 11/18/2021  ?Medication Sig  ? acetaminophen (TYLENOL) 325 MG tablet Take 650 mg by mouth 2 (two) times daily.  ? apixaban (ELIQUIS) 5 MG TABS tablet Take 5 mg by mouth 2 (two) times daily.  ? ascorbic acid (VITAMIN C) 500 MG tablet Take 500 mg by mouth daily.  ? calcium citrate (CALCITRATE - DOSED IN MG ELEMENTAL CALCIUM) 950 MG tablet Take 200 mg of elemental calcium by mouth daily.  ? diclofenac Sodium (VOLTAREN) 1 % GEL Apply 4 g topically 3 (three) times daily as needed. Special Instructions: apply to right knee  ? diclofenac Sodium (VOLTAREN) 1 % GEL Apply 2 g topically 2 (two) times daily as needed. Special Instructions: apply to left index finger for arthritis pain  ? iron polysaccharides (NIFEREX) 150 MG capsule Take 1 capsule by mouth daily.  ? latanoprost (XALATAN) 0.005 % ophthalmic solution Place 1 drop into both eyes at bedtime. wait 5 minutes between multiple eye drops  ? mirabegron ER (MYRBETRIQ) 25 MG TB24 tablet Take 25 mg by mouth daily.  ? NON FORMULARY Diet: NAS,  ? omeprazole (PRILOSEC) 20 MG capsule Take 20 mg by mouth daily. Special Instructions: *TAKE ON AN EMPTY STOMACH* FOR GERD *DO NOT CRUSH* (FORMULARY SUB FOR PANTOPRAZOLE 40MG )  ? polyethylene glycol powder (GLYCOLAX/MIRALAX) 17 GM/SCOOP powder  Take by mouth as needed.  ? [DISCONTINUED] aspirin EC 81 MG tablet Take 81 mg by mouth daily.  ? [DISCONTINUED] oxybutynin (DITROPAN-XL) 10 MG 24 hr tablet Take 10 mg by mouth daily.  ? ?No facility-administered encounter medications on file as of 11/18/2021.  ? ? ? ?SIGNIFICANT DIAGNOSTIC EXAMS ? ?PREVIOUS ? ?02-05-20: DEXA: t score: -2.279 ? ?01-10-21: kub: significant fecal material identified.  ? ?01-17-21: chest x-ray: no evidence of acute cardiopulmonary disease; communicable  disease or tuberculosis  ? ?02-18-21: ct angio of chest:  ?1. Negative for acute PE or thoracic aortic dissection. ?2.  Aortic Atherosclerosis  ? ?02-24-21: bilateral lower  extremity venous doppler:  ?No evidence of deep venous thrombosis in either lower extremity.  ? ?02-24-21: KUB ?Moderate colonic stool burden. No evidence of bowel obstruction. ? ?05-05-21: ct angio of chest:  ?1. No CT evidence for acute pulmonary embolus. ?2. Bilateral lower lobe bronchial wall thickening with scattered areas of peripheral airway impaction. Imaging features suggest chronic infectious/inflammatory etiology. ?3. Aortic Atherosclerosis  ? ?05-19-21: right lower extremity venous doppler:  ?1. No evidence of deep vein thrombosis within the visualized veins of the right lower extremity. If there are persistent symptoms, repeat venous Doppler examination may be obtained in 3 to 5 days. ? 2. Right mid femoral and right distal femoral veins not well visualized due to body habitus and patient could not tolerate compression. ? ?NO NEW EXAMS.  ? ? ?LABS REVIEWED PREVIOUS;  ?   ?01-17-21: wbc 4.3; hgb 10.3; hct 33.6; mcv 81.4 plt 385; glucose 105; bun 11; creat 0.50; k+ 3.5; na++ 134; ca 7.9; GFR >60; d-dimer: 5.93 CRP 5.3 ?01-24-21: d-dimer: 6.73  ?01-31-21: d-dimer: 5.30 ?02-07-21: d-dimer: 6.25 ?02-14-21: d-dimer: 12.74 ?02-24-21: d-dimer: >20 ?03-01-21: d-dimer: 14.18 ?03-10-21: d-dimer: 11.10 ?03-17-21; d-dimer: 10.44 ?03-22-21: d-dimer: 10.32  ?03-29-21: d-dimer: 10.97 ?04-11-21:  d-dimer 12.12 ?04-25-21: d-dimer: 7.42   ?05-09-21: d-dimer: 5.60  ?05-19-21: d-dimer: 5.63 ?05-24-21; wbc 6.6; hgb 8.9; hct 30.4; mcv 81.1 plt 453 ;glucose 92; bun 16; creat 0.46; k+ 3.8; na++ 137; ca 8.5;

## 2021-11-21 ENCOUNTER — Encounter: Payer: Self-pay | Admitting: Adult Health

## 2021-11-21 ENCOUNTER — Non-Acute Institutional Stay (SKILLED_NURSING_FACILITY): Payer: Medicare HMO | Admitting: Adult Health

## 2021-11-21 ENCOUNTER — Other Ambulatory Visit (HOSPITAL_COMMUNITY)
Admission: RE | Admit: 2021-11-21 | Discharge: 2021-11-21 | Disposition: A | Discharge status: Home / Self Care | Payer: Medicare HMO | Source: Skilled Nursing Facility | Attending: Adult Health | Admitting: Adult Health

## 2021-11-21 DIAGNOSIS — U071 COVID-19: Secondary | ICD-10-CM

## 2021-11-21 DIAGNOSIS — D6869 Other thrombophilia: Secondary | ICD-10-CM | POA: Diagnosis not present

## 2021-11-21 DIAGNOSIS — Z7901 Long term (current) use of anticoagulants: Secondary | ICD-10-CM | POA: Diagnosis present

## 2021-11-21 LAB — D-DIMER, QUANTITATIVE: D-Dimer, Quant: >20 ug/mL-FEU — ABNORMAL HIGH (ref 0.00–0.50)

## 2021-11-21 NOTE — Progress Notes (Signed)
?Location:  [facility] ?Nursing Home Room Number: [id number] ?Place of Service:  SNF (31) ? ? ?CODE STATUS: Full Code ? ?No Known Allergies ? ?Chief Complaint  ?Patient presents with  ? Acute Visit  ?  Lab Follow up  ? ? ?HPI: ? ?Her d-dimer is more elevated at >20. She continues on eliquis 5 mg twice daily. There are no indications of dvt or pe. She denies any chest pain or shortness of breath. No reports of lower extremity pain.  ?She will need a follow up with hematology.  ? ?Past Medical History:  ?Diagnosis Date  ? Abnormal posture   ? Per Ria BushMatrix, [facility]'s Electronic Medical Records System   ? Anemia   ? Cognitive communication deficit   ? Per Ria Bush[facility], [facility] Electronic Medical Records System   ? Contracture of left knee   ? Per Ria Bush[facility], [facility] Electronic Medical Records System   ? Diabetes mellitus without complication (HCC)   ? Difficulty walking   ? Per Ria Bush[employer], [facility] Electronic Medical Records System   ? Extended spectrum beta lactamase (ESBL) resistance   ? Per Ria Bush[employer], [facility] Electronic Medical Records System   ? Gastroesophageal reflux disease   ? Per Ria Bush[facility], [facility] Electronic Medical Records System   ? Glaucoma   ? Gram negative sepsis (HCC)   ? Per Ria Bush[facility], [facility] Electronic Medical Records System   ? Hypercholesteremia   ? Hypertension   ? Hypokalemia   ? Infection, bacterial   ? Per [facility], [facility] Electronic Medical Records System   ? Muscle weakness (generalized)   ? Per Ria Bush[facility], [facility] Electronic Medical Records System   ? Need for assistance with personal care   ? Per Ria Bush[facility], [facility] Electronic Medical Records System   ? OAB (overactive bladder)   ? Per Ria Bush[facility], [facility] Electronic Medical Records System   ? Ocular hypertension, bilateral   ? Per [facility], [facility] Electronic Medical Records System   ? Osteoarthritis of right  knee   ? Per Ria Bush[facility], [facility] Electronic Medical Records System   ? Paraplegia (HCC)   ? Pyelonephritis, acute   ? Per Ria Bush[facility], [facility] Electronic Medical Records System   ? Status post total right knee replacement   ? Unspecified dementia without behavioral disturbance   ? Per Ria Bush[facility], [facility] Electronic Medical Records System   ? Urgency of urination   ? Per Ria Bush[facility], [facility] Electronic Medical Records System   ? Wheelchair dependence   ? Per Ria Bush[employer], [facility] Electronic Medical Records System   ? ? ?Past Surgical History:  ?Procedure Laterality Date  ? ABDOMINAL HYSTERECTOMY    ? CATARACT EXTRACTION    ? COLONOSCOPY N/A 12/10/2013  ? Procedure: COLONOSCOPY;  Surgeon: West BaliSandi L Fields, MD;  [facility];  Service: Endoscopy;  Laterality: N/A;  8:30 AM  ? TOTAL KNEE ARTHROPLASTY Right 03/30/2016  ? Procedure: TOTAL KNEE ARTHROPLASTY;  Surgeon: Vickki HearingStanley E Harrison, MD;  Location: [facility];  Service: Orthopedics;  Laterality: Right;  ? ? ?Social History  ? ?Socioeconomic History  ? Marital status: Divorced  ?  Spouse name: Not on file  ? Number of children: Not on file  ? Years of education: Not on file  ? Highest education level: Not on file  ?Occupational History  ? Occupation: retired   ?Tobacco Use  ? Smoking status: Former  ?  Packs/day: 0.25  ?  Years: 3.00  ?  Pack years: 0.75  ?  Types: Cigarettes  ? Smokeless tobacco: Never  ?Vaping Use  ? Vaping Use: Never used  ?Substance and Sexual Activity  ? Alcohol use: No  ? Drug use: No  ? Sexual activity: Not Currently  ?  Birth control/protection: Post-menopausal  ?Other Topics Concern  ? Not on file  ?Social History Narrative  ? Long term resident of [facility]   ? ?Social Determinants of Health  ? ?Financial Resource Strain: Not on file  ?Food Insecurity: Not on file  ?Transportation Needs: Not on file  ?Physical Activity: Not on file  ?Stress: Not on file  ?Social Connections: Not on file  ?Intimate  Partner Violence: Not on file  ? ?Family History  ?Problem Relation Age of Onset  ? Cancer Mother   ? Arthritis Mother   ? Hypotension Father   ? Cancer Sister   ? Hypotension Sister   ? Colon cancer Neg Hx   ? ? ? ? ?VITAL SIGNS ?BP 129/83   Pulse 75   Temp 98.6 ?F (37 ?C) (Skin)   Ht 5\' 5"  (1.651 m)   Wt 196 lb 12.8 oz (89.3 kg)   BMI 32.75 kg/m?  ? ?Outpatient Encounter Medications as of 11/21/2021  ?Medication Sig  ? acetaminophen (TYLENOL) 325 MG tablet Take 650 mg by mouth 2 (two) times daily.  ? apixaban (ELIQUIS) 5 MG TABS tablet Take 5 mg by mouth 2 (two) times daily.  ? ascorbic acid (VITAMIN C) 500 MG tablet Take 500 mg by mouth daily.  ? calcium citrate (CALCITRATE - DOSED IN MG ELEMENTAL CALCIUM) 950 MG tablet Take 200 mg of elemental calcium by mouth daily.  ? diclofenac Sodium (VOLTAREN) 1 % GEL Apply 4 g topically 3 (three) times daily as needed. Special Instructions: apply to right knee  ? diclofenac Sodium (VOLTAREN) 1 % GEL Apply 2 g topically 2 (two) times daily as needed. Special Instructions: apply to left index finger for arthritis pain  ? iron polysaccharides (NIFEREX) 150 MG capsule Take 1 capsule by mouth daily.  ? latanoprost (XALATAN) 0.005 % ophthalmic solution Place 1 drop into both eyes at bedtime. wait 5 minutes between multiple eye drops  ? mirabegron ER (MYRBETRIQ) 25 MG TB24 tablet Take 25 mg by mouth daily.  ? NON FORMULARY Diet: NAS,  ? omeprazole (PRILOSEC) 20 MG capsule Take 20 mg by mouth 2 (two) times daily before a meal. Special Instructions: *TAKE ON AN EMPTY STOMACH* FOR GERD *DO NOT CRUSH* (FORMULARY SUB FOR PANTOPRAZOLE 40MG )  ? polyethylene glycol powder (GLYCOLAX/MIRALAX) 17 GM/SCOOP powder Take by mouth daily as needed.  ? ?No facility-administered encounter medications on file as of 11/21/2021.  ? ? ? ?SIGNIFICANT DIAGNOSTIC EXAMS ? ?PREVIOUS ? ?02-05-20: DEXA: t score: -2.279 ? ?01-10-21: kub: significant fecal material identified.  ? ?01-17-21: chest x-ray: no  evidence of acute cardiopulmonary disease; communicable  disease or tuberculosis  ? ?02-18-21: ct angio of chest:  ?1. Negative for acute PE or thoracic aortic dissection. ?2.  Aortic Atherosclerosis  ? ?02-24-21: bilateral lower extremity venous doppler:  ?No evidence of deep venous thrombosis in either lower extremity.  ? ?02-24-21: KUB ?Moderate colonic stool burden. No evidence of bowel obstruction. ? ?05-05-21: ct angio of chest:  ?1. No CT evidence for acute pulmonary embolus. ?2. Bilateral lower lobe bronchial wall thickening with scattered areas of peripheral airway impaction. Imaging features suggest chronic infectious/inflammatory etiology. ?3. Aortic Atherosclerosis  ? ?  05-19-21: right lower extremity venous doppler:  ?1. No evidence of deep vein thrombosis within the visualized veins of the right lower extremity. If there are persistent symptoms, repeat venous Doppler examination may be obtained in 3 to 5 days. ? 2. Right mid femoral and right distal femoral veins not well visualized due to body habitus and patient could not tolerate compression. ? ?NO NEW EXAMS.  ? ? ?LABS REVIEWED PREVIOUS;  ?   ?01-17-21: wbc 4.3; hgb 10.3; hct 33.6; mcv 81.4 plt 385; glucose 105; bun 11; creat 0.50; k+ 3.5; na++ 134; ca 7.9; GFR >60; d-dimer: 5.93 CRP 5.3 ?01-24-21: d-dimer: 6.73  ?01-31-21: d-dimer: 5.30 ?02-07-21: d-dimer: 6.25 ?02-14-21: d-dimer: 12.74 ?02-24-21: d-dimer: >20 ?03-01-21: d-dimer: 14.18 ?03-10-21: d-dimer: 11.10 ?03-17-21; d-dimer: 10.44 ?03-22-21: d-dimer: 10.32  ?03-29-21: d-dimer: 10.97 ?04-11-21: d-dimer 12.12 ?04-25-21: d-dimer: 7.42   ?05-09-21: d-dimer: 5.60  ?05-19-21: d-dimer: 5.63 ?05-24-21; wbc 6.6; hgb 8.9; hct 30.4; mcv 81.1 plt 453 ;glucose 92; bun 16; creat 0.46; k+ 3.8; na++ 137; ca 8.5; GFR >60; liver normal albumin 3.1  ?05-27-21: guaiac negative ?06-03-21: d-dimer: 6.31  ?06-20-21: wbc 6.3; hgb 9,3; hct 30.8; mcv 79.0 plt 431; tsh 1.144 hgb a1c 6.0  d-dimer 7.14 ?12/2; 06/29/21: guaiac +  ?07-04-21  d-dimer: 4.35 ?08-26-21; wbc 5.8; hgb 9.9; hct 32.9; mcv 77.4 plt 410; d-dimer 13.43; factor 5: 114 (n); factor 8: 233 (n) ?Fibrinogen 477 (ele) INR 1.4; folate 16.5; vitamin B 12: 314; ferritin 10; iron 18; tibc 361;  ? ?T

## 2021-11-22 NOTE — Progress Notes (Signed)
? ?[facility] ?[address] ?[place], [place] [address] ? ? ?CLINIC:  ?Medical Oncology/Hematology ? ?PCP:  ?Gerlene Fee, NP ?[address] [place] [address] ?[phone] ? ? ?REASON FOR VISIT:  ?Follow-up for elevated D-dimer ? ?CURRENT THERAPY: Eliquis 5 mg twice daily ? ?INTERVAL HISTORY:  ?[name] 87 y.o. female returns for routine follow-up of her elevated D-dimer.  She was seen for initial consultation by Dr. Delton Coombes on 08/26/2021.  She was scheduled for follow-up visit, but missed her appointment and was sent back to [place] today by her PCP due to persistently elevated D-dimer. ? ?At today's visit, she reports feeling fairly well.  No recent hospitalizations, surgeries, or changes in baseline health status. She does not have any current signs of DVT or PE.  She denies any unilateral leg swelling, pain, and erythema.  She has not noticed any new shortness of breath, dyspnea on exertion, chest pain, cough, hemoptysis, and palpitations.  She has not had any recent infections, surgeries, hospitalizations ? ?She remains on Eliquis at this time.  She reports that she has soft, black-colored bowel movements, which she thinks may be from taking her iron pill.  She has mild fatigue.  She denies any pica, restless legs, headaches, chest pain, dyspnea on exertion, lightheadedness, or syncope. ? ?She has 75% energy and 100% appetite. She endorses that she is maintaining a stable weight. ? ? ?REVIEW OF SYSTEMS:  ?Review of Systems  ?Constitutional:  Positive for fatigue. Negative for appetite change, chills, diaphoresis, fever and unexpected weight change.  ?HENT:   Negative for lump/mass and nosebleeds.   ?Eyes:  Negative for eye problems.  ?Respiratory:  Negative for cough, hemoptysis and shortness of breath.   ?Cardiovascular:  Negative for chest pain, leg swelling and palpitations.  ?Gastrointestinal:  Negative for abdominal pain, blood in stool, constipation, diarrhea, nausea and vomiting.   ?Genitourinary:  Positive for frequency. Negative for hematuria.   ?Skin: Negative.   ?Neurological:  Negative for dizziness, headaches and light-headedness.  ?Hematological:  Does not bruise/bleed easily.  ?Psychiatric/Behavioral:  Positive for depression. The patient is nervous/anxious.    ? ? ?PAST MEDICAL/SURGICAL HISTORY:  ?Past Medical History:  ?Diagnosis Date  ? Abnormal posture   ? Per Zadie Cleverly, [facility]'s Electronic Medical Records System   ? Anemia   ? Cognitive communication deficit   ? Per Zadie Cleverly, [facility] Electronic Medical Records System   ? Contracture of left knee   ? Per Zadie Cleverly, [facility] Electronic Medical Records System   ? Diabetes mellitus without complication ([place])   ? Difficulty walking   ? Per Zadie Cleverly, [facility] Electronic Medical Records System   ? Extended spectrum beta lactamase (ESBL) resistance   ? Per Zadie Cleverly, [facility] Electronic Medical Records System   ? Gastroesophageal reflux disease   ? Per Zadie Cleverly, [facility] Electronic Medical Records System   ? Glaucoma   ? Gram negative sepsis ([place])   ? Per Zadie Cleverly, [facility] Electronic Medical Records System   ? Hypercholesteremia   ? Hypertension   ? Hypokalemia   ? Infection, bacterial   ? Per [employer], [facility]'s Electronic Medical Records System   ? Muscle weakness (generalized)   ? Per Zadie Cleverly, [facility] Electronic Medical Records System   ? Need for assistance with personal care   ? Per Zadie Cleverly, [facility] Electronic Medical Records System   ? OAB (overactive bladder)   ?  Per Zadie Cleverly, [facility] Electronic Medical Records System   ? Ocular hypertension, bilateral   ? Per Matrix, [facility]'s Electronic Medical Records System   ? Osteoarthritis of right knee   ? Per Zadie Cleverly, [facility] Electronic Medical Records System   ? Paraplegia ([place])   ? Pyelonephritis, acute   ? Per Zadie Cleverly, [facility]'s  Electronic Medical Records System   ? Status post total right knee replacement   ? Unspecified dementia without behavioral disturbance   ? Per Zadie Cleverly, [facility] Electronic Medical Records System   ? Urgency of urination   ? Per Zadie Cleverly, [facility]'s Electronic Medical Records System   ? Wheelchair dependence   ? Per Zadie Cleverly, [facility]'s Electronic Medical Records System   ? ?Past Surgical History:  ?Procedure Laterality Date  ? ABDOMINAL HYSTERECTOMY    ? CATARACT EXTRACTION    ? COLONOSCOPY N/A 12/10/2013  ? Procedure: COLONOSCOPY;  Surgeon: Danie Binder, MD;  [facility];  Service: Endoscopy;  Laterality: N/A;  8:30 AM  ? TOTAL KNEE ARTHROPLASTY Right 03/30/2016  ? Procedure: TOTAL KNEE ARTHROPLASTY;  Surgeon: Carole Civil, MD;  Location: [facility];  Service: Orthopedics;  Laterality: Right;  ? ? ? ?SOCIAL HISTORY:  ?Social History  ? ?Socioeconomic History  ? Marital status: Divorced  ?  Spouse name: Not on file  ? Number of children: Not on file  ? Years of education: Not on file  ? Highest education level: Not on file  ?Occupational History  ? Occupation: retired   ?Tobacco Use  ? Smoking status: Former  ?  Packs/day: 0.25  ?  Years: 3.00  ?  Pack years: 0.75  ?  Types: Cigarettes  ? Smokeless tobacco: Never  ?Vaping Use  ? Vaping Use: Never used  ?Substance and Sexual Activity  ? Alcohol use: No  ? Drug use: No  ? Sexual activity: Not Currently  ?  Birth control/protection: Post-menopausal  ?Other Topics Concern  ? Not on file  ?Social History Narrative  ? Long term resident of [facility]   ? ?Social Determinants of Health  ? ?Financial Resource Strain: Not on file  ?Food Insecurity: Not on file  ?Transportation Needs: Not on file  ?Physical Activity: Not on file  ?Stress: Not on file  ?Social Connections: Not on file  ?Intimate Partner Violence: Not on file  ? ? ?FAMILY HISTORY:  ?Family History  ?Problem Relation Age of Onset  ? Cancer Mother   ? Arthritis Mother   ?  Hypotension Father   ? Cancer Sister   ? Hypotension Sister   ? Colon cancer Neg Hx   ? ? ?CURRENT MEDICATIONS:  ?Outpatient Encounter Medications as of 11/23/2021  ?Medication Sig  ? acetaminophen (TYLENOL) 325 MG tablet Take 650 mg by mouth 2 (two) times daily.  ? apixaban (ELIQUIS) 5 MG TABS tablet Take 5 mg by mouth 2 (two) times daily.  ? ascorbic acid (VITAMIN C) 500 MG tablet Take 500 mg by mouth daily.  ? calcium citrate (CALCITRATE - DOSED IN MG ELEMENTAL CALCIUM) 950 MG tablet Take 200 mg of elemental calcium by mouth daily.  ? diclofenac Sodium (VOLTAREN) 1 % GEL Apply 4 g topically 3 (three) times daily as needed. Special Instructions: apply to right knee  ? diclofenac Sodium (VOLTAREN) 1 % GEL Apply 2 g topically 2 (two) times daily as needed. Special Instructions: apply to left index finger for arthritis pain  ? iron  polysaccharides (NIFEREX) 150 MG capsule Take 1 capsule by mouth daily.  ? latanoprost (XALATAN) 0.005 % ophthalmic solution Place 1 drop into both eyes at bedtime. wait 5 minutes between multiple eye drops  ? mirabegron ER (MYRBETRIQ) 25 MG TB24 tablet Take 25 mg by mouth daily.  ? NON FORMULARY Diet: NAS,  ? omeprazole (PRILOSEC) 20 MG capsule Take 20 mg by mouth 2 (two) times daily before a meal. Special Instructions: *TAKE ON AN EMPTY STOMACH* FOR GERD *DO NOT CRUSH* (FORMULARY SUB FOR PANTOPRAZOLE 40MG )  ? polyethylene glycol powder (GLYCOLAX/MIRALAX) 17 GM/SCOOP powder Take by mouth daily as needed.  ? ?No facility-administered encounter medications on file as of 11/23/2021.  ? ? ?ALLERGIES:  ?No Known Allergies ? ? ?PHYSICAL EXAM:  ?ECOG PERFORMANCE STATUS: 3 - Symptomatic, >50% confined to bed ? ?There were no vitals filed for this visit. ?There were no vitals filed for this visit. ?Physical Exam ?Constitutional:   ?   Appearance: Normal appearance. She is obese.  ?   Comments: Presents in wheelchair  ?HENT:  ?   Head: Normocephalic and atraumatic.  ?   Mouth/Throat:  ?   Mouth: Mucous  membranes are moist.  ?Eyes:  ?   Extraocular Movements: Extraocular movements intact.  ?   Pupils: Pupils are equal, round, and reactive to light.  ?Cardiovascular:  ?   Rate and Rhythm: Normal rate and

## 2021-11-23 ENCOUNTER — Encounter (HOSPITAL_COMMUNITY): Payer: Self-pay | Admitting: Physician Assistant

## 2021-11-23 ENCOUNTER — Inpatient Hospital Stay (HOSPITAL_COMMUNITY): Payer: Medicare HMO | Attending: Hematology | Admitting: Physician Assistant

## 2021-11-23 ENCOUNTER — Encounter (HOSPITAL_COMMUNITY): Payer: Self-pay

## 2021-11-23 VITALS — BP 123/64 | HR 103 | Temp 98.2°F | Resp 18

## 2021-11-23 DIAGNOSIS — E119 Type 2 diabetes mellitus without complications: Secondary | ICD-10-CM | POA: Insufficient documentation

## 2021-11-23 DIAGNOSIS — Z90710 Acquired absence of both cervix and uterus: Secondary | ICD-10-CM | POA: Insufficient documentation

## 2021-11-23 DIAGNOSIS — D500 Iron deficiency anemia secondary to blood loss (chronic): Secondary | ICD-10-CM | POA: Diagnosis not present

## 2021-11-23 DIAGNOSIS — I10 Essential (primary) hypertension: Secondary | ICD-10-CM | POA: Diagnosis not present

## 2021-11-23 DIAGNOSIS — D509 Iron deficiency anemia, unspecified: Secondary | ICD-10-CM | POA: Diagnosis not present

## 2021-11-23 DIAGNOSIS — D75839 Thrombocytosis, unspecified: Secondary | ICD-10-CM | POA: Diagnosis not present

## 2021-11-23 DIAGNOSIS — R7989 Other specified abnormal findings of blood chemistry: Secondary | ICD-10-CM | POA: Insufficient documentation

## 2021-11-23 DIAGNOSIS — Z809 Family history of malignant neoplasm, unspecified: Secondary | ICD-10-CM | POA: Insufficient documentation

## 2021-11-23 DIAGNOSIS — Z87891 Personal history of nicotine dependence: Secondary | ICD-10-CM | POA: Insufficient documentation

## 2021-11-23 HISTORY — DX: Iron deficiency anemia secondary to blood loss (chronic): D50.0

## 2021-11-23 NOTE — Patient Instructions (Signed)
[place] at [facility] ?Discharge Instructions ? ?You were seen today by Tarri Abernethy PA-C for your follow-up visit. ? ?ELEVATED D-DIMER: This is a lab test in your blood that can be elevated if you have blood clots.  In your case, you do not have any signs of blood clots or clotting disorders in your body at this time.  There may be other causes of elevated D-dimer including infection, inflammation, vascular disease, and malignancy. ? ?IRON DEFICIENCY ANEMIA: Your low iron levels may be due to blood loss from your intestines.  In order to make sure you do not have any masses in your intestine that could be causing bleeding, we will check a CT of your abdomen and pelvis.  We will also schedule you for IV iron x3 doses. ? ?FOLLOW-UP APPOINTMENT: Office visit in about 4 weeks, after CT scan ? ? ?Thank you for choosing [place] at [facility] to provide your oncology and hematology care.  To afford each patient quality time with our provider, please arrive at least 15 minutes before your scheduled appointment time.  ? ?If you have a lab appointment with the [facility] please come in thru the Main Entrance and check in at the main information desk. ? ?You need to re-schedule your appointment should you arrive 10 or more minutes late.  We strive to give you quality time with our providers, and arriving late affects you and other patients whose appointments are after yours.  Also, if you no show three or more times for appointments you may be dismissed from the clinic at the providers discretion.     ?Again, thank you for choosing [facility].  Our hope is that these requests will decrease the amount of time that you wait before being seen by our physicians.       ?_____________________________________________________________ ? ?Should you have questions after your visit to [facility], please contact our office at [phone] and  follow the prompts.  Our office hours are 8:00 a.m. and 4:30 p.m. Monday - Friday.  Please note that voicemails left after 4:00 p.m. may not be returned until the following business day.  We are closed weekends and major holidays.  You do have access to a nurse 24-7, just call the main number to the clinic [phone] and do not press any options, hold on the line and a nurse will answer the phone.   ? ?For prescription refill requests, have your pharmacy contact our office and allow 72 hours.   ? ?Due to Covid, you will need to wear a mask upon entering the hospital. If you do not have a mask, a mask will be given to you at the Main Entrance upon arrival. For doctor visits, patients may have 1 support person age 49 or older with them. For treatment visits, patients can not have anyone with them due to social distancing guidelines and our immunocompromised population.  ? ? ? ?

## 2021-12-02 ENCOUNTER — Inpatient Hospital Stay (HOSPITAL_COMMUNITY): Payer: Medicare HMO

## 2021-12-02 VITALS — BP 125/54 | HR 87 | Temp 97.5°F | Resp 18

## 2021-12-02 DIAGNOSIS — R7989 Other specified abnormal findings of blood chemistry: Secondary | ICD-10-CM | POA: Diagnosis not present

## 2021-12-02 DIAGNOSIS — D500 Iron deficiency anemia secondary to blood loss (chronic): Secondary | ICD-10-CM

## 2021-12-02 MED ORDER — SODIUM CHLORIDE 0.9 % IV SOLN: Freq: Once | INTRAVENOUS | Status: AC

## 2021-12-02 MED ORDER — ACETAMINOPHEN 325 MG PO TABS
650.0000 mg | ORAL_TABLET | Freq: Once | ORAL | Status: AC
  Administered 2021-12-02: 650 mg via ORAL
  Filled 2021-12-02: qty 2

## 2021-12-02 MED ORDER — LORATADINE 10 MG PO TABS
10.0000 mg | ORAL_TABLET | Freq: Once | ORAL | Status: AC
  Administered 2021-12-02: 10 mg via ORAL
  Filled 2021-12-02: qty 1

## 2021-12-02 MED ORDER — SODIUM CHLORIDE 0.9 % IV SOLN
300.0000 mg | Freq: Once | INTRAVENOUS | Status: AC
  Administered 2021-12-02: 300 mg via INTRAVENOUS
  Filled 2021-12-02: qty 300

## 2021-12-02 NOTE — Patient Instructions (Signed)
[facility]  Discharge Instructions: Thank you for choosing [facility] to provide your oncology and hematology care.  If you have a lab appointment with the Cancer Center, please come in thru the Main Entrance and check in at the main information desk.  Wear comfortable clothing and clothing appropriate for easy access to any Portacath or PICC line.   We strive to give you quality time with your provider. You may need to reschedule your appointment if you arrive late (15 or more minutes).  Arriving late affects you and other patients whose appointments are after yours.  Also, if you miss three or more appointments without notifying the office, you may be dismissed from the clinic at the provider's discretion.      For prescription refill requests, have your pharmacy contact our office and allow 72 hours for refills to be completed.    Today you received Venofer IV iron infusion.     BELOW ARE SYMPTOMS THAT SHOULD BE REPORTED IMMEDIATELY: *FEVER GREATER THAN 100.4 F (38 C) OR HIGHER *CHILLS OR SWEATING *NAUSEA AND VOMITING THAT IS NOT CONTROLLED WITH YOUR NAUSEA MEDICATION *UNUSUAL SHORTNESS OF BREATH *UNUSUAL BRUISING OR BLEEDING *URINARY PROBLEMS (pain or burning when urinating, or frequent urination) *BOWEL PROBLEMS (unusual diarrhea, constipation, pain near the anus) TENDERNESS IN MOUTH AND THROAT WITH OR WITHOUT PRESENCE OF ULCERS (sore throat, sores in mouth, or a toothache) UNUSUAL RASH, SWELLING OR PAIN  UNUSUAL VAGINAL DISCHARGE OR ITCHING   Items with * indicate a potential emergency and should be followed up as soon as possible or go to the Emergency Department if any problems should occur.  Please show the CHEMOTHERAPY ALERT CARD or IMMUNOTHERAPY ALERT CARD at check-in to the Emergency Department and triage nurse.  Should you have questions after your visit or need to cancel or reschedule your appointment, please contact [facility]  [phone]  and follow the prompts.  Office hours are 8:00 a.m. to 4:30 p.m. Monday - Friday. Please note that voicemails left after 4:00 p.m. may not be returned until the following business day.  We are closed weekends and major holidays. You have access to a nurse at all times for urgent questions. Please call the main number to the clinic [phone] and follow the prompts.  For any non-urgent questions, you may also contact your provider using MyChart. We now offer e-Visits for anyone 18 and older to request care online for non-urgent symptoms. For details visit [email or web address].   Also download the MyChart app! Go to the app store, search "MyChart", open the app, select [facility], and log in with your MyChart username and password.  Due to Covid, a mask is required upon entering the hospital/clinic. If you do not have a mask, one will be given to you upon arrival. For doctor visits, patients may have 1 support person aged 18 or older with them. For treatment visits, patients cannot have anyone with them due to current Covid guidelines and our immunocompromised population.  

## 2021-12-02 NOTE — Progress Notes (Signed)
Pt presents today for Venofer IV iron per provider's order. Vital signs stable and pt voiced no new complaints at this. ? ?Peripheral IV started with good blood return pre and post infusion. ? ?Venofer 300 mg given today per MD orders. Tolerated infusion without adverse affects. Vital signs stable. No complaints at this time. Discharged from clinic ambulatory in stable condition. Alert and oriented x 3. F/U with [facility] as scheduled.   ?

## 2021-12-09 ENCOUNTER — Inpatient Hospital Stay (HOSPITAL_COMMUNITY): Payer: Medicare HMO

## 2021-12-09 VITALS — BP 133/82 | HR 70 | Temp 97.5°F | Resp 18

## 2021-12-09 DIAGNOSIS — R7989 Other specified abnormal findings of blood chemistry: Secondary | ICD-10-CM | POA: Diagnosis not present

## 2021-12-09 DIAGNOSIS — D500 Iron deficiency anemia secondary to blood loss (chronic): Secondary | ICD-10-CM

## 2021-12-09 MED ORDER — SODIUM CHLORIDE 0.9 % IV SOLN: Freq: Once | INTRAVENOUS | Status: AC

## 2021-12-09 MED ORDER — SODIUM CHLORIDE 0.9 % IV SOLN
300.0000 mg | Freq: Once | INTRAVENOUS | Status: AC
  Administered 2021-12-09: 300 mg via INTRAVENOUS
  Filled 2021-12-09: qty 300

## 2021-12-09 MED ORDER — LORATADINE 10 MG PO TABS
10.0000 mg | ORAL_TABLET | Freq: Once | ORAL | Status: AC
  Administered 2021-12-09: 10 mg via ORAL
  Filled 2021-12-09: qty 1

## 2021-12-09 MED ORDER — ACETAMINOPHEN 325 MG PO TABS
650.0000 mg | ORAL_TABLET | Freq: Once | ORAL | Status: AC
  Administered 2021-12-09: 650 mg via ORAL
  Filled 2021-12-09: qty 2

## 2021-12-09 NOTE — Patient Instructions (Signed)
[facility]  Discharge Instructions: Thank you for choosing [facility] to provide your oncology and hematology care.  If you have a lab appointment with the Cancer Center, please come in thru the Main Entrance and check in at the main information desk.  Wear comfortable clothing and clothing appropriate for easy access to any Portacath or PICC line.   We strive to give you quality time with your provider. You may need to reschedule your appointment if you arrive late (15 or more minutes).  Arriving late affects you and other patients whose appointments are after yours.  Also, if you miss three or more appointments without notifying the office, you may be dismissed from the clinic at the provider's discretion.      For prescription refill requests, have your pharmacy contact our office and allow 72 hours for refills to be completed.    Today you received the following chemotherapy and/or immunotherapy agents Venofer      To help prevent nausea and vomiting after your treatment, we encourage you to take your nausea medication as directed.  BELOW ARE SYMPTOMS THAT SHOULD BE REPORTED IMMEDIATELY: *FEVER GREATER THAN 100.4 F (38 C) OR HIGHER *CHILLS OR SWEATING *NAUSEA AND VOMITING THAT IS NOT CONTROLLED WITH YOUR NAUSEA MEDICATION *UNUSUAL SHORTNESS OF BREATH *UNUSUAL BRUISING OR BLEEDING *URINARY PROBLEMS (pain or burning when urinating, or frequent urination) *BOWEL PROBLEMS (unusual diarrhea, constipation, pain near the anus) TENDERNESS IN MOUTH AND THROAT WITH OR WITHOUT PRESENCE OF ULCERS (sore throat, sores in mouth, or a toothache) UNUSUAL RASH, SWELLING OR PAIN  UNUSUAL VAGINAL DISCHARGE OR ITCHING   Items with * indicate a potential emergency and should be followed up as soon as possible or go to the Emergency Department if any problems should occur.  Please show the CHEMOTHERAPY ALERT CARD or IMMUNOTHERAPY ALERT CARD at check-in to the Emergency  Department and triage nurse.  Should you have questions after your visit or need to cancel or reschedule your appointment, please contact [facility] [phone]  and follow the prompts.  Office hours are 8:00 a.m. to 4:30 p.m. Monday - Friday. Please note that voicemails left after 4:00 p.m. may not be returned until the following business day.  We are closed weekends and major holidays. You have access to a nurse at all times for urgent questions. Please call the main number to the clinic [phone] and follow the prompts.  For any non-urgent questions, you may also contact your provider using MyChart. We now offer e-Visits for anyone 18 and older to request care online for non-urgent symptoms. For details visit [email or web address].   Also download the MyChart app! Go to the app store, search "MyChart", open the app, select [place], and log in with your MyChart username and password.  Due to Covid, a mask is required upon entering the hospital/clinic. If you do not have a mask, one will be given to you upon arrival. For doctor visits, patients may have 1 support person aged 18 or older with them. For treatment visits, patients cannot have anyone with them due to current Covid guidelines and our immunocompromised population.  

## 2021-12-09 NOTE — Progress Notes (Signed)
Patient presents today for Venofer infusion per providers order.  Vital sign WNL  Patient has no new complaints at this time.  Peripheral IV started and blood return noted pre and post infusion.  Venofer infusion given today per MD orders.  Stable during infusion without adverse affects.  Vital signs stable.  No complaints at this time.  Discharge from clinic ambulatory in stable condition.  Alert and oriented X 3.  Follow up with [facility] as scheduled.

## 2021-12-13 ENCOUNTER — Encounter: Payer: Self-pay | Admitting: Adult Health

## 2021-12-13 ENCOUNTER — Non-Acute Institutional Stay (SKILLED_NURSING_FACILITY): Payer: Medicare HMO | Admitting: Adult Health

## 2021-12-13 DIAGNOSIS — I700 Atherosclerosis of aorta: Secondary | ICD-10-CM

## 2021-12-13 DIAGNOSIS — F01B0 Vascular dementia, moderate, without behavioral disturbance, psychotic disturbance, mood disturbance, and anxiety: Secondary | ICD-10-CM | POA: Diagnosis not present

## 2021-12-13 DIAGNOSIS — N3281 Overactive bladder: Secondary | ICD-10-CM | POA: Diagnosis not present

## 2021-12-13 NOTE — Progress Notes (Signed)
Location:  [name]:  [place], Anna Hanna, [name], NP    CODE STATUS: full  No Known Allergies  Chief Complaint  Patient presents with   Medical Management of Chronic Issues                                       Urinary urgency: Vascular dementia without behavioral disturbance: Aortic atherosclerosis:     HPI:  She is a 87 year old long term resident of this facility being seen for the management of her chronic illnesses: Urinary urgency: Vascular dementia without behavioral disturbance: Aortic atherosclerosis. There are no reports of cough; shortness of breath; or chest pain.   Past Medical History:  Diagnosis Date   Abnormal posture    Per [facility] Electronic Medical Records System    Anemia    Cognitive communication deficit    Per [facility] Records [facility]    Contracture of left knee    Per [facility] Electronic [facility] Records [facility]    Diabetes mellitus without complication ([place])    Difficulty walking    Per [facility] Records [facility]    Extended spectrum beta lactamase (ESBL) resistance    Per [facility] Electronic Medical Records [facility]    Gastroesophageal reflux disease    Per [facility] Records [facility]    Glaucoma    Gram negative sepsis ([place])    Per [facility] Electronic Medical Records [facility]    Hypercholesteremia    Hypertension    Hypokalemia    Infection, bacterial    Per [facility] Electronic Medical Records System    Iron deficiency anemia due to chronic blood loss 11/23/2021   Muscle weakness (generalized)    Per [facility] Medical Records [facility]    Need for assistance with personal care    Per [facility] Records [facility]    OAB  (overactive bladder)    Per [facility] Medical Records [facility]    Ocular hypertension, bilateral    Per [facility] Records [facility]    Osteoarthritis of right knee    Per [facility] Electronic Medical Records [facility]    Paraplegia [facility])    Pyelonephritis, acute    Per [facility] Electronic Medical Records [facility]    Status post total right knee replacement    Unspecified dementia without behavioral disturbance    Per [facility] Electronic Medical Records [facility]    Urgency of urination    Per [facility] Records [facility]    Wheelchair dependence    Per [facility], [facility] Electronic Medical Records System     Past Surgical History:  Procedure Laterality Date   ABDOMINAL HYSTERECTOMY     CATARACT EXTRACTION     COLONOSCOPY N/A 12/10/2013   Procedure: COLONOSCOPY;  Surgeon: Danie Binder, MD;  Location: [facility];  Service: Endoscopy;  Laterality: N/A;  8:30 AM   TOTAL KNEE ARTHROPLASTY Right 03/30/2016   Procedure: TOTAL KNEE ARTHROPLASTY;  Surgeon: Carole Civil, MD;  Location: [facility] OR[facility];  Service: Orthopedics;  Laterality: Right;    Social History   Socioeconomic History  Marital status: Divorced    Spouse name: Not on file   Number of children: Not on file   Years of education: Not on file   Highest education level: Not on file  Occupational History   Occupation: retired   Tobacco Use   Smoking status: Former    Packs/day: 0.25    Years: 3.00    Pack years: 0.75    Types: Cigarettes   Smokeless tobacco: Never  Vaping Use   Vaping Use: Never used  Substance and Sexual Activity   Alcohol use: No   Drug use: No   Sexual activity: Not Currently    Birth control/protection: Post-menopausal  Other Topics Concern   Not on file  Social History Narrative   Long term resident of [facility]    Social  Determinants of Health   Financial Resource Strain: Not on file  Food Insecurity: Not on file  Transportation Needs: Not on file  Physical Activity: Not on file  Stress: Not on file  Social Connections: Not on file  Intimate Partner Violence: Not on file   Family History  Problem Relation Age of Onset   Cancer Mother    Arthritis Mother    Hypotension Father    Cancer Sister    Hypotension Sister    Colon cancer Neg Hx       VITAL SIGNS BP 124/63   Pulse 95   Temp (!) 97 F (36.1 C)   Resp 18   Ht 5\' 5"  (1.651 m)   Wt 207 lb 3.2 oz (94 kg)   SpO2 90%   BMI 34.48 kg/m   Outpatient Encounter Medications as of 12/13/2021  Medication Sig   acetaminophen (TYLENOL) 325 MG tablet Take 650 mg by mouth 2 (two) times daily.   ascorbic acid (VITAMIN C) 500 MG tablet Take 500 mg by mouth daily.   calcium citrate (CALCITRATE - DOSED IN MG ELEMENTAL CALCIUM) 950 MG tablet Take 200 mg of elemental calcium by mouth daily.   diclofenac Sodium (VOLTAREN) 1 % GEL Apply 4 g topically 3 (three) times daily as needed. Special Instructions: apply to right knee   diclofenac Sodium (VOLTAREN) 1 % GEL Apply 2 g topically 2 (two) times daily as needed. Special Instructions: apply to left index finger for arthritis pain   iron polysaccharides (NIFEREX) 150 MG capsule Take 1 capsule by mouth daily.   latanoprost (XALATAN) 0.005 % ophthalmic solution Place 1 drop into both eyes at bedtime. wait 5 minutes between multiple eye drops   mirabegron ER (MYRBETRIQ) 25 MG TB24 tablet Take 25 mg by mouth daily.   NON FORMULARY Diet: NAS,   omeprazole (PRILOSEC) 20 MG capsule Take 20 mg by mouth 2 (two) times daily before a meal. Special Instructions: *TAKE ON AN EMPTY STOMACH* FOR GERD *DO NOT CRUSH* (FORMULARY SUB FOR PANTOPRAZOLE 40MG )   polyethylene glycol powder (GLYCOLAX/MIRALAX) 17 GM/SCOOP powder Take by mouth daily as needed.   [DISCONTINUED] apixaban (ELIQUIS) 5 MG TABS tablet Take 5 mg by mouth 2  (two) times daily.   No facility-administered encounter medications on file as of 12/13/2021.     SIGNIFICANT DIAGNOSTIC EXAMS  PREVIOUS  02-05-20: DEXA: t score: -2.279  01-10-21: kub: significant fecal material identified.   01-17-21: chest x-ray: no evidence of acute cardiopulmonary disease; communicable  disease or tuberculosis   02-18-21: ct angio of chest:  1. Negative for acute PE or thoracic aortic dissection. 2.  Aortic Atherosclerosis   02-24-21: bilateral lower extremity venous doppler:  No evidence of deep venous thrombosis in either lower extremity.   02-24-21: KUB Moderate colonic stool burden. No evidence of bowel obstruction.  05-05-21: ct angio of chest:  1. No CT evidence for acute pulmonary embolus. 2. Bilateral lower lobe bronchial wall thickening with scattered areas of peripheral airway impaction. Imaging features suggest chronic infectious/inflammatory etiology. 3. Aortic Atherosclerosis   05-19-21: right lower extremity venous doppler:  1. No evidence of deep vein thrombosis within the visualized veins of the right lower extremity. If there are persistent symptoms, repeat venous Doppler examination may be obtained in 3 to 5 days.  2. Right mid femoral and right distal femoral veins not well visualized due to body habitus and patient could not tolerate compression.  NO NEW EXAMS.    LABS REVIEWED PREVIOUS;     01-17-21: wbc 4.3; hgb 10.3; hct 33.6; mcv 81.4 plt 385; glucose 105; bun 11; creat 0.50; k+ 3.5; na++ 134; ca 7.9; GFR >60; d-dimer: 5.93 CRP 5.3 01-24-21: d-dimer: 6.73  01-31-21: d-dimer: 5.30 02-07-21: d-dimer: 6.25 02-14-21: d-dimer: 12.74 02-24-21: d-dimer: >20 03-01-21: d-dimer: 14.18 03-10-21: d-dimer: 11.10 03-17-21; d-dimer: 10.44 03-22-21: d-dimer: 10.32  03-29-21: d-dimer: 10.97 04-11-21: d-dimer 12.12 04-25-21: d-dimer: 7.42   05-09-21: d-dimer: 5.60  05-19-21: d-dimer: 5.63 05-24-21; wbc 6.6; hgb 8.9; hct 30.4; mcv 81.1 plt 453 ;glucose 92; bun  16; creat 0.46; k+ 3.8; na++ 137; ca 8.5; GFR >60; liver normal albumin 3.1  05-27-21: guaiac negative 06-03-21: d-dimer: 6.31  06-20-21: wbc 6.3; hgb 9,3; hct 30.8; mcv 79.0 plt 431; tsh 1.144 hgb a1c 6.0  d-dimer 7.14 12/2; 06/29/21: guaiac +  07-04-21 d-dimer: 4.35 08-26-21; wbc 5.8; hgb 9.9; hct 32.9; mcv 77.4 plt 410; d-dimer 13.43; factor 5: 114 (n); factor 8: 233 (n) Fibrinogen 477 (ele) INR 1.4; folate 16.5; vitamin B 12: 314; ferritin 10; iron 18; tibc 361;   TODAY  11-21-21: d-dimer: >20  Review of Systems  Constitutional:  Negative for malaise/fatigue.  Respiratory:  Negative for cough and shortness of breath.   Cardiovascular:  Negative for chest pain, palpitations and leg swelling.  Gastrointestinal:  Negative for abdominal pain, constipation and heartburn.  Musculoskeletal:  Negative for back pain, joint pain and myalgias.  Skin: Negative.   Neurological:  Negative for dizziness.  Psychiatric/Behavioral:  The patient is not nervous/anxious.     Physical Exam Constitutional:      General: She is not in acute distress.    Appearance: She is well-developed. She is obese. She is not diaphoretic.  Neck:     Thyroid: No thyromegaly.  Cardiovascular:     Rate and Rhythm: Normal rate and regular rhythm.     Pulses: Normal pulses.     Heart sounds: Normal heart sounds.  Pulmonary:     Effort: Pulmonary effort is normal. No respiratory distress.     Breath sounds: Normal breath sounds.  Abdominal:     General: Bowel sounds are normal. There is no distension.     Palpations: Abdomen is soft.     Tenderness: There is no abdominal tenderness.  Musculoskeletal:     Cervical back: Neck supple.     Right lower leg: No edema.     Left lower leg: No edema.     Comments:   Is able to move all extremities History of right knee replacement with contracture    Lymphadenopathy:     Cervical: No cervical adenopathy.  Skin:    General: Skin is warm and dry.  Neurological:  Mental Status: She is alert. Mental status is at baseline.  Psychiatric:        Mood and Affect: Mood normal.     ASSESSMENT/ PLAN:   TODAY;   Urinary urgency: will continue ditropan xl 10 mg daily   2. Vascular dementia without behavioral disturbance: weight is 207 pounds will monitor  3. Aortic atherosclerosis: (ct 09-03-19)   PREVIOUS   4. Hypokalemia: is 3.8 will monitor   5. Increased ocular pressure bilateral: is stable will continue xalatan to both eyes  6. Primary osteoarthritis of right knee; left index finger: is stable will continue tylenol 650 mg three times daily voltaren gel 1% 4 gm to right knee three times daily left index finger 2 gm twice daily   7. Essential hypertension: stable b/p122/74 will continue asa 81 mg daily  8. Chronic anemia hgb 9.9 will continue ferrix daily   9. Hypercoagulable state due to COVID 19:   has been seen by hematology  10. GERD without esophagitis: is stable will continue prilosec 20 mg daily   Ok Edwards NP [facility]   call [phone]

## 2021-12-16 ENCOUNTER — Inpatient Hospital Stay (HOSPITAL_COMMUNITY): Payer: Medicare HMO

## 2021-12-16 VITALS — BP 103/50 | HR 87 | Temp 96.4°F | Resp 18

## 2021-12-16 DIAGNOSIS — D500 Iron deficiency anemia secondary to blood loss (chronic): Secondary | ICD-10-CM

## 2021-12-16 DIAGNOSIS — R7989 Other specified abnormal findings of blood chemistry: Secondary | ICD-10-CM | POA: Diagnosis not present

## 2021-12-16 MED ORDER — SODIUM CHLORIDE 0.9 % IV SOLN: Freq: Once | INTRAVENOUS | Status: AC

## 2021-12-16 MED ORDER — ACETAMINOPHEN 325 MG PO TABS
650.0000 mg | ORAL_TABLET | Freq: Once | ORAL | Status: AC
  Administered 2021-12-16: 650 mg via ORAL
  Filled 2021-12-16: qty 2

## 2021-12-16 MED ORDER — LORATADINE 10 MG PO TABS
10.0000 mg | ORAL_TABLET | Freq: Once | ORAL | Status: AC
  Administered 2021-12-16: 10 mg via ORAL
  Filled 2021-12-16: qty 1

## 2021-12-16 MED ORDER — SODIUM CHLORIDE 0.9 % IV SOLN
300.0000 mg | Freq: Once | INTRAVENOUS | Status: AC
  Administered 2021-12-16: 300 mg via INTRAVENOUS
  Filled 2021-12-16: qty 300

## 2021-12-16 NOTE — Progress Notes (Signed)
Patient presents today for Venofer infusion per providers order.  Vital signs WNL.  Patient has no new complaints at this time.    Peripheral IV started and blood return noted pre and post infusion.  Venofer given today per MD orders.  Stable during infusion without adverse affects.  Vital signs stable.  No complaints at this time.  Discharge from clinic vital signs in stable condition.  Alert and oriented X 3.  Follow up with [facility] as scheduled.

## 2021-12-16 NOTE — Patient Instructions (Signed)
[facility]  Discharge Instructions: Thank you for choosing [facility] to provide your oncology and hematology care.  If you have a lab appointment with the Cancer Center, please come in thru the Main Entrance and check in at the main information desk.  Wear comfortable clothing and clothing appropriate for easy access to any Portacath or PICC line.   We strive to give you quality time with your provider. You may need to reschedule your appointment if you arrive late (15 or more minutes).  Arriving late affects you and other patients whose appointments are after yours.  Also, if you miss three or more appointments without notifying the office, you may be dismissed from the clinic at the provider's discretion.      For prescription refill requests, have your pharmacy contact our office and allow 72 hours for refills to be completed.    Today you received the following chemotherapy and/or immunotherapy agents Venofer      To help prevent nausea and vomiting after your treatment, we encourage you to take your nausea medication as directed.  BELOW ARE SYMPTOMS THAT SHOULD BE REPORTED IMMEDIATELY: *FEVER GREATER THAN 100.4 F (38 C) OR HIGHER *CHILLS OR SWEATING *NAUSEA AND VOMITING THAT IS NOT CONTROLLED WITH YOUR NAUSEA MEDICATION *UNUSUAL SHORTNESS OF BREATH *UNUSUAL BRUISING OR BLEEDING *URINARY PROBLEMS (pain or burning when urinating, or frequent urination) *BOWEL PROBLEMS (unusual diarrhea, constipation, pain near the anus) TENDERNESS IN MOUTH AND THROAT WITH OR WITHOUT PRESENCE OF ULCERS (sore throat, sores in mouth, or a toothache) UNUSUAL RASH, SWELLING OR PAIN  UNUSUAL VAGINAL DISCHARGE OR ITCHING   Items with * indicate a potential emergency and should be followed up as soon as possible or go to the Emergency Department if any problems should occur.  Please show the CHEMOTHERAPY ALERT CARD or IMMUNOTHERAPY ALERT CARD at check-in to the Emergency  Department and triage nurse.  Should you have questions after your visit or need to cancel or reschedule your appointment, please contact [facility] [phone]  and follow the prompts.  Office hours are 8:00 a.m. to 4:30 p.m. Monday - Friday. Please note that voicemails left after 4:00 p.m. may not be returned until the following business day.  We are closed weekends and major holidays. You have access to a nurse at all times for urgent questions. Please call the main number to the clinic [phone] and follow the prompts.  For any non-urgent questions, you may also contact your provider using MyChart. We now offer e-Visits for anyone 18 and older to request care online for non-urgent symptoms. For details visit [email or web address].   Also download the MyChart app! Go to the app store, search "MyChart", open the app, select [place], and log in with your MyChart username and password.  Due to Covid, a mask is required upon entering the hospital/clinic. If you do not have a mask, one will be given to you upon arrival. For doctor visits, patients may have 1 support person aged 18 or older with them. For treatment visits, patients cannot have anyone with them due to current Covid guidelines and our immunocompromised population.  

## 2022-01-02 ENCOUNTER — Encounter (HOSPITAL_COMMUNITY): Payer: Self-pay | Admitting: Hematology

## 2022-01-03 ENCOUNTER — Encounter (HOSPITAL_COMMUNITY): Payer: Self-pay | Admitting: Hematology

## 2022-01-03 ENCOUNTER — Other Ambulatory Visit (HOSPITAL_COMMUNITY): Payer: Self-pay | Admitting: *Deleted

## 2022-01-03 DIAGNOSIS — E876 Hypokalemia: Secondary | ICD-10-CM

## 2022-01-04 ENCOUNTER — Inpatient Hospital Stay (HOSPITAL_COMMUNITY): Payer: Medicare HMO | Attending: Hematology

## 2022-01-04 ENCOUNTER — Ambulatory Visit (HOSPITAL_COMMUNITY)
Admission: RE | Admit: 2022-01-04 | Discharge: 2022-01-04 | Disposition: A | Discharge status: Home / Self Care | Payer: Medicare HMO | Source: Ambulatory Visit | Attending: Physician Assistant | Admitting: Physician Assistant

## 2022-01-04 DIAGNOSIS — D500 Iron deficiency anemia secondary to blood loss (chronic): Secondary | ICD-10-CM | POA: Insufficient documentation

## 2022-01-04 DIAGNOSIS — D509 Iron deficiency anemia, unspecified: Secondary | ICD-10-CM | POA: Diagnosis present

## 2022-01-04 DIAGNOSIS — Z90710 Acquired absence of both cervix and uterus: Secondary | ICD-10-CM | POA: Insufficient documentation

## 2022-01-04 DIAGNOSIS — K921 Melena: Secondary | ICD-10-CM | POA: Insufficient documentation

## 2022-01-04 DIAGNOSIS — E119 Type 2 diabetes mellitus without complications: Secondary | ICD-10-CM | POA: Diagnosis not present

## 2022-01-04 DIAGNOSIS — R7989 Other specified abnormal findings of blood chemistry: Secondary | ICD-10-CM | POA: Insufficient documentation

## 2022-01-04 DIAGNOSIS — Z809 Family history of malignant neoplasm, unspecified: Secondary | ICD-10-CM | POA: Diagnosis not present

## 2022-01-04 DIAGNOSIS — N6320 Unspecified lump in the left breast, unspecified quadrant: Secondary | ICD-10-CM | POA: Diagnosis not present

## 2022-01-04 DIAGNOSIS — Z7901 Long term (current) use of anticoagulants: Secondary | ICD-10-CM | POA: Diagnosis not present

## 2022-01-04 DIAGNOSIS — I10 Essential (primary) hypertension: Secondary | ICD-10-CM | POA: Diagnosis not present

## 2022-01-04 DIAGNOSIS — Z87891 Personal history of nicotine dependence: Secondary | ICD-10-CM | POA: Insufficient documentation

## 2022-01-04 DIAGNOSIS — D75839 Thrombocytosis, unspecified: Secondary | ICD-10-CM | POA: Diagnosis not present

## 2022-01-04 DIAGNOSIS — E876 Hypokalemia: Secondary | ICD-10-CM

## 2022-01-04 LAB — CBC WITH DIFFERENTIAL/PLATELET
Abs Immature Granulocytes: 0.01 10*3/uL (ref 0.00–0.07)
Basophils Absolute: 0.1 10*3/uL (ref 0.0–0.1)
Basophils Relative: 1 %
Eosinophils Absolute: 0.2 10*3/uL (ref 0.0–0.5)
Eosinophils Relative: 4 %
HCT: 40.9 % (ref 36.0–46.0)
Hemoglobin: 11.7 g/dL — ABNORMAL LOW (ref 12.0–15.0)
Immature Granulocytes: 0 %
Lymphocytes Relative: 34 %
Lymphs Abs: 1.8 10*3/uL (ref 0.7–4.0)
MCH: 22.9 pg — ABNORMAL LOW (ref 26.0–34.0)
MCHC: 28.6 g/dL — ABNORMAL LOW (ref 30.0–36.0)
MCV: 80.2 fL (ref 80.0–100.0)
Monocytes Absolute: 0.4 10*3/uL (ref 0.1–1.0)
Monocytes Relative: 8 %
Neutro Abs: 2.9 10*3/uL (ref 1.7–7.7)
Neutrophils Relative %: 53 %
Platelets: 280 10*3/uL (ref 150–400)
RBC: 5.1 MIL/uL (ref 3.87–5.11)
RDW: 22.4 % — ABNORMAL HIGH (ref 11.5–15.5)
WBC: 5.4 10*3/uL (ref 4.0–10.5)
nRBC: 0 % (ref 0.0–0.2)

## 2022-01-04 LAB — COMPREHENSIVE METABOLIC PANEL
ALT: 11 U/L (ref 0–44)
AST: 15 U/L (ref 15–41)
Albumin: 3.6 g/dL (ref 3.5–5.0)
Alkaline Phosphatase: 80 U/L (ref 38–126)
Anion gap: 8 (ref 5–15)
BUN: 14 mg/dL (ref 8–23)
CO2: 25 mmol/L (ref 22–32)
Calcium: 8.8 mg/dL — ABNORMAL LOW (ref 8.9–10.3)
Chloride: 101 mmol/L (ref 98–111)
Creatinine, Ser: 0.63 mg/dL (ref 0.44–1.00)
GFR, Estimated: >60 mL/min (ref >60)
Glucose, Bld: 88 mg/dL (ref 70–99)
Potassium: 4.8 mmol/L (ref 3.5–5.1)
Sodium: 134 mmol/L — ABNORMAL LOW (ref 135–145)
Total Bilirubin: 0.4 mg/dL (ref 0.3–1.2)
Total Protein: 7.7 g/dL (ref 6.5–8.1)

## 2022-01-04 LAB — IRON AND TIBC
Iron: 44 ug/dL (ref 28–170)
Saturation Ratios: 14 % (ref 10.4–31.8)
TIBC: 308 ug/dL (ref 250–450)
UIBC: 264 ug/dL

## 2022-01-04 LAB — FERRITIN: Ferritin: 90 ng/mL (ref 11–307)

## 2022-01-04 MED ORDER — IOHEXOL 300 MG/ML  SOLN
100.0000 mL | Freq: Once | INTRAMUSCULAR | Status: AC | PRN
  Administered 2022-01-04: 100 mL via INTRAVENOUS

## 2022-01-11 ENCOUNTER — Non-Acute Institutional Stay (SKILLED_NURSING_FACILITY): Payer: Medicare HMO | Admitting: Adult Health

## 2022-01-11 ENCOUNTER — Encounter: Payer: Self-pay | Admitting: Adult Health

## 2022-01-11 DIAGNOSIS — M1711 Unilateral primary osteoarthritis, right knee: Secondary | ICD-10-CM

## 2022-01-11 DIAGNOSIS — H40053 Ocular hypertension, bilateral: Secondary | ICD-10-CM | POA: Diagnosis not present

## 2022-01-11 DIAGNOSIS — M19042 Primary osteoarthritis, left hand: Secondary | ICD-10-CM

## 2022-01-11 DIAGNOSIS — E876 Hypokalemia: Secondary | ICD-10-CM

## 2022-01-11 NOTE — Progress Notes (Unsigned)
[facility] [address], [place] [address]   CLINIC:  Medical Oncology/Hematology  PCP:  Sharee Holster, NP [address] [place] [address] [phone]   REASON FOR VISIT:  Follow-up for elevated D-dimer  CURRENT THERAPY: Eliquis 5 mg twice daily  INTERVAL HISTORY:  Anna Hanna 87 y.o. female returns for routine follow-up of her elevated D-dimer.  She was seen for initial consultation by Dr. Ellin Saba on 08/26/2021.  She was last seen by Rojelio Brenner PA-C on 11/23/2021.  At today's visit, she reports feeling fairly well.  ***  No recent hospitalizations, surgeries, or changes in baseline health status. *** She does not have any current signs of DVT or PE.  She denies any unilateral leg swelling, pain, and erythema.  She has not noticed any new shortness of breath, dyspnea on exertion, chest pain, cough, hemoptysis, and palpitations.  She has not had any recent infections, surgeries, hospitalizations   *** She remains on Eliquis at this time.  She reports that she has soft, black-colored bowel movements, which she thinks may be from taking her iron pill.  She has mild fatigue.  She denies any pica, restless legs, headaches, chest pain, dyspnea on exertion, lightheadedness, or syncope.  She has  *** % energy and  *** % appetite. She endorses that she is maintaining a stable weight.   REVIEW OF SYSTEMS:  ***  Review of Systems  Constitutional:  Positive for fatigue. Negative for appetite change, chills, diaphoresis, fever and unexpected weight change.  HENT:   Negative for lump/mass and nosebleeds.   Eyes:  Negative for eye problems.  Respiratory:  Negative for cough, hemoptysis and shortness of breath.   Cardiovascular:  Negative for chest pain, leg swelling and palpitations.  Gastrointestinal:  Negative for abdominal pain, blood in stool, constipation, diarrhea, nausea and vomiting.  Genitourinary:  Positive for frequency. Negative for  hematuria.   Skin: Negative.   Neurological:  Negative for dizziness, headaches and light-headedness.  Hematological:  Does not bruise/bleed easily.  Psychiatric/Behavioral:  Positive for depression. The patient is nervous/anxious.       PAST MEDICAL/SURGICAL HISTORY:  Past Medical History:  Diagnosis Date   Abnormal posture    Per [facility] Electronic Medical Records System    Anemia    Cognitive communication deficit    Per [facility] Records [facility]    Contracture of left knee    Per [facility] Electronic [facility] Records [facility]    Diabetes mellitus without complication (HCC)    Difficulty walking    Per [facility] Records [facility]    Extended spectrum beta lactamase (ESBL) resistance    Per [facility] Records [facility]    Gastroesophageal reflux disease    Per [facility] Records [facility]    Glaucoma    Gram negative sepsis (HCC)    Per [facility] Electronic Medical Records [facility]    Hypercholesteremia    Hypertension    Hypokalemia    Infection, bacterial    Per [facility] Electronic [facility] Records [facility]    Iron deficiency anemia due to chronic blood loss 11/23/2021   Muscle weakness (generalized)    Per [facility] Records [facility]    Need for assistance with personal care    Per [facility] Records [facility]  OAB (overactive bladder)    Per [facility] Records [facility]    Ocular hypertension, bilateral    Per [facility] Records [facility]    Osteoarthritis of right knee    Per [facility] Records [facility]    Paraplegia [facility])    Pyelonephritis, acute    Per [facility] Records [facility]    Status post total right knee replacement    Unspecified dementia without behavioral disturbance    Per [facility] Electronic Medical Records [facility]    Urgency of urination    Per [facility] Records [facility]    Wheelchair dependence    Per [facility] Electronic [facility] Records [facility]    Past Surgical History:  Procedure Laterality Date   ABDOMINAL HYSTERECTOMY     CATARACT EXTRACTION     COLONOSCOPY N/A 12/10/2013   Procedure: COLONOSCOPY;  Surgeon: Danie Binder, MD;  Location: [facility];  Service: Endoscopy;  Laterality: N/A;  8:30 AM   TOTAL KNEE ARTHROPLASTY Right 03/30/2016   Procedure: TOTAL KNEE ARTHROPLASTY;  Surgeon: Carole Civil, MD;  Location: [facility];  Service: Orthopedics;  Laterality: Right;     SOCIAL HISTORY:  Social History   Socioeconomic History   Marital status: Divorced    Spouse name: Not on file   Number of children: Not on file   Years of education: Not on file   Highest education level: Not on file  Occupational History   Occupation: retired   Tobacco Use   Smoking status: Former    Packs/day: 0.25    Years: 3.00    Total pack years: 0.75    Types: Cigarettes   Smokeless tobacco: Never  Vaping Use   Vaping Use: Never used  Substance and Sexual Activity   Alcohol use: No   Drug use: No   Sexual activity: Not Currently    Birth control/protection: Post-menopausal  Other Topics Concern   Not on file  Social History Narrative   Long term resident of [facility]    Social Determinants of Health   Financial Resource Strain: Not on file  Food Insecurity: Not on file  Transportation Needs: Not on file  Physical Activity: Inactive (02/17/2020)   Exercise Vital Sign    Days of Exercise per Week: 0 days    Minutes of Exercise per Session: 0 min  Stress: Not on file  Social Connections: Not on file  Intimate Partner Violence:  Not At Risk (02/17/2020)   Humiliation, Afraid, Rape, and Kick questionnaire    Fear of Current or Ex-Partner: No    Emotionally Abused: No    Physically Abused: No    Sexually Abused: No    FAMILY HISTORY:  Family History  Problem Relation Age of Onset   Cancer Mother    Arthritis Mother    Hypotension Father    Cancer Sister    Hypotension Sister    Colon cancer Neg Hx     CURRENT MEDICATIONS:  Outpatient Encounter Medications as of 01/12/2022  Medication Sig   acetaminophen (TYLENOL) 325 MG tablet Take 650 mg by mouth 2 (two) times daily.   ascorbic acid (VITAMIN C) 500 MG tablet Take 500 mg by mouth daily.   calcium citrate (CALCITRATE - DOSED IN MG ELEMENTAL CALCIUM) 950 MG tablet Take 200 mg of elemental calcium by mouth daily.   diclofenac Sodium (VOLTAREN)  1 % GEL Apply 4 g topically 3 (three) times daily as needed. Special Instructions: apply to right knee   diclofenac Sodium (VOLTAREN) 1 % GEL Apply 2 g topically 2 (two) times daily as needed. Special Instructions: apply to left index finger for arthritis pain   iron polysaccharides (NIFEREX) 150 MG capsule Take 1 capsule by mouth daily.   latanoprost (XALATAN) 0.005 % ophthalmic solution Place 1 drop into both eyes at bedtime. wait 5 minutes between multiple eye drops   mirabegron ER (MYRBETRIQ) 25 MG TB24 tablet Take 25 mg by mouth daily.   NON FORMULARY Diet: NAS,   omeprazole (PRILOSEC) 20 MG capsule Take 20 mg by mouth 2 (two) times daily before a meal. Special Instructions: *TAKE ON AN EMPTY STOMACH* FOR GERD *DO NOT CRUSH* (FORMULARY SUB FOR PANTOPRAZOLE 40MG )   polyethylene glycol powder (GLYCOLAX/MIRALAX) 17 GM/SCOOP powder Take by mouth daily as needed.   No facility-administered encounter medications on file as of 01/12/2022.    ALLERGIES:  No Known Allergies   PHYSICAL EXAM:  ECOG PERFORMANCE STATUS: 3 - Symptomatic, >50% confined to bed ***   There were no vitals filed for this visit. There were no  vitals filed for this visit. Physical Exam Constitutional:      Appearance: Normal appearance. She is obese.     Comments: Presents in wheelchair  HENT:     Head: Normocephalic and atraumatic.     Mouth/Throat:     Mouth: Mucous membranes are moist.  Eyes:     Extraocular Movements: Extraocular movements intact.     Pupils: Pupils are equal, round, and reactive to light.  Cardiovascular:     Rate and Rhythm: Normal rate and regular rhythm.     Pulses: Normal pulses.     Heart sounds: Normal heart sounds.  Pulmonary:     Effort: Pulmonary effort is normal.     Breath sounds: Normal breath sounds.  Abdominal:     General: Bowel sounds are normal.     Palpations: Abdomen is soft.     Tenderness: There is no abdominal tenderness.  Musculoskeletal:        General: No swelling.     Right lower leg: No edema.     Left lower leg: No edema.  Lymphadenopathy:     Cervical: No cervical adenopathy.  Skin:    General: Skin is warm and dry.  Neurological:     General: No focal deficit present.     Mental Status: She is alert and oriented to person, place, and time.  Psychiatric:        Mood and Affect: Mood normal.        Behavior: Behavior normal.      LABORATORY DATA:  I have reviewed the labs as listed.  CBC    Component Value Date/Time   WBC 5.4 01/04/2022 1302   RBC 5.10 01/04/2022 1302   HGB 11.7 (L) 01/04/2022 1302   HCT 40.9 01/04/2022 1302   HCT 26.4 (L) 04/03/2016 0550   PLT 280 01/04/2022 1302   MCV 80.2 01/04/2022 1302   MCH 22.9 (L) 01/04/2022 1302   MCHC 28.6 (L) 01/04/2022 1302   RDW 22.4 (H) 01/04/2022 1302   LYMPHSABS 1.8 01/04/2022 1302   MONOABS 0.4 01/04/2022 1302   EOSABS 0.2 01/04/2022 1302   BASOSABS 0.1 01/04/2022 1302      Latest Ref Rng & Units 01/04/2022    1:02 PM 05/24/2021    8:32 AM 05/05/2021    3:59  PM  CMP  Glucose 70 - 99 mg/dL 88  92    BUN 8 - 23 mg/dL 14  16    Creatinine 5.17 - 1.00 mg/dL 6.16  0.73  7.10   Sodium 135 - 145  mmol/L 134  137    Potassium 3.5 - 5.1 mmol/L 4.8  3.8    Chloride 98 - 111 mmol/L 101  104    CO2 22 - 32 mmol/L 25  26    Calcium 8.9 - 10.3 mg/dL 8.8  8.5    Total Protein 6.5 - 8.1 g/dL 7.7  6.8    Total Bilirubin 0.3 - 1.2 mg/dL 0.4  0.2    Alkaline Phos 38 - 126 U/L 80  59    AST 15 - 41 U/L 15  11    ALT 0 - 44 U/L 11  10      DIAGNOSTIC IMAGING:  I have independently reviewed the relevant imaging and discussed with the patient.  ASSESSMENT & PLAN: 1.  Prolonged elevated d-dimer on anticoagulation therapy: - From extensive review of the chart, it appears that she had COVID infection with elevated D-dimer in June 2022. - Eliquis was started at low-dose of 2.5 mg twice daily around 01/19/2021.  At some point she was switched to Lovenox and then switched back to Eliquis at 5 mg twice daily which she remains on at this time. - She had a CT chest angiogram on 02/18/2021 on 05/05/2021 both of which were negative for pulmonary embolism.  Lower extremity Doppler on 02/24/2021 was negative for DVT. - Denies any recent infections or hospitalizations.  Denies any recent surgeries. - Denies any B symptoms.   - Denies bleeding per rectum or melena.  Last colonoscopy was in 2015 which was normal, however was found to be Hemoccult positive x2 in December 2022.  Patient saw GI (Dr. Marletta Lor) on 08/11/2021, but declined EGD/colonoscopy. - CTAP on 09/03/2019 showed aortobiiliac atherosclerosis with no aneurysm.  No enlarged abdominal pelvic lymph nodes.  Spleen was normal in size with no abnormality. - 2D echocardiogram (09/20/2021): LVEF 55 to 60%, grade 1 diastolic dysfunction.  No major valvulopathy is noted. -Hematology work-up (08/26/2021) ruled out chronic DIC Fibrinogen minimally elevated at 477  Elevated factor VIII at 233% Normal factor V. Elevated INR 1.4/PT 17.1, APTT normal at 32. - Most recent D-dimer (11/22/2021): Markedly elevated at > 20 - CT abdomen pelvis was obtained on 01/04/2022 to rule  out occult malignancy in the setting of elevated D-dimer, Hemoccult positive stools, and iron deficiency anemia - results revealed left breast calcification suspected to be benign (stable compared to CT abdomen/pelvis from February 2021), no findings suspicious for malignancy. - Discussed with Dr. Ellin Saba, who recommends against further D-dimer testing at this time, unless patient is exhibiting symptoms concerning for possible blood clots. ***  - Discussed with patient that there is wide differential for elevated D-dimer, including clotting, inflammation, infection, and malignancy. - PLAN: Discontinue anticoagulation???  ***  2.  Left breast lesion - Patient has history of oil cyst in the left breast, excisional biopsy performed on 09/17/2002, but I am unable to review pathology reports.  Presumably benign, since patient denies any history of breast cancer.  - Last mammogram on record was 07/01/2015, which is BI-RADS Category 1 negative, no mammographic evidence of malignancy.  No mention of left breast calcification made and report, but it is clearly visible on my personal review of images, nonconcerning in appearance (round, uniform macrocalcification).  *** -  Lesion noted on CT abdomen/pelvis with contrast on 09/03/2019 as a "peripherally calcified 2.2 cm lesion in the left breast/low left axilla partially included" - CT abdomen/pelvis (01/04/2022): "Peripherally calcified nodular density in left breast is rounded measuring 18 mm and appears unchanged" - Suspected to be benign calcification of left breast, given appearance and stability dating back to mammogram in 2016 and prior.   - PLAN: No further work-up or follow-up needed??  ***  2.  Iron deficiency anemia - Hemoccult positive x2 in December 2022 - Patient saw GI (Dr. Abbey Chatters) on 08/11/2021, but declined further work-up with EGD/colonoscopy. - Labs from 08/26/2021 show Hgb 9.9/MCV 77.4.  Ferritin 10 with iron saturation 5%.  Mild thrombocytosis  with platelets 410. - Additional labs (08/26/2021): Normal SPEP.  Normal B12, methylmalonic acid, folate, copper. - She reports black stool, but thinks that this may be from her iron tablet. - She is taking daily iron polysaccharide tablet. - She received IV iron with Venofer 300 mg x 3 from 12/02/2021 through 12/16/2021 - Most recent labs (01/04/2022): Hgb improved at 11.7 with resolution of thrombocytosis.  Ferritin 90, iron saturation 14%. - CT abdomen/pelvis (01/04/2022): No evidence of GI tract malignancy - Discussed with patient that she likely has chronic GI blood loss from unknown source and that while her CT scan did not show any evidence of malignancy, this does not mean that it is completely excluded.  She declines further work-up at this time.  She would like to treat supportively with iron repletion as needed. ***  - PLAN: Recommend additional IV iron with Venofer 300 mg x 3 (goal ferritin >100 and iron saturation >20%) ***  - Repeat labs and RTC in 3 months  3.  Thrombocytosis - Intermittent mild thrombocytosis noted since at least 2018 - Resolved after iron supplementation  4.  Social/family history: - She reports that she has been at [facility] for the last couple of years after she broke her right leg and cannot walk.  She is wheelchair dependent.  She worked in a [employer] prior to retirement.  Quit smoking 2 years ago.  Smoked half pack per day for 15 years. - No family history of malignancies.   PLAN SUMMARY & DISPOSITION: IV Venofer 300 mg x 3 *** Repeat labs and RTC in 3 months  All questions were answered. The patient knows to call the clinic with any problems, questions or concerns.  Medical decision making: Moderate ***   Time spent on visit: I spent 20 minutes counseling the patient face to face. The total time spent in the appointment was 30 minutes and more than 50% was on counseling.   Harriett Rush, PA-C   ***

## 2022-01-11 NOTE — Progress Notes (Signed)
Location:  [facility] Room Number: 105 Place of Service:  SNF (31)   CODE STATUS: full code   No Known Allergies  Chief Complaint  Patient presents with   Medical Management of Chronic Issues                        Hypokalemia:   Increased ocular pressure bilateral: Primary osteoarthritis of right knee; left index finger    HPI:  She is a 87 year old long term resident of this facility being seen for the management of her chronic illnesses:   Hypokalemia:   Increased ocular pressure bilateral: Primary osteoarthritis of right knee; left index finger. There are no reports of uncontrolled pain. She continues to get out of bed daily. She does socialize with others.   Past Medical History:  Diagnosis Date   Abnormal posture    Per [facility], [facility] Electronic Medical Records System    Anemia    Cognitive communication deficit    Per [facility], [facility] Electronic [facility] Records [facility]    Contracture of left knee    Per [facility], [facility] Electronic [facility] Records [facility]    Diabetes mellitus without complication (HCC)    Difficulty walking    Per [facility], [facility] Electronic [facility] Records [facility]    Extended spectrum beta lactamase (ESBL) resistance    Per [facility], [facility] Electronic Medical Records [facility]    Gastroesophageal reflux disease    Per [facility], [facility] Records [facility]    Glaucoma    Gram negative sepsis (HCC)    Per [facility], [facility] Electronic Medical Records [facility]    Hypercholesteremia    Hypertension    Hypokalemia    Infection, bacterial    Per [facility], [facility] Electronic Medical Records [facility]    Iron deficiency anemia due to chronic blood loss 11/23/2021   Muscle weakness (generalized)    Per [facility], [facility] Medical Records [facility]    Need for assistance with personal care    Per [facility], [facility]    OAB (overactive bladder)    Per [facility], [facility] Records [facility]    Ocular hypertension, bilateral    Per [facility], [facility] Records [facility]    Osteoarthritis of right knee    Per [facility], [facility] Records [facility]    Paraplegia [facility])    Pyelonephritis, acute    Per [facility], [facility] Electronic Medical Records [facility]    Status post total right knee replacement    Unspecified dementia without behavioral disturbance    Per [facility], [facility] Electronic [facility] Records [facility]    Urgency of urination    Per [facility], [facility] Records [facility]    Wheelchair dependence    Per [facility], [facility] Electronic Medical Records System     Past Surgical History:  Procedure Laterality Date   ABDOMINAL HYSTERECTOMY     CATARACT EXTRACTION     COLONOSCOPY N/A 12/10/2013   Procedure: COLONOSCOPY;  Surgeon: West Bali, MD;  Location: [facility];  Service: Endoscopy;  Laterality: N/A;  8:30 AM   TOTAL KNEE ARTHROPLASTY Right 03/30/2016   Procedure: TOTAL KNEE ARTHROPLASTY;  Surgeon: Vickki Hearing, MD;  Location: [facility] ORS;  Service: Orthopedics;  Laterality: Right;  Social History   Socioeconomic History   Marital status: Divorced    Spouse name: Not on file   Number of children: Not on file   Years of education: Not on file   Highest education level: Not on file  Occupational History   Occupation: retired   Tobacco Use   Smoking status: Former    Packs/day: 0.25    Years: 3.00    Total pack years: 0.75    Types: Cigarettes   Smokeless tobacco: Never  Vaping Use   Vaping Use: Never used  Substance and Sexual Activity   Alcohol use: No   Drug use: No   Sexual activity: Not Currently    Birth control/protection: Post-menopausal  Other Topics Concern   Not on file  Social  History Narrative   Long term resident of [facility]    Social Determinants of Health   Financial Resource Strain: Not on file  Food Insecurity: Not on file  Transportation Needs: Not on file  Physical Activity: Inactive (02/17/2020)   Exercise Vital Sign    Days of Exercise per Week: 0 days    Minutes of Exercise per Session: 0 min  Stress: Not on file  Social Connections: Not on file  Intimate Partner Violence: Not At Risk (02/17/2020)   Humiliation, Afraid, Rape, and Kick questionnaire    Fear of Current or Ex-Partner: No    Emotionally Abused: No    Physically Abused: No    Sexually Abused: No   Family History  Problem Relation Age of Onset   Cancer Mother    Arthritis Mother    Hypotension Father    Cancer Sister    Hypotension Sister    Colon cancer Neg Hx       VITAL SIGNS BP 138/78   Pulse 80   Temp 98.1 F (36.7 C)   Resp 18   Ht 5\' 5"  (1.651 m)   Wt 204 lb 6.4 oz (92.7 kg)   BMI 34.01 kg/m   Outpatient Encounter Medications as of 01/11/2022  Medication Sig   acetaminophen (TYLENOL) 325 MG tablet Take 650 mg by mouth 2 (two) times daily.   ascorbic acid (VITAMIN C) 500 MG tablet Take 500 mg by mouth daily.   calcium citrate (CALCITRATE - DOSED IN MG ELEMENTAL CALCIUM) 950 MG tablet Take 200 mg of elemental calcium by mouth daily.   diclofenac Sodium (VOLTAREN) 1 % GEL Apply 4 g topically 3 (three) times daily as needed. Special Instructions: apply to right knee   diclofenac Sodium (VOLTAREN) 1 % GEL Apply 2 g topically 2 (two) times daily as needed. Special Instructions: apply to left index finger for arthritis pain   iron polysaccharides (NIFEREX) 150 MG capsule Take 1 capsule by mouth daily.   latanoprost (XALATAN) 0.005 % ophthalmic solution Place 1 drop into both eyes at bedtime. wait 5 minutes between multiple eye drops   mirabegron ER (MYRBETRIQ) 25 MG TB24 tablet Take 25 mg by mouth daily.   NON FORMULARY Diet: NAS,   omeprazole (PRILOSEC) 20 MG capsule  Take 20 mg by mouth 2 (two) times daily before a meal. Special Instructions: *TAKE ON AN EMPTY STOMACH* FOR GERD *DO NOT CRUSH* (FORMULARY SUB FOR PANTOPRAZOLE 40MG )   polyethylene glycol powder (GLYCOLAX/MIRALAX) 17 GM/SCOOP powder Take by mouth daily as needed.   No facility-administered encounter medications on file as of 01/11/2022.     SIGNIFICANT DIAGNOSTIC EXAMS   PREVIOUS  02-05-20: DEXA: t score: -2.279  01-10-21: kub: significant fecal  material identified.   01-17-21: chest x-ray: no evidence of acute cardiopulmonary disease; communicable  disease or tuberculosis   02-18-21: ct angio of chest:  1. Negative for acute PE or thoracic aortic dissection. 2.  Aortic Atherosclerosis   02-24-21: bilateral lower extremity venous doppler:  No evidence of deep venous thrombosis in either lower extremity.   02-24-21: KUB Moderate colonic stool burden. No evidence of bowel obstruction.  05-05-21: ct angio of chest:  1. No CT evidence for acute pulmonary embolus. 2. Bilateral lower lobe bronchial wall thickening with scattered areas of peripheral airway impaction. Imaging features suggest chronic infectious/inflammatory etiology. 3. Aortic Atherosclerosis   05-19-21: right lower extremity venous doppler:  1. No evidence of deep vein thrombosis within the visualized veins of the right lower extremity. If there are persistent symptoms, repeat venous Doppler examination may be obtained in 3 to 5 days.  2. Right mid femoral and right distal femoral veins not well visualized due to body habitus and patient could not tolerate compression.  NO NEW EXAMS.    LABS REVIEWED PREVIOUS;     01-17-21: wbc 4.3; hgb 10.3; hct 33.6; mcv 81.4 plt 385; glucose 105; bun 11; creat 0.50; k+ 3.5; na++ 134; ca 7.9; GFR >60; d-dimer: 5.93 CRP 5.3 01-24-21: d-dimer: 6.73  01-31-21: d-dimer: 5.30 02-07-21: d-dimer: 6.25 02-14-21: d-dimer: 12.74 02-24-21: d-dimer: >20 03-01-21: d-dimer: 14.18 03-10-21: d-dimer:  11.10 03-17-21; d-dimer: 10.44 03-22-21: d-dimer: 10.32  03-29-21: d-dimer: 10.97 04-11-21: d-dimer 12.12 04-25-21: d-dimer: 7.42   05-09-21: d-dimer: 5.60  05-19-21: d-dimer: 5.63 05-24-21; wbc 6.6; hgb 8.9; hct 30.4; mcv 81.1 plt 453 ;glucose 92; bun 16; creat 0.46; k+ 3.8; na++ 137; ca 8.5; GFR >60; liver normal albumin 3.1  05-27-21: guaiac negative 06-03-21: d-dimer: 6.31  06-20-21: wbc 6.3; hgb 9,3; hct 30.8; mcv 79.0 plt 431; tsh 1.144 hgb a1c 6.0  d-dimer 7.14 12/2; 06/29/21: guaiac +  07-04-21 d-dimer: 4.35 08-26-21; wbc 5.8; hgb 9.9; hct 32.9; mcv 77.4 plt 410; d-dimer 13.43; factor 5: 114 (n); factor 8: 233 (n) Fibrinogen 477 (ele) INR 1.4; folate 16.5; vitamin B 12: 314; ferritin 10; iron 18; tibc 361;  11-21-21: d-dimer: >20  TODAY  01-04-22: wbc 5.4; hgb 11.7; hct 40.9; mcv 80.2 plt 280; glucose 88 bun 14; creat 0.63; k+ 4.8; na++ 134; ca 8.8; gfr>60; protein 7.9; albumin 3.6; iron 44; tibc 308; ferritin 90    Review of Systems  Constitutional:  Negative for malaise/fatigue.  Respiratory:  Negative for cough and shortness of breath.   Cardiovascular:  Negative for chest pain, palpitations and leg swelling.  Gastrointestinal:  Negative for abdominal pain, constipation and heartburn.  Musculoskeletal:  Negative for back pain, joint pain and myalgias.  Skin: Negative.   Neurological:  Negative for dizziness.  Psychiatric/Behavioral:  The patient is not nervous/anxious.     Physical Exam Constitutional:      General: She is not in acute distress.    Appearance: She is well-developed. She is obese. She is not diaphoretic.  Neck:     Thyroid: No thyromegaly.  Cardiovascular:     Rate and Rhythm: Normal rate and regular rhythm.     Pulses: Normal pulses.     Heart sounds: Normal heart sounds.  Pulmonary:     Effort: Pulmonary effort is normal. No respiratory distress.     Breath sounds: Normal breath sounds.  Abdominal:     General: Bowel sounds are normal. There is no  distension.     Palpations: Abdomen is soft.  Tenderness: There is no abdominal tenderness.  Musculoskeletal:     Cervical back: Neck supple.     Right lower leg: No edema.     Left lower leg: No edema.     Comments:  Is able to move all extremities History of right knee replacement with contracture     Lymphadenopathy:     Cervical: No cervical adenopathy.  Skin:    General: Skin is warm and dry.  Neurological:     Mental Status: She is alert. Mental status is at baseline.  Psychiatric:        Mood and Affect: Mood normal.      ASSESSMENT/ PLAN:  TODAY;   Hypokalemia: k+ 4.8; will monitor  2. Increased ocular pressure bilateral: will continue xalatan to both eyes  3. Primary osteoarthritis of right knee; left index finger: will continue tylenol 650 mg three times daily; voltaren gel 1% 4 gm to right knee three times daily and left index finger 2 gm twice daily    PREVIOUS   4. Essential hypertension: stable b/p122/74 will continue asa 81 mg daily  5. Chronic anemia hgb 9.9 will continue ferrix twice weekly   6. Hypercoagulable state due to COVID 19:   has been seen by hematology  7. GERD without esophagitis: is stable will continue prilosec 20 mg daily   8. Urinary urgency: will continue ditropan xl 10 mg daily   9. Vascular dementia without behavioral disturbance: weight is 204 pounds will monitor  10. Aortic atherosclerosis: (ct 09-03-19)   Will check hgb a1c; urine for micro-albumin   Synthia Innocent NP [facility]  call [phone]

## 2022-01-12 ENCOUNTER — Other Ambulatory Visit (HOSPITAL_COMMUNITY): Payer: Self-pay | Admitting: Physician Assistant

## 2022-01-12 ENCOUNTER — Other Ambulatory Visit (HOSPITAL_COMMUNITY)
Admission: RE | Admit: 2022-01-12 | Discharge: 2022-01-12 | Disposition: A | Discharge status: Home / Self Care | Payer: Medicare HMO | Source: Skilled Nursing Facility | Attending: Adult Health | Admitting: Adult Health

## 2022-01-12 ENCOUNTER — Inpatient Hospital Stay (HOSPITAL_BASED_OUTPATIENT_CLINIC_OR_DEPARTMENT_OTHER): Payer: Medicare HMO | Admitting: Physician Assistant

## 2022-01-12 VITALS — BP 131/59 | HR 93 | Temp 97.5°F | Resp 18 | Ht 65.0 in

## 2022-01-12 DIAGNOSIS — E1122 Type 2 diabetes mellitus with diabetic chronic kidney disease: Secondary | ICD-10-CM | POA: Insufficient documentation

## 2022-01-12 DIAGNOSIS — R7989 Other specified abnormal findings of blood chemistry: Secondary | ICD-10-CM | POA: Diagnosis not present

## 2022-01-12 DIAGNOSIS — D500 Iron deficiency anemia secondary to blood loss (chronic): Secondary | ICD-10-CM

## 2022-01-12 DIAGNOSIS — D509 Iron deficiency anemia, unspecified: Secondary | ICD-10-CM | POA: Diagnosis not present

## 2022-01-12 LAB — HEMOGLOBIN A1C
Hgb A1c MFr Bld: 5.3 % (ref 4.8–5.6)
Mean Plasma Glucose: 105.41 mg/dL

## 2022-01-12 NOTE — Patient Instructions (Signed)
[facility] Discharge Instructions  You were seen today by Rojelio Brenner PA-C for your follow-up visit.  ELEVATED D-DIMER: This is a lab test in your blood that can be elevated if you have blood clots.  In your case, you do not have any signs of blood clots or clotting disorders in your body at this time.  The CT of your abdomen and pelvis did not show any signs of cancer.  There may be other causes of elevated D-dimer including infection, inflammation, and vascular disease.  At this time, we recommend that you STOP taking Eliquis.  We recommend that D-dimer only be checked in the future if you have symptoms of blood clots in your legs or lungs.  IRON DEFICIENCY ANEMIA: Your low iron levels may be due to blood loss from your intestines.  The CT of your abdomen and pelvis did not show any signs of cancer, but you would still benefit from EGD and colonoscopy to see if you have sources of blood loss in your stomach or intestines.  However, since you have declined further work-up, we will continue to offer supportive care with as needed iron infusions.  We will schedule you for IV iron x3 doses.  FOLLOW-UP APPOINTMENT: Repeat labs and office visit in about 3 months.   Thank you for choosing  [facility] to provide your oncology and hematology care.  To afford each patient quality time with our provider, please arrive at least 15 minutes before your scheduled appointment time.   If you have a lab appointment with the Cancer Center please come in thru the Main Entrance and check in at the main information desk.  You need to re-schedule your appointment should you arrive 10 or more minutes late.  We strive to give you quality time with our providers, and arriving late affects you and other patients whose appointments are after yours.  Also, if you no show three or more times for appointments you may be dismissed from the clinic at  the providers discretion.     Again, thank you for choosing [facility].  Our hope is that these requests will decrease the amount of time that you wait before being seen by our physicians.       _____________________________________________________________  Should you have questions after your visit to [facility], please contact our office at [phone] and follow the prompts.  Our office hours are 8:00 a.m. and 4:30 p.m. Monday - Friday.  Please note that voicemails left after 4:00 p.m. may not be returned until the following business day.  We are closed weekends and major holidays.  You do have access to a nurse 24-7, just call the main number to the clinic [phone] and do not press any options, hold on the line and a nurse will answer the phone.    For prescription refill requests, have your pharmacy contact our office and allow 72 hours.    Due to Covid, you will need to wear a mask upon entering the hospital. If you do not have a mask, a mask will be given to you at the Main Entrance upon arrival. For doctor visits, patients may have 1 support person age 18 or older with them. For treatment visits, patients can not have anyone with them due to social distancing guidelines and our immunocompromised population.

## 2022-01-13 LAB — MICROALBUMIN, URINE: Microalb, Ur: 4.8 ug/mL — ABNORMAL HIGH

## 2022-01-20 ENCOUNTER — Inpatient Hospital Stay (HOSPITAL_COMMUNITY): Payer: Medicare HMO

## 2022-01-20 ENCOUNTER — Encounter (HOSPITAL_COMMUNITY): Payer: Self-pay

## 2022-01-20 VITALS — BP 112/78 | HR 92 | Temp 97.9°F | Resp 18

## 2022-01-20 DIAGNOSIS — D500 Iron deficiency anemia secondary to blood loss (chronic): Secondary | ICD-10-CM

## 2022-01-20 DIAGNOSIS — D509 Iron deficiency anemia, unspecified: Secondary | ICD-10-CM | POA: Diagnosis not present

## 2022-01-20 MED ORDER — SODIUM CHLORIDE 0.9 % IV SOLN: Freq: Once | INTRAVENOUS | Status: AC

## 2022-01-20 MED ORDER — SODIUM CHLORIDE 0.9 % IV SOLN
300.0000 mg | Freq: Once | INTRAVENOUS | Status: AC
  Administered 2022-01-20: 300 mg via INTRAVENOUS
  Filled 2022-01-20: qty 300

## 2022-01-20 MED ORDER — ACETAMINOPHEN 325 MG PO TABS
650.0000 mg | ORAL_TABLET | Freq: Once | ORAL | Status: AC
  Administered 2022-01-20: 650 mg via ORAL
  Filled 2022-01-20: qty 2

## 2022-01-20 MED ORDER — LORATADINE 10 MG PO TABS
10.0000 mg | ORAL_TABLET | Freq: Once | ORAL | Status: AC
  Administered 2022-01-20: 10 mg via ORAL
  Filled 2022-01-20: qty 1

## 2022-01-20 NOTE — Progress Notes (Signed)
Patient presents today for Venofer 300 mg IV. Vital signs are stable. Patient denies any changes since the last iron infusion. Patient denies any complaints today. MAR reviewed and updated.    Venofer 300mg  given today per MD orders. Tolerated infusion without adverse affects. Vital signs stable. No complaints at this time. Discharged from clinic by wheel chair in stable condition. Alert and oriented x 3. F/U with [facility] as scheduled.

## 2022-01-20 NOTE — Patient Instructions (Signed)
[facility]  Discharge Instructions: Thank you for choosing [facility] to provide your oncology and hematology care.  If you have a lab appointment with the Cancer Center, please come in thru the Main Entrance and check in at the main information desk.  Wear comfortable clothing and clothing appropriate for easy access to any Portacath or PICC line.   We strive to give you quality time with your provider. You may need to reschedule your appointment if you arrive late (15 or more minutes).  Arriving late affects you and other patients whose appointments are after yours.  Also, if you miss three or more appointments without notifying the office, you may be dismissed from the clinic at the provider's discretion.      For prescription refill requests, have your pharmacy contact our office and allow 72 hours for refills to be completed.    Today you received the following chemotherapy and/or immunotherapy agents Venofer 300 mg.  Iron Sucrose Injection What is this medication? IRON SUCROSE (EYE ern SOO krose) treats low levels of iron (iron deficiency anemia) in people with kidney disease. Iron is a mineral that plays an important role in making red blood cells, which carry oxygen from your lungs to the rest of your body. This medicine may be used for other purposes; ask your health care provider or pharmacist if you have questions. COMMON BRAND NAME(S): Venofer What should I tell my care team before I take this medication? They need to know if you have any of these conditions: Anemia not caused by low iron levels Heart disease High levels of iron in the blood Kidney disease Liver disease An unusual or allergic reaction to iron, other medications, foods, dyes, or preservatives Pregnant or trying to get pregnant Breast-feeding How should I use this medication? This medication is for infusion into a vein. It is given in a hospital or clinic setting. Talk to your care  team about the use of this medication in children. While this medication may be prescribed for children as young as 2 years for selected conditions, precautions do apply. Overdosage: If you think you have taken too much of this medicine contact a poison control center or emergency room at once. NOTE: This medicine is only for you. Do not share this medicine with others. What if I miss a dose? It is important not to miss your dose. Call your care team if you are unable to keep an appointment. What may interact with this medication? Do not take this medication with any of the following: Deferoxamine Dimercaprol Other iron products This medication may also interact with the following: Chloramphenicol Deferasirox This list may not describe all possible interactions. Give your health care provider a list of all the medicines, herbs, non-prescription drugs, or dietary supplements you use. Also tell them if you smoke, drink alcohol, or use illegal drugs. Some items may interact with your medicine. What should I watch for while using this medication? Visit your care team regularly. Tell your care team if your symptoms do not start to get better or if they get worse. You may need blood work done while you are taking this medication. You may need to follow a special diet. Talk to your care team. Foods that contain iron include: whole grains/cereals, dried fruits, beans, or peas, leafy green vegetables, and organ meats (liver, kidney). What side effects may I notice from receiving this medication? Side effects that you should report to your care team as soon   as possible: Allergic reactions--skin rash, itching, hives, swelling of the face, lips, tongue, or throat Low blood pressure--dizziness, feeling faint or lightheaded, blurry vision Shortness of breath Side effects that usually do not require medical attention (report to your care team if they continue or are bothersome): Flushing Headache Joint  pain Muscle pain Nausea Pain, redness, or irritation at injection site This list may not describe all possible side effects. Call your doctor for medical advice about side effects. You may report side effects to FDA at 1-800-FDA-1088. Where should I keep my medication? This medication is given in a hospital or clinic and will not be stored at home. NOTE: This sheet is a summary. It may not cover all possible information. If you have questions about this medicine, talk to your doctor, pharmacist, or health care provider.  2023 Elsevier/Gold Standard (2020-12-03 00:00:00)       To help prevent nausea and vomiting after your treatment, we encourage you to take your nausea medication as directed.  BELOW ARE SYMPTOMS THAT SHOULD BE REPORTED IMMEDIATELY: *FEVER GREATER THAN 100.4 F (38 C) OR HIGHER *CHILLS OR SWEATING *NAUSEA AND VOMITING THAT IS NOT CONTROLLED WITH YOUR NAUSEA MEDICATION *UNUSUAL SHORTNESS OF BREATH *UNUSUAL BRUISING OR BLEEDING *URINARY PROBLEMS (pain or burning when urinating, or frequent urination) *BOWEL PROBLEMS (unusual diarrhea, constipation, pain near the anus) TENDERNESS IN MOUTH AND THROAT WITH OR WITHOUT PRESENCE OF ULCERS (sore throat, sores in mouth, or a toothache) UNUSUAL RASH, SWELLING OR PAIN  UNUSUAL VAGINAL DISCHARGE OR ITCHING   Items with * indicate a potential emergency and should be followed up as soon as possible or go to the Emergency Department if any problems should occur.  Please show the CHEMOTHERAPY ALERT CARD or IMMUNOTHERAPY ALERT CARD at check-in to the Emergency Department and triage nurse.  Should you have questions after your visit or need to cancel or reschedule your appointment, please contact [facility] [phone]  and follow the prompts.  Office hours are 8:00 a.m. to 4:30 p.m. Monday - Friday. Please note that voicemails left after 4:00 p.m. may not be returned until the following business day.  We are closed weekends  and major holidays. You have access to a nurse at all times for urgent questions. Please call the main number to the clinic [phone] and follow the prompts.  For any non-urgent questions, you may also contact your provider using MyChart. We now offer e-Visits for anyone 18 and older to request care online for non-urgent symptoms. For details visit [email or web address].   Also download the MyChart app! Go to the app store, search "MyChart", open the app, select , and log in with your MyChart username and password.  Masks are optional in the cancer centers. If you would like for your care team to wear a mask while they are taking care of you, please let them know. For doctor visits, patients may have with them one support person who is at least 85 years old. At this time, visitors are not allowed in the infusion area.  

## 2022-01-29 NOTE — Progress Notes (Deleted)
Referring Provider: Sharee Holster, NP Primary Care Physician:  Sharee Holster, NP Primary GI Physician: Dr. Marletta Lor  No chief complaint on file.   HPI:   Anna Hanna is a 87 y.o. female presenting today  for follow-up of anemia and constipation.   Last seen in our office in January 2023 at the request of Synthia Innocent at [facility] for heme positive. Noted patient had chronic anemia with most recent hemogobin 9.3. Chronically on  Eliquis for hypercoagulable disorder. Recently had heme positive stool, but denied overt GI bleeding. Admitted to constipation but this was well controlled on MiraLAX. No other significant symptoms or alarm symptoms. Last colonoscopy 2015 unremarkable. Discussion was had regarding colonoscopy vs conservative management and patient preferred conservative management. Agreed with referral to hematology which was already  placed by [facility]   She established with hematology in February for elevated D diber and anemia. Found to have iron deficiency on 08/26/2021 with ferritin of 10, iron 18, saturation 5%.  She has received 3 iron infusions, last on 01/20/2022. Regarding elevated D dimer, she completed additional work-up without a specific explanation and ultimately recommended discontinuing Eliquis at her last visit  on 01/12/22. Most recent Hgb 11.7 on 6/14. CT A/P 01/04/22 with no obvious malignancy. Patient requested to continue supportive treatment with iron replacement and declined further work-up of IDA.   Today:    Past Medical History:  Diagnosis Date   Abnormal posture    Per [facility] Electronic Medical Records System    Anemia    Cognitive communication deficit    Per [facility] Records [facility]    Contracture of left knee    Per [facility] Records [facility]    Diabetes mellitus without complication (HCC)    Difficulty walking    Per [facility] Records [facility]    Extended spectrum beta lactamase (ESBL) resistance    Per [facility] Electronic [facility] Records [facility]    Gastroesophageal reflux disease    Per [facility] Records [facility]    Glaucoma    Gram negative sepsis (HCC)    Per [facility] Records [facility]    Hypercholesteremia    Hypertension    Hypokalemia    Infection, bacterial    Per [facility] Records [facility]    Iron deficiency anemia due to chronic blood loss 11/23/2021   Muscle weakness (generalized)    Per [facility] Records [facility]    Need for assistance with personal care    Per [facility] Records [facility]    OAB (overactive bladder)    Per [facility]    Ocular hypertension, bilateral    Per [facility] Records [facility]    Osteoarthritis of right knee    Per [facility] Records [facility]    Paraplegia [facility])    Pyelonephritis, acute    Per [facility] Records [facility]    Status post total right knee replacement    Unspecified dementia without behavioral disturbance    Per [facility] Records [facility]    Urgency of urination    Per [facility]    Wheelchair  dependence    Per [facility] Records [facility]     Past Surgical History:  Procedure Laterality Date   ABDOMINAL HYSTERECTOMY     CATARACT EXTRACTION     COLONOSCOPY N/A 12/10/2013   Procedure: COLONOSCOPY;  Surgeon: West Bali, MD;  Location: [facility];  Service: Endoscopy;  Laterality: N/A;  8:30 AM   TOTAL KNEE ARTHROPLASTY Right 03/30/2016    Procedure: TOTAL KNEE ARTHROPLASTY;  Surgeon: Vickki Hearing, MD;  Location: [facility] ORS;  Service: Orthopedics;  Laterality: Right;    No current outpatient medications on file.   No current facility-administered medications for this visit.    Allergies as of 01/30/2022   (No Known Allergies)    Family History  Problem Relation Age of Onset   Cancer Mother    Arthritis Mother    Hypotension Father    Cancer Sister    Hypotension Sister    Colon cancer Neg Hx     Social History   Socioeconomic History   Marital status: Divorced    Spouse name: Not on file   Number of children: Not on file   Years of education: Not on file   Highest education level: Not on file  Occupational History   Occupation: retired   Tobacco Use   Smoking status: Former    Packs/day: 0.25    Years: 3.00    Total pack years: 0.75    Types: Cigarettes   Smokeless tobacco: Never  Vaping Use   Vaping Use: Never used  Substance and Sexual Activity   Alcohol use: No   Drug use: No   Sexual activity: Not Currently    Birth control/protection: Post-menopausal  Other Topics Concern   Not on file  Social History Narrative   Long term resident of [facility]    Social Determinants of Health   Financial Resource Strain: Not on file  Food Insecurity: Not on file  Transportation Needs: Not on file  Physical Activity: Inactive (02/17/2020)   Exercise Vital Sign    Days of Exercise per Week: 0 days    Minutes of Exercise per Session: 0 min  Stress: Not on file  Social Connections: Not on file    Review of Systems: Gen: Denies fever, chills, cold or flu like symptoms, pre-syncope, or syncope.    CV: Denies chest pain, palpitations. Resp: Denies dyspnea, cough.  GI:See HPI Heme: See HPI  Physical Exam: There were no vitals taken for this visit. General:   Alert and oriented. No distress noted. Pleasant and cooperative.  Head:  Normocephalic and atraumatic. Eyes:  Conjuctiva clear without scleral  icterus. Heart:  S1, S2 present without murmurs appreciated. Lungs:  Clear to auscultation bilaterally. No wheezes, rales, or rhonchi. No distress.  Abdomen:  +BS, soft, non-tender and non-distended. No rebound or guarding. No HSM or masses noted. Msk:  Symmetrical without gross deformities. Normal posture. Extremities:  Without edema. Neurologic:  Alert and  oriented x4 Psych:  Normal mood and affect.    Assessment:     Plan:  ***   Ermalinda Memos, PA-C [facility] 01/30/2022

## 2022-01-30 ENCOUNTER — Ambulatory Visit: Payer: Medicare HMO | Admitting: Gastroenterology

## 2022-01-30 ENCOUNTER — Encounter: Payer: Self-pay | Admitting: Internal Medicine

## 2022-01-30 ENCOUNTER — Non-Acute Institutional Stay (SKILLED_NURSING_FACILITY): Payer: Medicare HMO | Admitting: Internal Medicine

## 2022-01-30 DIAGNOSIS — D649 Anemia, unspecified: Secondary | ICD-10-CM

## 2022-01-30 DIAGNOSIS — J301 Allergic rhinitis due to pollen: Secondary | ICD-10-CM

## 2022-01-30 DIAGNOSIS — N3281 Overactive bladder: Secondary | ICD-10-CM

## 2022-01-30 DIAGNOSIS — J309 Allergic rhinitis, unspecified: Secondary | ICD-10-CM | POA: Insufficient documentation

## 2022-01-30 NOTE — Assessment & Plan Note (Signed)
01/04/2022 H/H 11.7/40.9 up from prior values of 9.9/32.9.  Normocytic, hypochromic indices.  Iron level normal at 44.  No bleeding dyscrasias reported. GI evaluation 7/10 rescheduled to 7/31 as family could not be contacted to arrange transport.Colonoscopy in 2015 revealed redundant colon w/o polyps.

## 2022-01-30 NOTE — Assessment & Plan Note (Signed)
She was previously on Ditropan and is now on Myrbetriq 25 mg daily.  Consideration will be given to increasing the dose to 50 mg daily which is max dose.

## 2022-01-30 NOTE — Assessment & Plan Note (Addendum)
See 01/30/2022 she describes nasal congestion and wheezing with exposure to pollen.  Serial eosinophil counts have been low normal.  Consider trial of loratadine.

## 2022-01-30 NOTE — Progress Notes (Unsigned)
NURSING HOME LOCATION:  [facility] ROOM NUMBER:  [id number]  CODE STATUS:  Full Code  PCP:  Synthia Innocent NP  This is a nursing facility follow up visit of chronic medical diagnoses & to document compliance with Regulation 483.30 (c) in The Long Term Care Survey Manual Phase 2 which mandates caregiver visit ( visits can alternate among physician, PA or NP as per statutes) within 10 days of 30 days / 60 days/ 90 days post admission to SNF date    Interim medical record and care since last SNF visit was updated with review of diagnostic studies and change in clinical status since last visit were documented.  HPI: She is a permanent resident of the facility with medical diagnoses of essential hypertension, aortic atherosclerosis, GERD without esophagitis, OAB, protein/caloric malnutrition, prediabetes, iron deficiency anemia due to chronic blood loss, and history of tachycardia.  Labs are current as of June.  On 01/12/2022 urine microalbumin was 4.8 down from a prior value of 6.4.  A1c was prediabetic at 5.3% with prior value of 6%.  On 6/14 creatinine was 0.63 with a GFR greater than 60 indicating CKD stage II.  Total protein and albumin were normal with values of 7.7 and 3.6 respectively.  H/H was 11.7/40.9 with prior values of 9.9/32.9.  Indices are normocytic, hypochromic.  Iron level was normal at 44 with normals of 28-170.  On 08/26/2021 B12 level was low normal at 314; prior values had been 464.  As noted there was no macrocytosis.  Eosinophil count has been normal.  TSH was completed 06/20/2021 and was therapeutic with a value of 1.144.  Review of systems: Her major complaint is urinary frequency.  She repeatedly ask for medication to "slow this down."  Previously she was on Ditropan; Myrbetriq 25 mg is listed on the med list.  She denies other GU symptoms.  She also describes seasonal allergies apparently related blooming flowers manifested as nasal congestion and intermittent  wheezing.  She has no upper respiratory tract infection symptoms and no other extrinsic symptoms.  Constitutional: No fever, significant weight change, fatigue  Eyes: No redness, discharge, pain, vision change ENT/mouth: No nasal congestion,  purulent discharge, earache, change in hearing, sore throat  Cardiovascular: No chest pain, palpitations, paroxysmal nocturnal dyspnea, claudication, edema  Respiratory: No cough, sputum production, hemoptysis, DOE, significant snoring, apnea   Gastrointestinal: No heartburn, dysphagia, abdominal pain, nausea /vomiting, rectal bleeding, melena, change in bowels Genitourinary: No dysuria, hematuria, pyuria, incontinence, nocturia Musculoskeletal: No joint stiffness, joint swelling, weakness, pain Dermatologic: No rash, pruritus, change in appearance of skin Neurologic: No dizziness, headache, syncope, seizures, numbness, tingling Psychiatric: No significant anxiety, depression, insomnia, anorexia Endocrine: No change in hair/skin/nails, excessive thirst, excessive hunger, excessive urination  Hematologic/lymphatic: No significant bruising, lymphadenopathy, abnormal bleeding Allergy/immunology: No itchy/watery eyes, significant sneezing, urticaria, angioedema  Physical exam:  Pertinent or positive findings: She has slight of hearing.  She has a dentulous.  Slight tachycardia is present.  She has minor low-grade rales in the left posterior chest greater than the right.  Abdomen is protuberant.  Pedal pulses are decreased.  She has 1/2+ edema at the sock line.  There is significant flexion contracture of the right knee. General appearance: Adequately nourished; no acute distress, increased work of breathing is present.   Lymphatic: No lymphadenopathy about the head, neck, axilla. Eyes: No conjunctival inflammation or lid edema is present. There is no scleral icterus. Ears:  External ear exam shows no significant lesions  or deformities.   Nose:  External nasal  examination shows no deformity or inflammation. Nasal mucosa are pink and moist without lesions, exudates Oral exam:  Lips and gums are healthy appearing. There is no oropharyngeal erythema or exudate. Neck:  No thyromegaly, masses, tenderness noted.    Heart:  Normal rate and regular rhythm. S1 and S2 normal without gallop, murmur, click, rub .  Lungs: Chest clear to auscultation without wheezes, rhonchi, rales, rubs. Abdomen: Bowel sounds are normal. Abdomen is soft and nontender with no organomegaly, hernias, masses. GU: Deferred  Extremities:  No cyanosis, clubbing, edema  Neurologic exam : Cn 2-7 intact Strength equal  in upper & lower extremities Balance, Rhomberg, finger to nose testing could not be completed due to clinical state Deep tendon reflexes are equal Skin: Warm & dry w/o tenting. No significant lesions or rash.  See summary under each active problem in the Problem List with associated updated therapeutic plan

## 2022-01-30 NOTE — Patient Instructions (Signed)
See assessment and plan under each diagnosis in the problem list and acutely for this visit 

## 2022-01-31 ENCOUNTER — Encounter: Payer: Self-pay | Admitting: Internal Medicine

## 2022-02-03 ENCOUNTER — Inpatient Hospital Stay (HOSPITAL_COMMUNITY): Payer: Medicare HMO

## 2022-02-10 ENCOUNTER — Inpatient Hospital Stay (HOSPITAL_COMMUNITY): Payer: Medicare HMO | Attending: Hematology

## 2022-02-10 VITALS — BP 133/78 | HR 88 | Temp 96.6°F | Resp 18

## 2022-02-10 DIAGNOSIS — D500 Iron deficiency anemia secondary to blood loss (chronic): Secondary | ICD-10-CM | POA: Insufficient documentation

## 2022-02-10 DIAGNOSIS — D75839 Thrombocytosis, unspecified: Secondary | ICD-10-CM | POA: Diagnosis not present

## 2022-02-10 DIAGNOSIS — R7989 Other specified abnormal findings of blood chemistry: Secondary | ICD-10-CM | POA: Insufficient documentation

## 2022-02-10 MED ORDER — ACETAMINOPHEN 325 MG PO TABS
650.0000 mg | ORAL_TABLET | Freq: Once | ORAL | Status: AC
  Administered 2022-02-10: 650 mg via ORAL
  Filled 2022-02-10: qty 2

## 2022-02-10 MED ORDER — LORATADINE 10 MG PO TABS
10.0000 mg | ORAL_TABLET | Freq: Once | ORAL | Status: AC
  Administered 2022-02-10: 10 mg via ORAL
  Filled 2022-02-10: qty 1

## 2022-02-10 MED ORDER — SODIUM CHLORIDE 0.9 % IV SOLN
300.0000 mg | Freq: Once | INTRAVENOUS | Status: AC
  Administered 2022-02-10: 300 mg via INTRAVENOUS
  Filled 2022-02-10: qty 300

## 2022-02-10 MED ORDER — SODIUM CHLORIDE 0.9 % IV SOLN: Freq: Once | INTRAVENOUS | Status: AC

## 2022-02-10 NOTE — Patient Instructions (Signed)
[facility]  Discharge Instructions: Thank you for choosing [facility] to provide your oncology and hematology care.  If you have a lab appointment with the Cancer Center, please come in thru the Main Entrance and check in at the main information desk.  Wear comfortable clothing and clothing appropriate for easy access to any Portacath or PICC line.   We strive to give you quality time with your provider. You may need to reschedule your appointment if you arrive late (15 or more minutes).  Arriving late affects you and other patients whose appointments are after yours.  Also, if you miss three or more appointments without notifying the office, you may be dismissed from the clinic at the provider's discretion.      For prescription refill requests, have your pharmacy contact our office and allow 72 hours for refills to be completed.    Today you received the following chemotherapy and/or immunotherapy agents Venofer      To help prevent nausea and vomiting after your treatment, we encourage you to take your nausea medication as directed.  BELOW ARE SYMPTOMS THAT SHOULD BE REPORTED IMMEDIATELY: *FEVER GREATER THAN 100.4 F (38 C) OR HIGHER *CHILLS OR SWEATING *NAUSEA AND VOMITING THAT IS NOT CONTROLLED WITH YOUR NAUSEA MEDICATION *UNUSUAL SHORTNESS OF BREATH *UNUSUAL BRUISING OR BLEEDING *URINARY PROBLEMS (pain or burning when urinating, or frequent urination) *BOWEL PROBLEMS (unusual diarrhea, constipation, pain near the anus) TENDERNESS IN MOUTH AND THROAT WITH OR WITHOUT PRESENCE OF ULCERS (sore throat, sores in mouth, or a toothache) UNUSUAL RASH, SWELLING OR PAIN  UNUSUAL VAGINAL DISCHARGE OR ITCHING   Items with * indicate a potential emergency and should be followed up as soon as possible or go to the Emergency Department if any problems should occur.  Please show the CHEMOTHERAPY ALERT CARD or IMMUNOTHERAPY ALERT CARD at check-in to the Emergency  Department and triage nurse.  Should you have questions after your visit or need to cancel or reschedule your appointment, please contact  CANCER CENTER [phone]  and follow the prompts.  Office hours are 8:00 a.m. to 4:30 p.m. Monday - Friday. Please note that voicemails left after 4:00 p.m. may not be returned until the following business day.  We are closed weekends and major holidays. You have access to a nurse at all times for urgent questions. Please call the main number to the clinic [phone] and follow the prompts.  For any non-urgent questions, you may also contact your provider using MyChart. We now offer e-Visits for anyone 18 and older to request care online for non-urgent symptoms. For details visit [email or web address].   Also download the MyChart app! Go to the app store, search "MyChart", open the app, select [facility], and log in with your MyChart username and password.  Masks are optional in the cancer centers. If you would like for your care team to wear a mask while they are taking care of you, please let them know. For doctor visits, patients may have with them one support person who is at least 85 years old. At this time, visitors are not allowed in the infusion area.  

## 2022-02-10 NOTE — Progress Notes (Signed)
Patient presents today for Venofer infusion per providers order.  Vital signs WNL.  Patient has no new complaints ta this time.    Peripheral IV started and blood return noted pre infusion.  Patient complaining of IV hurting and and it tender to the touch.  Attempts to restart IV failed.  Patient only has approximately  55 mg left of the Venofer infusion.  PA notified.  Per PA patient okay to discharge without finishing Venofer.  Vital signs stable.  No complaints at this time.  Discharge from clinic via wheelchair in stable condition.  Alert and oriented X 3.  Follow up with [facility] as scheduled.

## 2022-02-14 ENCOUNTER — Encounter: Payer: Self-pay | Admitting: Adult Health

## 2022-02-14 ENCOUNTER — Non-Acute Institutional Stay (SKILLED_NURSING_FACILITY): Payer: Medicare HMO | Admitting: Adult Health

## 2022-02-14 DIAGNOSIS — N3281 Overactive bladder: Secondary | ICD-10-CM

## 2022-02-14 NOTE — Progress Notes (Unsigned)
Location:  [facility] Room Number: 105-D Place of Service:  SNF (31)   CODE STATUS: Full Code   No Known Allergies  Chief Complaint  Patient presents with   Acute Visit    Overactive bladder    HPI:  She is presently on myrbetriq 25 mg daily for her urinary urgency. She continues to complain of urinary urgency and urinary incontinence. She denies any dysuria or flank pain. There are no fevers present.    Past Medical History:  Diagnosis Date   Abnormal posture    Per [facility], [facility] Records [facility]    Anemia    Cognitive communication deficit    Per [facility], [facility]    Contracture of left knee    Per [facility], [facility]    Diabetes mellitus without complication (HCC)    Difficulty walking    Per [facility], [facility]    Extended spectrum beta lactamase (ESBL) resistance    Per [facility], [facility]    Gastroesophageal reflux disease    Per [facility], [facility]    Glaucoma    Gram negative sepsis (HCC)    Per [facility], [facility]    Hypercholesteremia    Hypertension    Hypokalemia    Infection, bacterial    Per [facility], [facility]    Iron deficiency anemia due to chronic blood loss 11/23/2021   Muscle weakness (generalized)    Per [facility], [facility]    Need for assistance with personal care    Per [facility], [facility]    OAB (overactive bladder)    Per [facility], [facility]    Ocular hypertension, bilateral    Per [facility], [facility]     Osteoarthritis of right knee    Per [facility], [facility]    Paraplegia [facility])    Pyelonephritis, acute    Per [facility], [facility]    Status post total right knee replacement    Unspecified dementia without behavioral disturbance    Per [facility], [facility]    Urgency of urination    Per [facility], [facility]    Wheelchair dependence    Per Ria Bush, [facility] Records [facility]     Past Surgical History:  Procedure Laterality Date   ABDOMINAL HYSTERECTOMY     CATARACT EXTRACTION     COLONOSCOPY N/A 12/10/2013   [place] Bali, MD ,Gi revealed redundant colon   TOTAL KNEE ARTHROPLASTY Right 03/30/2016   Procedure: TOTAL KNEE ARTHROPLASTY;  Surgeon: Vickki Hearing, MD;  Location: [facility];  Service: Orthopedics;  Laterality: Right;    Social History   Socioeconomic History   Marital status: Divorced    Spouse name: Not on file   Number of children: Not on file   Years of education: Not on file   Highest education level: Not on file  Occupational History   Occupation: retired   Tobacco Use   Smoking status: Former    Packs/day: 0.25    Years: 3.00    Total pack  years: 0.75    Types: Cigarettes   Smokeless tobacco: Never  Vaping Use   Vaping Use: Never used  Substance and Sexual Activity   Alcohol use: No   Drug use: No   Sexual activity: Not Currently    Birth control/protection: Post-menopausal  Other Topics Concern   Not on file  Social History Narrative   Long term resident of [facility]    Social Determinants of Health   Financial Resource Strain: Not on file  Food Insecurity: Not on file  Transportation Needs: Not on file  Physical Activity: Inactive (02/17/2020)   Exercise Vital Sign    Days of Exercise per Week: 0 days    Minutes of Exercise per Session: 0 min   Stress: Not on file  Social Connections: Not on file  Intimate Partner Violence: Not At Risk (02/17/2020)   Humiliation, Afraid, Rape, and Kick questionnaire    Fear of Current or Ex-Partner: No    Emotionally Abused: No    Physically Abused: No    Sexually Abused: No   Family History  Problem Relation Age of Onset   Cancer Mother    Arthritis Mother    Hypotension Father    Cancer Sister    Hypotension Sister    Colon cancer Neg Hx       VITAL SIGNS BP 108/73   Pulse 78   Temp 97.8 F (36.6 C)   Resp (!) 22   Ht 5\' 5"  (1.651 m)   Wt 211 lb (95.7 kg)   SpO2 97%   BMI 35.11 kg/m   Outpatient Encounter Medications as of 02/14/2022  Medication Sig   acetaminophen (TYLENOL) 325 MG tablet Take 650 mg by mouth 2 (two) times daily.   ascorbic acid (VITAMIN C) 500 MG tablet Take 500 mg by mouth daily.   calcium citrate (CALCITRATE - DOSED IN MG ELEMENTAL CALCIUM) 950 MG tablet Take 200 mg of elemental calcium by mouth daily.   ferrous sulfate 325 (65 FE) MG EC tablet Take 325 mg by mouth 2 (two) times a week. Monday and Thursday   guaiFENesin-dextromethorphan (ROBITUSSIN DM) 100-10 MG/5ML syrup Take 15 mLs by mouth every 6 (six) hours as needed for cough.   latanoprost (XALATAN) 0.005 % ophthalmic solution Place 1 drop into both eyes at bedtime. wait 5 minutes between multiple eye drops   mirabegron ER (MYRBETRIQ) 50 MG TB24 tablet Take 50 mg by mouth daily.   NON FORMULARY Diet: NAS,   omeprazole (PRILOSEC) 20 MG capsule Take 20 mg by mouth 2 (two) times daily before a meal. Special Instructions: *TAKE ON AN EMPTY STOMACH* FOR GERD *DO NOT CRUSH* (FORMULARY SUB FOR PANTOPRAZOLE 40MG )   polyethylene glycol powder (GLYCOLAX/MIRALAX) 17 GM/SCOOP powder Take by mouth daily as needed.   [DISCONTINUED] diclofenac Sodium (VOLTAREN) 1 % GEL Apply 4 g topically 3 (three) times daily as needed. Special Instructions: apply to right knee   [DISCONTINUED] diclofenac Sodium (VOLTAREN) 1 %  GEL Apply 2 g topically 2 (two) times daily as needed. Special Instructions: apply to left index finger for arthritis pain   [DISCONTINUED] iron polysaccharides (NIFEREX) 150 MG capsule Take 1 capsule by mouth daily.   [DISCONTINUED] mirabegron ER (MYRBETRIQ) 25 MG TB24 tablet Take 25 mg by mouth daily.   No facility-administered encounter medications on file as of 02/14/2022.     SIGNIFICANT DIAGNOSTIC EXAMS   PREVIOUS  02-05-20: DEXA: t score: -2.279  01-10-21: kub: significant fecal material identified.   01-17-21: chest x-ray: no  evidence of acute cardiopulmonary disease; communicable  disease or tuberculosis   02-18-21: ct angio of chest:  1. Negative for acute PE or thoracic aortic dissection. 2.  Aortic Atherosclerosis   02-24-21: bilateral lower extremity venous doppler:  No evidence of deep venous thrombosis in either lower extremity.   02-24-21: KUB Moderate colonic stool burden. No evidence of bowel obstruction.  05-05-21: ct angio of chest:  1. No CT evidence for acute pulmonary embolus. 2. Bilateral lower lobe bronchial wall thickening with scattered areas of peripheral airway impaction. Imaging features suggest chronic infectious/inflammatory etiology. 3. Aortic Atherosclerosis   05-19-21: right lower extremity venous doppler:  1. No evidence of deep vein thrombosis within the visualized veins of the right lower extremity. If there are persistent symptoms, repeat venous Doppler examination may be obtained in 3 to 5 days.  2. Right mid femoral and right distal femoral veins not well visualized due to body habitus and patient could not tolerate compression.  NO NEW EXAMS.    LABS REVIEWED PREVIOUS;     01-24-21: d-dimer: 6.73  01-31-21: d-dimer: 5.30 02-07-21: d-dimer: 6.25 02-14-21: d-dimer: 12.74 02-24-21: d-dimer: >20 03-01-21: d-dimer: 14.18 03-10-21: d-dimer: 11.10 03-17-21; d-dimer: 10.44 03-22-21: d-dimer: 10.32  03-29-21: d-dimer: 10.97 04-11-21: d-dimer  12.12 04-25-21: d-dimer: 7.42   05-09-21: d-dimer: 5.60  05-19-21: d-dimer: 5.63 05-24-21; wbc 6.6; hgb 8.9; hct 30.4; mcv 81.1 plt 453 ;glucose 92; bun 16; creat 0.46; k+ 3.8; na++ 137; ca 8.5; GFR >60; liver normal albumin 3.1  05-27-21: guaiac negative 06-03-21: d-dimer: 6.31  06-20-21: wbc 6.3; hgb 9,3; hct 30.8; mcv 79.0 plt 431; tsh 1.144 hgb a1c 6.0  d-dimer 7.14 12/2; 06/29/21: guaiac +  07-04-21 d-dimer: 4.35 08-26-21; wbc 5.8; hgb 9.9; hct 32.9; mcv 77.4 plt 410; d-dimer 13.43; factor 5: 114 (n); factor 8: 233 (n) Fibrinogen 477 (ele) INR 1.4; folate 16.5; vitamin B 12: 314; ferritin 10; iron 18; tibc 361;  11-21-21: d-dimer: >20 01-04-22: wbc 5.4; hgb 11.7; hct 40.9; mcv 80.2 plt 280; glucose 88 bun 14; creat 0.63; k+ 4.8; na++ 134; ca 8.8; gfr>60; protein 7.9; albumin 3.6; iron 44; tibc 308; ferritin 90   NO NEW LABS.   Review of Systems  Constitutional:  Negative for malaise/fatigue.  Respiratory:  Negative for cough and shortness of breath.   Cardiovascular:  Negative for chest pain, palpitations and leg swelling.  Gastrointestinal:  Negative for abdominal pain, constipation and heartburn.  Genitourinary:  Positive for urgency.  Musculoskeletal:  Negative for back pain, joint pain and myalgias.  Skin: Negative.   Neurological:  Negative for dizziness.  Psychiatric/Behavioral:  The patient is not nervous/anxious.    Physical Exam Constitutional:      General: She is not in acute distress.    Appearance: She is well-developed. She is obese. She is not diaphoretic.  Neck:     Thyroid: No thyromegaly.  Cardiovascular:     Rate and Rhythm: Normal rate and regular rhythm.     Pulses: Normal pulses.     Heart sounds: Normal heart sounds.  Pulmonary:     Effort: Pulmonary effort is normal. No respiratory distress.     Breath sounds: Normal breath sounds.  Abdominal:     General: Bowel sounds are normal. There is no distension.     Palpations: Abdomen is soft.     Tenderness:  There is no abdominal tenderness.  Musculoskeletal:     Cervical back: Neck supple.     Right lower leg: No edema.  Left lower leg: No edema.     Comments: Is able to move all extremities History of right knee replacement with contracture      Lymphadenopathy:     Cervical: No cervical adenopathy.  Skin:    General: Skin is warm and dry.  Neurological:     Mental Status: She is alert. Mental status is at baseline.       ASSESSMENT/ PLAN:  TODAY  OAB (overactive bladder) current treatment not effective will begin myrbetriq 50 mg daily    Synthia Innocent NP [facility]  call [phone]

## 2022-02-17 ENCOUNTER — Encounter: Payer: Self-pay | Admitting: Adult Health

## 2022-02-17 ENCOUNTER — Non-Acute Institutional Stay (SKILLED_NURSING_FACILITY): Payer: Medicare HMO | Admitting: Adult Health

## 2022-02-17 ENCOUNTER — Encounter (HOSPITAL_COMMUNITY): Payer: Self-pay

## 2022-02-17 ENCOUNTER — Inpatient Hospital Stay (HOSPITAL_COMMUNITY): Payer: Medicare HMO

## 2022-02-17 VITALS — BP 112/62 | HR 84 | Temp 97.0°F | Resp 18

## 2022-02-17 DIAGNOSIS — F01B0 Vascular dementia, moderate, without behavioral disturbance, psychotic disturbance, mood disturbance, and anxiety: Secondary | ICD-10-CM

## 2022-02-17 DIAGNOSIS — E1322 Other specified diabetes mellitus with diabetic chronic kidney disease: Secondary | ICD-10-CM

## 2022-02-17 DIAGNOSIS — D500 Iron deficiency anemia secondary to blood loss (chronic): Secondary | ICD-10-CM | POA: Diagnosis not present

## 2022-02-17 DIAGNOSIS — I700 Atherosclerosis of aorta: Secondary | ICD-10-CM | POA: Diagnosis not present

## 2022-02-17 DIAGNOSIS — N182 Chronic kidney disease, stage 2 (mild): Secondary | ICD-10-CM

## 2022-02-17 DIAGNOSIS — E441 Mild protein-calorie malnutrition: Secondary | ICD-10-CM

## 2022-02-17 DIAGNOSIS — I129 Hypertensive chronic kidney disease with stage 1 through stage 4 chronic kidney disease, or unspecified chronic kidney disease: Secondary | ICD-10-CM

## 2022-02-17 MED ORDER — SODIUM CHLORIDE 0.9 % IV SOLN
300.0000 mg | Freq: Once | INTRAVENOUS | Status: AC
  Administered 2022-02-17: 300 mg via INTRAVENOUS
  Filled 2022-02-17: qty 300

## 2022-02-17 MED ORDER — SODIUM CHLORIDE 0.9 % IV SOLN: Freq: Once | INTRAVENOUS | Status: AC

## 2022-02-17 MED ORDER — LORATADINE 10 MG PO TABS
10.0000 mg | ORAL_TABLET | Freq: Once | ORAL | Status: AC
  Administered 2022-02-17: 10 mg via ORAL
  Filled 2022-02-17: qty 1

## 2022-02-17 MED ORDER — ACETAMINOPHEN 325 MG PO TABS
650.0000 mg | ORAL_TABLET | Freq: Once | ORAL | Status: AC
  Administered 2022-02-17: 650 mg via ORAL
  Filled 2022-02-17: qty 2

## 2022-02-17 NOTE — Progress Notes (Signed)
Location:  [place] Room Number: 105 D Place of Service:  SNF (31) Provider: Ok Edwards, NP   CODE STATUS: FULL CODE  No Known Allergies  Chief Complaint  Patient presents with   Acute Visit    Care plan meeting    HPI:  We have come together for her care plan meeting. BIMS 10/15 mood 1/30: nervous at times. Is nonambulatory propels self in wheelchair; no falls. She requires extensive assist with her adls. She is incontinent of bladder and bowel. Dietary: weight is 210 pounds NAS diet good appetite feeds self. Therapy: none at this time. She has a GI follow up on 02-20-22. Therapy: none at this time. She does attend activities. 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 mood disturbance or anxiety  Mild protein calorie malnutrition   Past Medical History:  Diagnosis Date   Abnormal posture    Per [facility], [facility] Records [facility]    Anemia    Cognitive communication deficit    Per [facility], [facility] Records [facility]    Contracture of left knee    Per [facility], [facility] Records [facility]    Diabetes mellitus without complication ([place])    Difficulty walking    Per [facility], [facility] Records [facility]    Extended spectrum beta lactamase (ESBL) resistance    Per [facility], [facility] Records [facility]    Gastroesophageal reflux disease    Per [facility], [facility] Records [facility]    Glaucoma    Gram negative sepsis ([place])    Per [facility], [facility] Records [facility]    Hypercholesteremia    Hypertension    Hypokalemia    Infection, bacterial    Per [facility], [facility] Electronic  [facility] Records System    Iron deficiency anemia due to chronic blood loss 11/23/2021   Muscle weakness (generalized)    Per [facility], [facility] Records [facility]    Need for assistance with personal care    Per [facility], [facility] Records [facility]    OAB (overactive bladder)    Per [facility], [facility]    Ocular hypertension, bilateral    Per [facility], [facility]    Osteoarthritis of right knee    Per [facility], [facility] Records [facility]    Paraplegia [facility])    Pyelonephritis, acute    Per [facility], [facility] Records [facility]    Status post total right knee replacement    Unspecified dementia without behavioral disturbance    Per [facility], [facility] Records [facility]    Urgency of urination    Per [facility], [facility]    Wheelchair dependence    Per Zadie Cleverly, [facility] Records [facility]     Past Surgical History:  Procedure Laterality Date   ABDOMINAL HYSTERECTOMY     CATARACT EXTRACTION     COLONOSCOPY N/A 12/10/2013   Danie Binder, MD ,Gi revealed redundant colon   TOTAL KNEE ARTHROPLASTY Right 03/30/2016   Procedure: TOTAL KNEE ARTHROPLASTY;  Surgeon: Carole Civil, MD;  Location: [facility];  Service: Orthopedics;  Laterality: Right;    Social History   Socioeconomic History   Marital status: Divorced    Spouse name: Not on file   Number of children: Not on file   Years of education: Not on file   Highest education level: Not on file  Occupational History   Occupation: retired   Tobacco Use   Smoking status: Former    Packs/day: 0.25    Years: 3.00    Total pack years: 0.75    Types: Cigarettes   Smokeless tobacco: Never  Vaping Use   Vaping Use: Never used  Substance and  Sexual Activity   Alcohol use: No   Drug use: No   Sexual activity: Not Currently    Birth control/protection: Post-menopausal  Other Topics Concern   Not on file  Social History Narrative   Long term resident of [facility]    Social Determinants of Health   Financial Resource Strain: Not on file  Food Insecurity: Not on file  Transportation Needs: Not on file  Physical Activity: Inactive (02/17/2020)   Exercise Vital Sign    Days of Exercise per Week: 0 days    Minutes of Exercise per Session: 0 min  Stress: Not on file  Social Connections: Not on file  Intimate Partner Violence: Not At Risk (02/17/2020)   Humiliation, Afraid, Rape, and Kick questionnaire    Fear of Current or Ex-Partner: No    Emotionally Abused: No    Physically Abused: No    Sexually Abused: No   Family History  Problem Relation Age of Onset   Cancer Mother    Arthritis Mother    Hypotension Father    Cancer Sister    Hypotension Sister    Colon cancer Neg Hx       VITAL SIGNS BP 108/73   Pulse 78   Temp 97.8 F (36.6 C)   Resp (!) 22   Ht 5\' 5"  (1.651 m)   Wt 211 lb (95.7 kg)   SpO2 97%   BMI 35.11 kg/m   Outpatient Encounter Medications as of 02/17/2022  Medication Sig   acetaminophen (TYLENOL) 325 MG tablet Take 650 mg by mouth 2 (two) times daily.   ascorbic acid (VITAMIN C) 500 MG tablet Take 500 mg by mouth daily.   calcium citrate (CALCITRATE - DOSED IN MG ELEMENTAL CALCIUM) 950 MG tablet Take 200 mg of elemental calcium by mouth daily.   ferrous sulfate 325 (65 FE) MG EC tablet Take 325 mg by mouth 2 (two) times a week. Monday and Thursday   guaiFENesin-dextromethorphan (ROBITUSSIN DM) 100-10 MG/5ML syrup Take 15 mLs by mouth every 6 (six) hours as needed for cough.   latanoprost (XALATAN) 0.005 % ophthalmic solution Place 1 drop into both eyes at bedtime. wait 5 minutes between multiple eye drops   mirabegron ER (MYRBETRIQ) 50 MG TB24 tablet Take 50 mg by mouth daily.   NON FORMULARY  Diet: NAS,   omeprazole (PRILOSEC) 20 MG capsule Take 20 mg by mouth 2 (two) times daily before a meal. Special Instructions: *TAKE ON AN EMPTY STOMACH* FOR GERD *DO NOT CRUSH* (FORMULARY SUB FOR PANTOPRAZOLE 40MG )   polyethylene glycol powder (GLYCOLAX/MIRALAX) 17 GM/SCOOP powder Take by mouth daily as needed.   No facility-administered encounter medications on file as of 02/17/2022.     SIGNIFICANT DIAGNOSTIC EXAMS  PREVIOUS  02-05-20: DEXA: t score: -2.279  02-18-21: ct angio of chest:  1. Negative for acute PE or thoracic aortic dissection. 2.  Aortic Atherosclerosis   02-24-21: bilateral lower extremity venous doppler:  No evidence of deep venous thrombosis in either lower extremity.   02-24-21: KUB Moderate colonic stool burden. No evidence of bowel obstruction.  05-05-21: ct angio of chest:  1. No CT evidence for acute pulmonary embolus. 2. Bilateral lower lobe bronchial wall thickening with scattered areas of peripheral airway impaction. Imaging features suggest chronic infectious/inflammatory etiology. 3. Aortic Atherosclerosis   05-19-21: right lower extremity venous doppler:  1. No evidence of deep vein thrombosis within the visualized veins of the right lower extremity. If there are persistent symptoms, repeat venous Doppler examination may be obtained in 3 to 5 days.  2. Right mid femoral and right distal femoral veins not well visualized due to body habitus and patient could not tolerate compression.  NO NEW EXAMS.    LABS REVIEWED PREVIOUS;     01-24-21: d-dimer: 6.73  01-31-21: d-dimer: 5.30 02-07-21: d-dimer: 6.25 02-14-21: d-dimer: 12.74 02-24-21: d-dimer: >20 03-01-21: d-dimer: 14.18 03-10-21: d-dimer: 11.10 03-17-21; d-dimer: 10.44 03-22-21: d-dimer: 10.32  03-29-21: d-dimer: 10.97 04-11-21: d-dimer 12.12 04-25-21: d-dimer: 7.42   05-09-21: d-dimer: 5.60  05-19-21: d-dimer: 5.63 05-24-21; wbc 6.6; hgb 8.9; hct 30.4; mcv 81.1 plt 453 ;glucose 92; bun 16; creat 0.46; k+  3.8; na++ 137; ca 8.5; GFR >60; liver normal albumin 3.1  05-27-21: guaiac negative 06-03-21: d-dimer: 6.31  06-20-21: wbc 6.3; hgb 9,3; hct 30.8; mcv 79.0 plt 431; tsh 1.144 hgb a1c 6.0  d-dimer 7.14 12/2; 06/29/21: guaiac +  07-04-21 d-dimer: 4.35 08-26-21; wbc 5.8; hgb 9.9; hct 32.9; mcv 77.4 plt 410; d-dimer 13.43; factor 5: 114 (n); factor 8: 233 (n) Fibrinogen 477 (ele) INR 1.4; folate 16.5; vitamin B 12: 314; ferritin 10; iron 18; tibc 361;  11-21-21: d-dimer: >20 01-04-22: wbc 5.4; hgb 11.7; hct 40.9; mcv 80.2 plt 280; glucose 88 bun 14; creat 0.63; k+ 4.8; na++ 134; ca 8.8; gfr>60; protein 7.9; albumin 3.6; iron 44; tibc 308; ferritin 90   NO NEW LABS.   Review of Systems  Constitutional:  Negative for malaise/fatigue.  Respiratory:  Negative for cough and shortness of breath.   Cardiovascular:  Negative for chest pain, palpitations and leg swelling.  Gastrointestinal:  Negative for abdominal pain, constipation and heartburn.  Musculoskeletal:  Negative for back pain, joint pain and myalgias.  Skin: Negative.   Neurological:  Negative for dizziness.  Psychiatric/Behavioral:  The patient is not nervous/anxious.     Physical Exam Constitutional:      General: She is not in acute distress.    Appearance: She is well-developed. She is obese. She is not diaphoretic.  Neck:     Thyroid: No thyromegaly.  Cardiovascular:     Rate and Rhythm: Normal rate and regular rhythm.     Pulses: Normal pulses.     Heart sounds: Normal heart sounds.  Pulmonary:     Effort: Pulmonary effort is normal. No respiratory distress.     Breath sounds: Normal breath sounds.  Abdominal:     General: Bowel sounds are normal. There is no distension.     Palpations: Abdomen is soft.     Tenderness: There is no abdominal tenderness.  Musculoskeletal:     Cervical back: Neck supple.     Right lower leg: No edema.     Left lower leg: No edema.     Comments: Is able to move all extremities History of  right knee replacement with contracture       Lymphadenopathy:  Cervical: No cervical adenopathy.  Skin:    General: Skin is warm and dry.  Neurological:     Mental Status: She is alert. Mental status is at baseline.  Psychiatric:        Mood and Affect: Mood normal.       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  Moderate vascular dementia without behavioral disturbance psychotic disturbance mood disturbance or anxiety 4. Mild protein calorie malnutrition    Will continue current medications Will continue current plan of care Will continue to monitor her status  Time spent with patient: 40 minutes: medications; dietary; plan of care.    Synthia Innocent NP [facility]   call [phone]

## 2022-02-17 NOTE — Progress Notes (Signed)
Patient tolerated iron infusion with no complaints voiced. Peripheral IV site clean and dry with good blood return noted before and after infusion. Band aid applied. Patient refused to wait full wait time. VSS with discharge and left in satisfactory condition with [facility] staff with no s/s of distress noted.

## 2022-02-17 NOTE — Patient Instructions (Signed)
[facility]  Discharge Instructions: Thank you for choosing [facility] to provide your oncology and hematology care.  If you have a lab appointment with the Cancer Center, please come in thru the Main Entrance and check in at the main information desk.  Wear comfortable clothing and clothing appropriate for easy access to any Portacath or PICC line.   We strive to give you quality time with your provider. You may need to reschedule your appointment if you arrive late (15 or more minutes).  Arriving late affects you and other patients whose appointments are after yours.  Also, if you miss three or more appointments without notifying the office, you may be dismissed from the clinic at the provider's discretion.      For prescription refill requests, have your pharmacy contact our office and allow 72 hours for refills to be completed.    Today you received the following Venofer, return as scheduled.   To help prevent nausea and vomiting after your treatment, we encourage you to take your nausea medication as directed.  BELOW ARE SYMPTOMS THAT SHOULD BE REPORTED IMMEDIATELY: *FEVER GREATER THAN 100.4 F (38 C) OR HIGHER *CHILLS OR SWEATING *NAUSEA AND VOMITING THAT IS NOT CONTROLLED WITH YOUR NAUSEA MEDICATION *UNUSUAL SHORTNESS OF BREATH *UNUSUAL BRUISING OR BLEEDING *URINARY PROBLEMS (pain or burning when urinating, or frequent urination) *BOWEL PROBLEMS (unusual diarrhea, constipation, pain near the anus) TENDERNESS IN MOUTH AND THROAT WITH OR WITHOUT PRESENCE OF ULCERS (sore throat, sores in mouth, or a toothache) UNUSUAL RASH, SWELLING OR PAIN  UNUSUAL VAGINAL DISCHARGE OR ITCHING   Items with * indicate a potential emergency and should be followed up as soon as possible or go to the Emergency Department if any problems should occur.  Please show the CHEMOTHERAPY ALERT CARD or IMMUNOTHERAPY ALERT CARD at check-in to the Emergency Department and triage  nurse.  Should you have questions after your visit or need to cancel or reschedule your appointment, please contact [facility] [phone]  and follow the prompts.  Office hours are 8:00 a.m. to 4:30 p.m. Monday - Friday. Please note that voicemails left after 4:00 p.m. may not be returned until the following business day.  We are closed weekends and major holidays. You have access to a nurse at all times for urgent questions. Please call the main number to the clinic [phone] and follow the prompts.  For any non-urgent questions, you may also contact your provider using MyChart. We now offer e-Visits for anyone 18 and older to request care online for non-urgent symptoms. For details visit [email or web address].   Also download the MyChart app! Go to the app store, search "MyChart", open the app, select [facility], and log in with your MyChart username and password.  Masks are optional in the cancer centers. If you would like for your care team to wear a mask while they are taking care of you, please let them know. For doctor visits, patients may have with them one support person who is at least 85 years old. At this time, visitors are not allowed in the infusion area.  

## 2022-02-19 NOTE — Progress Notes (Deleted)
Referring Provider: Sharee Holster, NP Primary Care Physician:  Sharee Holster, NP Primary GI Physician: Dr. Marletta Lor  No chief complaint on file.   HPI:   Anna Hanna is a 87 y.o. female presenting today for follow-up of anemia and constipation.    Last seen in our office in January 2023 at the request of Synthia Innocent at [facility] for heme positive stool. Noted patient had chronic anemia with most recent hemogobin 9.3. Chronically on  Eliquis for hypercoagulable disorder. Recently had heme positive stool, but denied overt GI bleeding. Admitted to constipation but this was well controlled on MiraLAX. No other significant symptoms or alarm symptoms. Last colonoscopy 2015 unremarkable. Discussion was had regarding colonoscopy vs conservative management and patient preferred conservative management. Agreed with referral to hematology which was already  placed by [facility].    She established with hematology in February for elevated D dimer and anemia. Found to have iron deficiency on 08/26/2021 with ferritin of 10, iron 18, saturation 5%.  She has received 6 iron infusions, last on 02/17/2022. Regarding elevated D dimer, she completed additional work-up without a specific explanation and ultimately recommended discontinuing Eliquis at her last visit  on 01/12/22. Most recent Hgb 11.7 on 6/14, iron panel wnl. CT A/P 01/04/22 with no obvious malignancy. Patient requested to continue supportive treatment with iron replacement and declined further work-up of IDA.    Today:     Past Medical History:  Diagnosis Date   Abnormal posture    Per [facility] Electronic Medical Records System    Anemia    Cognitive communication deficit    Per [facility], [facility] Electronic [facility] Records [facility]    Contracture of left knee    Per [facility], [facility] Electronic Medical Records [facility]    Diabetes mellitus without complication (HCC)    Difficulty walking     Per [facility], [facility] Electronic Medical Records [facility]    Extended spectrum beta lactamase (ESBL) resistance    Per [facility], [facility] Electronic Medical Records [facility]    Gastroesophageal reflux disease    Per [facility], [facility] Electronic Medical Records [facility]    Glaucoma    Gram negative sepsis (HCC)    Per [facility], [facility] Electronic Medical Records System    Hypercholesteremia    Hypertension    Hypokalemia    Infection, bacterial    Per [facility], [facility] Electronic Medical Records System    Iron deficiency anemia due to chronic blood loss 11/23/2021   Muscle weakness (generalized)    Per [facility], [facility] Electronic Medical Records System    Need for assistance with personal care    Per [facility], [facility] Electronic Medical Records System    OAB (overactive bladder)    Per [facility], [facility] Records [facility]    Ocular hypertension, bilateral    Per [facility], [facility] Electronic Medical Records [facility]    Osteoarthritis of right knee    Per [facility], [facility] Electronic Medical Records System    Paraplegia [facility])    Pyelonephritis, acute    Per [facility], [facility] Electronic Medical Records System    Status post total right knee replacement    Unspecified dementia without behavioral disturbance    Per [facility], [facility] Electronic Medical Records System    Urgency of urination    Per [facility], [facility] Electronic  Medical Records System    Wheelchair dependence    Per Ria [facility], [facility] Electronic Medical Records System     Past Surgical History:  Procedure Laterality Date   ABDOMINAL HYSTERECTOMY     CATARACT EXTRACTION     COLONOSCOPY N/A 12/10/2013   West Bali, MD ,Gi revealed redundant colon   TOTAL KNEE ARTHROPLASTY Right 03/30/2016   Procedure: TOTAL KNEE ARTHROPLASTY;  Surgeon: Vickki Hearing, MD;  Location: [facility];  Service: Orthopedics;  Laterality: Right;    No current outpatient medications on file.   No current facility-administered medications for this visit.    Allergies as of 02/20/2022   (No Known Allergies)    Family History  Problem Relation Age of Onset   Cancer Mother    Arthritis Mother    Hypotension Father    Cancer Sister    Hypotension Sister    Colon cancer Neg Hx     Social History   Socioeconomic History   Marital status: Divorced    Spouse name: Not on file   Number of children: Not on file   Years of education: Not on file   Highest education level: Not on file  Occupational History   Occupation: retired   Tobacco Use   Smoking status: Former    Packs/day: 0.25    Years: 3.00    Total pack years: 0.75    Types: Cigarettes   Smokeless tobacco: Never  Vaping Use   Vaping Use: Never used  Substance and Sexual Activity   Alcohol use: No   Drug use: No   Sexual activity: Not Currently    Birth control/protection: Post-menopausal  Other Topics Concern   Not on file  Social History Narrative   Long term resident of [facility]    Social Determinants of Health   Financial Resource Strain: Not on file  Food Insecurity: Not on file  Transportation Needs: Not on file  Physical Activity: Inactive (02/17/2020)   Exercise Vital Sign    Days of Exercise per Week: 0 days    Minutes of Exercise per Session: 0 min  Stress: Not on file  Social Connections: Not on file    Review of Systems: Gen: Denies fever, chills, cold or flulike symptoms, presyncope, syncope. CV: Denies chest pain, palpitations. Resp: Denies dyspnea, cough. GI: See HPI Heme: See HPI  Physical Exam: There were no vitals taken for this visit. General:   Alert and oriented. No distress noted. Pleasant and cooperative.  Head:  Normocephalic and atraumatic. Eyes:  Conjuctiva clear without scleral icterus. Heart:  S1, S2 present without murmurs  appreciated. Lungs:  Clear to auscultation bilaterally. No wheezes, rales, or rhonchi. No distress.  Abdomen:  +BS, soft, non-tender and non-distended. No rebound or guarding. No HSM or masses noted. Msk:  Symmetrical without gross deformities. Normal posture. Extremities:  Without edema. Neurologic:  Alert and  oriented x4 Psych:  Normal mood and affect.    Assessment:  87 year old female with history of diabetes, GERD, glaucoma, HTN, HLD, chronic anemia, previously on Eliquis for hypercoagulable state, presenting today for follow-up of IDA.    IDA:  Anemia dates back to February 2021.  Hemoglobin has been in the 9-10 range for the last year.  Noted low iron and saturation but normal ferritin in 2017.  More recently February 2023, she was also found to have a low ferritin of 10 in addition to low iron and saturation.  She established with hematology and has received 6  iron infusions and most recent labs on 6/14 revealed hemoglobin improved to 11.7 and iron panel within normal limits.  She also completed additional work-up for elevated D-dimer with no significant findings and Eliquis was discontinued on 6/22.  She completed CT A/P on 01/04/2022 with no obvious malignancy. Last colonoscopy in 2015 with normal exam. No prior EGD.   Plan:  ***   Ermalinda Memos, PA-C [facility] 02/20/2022

## 2022-02-20 ENCOUNTER — Ambulatory Visit: Payer: Medicare HMO | Admitting: Gastroenterology

## 2022-03-06 ENCOUNTER — Non-Acute Institutional Stay (SKILLED_NURSING_FACILITY): Payer: Medicare HMO | Admitting: Adult Health

## 2022-03-06 ENCOUNTER — Encounter: Payer: Self-pay | Admitting: Adult Health

## 2022-03-06 DIAGNOSIS — K219 Gastro-esophageal reflux disease without esophagitis: Secondary | ICD-10-CM

## 2022-03-06 DIAGNOSIS — D649 Anemia, unspecified: Secondary | ICD-10-CM

## 2022-03-06 DIAGNOSIS — I10 Essential (primary) hypertension: Secondary | ICD-10-CM

## 2022-03-06 NOTE — Progress Notes (Signed)
Location:  [facility] Room Number: NO/105/D Place of Service:  SNF (31) Anna Hanna S.,NP  CODE STATUS: DNR  No Known Allergies  Chief Complaint  Patient presents with   Medical Management of Chronic Issues                        Essential hypertension:  Chronic anemia:  GERD without esophagitis     HPI:  She is a 87 year old long term resident of this facility being seen for the management of her chronic illnesses: Essential hypertension:  Chronic anemia:  GERD without esophagitis. There are no reports of uncontrolled pain. She does continue to attend activities. There are no reports of anxiety of depressive thoughts.   Past Medical History:  Diagnosis Date   Abnormal posture    Per [facility] Electronic Medical Records System    Anemia    Cognitive communication deficit    Per [facility], [facility] Electronic Medical Records [facility]    Contracture of left knee    Per [facility] Electronic Medical Records [facility]    Diabetes mellitus without complication (HCC)    Difficulty walking    Per [facility], [facility] Electronic Medical Records System    Extended spectrum beta lactamase (ESBL) resistance    Per [facility] Electronic Medical Records System    Gastroesophageal reflux disease    Per [facility] Electronic Medical Records System    Glaucoma    Gram negative sepsis (HCC)    Per [facility], [facility] Electronic Medical Records System    Hypercholesteremia    Hypertension    Hypokalemia    Infection, bacterial    Per [facility] Electronic Medical Records System    Iron deficiency anemia due to chronic blood loss 11/23/2021   Muscle weakness (generalized)    Per [facility], [facility] Electronic Medical Records System    Need for assistance with personal care    Per [facility] Electronic Medical Records  System    OAB (overactive bladder)    Per [facility] Electronic Medical Records System    Ocular hypertension, bilateral    Per [facility] Electronic Medical Records System    Osteoarthritis of right knee    Per [facility] Electronic Medical Records System    Paraplegia [facility])    Pyelonephritis, acute    Per [facility], [facility] Electronic Medical Records System    Status post total right knee replacement    Unspecified dementia without behavioral disturbance    Per [facility], [facility] Electronic Medical Records System    Urgency of urination    Per [facility] Electronic Medical Records System    Wheelchair dependence    Per Ria Bush, [facility] Electronic Medical Records System     Past Surgical History:  Procedure Laterality Date   ABDOMINAL HYSTERECTOMY     CATARACT EXTRACTION     COLONOSCOPY N/A 12/10/2013   West Bali, MD ,Gi revealed redundant colon   TOTAL KNEE ARTHROPLASTY Right 03/30/2016   Procedure: TOTAL KNEE ARTHROPLASTY;  Surgeon: Vickki Hearing, MD;  Location: [facility];  Service: Orthopedics;  Laterality: Right;    Social History   Socioeconomic History   Marital status: Divorced    Spouse name: Not on file   Number of children: Not on file  Years of education: Not on file   Highest education level: Not on file  Occupational History   Occupation: retired   Tobacco Use   Smoking status: Former    Packs/day: 0.25    Years: 3.00    Total pack years: 0.75    Types: Cigarettes   Smokeless tobacco: Never  Vaping Use   Vaping Use: Never used  Substance and Sexual Activity   Alcohol use: No   Drug use: No   Sexual activity: Not Currently    Birth control/protection: Post-menopausal  Other Topics Concern   Not on file  Social History Narrative   Long term resident of [facility]    Social Determinants of Health   Financial Resource Strain: Not on file   Food Insecurity: Not on file  Transportation Needs: Not on file  Physical Activity: Inactive (02/17/2020)   Exercise Vital Sign    Days of Exercise per Week: 0 days    Minutes of Exercise per Session: 0 min  Stress: Not on file  Social Connections: Not on file  Intimate Partner Violence: Not At Risk (02/17/2020)   Humiliation, Afraid, Rape, and Kick questionnaire    Fear of Current or Ex-Partner: No    Emotionally Abused: No    Physically Abused: No    Sexually Abused: No   Family History  Problem Relation Age of Onset   Cancer Mother    Arthritis Mother    Hypotension Father    Cancer Sister    Hypotension Sister    Colon cancer Neg Hx       VITAL SIGNS BP (!) 142/64   Pulse 94   Temp 98 F (36.7 C)   Resp 18   Ht 5\' 5"  (1.651 m)   Wt 211 lb (95.7 kg)   SpO2 95%   BMI 35.11 kg/m   Outpatient Encounter Medications as of 03/06/2022  Medication Sig   acetaminophen (TYLENOL) 325 MG tablet Take 650 mg by mouth 2 (two) times daily.   ascorbic acid (VITAMIN C) 500 MG tablet Take 500 mg by mouth daily.   calcium citrate (CALCITRATE - DOSED IN MG ELEMENTAL CALCIUM) 950 MG tablet Take 200 mg of elemental calcium by mouth daily.   ferrous sulfate 325 (65 FE) MG EC tablet Take 325 mg by mouth 2 (two) times a week. Monday and Thursday   guaiFENesin-dextromethorphan (ROBITUSSIN DM) 100-10 MG/5ML syrup Take 15 mLs by mouth every 6 (six) hours as needed for cough.   latanoprost (XALATAN) 0.005 % ophthalmic solution Place 1 drop into both eyes at bedtime. wait 5 minutes between multiple eye drops   mirabegron ER (MYRBETRIQ) 50 MG TB24 tablet Take 50 mg by mouth daily.   NON FORMULARY Diet: NAS,   omeprazole (PRILOSEC) 20 MG capsule Take 20 mg by mouth 2 (two) times daily before a meal. Special Instructions: *TAKE ON AN EMPTY STOMACH* FOR GERD *DO NOT CRUSH* (FORMULARY SUB FOR PANTOPRAZOLE 40MG )   polyethylene glycol powder (GLYCOLAX/MIRALAX) 17 GM/SCOOP powder Take by mouth daily as  needed.   No facility-administered encounter medications on file as of 03/06/2022.     SIGNIFICANT DIAGNOSTIC EXAMS   PREVIOUS  02-05-20: DEXA: t score: -2.279  02-24-21: bilateral lower extremity venous doppler:  No evidence of deep venous thrombosis in either lower extremity.   02-24-21: KUB Moderate colonic stool burden. No evidence of bowel obstruction.  05-05-21: ct angio of chest:  1. No CT evidence for acute pulmonary embolus. 2. Bilateral lower lobe bronchial wall thickening  with scattered areas of peripheral airway impaction. Imaging features suggest chronic infectious/inflammatory etiology. 3. Aortic Atherosclerosis   05-19-21: right lower extremity venous doppler:  1. No evidence of deep vein thrombosis within the visualized veins of the right lower extremity. If there are persistent symptoms, repeat venous Doppler examination may be obtained in 3 to 5 days.  2. Right mid femoral and right distal femoral veins not well visualized due to body habitus and patient could not tolerate compression.  NO NEW EXAMS.    LABS REVIEWED PREVIOUS;     02-24-21: d-dimer: >20 03-01-21: d-dimer: 14.18 03-10-21: d-dimer: 11.10 03-17-21; d-dimer: 10.44 03-22-21: d-dimer: 10.32  03-29-21: d-dimer: 10.97 04-11-21: d-dimer 12.12 04-25-21: d-dimer: 7.42   05-09-21: d-dimer: 5.60  05-19-21: d-dimer: 5.63 05-24-21; wbc 6.6; hgb 8.9; hct 30.4; mcv 81.1 plt 453 ;glucose 92; bun 16; creat 0.46; k+ 3.8; na++ 137; ca 8.5; GFR >60; liver normal albumin 3.1  05-27-21: guaiac negative 06-03-21: d-dimer: 6.31  06-20-21: wbc 6.3; hgb 9,3; hct 30.8; mcv 79.0 plt 431; tsh 1.144 hgb a1c 6.0  d-dimer 7.14 12/2; 06/29/21: guaiac +  07-04-21 d-dimer: 4.35 08-26-21; wbc 5.8; hgb 9.9; hct 32.9; mcv 77.4 plt 410; d-dimer 13.43; factor 5: 114 (n); factor 8: 233 (n) Fibrinogen 477 (ele) INR 1.4; folate 16.5; vitamin B 12: 314; ferritin 10; iron 18; tibc 361;  11-21-21: d-dimer: >20 01-04-22: wbc 5.4; hgb 11.7; hct 40.9; mcv  80.2 plt 280; glucose 88 bun 14; creat 0.63; k+ 4.8; na++ 134; ca 8.8; gfr>60; protein 7.9; albumin 3.6; iron 44; tibc 308; ferritin 90   01-12-22: hgb a1c 5.3; urine micro-albumin 4.8    Review of Systems  Constitutional:  Negative for malaise/fatigue.  Respiratory:  Negative for cough and shortness of breath.   Cardiovascular:  Negative for chest pain, palpitations and leg swelling.  Gastrointestinal:  Negative for abdominal pain, constipation and heartburn.  Musculoskeletal:  Negative for back pain, joint pain and myalgias.  Skin: Negative.   Neurological:  Negative for dizziness.  Psychiatric/Behavioral:  The patient is not nervous/anxious.    Physical Exam Constitutional:      General: She is not in acute distress.    Appearance: She is well-developed. She is obese. She is not diaphoretic.  Neck:     Thyroid: No thyromegaly.  Cardiovascular:     Rate and Rhythm: Normal rate and regular rhythm.     Pulses: Normal pulses.     Heart sounds: Normal heart sounds.  Pulmonary:     Effort: Pulmonary effort is normal. No respiratory distress.     Breath sounds: Normal breath sounds.  Abdominal:     General: Bowel sounds are normal. There is no distension.     Palpations: Abdomen is soft.     Tenderness: There is no abdominal tenderness.  Musculoskeletal:     Cervical back: Neck supple.     Right lower leg: No edema.     Left lower leg: No edema.     Comments:  Is able to move all extremities History of right knee replacement with contracture      Lymphadenopathy:     Cervical: No cervical adenopathy.  Skin:    General: Skin is warm and dry.  Neurological:     Mental Status: She is alert. Mental status is at baseline.  Psychiatric:        Mood and Affect: Mood normal.       ASSESSMENT/ PLAN:  TODAY;   Essential hypertension: b/p 142/64 will continue asa  81 mg daily   2. Chronic anemia: hgb 11.7 will continue iron twice weekly   3. GERD without esophagitis: will  continue prilosec 20 mg twice daily    PREVIOUS   4. Urinary urgency: will continue myrbetriq 50 mg daily    5. Vascular dementia without behavioral disturbance: weight is 211 pounds will monitor  6. Aortic atherosclerosis: (ct 09-03-19) no statin due to advanced age; is on asa   7. Hypokalemia: k+ 4.8; will monitor  8. Increased ocular pressure bilateral: will continue xalatan to both eyes  9. Primary osteoarthritis of right knee; left index finger: will continue tylenol 650 mg twice daily   10. Unspecified protein calorie malnutrition: albumin 3.6 protein 7.9    Anna Innocent NP [facility]  call [phone]

## 2022-03-10 ENCOUNTER — Encounter: Payer: Self-pay | Admitting: Adult Health

## 2022-03-10 DIAGNOSIS — M8580 Other specified disorders of bone density and structure, unspecified site: Secondary | ICD-10-CM | POA: Insufficient documentation

## 2022-03-15 ENCOUNTER — Non-Acute Institutional Stay (SKILLED_NURSING_FACILITY): Payer: Medicare HMO | Admitting: Adult Health

## 2022-03-15 ENCOUNTER — Encounter: Payer: Self-pay | Admitting: Adult Health

## 2022-03-15 DIAGNOSIS — Z0000 Encounter for general adult medical examination without abnormal findings: Secondary | ICD-10-CM | POA: Diagnosis not present

## 2022-03-15 NOTE — Patient Instructions (Signed)
  Ms. Farnell , Thank you for taking time to come for your Medicare Wellness Visit. I appreciate your ongoing commitment to your health goals. Please review the following plan we discussed and let me know if I can assist you in the future.   These are the goals we discussed:  Goals      DIET - INCREASE WATER INTAKE     Follow up with Provider as scheduled     General - Client will not be readmitted within 30 days (C-SNP)        This is a list of the screening recommended for you and due dates:  Health Maintenance  Topic Date Due   COVID-19 Vaccine (5 - Moderna series) 09/17/2021   Eye exam for diabetics  09/22/2021   Complete foot exam   01/10/2022   Flu Shot  02/21/2022   Hemoglobin A1C  07/14/2022   Urine Protein Check  01/13/2023   Tetanus Vaccine  05/26/2031   Pneumonia Vaccine  Completed   DEXA scan (bone density measurement)  Completed   HPV Vaccine  Aged Out   Zoster (Shingles) Vaccine  Discontinued

## 2022-03-15 NOTE — Progress Notes (Signed)
Subjective:   Anna Hanna is a 87 y.o. female who presents for Medicare Annual (Subsequent) preventive examination.  Review of Systems    Review of Systems  Constitutional:  Negative for malaise/fatigue.  Respiratory:  Negative for cough and shortness of breath.   Cardiovascular:  Negative for chest pain, palpitations and leg swelling.  Gastrointestinal:  Negative for abdominal pain, constipation and heartburn.  Musculoskeletal:  Negative for back pain, joint pain and myalgias.  Skin: Negative.   Neurological:  Negative for dizziness.  Psychiatric/Behavioral:  The patient is not nervous/anxious.     Cardiac Risk Factors include: advanced age (>43men, >14 women);sedentary lifestyle;obesity (BMI >30kg/m2)     Objective:    Today's Vitals   03/15/22 1352  BP: 134/85  Pulse: 72  Resp: 20  Temp: 97.9 F (36.6 C)  SpO2: 95%  Weight: 211 lb (95.7 kg)  Height: 5\' 5"  (1.651 m)   Body mass index is 35.11 kg/m.     03/06/2022    8:37 AM 02/17/2022   11:35 AM 02/17/2022   10:59 AM 02/14/2022    3:07 PM 02/10/2022    9:14 AM 01/20/2022    9:47 AM 01/12/2022    9:14 AM  Advanced Directives  Does Patient Have a Medical Advance Directive? No No No No Yes Yes Yes  Type of 01/14/2022;Living will Healthcare Power of [place];Living will   Healthcare Power of [place];Living will Healthcare Power of [place];Living will Healthcare Power of [place];Living will  Does patient want to make changes to medical advance directive? No - Patient declined No - Patient declined  No - Patient declined  No - Patient declined No - Patient declined  Copy of Healthcare Power of Attorney in Chart? No - copy requested No - copy requested   No - copy requested No - copy requested No - copy requested  Would patient like information on creating a medical advance directive? No - Patient declined No - Patient declined   No - Patient declined No - Patient declined No - Patient  declined    Current Medications (verified) Outpatient Encounter Medications as of 03/15/2022  Medication Sig   acetaminophen (TYLENOL) 325 MG tablet Take 650 mg by mouth 2 (two) times daily.   ascorbic acid (VITAMIN C) 500 MG tablet Take 500 mg by mouth daily.   calcium citrate (CALCITRATE - DOSED IN MG ELEMENTAL CALCIUM) 950 MG tablet Take 200 mg of elemental calcium by mouth daily.   ferrous sulfate 325 (65 FE) MG EC tablet Take 325 mg by mouth 2 (two) times a week. Monday and Thursday   guaiFENesin-dextromethorphan (ROBITUSSIN DM) 100-10 MG/5ML syrup Take 15 mLs by mouth every 6 (six) hours as needed for cough.   latanoprost (XALATAN) 0.005 % ophthalmic solution Place 1 drop into both eyes at bedtime. wait 5 minutes between multiple eye drops   mirabegron ER (MYRBETRIQ) 50 MG TB24 tablet Take 50 mg by mouth daily.   NON FORMULARY Diet: NAS,   omeprazole (PRILOSEC) 20 MG capsule Take 20 mg by mouth 2 (two) times daily before a meal. Special Instructions: *TAKE ON AN EMPTY STOMACH* FOR GERD *DO NOT CRUSH* (FORMULARY SUB FOR PANTOPRAZOLE 40MG )   polyethylene glycol powder (GLYCOLAX/MIRALAX) 17 GM/SCOOP powder Take by mouth daily as needed.   No facility-administered encounter medications on file as of 03/15/2022.    Allergies (verified) Patient has no known allergies.   History: Past Medical History:  Diagnosis Date   Abnormal posture  Per Ria Bush, [facility] Electronic [facility] Records System    Anemia    Cognitive communication deficit    Per [facility], [facility] Electronic [facility] Records [facility]    Contracture of left knee    Per [facility], [facility] Records [facility]    Diabetes mellitus without complication (HCC)    Difficulty walking    Per [facility], [facility] Records [facility]    Extended spectrum beta lactamase (ESBL) resistance    Per [facility], [facility] Records [facility]     Gastroesophageal reflux disease    Per [facility], [facility] Records [facility]    Glaucoma    Gram negative sepsis (HCC)    Per [facility], [facility] Records [facility]    Hypercholesteremia    Hypertension    Hypokalemia    Infection, bacterial    Per [facility], [facility] Records [facility]    Iron deficiency anemia due to chronic blood loss 11/23/2021   Muscle weakness (generalized)    Per [facility], [facility] Records [facility]    Need for assistance with personal care    Per [facility], [facility] Records [facility]    OAB (overactive bladder)    Per [facility], [facility] Records [facility]    Ocular hypertension, bilateral    Per [facility], [facility] Records [facility]    Osteoarthritis of right knee    Per [facility], [facility] Records [facility]    Paraplegia [facility])    Pyelonephritis, acute    Per [facility], [facility] Electronic [facility] Records [facility]    Status post total right knee replacement    Unspecified dementia without behavioral disturbance    Per [facility], [facility] Records [facility]    Urgency of urination    Per [facility], [facility] Records [facility]    Wheelchair dependence    Per Ria Bush, [facility] Electronic [facility] Records [facility]    Past Surgical History:  Procedure Laterality Date   ABDOMINAL HYSTERECTOMY     CATARACT EXTRACTION     COLONOSCOPY N/A 12/10/2013   West Bali, MD ,Gi revealed redundant colon   TOTAL KNEE ARTHROPLASTY Right 03/30/2016   Procedure: TOTAL KNEE ARTHROPLASTY;  Surgeon: Vickki Hearing, MD;  Location: [facility];  Service: Orthopedics;  Laterality: Right;   Family History  Problem Relation Age of Onset   Cancer Mother    Arthritis Mother    Hypotension Father    Cancer  Sister    Hypotension Sister    Colon cancer Neg Hx    Social History   Socioeconomic History   Marital status: Divorced    Spouse name: Not on file   Number of children: Not on file   Years of education: Not on file   Highest education level: Not on file  Occupational History   Occupation: retired   Tobacco Use   Smoking status: Former    Packs/day: 0.25    Years: 3.00    Total pack years: 0.75    Types: Cigarettes   Smokeless tobacco: Never  Vaping Use   Vaping Use: Never used  Substance and Sexual Activity   Alcohol use: No   Drug use: No   Sexual activity: Not Currently    Birth control/protection: Post-menopausal  Other Topics Concern   Not on file  Social History Narrative   Long term resident of [facility]    Social Determinants of Health   Financial Resource Strain: Not on file  Food Insecurity: Not on file  Transportation Needs: Not on file  Physical Activity: Inactive (02/17/2020)   Exercise Vital Sign    Days of Exercise per Week: 0 days    Minutes of Exercise per Session: 0 min  Stress: Not on file  Social Connections: Not on file    Tobacco Counseling Counseling given: Not Answered   Clinical Intake:  Pre-visit preparation completed: Yes  Pain : No/denies pain     BMI - recorded: 35.11 Nutritional Status: BMI > 30  Obese Nutritional Risks: Unintentional weight loss, Unintentional weight gain Diabetes: No  How often do you need to have someone help you when you read instructions, pamphlets, or other written materials from your doctor or pharmacy?: 5 - Always  Diabetic?no  Interpreter Needed?: No      Activities of Daily Living    03/15/2022    1:56 PM  In your present state of health, do you have any difficulty performing the following activities:  Hearing? 0  Vision? 0  Difficulty concentrating or making decisions? 1  Walking or climbing stairs? 1  Dressing or bathing? 1  Doing errands, shopping? 1  Preparing Food and eating ? Y   Using the Toilet? Y  In the past six months, have you accidently leaked urine? Y  Do you have problems with loss of bowel control? Y  Managing your Medications? Y  Managing your Finances? Y  Housekeeping or managing your Housekeeping? Y    Patient Care Team: Sharee Holster, NP as PCP - [facility] ([facility]iatric [facility]) [facility] Lanelle Bal, DO as Consulting Physician (Gastroenterology) Doreatha Massed, MD as Medical Oncologist (Hematology)  Indicate any recent Medical Services you may have received from other than [facility] providers in the past year (date may be approximate).     Assessment:   This is a routine wellness examination for Vy.  Hearing/Vision screen No results found.  Dietary issues and exercise activities discussed: Current Exercise Habits: The patient does not participate in regular exercise at present, Exercise limited by: None identified   Goals Addressed             This Visit's Progress    DIET - INCREASE WATER INTAKE   On track    Follow up with Provider as scheduled   On track    General - Client will not be readmitted within 30 days ([facility])   On track      Depression Screen    03/15/2022    1:56 PM 08/26/2021   11:48 AM 08/04/2021    1:45 PM 07/22/2021    1:30 PM 07/07/2021    1:10 PM 06/21/2021   11:32 AM 03/14/2021    3:25 PM  PHQ 2/9 Scores  PHQ - 2 Score 0 0 0 0 0 0 0  PHQ- 9 Score 0 0   0      Fall Risk    03/15/2022    1:56 PM 03/06/2022    8:37 AM 12/13/2021   11:51 AM 08/26/2021   11:49 AM 08/04/2021    1:44 PM  Fall Risk   Falls in the past year? 0  0 0 0  Number falls in past yr: 0  0 0 0  Injury with Fall? 0  0 0 0  Risk for fall due to : Impaired  balance/gait;Impaired mobility History of fall(s);Impaired balance/gait Impaired balance/gait;Impaired mobility Impaired balance/gait;Impaired mobility Impaired balance/gait  Follow up  Falls evaluation completed       FALL RISK  PREVENTION PERTAINING TO THE HOME:  Any stairs in or around the home? Yes  If so, are there any without handrails? No  Home free of loose throw rugs in walkways, pet beds, electrical cords, etc? Yes  Adequate lighting in your home to reduce risk of falls? Yes   ASSISTIVE DEVICES UTILIZED TO PREVENT FALLS:  Life alert? No  Use of a cane, walker or w/c? Yes  Grab bars in the bathroom? Yes  Shower chair or bench in shower? Yes  Elevated toilet seat or a handicapped toilet? Yes   TIMED UP AND GO:  Was the test performed? No .   Nonambulatory   Cognitive Function:    03/15/2022    1:57 PM 03/14/2021    3:26 PM  MMSE - Mini Mental State Exam  Orientation to time 3 4  Orientation to Place 3 3  Registration 3 3  Attention/ Calculation 2 2  Recall 1 1  Language- name 2 objects 2 2  Language- repeat 1 1  Language- follow 3 step command 3 3  Language- read & follow direction 1 1  Write a sentence 0 0  Copy design 0 0  Total score 19 20        03/15/2022    1:58 PM 03/14/2021    3:27 PM 02/17/2020   10:48 AM  6CIT Screen  What Year? 4 points 4 points 4 points  What month? 3 points 3 points   What time? 3 points 3 points   Count back from 20 4 points 4 points   Months in reverse 4 points 4 points   Repeat phrase 4 points 6 points   Total Score 22 points 24 points     Immunizations Immunization History  Administered Date(s) Administered   Fluad Quad(high Dose 65+) 04/27/2021   Influenza-Unspecified 04/30/2020   Moderna Covid-19 Vaccine Bivalent Booster 4424yrs & up 05/17/2021   Moderna SARS-COV2 Booster Vaccination 11/03/2020   Moderna Sars-Covid-2 Vaccination 08/03/2019, 08/31/2019, 05/27/2020   PNEUMOCOCCAL CONJUGATE-20 04/29/2021   PPD Test 10/03/2016   Pneumococcal Conjugate-13 04/20/2016   Tdap 05/25/2021    TDAP status: Up to date  Flu Vaccine status: Up to date  Pneumococcal vaccine status: Up to date  Covid-19 vaccine status: Completed  vaccines  Qualifies for Shingles Vaccine? Yes   Zostavax completed Yes   Shingrix Completed?: Yes  Screening Tests Health Maintenance  Topic Date Due   COVID-19 Vaccine (5 - Moderna series) 09/17/2021   OPHTHALMOLOGY EXAM  09/22/2021   FOOT EXAM  01/10/2022   INFLUENZA VACCINE  02/21/2022   HEMOGLOBIN A1C  07/14/2022   URINE MICROALBUMIN  01/13/2023   TETANUS/TDAP  05/26/2031   Pneumonia Vaccine 6365+ Years old  Completed   DEXA SCAN  Completed   HPV VACCINES  Aged Out   Zoster Vaccines- Shingrix  Discontinued    Health Maintenance  Health Maintenance Due  Topic Date Due   COVID-19 Vaccine (5 - Moderna series) 09/17/2021   OPHTHALMOLOGY EXAM  09/22/2021   FOOT EXAM  01/10/2022   INFLUENZA VACCINE  02/21/2022    Colorectal cancer screening: No longer required.   Mammogram status: No longer required due to age.  Bone Density status: Completed 03-08-22. Results reflect: Bone density results: OSTEOPENIA. Repeat every 2 years.  Lung Cancer Screening: (Low Dose CT Chest recommended  if Age 21-80 years, 30 pack-year currently smoking OR have quit w/in 15years.) does not qualify.   Lung Cancer Screening Referral: n/a  Additional Screening:  Hepatitis C Screening: does not qualify; Completed   Vision Screening: Recommended annual ophthalmology exams for early detection of glaucoma and other disorders of the eye. Is the patient up to date with their annual eye exam?  Yes  Who is the provider or what is the name of the office in which the patient attends annual eye exams?  If pt is not established with a provider, would they like to be referred to a provider to establish care?  N/a .   Dental Screening: Recommended annual dental exams for proper oral hygiene  Community Resource Referral / Chronic Care Management: CRR required this visit?  No   CCM required this visit?  No      Plan:     I have personally reviewed and noted the following in the patient's chart:    Medical and social history Use of alcohol, tobacco or illicit drugs  Current medications and supplements including opioid prescriptions. Patient is not currently taking opioid prescriptions. Functional ability and status Nutritional status Physical activity Advanced directives List of other physicians Hospitalizations, surgeries, and ER visits in previous 12 months Vitals Screenings to include cognitive, depression, and falls Referrals and appointments  In addition, I have reviewed and discussed with patient certain preventive protocols, quality metrics, and best practice recommendations. A written personalized care plan for preventive services as well as general preventive health recommendations were provided to patient.     Sharee Holster, NP   03/15/2022   Nurse Notes: this exam was performed by me at this facility

## 2022-04-04 ENCOUNTER — Non-Acute Institutional Stay (SKILLED_NURSING_FACILITY): Payer: Medicare HMO | Admitting: Adult Health

## 2022-04-04 ENCOUNTER — Encounter: Payer: Self-pay | Admitting: Adult Health

## 2022-04-04 DIAGNOSIS — F01B0 Vascular dementia, moderate, without behavioral disturbance, psychotic disturbance, mood disturbance, and anxiety: Secondary | ICD-10-CM | POA: Diagnosis not present

## 2022-04-04 DIAGNOSIS — I700 Atherosclerosis of aorta: Secondary | ICD-10-CM

## 2022-04-04 DIAGNOSIS — N3281 Overactive bladder: Secondary | ICD-10-CM

## 2022-04-04 NOTE — Progress Notes (Unsigned)
Location:  [facility] Room Number: 105-D Place of Service:  SNF (31) Provider: Synthia Innocent, NP  CODE STATUS: FULL CODE  No Known Allergies  Chief Complaint  Patient presents with   Medical Management of Chronic Issues    Routine Visit.   Health Maintenance    Discuss the need for Eye exam, and Foot exam.   Immunizations    Discuss the need for Influenza vaccine.    HPI:    Past Medical History:  Diagnosis Date   Abnormal posture    Per [facility], [facility] Electronic Medical Records System    Anemia    Cognitive communication deficit    Per [facility], [facility] Electronic [facility] Records [facility]    Contracture of left knee    Per [facility], [facility] Electronic Medical Records [facility]    Diabetes mellitus without complication (HCC)    Difficulty walking    Per [facility], [facility] Electronic Medical Records [facility]    Extended spectrum beta lactamase (ESBL) resistance    Per [facility], [facility] Electronic Medical Records [facility]    Gastroesophageal reflux disease    Per [facility], [facility] Electronic Medical Records [facility]    Glaucoma    Gram negative sepsis (HCC)    Per [facility], [facility] Electronic Medical Records System    Hypercholesteremia    Hypertension    Hypokalemia    Infection, bacterial    Per [facility], [facility] Electronic Medical Records System    Iron deficiency anemia due to chronic blood loss 11/23/2021   Muscle weakness (generalized)    Per [facility], [facility] Electronic Medical Records System    Need for assistance with personal care    Per [facility], [facility] Medical Records [facility]    OAB (overactive bladder)    Per [facility], [facility] Records [facility]    Ocular hypertension, bilateral    Per [facility], [facility] Electronic Medical Records System    Osteoarthritis of right knee     Per [facility], [facility] Electronic Medical Records [facility]    Paraplegia [facility])    Pyelonephritis, acute    Per [facility], [facility] Electronic Medical Records System    Status post total right knee replacement    Unspecified dementia without behavioral disturbance    Per [facility], [facility] Electronic Medical Records System    Urgency of urination    Per [facility], [facility] Records [facility]    Wheelchair dependence    Per Ria Bush, [facility] Electronic Medical Records System     Past Surgical History:  Procedure Laterality Date   ABDOMINAL HYSTERECTOMY     CATARACT EXTRACTION     COLONOSCOPY N/A 12/10/2013   [place] Bali, MD ,Gi revealed redundant colon   TOTAL KNEE ARTHROPLASTY Right 03/30/2016   Procedure: TOTAL KNEE ARTHROPLASTY;  Surgeon: Vickki Hearing, MD;  Location: [facility];  Service: Orthopedics;  Laterality: Right;    Social History   Socioeconomic History   Marital status: Divorced    Spouse name: Not on file   Number of children: Not on file   Years of education: Not on file   Highest education level: Not on file  Occupational History   Occupation: retired   Tobacco Use   Smoking status: Former    Packs/day: 0.25    Years: 3.00    Total pack years: 0.75  Types: Cigarettes   Smokeless tobacco: Never  Vaping Use   Vaping Use: Never used  Substance and Sexual Activity   Alcohol use: No   Drug use: No   Sexual activity: Not Currently    Birth control/protection: Post-menopausal  Other Topics Concern   Not on file  Social History Narrative   Long term resident of [facility]    Social Determinants of Health   Financial Resource Strain: Not on file  Food Insecurity: Not on file  Transportation Needs: Not on file  Physical Activity: Inactive (02/17/2020)   Exercise Vital Sign    Days of Exercise per Week: 0 days    Minutes of Exercise per Session: 0 min  Stress: Not on file  Social  Connections: Not on file  Intimate Partner Violence: Not At Risk (02/17/2020)   Humiliation, Afraid, Rape, and Kick questionnaire    Fear of Current or Ex-Partner: No    Emotionally Abused: No    Physically Abused: No    Sexually Abused: No   Family History  Problem Relation Age of Onset   Cancer Mother    Arthritis Mother    Hypotension Father    Cancer Sister    Hypotension Sister    Colon cancer Neg Hx       VITAL SIGNS BP 124/60   Pulse 90   Temp 97.9 F (36.6 C)   Resp 20   Ht 5\' 5"  (1.651 m)   Wt 215 lb (97.5 kg)   SpO2 94%   BMI 35.78 kg/m   Outpatient Encounter Medications as of 04/04/2022  Medication Sig   acetaminophen (TYLENOL) 325 MG tablet Take 650 mg by mouth 2 (two) times daily.   ascorbic acid (VITAMIN C) 500 MG tablet Take 500 mg by mouth daily.   Calcium Carbonate (CALCIUM 600 PO) Take 1,500 mg by mouth daily.   ferrous sulfate 325 (65 FE) MG EC tablet Take 325 mg by mouth 2 (two) times a week. Monday and Thursday   guaiFENesin-dextromethorphan (ROBITUSSIN DM) 100-10 MG/5ML syrup Take 15 mLs by mouth every 6 (six) hours as needed for cough.   latanoprost (XALATAN) 0.005 % ophthalmic solution Place 1 drop into both eyes at bedtime. wait 5 minutes between multiple eye drops   mirabegron ER (MYRBETRIQ) 50 MG TB24 tablet Take 50 mg by mouth daily.   NON FORMULARY Diet: NAS,   omeprazole (PRILOSEC) 20 MG capsule Take 20 mg by mouth 2 (two) times daily before a meal. Special Instructions: *TAKE ON AN EMPTY STOMACH* FOR GERD *DO NOT CRUSH* (FORMULARY SUB FOR PANTOPRAZOLE 40MG )   polyethylene glycol powder (GLYCOLAX/MIRALAX) 17 GM/SCOOP powder Take by mouth daily as needed.   vitamin D3 (CHOLECALCIFEROL) 25 MCG tablet Take 1,000 Units by mouth daily.   [DISCONTINUED] calcium citrate (CALCITRATE - DOSED IN MG ELEMENTAL CALCIUM) 950 MG tablet Take 200 mg of elemental calcium by mouth daily.   No facility-administered encounter medications on file as of 04/04/2022.      SIGNIFICANT DIAGNOSTIC EXAMS       ASSESSMENT/ PLAN:     NP [facility]  Contact [phone] Monday through Friday 8am- 5pm  After hours call [phone]

## 2022-04-06 ENCOUNTER — Other Ambulatory Visit (HOSPITAL_COMMUNITY)
Admission: RE | Admit: 2022-04-06 | Discharge: 2022-04-06 | Disposition: A | Discharge status: Home / Self Care | Payer: Medicare HMO | Source: Skilled Nursing Facility | Attending: Adult Health | Admitting: Adult Health

## 2022-04-06 DIAGNOSIS — E1122 Type 2 diabetes mellitus with diabetic chronic kidney disease: Secondary | ICD-10-CM | POA: Insufficient documentation

## 2022-04-06 DIAGNOSIS — N189 Chronic kidney disease, unspecified: Secondary | ICD-10-CM | POA: Diagnosis not present

## 2022-04-06 DIAGNOSIS — E559 Vitamin D deficiency, unspecified: Secondary | ICD-10-CM | POA: Insufficient documentation

## 2022-04-06 LAB — COMPREHENSIVE METABOLIC PANEL
ALT: 9 U/L (ref 0–44)
AST: 12 U/L — ABNORMAL LOW (ref 15–41)
Albumin: 3.2 g/dL — ABNORMAL LOW (ref 3.5–5.0)
Alkaline Phosphatase: 85 U/L (ref 38–126)
Anion gap: 7 (ref 5–15)
BUN: 20 mg/dL (ref 8–23)
CO2: 25 mmol/L (ref 22–32)
Calcium: 8.8 mg/dL — ABNORMAL LOW (ref 8.9–10.3)
Chloride: 104 mmol/L (ref 98–111)
Creatinine, Ser: 0.59 mg/dL (ref 0.44–1.00)
GFR, Estimated: >60 mL/min (ref >60)
Glucose, Bld: 97 mg/dL (ref 70–99)
Potassium: 4.1 mmol/L (ref 3.5–5.1)
Sodium: 136 mmol/L (ref 135–145)
Total Bilirubin: 0.3 mg/dL (ref 0.3–1.2)
Total Protein: 7 g/dL (ref 6.5–8.1)

## 2022-04-06 LAB — CBC
HCT: 39.7 % (ref 36.0–46.0)
Hemoglobin: 12.4 g/dL (ref 12.0–15.0)
MCH: 25.9 pg — ABNORMAL LOW (ref 26.0–34.0)
MCHC: 31.2 g/dL (ref 30.0–36.0)
MCV: 82.9 fL (ref 80.0–100.0)
Platelets: 363 10*3/uL (ref 150–400)
RBC: 4.79 MIL/uL (ref 3.87–5.11)
RDW: 15.9 % — ABNORMAL HIGH (ref 11.5–15.5)
WBC: 5.3 10*3/uL (ref 4.0–10.5)
nRBC: 0 % (ref 0.0–0.2)

## 2022-04-06 LAB — VITAMIN D 25 HYDROXY (VIT D DEFICIENCY, FRACTURES): Vit D, 25-Hydroxy: 19.98 ng/mL — ABNORMAL LOW (ref 30–100)

## 2022-04-06 LAB — HEMOGLOBIN A1C
Hgb A1c MFr Bld: 6 % — ABNORMAL HIGH (ref 4.8–5.6)
Mean Plasma Glucose: 125.5 mg/dL

## 2022-04-07 ENCOUNTER — Encounter (HOSPITAL_COMMUNITY)
Admission: RE | Admit: 2022-04-07 | Discharge: 2022-04-07 | Disposition: A | Discharge status: Home / Self Care | Payer: Medicare HMO | Source: Skilled Nursing Facility | Attending: Adult Health | Admitting: Adult Health

## 2022-04-07 DIAGNOSIS — E1122 Type 2 diabetes mellitus with diabetic chronic kidney disease: Secondary | ICD-10-CM | POA: Diagnosis present

## 2022-04-09 LAB — MICROALBUMIN, URINE: Microalb, Ur: <3 ug/mL — ABNORMAL HIGH

## 2022-04-13 ENCOUNTER — Inpatient Hospital Stay: Payer: Medicare HMO | Attending: Hematology

## 2022-04-13 DIAGNOSIS — D75839 Thrombocytosis, unspecified: Secondary | ICD-10-CM | POA: Diagnosis not present

## 2022-04-13 DIAGNOSIS — I10 Essential (primary) hypertension: Secondary | ICD-10-CM | POA: Insufficient documentation

## 2022-04-13 DIAGNOSIS — R7989 Other specified abnormal findings of blood chemistry: Secondary | ICD-10-CM | POA: Insufficient documentation

## 2022-04-13 DIAGNOSIS — Z7901 Long term (current) use of anticoagulants: Secondary | ICD-10-CM | POA: Insufficient documentation

## 2022-04-13 DIAGNOSIS — D509 Iron deficiency anemia, unspecified: Secondary | ICD-10-CM | POA: Insufficient documentation

## 2022-04-13 DIAGNOSIS — D500 Iron deficiency anemia secondary to blood loss (chronic): Secondary | ICD-10-CM

## 2022-04-13 DIAGNOSIS — Z87891 Personal history of nicotine dependence: Secondary | ICD-10-CM | POA: Insufficient documentation

## 2022-04-13 DIAGNOSIS — E119 Type 2 diabetes mellitus without complications: Secondary | ICD-10-CM | POA: Diagnosis not present

## 2022-04-13 LAB — CBC WITH DIFFERENTIAL/PLATELET
Abs Immature Granulocytes: 0.01 10*3/uL (ref 0.00–0.07)
Basophils Absolute: 0 10*3/uL (ref 0.0–0.1)
Basophils Relative: 1 %
Eosinophils Absolute: 0.1 10*3/uL (ref 0.0–0.5)
Eosinophils Relative: 2 %
HCT: 41.7 % (ref 36.0–46.0)
Hemoglobin: 12.8 g/dL (ref 12.0–15.0)
Immature Granulocytes: 0 %
Lymphocytes Relative: 22 %
Lymphs Abs: 1.3 10*3/uL (ref 0.7–4.0)
MCH: 26.1 pg (ref 26.0–34.0)
MCHC: 30.7 g/dL (ref 30.0–36.0)
MCV: 85.1 fL (ref 80.0–100.0)
Monocytes Absolute: 0.5 10*3/uL (ref 0.1–1.0)
Monocytes Relative: 8 %
Neutro Abs: 4 10*3/uL (ref 1.7–7.7)
Neutrophils Relative %: 67 %
Platelets: 319 10*3/uL (ref 150–400)
RBC: 4.9 MIL/uL (ref 3.87–5.11)
RDW: 15.9 % — ABNORMAL HIGH (ref 11.5–15.5)
WBC: 6 10*3/uL (ref 4.0–10.5)
nRBC: 0 % (ref 0.0–0.2)

## 2022-04-13 LAB — IRON AND TIBC
Iron: 91 ug/dL (ref 28–170)
Saturation Ratios: 33 % — ABNORMAL HIGH (ref 10.4–31.8)
TIBC: 277 ug/dL (ref 250–450)
UIBC: 186 ug/dL

## 2022-04-13 LAB — FERRITIN: Ferritin: 203 ng/mL (ref 11–307)

## 2022-04-19 NOTE — Progress Notes (Signed)
[place], [place] [address]   CLINIC:  Medical Oncology/Hematology  PCP:  Gerlene Fee, NP [place] [phone]   REASON FOR VISIT:  Follow-up for elevated D-dimer and iron deficiency anemia  PRIOR THERAPY:  Eliquis 5 mg twice daily  CURRENT THERAPY: None  INTERVAL HISTORY:  Anna Hanna 87 y.o. female returns for routine follow-up of her elevated D-dimer.  She was last seen by Tarri Abernethy PA-C on 01/12/2022.  At today's visit, she reports feeling fairly well.  No recent hospitalizations, surgeries, or changes in baseline health status.   She does not have any current signs of DVT or PE.  She denies any unilateral leg swelling, pain, and erythema.  She has not noticed any new shortness of breath, dyspnea on exertion, chest pain, hemoptysis, and palpitations.  She has not had any recent infections, surgeries, hospitalizations  She reports that she has intermittent soft, black-colored bowel movements, which she thinks may be from taking her iron pill twice weekly.  She has mild fatigue. She denies any pica, restless legs, headaches, chest pain, dyspnea on exertion, lightheadedness, or syncope.  She has 50% energy and100% appetite. She endorses that she is maintaining a stable weight.   REVIEW OF SYSTEMS:    Review of Systems  Constitutional:  Positive for fatigue. Negative for appetite change, chills, diaphoresis, fever and unexpected weight change.  HENT:   Negative for lump/mass and nosebleeds.   Eyes:  Negative for eye problems.  Respiratory:  Negative for cough, hemoptysis and shortness of breath.   Cardiovascular:  Negative for chest pain, leg swelling and palpitations.  Gastrointestinal:  Negative for abdominal pain, blood in stool, constipation, nausea and vomiting.  Genitourinary:  Positive for frequency. Negative for hematuria.   Skin: Negative.   Neurological:  Negative for dizziness,  headaches and light-headedness.  Hematological:  Does not bruise/bleed easily.  Psychiatric/Behavioral:  Positive for depression ("wants to go home, oesn't want to live at [facility]"). The patient is not nervous/anxious.       PAST MEDICAL/SURGICAL HISTORY:  Past Medical History:  Diagnosis Date   Abnormal posture    Per [facility] Electronic Medical Records System    Anemia    Cognitive communication deficit    Per [facility] Records [facility]    Contracture of left knee    Per [facility] Electronic Medical Records [facility]    Diabetes mellitus without complication ([place])    Difficulty walking    Per [facility] Electronic Medical Records [facility]    Extended spectrum beta lactamase (ESBL) resistance    Per [facility] Electronic Medical Records [facility]    Gastroesophageal reflux disease    Per [facility] Medical Records [facility]    Glaucoma    Gram negative sepsis ([place])    Per [facility] Electronic Medical Records [facility]    Hypercholesteremia    Hypertension    Hypokalemia    Infection, bacterial    Per [facility] Electronic Medical Records [facility]    Iron deficiency anemia due to chronic blood loss 11/23/2021   Muscle weakness (generalized)    Per [facility] Electronic Medical Records [facility]    Need for assistance with personal care    Per [facility] Records [facility]    OAB (overactive bladder)  Per [facility], [facility] Electronic [facility] Records [facility]    Ocular hypertension, bilateral    Per [facility] Records [facility]    Osteoarthritis of right knee    Per [facility] Electronic Medical Records [facility]    Paraplegia [facility])    Pyelonephritis, acute    Per [facility] Electronic  Medical Records System    Status post total right knee replacement    Unspecified dementia without behavioral disturbance    Per [facility] Electronic Medical Records [facility]    Urgency of urination    Per [facility] Records [facility]    Wheelchair dependence    Per [facility] Clever[facility], [facility] Electronic Medical Records [facility]    Past Surgical History:  Procedure Laterality Date   ABDOMINAL HYSTERECTOMY     CATARACT EXTRACTION     COLONOSCOPY N/A 12/10/2013   Danie Binder, MD ,Gi revealed redundant colon   TOTAL KNEE ARTHROPLASTY Right 03/30/2016   Procedure: TOTAL KNEE ARTHROPLASTY;  Surgeon: Carole Civil, MD;  Location: [facility];  Service: Orthopedics;  Laterality: Right;     SOCIAL HISTORY:  Social History   Socioeconomic History   Marital status: Divorced    Spouse name: Not on file   Number of children: Not on file   Years of education: Not on file   Highest education level: Not on file  Occupational History   Occupation: retired   Tobacco Use   Smoking status: Former    Packs/day: 0.25    Years: 3.00    Total pack years: 0.75    Types: Cigarettes   Smokeless tobacco: Never  Vaping Use   Vaping Use: Never used  Substance and Sexual Activity   Alcohol use: No   Drug use: No   Sexual activity: Not Currently    Birth control/protection: Post-menopausal  Other Topics Concern   Not on file  Social History Narrative   [facility] resident of [facility]    Social Determinants of Health   Financial Resource Strain: Not on file  Food Insecurity: Not on file  Transportation Needs: Not on file  Physical Activity: Inactive (02/17/2020)   Exercise Vital Sign    Days of Exercise per Week: 0 days    Minutes of Exercise per Session: 0 min  Stress: Not on file  Social Connections: Not on file  Intimate Partner Violence: Not At Risk (02/17/2020)   Humiliation, Afraid, Rape, and Kick questionnaire    Fear of Current or  Ex-Partner: No    Emotionally Abused: No    Physically Abused: No    Sexually Abused: No    FAMILY HISTORY:  Family History  Problem Relation Age of Onset   Cancer Mother    Arthritis Mother    Hypotension Father    Cancer Sister    Hypotension Sister    Colon cancer Neg Hx     CURRENT MEDICATIONS:  Outpatient Encounter Medications as of 04/20/2022  Medication Sig   acetaminophen (TYLENOL) 325 MG tablet Take 650 mg by mouth 2 (two) times daily.   ascorbic acid (VITAMIN C) 500 MG tablet Take 500 mg by mouth daily.   Calcium Carbonate (CALCIUM 600 PO) Take 1,500 mg by mouth daily.   ferrous sulfate 325 (65 FE) MG EC tablet Take 325 mg by mouth 2 (two) times a week. Monday and Thursday   guaiFENesin-dextromethorphan (ROBITUSSIN DM) 100-10 MG/5ML syrup Take 15 mLs by  mouth every 6 (six) hours as needed for cough.   latanoprost (XALATAN) 0.005 % ophthalmic solution Place 1 drop into both eyes at bedtime. wait 5 minutes between multiple eye drops   mirabegron ER (MYRBETRIQ) 50 MG TB24 tablet Take 50 mg by mouth daily.   NON FORMULARY Diet: NAS,   omeprazole (PRILOSEC) 20 MG capsule Take 20 mg by mouth 2 (two) times daily before a meal. Special Instructions: *TAKE ON AN EMPTY STOMACH* FOR GERD *DO NOT CRUSH* (FORMULARY SUB FOR PANTOPRAZOLE 40MG )   polyethylene glycol powder (GLYCOLAX/MIRALAX) 17 GM/SCOOP powder Take by mouth daily as needed.   vitamin D3 (CHOLECALCIFEROL) 25 MCG tablet Take 1,000 Units by mouth daily.   No facility-administered encounter medications on file as of 04/20/2022.    ALLERGIES:  No Known Allergies   PHYSICAL EXAM: ECOG PERFORMANCE STATUS: 3 - Symptomatic, >50% confined to bed    There were no vitals filed for this visit. There were no vitals filed for this visit. Physical Exam Constitutional:      Appearance: Normal appearance. She is obese.     Comments: Presents in wheelchair  HENT:     Head: Normocephalic and atraumatic.     Mouth/Throat:      Mouth: Mucous membranes are moist.  Eyes:     Extraocular Movements: Extraocular movements intact.     Pupils: Pupils are equal, round, and reactive to light.  Cardiovascular:     Rate and Rhythm: Normal rate and regular rhythm.     Pulses: Normal pulses.     Heart sounds: Normal heart sounds.  Pulmonary:     Effort: Pulmonary effort is normal.     Breath sounds: Normal breath sounds.  Abdominal:     General: Bowel sounds are normal.     Palpations: Abdomen is soft.     Tenderness: There is no abdominal tenderness.  Musculoskeletal:        General: No swelling.     Right lower leg: No edema.     Left lower leg: No edema.  Lymphadenopathy:     Cervical: No cervical adenopathy.  Skin:    General: Skin is warm and dry.  Neurological:     General: No focal deficit present.     Mental Status: She is alert and oriented to person, place, and time.  Psychiatric:        Mood and Affect: Mood normal.        Behavior: Behavior normal.      LABORATORY DATA:  I have reviewed the labs as listed.  CBC    Component Value Date/Time   WBC 6.0 04/13/2022 1131   RBC 4.90 04/13/2022 1131   HGB 12.8 04/13/2022 1131   HCT 41.7 04/13/2022 1131   HCT 26.4 (L) 04/03/2016 0550   PLT 319 04/13/2022 1131   MCV 85.1 04/13/2022 1131   MCH 26.1 04/13/2022 1131   MCHC 30.7 04/13/2022 1131   RDW 15.9 (H) 04/13/2022 1131   LYMPHSABS 1.3 04/13/2022 1131   MONOABS 0.5 04/13/2022 1131   EOSABS 0.1 04/13/2022 1131   BASOSABS 0.0 04/13/2022 1131      Latest Ref Rng & Units 04/06/2022    4:15 AM 01/04/2022    1:02 PM 05/24/2021    8:32 AM  CMP  Glucose 70 - 99 mg/dL 97  88  92   BUN 8 - 23 mg/dL 20  14  16    Creatinine 0.44 - 1.00 mg/dL 0.59  0.63  0.46   Sodium 135 -  145 mmol/L 136  134  137   Potassium 3.5 - 5.1 mmol/L 4.1  4.8  3.8   Chloride 98 - 111 mmol/L 104  101  104   CO2 22 - 32 mmol/L 25  25  26    Calcium 8.9 - 10.3 mg/dL 8.8  8.8  8.5   Total Protein 6.5 - 8.1 g/dL 7.0  7.7  6.8    Total Bilirubin 0.3 - 1.2 mg/dL 0.3  0.4  0.2   Alkaline Phos 38 - 126 U/L 85  80  59   AST 15 - 41 U/L 12  15  11    ALT 0 - 44 U/L 9  11  10      DIAGNOSTIC IMAGING:  I have independently reviewed the relevant imaging and discussed with the patient.  ASSESSMENT & PLAN: 1.  Prolonged elevated d-dimer on anticoagulation therapy: - From extensive review of the chart, it appears that she had COVID infection with elevated D-dimer in June 2022. - Eliquis was started at low-dose of 2.5 mg twice daily around 01/19/2021.  At some point she was switched to Lovenox and then switched back to Eliquis at 5 mg twice daily which she remains on at this time. - She had a CT chest angiogram on 02/18/2021 on 05/05/2021 both of which were negative for pulmonary embolism.  Lower extremity Doppler on 02/24/2021 was negative for DVT. - Denies any recent infections or hospitalizations.  Denies any recent surgeries. - Denies any B symptoms. - Denies bleeding per rectum or melena.  Last colonoscopy was in 2015 which was normal, however was found to be Hemoccult positive x2 in December 2022.  Patient saw GI (Dr. Abbey Chatters) on 08/11/2021, but declined EGD/colonoscopy. - CTAP on 09/03/2019 showed aortobiiliac atherosclerosis with no aneurysm.  No enlarged abdominal pelvic lymph nodes.  Spleen was normal in size with no abnormality. - 2D echocardiogram (09/20/2021): LVEF 55 to 52%, grade 1 diastolic dysfunction.  No major valvulopathy is noted. -Hematology work-up (08/26/2021) ruled out chronic DIC Fibrinogen minimally elevated at 477  Elevated factor VIII at 233% Normal factor V. Elevated INR 1.4/PT 17.1, APTT normal at 32. - Most recent D-dimer (11/22/2021): Markedly elevated at > 20 - CT abdomen pelvis was obtained on 01/04/2022 to rule out occult malignancy in the setting of elevated D-dimer, Hemoccult positive stools, and iron deficiency anemia - results revealed left breast calcification suspected to be benign (stable compared  to CT abdomen/pelvis from February 2021), no findings suspicious for malignancy. - Discussed with Dr. Delton Coombes, who recommends against further D-dimer testing at this time, unless patient is exhibiting symptoms concerning for possible blood clots.   - Discussed with patient that there is wide differential for elevated D-dimer, including clotting, inflammation, infection, and malignancy. - PLAN: No indication for anticoagulation or ongoing testing of D-dimer.  We will discontinue Eliquis and recommend that D-dimer only be checked if there is clinical suspicion for DVT or PE.  2.  Left breast lesion - Patient has history of oil cyst in the left breast, excisional biopsy performed on 09/17/2002, but I am unable to review pathology reports.  Presumably benign, since patient denies any history of breast cancer.  - Last mammogram on record was 07/01/2015, which is BI-RADS Category 1 negative, no mammographic evidence of malignancy.  No mention of left breast calcification made and report, but it is clearly visible on my personal review of images, nonconcerning in appearance (round, uniform macrocalcification).   - Lesion noted on CT abdomen/pelvis with contrast  on 09/03/2019 as a "peripherally calcified 2.2 cm lesion in the left breast/low left axilla partially included" - CT abdomen/pelvis (01/04/2022): "Peripherally calcified nodular density in left breast is rounded measuring 18 mm and appears unchanged" - Suspected to be benign calcification of left breast, given appearance and stability dating back to mammogram in 2016 and prior.   - PLAN: No further work-up or follow-up needed  3.  Iron deficiency anemia - Hemoccult positive x2 in December 2022 - Patient saw GI (Dr. Abbey Chatters) on 08/11/2021, but declined further work-up with EGD/colonoscopy. - Labs from 08/26/2021 show Hgb 9.9/MCV 77.4.  Ferritin 10 with iron saturation 5%.  Mild thrombocytosis with platelets 410. - Additional labs (08/26/2021): Normal SPEP.   Normal B12, methylmalonic acid, folate, copper. - She reports black stool, but thinks that this may be from her iron tablet. - She is taking daily iron polysaccharide tablet. - Most recent IV iron Venofer 300 mg x 3 from 01/20/2022 through 02/17/2022  - Most recent labs (04/13/2022): Hgb 12.8/MCV 85.1, ferritin 203, iron saturation 33% - CT abdomen/pelvis (01/04/2022): No evidence of GI tract malignancy - Discussed with patient that she likely has chronic GI blood loss from unknown source and that while her CT scan did not show any evidence of malignancy, this does not mean that it is completely excluded.  She declines further work-up at this time.  She would like to treat supportively with iron repletion as needed. - Blood and iron levels have improved after discontinuation of Eliquis - PLAN: No indication for IV iron at this time - Repeat labs and RTC in 6 months  3.  Thrombocytosis - Intermittent mild thrombocytosis noted since at least 2018 - Resolved after iron supplementation  4.  Social/family history: - She reports that she has been at [facility] for the last couple of years after she broke her right leg and cannot walk.  She is wheelchair dependent.  She worked in a [employer] prior to retirement.  Quit smoking 2 years ago.  Smoked half pack per day for 15 years. - No family history of malignancies.   PLAN SUMMARY & DISPOSITION: Labs and RTC in 6 months  All questions were answered. The patient knows to call the clinic with any problems, questions or concerns.  Medical decision making: Low  Time spent on visit: I spent 15 minutes counseling the patient face to face. The total time spent in the appointment was 22 minutes and more than 50% was on counseling.   Harriett Rush, PA-C  04/20/22 2:43 PM

## 2022-04-19 NOTE — Progress Notes (Incomplete)
[place], [place] [address]   CLINIC:  Medical Oncology/Hematology  PCP:  Gerlene Fee, NP [place] [id number] [phone]   REASON FOR VISIT:  Follow-up for elevated D-dimer and iron deficiency anemia  PRIOR THERAPY:  Eliquis 5 mg twice daily  CURRENT THERAPY: None  INTERVAL HISTORY:  Anna Hanna 87 y.o. female returns for routine follow-up of her elevated D-dimer.  She was last seen by Tarri Abernethy PA-C on 01/12/2022.  At today's visit, she reports feeling fairly well.  No recent hospitalizations, surgeries, or changes in baseline health status.  *** *** She does not have any current signs of DVT or PE. *** She denies any unilateral leg swelling, pain, and erythema. *** She has not noticed any new shortness of breath, dyspnea on exertion, chest pain, hemoptysis, and palpitations. *** She has not had any recent infections, surgeries, hospitalizations  She remains on Eliquis at this time.  *** *** She reports that she has soft, black-colored bowel movements, which she thinks may be from taking her iron pill. *** She has mild fatigue. *** She denies any pica, restless legs, headaches, chest pain, dyspnea on exertion, lightheadedness, or syncope.  She has***% energy and***% appetite. She endorses that she is maintaining a stable weight.   REVIEW OF SYSTEMS:   *** Review of Systems  Constitutional:  Positive for fatigue. Negative for appetite change, chills, diaphoresis, fever and unexpected weight change.  HENT:   Negative for lump/mass and nosebleeds.   Eyes:  Negative for eye problems.  Respiratory:  Positive for cough. Negative for hemoptysis and shortness of breath.   Cardiovascular:  Negative for chest pain, leg swelling and palpitations.  Gastrointestinal:  Positive for diarrhea. Negative for abdominal pain, blood in stool, constipation, nausea and vomiting.  Genitourinary:  Positive for frequency.  Negative for hematuria.   Skin: Negative.   Neurological:  Negative for dizziness, headaches and light-headedness.  Hematological:  Does not bruise/bleed easily.  Psychiatric/Behavioral:  Positive for depression. The patient is nervous/anxious.       PAST MEDICAL/SURGICAL HISTORY:  Past Medical History:  Diagnosis Date  . Abnormal posture    Per [facility] Electronic Medical Records System   . Anemia   . Cognitive communication deficit    Per [facility] Records [facility]   . Contracture of left knee    Per [facility] Records [facility]   . Diabetes mellitus without complication ([place])   . Difficulty walking    Per [facility] Records [facility]   . Extended spectrum beta lactamase (ESBL) resistance    Per [facility] Records [facility]   . Gastroesophageal reflux disease    Per [facility] Records [facility]   . Glaucoma   . Gram negative sepsis ([place])    Per [facility] Records [facility]   . Hypercholesteremia   . Hypertension   . Hypokalemia   . Infection, bacterial    Per [facility] Records [facility]   . Iron deficiency anemia due to chronic blood loss 11/23/2021  . Muscle weakness (generalized)    Per [facility] Records [facility]   . Need for assistance with personal care    Per [facility] Records [facility]   . OAB (overactive bladder)  Per [facility], [facility] Records [facility]   . Ocular hypertension, bilateral    Per [facility] Records [facility]   . Osteoarthritis of right knee    Per [facility] Records [facility]   . Paraplegia ([place])   . Pyelonephritis,  acute    Per [facility] Records [facility]   . Status post total right knee replacement   . Unspecified dementia without behavioral disturbance    Per [facility] Records [facility]   . Urgency of urination    Per [facility] Records [facility]   . Wheelchair dependence    Per [employer], [facility] Electronic Medical Records System    Past Surgical History:  Procedure Laterality Date  . ABDOMINAL HYSTERECTOMY    . CATARACT EXTRACTION    . COLONOSCOPY N/A 12/10/2013   Danie Binder, MD ,Gi revealed redundant colon  . TOTAL KNEE ARTHROPLASTY Right 03/30/2016   Procedure: TOTAL KNEE ARTHROPLASTY;  Surgeon: Carole Civil, MD;  Location: [facility];  Service: Orthopedics;  Laterality: Right;     SOCIAL HISTORY:  Social History   Socioeconomic History  . Marital status: Divorced    Spouse name: Not on file  . Number of children: Not on file  . Years of education: Not on file  . Highest education level: Not on file  Occupational History  . Occupation: retired   Tobacco Use  . Smoking status: Former    Packs/day: 0.25    Years: 3.00    Total pack years: 0.75    Types: Cigarettes  . Smokeless tobacco: Never  Vaping Use  . Vaping Use: Never used  Substance and Sexual Activity  . Alcohol use: No  . Drug use: No  . Sexual activity: Not Currently    Birth control/protection: Post-menopausal  Other Topics Concern  . Not on file  Social History Narrative   [facility] resident of [facility]    Social Determinants of Health   Financial Resource Strain: Not on file  Food Insecurity: Not on file  Transportation Needs: Not on file  Physical Activity: Inactive (02/17/2020)   Exercise Vital Sign   . Days of Exercise per Week: 0 days   . Minutes of Exercise per Session: 0 min  Stress: Not on file  Social Connections: Not on file  Intimate Partner Violence: Not At Risk  (02/17/2020)   Humiliation, Afraid, Rape, and Kick questionnaire   . Fear of Current or Ex-Partner: No   . Emotionally Abused: No   . Physically Abused: No   . Sexually Abused: No    FAMILY HISTORY:  Family History  Problem Relation Age of Onset  . Cancer Mother   . Arthritis Mother   . Hypotension Father   . Cancer Sister   . Hypotension Sister   . Colon cancer Neg Hx     CURRENT MEDICATIONS:  Outpatient Encounter Medications as of 04/20/2022  Medication Sig  . acetaminophen (TYLENOL) 325 MG tablet Take 650 mg by mouth 2 (two) times daily.  Marland Kitchen ascorbic acid (VITAMIN C) 500 MG tablet Take 500 mg by mouth daily.  . Calcium Carbonate (CALCIUM 600 PO) Take 1,500 mg by mouth daily.  . ferrous sulfate 325 (65 FE) MG EC tablet Take 325 mg by mouth 2 (two) times a week. Monday and Thursday  . guaiFENesin-dextromethorphan (ROBITUSSIN DM) 100-10 MG/5ML syrup Take 15 mLs by  mouth every 6 (six) hours as needed for cough.  . latanoprost (XALATAN) 0.005 % ophthalmic solution Place 1 drop into both eyes at bedtime. wait 5 minutes between multiple eye drops  . mirabegron ER (MYRBETRIQ) 50 MG TB24 tablet Take 50 mg by mouth daily.  . NON FORMULARY Diet: NAS,  . omeprazole (PRILOSEC) 20 MG capsule Take 20 mg by mouth 2 (two) times daily before a meal. Special Instructions: *TAKE ON AN EMPTY STOMACH* FOR GERD *DO NOT CRUSH* (FORMULARY SUB FOR PANTOPRAZOLE 40MG )  . polyethylene glycol powder (GLYCOLAX/MIRALAX) 17 GM/SCOOP powder Take by mouth daily as needed.  . vitamin D3 (CHOLECALCIFEROL) 25 MCG tablet Take 1,000 Units by mouth daily.   No facility-administered encounter medications on file as of 04/20/2022.    ALLERGIES:  No Known Allergies   PHYSICAL EXAM:*** ECOG PERFORMANCE STATUS: 3 - Symptomatic, >50% confined to bed    There were no vitals filed for this visit. There were no vitals filed for this visit. Physical Exam Constitutional:      Appearance: Normal appearance. She is obese.      Comments: Presents in wheelchair  HENT:     Head: Normocephalic and atraumatic.     Mouth/Throat:     Mouth: Mucous membranes are moist.  Eyes:     Extraocular Movements: Extraocular movements intact.     Pupils: Pupils are equal, round, and reactive to light.  Cardiovascular:     Rate and Rhythm: Normal rate and regular rhythm.     Pulses: Normal pulses.     Heart sounds: Normal heart sounds.  Pulmonary:     Effort: Pulmonary effort is normal.     Breath sounds: Normal breath sounds.  Abdominal:     General: Bowel sounds are normal.     Palpations: Abdomen is soft.     Tenderness: There is no abdominal tenderness.  Musculoskeletal:        General: No swelling.     Right lower leg: No edema.     Left lower leg: No edema.  Lymphadenopathy:     Cervical: No cervical adenopathy.  Skin:    General: Skin is warm and dry.  Neurological:     General: No focal deficit present.     Mental Status: She is alert and oriented to person, place, and time.  Psychiatric:        Mood and Affect: Mood normal.        Behavior: Behavior normal.      LABORATORY DATA:  I have reviewed the labs as listed.  CBC    Component Value Date/Time   WBC 6.0 04/13/2022 1131   RBC 4.90 04/13/2022 1131   HGB 12.8 04/13/2022 1131   HCT 41.7 04/13/2022 1131   HCT 26.4 (L) 04/03/2016 0550   PLT 319 04/13/2022 1131   MCV 85.1 04/13/2022 1131   MCH 26.1 04/13/2022 1131   MCHC 30.7 04/13/2022 1131   RDW 15.9 (H) 04/13/2022 1131   LYMPHSABS 1.3 04/13/2022 1131   MONOABS 0.5 04/13/2022 1131   EOSABS 0.1 04/13/2022 1131   BASOSABS 0.0 04/13/2022 1131      Latest Ref Rng & Units 04/06/2022    4:15 AM 01/04/2022    1:02 PM 05/24/2021    8:32 AM  CMP  Glucose 70 - 99 mg/dL 97  88  92   BUN 8 - 23 mg/dL 20  14  16    Creatinine 0.44 - 1.00 mg/dL 0.59  0.63  0.46   Sodium 135 -  145 mmol/L 136  134  137   Potassium 3.5 - 5.1 mmol/L 4.1  4.8  3.8   Chloride 98 - 111 mmol/L 104  101  104   CO2 22 -  32 mmol/L 25  25  26    Calcium 8.9 - 10.3 mg/dL 8.8  8.8  8.5   Total Protein 6.5 - 8.1 g/dL 7.0  7.7  6.8   Total Bilirubin 0.3 - 1.2 mg/dL 0.3  0.4  0.2   Alkaline Phos 38 - 126 U/L 85  80  59   AST 15 - 41 U/L 12  15  11    ALT 0 - 44 U/L 9  11  10      DIAGNOSTIC IMAGING:  I have independently reviewed the relevant imaging and discussed with the patient.  ASSESSMENT & PLAN: 1.  Prolonged elevated d-dimer on anticoagulation therapy: - From extensive review of the chart, it appears that she had COVID infection with elevated D-dimer in June 2022. - Eliquis was started at low-dose of 2.5 mg twice daily around 01/19/2021.  At some point she was switched to Lovenox and then switched back to Eliquis at 5 mg twice daily which she remains on at this time. - She had a CT chest angiogram on 02/18/2021 on 05/05/2021 both of which were negative for pulmonary embolism.  Lower extremity Doppler on 02/24/2021 was negative for DVT. - Denies any recent infections or hospitalizations.  Denies any recent surgeries. - Denies any B symptoms. - Denies bleeding per rectum or melena.  Last colonoscopy was in 2015 which was normal, however was found to be Hemoccult positive x2 in December 2022.  Patient saw GI (Dr. Abbey Chatters) on 08/11/2021, but declined EGD/colonoscopy. - CTAP on 09/03/2019 showed aortobiiliac atherosclerosis with no aneurysm.  No enlarged abdominal pelvic lymph nodes.  Spleen was normal in size with no abnormality. - 2D echocardiogram (09/20/2021): LVEF 55 to 123456, grade 1 diastolic dysfunction.  No major valvulopathy is noted. -Hematology work-up (08/26/2021) ruled out chronic DIC Fibrinogen minimally elevated at 477  Elevated factor VIII at 233% Normal factor V. Elevated INR 1.4/PT 17.1, APTT normal at 32. - Most recent D-dimer (11/22/2021): Markedly elevated at > 20 - CT abdomen pelvis was obtained on 01/04/2022 to rule out occult malignancy in the setting of elevated D-dimer, Hemoccult positive stools, and  iron deficiency anemia - results revealed left breast calcification suspected to be benign (stable compared to CT abdomen/pelvis from February 2021), no findings suspicious for malignancy. - Discussed with Dr. Delton Coombes, who recommends against further D-dimer testing at this time, unless patient is exhibiting symptoms concerning for possible blood clots.   - Discussed with patient that there is wide differential for elevated D-dimer, including clotting, inflammation, infection, and malignancy. - PLAN: No indication for anticoagulation or ongoing testing of D-dimer.  We will discontinue Eliquis and recommend that D-dimer only be checked if there is clinical suspicion for DVT or PE.  2.  Left breast lesion - Patient has history of oil cyst in the left breast, excisional biopsy performed on 09/17/2002, but I am unable to review pathology reports.  Presumably benign, since patient denies any history of breast cancer.  - Last mammogram on record was 07/01/2015, which is BI-RADS Category 1 negative, no mammographic evidence of malignancy.  No mention of left breast calcification made and report, but it is clearly visible on my personal review of images, nonconcerning in appearance (round, uniform macrocalcification).   - Lesion noted on CT abdomen/pelvis with contrast  on 09/03/2019 as a "peripherally calcified 2.2 cm lesion in the left breast/low left axilla partially included" - CT abdomen/pelvis (01/04/2022): "Peripherally calcified nodular density in left breast is rounded measuring 18 mm and appears unchanged" - Suspected to be benign calcification of left breast, given appearance and stability dating back to mammogram in 2016 and prior.   - PLAN: No further work-up or follow-up needed  3.  Iron deficiency anemia - Hemoccult positive x2 in December 2022 - Patient saw GI (Dr. Abbey Chatters) on 08/11/2021, but declined further work-up with EGD/colonoscopy. - Labs from 08/26/2021 show Hgb 9.9/MCV 77.4.  Ferritin 10  with iron saturation 5%.  Mild thrombocytosis with platelets 410. - Additional labs (08/26/2021): Normal SPEP.  Normal B12, methylmalonic acid, folate, copper. - She reports black stool, but thinks that this may be from her iron tablet. - She is taking daily iron polysaccharide tablet. - She received IV iron with Venofer 300 mg x 3 from 12/02/2021 through 12/16/2021 - Most recent labs (01/04/2022): Hgb improved at 11.7 with resolution of thrombocytosis.  Ferritin 90, iron saturation 14%. - CT abdomen/pelvis (01/04/2022): No evidence of GI tract malignancy - Discussed with patient that she likely has chronic GI blood loss from unknown source and that while her CT scan did not show any evidence of malignancy, this does not mean that it is completely excluded.  She declines further work-up at this time.  She would like to treat supportively with iron repletion as needed.   - PLAN: Recommend additional IV iron with Venofer 300 mg x 3 (goal ferritin >100 and iron saturation >20%)   - Repeat labs and RTC in 3 months  3.  Thrombocytosis - Intermittent mild thrombocytosis noted since at least 2018 - Resolved after iron supplementation  4.  Social/family history: - She reports that she has been at [facility] for the last couple of years after she broke her right leg and cannot walk.  She is wheelchair dependent.  She worked in a [employer] prior to retirement.  Quit smoking 2 years ago.  Smoked half pack per day for 15 years. - No family history of malignancies.   PLAN SUMMARY & DISPOSITION: IV Venofer 300 mg x 3  Repeat labs and RTC in 3 months  All questions were answered. The patient knows to call the clinic with any problems, questions or concerns.  Medical decision making: Moderate    Time spent on visit: I spent 20 minutes counseling the patient face to face. The total time spent in the appointment was 30 minutes and more than 50% was on counseling.   Harriett Rush, PA-C    04/19/22 11:58 PM

## 2022-04-20 ENCOUNTER — Inpatient Hospital Stay (HOSPITAL_BASED_OUTPATIENT_CLINIC_OR_DEPARTMENT_OTHER): Payer: Medicare HMO | Admitting: Physician Assistant

## 2022-04-20 VITALS — BP 112/55 | HR 93 | Temp 98.6°F | Resp 18

## 2022-04-20 DIAGNOSIS — D509 Iron deficiency anemia, unspecified: Secondary | ICD-10-CM | POA: Diagnosis not present

## 2022-04-20 DIAGNOSIS — D500 Iron deficiency anemia secondary to blood loss (chronic): Secondary | ICD-10-CM | POA: Diagnosis not present

## 2022-04-20 NOTE — Patient Instructions (Signed)
[place] at [facility] Discharge Instructions  You were seen today by Tarri Abernethy PA-C for your follow-up visit.  IRON DEFICIENCY ANEMIA: Your blood and iron levels look great at today's visit!  You do not need any IV iron at this time.  You should continue to take iron tablet twice a week.  FOLLOW-UP APPOINTMENT: Repeat labs and office visit in about 6 months.  ** Thank you for trusting me with your healthcare!  I strive to provide all of my patients with quality care at each visit.  If you receive a survey for this visit, I would be so grateful to you for taking the time to provide feedback.  Thank you in advance!  ~ Clinten Howk                   Dr. Derek Jack   &   Tarri Abernethy, PA-C   - - - - - - - - - - - - - - - - - -     Thank you for choosing [place] at [facility] to provide your oncology and hematology care.  To afford each patient quality time with our provider, please arrive at least 15 minutes before your scheduled appointment time.   If you have a lab appointment with the Ages please come in thru the Main Entrance and check in at the main information desk.  You need to re-schedule your appointment should you arrive 10 or more minutes late.  We strive to give you quality time with our providers, and arriving late affects you and other patients whose appointments are after yours.  Also, if you no show three or more times for appointments you may be dismissed from the clinic at the providers discretion.     Again, thank you for choosing [facility].  Our hope is that these requests will decrease the amount of time that you wait before being seen by our physicians.       _____________________________________________________________  Should you have questions after your visit to [facility], please contact our office at [phone] and follow the prompts.  Our office hours are  8:00 a.m. and 4:30 p.m. Monday - Friday.  Please note that voicemails left after 4:00 p.m. may not be returned until the following business day.  We are closed weekends and major holidays.  You do have access to a nurse 24-7, just call the main number to the clinic [phone] and do not press any options, hold on the line and a nurse will answer the phone.    For prescription refill requests, have your pharmacy contact our office and allow 72 hours.    Due to Covid, you will need to wear a mask upon entering the hospital. If you do not have a mask, a mask will be given to you at the Main Entrance upon arrival. For doctor visits, patients may have 1 support person age 71 or older with them. For treatment visits, patients can not have anyone with them due to social distancing guidelines and our immunocompromised population.

## 2022-05-03 ENCOUNTER — Non-Acute Institutional Stay (SKILLED_NURSING_FACILITY): Payer: Medicare HMO | Admitting: Internal Medicine

## 2022-05-03 ENCOUNTER — Encounter: Payer: Self-pay | Admitting: Internal Medicine

## 2022-05-03 DIAGNOSIS — I129 Hypertensive chronic kidney disease with stage 1 through stage 4 chronic kidney disease, or unspecified chronic kidney disease: Secondary | ICD-10-CM

## 2022-05-03 DIAGNOSIS — E1322 Other specified diabetes mellitus with diabetic chronic kidney disease: Secondary | ICD-10-CM

## 2022-05-03 DIAGNOSIS — L299 Pruritus, unspecified: Secondary | ICD-10-CM | POA: Diagnosis not present

## 2022-05-03 DIAGNOSIS — D500 Iron deficiency anemia secondary to blood loss (chronic): Secondary | ICD-10-CM | POA: Diagnosis not present

## 2022-05-03 DIAGNOSIS — E559 Vitamin D deficiency, unspecified: Secondary | ICD-10-CM | POA: Diagnosis not present

## 2022-05-03 DIAGNOSIS — I10 Essential (primary) hypertension: Secondary | ICD-10-CM

## 2022-05-03 DIAGNOSIS — N182 Chronic kidney disease, stage 2 (mild): Secondary | ICD-10-CM

## 2022-05-03 NOTE — Assessment & Plan Note (Signed)
Current vitamin D level 19.98; 1000 units of vitamin D supplementation initiated 04/04/2022. Calcium minimally reduced at 8.8.

## 2022-05-03 NOTE — Assessment & Plan Note (Signed)
Current A1c is prediabetic at 6%.  No change indicated.

## 2022-05-03 NOTE — Progress Notes (Signed)
NURSING HOME LOCATION:  [facility] ROOM NUMBER:  [id number]  CODE STATUS:  Full Code  PCP:  Ok Edwards NP  This is a nursing facility follow up visit of chronic medical diagnoses & to document compliance with Regulation 483.30 (c) in The [place] Manual Phase 2 which mandates caregiver visit ( visits can alternate among physician, PA or NP as per statutes) within 10 days of 30 days / 60 days/ 90 days post admission to SNF date    Interim medical record and care since last SNF visit was updated with review of diagnostic studies and change in clinical status since last visit were documented.  HPI: She is a permanent resident of this facility with medical diagnoses of iron deficiency anemia, GERD, vitamin D deficiency, dyslipidemia, essential hypertension, OAB, and diabetes with neurovascular complications. Labs are current as of 04/06/2022.  Minimal hypocalcemia is present with a value of 8.8.  Vitamin D level was reduced at 19.98.  Albumin was reduced to 3.2 but total protein was 7.  Iron panel reveals normal iron levels and normal ferritin levels.  Anemia has resolved with current H/H of 12.8/41.7.  A1c was prediabetic at 6%.  Review of systems: Dementia invalidated responses.  She stated that she was "doing fine.".  She then went on to state that her right leg was "sore."  Finally she describes "itching all over with welts."  She indicated that the itching was particularly symptomatic in the area of her "kitty cat" pointing to the vaginal area.  Constitutional: No fever, significant weight change, fatigue  Eyes: No redness, discharge, pain, vision change ENT/mouth: No nasal congestion,  purulent discharge, earache, change in hearing, sore throat  Cardiovascular: No chest pain, palpitations, paroxysmal nocturnal dyspnea, claudication, edema  Respiratory: No cough, sputum production, hemoptysis, DOE, significant snoring, apnea   Gastrointestinal: No heartburn,  dysphagia, abdominal pain, nausea /vomiting, rectal bleeding, melena, change in bowels Genitourinary: No dysuria, hematuria, pyuria, incontinence, nocturia Dermatologic: No rash, change in appearance of skin Neurologic: No dizziness, headache, syncope, seizures, numbness, tingling Psychiatric: No significant anxiety, depression, insomnia, anorexia Endocrine: No change in hair/skin/nails, excessive thirst, excessive hunger, excessive urination  Hematologic/lymphatic: No significant bruising, lymphadenopathy, abnormal bleeding Allergy/immunology: No itchy/watery eyes, significant sneezing,angioedema  Physical exam:  Pertinent or positive findings: Facies are blank.  She is edentulous.  Slight tachycardia was present clinically.  There was slight bronchovesicular quality to breath sounds.  Abdomen is protuberant.  Pedal pulses are nonpalpable.  She has flexion contractions of isolated fingers most notable in the fifth finger bilaterally.  General appearance: Adequately nourished; no acute distress, increased work of breathing is present.   Lymphatic: No lymphadenopathy about the head, neck, axilla. Eyes: No conjunctival inflammation or lid edema is present. There is no scleral icterus. Ears:  External ear exam shows no significant lesions or deformities.   Nose:  External nasal examination shows no deformity or inflammation. Nasal mucosa are pink and moist without lesions, exudates Oral exam:  Lips and gums are healthy appearing. Neck:  No thyromegaly, masses, tenderness noted.    Heart:  No gallop, murmur, click, rub .  Lungs:  without wheezes, rhonchi, rales, rubs. Abdomen: Bowel sounds are normal. Abdomen is soft and nontender with no organomegaly, hernias, masses. GU: Deferred  Extremities:  No cyanosis, clubbing, edema  Neurologic exam :Balance, Rhomberg, finger to nose testing could not be completed due to clinical state Skin: Warm & dry w/o tenting. No significant lesions or  rash.  See summary under each active problem in the Problem List with associated updated therapeutic plan

## 2022-05-03 NOTE — Patient Instructions (Signed)
See assessment and plan under each diagnosis in the problem list and acutely for this visit 

## 2022-05-03 NOTE — Assessment & Plan Note (Signed)
Iron and ferritin levels are normal and anemia has resolved.  Current H/H 12.8/41.7.  No bleeding dyscrasias reported by staff.

## 2022-05-03 NOTE — Assessment & Plan Note (Addendum)
She describes "itching all over with welts."  She denies other extrinsic symptoms.  No significant rash visualized.  Eosinophil count normal.  Trial of nonsedating antihistamine such as loratadine or fexofenadine will be discussed with NP.

## 2022-05-03 NOTE — Assessment & Plan Note (Signed)
Blood pressure is well controlled without antihypertensive medications.  No change indicated.

## 2022-05-19 ENCOUNTER — Encounter: Payer: Self-pay | Admitting: Adult Health

## 2022-05-19 ENCOUNTER — Non-Acute Institutional Stay (SKILLED_NURSING_FACILITY): Payer: Medicare HMO | Admitting: Adult Health

## 2022-05-19 DIAGNOSIS — F01B0 Vascular dementia, moderate, without behavioral disturbance, psychotic disturbance, mood disturbance, and anxiety: Secondary | ICD-10-CM | POA: Diagnosis not present

## 2022-05-19 DIAGNOSIS — I10 Essential (primary) hypertension: Secondary | ICD-10-CM

## 2022-05-19 DIAGNOSIS — I700 Atherosclerosis of aorta: Secondary | ICD-10-CM

## 2022-05-19 NOTE — Progress Notes (Unsigned)
Location:  [facility] Room Number: 105 Place of Service:  SNF (31) Provider: Synthia Innocent, NP   CODE STATUS: FULL CODE  No Known Allergies  Chief Complaint  Patient presents with   Acute Visit    Care plan meeting    HPI:  We have come together for her care plan meeting. BIMS 5/15 mood: 4/30: nervous, depression. Is nonambulatory without falls. Moderate assist with upper body and max assist with lower body frequently incontinent of bladder and incontinent of bowel. Dietary: feeds self; 215 pounds; NAS  appetite 75-100% . Therapy: none at this time. Activities: participates. Does wear orthotic on knee at hs. She will continue to be followed for her chronic illnesses including: Aortic atherosclerosis  Essential benign hypertension Moderate vascular dementia without behavioral disturbance; psychotic disturbance; mood disturbance or anxiety  Past Medical History:  Diagnosis Date   Abnormal posture    Per [employer], [facility] Electronic [facility] Records [facility]    Anemia    Cognitive communication deficit    Per [facility], [facility] Records [facility]    Contracture of left knee    Per [facility], [facility] Records [facility]    Diabetes mellitus without complication (HCC)    Difficulty walking    Per [employer], [facility] Records [facility]    Extended spectrum beta lactamase (ESBL) resistance    Per [employer], [facility] Records [facility]    Gastroesophageal reflux disease    Per [employer], [facility] Records [facility]    Glaucoma    Gram negative sepsis (HCC)    Per [facility], [facility] Records [facility]    Hypercholesteremia    Hypertension    Hypokalemia    Infection, bacterial    Per [employer], [facility] Records [facility]    Iron deficiency anemia due to chronic blood loss  11/23/2021   Muscle weakness (generalized)    Per [employer], [facility] Records [facility]    Need for assistance with personal care    Per [facility], [facility] Records [facility]    OAB (overactive bladder)    Per [facility], [facility]    Ocular hypertension, bilateral    Per [employer], [facility] Records [facility]    Osteoarthritis of right knee    Per [facility], [facility] Records [facility]    Paraplegia [facility])    Pyelonephritis, acute    Per [employer], [facility] Records [facility]    Status post total right knee replacement    Unspecified dementia without behavioral disturbance    Per [employer], [facility] Records [facility]    Urgency of urination    Per [employer], [facility]    Wheelchair dependence    Per Ria [facility], [facility] Records [facility]     Past Surgical History:  Procedure Laterality Date   ABDOMINAL HYSTERECTOMY     CATARACT EXTRACTION     COLONOSCOPY N/A 12/10/2013   West Bali, MD ,Gi revealed redundant colon   TOTAL KNEE ARTHROPLASTY Right 03/30/2016   Procedure: TOTAL KNEE ARTHROPLASTY;  Surgeon: Vickki Hearing, MD;  Location: [facility];  Service: Orthopedics;  Laterality: Right;    Social History   Socioeconomic History   Marital status: Divorced    Spouse  name: Not on file   Number of children: Not on file   Years of education: Not on file   Highest education level: Not on file  Occupational History   Occupation: retired   Tobacco Use   Smoking status: Former    Packs/day: 0.25    Years: 3.00    Total pack years: 0.75    Types: Cigarettes   Smokeless tobacco: Never  Vaping Use   Vaping Use: Never used  Substance and Sexual Activity   Alcohol use: No   Drug use: No   Sexual activity: Not  Currently    Birth control/protection: Post-menopausal  Other Topics Concern   Not on file  Social History Narrative   Long term resident of [facility]    Social Determinants of Health   Financial Resource Strain: Not on file  Food Insecurity: Not on file  Transportation Needs: Not on file  Physical Activity: Inactive (02/17/2020)   Exercise Vital Sign    Days of Exercise per Week: 0 days    Minutes of Exercise per Session: 0 min  Stress: Not on file  Social Connections: Not on file  Intimate Partner Violence: Not At Risk (02/17/2020)   Humiliation, Afraid, Rape, and Kick questionnaire    Fear of Current or Ex-Partner: No    Emotionally Abused: No    Physically Abused: No    Sexually Abused: No   Family History  Problem Relation Age of Onset   Cancer Mother    Arthritis Mother    Hypotension Father    Cancer Sister    Hypotension Sister    Colon cancer Neg Hx       VITAL SIGNS BP 108/83   Pulse 100   Temp 98.3 F (36.8 C)   Resp 20   Ht 5\' 5"  (1.651 m)   Wt 215 lb (97.5 kg)   SpO2 96%   BMI 35.78 kg/m   Outpatient Encounter Medications as of 05/19/2022  Medication Sig   acetaminophen (TYLENOL) 325 MG tablet Take 650 mg by mouth 2 (two) times daily.   ascorbic acid (VITAMIN C) 500 MG tablet Take 500 mg by mouth daily.   Calcium Carbonate (CALCIUM 600 PO) Take 1,500 mg by mouth daily.   ferrous sulfate 325 (65 FE) MG EC tablet Take 325 mg by mouth 2 (two) times a week. Monday and Thursday   latanoprost (XALATAN) 0.005 % ophthalmic solution Place 1 drop into both eyes at bedtime. wait 5 minutes between multiple eye drops   loratadine (CLARITIN) 10 MG tablet Take 10 mg by mouth daily.   mirabegron ER (MYRBETRIQ) 50 MG TB24 tablet Take 50 mg by mouth daily.   NON FORMULARY Diet: NAS,   omeprazole (PRILOSEC) 20 MG capsule Take 20 mg by mouth 2 (two) times daily before a meal. Special Instructions: *TAKE ON AN EMPTY STOMACH* FOR GERD *DO NOT CRUSH* (FORMULARY SUB FOR  PANTOPRAZOLE 40MG )   Vitamin D, Ergocalciferol, (DRISDOL) 1.25 MG (50000 UNIT) CAPS capsule Take 50,000 Units by mouth every 7 (seven) days.   [DISCONTINUED] guaiFENesin-dextromethorphan (ROBITUSSIN DM) 100-10 MG/5ML syrup Take 15 mLs by mouth every 6 (six) hours as needed for cough.   [DISCONTINUED] polyethylene glycol powder (GLYCOLAX/MIRALAX) 17 GM/SCOOP powder Take by mouth daily as needed.   [DISCONTINUED] vitamin D3 (CHOLECALCIFEROL) 25 MCG tablet Take 1,000 Units by mouth daily.   No facility-administered encounter medications on file as of 05/19/2022.     SIGNIFICANT DIAGNOSTIC EXAMS  PREVIOUS  03-08-22: dexa scan t score -  2.346   NO NEW EXAMS    LABS REVIEWED PREVIOUS;     04-25-21: d-dimer: 7.42   05-09-21: d-dimer: 5.60  05-19-21: d-dimer: 5.63 05-24-21; wbc 6.6; hgb 8.9; hct 30.4; mcv 81.1 plt 453 ;glucose 92; bun 16; creat 0.46; k+ 3.8; na++ 137; ca 8.5; GFR >60; liver normal albumin 3.1  05-27-21: guaiac negative 06-03-21: d-dimer: 6.31  06-20-21: wbc 6.3; hgb 9,3; hct 30.8; mcv 79.0 plt 431; tsh 1.144 hgb a1c 6.0  d-dimer 7.14 12/2; 06/29/21: guaiac +  07-04-21 d-dimer: 4.35 08-26-21; wbc 5.8; hgb 9.9; hct 32.9; mcv 77.4 plt 410; d-dimer 13.43; factor 5: 114 (n); factor 8: 233 (n) Fibrinogen 477 (ele) INR 1.4; folate 16.5; vitamin B 12: 314; ferritin 10; iron 18; tibc 361;  11-21-21: d-dimer: >20 01-04-22: wbc 5.4; hgb 11.7; hct 40.9; mcv 80.2 plt 280; glucose 88 bun 14; creat 0.63; k+ 4.8; na++ 134; ca 8.8; gfr>60; protein 7.9; albumin 3.6; iron 44; tibc 308; ferritin 90  01-12-22: hgb a1c 5.3; urine micro-albumin 4.8  NO NEW LABS.     Review of Systems  Constitutional:  Negative for malaise/fatigue.  Respiratory:  Negative for cough and shortness of breath.   Cardiovascular:  Negative for chest pain, palpitations and leg swelling.  Gastrointestinal:  Negative for abdominal pain, constipation and heartburn.  Musculoskeletal:  Negative for back pain, joint pain and  myalgias.  Skin: Negative.   Neurological:  Negative for dizziness.  Psychiatric/Behavioral:  The patient is not nervous/anxious.    Physical Exam Constitutional:      General: She is not in acute distress.    Appearance: She is well-developed. She is obese. She is not diaphoretic.  Neck:     Thyroid: No thyromegaly.  Cardiovascular:     Rate and Rhythm: Normal rate and regular rhythm.     Pulses: Normal pulses.     Heart sounds: Normal heart sounds.  Pulmonary:     Effort: Pulmonary effort is normal. No respiratory distress.     Breath sounds: Normal breath sounds.  Abdominal:     General: Bowel sounds are normal. There is no distension.     Palpations: Abdomen is soft.     Tenderness: There is no abdominal tenderness.  Musculoskeletal:     Cervical back: Neck supple.     Right lower leg: No edema.     Left lower leg: No edema.     Comments:  Is able to move all extremities History of right knee replacement with contracture     Lymphadenopathy:     Cervical: No cervical adenopathy.  Skin:    General: Skin is warm and dry.  Neurological:     Mental Status: She is alert. Mental status is at baseline.  Psychiatric:        Mood and Affect: Mood normal.      ASSESSMENT/ PLAN:  TODAY  Aortic atherosclerosis Essential benign hypertension Moderate vascular dementia without behavioral disturbance; psychotic disturbance; mood disturbance or anxiety   Will continue current medications Will continue current plan of care Will continue to monitor her status.    Time spent with patient: 40 minutes: medications; activities dietary    Synthia Innocent NP [facility]  call [phone]

## 2022-06-06 ENCOUNTER — Non-Acute Institutional Stay (SKILLED_NURSING_FACILITY): Payer: Medicare HMO | Admitting: Adult Health

## 2022-06-06 ENCOUNTER — Encounter: Payer: Self-pay | Admitting: Adult Health

## 2022-06-06 DIAGNOSIS — E876 Hypokalemia: Secondary | ICD-10-CM

## 2022-06-06 DIAGNOSIS — H40053 Ocular hypertension, bilateral: Secondary | ICD-10-CM

## 2022-06-06 DIAGNOSIS — M1711 Unilateral primary osteoarthritis, right knee: Secondary | ICD-10-CM

## 2022-06-06 NOTE — Progress Notes (Unsigned)
Location:  [facility] Room Number: 105-D Place of Service:  SNF (31)   CODE STATUS: Full Code  No Known Allergies  Chief Complaint  Patient presents with   Medical Management of Chronic Issues                                Hypokalemia:  Increased ocular pressure bilateral:  Primary osteoarthritis right knee and left index finger;    HPI:  She is a 87 year old long term resident of this facility being seen for the management of her chronic illnesses:   Hypokalemia:  Increased ocular pressure bilateral:  Primary osteoarthritis right knee and left index finger. There are no reports of uncontrolled pain. Her weight is stable at 221 pounds; she participates in activities.   Past Medical History:  Diagnosis Date   Abnormal posture    Per [employer], [facility] Electronic [facility] Records [facility]    Anemia    Cognitive communication deficit    Per [employer], [facility] Records [facility]    Contracture of left knee    Per [employer], [facility] Records [facility]    Diabetes mellitus without complication (HCC)    Difficulty walking    Per [employer], [facility] Records [facility]    Extended spectrum beta lactamase (ESBL) resistance    Per [facility], [facility] Records [facility]    Gastroesophageal reflux disease    Per [employer], [facility] Records [facility]    Glaucoma    Gram negative sepsis (HCC)    Per [facility], [facility] Records [facility]    Hypercholesteremia    Hypertension    Hypokalemia    Infection, bacterial    Per [employer], [facility] Records [facility]    Iron deficiency anemia due to chronic blood loss 11/23/2021   Muscle weakness (generalized)    Per [employer], [facility] Records [facility]    Need for assistance with personal care    Per [employer],  [facility] Records [facility]    OAB (overactive bladder)    Per [facility], [facility] Records [facility]    Ocular hypertension, bilateral    Per [facility], [facility] Records [facility]    Osteoarthritis of right knee    Per [employer], [facility] Records [facility]    Paraplegia [facility])    Pyelonephritis, acute    Per [employer], [facility] Records [facility]    Status post total right knee replacement    Unspecified dementia without behavioral disturbance    Per [facility], [facility] Records [facility]    Urgency of urination    Per [employer], [facility] Records [facility]    Wheelchair dependence    Per Ria Bush, [facility] Electronic [facility] Records [facility]     Past Surgical History:  Procedure Laterality Date   ABDOMINAL HYSTERECTOMY     CATARACT EXTRACTION     COLONOSCOPY N/A 12/10/2013   West Bali, MD ,Gi revealed redundant colon   TOTAL KNEE ARTHROPLASTY Right 03/30/2016   Procedure: TOTAL KNEE ARTHROPLASTY;  Surgeon: Vickki Hearing, MD;  Location: [facility];  Service: Orthopedics;  Laterality: Right;    Social History   Socioeconomic History   Marital status:  Divorced    Spouse name: Not on file   Number of children: Not on file   Years of education: Not on file   Highest education level: Not on file  Occupational History   Occupation: retired   Tobacco Use   Smoking status: Former    Packs/day: 0.25    Years: 3.00    Total pack years: 0.75    Types: Cigarettes   Smokeless tobacco: Never  Vaping Use   Vaping Use: Never used  Substance and Sexual Activity   Alcohol use: No   Drug use: No   Sexual activity: Not Currently    Birth control/protection: Post-menopausal  Other Topics Concern   Not on file  Social History Narrative   Long term resident of [facility]    Social Determinants of  Health   Financial Resource Strain: Not on file  Food Insecurity: Not on file  Transportation Needs: Not on file  Physical Activity: Inactive (02/17/2020)   Exercise Vital Sign    Days of Exercise per Week: 0 days    Minutes of Exercise per Session: 0 min  Stress: Not on file  Social Connections: Not on file  Intimate Partner Violence: Not At Risk (02/17/2020)   Humiliation, Afraid, Rape, and Kick questionnaire    Fear of Current or Ex-Partner: No    Emotionally Abused: No    Physically Abused: No    Sexually Abused: No   Family History  Problem Relation Age of Onset   Cancer Mother    Arthritis Mother    Hypotension Father    Cancer Sister    Hypotension Sister    Colon cancer Neg Hx       VITAL SIGNS BP 124/74   Pulse 74   Temp 97.8 F (36.6 C)   Resp 20   Ht 5\' 5"  (1.651 m)   Wt 221 lb 3.2 oz (100.3 kg)   SpO2 97%   BMI 36.81 kg/m   Outpatient Encounter Medications as of 06/06/2022  Medication Sig   acetaminophen (TYLENOL) 325 MG tablet Take 650 mg by mouth 2 (two) times daily.   ascorbic acid (VITAMIN C) 500 MG tablet Take 500 mg by mouth daily.   Calcium Carbonate (CALCIUM 600 PO) Take 1,500 mg by mouth daily.   ferrous sulfate 325 (65 FE) MG EC tablet Take 325 mg by mouth 2 (two) times a week. Monday and Thursday   latanoprost (XALATAN) 0.005 % ophthalmic solution Place 1 drop into both eyes at bedtime. wait 5 minutes between multiple eye drops   loratadine (CLARITIN) 10 MG tablet Take 10 mg by mouth daily.   mirabegron ER (MYRBETRIQ) 50 MG TB24 tablet Take 50 mg by mouth daily.   NON FORMULARY Diet: NAS,   omeprazole (PRILOSEC) 20 MG capsule Take 20 mg by mouth 2 (two) times daily before a meal. Special Instructions: *TAKE ON AN EMPTY STOMACH* FOR GERD *DO NOT CRUSH* (FORMULARY SUB FOR PANTOPRAZOLE 40MG )   Vitamin D, Ergocalciferol, (DRISDOL) 1.25 MG (50000 UNIT) CAPS capsule Take 50,000 Units by mouth every 7 (seven) days.   No facility-administered  encounter medications on file as of 06/06/2022.     SIGNIFICANT DIAGNOSTIC EXAMS   PREVIOUS  03-08-22: dexa scan t score -2.346   NO NEW EXAMS    LABS REVIEWED PREVIOUS;     05-24-21; wbc 6.6; hgb 8.9; hct 30.4; mcv 81.1 plt 453 ;glucose 92; bun 16; creat 0.46; k+ 3.8; na++ 137; ca 8.5; GFR >60; liver normal albumin 3.1  05-27-21: guaiac negative 06-03-21: d-dimer: 6.31  06-20-21: wbc 6.3; hgb 9,3; hct 30.8; mcv 79.0 plt 431; tsh 1.144 hgb a1c 6.0  d-dimer 7.14 12/2; 06/29/21: guaiac +  07-04-21 d-dimer: 4.35 08-26-21; wbc 5.8; hgb 9.9; hct 32.9; mcv 77.4 plt 410; d-dimer 13.43; factor 5: 114 (n); factor 8: 233 (n) Fibrinogen 477 (ele) INR 1.4; folate 16.5; vitamin B 12: 314; ferritin 10; iron 18; tibc 361;  11-21-21: d-dimer: >20 01-04-22: wbc 5.4; hgb 11.7; hct 40.9; mcv 80.2 plt 280; glucose 88 bun 14; creat 0.63; k+ 4.8; na++ 134; ca 8.8; gfr>60; protein 7.9; albumin 3.6; iron 44; tibc 308; ferritin 90  01-12-22: hgb a1c 5.3; urine micro-albumin 4.8  TODAY  04-06-22: wbc 5.3; hgb 12.4; hct 39.7; mcv 82.9 plt 363; glucose 97; bun 20; creat 0.59; k+ 4.1; na++ 136; ca 8.8; gfr >60; protein 7.0 albumin 3.2; hgb A1c 6.0; vitamin D: 19.98; urine micro-albumin <3.0 04-13-22; wbc 8.0; hgb 12.8; hct 41.7; mcv 85.1 plt 319; iron 91; tibc 266; ferritin 203; vitamin D 19.98    Review of Systems  Constitutional:  Negative for malaise/fatigue.  Respiratory:  Negative for cough and shortness of breath.   Cardiovascular:  Negative for chest pain, palpitations and leg swelling.  Gastrointestinal:  Negative for abdominal pain, constipation and heartburn.  Musculoskeletal:  Negative for back pain, joint pain and myalgias.  Skin: Negative.   Neurological:  Negative for dizziness.  Psychiatric/Behavioral:  The patient is not nervous/anxious.    Physical Exam Constitutional:      General: She is not in acute distress.    Appearance: She is well-developed. She is obese. She is not diaphoretic.  Neck:      Thyroid: No thyromegaly.  Cardiovascular:     Rate and Rhythm: Normal rate and regular rhythm.     Pulses: Normal pulses.     Heart sounds: Normal heart sounds.  Pulmonary:     Effort: Pulmonary effort is normal. No respiratory distress.     Breath sounds: Normal breath sounds.  Abdominal:     General: Bowel sounds are normal. There is no distension.     Palpations: Abdomen is soft.     Tenderness: There is no abdominal tenderness.  Musculoskeletal:     Cervical back: Neck supple.     Right lower leg: No edema.     Left lower leg: No edema.     Comments:  Is able to move all extremities History of right knee replacement with contracture     Lymphadenopathy:     Cervical: No cervical adenopathy.  Skin:    General: Skin is warm and dry.  Neurological:     Mental Status: She is alert. Mental status is at baseline.  Psychiatric:        Mood and Affect: Mood normal.       ASSESSMENT/ PLAN:  TODAY;   Hypokalemia: k+ 4.1; will monitor  2. Increased ocular pressure bilateral: will continue xalatan to both eyes  3. Primary osteoarthritis right knee and left index finger; will continue tylenol 650 mg twice daily   PREVIOUS   4. Unspecified protein calorie malnutrition: albumin 3.2 protein 7.0  5. Essential hypertension: b/p 124/74 will continue asa 81 mg daily   6. Chronic anemia: hgb 11.7 will continue iron twice weekly   7. GERD without esophagitis: will continue prilosec 20 mg twice daily   8. Vitamin D deficiency: level 19.98 will continue vitamin D 50,000 units weekly   9. Urinary urgency will continue  myrbetric 50 mg daily  10. Vascular dementia without behavioral disturbance: weight is 221 pounds;   11. Aortic atherosclerosis (ct 09-03-19)    Synthia Innocent NP [facility]  call [phone]

## 2022-07-03 ENCOUNTER — Encounter: Payer: Self-pay | Admitting: Adult Health

## 2022-07-03 ENCOUNTER — Non-Acute Institutional Stay (SKILLED_NURSING_FACILITY): Payer: Medicare HMO | Admitting: Adult Health

## 2022-07-03 DIAGNOSIS — D500 Iron deficiency anemia secondary to blood loss (chronic): Secondary | ICD-10-CM | POA: Diagnosis not present

## 2022-07-03 DIAGNOSIS — E441 Mild protein-calorie malnutrition: Secondary | ICD-10-CM

## 2022-07-03 DIAGNOSIS — I10 Essential (primary) hypertension: Secondary | ICD-10-CM | POA: Diagnosis not present

## 2022-07-03 NOTE — Progress Notes (Signed)
Location:  [facility] Room Number: NO/105/D Place of Service:  SNF (31) Anna Hanna S.,NP  CODE STATUS: FULL  No Known Allergies  Chief Complaint  Patient presents with   Medical Management of Chronic Issues                                Unspecified protein calorie malnutrition:  Essential hypertension:  Chronic anemia:    HPI:  She is a 87 year old long term resident of this facility being seen for the management of her chronic illnesses: Unspecified protein calorie malnutrition:  Essential hypertension:  Chronic anemia. There are no reports of uncontrolled pain. Her weight is stable at 222 pounds. She continues to socialize with others.   Past Medical History:  Diagnosis Date   Abnormal posture    Per [facility], [facility] Records [facility]    Anemia    Cognitive communication deficit    Per [facility], [facility]    Contracture of left knee    Per [facility], [facility]    Diabetes mellitus without complication (HCC)    Difficulty walking    Per [facility], [facility]    Extended spectrum beta lactamase (ESBL) resistance    Per [facility], [facility]    Gastroesophageal reflux disease    Per [facility], [facility]    Glaucoma    Gram negative sepsis (HCC)    Per [facility], [facility] Records [facility]    Hypercholesteremia    Hypertension    Hypokalemia    Infection, bacterial    Per [facility], [facility] Records [facility]    Iron deficiency anemia due to chronic blood loss 11/23/2021   Muscle weakness (generalized)    Per [facility], [facility] Records [facility]    Need for assistance with personal care    Per [facility], [facility]  [facility] Records [facility]    OAB (overactive bladder)    Per [facility], [facility]    Ocular hypertension, bilateral    Per [facility], [facility]    Osteoarthritis of right knee    Per [facility], [facility]    Paraplegia [facility])    Pyelonephritis, acute    Per [facility], [facility] Records [facility]    Status post total right knee replacement    Unspecified dementia without behavioral disturbance    Per [facility], [facility] Records [facility]    Urgency of urination    Per [facility], [facility]    Wheelchair dependence    Per Ria Bush, [facility] Records [facility]     Past Surgical History:  Procedure Laterality Date   ABDOMINAL HYSTERECTOMY     CATARACT EXTRACTION     COLONOSCOPY N/A 12/10/2013   West Bali, MD ,Gi revealed redundant colon   TOTAL KNEE ARTHROPLASTY Right 03/30/2016   Procedure: TOTAL KNEE ARTHROPLASTY;  Surgeon: Vickki Hearing, MD;  Location: [facility];  Service: Orthopedics;  Laterality: Right;    Social History   Socioeconomic History   Marital status: Divorced    Spouse name: Not on file  Number of children: Not on file   Years of education: Not on file   Highest education level: Not on file  Occupational History   Occupation: retired   Tobacco Use   Smoking status: Former    Packs/day: 0.25    Years: 3.00    Total pack years: 0.75    Types: Cigarettes   Smokeless tobacco: Never  Vaping Use   Vaping Use: Never used  Substance and Sexual Activity   Alcohol use: No   Drug use: No   Sexual activity: Not Currently    Birth control/protection: Post-menopausal  Other Topics Concern   Not on file  Social History Narrative   Long term resident of [facility]    Social Determinants of Health   Financial  Resource Strain: Not on file  Food Insecurity: Not on file  Transportation Needs: Not on file  Physical Activity: Inactive (02/17/2020)   Exercise Vital Sign    Days of Exercise per Week: 0 days    Minutes of Exercise per Session: 0 min  Stress: Not on file  Social Connections: Not on file  Intimate Partner Violence: Not At Risk (02/17/2020)   Humiliation, Afraid, Rape, and Kick questionnaire    Fear of Current or Ex-Partner: No    Emotionally Abused: No    Physically Abused: No    Sexually Abused: No   Family History  Problem Relation Age of Onset   Cancer Mother    Arthritis Mother    Hypotension Father    Cancer Sister    Hypotension Sister    Colon cancer Neg Hx       VITAL SIGNS BP (!) 129/59   Pulse 78   Temp 98.6 F (37 C)   Resp (!) 22   Ht 5\' 5"  (1.651 m)   Wt 222 lb (100.7 kg)   SpO2 98%   BMI 36.94 kg/m   Outpatient Encounter Medications as of 07/03/2022  Medication Sig   acetaminophen (TYLENOL) 325 MG tablet Take 650 mg by mouth 2 (two) times daily.   ascorbic acid (VITAMIN C) 500 MG tablet Take 500 mg by mouth daily.   Calcium Carbonate (CALCIUM 600 PO) Take 1,500 mg by mouth daily.   ferrous sulfate 325 (65 FE) MG EC tablet Take 325 mg by mouth 2 (two) times a week. Monday and Thursday   latanoprost (XALATAN) 0.005 % ophthalmic solution Place 1 drop into both eyes at bedtime. wait 5 minutes between multiple eye drops   loratadine (CLARITIN) 10 MG tablet Take 10 mg by mouth daily.   mirabegron ER (MYRBETRIQ) 50 MG TB24 tablet Take 50 mg by mouth daily.   NON FORMULARY Diet: NAS,   omeprazole (PRILOSEC) 20 MG capsule Take 20 mg by mouth 2 (two) times daily before a meal. Special Instructions: *TAKE ON AN EMPTY STOMACH* FOR GERD *DO NOT CRUSH* (FORMULARY SUB FOR PANTOPRAZOLE 40MG )   Vitamin D, Ergocalciferol, (DRISDOL) 1.25 MG (50000 UNIT) CAPS capsule Take 50,000 Units by mouth every 7 (seven) days.   No facility-administered encounter medications on  file as of 07/03/2022.     SIGNIFICANT DIAGNOSTIC EXAMS  PREVIOUS  03-08-22: dexa scan t score -2.346   NO NEW EXAMS    LABS REVIEWED PREVIOUS;     12/2; 06/29/21: guaiac +  07-04-21 d-dimer: 4.35 08-26-21; wbc 5.8; hgb 9.9; hct 32.9; mcv 77.4 plt 410; d-dimer 13.43; factor 5: 114 (n); factor 8: 233 (n) Fibrinogen 477 (ele) INR 1.4; folate 16.5; vitamin B 12: 314; ferritin  10; iron 18; tibc 361;  11-21-21: d-dimer: >20 01-04-22: wbc 5.4; hgb 11.7; hct 40.9; mcv 80.2 plt 280; glucose 88 bun 14; creat 0.63; k+ 4.8; na++ 134; ca 8.8; gfr>60; protein 7.9; albumin 3.6; iron 44; tibc 308; ferritin 90  01-12-22: hgb a1c 5.3; urine micro-albumin 4.8 04-06-22: wbc 5.3; hgb 12.4; hct 39.7; mcv 82.9 plt 363; glucose 97; bun 20; creat 0.59; k+ 4.1; na++ 136; ca 8.8; gfr >60; protein 7.0 albumin 3.2; hgb A1c 6.0; vitamin D: 19.98; urine micro-albumin <3.0 04-13-22; wbc 8.0; hgb 12.8; hct 41.7; mcv 85.1 plt 319; iron 91; tibc 266; ferritin 203; vitamin D 19.98   NO NEW LABS.    Review of Systems  Constitutional:  Negative for malaise/fatigue.  Respiratory:  Negative for cough and shortness of breath.   Cardiovascular:  Negative for chest pain, palpitations and leg swelling.  Gastrointestinal:  Negative for abdominal pain, constipation and heartburn.  Musculoskeletal:  Negative for back pain, joint pain and myalgias.  Skin: Negative.   Neurological:  Negative for dizziness.  Psychiatric/Behavioral:  The patient is not nervous/anxious.    Physical Exam Constitutional:      General: She is not in acute distress.    Appearance: She is well-developed. She is obese. She is not diaphoretic.  Neck:     Thyroid: No thyromegaly.  Cardiovascular:     Rate and Rhythm: Normal rate and regular rhythm.     Pulses: Normal pulses.     Heart sounds: Normal heart sounds.  Pulmonary:     Effort: Pulmonary effort is normal. No respiratory distress.     Breath sounds: Normal breath sounds.  Abdominal:      General: Bowel sounds are normal. There is no distension.     Palpations: Abdomen is soft.     Tenderness: There is no abdominal tenderness.  Musculoskeletal:     Cervical back: Neck supple.     Right lower leg: No edema.     Left lower leg: No edema.     Comments:  Is able to move all extremities History of right knee replacement with contracture    Lymphadenopathy:     Cervical: No cervical adenopathy.  Skin:    General: Skin is warm and dry.  Neurological:     Mental Status: She is alert. Mental status is at baseline.  Psychiatric:        Mood and Affect: Mood normal.         ASSESSMENT/ PLAN:  TODAY;   Unspecified protein calorie malnutrition: albumin 3.2; protein 7.0  2. Essential hypertension: b/p 129/59 will continue asa 81 mg daily   3. Chronic anemia: hgb 11.7; will continue iron twice weekly    PREVIOUS   4. GERD without esophagitis: will continue prilosec 20 mg twice daily   5. Vitamin D deficiency: level 19.98 will continue vitamin D 50,000 units weekly will check phosphate level   6. Urinary urgency: she is completely incontinent of urine will stop myrbetriq  7. Vascular dementia without behavioral disturbance: weight is 222  pounds;   8. Aortic atherosclerosis (ct 09-03-19)   9. Hypokalemia: k+ 4.1; will monitor  10. Increased ocular pressure bilateral: will continue xalatan to both eyes  11. Primary osteoarthritis right knee and left index finger; will continue tylenol 650 mg twice daily       Anna Innocent NP [facility]  call [phone]

## 2022-07-06 ENCOUNTER — Other Ambulatory Visit (HOSPITAL_COMMUNITY)
Admission: RE | Admit: 2022-07-06 | Discharge: 2022-07-06 | Disposition: A | Discharge status: Home / Self Care | Payer: Medicare HMO | Source: Skilled Nursing Facility | Attending: Adult Health | Admitting: Adult Health

## 2022-07-06 DIAGNOSIS — E1122 Type 2 diabetes mellitus with diabetic chronic kidney disease: Secondary | ICD-10-CM | POA: Insufficient documentation

## 2022-07-07 LAB — MICROALBUMIN / CREATININE URINE RATIO
Creatinine, Urine: 61.3 mg/dL
Microalb Creat Ratio: 25 mg/g creat (ref 0–29)
Microalb, Ur: 15.6 ug/mL — ABNORMAL HIGH

## 2022-07-25 ENCOUNTER — Non-Acute Institutional Stay (SKILLED_NURSING_FACILITY): Payer: Medicare HMO | Admitting: Internal Medicine

## 2022-07-25 ENCOUNTER — Encounter: Payer: Self-pay | Admitting: Internal Medicine

## 2022-07-25 DIAGNOSIS — I129 Hypertensive chronic kidney disease with stage 1 through stage 4 chronic kidney disease, or unspecified chronic kidney disease: Secondary | ICD-10-CM

## 2022-07-25 DIAGNOSIS — E1322 Other specified diabetes mellitus with diabetic chronic kidney disease: Secondary | ICD-10-CM

## 2022-07-25 DIAGNOSIS — E559 Vitamin D deficiency, unspecified: Secondary | ICD-10-CM | POA: Diagnosis not present

## 2022-07-25 DIAGNOSIS — J301 Allergic rhinitis due to pollen: Secondary | ICD-10-CM

## 2022-07-25 DIAGNOSIS — D500 Iron deficiency anemia secondary to blood loss (chronic): Secondary | ICD-10-CM | POA: Diagnosis not present

## 2022-07-25 DIAGNOSIS — N182 Chronic kidney disease, stage 2 (mild): Secondary | ICD-10-CM

## 2022-07-25 DIAGNOSIS — I10 Essential (primary) hypertension: Secondary | ICD-10-CM

## 2022-07-25 NOTE — Assessment & Plan Note (Signed)
She describes pruritus of the upper extremities and trunk and is concerned about subcutaneous "bumps."  No skin lesions are visible.  Urticaria cannot be initiated by scratching the skin.  I recommended that she ask for the loratadine if she is having such symptoms.

## 2022-07-25 NOTE — Assessment & Plan Note (Signed)
Blood pressure remains normal without antihypertensive medications.  No change indicated.

## 2022-07-25 NOTE — Assessment & Plan Note (Signed)
Current CBC and iron panel are normal.  Anemia resolved.  Continue to monitor.

## 2022-07-25 NOTE — Progress Notes (Unsigned)
NURSING HOME LOCATION:  [facility] ROOM NUMBER:  105 D  CODE STATUS:  Full Code  PCP:  Ok Edwards NP  This is a nursing facility follow up visit of chronic medical diagnoses & to document compliance with Regulation 483.30 (c) in [employer] Manual Phase 2 which mandates caregiver visit ( visits can alternate among physician, PA or NP as per statutes) within 10 days of 30 days / 60 days/ 90 days post admission to SNF date    Interim medical record and care since last SNF visit was updated with review of diagnostic studies and change in clinical status since last visit were documented.  HPI: She is a permanent resident of this facility with medical diagnoses of GERD, glaucoma, dyslipidemia, essential hypertension, iron deficiency anemia, OAB, diabetes with CKD stage II, dementia, and history of paraplegia.  Most recent labs were performed in September.  Calcium was 8.8 and albumin 3.2; but total protein was normal at 7.  Iron panel was normal.  Vitamin D at that time was 19.98 with normals of 30-100.  She is on high-dose vitamin D supplementation at 50,000 units weekly.  A1C was prediabetic at 6%.  Review of systems: Dementia invalidated responses. Date given as July 27, 2021.  She was concerned that she had been wheezing for 2 days with a nonproductive cough.  She stated "I have no cold" denying any constitutional symptoms or symptoms of upper or lower respiratory tract infection.  She is also concerned that she has itching over the right arm and right thorax which she attributes to the washing powder.  She believes that she has bumps under the skin over the upper anterior thorax.  She describes discomfort due to arthritis in her hands and knees.  She states that she does "pee a lot" when she gets anxious.  She made the statement "I want to go home."  Constitutional: No fever, significant weight change, fatigue  Eyes: No redness, discharge, pain, vision  change ENT/mouth: No nasal congestion,  purulent discharge, earache, change in hearing, sore throat  Cardiovascular: No chest pain, palpitations, paroxysmal nocturnal dyspnea, claudication, edema  Respiratory: No cough, sputum production, hemoptysis, DOE, significant snoring, apnea   Gastrointestinal: No heartburn, dysphagia, abdominal pain, nausea /vomiting, rectal bleeding, melena, change in bowels Genitourinary: No dysuria, hematuria, pyuria, incontinence, nocturia Musculoskeletal: No joint stiffness, joint swelling, weakness, pain Dermatologic: No rash, pruritus, change in appearance of skin Neurologic: No dizziness, headache, syncope, seizures, numbness, tingling Psychiatric: No significant anxiety, depression, insomnia, anorexia Endocrine: No change in hair/skin/nails, excessive thirst, excessive hunger, excessive urination  Hematologic/lymphatic: No significant bruising, lymphadenopathy, abnormal bleeding Allergy/immunology: No itchy/watery eyes, significant sneezing, urticaria, angioedema  Physical exam:  Pertinent or positive findings: Arcus senilis is present.  There is minimal erythema of the left nasal septum without exudate or purulence.  She is edentulous.  Slight tachycardia is present with decreased heart sounds.  Breath sounds are also decreased.  She has minimal insignificant rales at the right lower lobe.  No wheezing was noted.  Abdomen is protuberant.  Thighs are large.  She has 1/2+ edema at the right sock line and trace on the left.  Bevelyn Buckles' sign is negative.  Despite a symmetry in the lower extremity edema.  Pedal pulses are decreased.  Fusiform changes of the knees are present, greater on the right.  She has flexion contractures of the lower left fingers and right fifth finger.  There are some PIP enlargement of the  digits of the left hand.  No skin lesions were visible and I could not elicit urticaria by scratching the skin.  General appearance: Adequately nourished; no  acute distress, increased work of breathing is present.   Lymphatic: No lymphadenopathy about the head, neck, axilla. Eyes: No conjunctival inflammation or lid edema is present. There is no scleral icterus. Ears:  External ear exam shows no significant lesions or deformities.   Nose:  External nasal examination shows no deformity or inflammation. Nasal mucosa are pink and moist without lesions, exudates Oral exam:  Lips and gums are healthy appearing. There is no oropharyngeal erythema or exudate. Neck:  No thyromegaly, masses, tenderness noted.    Heart:  Normal rate and regular rhythm. S1 and S2 normal without gallop, murmur, click, rub .  Lungs: Chest clear to auscultation without wheezes, rhonchi, rales, rubs. Abdomen: Bowel sounds are normal. Abdomen is soft and nontender with no organomegaly, hernias, masses. GU: Deferred  Extremities:  No cyanosis, clubbing, edema  Neurologic exam : Cn 2-7 intact Strength equal  in upper & lower extremities Balance, Rhomberg, finger to nose testing could not be completed due to clinical state Deep tendon reflexes are equal Skin: Warm & dry w/o tenting. No significant lesions or rash.  See summary under each active problem in the Problem List with associated updated therapeutic plan

## 2022-07-25 NOTE — Patient Instructions (Signed)
See assessment and plan under each diagnosis in the problem list and acutely for this visit 

## 2022-07-25 NOTE — Assessment & Plan Note (Signed)
She has been on high-dose vitamin D supplementation since 05/19/2022; vitamin D recheck indicated.

## 2022-07-25 NOTE — Assessment & Plan Note (Signed)
Blood pressure adequately controlled without medication.  A1C is prediabetic at 6%.  No change indicated.

## 2022-08-11 ENCOUNTER — Encounter: Payer: Self-pay | Admitting: Adult Health

## 2022-08-11 ENCOUNTER — Non-Acute Institutional Stay (SKILLED_NURSING_FACILITY): Payer: Medicare HMO | Admitting: Adult Health

## 2022-08-11 ENCOUNTER — Other Ambulatory Visit (HOSPITAL_COMMUNITY)
Admission: RE | Admit: 2022-08-11 | Discharge: 2022-08-11 | Disposition: A | Discharge status: Home / Self Care | Payer: Medicare HMO | Source: Skilled Nursing Facility | Attending: Adult Health | Admitting: Adult Health

## 2022-08-11 DIAGNOSIS — D649 Anemia, unspecified: Secondary | ICD-10-CM | POA: Diagnosis not present

## 2022-08-11 DIAGNOSIS — D6869 Other thrombophilia: Secondary | ICD-10-CM

## 2022-08-11 DIAGNOSIS — E1122 Type 2 diabetes mellitus with diabetic chronic kidney disease: Secondary | ICD-10-CM | POA: Diagnosis present

## 2022-08-11 DIAGNOSIS — I700 Atherosclerosis of aorta: Secondary | ICD-10-CM | POA: Diagnosis not present

## 2022-08-11 DIAGNOSIS — U071 COVID-19: Secondary | ICD-10-CM

## 2022-08-11 LAB — BASIC METABOLIC PANEL
Anion gap: 8 (ref 5–15)
BUN: 17 mg/dL (ref 8–23)
CO2: 29 mmol/L (ref 22–32)
Calcium: 8.6 mg/dL — ABNORMAL LOW (ref 8.9–10.3)
Chloride: 101 mmol/L (ref 98–111)
Creatinine, Ser: 0.62 mg/dL (ref 0.44–1.00)
GFR, Estimated: >60 mL/min (ref >60)
Glucose, Bld: 86 mg/dL (ref 70–99)
Potassium: 4.1 mmol/L (ref 3.5–5.1)
Sodium: 138 mmol/L (ref 135–145)

## 2022-08-11 LAB — CBC
HCT: 37.8 % (ref 36.0–46.0)
Hemoglobin: 11.7 g/dL — ABNORMAL LOW (ref 12.0–15.0)
MCH: 27.3 pg (ref 26.0–34.0)
MCHC: 31 g/dL (ref 30.0–36.0)
MCV: 88.3 fL (ref 80.0–100.0)
Platelets: 316 10*3/uL (ref 150–400)
RBC: 4.28 MIL/uL (ref 3.87–5.11)
RDW: 13.6 % (ref 11.5–15.5)
WBC: 5.7 10*3/uL (ref 4.0–10.5)
nRBC: 0 % (ref 0.0–0.2)

## 2022-08-11 NOTE — Progress Notes (Signed)
Location:  [facility] Room Number: NO/105/D Place of Service:  SNF (31) Ok Edwards S.,NP  CODE STATUS: FULL  No Known Allergies  Chief Complaint  Patient presents with   Acute Visit    Patient is being seen for care plan meeting    HPI:  We have come together for her care plan meeting. BIMS 14/15 mood 5/30: not sleeping well; nervous at times. She is nonambulatory with no falls. She requires max to dependent assist with her adls. She is incontinent of bladder and bowel. Dietary: weight is 230 pounds regular diet 75-100% appetite; feeds self. Therapy: is on ST due to SLUMS of 7/30 severe; poor insight. Activities: she does participate. She continues to be followed for her chronic illnesses including:   Aortic atherosclerosis  Hypercoagulable  state associated with covid 19  Chronic anemia  Past Medical History:  Diagnosis Date   Abnormal posture    Per [facility], [facility] Records [facility]    Anemia    Cognitive communication deficit    Per [facility], [facility] Records [facility]    Contracture of left knee    Per [facility], [facility] Records [facility]    Diabetes mellitus without complication ([place])    Difficulty walking    Per [facility], [facility] Records [facility]    Extended spectrum beta lactamase (ESBL) resistance    Per [facility], [facility] Records [facility]    Gastroesophageal reflux disease    Per [facility], [facility] Records [facility]    Glaucoma    Gram negative sepsis ([place])    Per [facility], [facility] Records [facility]    Hypercholesteremia    Hypertension    Hypokalemia    Infection, bacterial    Per [facility], [facility] Records [facility]    Iron deficiency anemia due to chronic blood loss 11/23/2021   Muscle weakness (generalized)    Per  [facility], [facility] Records [facility]    Need for assistance with personal care    Per [facility], [facility] Records [facility]    OAB (overactive bladder)    Per [facility], [facility]    Ocular hypertension, bilateral    Per [facility], [facility]    Osteoarthritis of right knee    Per [facility], [facility]    Paraplegia [facility])    Pyelonephritis, acute    Per [facility], [facility] Records [facility]    Status post total right knee replacement    Unspecified dementia without behavioral disturbance    Per [facility], [facility] Records [facility]    Urgency of urination    Per [facility], [facility]    Wheelchair dependence    Per Zadie Cleverly, [facility] Records [facility]     Past Surgical History:  Procedure Laterality Date   ABDOMINAL HYSTERECTOMY     CATARACT EXTRACTION     COLONOSCOPY N/A 12/10/2013   Danie Binder, MD ,Gi revealed redundant colon   TOTAL KNEE ARTHROPLASTY Right 03/30/2016   Procedure: TOTAL KNEE ARTHROPLASTY;  Surgeon: Carole Civil, MD;  Location: [facility];  Service: Orthopedics;  Laterality: Right;    Social History   Socioeconomic History   Marital status: Divorced  Spouse name: Not on file   Number of children: Not on file   Years of education: Not on file   Highest education level: Not on file  Occupational History   Occupation: retired   Tobacco Use   Smoking status: Former    Packs/day: 0.25    Years: 3.00    Total pack years: 0.75    Types: Cigarettes   Smokeless tobacco: Never  Vaping Use   Vaping Use: Never used  Substance and Sexual Activity   Alcohol use: No   Drug use: No   Sexual activity: Not Currently    Birth control/protection: Post-menopausal   Other Topics Concern   Not on file  Social History Narrative   Long term resident of [facility]    Social Determinants of Health   Financial Resource Strain: Not on file  Food Insecurity: Not on file  Transportation Needs: Not on file  Physical Activity: Inactive (02/17/2020)   Exercise Vital Sign    Days of Exercise per Week: 0 days    Minutes of Exercise per Session: 0 min  Stress: Not on file  Social Connections: Not on file  Intimate Partner Violence: Not At Risk (02/17/2020)   Humiliation, Afraid, Rape, and Kick questionnaire    Fear of Current or Ex-Partner: No    Emotionally Abused: No    Physically Abused: No    Sexually Abused: No   Family History  Problem Relation Age of Onset   Cancer Mother    Arthritis Mother    Hypotension Father    Cancer Sister    Hypotension Sister    Colon cancer Neg Hx       VITAL SIGNS BP 104/78   Pulse 99   Temp 98.1 F (36.7 C)   Resp 20   Ht 5\' 5"  (1.651 m)   Wt 230 lb (104.3 kg)   SpO2 100%   BMI 38.27 kg/m   Outpatient Encounter Medications as of 08/11/2022  Medication Sig   acetaminophen (TYLENOL) 325 MG tablet Take 650 mg by mouth 2 (two) times daily.   ascorbic acid (VITAMIN C) 500 MG tablet Take 500 mg by mouth daily.   Calcium Carbonate (CALCIUM 600 PO) Take 1,500 mg by mouth daily.   ferrous sulfate 325 (65 FE) MG EC tablet Take 325 mg by mouth 2 (two) times a week. Monday and Thursday   latanoprost (XALATAN) 0.005 % ophthalmic solution Place 1 drop into both eyes at bedtime. wait 5 minutes between multiple eye drops   loratadine (CLARITIN) 10 MG tablet Take 10 mg by mouth daily.   NON FORMULARY Diet: NAS,   omeprazole (PRILOSEC) 20 MG capsule Take 20 mg by mouth 2 (two) times daily before a meal. Special Instructions: *TAKE ON AN EMPTY STOMACH* FOR GERD *DO NOT CRUSH* (FORMULARY SUB FOR PANTOPRAZOLE 40MG )   Vitamin D, Ergocalciferol, (DRISDOL) 1.25 MG (50000 UNIT) CAPS capsule Take 50,000 Units by mouth every 7 (seven)  days.   No facility-administered encounter medications on file as of 08/11/2022.     SIGNIFICANT DIAGNOSTIC EXAMS  PREVIOUS  03-08-22: dexa scan t score -2.346   NO NEW EXAMS    LABS REVIEWED PREVIOUS;     08-26-21; wbc 5.8; hgb 9.9; hct 32.9; mcv 77.4 plt 410; d-dimer 13.43; factor 5: 114 (n); factor 8: 233 (n) Fibrinogen 477 (ele) INR 1.4; folate 16.5; vitamin B 12: 314; ferritin 10; iron 18; tibc 361;  11-21-21: d-dimer: >20 01-04-22: wbc 5.4; hgb 11.7; hct 40.9; mcv 80.2  plt 280; glucose 88 bun 14; creat 0.63; k+ 4.8; na++ 134; ca 8.8; gfr>60; protein 7.9; albumin 3.6; iron 44; tibc 308; ferritin 90  01-12-22: hgb a1c 5.3; urine micro-albumin 4.8 04-06-22: wbc 5.3; hgb 12.4; hct 39.7; mcv 82.9 plt 363; glucose 97; bun 20; creat 0.59; k+ 4.1; na++ 136; ca 8.8; gfr >60; protein 7.0 albumin 3.2; hgb A1c 6.0; vitamin D: 19.98; urine micro-albumin <3.0 04-13-22; wbc 8.0; hgb 12.8; hct 41.7; mcv 85.1 plt 319; iron 91; tibc 266; ferritin 203; vitamin D 19.98   NO NEW LABS.    Review of Systems  Constitutional:  Negative for malaise/fatigue.  Respiratory:  Negative for cough and shortness of breath.   Cardiovascular:  Negative for chest pain, palpitations and leg swelling.  Gastrointestinal:  Negative for abdominal pain, constipation and heartburn.  Musculoskeletal:  Negative for back pain, joint pain and myalgias.  Skin: Negative.   Neurological:  Negative for dizziness.  Psychiatric/Behavioral:  The patient is not nervous/anxious.    Physical Exam Constitutional:      General: She is not in acute distress.    Appearance: She is well-developed. She is obese. She is not diaphoretic.  Neck:     Thyroid: No thyromegaly.  Cardiovascular:     Rate and Rhythm: Normal rate and regular rhythm.     Pulses: Normal pulses.     Heart sounds: Normal heart sounds.  Pulmonary:     Effort: Pulmonary effort is normal. No respiratory distress.     Breath sounds: Normal breath sounds.  Abdominal:      General: Bowel sounds are normal. There is no distension.     Palpations: Abdomen is soft.     Tenderness: There is no abdominal tenderness.  Musculoskeletal:     Cervical back: Neck supple.     Right lower leg: No edema.     Left lower leg: No edema.     Comments:  Is able to move all extremities History of right knee replacement with contracture     Lymphadenopathy:     Cervical: No cervical adenopathy.  Skin:    General: Skin is warm and dry.  Neurological:     Mental Status: She is alert. Mental status is at baseline.  Psychiatric:        Mood and Affect: Mood normal.       ASSESSMENT/ PLAN:  TODAY  Aortic atherosclerosis Hypercoagulable  state associated with covid 19 Chronic anemia   Will continue current medications Will continue current plan of care Will continue to monitor her status   Time spent with patient: 40 minutes: medications; activities; goals of care.    Synthia Innocent NP [facility]   call [phone]

## 2022-08-29 ENCOUNTER — Non-Acute Institutional Stay (SKILLED_NURSING_FACILITY): Payer: Medicare HMO | Admitting: Adult Health

## 2022-08-29 ENCOUNTER — Encounter: Payer: Self-pay | Admitting: Adult Health

## 2022-08-29 DIAGNOSIS — K219 Gastro-esophageal reflux disease without esophagitis: Secondary | ICD-10-CM

## 2022-08-29 DIAGNOSIS — E559 Vitamin D deficiency, unspecified: Secondary | ICD-10-CM

## 2022-08-29 DIAGNOSIS — N3281 Overactive bladder: Secondary | ICD-10-CM

## 2022-08-29 NOTE — Progress Notes (Unsigned)
Location:  [place] Room Number: NO/105/D Place of Service:  SNF (31) Ok Edwards S.,NP  CODE STATUS: FULL  No Known Allergies  Chief Complaint  Patient presents with   Medical Management of Chronic Issues    Patient is here for a follow up for chronic conditions     HPI:    Past Medical History:  Diagnosis Date   Abnormal posture    Per [facility], [facility] Electronic [facility] Records [facility]    Anemia    Cognitive communication deficit    Per [facility], [facility]    Contracture of left knee    Per [facility], [facility]    Diabetes mellitus without complication ([place])    Difficulty walking    Per [facility], [facility]    Extended spectrum beta lactamase (ESBL) resistance    Per [facility], [facility]    Gastroesophageal reflux disease    Per [facility], [facility]    Glaucoma    Gram negative sepsis ([place])    Per [facility], [facility]    Hypercholesteremia    Hypertension    Hypokalemia    Infection, bacterial    Per [facility], [facility]    Iron deficiency anemia due to chronic blood loss 11/23/2021   Muscle weakness (generalized)    Per [facility], [facility]    Need for assistance with personal care    Per [facility], [facility]    OAB (overactive bladder)    Per [facility], [facility]    Ocular hypertension, bilateral    Per [facility], [facility]    Osteoarthritis of right knee    Per [facility], [facility]    Paraplegia [facility])     Pyelonephritis, acute    Per [facility], [facility]    Status post total right knee replacement    Unspecified dementia without behavioral disturbance    Per [facility], [facility] Records [facility]    Urgency of urination    Per [facility], [facility]    Wheelchair dependence    Per Zadie Cleverly, [facility] Records [facility]     Past Surgical History:  Procedure Laterality Date   ABDOMINAL HYSTERECTOMY     CATARACT EXTRACTION     COLONOSCOPY N/A 12/10/2013   Danie Binder, MD ,Gi revealed redundant colon   TOTAL KNEE ARTHROPLASTY Right 03/30/2016   Procedure: TOTAL KNEE ARTHROPLASTY;  Surgeon: Carole Civil, MD;  Location: [facility];  Service: Orthopedics;  Laterality: Right;    Social History   Socioeconomic History   Marital status: Divorced    Spouse name: Not on file   Number of children: Not on file   Years of education: Not on file   Highest education level: Not on file  Occupational History   Occupation: retired   Tobacco Use   Smoking status: Former    Packs/day: 0.25    Years: 3.00    Total pack years: 0.75    Types: Cigarettes   Smokeless tobacco: Never  Vaping Use   Vaping Use: Never used  Substance and  Sexual Activity   Alcohol use: No   Drug use: No   Sexual activity: Not Currently    Birth control/protection: Post-menopausal  Other Topics Concern   Not on file  Social History Narrative   Long term resident of [facility]    Social Determinants of Health   Financial Resource Strain: Not on file  Food Insecurity: Not on file  Transportation Needs: Not on file  Physical Activity: Inactive (02/17/2020)   Exercise Vital Sign    Days of Exercise per Week: 0 days    Minutes of Exercise per Session: 0 min  Stress: Not on file  Social Connections: Not on file  Intimate Partner Violence: Not At Risk (02/17/2020)   Humiliation,  Afraid, Rape, and Kick questionnaire    Fear of Current or Ex-Partner: No    Emotionally Abused: No    Physically Abused: No    Sexually Abused: No   Family History  Problem Relation Age of Onset   Cancer Mother    Arthritis Mother    Hypotension Father    Cancer Sister    Hypotension Sister    Colon cancer Neg Hx       VITAL SIGNS BP 120/84   Pulse 97   Temp (!) 97.5 F (36.4 C)   Resp 19   Ht 5\' 5"  (1.651 m)   Wt 225 lb 8 oz (102.3 kg)   SpO2 100%   BMI 37.53 kg/m   Outpatient Encounter Medications as of 08/29/2022  Medication Sig   acetaminophen (TYLENOL) 325 MG tablet Take 650 mg by mouth 2 (two) times daily.   ascorbic acid (VITAMIN C) 500 MG tablet Take 500 mg by mouth daily.   Calcium Carbonate (CALCIUM 600 PO) Take 1,500 mg by mouth daily.   ferrous sulfate 325 (65 FE) MG EC tablet Take 325 mg by mouth 2 (two) times a week. Monday and Thursday   latanoprost (XALATAN) 0.005 % ophthalmic solution Place 1 drop into both eyes at bedtime. wait 5 minutes between multiple eye drops   loratadine (CLARITIN) 10 MG tablet Take 10 mg by mouth daily.   NON FORMULARY Diet: NAS,   omeprazole (PRILOSEC) 20 MG capsule Take 20 mg by mouth 2 (two) times daily before a meal. Special Instructions: *TAKE ON AN EMPTY STOMACH* FOR GERD *DO NOT CRUSH* (FORMULARY SUB FOR PANTOPRAZOLE 40MG )   Vitamin D, Ergocalciferol, (DRISDOL) 1.25 MG (50000 UNIT) CAPS capsule Take 50,000 Units by mouth every 7 (seven) days.   No facility-administered encounter medications on file as of 08/29/2022.     SIGNIFICANT DIAGNOSTIC EXAMS       ASSESSMENT/ PLAN:     Ok Edwards NP [facility]  Contact [phone] Monday through Friday 8am- 5pm  After hours call [phone]

## 2022-09-25 ENCOUNTER — Non-Acute Institutional Stay (SKILLED_NURSING_FACILITY): Payer: Medicare HMO | Admitting: Adult Health

## 2022-09-25 ENCOUNTER — Encounter: Payer: Self-pay | Admitting: Adult Health

## 2022-09-25 DIAGNOSIS — E876 Hypokalemia: Secondary | ICD-10-CM | POA: Diagnosis not present

## 2022-09-25 DIAGNOSIS — I700 Atherosclerosis of aorta: Secondary | ICD-10-CM

## 2022-09-25 DIAGNOSIS — F01B0 Vascular dementia, moderate, without behavioral disturbance, psychotic disturbance, mood disturbance, and anxiety: Secondary | ICD-10-CM

## 2022-09-25 NOTE — Progress Notes (Unsigned)
Location:  [place] Room Number: 105 Place of Service:  SNF (31)   CODE STATUS: full   No Known Allergies  Chief Complaint  Patient presents with   Medical Management of Chronic Issues              Vascular dementia without behavioral disturbance: Aortic atherosclerosis  Hypokalemia     HPI:  She is a 88 year old long term resident of this facility being seen for the management of her chronic illnesses:  Vascular dementia without behavioral disturbance: Aortic atherosclerosis  Hypokalemia. There are no reports of uncontrolled pain. There are no reports of changes in appetite; no reports of insomnia present.   Past Medical History:  Diagnosis Date   Abnormal posture    Per [facility], [facility] Records [facility]    Anemia    Cognitive communication deficit    Per [facility], [facility]    Contracture of left knee    Per [facility], [facility]    Diabetes mellitus without complication ([place])    Difficulty walking    Per [facility], [facility]    Extended spectrum beta lactamase (ESBL) resistance    Per [facility], [facility] Records [facility]    Gastroesophageal reflux disease    Per [facility], [facility] Records [facility]    Glaucoma    Gram negative sepsis ([place])    Per [facility], [facility] Records [facility]    Hypercholesteremia    Hypertension    Hypokalemia    Infection, bacterial    Per [facility], [facility] Records [facility]    Iron deficiency anemia due to chronic blood loss 11/23/2021   Muscle weakness (generalized)    Per [facility], [facility] Records [facility]    Need for assistance with personal care    Per [facility], [facility]     OAB (overactive bladder)    Per [facility], [facility]    Ocular hypertension, bilateral    Per [facility], [facility]    Osteoarthritis of right knee    Per [facility], [facility]    Paraplegia [facility])    Pyelonephritis, acute    Per [facility], [facility] Records [facility]    Status post total right knee replacement    Unspecified dementia without behavioral disturbance    Per [facility], [facility] Records [facility]    Urgency of urination    Per [facility], [facility]    Wheelchair dependence    Per Zadie Cleverly, [facility] Records [facility]     Past Surgical History:  Procedure Laterality Date   ABDOMINAL HYSTERECTOMY     CATARACT EXTRACTION     COLONOSCOPY N/A 12/10/2013   Danie Binder, MD ,Gi revealed redundant colon   TOTAL KNEE ARTHROPLASTY Right 03/30/2016   Procedure: TOTAL KNEE ARTHROPLASTY;  Surgeon: Carole Civil, MD;  Location: [facility];  Service: Orthopedics;  Laterality: Right;    Social History   Socioeconomic History   Marital status: Divorced    Spouse name: Not on file   Number of children: Not on file   Years of education: Not on file  Highest education level: Not on file  Occupational History   Occupation: retired   Tobacco Use   Smoking status: Former    Packs/day: 0.25    Years: 3.00    Total pack years: 0.75    Types: Cigarettes   Smokeless tobacco: Never  Vaping Use   Vaping Use: Never used  Substance and Sexual Activity   Alcohol use: No   Drug use: No   Sexual activity: Not Currently    Birth control/protection: Post-menopausal  Other Topics Concern   Not on file  Social History Narrative   Long term resident of [facility]    Social Determinants of Health   Financial Resource Strain: Not on file  Food  Insecurity: Not on file  Transportation Needs: Not on file  Physical Activity: Inactive (02/17/2020)   Exercise Vital Sign    Days of Exercise per Week: 0 days    Minutes of Exercise per Session: 0 min  Stress: Not on file  Social Connections: Not on file  Intimate Partner Violence: Not At Risk (02/17/2020)   Humiliation, Afraid, Rape, and Kick questionnaire    Fear of Current or Ex-Partner: No    Emotionally Abused: No    Physically Abused: No    Sexually Abused: No   Family History  Problem Relation Age of Onset   Cancer Mother    Arthritis Mother    Hypotension Father    Cancer Sister    Hypotension Sister    Colon cancer Neg Hx       VITAL SIGNS BP 111/62   Pulse 76   Temp 98 F (36.7 C)   Resp 18   Ht '5\' 5"'$  (1.651 m)   Wt 236 lb 3.2 oz (107.1 kg)   SpO2 95%   BMI 39.31 kg/m   Outpatient Encounter Medications as of 09/25/2022  Medication Sig   acetaminophen (TYLENOL) 325 MG tablet Take 650 mg by mouth 2 (two) times daily.   ascorbic acid (VITAMIN C) 500 MG tablet Take 500 mg by mouth daily.   Calcium Carbonate (CALCIUM 600 PO) Take 1,500 mg by mouth daily.   ferrous sulfate 325 (65 FE) MG EC tablet Take 325 mg by mouth 2 (two) times a week. Monday and Thursday   latanoprost (XALATAN) 0.005 % ophthalmic solution Place 1 drop into both eyes at bedtime. wait 5 minutes between multiple eye drops   loratadine (CLARITIN) 10 MG tablet Take 10 mg by mouth daily.   NON FORMULARY Diet: NAS,   omeprazole (PRILOSEC) 20 MG capsule Take 20 mg by mouth 2 (two) times daily before a meal. Special Instructions: *TAKE ON AN EMPTY STOMACH* FOR GERD *DO NOT CRUSH* (FORMULARY SUB FOR PANTOPRAZOLE '40MG'$ )   Vitamin D, Ergocalciferol, (DRISDOL) 1.25 MG (50000 UNIT) CAPS capsule Take 50,000 Units by mouth every 7 (seven) days.   No facility-administered encounter medications on file as of 09/25/2022.     SIGNIFICANT DIAGNOSTIC EXAMS  PREVIOUS  03-08-22: dexa scan t score -2.346   NO  NEW EXAMS    LABS REVIEWED PREVIOUS;     11-21-21: d-dimer: >20 01-04-22: wbc 5.4; hgb 11.7; hct 40.9; mcv 80.2 plt 280; glucose 88 bun 14; creat 0.63; k+ 4.8; na++ 134; ca 8.8; gfr>60; protein 7.9; albumin 3.6; iron 44; tibc 308; ferritin 90  01-12-22: hgb a1c 5.3; urine micro-albumin 4.8 04-06-22: wbc 5.3; hgb 12.4; hct 39.7; mcv 82.9 plt 363; glucose 97; bun 20; creat 0.59; k+ 4.1; na++ 136; ca 8.8; gfr >60; protein 7.0  albumin 3.2; hgb A1c 6.0; vitamin D: 19.98; urine micro-albumin <3.0 04-13-22; wbc 8.0; hgb 12.8; hct 41.7; mcv 85.1 plt 319; iron 91; tibc 266; ferritin 203; vitamin D 19.98  07-06-22: urine micro-albumin 15.6 08-11-22: wbc 5.7; hgb 11.7; hct 37.8; mcv 88.3 plt 316; glucose 86; bun 17; creat 0.62; k+ 4.1; na++ 138; ca 8.6; gfr >60  NO NEW LABS.    Review of Systems  Constitutional:  Negative for malaise/fatigue.  Respiratory:  Negative for cough and shortness of breath.   Cardiovascular:  Negative for chest pain, palpitations and leg swelling.  Gastrointestinal:  Negative for abdominal pain, constipation and heartburn.  Musculoskeletal:  Negative for back pain, joint pain and myalgias.  Skin: Negative.   Neurological:  Negative for dizziness.  Psychiatric/Behavioral:  The patient is not nervous/anxious.     Physical Exam Constitutional:      General: She is not in acute distress.    Appearance: She is well-developed. She is obese. She is not diaphoretic.  Neck:     Thyroid: No thyromegaly.  Cardiovascular:     Rate and Rhythm: Normal rate and regular rhythm.     Pulses: Normal pulses.     Heart sounds: Normal heart sounds.  Pulmonary:     Effort: Pulmonary effort is normal. No respiratory distress.     Breath sounds: Normal breath sounds.  Abdominal:     General: Bowel sounds are normal. There is no distension.     Palpations: Abdomen is soft.     Tenderness: There is no abdominal tenderness.  Musculoskeletal:     Cervical back: Neck supple.     Right lower  leg: No edema.     Left lower leg: No edema.     Comments:  Contractures lower extremities    Lymphadenopathy:     Cervical: No cervical adenopathy.  Skin:    General: Skin is warm and dry.  Neurological:     Mental Status: She is alert. Mental status is at baseline.  Psychiatric:        Mood and Affect: Mood normal.     ASSESSMENT/ PLAN:  TODAY;   Vascular dementia without behavioral disturbance: weight is 236 pounds  2. Aortic atherosclerosis (ct 09-03-19)  3. Hypokalemia: k+ 4.1 will monitor    PREVIOUS   4. Increased ocular pressure bilateral: will continue xalatan to both eyes  5. Primary osteoarthritis right knee and left index finger; will continue tylenol 650 mg twice daily   6. Unspecified protein calorie malnutrition: albumin 3.2; protein 7.0  7. Essential hypertension: b/p 111/62 will continue asa 81 mg daily   8. Chronic anemia: hgb 11.7; will continue iron twice weekly   9. GERD without esophagitis: will continue prilosec 20 mg daily   10. Vitamin D deficiency: level 19.98; will continue vitamin D 50,000 units weekly   11. Urinary urgency; is complete incontinent off myrbetriq    Ok Edwards NP [facility] Adult Medicine  call [phone]

## 2022-10-12 ENCOUNTER — Inpatient Hospital Stay: Payer: Medicare HMO | Attending: Hematology

## 2022-10-12 DIAGNOSIS — Z993 Dependence on wheelchair: Secondary | ICD-10-CM | POA: Insufficient documentation

## 2022-10-12 DIAGNOSIS — D75839 Thrombocytosis, unspecified: Secondary | ICD-10-CM | POA: Diagnosis not present

## 2022-10-12 DIAGNOSIS — I10 Essential (primary) hypertension: Secondary | ICD-10-CM | POA: Diagnosis not present

## 2022-10-12 DIAGNOSIS — D509 Iron deficiency anemia, unspecified: Secondary | ICD-10-CM | POA: Insufficient documentation

## 2022-10-12 DIAGNOSIS — N649 Disorder of breast, unspecified: Secondary | ICD-10-CM | POA: Diagnosis not present

## 2022-10-12 DIAGNOSIS — E119 Type 2 diabetes mellitus without complications: Secondary | ICD-10-CM | POA: Insufficient documentation

## 2022-10-12 DIAGNOSIS — Z87891 Personal history of nicotine dependence: Secondary | ICD-10-CM | POA: Diagnosis not present

## 2022-10-12 DIAGNOSIS — D500 Iron deficiency anemia secondary to blood loss (chronic): Secondary | ICD-10-CM

## 2022-10-12 DIAGNOSIS — R7989 Other specified abnormal findings of blood chemistry: Secondary | ICD-10-CM | POA: Insufficient documentation

## 2022-10-12 DIAGNOSIS — Z7901 Long term (current) use of anticoagulants: Secondary | ICD-10-CM | POA: Diagnosis not present

## 2022-10-12 LAB — CBC WITH DIFFERENTIAL/PLATELET
Abs Immature Granulocytes: 0.01 10*3/uL (ref 0.00–0.07)
Basophils Absolute: 0 10*3/uL (ref 0.0–0.1)
Basophils Relative: 0 %
Eosinophils Absolute: 0.2 10*3/uL (ref 0.0–0.5)
Eosinophils Relative: 4 %
HCT: 41 % (ref 36.0–46.0)
Hemoglobin: 12.8 g/dL (ref 12.0–15.0)
Immature Granulocytes: 0 %
Lymphocytes Relative: 28 %
Lymphs Abs: 1.6 10*3/uL (ref 0.7–4.0)
MCH: 27.2 pg (ref 26.0–34.0)
MCHC: 31.2 g/dL (ref 30.0–36.0)
MCV: 87 fL (ref 80.0–100.0)
Monocytes Absolute: 0.5 10*3/uL (ref 0.1–1.0)
Monocytes Relative: 8 %
Neutro Abs: 3.5 10*3/uL (ref 1.7–7.7)
Neutrophils Relative %: 60 %
Platelets: ADEQUATE 10*3/uL (ref 150–400)
RBC: 4.71 MIL/uL (ref 3.87–5.11)
RDW: 13.5 % (ref 11.5–15.5)
Smear Review: ADEQUATE
WBC: 5.8 10*3/uL (ref 4.0–10.5)
nRBC: 0 % (ref 0.0–0.2)

## 2022-10-12 LAB — FERRITIN: Ferritin: 173 ng/mL (ref 11–307)

## 2022-10-12 LAB — IRON AND TIBC
Iron: 46 ug/dL (ref 28–170)
Saturation Ratios: 18 % (ref 10.4–31.8)
TIBC: 256 ug/dL (ref 250–450)
UIBC: 210 ug/dL

## 2022-10-18 NOTE — Progress Notes (Unsigned)
[name],  [id number]   CLINIC:  Medical Oncology/Hematology  PCP:  Anna Fee, NP [place] [address] [phone]   REASON FOR VISIT:  Follow-up for elevated D-dimer and iron deficiency anemia  PRIOR THERAPY: Eliquis 5 mg twice daily  CURRENT THERAPY: Oral iron supplementation + intermittent IV iron  INTERVAL HISTORY:   Anna Hanna 88 y.o. female returns for routine follow-up of her elevated D-dimer and iron deficiency anemia.  She was last seen by Tarri Abernethy PA-C on 04/20/2022.  At today's visit, she reports feeling well.  She has not had any recent infections, surgeries, hospitalizations.  She does not have any current signs of DVT or PE.  She denies any unilateral leg swelling, pain, and erythema.  She has not noticed any new shortness of breath, dyspnea on exertion, chest pain, hemoptysis, and palpitations.  She has not had any recent infections, surgeries, hospitalizations   She reports that she has intermittent soft, black-colored bowel movements, which she thinks may be from taking her iron pill twice weekly.  She has mild fatigue. She denies any pica, restless legs, headaches, chest pain, dyspnea on exertion, lightheadedness, or syncope.  She has 75% energy and 50% appetite. She endorses that she is maintaining a stable weight.   ASSESSMENT & PLAN:  1.  Elevated D-dimer - From extensive review of the chart, it appears that she had COVID infection with elevated D-dimer in June 2022. - Eliquis was started at low-dose of 2.5 mg twice daily around 01/19/2021.  At some point she was switched to Lovenox and then switched back to Eliquis at 5 mg twice daily which she remains on at this time. - She had a CT chest angiogram on 02/18/2021 on 05/05/2021 both of which were negative for pulmonary embolism.  Lower extremity Doppler on 02/24/2021 was negative for DVT. - Denies any recent infections or  hospitalizations.  Denies any recent surgeries. - Denies any B symptoms. - Declined EGD/colonoscopy. - 2D echocardiogram (09/20/2021): LVEF 55 to [id number], grade 1 diastolic dysfunction.  No major valvulopathy is noted. - CT abdomen pelvis was obtained on 01/04/2022 to rule out occult malignancy in the setting of elevated D-dimer, Hemoccult positive stools, and iron deficiency anemia - results revealed left breast calcification suspected to be benign (stable compared to CT abdomen/pelvis from February 2021), no findings suspicious for malignancy. - Hematology work-up (08/26/2021) ruled out chronic DIC Fibrinogen minimally elevated at 477  Elevated factor VIII at 233% Normal factor V. Elevated INR 1.4/PT 17.1, APTT normal at 32. - Most recent D-dimer (11/22/2021): Markedly elevated at > 20 - Discussed with Dr. Delton Coombes, who recommends against further D-dimer testing at this time, unless patient is exhibiting symptoms concerning for possible blood clots. - Discussed with patient that there is wide differential for elevated D-dimer, including clotting, inflammation, infection, and malignancy. - PLAN: No indication for anticoagulation or ongoing testing of D-dimer.  We will discontinue Eliquis and recommend that D-dimer only be checked if there is clinical suspicion for DVT or PE.   2.  Left breast lesion - Patient has history of oil cyst in the left breast, excisional biopsy performed on 09/17/2002, but I am unable to review pathology reports.  Presumably benign, since patient denies any history of breast cancer.  - Last mammogram on record was 07/01/2015, which is BI-RADS Category 1 negative, no mammographic evidence of malignancy.  No mention of left breast calcification made and report, but  it is clearly visible on my personal review of images, nonconcerning in appearance (round, uniform macrocalcification).   - Lesion noted on CT abdomen/pelvis with contrast on 09/03/2019 as a "peripherally calcified 2.2 cm  lesion in the left breast/low left axilla partially included" - CT abdomen/pelvis (01/04/2022): "Peripherally calcified nodular density in left breast is rounded measuring 18 mm and appears unchanged" - Suspected to be benign calcification of left breast, given appearance and stability dating back to mammogram in 2016 and prior.   - PLAN: No further work-up or follow-up needed   3.  Iron deficiency anemia - Hemoccult positive x2 in December 2022 - Patient saw GI (Dr. Abbey Chatters) on 08/11/2021, but declined further work-up with EGD/colonoscopy. - Labs from 08/26/2021 showed Hgb 9.9/MCV 77.4.  Ferritin 10 with iron saturation 5%.  Mild thrombocytosis with platelets 410. - Additional labs (08/26/2021): Normal SPEP.  Normal B12, methylmalonic acid, folate, copper. - She reports black stool, but thinks that this may be from her iron tablet. - She is taking daily iron polysaccharide tablet. - Most recent IV iron Venofer 300 mg x 3 from 01/20/2022 through 02/17/2022 - Most recent labs (10/12/2022): Hgb 12.8, iron saturation 18%, ferritin 173 - CT abdomen/pelvis (01/04/2022): No evidence of GI tract malignancy - Discussed with patient that she likely has chronic GI blood loss from unknown source and that while her CT scan did not show any evidence of malignancy, this does not mean that it is completely excluded.  She declines further work-up at this time.  She would like to treat supportively with iron repletion as needed. - Blood and iron levels have improved after discontinuation of Eliquis - PLAN: No indication for IV iron at this time - Repeat labs and RTC in 6 months, or sooner if needed   3.  Thrombocytosis - Intermittent mild thrombocytosis noted since at least 2018 - Resolved after iron supplementation   4.  Social/family history: - She reports that she has been at [facility] for the last couple of years after she broke her right leg and cannot walk.  She is wheelchair dependent.  She worked in a Environmental manager prior to retirement.  Quit smoking 2 years ago.  Smoked half pack per day for 15 years. - No family history of malignancies.    PLAN SUMMARY: >> -Reviewed labs with patient.  Hemoglobin has improved to 12.8 with normal differential.  Iron studies have normalized and ferritin is 173 >> No additional IV iron needed at this time. >> Return to clinic in 6 months for labs and assessment or sooner if needed.   [facility] **   You were seen today by Rulon Abide for iron deficiency anemia.    You do not need any additional IV iron at this time.  Please continue iron supplements.  LABS: Return in 6 months for lab work.  OTHER TESTS: None  MEDICATIONS: Continue normal medications.  No new medications at today's visit.  FOLLOW-UP APPOINTMENT: Follow-up in 6 months with lab work and provider.     REVIEW OF SYSTEMS:   Review of Systems  Constitutional:  Positive for fatigue.  Respiratory:  Positive for shortness of breath. Negative for cough.   Gastrointestinal:  Negative for diarrhea, nausea and vomiting.  Musculoskeletal:  Positive for arthralgias and gait problem (Uses wheelchair).  Neurological:  Positive for gait problem (Uses wheelchair). Negative for dizziness and headaches.  Psychiatric/Behavioral:  Positive for depression. The  patient is not nervous/anxious.      PHYSICAL EXAM:  ECOG PERFORMANCE STATUS: 3 - Symptomatic, >50% confined to bed  Vitals:   10/19/22 1336  BP: 131/63  Pulse: 91  Resp: 18  Temp: 97.9 F (36.6 C)   Filed Weights   Physical Exam Vitals reviewed.  Constitutional:      Appearance: Normal appearance. She is obese.  Cardiovascular:     Rate and Rhythm: Normal rate and regular rhythm.  Pulmonary:     Effort: Pulmonary effort is normal.     Breath sounds: Normal breath sounds.  Abdominal:     General: Bowel sounds are normal.  Lymphadenopathy:      Cervical: No cervical adenopathy.  Skin:    General: Skin is warm and dry.  Neurological:     Mental Status: She is alert and oriented to person, place, and time.     PAST MEDICAL/SURGICAL HISTORY:  Past Medical History:  Diagnosis Date   Abnormal posture    Per [facility] Records [facility]    Anemia    Cognitive communication deficit    Per [facility]    Contracture of left knee    Per [facility], [facility]    Diabetes mellitus without complication ([place])    Difficulty walking    Per [facility], [facility]    Extended spectrum beta lactamase (ESBL) resistance    Per [facility], [facility]    Gastroesophageal reflux disease    Per [facility], [facility]    Glaucoma    Gram negative sepsis ([place])    Per [facility], [facility]    Hypercholesteremia    Hypertension    Hypokalemia    Infection, bacterial    Per [facility], [facility] Records [facility]    Iron deficiency anemia due to chronic blood loss 11/23/2021   Muscle weakness (generalized)    Per [facility], [facility]    Need for assistance with personal care    Per [facility], [facility]    OAB (overactive bladder)    Per [facility], [facility]    Ocular hypertension, bilateral    Per [facility]    Osteoarthritis of right knee    Per [facility], [facility]    Paraplegia [facility])    Pyelonephritis, acute    Per [facility], [facility]    Status post total right  knee replacement    Unspecified dementia without behavioral disturbance    Per [facility], [facility] Records [facility]    Urgency of urination    Per [facility], [facility]    Wheelchair dependence    Per Zadie [place], [facility] Records [facility]    Past Surgical History:  Procedure Laterality Date   ABDOMINAL HYSTERECTOMY     CATARACT EXTRACTION     COLONOSCOPY N/A 12/10/2013   Danie Binder, MD ,Gi revealed redundant colon   TOTAL KNEE ARTHROPLASTY Right 03/30/2016   Procedure: TOTAL KNEE ARTHROPLASTY;  Surgeon: Carole Civil, MD;  Location: [facility];  Service: Orthopedics;  Laterality: Right;    SOCIAL  HISTORY:  Social History   Socioeconomic History   Marital status: Divorced    Spouse name: Not on file   Number of children: Not on file   Years of education: Not on file   Highest education level: Not on file  Occupational History   Occupation: retired   Tobacco Use   Smoking status: Former    Packs/day: 0.25    Years: 3.00    Additional pack years: 0.00    Total pack years: 0.75    Types: Cigarettes   Smokeless tobacco: Never  Vaping Use   Vaping Use: Never used  Substance and Sexual Activity   Alcohol use: No   Drug use: No   Sexual activity: Not Currently    Birth control/protection: Post-menopausal  Other Topics Concern   Not on file  Social History Narrative   Long term resident of [facility]    Social Determinants of Health   Financial Resource Strain: Not on file  Food Insecurity: Not on file  Transportation Needs: Not on file  Physical Activity: Inactive (02/17/2020)   Exercise Vital Sign    Days of Exercise per Week: 0 days    Minutes of Exercise per Session: 0 min  Stress: Not on file  Social Connections: Not on file  Intimate Partner Violence: Not At Risk (02/17/2020)   Humiliation, Afraid, Rape, and Kick questionnaire    Fear of Current or Ex-Partner: No     Emotionally Abused: No    Physically Abused: No    Sexually Abused: No    FAMILY HISTORY:  Family History  Problem Relation Age of Onset   Cancer Mother    Arthritis Mother    Hypotension Father    Cancer Sister    Hypotension Sister    Colon cancer Neg Hx     CURRENT MEDICATIONS:  Outpatient Encounter Medications as of 10/19/2022  Medication Sig   acetaminophen (TYLENOL) 325 MG tablet Take 650 mg by mouth 2 (two) times daily.   ascorbic acid (VITAMIN C) 500 MG tablet Take 500 mg by mouth daily.   Calcium Carbonate (CALCIUM 600 PO) Take 1,500 mg by mouth daily.   ferrous sulfate 325 (65 FE) MG EC tablet Take 325 mg by mouth 2 (two) times a week. Monday and Thursday   latanoprost (XALATAN) 0.005 % ophthalmic solution Place 1 drop into both eyes at bedtime. wait 5 minutes between multiple eye drops   loratadine (CLARITIN) 10 MG tablet Take 10 mg by mouth daily.   NON FORMULARY Diet: NAS,   omeprazole (PRILOSEC) 20 MG capsule Take 20 mg by mouth 2 (two) times daily before a meal. Special Instructions: *TAKE ON AN EMPTY STOMACH* FOR GERD *DO NOT CRUSH* (FORMULARY SUB FOR PANTOPRAZOLE 40MG )   Vitamin D, Ergocalciferol, (DRISDOL) 1.25 MG (50000 UNIT) CAPS capsule Take 50,000 Units by mouth every 7 (seven) days.   No facility-administered encounter medications on file as of 10/19/2022.    ALLERGIES:  No Known Allergies  LABORATORY DATA:  I have reviewed the labs as listed.  CBC    Component Value Date/Time   WBC 5.8 10/12/2022 1316   RBC 4.71 10/12/2022 1316   HGB 12.8 10/12/2022 1316   HCT 41.0 10/12/2022 1316   HCT 26.4 (L) 04/03/2016 0550   PLT  10/12/2022 1316    PLATELET CLUMPS NOTED ON SMEAR, COUNT APPEARS ADEQUATE   MCV 87.0 10/12/2022 1316   MCH 27.2 10/12/2022 1316   MCHC 31.2 10/12/2022 1316   RDW 13.5 10/12/2022 1316  LYMPHSABS 1.6 10/12/2022 1316   MONOABS 0.5 10/12/2022 1316   EOSABS 0.2 10/12/2022 1316   BASOSABS 0.0 10/12/2022 1316      Latest Ref Rng &  Units 08/11/2022    8:00 AM 04/06/2022    4:15 AM 01/04/2022    1:02 PM  CMP  Glucose 70 - 99 mg/dL 86  97  88   BUN 8 - 23 mg/dL 17  20  14    Creatinine 0.44 - 1.00 mg/dL 0.62  0.59  0.63   Sodium 135 - 145 mmol/L 138  136  134   Potassium 3.5 - 5.1 mmol/L 4.1  4.1  4.8   Chloride 98 - 111 mmol/L 101  104  101   CO2 22 - 32 mmol/L 29  25  25    Calcium 8.9 - 10.3 mg/dL 8.6  8.8  8.8   Total Protein 6.5 - 8.1 g/dL  7.0  7.7   Total Bilirubin 0.3 - 1.2 mg/dL  0.3  0.4   Alkaline Phos 38 - 126 U/L  85  80   AST 15 - 41 U/L  12  15   ALT 0 - 44 U/L  9  11     DIAGNOSTIC IMAGING:  I have independently reviewed the relevant imaging and discussed with the patient.   WRAP UP:  All questions were answered. The patient knows to call the clinic with any problems, questions or concerns.  Time spent on visit: I spent 20 minutes counseling the patient face to face. The total time spent in the appointment was 40 minutes and more than 50% was on counseling.  Faythe Casa, NP 10/19/2022 2:27 PM

## 2022-10-19 ENCOUNTER — Inpatient Hospital Stay (HOSPITAL_BASED_OUTPATIENT_CLINIC_OR_DEPARTMENT_OTHER): Payer: Medicare HMO | Admitting: Oncology

## 2022-10-19 VITALS — BP 131/63 | HR 91 | Temp 97.9°F | Resp 18

## 2022-10-19 DIAGNOSIS — D500 Iron deficiency anemia secondary to blood loss (chronic): Secondary | ICD-10-CM | POA: Diagnosis not present

## 2022-10-19 DIAGNOSIS — D509 Iron deficiency anemia, unspecified: Secondary | ICD-10-CM | POA: Diagnosis not present

## 2022-10-25 ENCOUNTER — Non-Acute Institutional Stay (SKILLED_NURSING_FACILITY): Payer: Medicare HMO | Admitting: Adult Health

## 2022-10-25 ENCOUNTER — Encounter: Payer: Self-pay | Admitting: Adult Health

## 2022-10-25 DIAGNOSIS — F01B0 Vascular dementia, moderate, without behavioral disturbance, psychotic disturbance, mood disturbance, and anxiety: Secondary | ICD-10-CM

## 2022-10-25 DIAGNOSIS — R635 Abnormal weight gain: Secondary | ICD-10-CM

## 2022-10-25 NOTE — Progress Notes (Signed)
Location:  [facility] Room Number: 105 D Place of Service:  SNF (31) Provider: Synthia Innocent, NP  CODE STATUS: FULL CODE  No Known Allergies  Chief Complaint  Patient presents with   Acute Visit    Weight issues    HPI:  She continues to gain weight. She has gained 16 pounds over the past 6 months. She has a great appetite; eats all of her meals. She has plenty of snacks present and does eat throughout the day. She is able to propel herself throughout the facility. There are no reports of worsening shortness of breath; no palpitation; no chest pain.   Past Medical History:  Diagnosis Date   Abnormal posture    Per [facility] Records [facility]    Anemia    Cognitive communication deficit    Per [facility]    Contracture of left knee    Per [facility], [facility]    Diabetes mellitus without complication    Difficulty walking    Per [facility], [facility]    Extended spectrum beta lactamase (ESBL) resistance    Per [facility], [facility] Records [facility]    Gastroesophageal reflux disease    Per [facility], [facility]    Glaucoma    Gram negative sepsis    Per [facility], [facility] Records [facility]    Hypercholesteremia    Hypertension    Hypokalemia    Infection, bacterial    Per [facility], [facility] Records [facility]    Iron deficiency anemia due to chronic blood loss 11/23/2021   Muscle weakness (generalized)    Per [facility], [facility] Records [facility]    Need for assistance with personal care    Per [facility], [facility] Records [facility]    OAB (overactive bladder)    Per [facility], [facility] Electronic  [facility] Records System    Ocular hypertension, bilateral    Per [facility], [facility] Records [facility]    Osteoarthritis of right knee    Per [facility], [facility] Records [facility]    Paraplegia    Pyelonephritis, acute    Per [facility], [facility] Records [facility]    Status post total right knee replacement    Unspecified dementia without behavioral disturbance    Per [facility], [facility] Records [facility]    Urgency of urination    Per [facility], [facility] Records [facility]    Wheelchair dependence    Per Ria Bush, [facility] Records [facility]     Past Surgical History:  Procedure Laterality Date   ABDOMINAL HYSTERECTOMY     CATARACT EXTRACTION     COLONOSCOPY N/A 12/10/2013   West Bali, MD ,Gi revealed redundant colon   TOTAL KNEE ARTHROPLASTY Right 03/30/2016   Procedure: TOTAL KNEE ARTHROPLASTY;  Surgeon: Vickki Hearing, MD;  Location: [facility];  Service: Orthopedics;  Laterality: Right;    Social History   Socioeconomic History   Marital status: Divorced    Spouse name: Not on file   Number of children: Not on file   Years of education: Not on file   Highest education level: Not on file  Occupational History   Occupation:  retired   Tobacco Use   Smoking status: Former    Packs/day: 0.25    Years: 3.00    Additional pack years: 0.00    Total pack years: 0.75    Types: Cigarettes   Smokeless tobacco: Never  Vaping Use   Vaping Use: Never used  Substance and Sexual Activity   Alcohol use: No   Drug use: No   Sexual activity: Not Currently    Birth control/protection: Post-menopausal  Other Topics Concern   Not on file  Social History Narrative   Long term resident of [facility]    Social Determinants of Health   Financial Resource Strain: Not on file  Food Insecurity: Not on file  Transportation Needs: Not  on file  Physical Activity: Inactive (02/17/2020)   Exercise Vital Sign    Days of Exercise per Week: 0 days    Minutes of Exercise per Session: 0 min  Stress: Not on file  Social Connections: Not on file  Intimate Partner Violence: Not At Risk (02/17/2020)   Humiliation, Afraid, Rape, and Kick questionnaire    Fear of Current or Ex-Partner: No    Emotionally Abused: No    Physically Abused: No    Sexually Abused: No   Family History  Problem Relation Age of Onset   Cancer Mother    Arthritis Mother    Hypotension Father    Cancer Sister    Hypotension Sister    Colon cancer Neg Hx       VITAL SIGNS BP (!) 122/52   Pulse 66   Temp 98.5 F (36.9 C)   Resp (!) 22   Ht  (1.651 m)   Wt 236 lb (107 kg)   SpO2 97%   BMI 39.27 kg/m   Outpatient Encounter Medications as of 10/25/2022  Medication Sig   acetaminophen (TYLENOL) 650 MG CR tablet Take 650 mg by mouth 2 (two) times daily.   calcium-vitamin D (OSCAL WITH D) 500-5 MG-MCG tablet Take 1 tablet by mouth daily.   Dimethicone (AVEENO DAILY MOISTURIZING) 1.25 % LOTN Apply topically daily as needed.   latanoprost (XALATAN) 0.005 % ophthalmic solution Place 1 drop into both eyes at bedtime. wait 5 minutes between multiple eye drops   loratadine (CLARITIN) 10 MG tablet Take 10 mg by mouth daily.   NON FORMULARY Diet: NAS,   omeprazole (PRILOSEC) 20 MG capsule Take 20 mg by mouth 2 (two) times daily before a meal. Special Instructions: *TAKE ON AN EMPTY STOMACH* FOR GERD *DO NOT CRUSH* (FORMULARY SUB FOR PANTOPRAZOLE )   [DISCONTINUED] acetaminophen (TYLENOL) 325 MG tablet Take 650 mg by mouth 2 (two) times daily.   [DISCONTINUED] ascorbic acid (VITAMIN C) 500 MG tablet Take 500 mg by mouth daily.   [DISCONTINUED] Calcium Carbonate (CALCIUM 600 PO) Take 1,500 mg by mouth daily.   [DISCONTINUED] ferrous sulfate 325 (65 FE) MG EC tablet Take 325 mg by mouth 2 (two) times a week. Monday and Thursday   [DISCONTINUED]  Vitamin D, Ergocalciferol, (DRISDOL) 1.25 MG (50000 UNIT) CAPS capsule Take 50,000 Units by mouth every 7 (seven) days.   No facility-administered encounter medications on file as of 10/25/2022.     SIGNIFICANT DIAGNOSTIC EXAMS  PREVIOUS  03-08-22: dexa scan t score -2.346   NO NEW EXAMS    LABS REVIEWED PREVIOUS;     11-21-21: d-dimer: >20 01-04-22: wbc 5.4; hgb 11.7; hct 40.9; mcv 80.2 plt 280; glucose 88 bun 14; creat 0.63; k+ 4.8; na++ 134; ca  8.8; gfr>60; protein 7.9; albumin 3.6; iron 44; tibc 308; ferritin 90  01-12-22: hgb a1c 5.3; urine micro-albumin 4.8 04-06-22: wbc 5.3; hgb 12.4; hct 39.7; mcv 82.9 plt 363; glucose 97; bun 20; creat 0.59; k+ 4.1; na++ 136; ca 8.8; gfr >60; protein 7.0 albumin 3.2; hgb A1c 6.0; vitamin D: 19.98; urine micro-albumin <3.0 04-13-22; wbc 8.0; hgb 12.8; hct 41.7; mcv 85.1 plt 319; iron 91; tibc 266; ferritin 203; vitamin D 19.98  07-06-22: urine micro-albumin 15.6 08-11-22: wbc 5.7; hgb 11.7; hct 37.8; mcv 88.3 plt 316; glucose 86; bun 17; creat 0.62; k+ 4.1; na++ 138; ca 8.6; gfr >60  NO NEW LABS.    Review of Systems  Constitutional:  Negative for malaise/fatigue.  Respiratory:  Negative for cough and shortness of breath.   Cardiovascular:  Negative for chest pain, palpitations and leg swelling.  Gastrointestinal:  Negative for abdominal pain, constipation and heartburn.  Musculoskeletal:  Negative for back pain, joint pain and myalgias.  Skin: Negative.   Neurological:  Negative for dizziness.  Psychiatric/Behavioral:  The patient is not nervous/anxious.    Physical Exam Constitutional:      General: She is not in acute distress.    Appearance: She is well-developed. She is obese. She is not diaphoretic.  Neck:     Thyroid: No thyromegaly.  Cardiovascular:     Rate and Rhythm: Normal rate and regular rhythm.     Pulses: Normal pulses.     Heart sounds: Normal heart sounds.  Pulmonary:     Effort: Pulmonary effort is normal. No  respiratory distress.     Breath sounds: Normal breath sounds.  Abdominal:     General: Bowel sounds are normal. There is no distension.     Palpations: Abdomen is soft.     Tenderness: There is no abdominal tenderness.  Musculoskeletal:     Cervical back: Neck supple.     Right lower leg: No edema.     Left lower leg: No edema.     Comments:  Contractures lower extremities     Lymphadenopathy:     Cervical: No cervical adenopathy.  Skin:    General: Skin is warm and dry.  Neurological:     Mental Status: She is alert. Mental status is at baseline.  Psychiatric:        Mood and Affect: Mood normal.       ASSESSMENT/ PLAN:  TODAY  Weight gain Moderate vascular dementia without behavioral disturbance; psychotic disturbance; mood disturbance; or anxiety   At this time will not make changes; it is good that she continues to have a good appetite; will monitor    Synthia Innocent NP [facility]   call [phone]

## 2022-10-30 DIAGNOSIS — R635 Abnormal weight gain: Secondary | ICD-10-CM | POA: Insufficient documentation

## 2022-11-06 ENCOUNTER — Encounter: Payer: Self-pay | Admitting: Internal Medicine

## 2022-11-06 ENCOUNTER — Non-Acute Institutional Stay (SKILLED_NURSING_FACILITY): Payer: Medicare HMO | Admitting: Internal Medicine

## 2022-11-06 DIAGNOSIS — E1151 Type 2 diabetes mellitus with diabetic peripheral angiopathy without gangrene: Secondary | ICD-10-CM | POA: Diagnosis not present

## 2022-11-06 DIAGNOSIS — R635 Abnormal weight gain: Secondary | ICD-10-CM | POA: Diagnosis not present

## 2022-11-06 DIAGNOSIS — F01B0 Vascular dementia, moderate, without behavioral disturbance, psychotic disturbance, mood disturbance, and anxiety: Secondary | ICD-10-CM

## 2022-11-06 DIAGNOSIS — D649 Anemia, unspecified: Secondary | ICD-10-CM

## 2022-11-06 NOTE — Assessment & Plan Note (Addendum)
Recently she has exhibited polyphagia with 16 pound weight gain over the previous 6 months.  On 04/06/2022 A1c was 6% up from a prior value of 5.3%. Update A1c & TSH with next labs.

## 2022-11-06 NOTE — Patient Instructions (Signed)
See assessment and plan under each diagnosis in the problem list and acutely for this visit 

## 2022-11-06 NOTE — Assessment & Plan Note (Addendum)
Reported weight gain of 16 pounds over the prior 6 months in the context of excellent appetite and snacking.  Review of systems was positive for "freezing" suggestive of cold intolerance.  Last thyroid function test November 2022. Serially there has been a rise in her A1c with a value of 6% on 04/06/2022. TSH & A1c will be updated with next blood draw.Marland Kitchen

## 2022-11-06 NOTE — Assessment & Plan Note (Addendum)
10/12/2022 CBC and iron panel normal.  H/H corrected from prior values of 11.7/37.8, currently 12.8/41.  No bleeding dyscrasias reported by staff.  Continue to monitor.

## 2022-11-06 NOTE — Assessment & Plan Note (Signed)
She confabulates about going home. She can provide no history. No behavioral issues reported.

## 2022-11-06 NOTE — Progress Notes (Signed)
NURSING HOME LOCATION:  [facility] ROOM NUMBER:  105 D  CODE STATUS:  Full Code  PCP:  Synthia Innocent NP  This is a nursing facility follow up visit of chronic medical diagnoses & to document compliance with Regulation 483.30 (c) in The Long Term Care Survey Manual Phase 2 which mandates caregiver visit ( visits can alternate among physician, PA or NP as per statutes) within 10 days of 30 days / 60 days/ 90 days post admission to SNF date    Interim medical record and care since last SNF visit was updated with review of diagnostic studies and change in clinical status since last visit were documented.  HPI: She is a permanent resident of the facility with medical diagnoses of iron deficiency anemia, GERD, glaucoma, dyslipidemia, essential hypertension, OAB, and diabetes complicated by vascular disease. She was most recently seen by the NP for a weight gain of 16 pounds over the prior 6 months.  Her appetite was described as "great" with almost complete consumption of all meals.  She also snacks throughout the day.  The most recent labs were performed 10/12/2022 and revealed normal CBC and normal iron panel.  In January she had had a normochromic, normocytic anemia with H/H of 11.7/37.8.  Current values are 12.8/41.  In January mild hypocalcemia was present with a value of 8.6.  Renal function was stage II.  The last TSH on record was in November 2022 and revealed a therapeutic value of 1.144.  Serially it has been stable over the previous 4 years.  On 04/06/2022 A1c was 6, up from a prior value of 5.3%.  The most recent CNS imaging was in 2018 and revealed chronic mild global atrophy and chronic microvascular changes.  Review of systems: Dementia invalidated responses.  She confabulated about wanting to go home.  She stated that she would need a new mobile home as "my old one had a leaky roof." She denied any symptoms suggestive of thyroid or diabetic process other than " I am  freezing."  Constitutional: No fever, significant weight change, fatigue  Eyes: No redness, discharge, pain, vision change ENT/mouth: No nasal congestion,  purulent discharge, earache, change in hearing, sore throat  Cardiovascular: No chest pain, palpitations, paroxysmal nocturnal dyspnea, claudication, edema  Respiratory: No cough, sputum production, hemoptysis, DOE, significant snoring, apnea   Gastrointestinal: No heartburn, dysphagia, abdominal pain, nausea /vomiting, rectal bleeding, melena, change in bowels Genitourinary: No dysuria, hematuria, pyuria, incontinence, nocturia Musculoskeletal: No joint stiffness, joint swelling, weakness, pain Dermatologic: No rash, pruritus, change in appearance of skin Neurologic: No dizziness, headache, syncope, seizures, numbness, tingling Psychiatric: No significant anxiety, depression, insomnia, anorexia Endocrine: No change in hair/skin/nails, excessive thirst, excessive hunger, excessive urination  Hematologic/lymphatic: No significant bruising, lymphadenopathy, abnormal bleeding Allergy/immunology: No itchy/watery eyes, significant sneezing, urticaria, angioedema  Physical exam:  Pertinent or positive findings: Morbid obesity is present.  She is edentulous.  She exhibits a slight tach.  The breath sounds are decreased superiorly.  Abdomen is protuberant.  She has trace edema at the sock line.  Pedal pulses are decreased.  There is fusiform change of the knees, especially on the right.  She has flexion contractures of the fingers bilaterally.  General appearance: Adequately nourished; no acute distress, increased work of breathing is present.   Lymphatic: No lymphadenopathy about the head, neck, axilla. Eyes: No conjunctival inflammation or lid edema is present. There is no scleral icterus. Ears:  External ear exam shows no significant lesions or  deformities.   Nose:  External nasal examination shows no deformity or inflammation. Nasal mucosa are  pink and moist without lesions, exudates Neck:  No thyromegaly, masses, tenderness noted.    Heart:  No gallop, murmur, click, rub .  Lungs: without wheezes, rhonchi, rales, rubs. Abdomen: Bowel sounds are normal. Abdomen is soft and nontender with no organomegaly, hernias, masses. GU: Deferred  Extremities:  No cyanosis, clubbing  Neurologic exam :Balance, Rhomberg, finger to nose testing could not be completed due to clinical state Skin: Warm & dry w/o tenting. No significant lesions or rash.  See summary under each active problem in the Problem List with associated updated therapeutic plan

## 2022-12-05 ENCOUNTER — Encounter: Payer: Self-pay | Admitting: Adult Health

## 2022-12-05 ENCOUNTER — Non-Acute Institutional Stay (SKILLED_NURSING_FACILITY): Payer: Medicare HMO | Admitting: Adult Health

## 2022-12-05 DIAGNOSIS — H40053 Ocular hypertension, bilateral: Secondary | ICD-10-CM | POA: Diagnosis not present

## 2022-12-05 DIAGNOSIS — M1711 Unilateral primary osteoarthritis, right knee: Secondary | ICD-10-CM | POA: Diagnosis not present

## 2022-12-05 DIAGNOSIS — E441 Mild protein-calorie malnutrition: Secondary | ICD-10-CM | POA: Diagnosis not present

## 2022-12-05 NOTE — Progress Notes (Signed)
Location:  [facility] Room Number: 105D Place of Service:  SNF (31)   CODE STATUS: Full Code   No Known Allergies  Chief Complaint  Patient presents with   Medical Management of Chronic Issues                  Increased ocular pressure bilateral: Primary osteoarthritis right knee: Unspecified protein calorie malnutrition     HPI:  She is a 88 year old long term resident of this facility being seen for the management of her chronic illnesses: Increased ocular pressure bilateral: Primary osteoarthritis right knee: Unspecified protein calorie malnutrition. There are no reports of uncontrolled pain. Her weight is stable.   Past Medical History:  Diagnosis Date   Abnormal posture    Per [facility], [facility] Electronic [facility] Records System    Anemia    Cognitive communication deficit    Per [facility], [facility] Records [facility]    Contracture of left knee    Per [facility], [facility] Records [facility]    Diabetes mellitus without complication (HCC)    Difficulty walking    Per [facility], [facility] Records [facility]    Extended spectrum beta lactamase (ESBL) resistance    Per [facility], [facility] Records [facility]    Gastroesophageal reflux disease    Per [facility], [facility] Records [facility]    Glaucoma    Gram negative sepsis (HCC)    Per [facility], [facility] Records [facility]    Hypercholesteremia    Hypertension    Hypokalemia    Infection, bacterial    Per [facility], [facility] Records [facility]    Iron deficiency anemia due to chronic blood loss 11/23/2021   Muscle weakness (generalized)    Per [facility], [facility] Records [facility]    Need for assistance with personal care    Per [facility], [facility] Electronic Medical Records  System    OAB (overactive bladder)    Per [facility], [facility] Records [facility]    Ocular hypertension, bilateral    Per [facility], [facility] Records [facility]    Osteoarthritis of right knee    Per [facility], [facility] Records [facility]    Paraplegia [facility])    Pyelonephritis, acute    Per [facility], [facility] Records [facility]    Status post total right knee replacement    Unspecified dementia without behavioral disturbance    Per [facility], [facility] Records [facility]    Urgency of urination    Per [facility], [facility] Records [facility]    Wheelchair dependence    Per [facility], [facility] Records [facility]     Past Surgical History:  Procedure Laterality Date   ABDOMINAL HYSTERECTOMY     CATARACT EXTRACTION     COLONOSCOPY N/A 12/10/2013   West Bali, MD ,Gi revealed redundant colon   TOTAL KNEE ARTHROPLASTY Right 03/30/2016   Procedure: TOTAL KNEE ARTHROPLASTY;  Surgeon: Vickki Hearing, MD;  Location: [facility];  Service: Orthopedics;  Laterality: Right;    Social History   Socioeconomic History   Marital status: Divorced    Spouse name: Not on file   Number of children: Not on file   Years of education: Not on file  Highest education level: Not on file  Occupational History   Occupation: retired   Tobacco Use   Smoking status: Former    Packs/day: 0.25    Years: 3.00    Additional pack years: 0.00    Total pack years: 0.75    Types: Cigarettes   Smokeless tobacco: Never  Vaping Use   Vaping Use: Never used  Substance and Sexual Activity   Alcohol use: No   Drug use: No   Sexual activity: Not Currently    Birth control/protection: Post-menopausal  Other Topics Concern   Not on file  Social History Narrative   Long term resident of [facility]    Social Determinants of Health    Financial Resource Strain: Not on file  Food Insecurity: Not on file  Transportation Needs: Not on file  Physical Activity: Inactive (02/17/2020)   Exercise Vital Sign    Days of Exercise per Week: 0 days    Minutes of Exercise per Session: 0 min  Stress: Not on file  Social Connections: Not on file  Intimate Partner Violence: Not At Risk (02/17/2020)   Humiliation, Afraid, Rape, and Kick questionnaire    Fear of Current or Ex-Partner: No    Emotionally Abused: No    Physically Abused: No    Sexually Abused: No   Family History  Problem Relation Age of Onset   Cancer Mother    Arthritis Mother    Hypotension Father    Cancer Sister    Hypotension Sister    Colon cancer Neg Hx       VITAL SIGNS BP 138/89   Pulse 85   Temp 97.8 F (36.6 C) (Temporal)   Resp 20   Ht 5\' 5"  (1.651 m)   Wt 240 lb 8 oz (109.1 kg)   SpO2 98%   BMI 40.02 kg/m   Outpatient Encounter Medications as of 12/05/2022  Medication Sig   acetaminophen (TYLENOL) 650 MG CR tablet Take 650 mg by mouth 2 (two) times daily.   calcium-vitamin D (OSCAL WITH D) 500-5 MG-MCG tablet Take 1 tablet by mouth daily.   Dimethicone (AVEENO DAILY MOISTURIZING) 1.25 % LOTN Apply topically daily as needed.   latanoprost (XALATAN) 0.005 % ophthalmic solution Place 1 drop into both eyes at bedtime. wait 5 minutes between multiple eye drops   loratadine (CLARITIN) 10 MG tablet Take 10 mg by mouth daily.   NON FORMULARY Diet: NAS,   omeprazole (PRILOSEC) 20 MG capsule Take 20 mg by mouth 2 (two) times daily before a meal. Special Instructions: *TAKE ON AN EMPTY STOMACH* FOR GERD *DO NOT CRUSH* (FORMULARY SUB FOR PANTOPRAZOLE 40MG )   No facility-administered encounter medications on file as of 12/05/2022.     SIGNIFICANT DIAGNOSTIC EXAMS  PREVIOUS  03-08-22: dexa scan t score -2.346   NO NEW EXAMS    LABS REVIEWED PREVIOUS;     11-21-21: d-dimer: >20 01-04-22: wbc 5.4; hgb 11.7; hct 40.9; mcv 80.2 plt 280; glucose  88 bun 14; creat 0.63; k+ 4.8; na++ 134; ca 8.8; gfr>60; protein 7.9; albumin 3.6; iron 44; tibc 308; ferritin 90  01-12-22: hgb a1c 5.3; urine micro-albumin 4.8 04-06-22: wbc 5.3; hgb 12.4; hct 39.7; mcv 82.9 plt 363; glucose 97; bun 20; creat 0.59; k+ 4.1; na++ 136; ca 8.8; gfr >60; protein 7.0 albumin 3.2; hgb A1c 6.0; vitamin D: 19.98; urine micro-albumin <3.0 04-13-22; wbc 8.0; hgb 12.8; hct 41.7; mcv 85.1 plt 319; iron 91; tibc 266; ferritin 203; vitamin D 19.98  07-06-22:  urine micro-albumin 15.6 08-11-22: wbc 5.7; hgb 11.7; hct 37.8; mcv 88.3 plt 316; glucose 86; bun 17; creat 0.62; k+ 4.1; na++ 138; ca 8.6; gfr >60  NO NEW LABS.    Review of Systems  Constitutional:  Negative for malaise/fatigue.  Respiratory:  Negative for cough and shortness of breath.   Cardiovascular:  Negative for chest pain, palpitations and leg swelling.  Gastrointestinal:  Negative for abdominal pain, constipation and heartburn.  Musculoskeletal:  Negative for back pain, joint pain and myalgias.  Skin: Negative.   Neurological:  Negative for dizziness.  Psychiatric/Behavioral:  The patient is not nervous/anxious.    Physical Exam Constitutional:      General: She is not in acute distress.    Appearance: She is well-developed. She is obese. She is not diaphoretic.  Neck:     Thyroid: No thyromegaly.  Cardiovascular:     Rate and Rhythm: Normal rate and regular rhythm.     Pulses: Normal pulses.     Heart sounds: Normal heart sounds.  Pulmonary:     Effort: Pulmonary effort is normal. No respiratory distress.     Breath sounds: Normal breath sounds.  Abdominal:     General: Bowel sounds are normal. There is no distension.     Palpations: Abdomen is soft.     Tenderness: There is no abdominal tenderness.  Musculoskeletal:     Cervical back: Neck supple.     Right lower leg: No edema.     Left lower leg: No edema.     Comments: Contractures lower extremities      Lymphadenopathy:     Cervical: No  cervical adenopathy.  Skin:    General: Skin is warm and dry.  Neurological:     Mental Status: She is alert. Mental status is at baseline.  Psychiatric:        Mood and Affect: Mood normal.      ASSESSMENT/ PLAN:  TODAY;   Increased ocular pressure bilateral: will continue xalatan to both eyes  2. Primary osteoarthritis right knee: will continue tylenol 650 mg twice daily   3. Unspecified protein calorie malnutrition: albumin 3.2 protein 7.0   PREVIOUS   4. Essential hypertension: b/p 111/62 will continue asa 81 mg daily   5. Chronic anemia: hgb 11.7; will continue iron twice weekly   6. GERD without esophagitis: will continue prilosec 20 mg daily   7. Vitamin D deficiency: level 19.98; will continue vitamin D 50,000 units weekly   8. Urinary urgency; is complete incontinent off myrbetriq   9. Vascular dementia without behavioral disturbance: weight is 240 pounds  10. Aortic atherosclerosis (ct 09-03-19)  11. Hypokalemia: k+ 4.1 will monitor     Synthia Innocent NP [facility]   call [phone]

## 2022-12-07 ENCOUNTER — Other Ambulatory Visit (HOSPITAL_COMMUNITY)
Admission: RE | Admit: 2022-12-07 | Discharge: 2022-12-07 | Disposition: A | Discharge status: Home / Self Care | Payer: Medicare HMO | Source: Skilled Nursing Facility | Attending: Adult Health | Admitting: Adult Health

## 2022-12-07 DIAGNOSIS — E559 Vitamin D deficiency, unspecified: Secondary | ICD-10-CM | POA: Diagnosis not present

## 2022-12-07 DIAGNOSIS — N189 Chronic kidney disease, unspecified: Secondary | ICD-10-CM | POA: Insufficient documentation

## 2022-12-07 DIAGNOSIS — E1122 Type 2 diabetes mellitus with diabetic chronic kidney disease: Secondary | ICD-10-CM | POA: Diagnosis present

## 2022-12-07 LAB — HEMOGLOBIN A1C
Hgb A1c MFr Bld: 5.9 % — ABNORMAL HIGH (ref 4.8–5.6)
Mean Plasma Glucose: 122.63 mg/dL

## 2022-12-08 LAB — VITAMIN D 25 HYDROXY (VIT D DEFICIENCY, FRACTURES)

## 2022-12-11 ENCOUNTER — Non-Acute Institutional Stay (SKILLED_NURSING_FACILITY): Payer: Medicare HMO | Admitting: Adult Health

## 2022-12-11 ENCOUNTER — Encounter: Payer: Self-pay | Admitting: Adult Health

## 2022-12-11 DIAGNOSIS — E559 Vitamin D deficiency, unspecified: Secondary | ICD-10-CM | POA: Diagnosis not present

## 2022-12-11 DIAGNOSIS — E1159 Type 2 diabetes mellitus with other circulatory complications: Secondary | ICD-10-CM | POA: Diagnosis not present

## 2022-12-11 DIAGNOSIS — I152 Hypertension secondary to endocrine disorders: Secondary | ICD-10-CM | POA: Insufficient documentation

## 2022-12-11 NOTE — Progress Notes (Signed)
Location:  [facility] Room Number: North/105/D Place of Service:  SNF (31)   CODE STATUS: Full Code  No Known Allergies  Chief Complaint  Patient presents with   Acute Visit    Follow up labs.    HPI:  Her vitamin D level is low at 19.98.  she denies any headaches; any visual changes any chest pain related to her hypertension.   Past Medical History:  Diagnosis Date   Abnormal posture    Per [employer], [facility] Electronic Medical Records System    Anemia    Cognitive communication deficit    Per [facility], [facility] Records [facility]    Contracture of left knee    Per [facility], [facility] Records [facility]    Diabetes mellitus without complication (HCC)    Difficulty walking    Per [facility], [facility] Records [facility]    Extended spectrum beta lactamase (ESBL) resistance    Per [facility], [facility] Records [facility]    Gastroesophageal reflux disease    Per [facility], [facility] Records [facility]    Glaucoma    Gram negative sepsis (HCC)    Per [facility], [facility] Records [facility]    Hypercholesteremia    Hypertension    Hypokalemia    Infection, bacterial    Per [facility], [facility] Records [facility]    Iron deficiency anemia due to chronic blood loss 11/23/2021   Muscle weakness (generalized)    Per [facility], [facility] Medical Records [facility]    Need for assistance with personal care    Per [facility], [facility] Records [facility]    OAB (overactive bladder)    Per [facility], [facility] Records [facility]    Ocular hypertension, bilateral    Per [facility], [facility] Records [facility]    Osteoarthritis of right knee    Per [facility], [facility] Electronic  Medical Records System    Paraplegia [facility])    Pyelonephritis, acute    Per [facility], [facility] Medical Records [facility]    Status post total right knee replacement    Unspecified dementia without behavioral disturbance    Per [facility], [facility] Medical Records [facility]    Urgency of urination    Per [facility], [facility] Records [facility]    Wheelchair dependence    Per Ria Bush, [facility] Electronic Medical Records [facility]     Past Surgical History:  Procedure Laterality Date   ABDOMINAL HYSTERECTOMY     CATARACT EXTRACTION     COLONOSCOPY N/A 12/10/2013   West Bali, MD ,Gi revealed redundant colon   TOTAL KNEE ARTHROPLASTY Right 03/30/2016   Procedure: TOTAL KNEE ARTHROPLASTY;  Surgeon: Vickki Hearing, MD;  Location: [facility];  Service: Orthopedics;  Laterality: Right;    Social History   Socioeconomic History   Marital status: Divorced    Spouse name: Not on file   Number of children: Not on file   Years of education: Not on file   Highest education level: Not on file  Occupational History   Occupation: retired   Tobacco Use   Smoking status: Former    Packs/day: 0.25    Years: 3.00    Additional pack years: 0.00    Total pack years: 0.75  Types: Cigarettes   Smokeless tobacco: Never  Vaping Use   Vaping Use: Never used  Substance and Sexual Activity   Alcohol use: No   Drug use: No   Sexual activity: Not Currently    Birth control/protection: Post-menopausal  Other Topics Concern   Not on file  Social History Narrative   Long term resident of [facility]    Social Determinants of Health   Financial Resource Strain: Not on file  Food Insecurity: Not on file  Transportation Needs: Not on file  Physical Activity: Inactive (02/17/2020)   Exercise Vital Sign    Days of Exercise per Week: 0 days    Minutes of Exercise per Session: 0 min  Stress: Not on file  Social Connections: Not on  file  Intimate Partner Violence: Not At Risk (02/17/2020)   Humiliation, Afraid, Rape, and Kick questionnaire    Fear of Current or Ex-Partner: No    Emotionally Abused: No    Physically Abused: No    Sexually Abused: No   Family History  Problem Relation Age of Onset   Cancer Mother    Arthritis Mother    Hypotension Father    Cancer Sister    Hypotension Sister    Colon cancer Neg Hx       VITAL SIGNS BP 111/62   Pulse 90   Temp 97.6 F (36.4 C)   Resp 20   Ht 5\' 5"  (1.651 m)   Wt 240 lb 8 oz (109.1 kg)   SpO2 97%   BMI 40.02 kg/m   Outpatient Encounter Medications as of 12/11/2022  Medication Sig   acetaminophen (TYLENOL) 650 MG CR tablet Take 650 mg by mouth 2 (two) times daily.   calcium-vitamin D (OSCAL WITH D) 500-5 MG-MCG tablet Take 1 tablet by mouth daily.   Dimethicone (AVEENO DAILY MOISTURIZING) 1.25 % LOTN Apply topically daily as needed.   latanoprost (XALATAN) 0.005 % ophthalmic solution Place 1 drop into both eyes at bedtime. wait 5 minutes between multiple eye drops   loratadine (CLARITIN) 10 MG tablet Take 10 mg by mouth daily.   NON FORMULARY Diet: NAS,   omeprazole (PRILOSEC) 20 MG capsule Take 20 mg by mouth 2 (two) times daily before a meal. Special Instructions: *TAKE ON AN EMPTY STOMACH* FOR GERD *DO NOT CRUSH* (FORMULARY SUB FOR PANTOPRAZOLE 40MG )   No facility-administered encounter medications on file as of 12/11/2022.     SIGNIFICANT DIAGNOSTIC EXAMS  PREVIOUS  03-08-22: dexa scan t score -2.346   NO NEW EXAMS    LABS REVIEWED PREVIOUS;     11-21-21: d-dimer: >20 01-04-22: wbc 5.4; hgb 11.7; hct 40.9; mcv 80.2 plt 280; glucose 88 bun 14; creat 0.63; k+ 4.8; na++ 134; ca 8.8; gfr>60; protein 7.9; albumin 3.6; iron 44; tibc 308; ferritin 90  01-12-22: hgb a1c 5.3; urine micro-albumin 4.8 04-06-22: wbc 5.3; hgb 12.4; hct 39.7; mcv 82.9 plt 363; glucose 97; bun 20; creat 0.59; k+ 4.1; na++ 136; ca 8.8; gfr >60; protein 7.0 albumin 3.2; hgb  A1c 6.0; vitamin D: 19.98; urine micro-albumin <3.0 04-13-22; wbc 8.0; hgb 12.8; hct 41.7; mcv 85.1 plt 319; iron 91; tibc 266; ferritin 203; vitamin D 19.98  07-06-22: urine micro-albumin 15.6 08-11-22: wbc 5.7; hgb 11.7; hct 37.8; mcv 88.3 plt 316; glucose 86; bun 17; creat 0.62; k+ 4.1; na++ 138; ca 8.6; gfr >60  TODAY  10-12-22: wbc 5.8; hgb 12.8; hct 41.0; mcv 87.0; clump iron 46; tibc 256; ferritin 173 12-07-22; vitamin D:  19.98; hgb A1c 5.9     Review of Systems  Constitutional:  Negative for malaise/fatigue.  Respiratory:  Negative for cough and shortness of breath.   Cardiovascular:  Negative for chest pain, palpitations and leg swelling.  Gastrointestinal:  Negative for abdominal pain, constipation and heartburn.  Musculoskeletal:  Negative for back pain, joint pain and myalgias.  Skin: Negative.   Neurological:  Negative for dizziness.  Psychiatric/Behavioral:  The patient is not nervous/anxious.    Physical Exam Constitutional:      General: She is not in acute distress.    Appearance: She is well-developed. She is obese. She is not diaphoretic.  Neck:     Thyroid: No thyromegaly.  Cardiovascular:     Rate and Rhythm: Normal rate and regular rhythm.     Pulses: Normal pulses.     Heart sounds: Normal heart sounds.  Pulmonary:     Effort: Pulmonary effort is normal. No respiratory distress.     Breath sounds: Normal breath sounds.  Abdominal:     General: Bowel sounds are normal. There is no distension.     Palpations: Abdomen is soft.     Tenderness: There is no abdominal tenderness.  Musculoskeletal:     Cervical back: Neck supple.     Right lower leg: No edema.     Left lower leg: No edema.     Comments:  Contractures lower extremities       Lymphadenopathy:     Cervical: No cervical adenopathy.  Skin:    General: Skin is warm and dry.  Neurological:     Mental Status: She is alert. Mental status is at baseline.  Psychiatric:        Mood and Affect: Mood  normal.       ASSESSMENT/ PLAN:  TODAY  Hypertension associated with type 2 diabetes mellitus Vitamin D deficiency  Will continue current antihypertensive medications Will begin vitamin D 50,000 units weekly through 02-06-23    Synthia Innocent NP [facility]  call [phone]

## 2023-01-09 ENCOUNTER — Non-Acute Institutional Stay (SKILLED_NURSING_FACILITY): Payer: Medicare HMO | Admitting: Adult Health

## 2023-01-09 ENCOUNTER — Encounter: Payer: Self-pay | Admitting: Adult Health

## 2023-01-09 DIAGNOSIS — K219 Gastro-esophageal reflux disease without esophagitis: Secondary | ICD-10-CM

## 2023-01-09 DIAGNOSIS — E1151 Type 2 diabetes mellitus with diabetic peripheral angiopathy without gangrene: Secondary | ICD-10-CM

## 2023-01-09 DIAGNOSIS — E1159 Type 2 diabetes mellitus with other circulatory complications: Secondary | ICD-10-CM

## 2023-01-09 DIAGNOSIS — I152 Hypertension secondary to endocrine disorders: Secondary | ICD-10-CM

## 2023-01-09 DIAGNOSIS — D649 Anemia, unspecified: Secondary | ICD-10-CM | POA: Diagnosis not present

## 2023-01-09 NOTE — Progress Notes (Unsigned)
Location:  [facility] Room Number: 105D Place of Service:  SNF (31)   CODE STATUS: DNR  No Known Allergies  Chief Complaint  Patient presents with   Medical Management of Chronic Issues    Routine follow up   Immunizations    Discuss need for another COVID booster   Quality Metric Gaps    Eye exam due    HPI:    Past Medical History:  Diagnosis Date   Abnormal posture    Per [facility], [facility] Electronic [facility] Records [facility]    Anemia    Cognitive communication deficit    Per [facility], [facility]    Contracture of left knee    Per [facility], [facility]    Diabetes mellitus without complication (HCC)    Difficulty walking    Per [facility], [facility]    Extended spectrum beta lactamase (ESBL) resistance    Per [facility], [facility] Records [facility]    Gastroesophageal reflux disease    Per [facility], [facility] Records [facility]    Glaucoma    Gram negative sepsis (HCC)    Per [facility], [facility] Records [facility]    Hypercholesteremia    Hypertension    Hypokalemia    Infection, bacterial    Per [facility], [facility] Records [facility]    Iron deficiency anemia due to chronic blood loss 11/23/2021   Muscle weakness (generalized)    Per [facility], [facility] Records [facility]    Need for assistance with personal care    Per [facility], [facility] Records [facility]    OAB (overactive bladder)    Per [facility], [facility]    Ocular hypertension, bilateral    Per [facility], [facility]    Osteoarthritis of right knee    Per [facility], [facility] Electronic [facility] Records  System    Paraplegia [facility])    Pyelonephritis, acute    Per [facility], [facility] Records [facility]    Status post total right knee replacement    Unspecified dementia without behavioral disturbance    Per [facility], [facility] Records [facility]    Urgency of urination    Per [facility], [facility]    Wheelchair dependence    Per Ria Bush, [facility] Records [facility]     Past Surgical History:  Procedure Laterality Date   ABDOMINAL HYSTERECTOMY     CATARACT EXTRACTION     COLONOSCOPY N/A 12/10/2013   West Bali, MD ,Gi revealed redundant colon   TOTAL KNEE ARTHROPLASTY Right 03/30/2016   Procedure: TOTAL KNEE ARTHROPLASTY;  Surgeon: Vickki Hearing, MD;  Location: [facility];  Service: Orthopedics;  Laterality: Right;    Social History   Socioeconomic History   Marital status: Divorced    Spouse name: Not on file   Number of children: Not on file   Years of education: Not on file   Highest education level: Not on file  Occupational History   Occupation: retired   Tobacco Use   Smoking status: Former    Packs/day: 0.25    Years: 3.00    Additional pack years: 0.00    Total pack years: 0.75  Types: Cigarettes   Smokeless tobacco: Never  Vaping Use   Vaping Use: Never used  Substance and Sexual Activity   Alcohol use: No   Drug use: No   Sexual activity: Not Currently    Birth control/protection: Post-menopausal  Other Topics Concern   Not on file  Social History Narrative   Long term resident of [facility]    Social Determinants of Health   Financial Resource Strain: Not on file  Food Insecurity: Not on file  Transportation Needs: Not on file  Physical Activity: Inactive (02/17/2020)   Exercise Vital Sign    Days of Exercise per Week: 0 days    Minutes of Exercise per Session: 0 min  Stress: Not on file  Social Connections: Not on file  Intimate  Partner Violence: Not At Risk (02/17/2020)   Humiliation, Afraid, Rape, and Kick questionnaire    Fear of Current or Ex-Partner: No    Emotionally Abused: No    Physically Abused: No    Sexually Abused: No   Family History  Problem Relation Age of Onset   Cancer Mother    Arthritis Mother    Hypotension Father    Cancer Sister    Hypotension Sister    Colon cancer Neg Hx       VITAL SIGNS BP (!) 138/99   Pulse (!) 101   Temp 98.6 F (37 C)   Resp 14   Ht 5\' 5"  (1.651 m)   Wt 244 lb (110.7 kg)   SpO2 92%   BMI 40.60 kg/m   Outpatient Encounter Medications as of 01/09/2023  Medication Sig   acetaminophen (TYLENOL) 650 MG CR tablet Take 650 mg by mouth 2 (two) times daily.   calcium-vitamin D (OSCAL WITH D) 500-5 MG-MCG tablet Take 1 tablet by mouth daily.   latanoprost (XALATAN) 0.005 % ophthalmic solution Place 1 drop into both eyes at bedtime. wait 5 minutes between multiple eye drops   loratadine (CLARITIN) 10 MG tablet Take 10 mg by mouth daily.   NON FORMULARY Diet: NAS,   omeprazole (PRILOSEC) 20 MG capsule Take 20 mg by mouth 2 (two) times daily before a meal. Special Instructions: *TAKE ON AN EMPTY STOMACH* FOR GERD *DO NOT CRUSH* (FORMULARY SUB FOR PANTOPRAZOLE 40MG )   Vitamin D, Ergocalciferol, (DRISDOL) 1.25 MG (50000 UNIT) CAPS capsule Take 50,000 Units by mouth every 7 (seven) days.   [DISCONTINUED] Dimethicone (AVEENO DAILY MOISTURIZING) 1.25 % LOTN Apply topically daily as needed.   No facility-administered encounter medications on file as of 01/09/2023.     SIGNIFICANT DIAGNOSTIC EXAMS       ASSESSMENT/ PLAN:     Synthia Innocent NP [facility]  Contact [phone] Monday through Friday 8am- 5pm  After hours call [phone]

## 2023-01-17 ENCOUNTER — Encounter: Payer: Self-pay | Admitting: Internal Medicine

## 2023-01-17 ENCOUNTER — Non-Acute Institutional Stay (SKILLED_NURSING_FACILITY): Payer: Medicare HMO | Admitting: Internal Medicine

## 2023-01-17 DIAGNOSIS — E1151 Type 2 diabetes mellitus with diabetic peripheral angiopathy without gangrene: Secondary | ICD-10-CM

## 2023-01-17 DIAGNOSIS — M24561 Contracture, right knee: Secondary | ICD-10-CM

## 2023-01-17 DIAGNOSIS — L308 Other specified dermatitis: Secondary | ICD-10-CM | POA: Diagnosis not present

## 2023-01-17 DIAGNOSIS — J301 Allergic rhinitis due to pollen: Secondary | ICD-10-CM

## 2023-01-17 NOTE — Assessment & Plan Note (Addendum)
Short course of prednisone 10 mg bid will be ordered for her wheezing subjectively and rhinitis symptoms.  This should not impact her diabetes as the A1c is 5.9%.

## 2023-01-17 NOTE — Assessment & Plan Note (Signed)
Current A1c is 5.9% indicating prediabetes.  Short course of prednisone will be ordered for her subjective wheezing and rhinitis symptoms. The burst of steroids should not adversely affect impact diabetic control, but glucoses will be monitored.

## 2023-01-17 NOTE — Progress Notes (Unsigned)
NURSING HOME LOCATION:  [facility] ROOM NUMBER: [id number]  CODE STATUS: Full code  PCP:  Synthia Innocent NP  This is a nursing facility follow up visit for specific acute issue of self-reported wheezing.  Interim medical record and care since last SNF visit was updated with review of diagnostic studies and change in clinical status since last visit were documented.  HPI: She is a permanent resident of this facility with diagnoses of chronic anemia, GERD, dyslipidemia, essential hypertension, and dementia without behavioral disturbances.  She denies a history of asthma; she states she quit smoking 5 years ago. She is somewhat vague as to when the symptoms started.  Symptoms of wheezing may have started 1 week ago.  There was no definite trigger although she states that she wheezes if "I get hot."  She describes rhinitis with clear drainage.  There are no upper respiratory tract infection symptoms.  She denies other extrinsic symptoms.  She does have loratadine ordered as needed.   She has history of GERD but denies active reflux symptoms at this time. Current A1c is 5.9% indicating prediabetes.  White count and differential of not revealed any eosinophilia.  Chemistries have been normal except for GFR greater than 60 and mild hypocalcemia.  Review of systems: She does Describe some pruritic dermatitis in the inguinal area.  Constitutional: No fever, significant weight change, fatigue  Eyes: No redness, discharge, pain, vision change ENT/mouth: No nasal congestion,  purulent discharge, earache, change in hearing, sore throat  Cardiovascular: No chest pain, palpitations, paroxysmal nocturnal dyspnea, claudication, edema  Respiratory: No cough, sputum production, hemoptysis, DOE, significant snoring, apnea   Gastrointestinal: No heartburn, dysphagia, abdominal pain, nausea /vomiting, rectal bleeding, melena, change in bowels Genitourinary: No dysuria, hematuria, pyuria,  incontinence, nocturia Musculoskeletal: No joint stiffness, joint swelling, weakness, pain Dermatologic: No rash, pruritus, change in appearance of skin Neurologic: No dizziness, headache, syncope, seizures, numbness, tingling Psychiatric: No significant anxiety, depression, insomnia, anorexia Endocrine: No change in hair/skin/nails, excessive thirst, excessive hunger, excessive urination  Hematologic/lymphatic: No significant bruising, lymphadenopathy, abnormal bleeding Allergy/immunology: No itchy/watery eyes, significant sneezing, urticaria, angioedema  Physical exam:  Pertinent or positive findings: She is morbidly obese.  She is hard of hearing.  There is erythema of the left nasal septum.  She is edentulous.  Her speech is somewhat slurred.  Slight tachycardia at rest is present.  Respirations are shallow with decreased breath sounds.  Abdomen is markedly protuberant.  She has 1+ edema of the lower extremities.  Pedal pulses are decreased.  There is decreased range of motion at the right knee. General appearance: Adequately nourished; no acute distress, increased work of breathing is present.   Lymphatic: No lymphadenopathy about the head, neck, axilla. Eyes: No conjunctival inflammation or lid edema is present. There is no scleral icterus. Ears:  External ear exam shows no significant lesions or deformities.   Nose:  External nasal examination shows no deformity or inflammation. Nasal mucosa are pink and moist without lesions, exudates Oral exam:  Lips and gums are healthy appearing. There is no oropharyngeal erythema or exudate. Neck:  No thyromegaly, masses, tenderness noted.    Heart:  Normal rate and regular rhythm. S1 and S2 normal without gallop, murmur, click, rub .  Lungs: Chest clear to auscultation without wheezes, rhonchi, rales, rubs. Abdomen: Bowel sounds are normal. Abdomen is soft and nontender with no organomegaly, hernias, masses. GU: Deferred  Extremities:  No cyanosis,  clubbing, edema  Neurologic exam : Cn 2-7  intact Strength equal  in upper & lower extremities Balance, Rhomberg, finger to nose testing could not be completed due to clinical state Deep tendon reflexes are equal Skin: Warm & dry w/o tenting. No significant lesions or rash.  See summary under each active problem in the Problem List with associated updated therapeutic plan

## 2023-01-17 NOTE — Patient Instructions (Signed)
See assessment and plan under each diagnosis in the problem list and acutely for this visit 

## 2023-01-18 NOTE — Assessment & Plan Note (Signed)
Decreased range of motion right knee on exam.  She is wheelchair dependent.

## 2023-02-09 ENCOUNTER — Non-Acute Institutional Stay (SKILLED_NURSING_FACILITY): Payer: Medicare HMO | Admitting: Adult Health

## 2023-02-09 ENCOUNTER — Encounter: Payer: Self-pay | Admitting: Adult Health

## 2023-02-09 DIAGNOSIS — I152 Hypertension secondary to endocrine disorders: Secondary | ICD-10-CM

## 2023-02-09 DIAGNOSIS — E1159 Type 2 diabetes mellitus with other circulatory complications: Secondary | ICD-10-CM

## 2023-02-09 DIAGNOSIS — F01B0 Vascular dementia, moderate, without behavioral disturbance, psychotic disturbance, mood disturbance, and anxiety: Secondary | ICD-10-CM | POA: Diagnosis not present

## 2023-02-09 DIAGNOSIS — I700 Atherosclerosis of aorta: Secondary | ICD-10-CM | POA: Diagnosis not present

## 2023-02-09 NOTE — Progress Notes (Signed)
Location:  [facility] Room Number: 105 Place of Service:  SNF (31)   CODE STATUS: dnr   No Known Allergies  Chief Complaint  Patient presents with   Acute Visit    Care plan meeting.     HPI:  We have come together for her care plan meeting. BIMS 12/15 mood 2/30: some depression. Is wheelchair bound; no falls. She requires maximum assist with her adls. She is incontinent of bladder and bowel. Dietary: regular diet: appetite 75-100% meals; requires setup for meals. Therapy: none at this time. Activities: is involved. She continues to be followed for her chronic illnesses including:    Aortic atherosclerosis    Hypertension associated with type 2 diabetes mellitus   Moderate vascular dementia without behavioral disturbance , psychotic disturbance, mood disorder or anxiety.   Past Medical History:  Diagnosis Date   Abnormal posture    Per [facility], [facility] Electronic [facility] Records [facility]    Anemia    Cognitive communication deficit    Per [facility], [facility] Records [facility]    Contracture of left knee    Per [facility], [facility] Records [facility]    Diabetes mellitus without complication (HCC)    Difficulty walking    Per [facility], [facility] Records [facility]    Extended spectrum beta lactamase (ESBL) resistance    Per [facility], [facility] Records [facility]    Gastroesophageal reflux disease    Per [facility], [facility] Records [facility]    Glaucoma    Gram negative sepsis (HCC)    Per [facility], [facility] Records [facility]    Hypercholesteremia    Hypertension    Hypokalemia    Infection, bacterial    Per [facility], [facility] Records [facility]    Iron deficiency anemia due to chronic blood loss 11/23/2021   Muscle weakness (generalized)    Per [facility], [facility]    Need for assistance with personal care    Per [facility], [facility] Records [facility]    OAB (overactive bladder)    Per [facility], [facility] Records [facility]    Ocular hypertension, bilateral    Per [facility], [facility] Records [facility]    Osteoarthritis of right knee    Per [facility], [facility] Records [facility]    Paraplegia [facility])    Pyelonephritis, acute    Per [facility], [facility] Records [facility]    Status post total right knee replacement    Unspecified dementia without behavioral disturbance    Per [facility], [facility] Records [facility]    Urgency of urination    Per [facility], [facility] Records [facility]    Wheelchair dependence    Per Ria Bush, [facility] Electronic [facility] Records [facility]     Past Surgical History:  Procedure Laterality Date   ABDOMINAL HYSTERECTOMY     CATARACT EXTRACTION     COLONOSCOPY N/A 12/10/2013   West Bali, MD ,Gi revealed redundant colon   TOTAL KNEE ARTHROPLASTY Right 03/30/2016   Procedure: TOTAL KNEE ARTHROPLASTY;  Surgeon: Vickki Hearing, MD;  Location: [facility];  Service: Orthopedics;  Laterality: Right;    Social History   Socioeconomic History   Marital status: Divorced    Spouse  name: Not on file   Number of children: Not on file   Years of education: Not on file   Highest education level: Not on file  Occupational History   Occupation: retired   Tobacco Use   Smoking status: Former    Current packs/day: 0.25    Average packs/day: 0.3 packs/day for 3.0 years (0.8 ttl pk-yrs)    Types: Cigarettes   Smokeless tobacco: Never  Vaping Use   Vaping status: Never Used  Substance and Sexual Activity   Alcohol use: No   Drug use: No   Sexual activity: Not Currently    Birth  control/protection: Post-menopausal  Other Topics Concern   Not on file  Social History Narrative   Long term resident of [facility]    Social Determinants of Health   Financial Resource Strain: Not on file  Food Insecurity: Not on file  Transportation Needs: Not on file  Physical Activity: Inactive (02/17/2020)   Exercise Vital Sign    Days of Exercise per Week: 0 days    Minutes of Exercise per Session: 0 min  Stress: Not on file  Social Connections: Not on file  Intimate Partner Violence: Not At Risk (02/17/2020)   Humiliation, Afraid, Rape, and Kick questionnaire    Fear of Current or Ex-Partner: No    Emotionally Abused: No    Physically Abused: No    Sexually Abused: No   Family History  Problem Relation Age of Onset   Cancer Mother    Arthritis Mother    Hypotension Father    Cancer Sister    Hypotension Sister    Colon cancer Neg Hx       VITAL SIGNS BP (!) 142/80   Pulse 89   Temp 98.1 F (36.7 C)   Resp 20   SpO2 96%   Outpatient Encounter Medications as of 02/09/2023  Medication Sig   acetaminophen (TYLENOL) 650 MG CR tablet Take 650 mg by mouth 2 (two) times daily.   calcium-vitamin D (OSCAL WITH D) 500-5 MG-MCG tablet Take 1 tablet by mouth daily.   latanoprost (XALATAN) 0.005 % ophthalmic solution Place 1 drop into both eyes at bedtime. wait 5 minutes between multiple eye drops   loratadine (CLARITIN) 10 MG tablet Take 10 mg by mouth daily.   NON FORMULARY Diet: NAS,   omeprazole (PRILOSEC) 20 MG capsule Take 20 mg by mouth 2 (two) times daily before a meal. Special Instructions: *TAKE ON AN EMPTY STOMACH* FOR GERD *DO NOT CRUSH* (FORMULARY SUB FOR PANTOPRAZOLE 40MG )   Vitamin D, Ergocalciferol, (DRISDOL) 1.25 MG (50000 UNIT) CAPS capsule Take 50,000 Units by mouth every 7 (seven) days.   No facility-administered encounter medications on file as of 02/09/2023.     SIGNIFICANT DIAGNOSTIC EXAMS  PREVIOUS  03-08-22: dexa scan t score -2.346   NO NEW  EXAMS    LABS REVIEWED PREVIOUS;     04-06-22: wbc 5.3; hgb 12.4; hct 39.7; mcv 82.9 plt 363; glucose 97; bun 20; creat 0.59; k+ 4.1; na++ 136; ca 8.8; gfr >60; protein 7.0 albumin 3.2; hgb A1c 6.0; vitamin D: 19.98; urine micro-albumin <3.0 04-13-22; wbc 8.0; hgb 12.8; hct 41.7; mcv 85.1 plt 319; iron 91; tibc 266; ferritin 203; vitamin D 19.98  07-06-22: urine micro-albumin 15.6 08-11-22: wbc 5.7; hgb 11.7; hct 37.8; mcv 88.3 plt 316; glucose 86; bun 17; creat 0.62; k+ 4.1; na++ 138; ca 8.6; gfr >60 10-12-22: wbc 5.8; hgb 12.8; hct 41.0; mcv 87.0; clump iron 46; tibc 256;  ferritin 173 12-07-22; vitamin D: 19.98; hgb A1c 5.9   NO NEW LABS.    Review of Systems  Constitutional:  Negative for malaise/fatigue.  Respiratory:  Negative for cough and shortness of breath.   Cardiovascular:  Negative for chest pain, palpitations and leg swelling.  Gastrointestinal:  Negative for abdominal pain, constipation and heartburn.  Musculoskeletal:  Negative for back pain, joint pain and myalgias.  Skin: Negative.   Neurological:  Negative for dizziness.  Psychiatric/Behavioral:  The patient is not nervous/anxious.     Physical Exam Constitutional:      General: She is not in acute distress.    Appearance: She is well-developed. She is morbidly obese. She is not diaphoretic.  Neck:     Thyroid: No thyromegaly.  Cardiovascular:     Rate and Rhythm: Normal rate and regular rhythm.     Pulses: Normal pulses.     Heart sounds: Normal heart sounds.  Pulmonary:     Effort: Pulmonary effort is normal. No respiratory distress.     Breath sounds: Normal breath sounds.  Abdominal:     General: Bowel sounds are normal. There is no distension.     Palpations: Abdomen is soft.     Tenderness: There is no abdominal tenderness.  Musculoskeletal:     Cervical back: Neck supple.     Right lower leg: No edema.     Left lower leg: No edema.     Comments: Contractures lower extremities       Lymphadenopathy:      Cervical: No cervical adenopathy.  Skin:    General: Skin is warm and dry.  Neurological:     Mental Status: She is alert. Mental status is at baseline.  Psychiatric:        Mood and Affect: Mood normal.       ASSESSMENT/ PLAN:  TODAY  Aortic atherosclerosis Hypertension associated with type 2 diabetes mellitus Moderate vascular dementia without behavioral disturbance , psychotic disturbance, mood disorder or anxiety.    Will continue current medications Will continue current plan of care Will continue to monitor her status.    Time spent with patient 35 minutes: medications; care plan dietary.    Synthia Innocent NP [facility]  call [phone]

## 2023-02-27 ENCOUNTER — Non-Acute Institutional Stay (SKILLED_NURSING_FACILITY): Payer: Medicare HMO | Admitting: Adult Health

## 2023-02-27 ENCOUNTER — Encounter: Payer: Self-pay | Admitting: Adult Health

## 2023-02-27 DIAGNOSIS — F01B0 Vascular dementia, moderate, without behavioral disturbance, psychotic disturbance, mood disturbance, and anxiety: Secondary | ICD-10-CM

## 2023-02-27 DIAGNOSIS — E559 Vitamin D deficiency, unspecified: Secondary | ICD-10-CM | POA: Diagnosis not present

## 2023-02-27 DIAGNOSIS — N3281 Overactive bladder: Secondary | ICD-10-CM

## 2023-02-27 NOTE — Progress Notes (Signed)
Location:  [facility] Room Number: 105D Place of Service:  SNF (31)   CODE STATUS: Full Code   No Known Allergies  Chief Complaint  Patient presents with   Medical Management of Chronic Issues                Vitamin D deficiency:     Urinary urgency:    Vascular dementia without behavioral disturbance     HPI:  She is a 88 year old long term resident of this facility being seen for the management of her chronic illnesses:  Vitamin D deficiency:     Urinary urgency:    Vascular dementia without behavioral disturbance. There are no reports of uncontrolled pain. She is completely incontinent of bladder and bowel.   Past Medical History:  Diagnosis Date   Abnormal posture    Per [facility], [facility] Electronic [facility] Records [facility]    Anemia    Cognitive communication deficit    Per [facility], [facility]    Contracture of left knee    Per [facility], [facility]    Diabetes mellitus without complication (HCC)    Difficulty walking    Per [facility], [facility] Records [facility]    Extended spectrum beta lactamase (ESBL) resistance    Per [facility], [facility] Records [facility]    Gastroesophageal reflux disease    Per [facility], [facility]    Glaucoma    Gram negative sepsis (HCC)    Per [facility], [facility] Records [facility]    Hypercholesteremia    Hypertension    Hypokalemia    Infection, bacterial    Per [facility], [facility] Records [facility]    Iron deficiency anemia due to chronic blood loss 11/23/2021   Muscle weakness (generalized)    Per [facility], [facility] Records [facility]    Need for assistance with personal care    Per [facility], [facility] Records [facility]     OAB (overactive bladder)    Per [facility], [facility]    Ocular hypertension, bilateral    Per [facility], [facility]    Osteoarthritis of right knee    Per [facility], [facility]    Paraplegia [facility])    Pyelonephritis, acute    Per [facility], [facility] Records [facility]    Status post total right knee replacement    Unspecified dementia without behavioral disturbance    Per [facility], [facility]    Urgency of urination    Per [facility], [facility]    Wheelchair dependence    Per Ria [facility], [facility] Records [facility]     Past Surgical History:  Procedure Laterality Date   ABDOMINAL HYSTERECTOMY     CATARACT EXTRACTION     COLONOSCOPY N/A 12/10/2013   West Bali, MD ,Gi revealed redundant colon   TOTAL KNEE ARTHROPLASTY Right 03/30/2016   Procedure: TOTAL KNEE ARTHROPLASTY;  Surgeon: Vickki Hearing, MD;  Location: [facility];  Service: Orthopedics;  Laterality: Right;    Social History   Socioeconomic History   Marital status: Divorced    Spouse name: Not on file   Number of children:  Not on file   Years of education: Not on file   Highest education level: Not on file  Occupational History   Occupation: retired   Tobacco Use   Smoking status: Former    Current packs/day: 0.25    Average packs/day: 0.3 packs/day for 3.0 years (0.8 ttl pk-yrs)    Types: Cigarettes   Smokeless tobacco: Never  Vaping Use   Vaping status: Never Used  Substance and Sexual Activity   Alcohol use: No   Drug use: No   Sexual activity: Not Currently    Birth control/protection: Post-menopausal  Other Topics Concern   Not on file  Social History Narrative   Long term resident of [facility]    Social Determinants of Health   Financial  Resource Strain: Not on file  Food Insecurity: Not on file  Transportation Needs: Not on file  Physical Activity: Inactive (02/17/2020)   Exercise Vital Sign    Days of Exercise per Week: 0 days    Minutes of Exercise per Session: 0 min  Stress: Not on file  Social Connections: Not on file  Intimate Partner Violence: Not At Risk (02/17/2020)   Humiliation, Afraid, Rape, and Kick questionnaire    Fear of Current or Ex-Partner: No    Emotionally Abused: No    Physically Abused: No    Sexually Abused: No   Family History  Problem Relation Age of Onset   Cancer Mother    Arthritis Mother    Hypotension Father    Cancer Sister    Hypotension Sister    Colon cancer Neg Hx       VITAL SIGNS BP 125/70   Pulse 74   Temp 98.6 F (37 C) (Temporal)   Resp (!) 24   Ht 5\' 5"  (1.651 m)   Wt 240 lb (108.9 kg)   SpO2 98%   BMI 39.94 kg/m   Outpatient Encounter Medications as of 02/27/2023  Medication Sig   acetaminophen (TYLENOL) 650 MG CR tablet Take 650 mg by mouth 2 (two) times daily.   calcium-vitamin D (OSCAL WITH D) 500-5 MG-MCG tablet Take 1 tablet by mouth daily.   latanoprost (XALATAN) 0.005 % ophthalmic solution Place 1 drop into both eyes at bedtime. wait 5 minutes between multiple eye drops   loratadine (CLARITIN) 10 MG tablet Take 10 mg by mouth daily.   NON FORMULARY Diet: NAS,   omeprazole (PRILOSEC) 20 MG capsule Take 20 mg by mouth 2 (two) times daily before a meal. Special Instructions: *TAKE ON AN EMPTY STOMACH* FOR GERD *DO NOT CRUSH* (FORMULARY SUB FOR PANTOPRAZOLE 40MG )   Vitamin D, Ergocalciferol, (DRISDOL) 1.25 MG (50000 UNIT) CAPS capsule Take 50,000 Units by mouth every 7 (seven) days.   No facility-administered encounter medications on file as of 02/27/2023.     SIGNIFICANT DIAGNOSTIC EXAMS  PREVIOUS  03-08-22: dexa scan t score -2.346   NO NEW EXAMS    LABS REVIEWED PREVIOUS;     04-06-22: wbc 5.3; hgb 12.4; hct 39.7; mcv 82.9 plt 363; glucose 97;  bun 20; creat 0.59; k+ 4.1; na++ 136; ca 8.8; gfr >60; protein 7.0 albumin 3.2; hgb A1c 6.0; vitamin D: 19.98; urine micro-albumin <3.0 04-13-22; wbc 8.0; hgb 12.8; hct 41.7; mcv 85.1 plt 319; iron 91; tibc 266; ferritin 203; vitamin D 19.98  07-06-22: urine micro-albumin 15.6 08-11-22: wbc 5.7; hgb 11.7; hct 37.8; mcv 88.3 plt 316; glucose 86; bun 17; creat 0.62; k+ 4.1; na++ 138; ca 8.6; gfr >60 10-12-22: wbc 5.8;  hgb 12.8; hct 41.0; mcv 87.0; clump iron 46; tibc 256; ferritin 173 12-07-22; vitamin D: 19.98; hgb A1c 5.9   NO NEW LABS.    Review of Systems  Constitutional:  Negative for malaise/fatigue.  Respiratory:  Negative for cough and shortness of breath.   Cardiovascular:  Negative for chest pain, palpitations and leg swelling.  Gastrointestinal:  Negative for abdominal pain, constipation and heartburn.  Musculoskeletal:  Negative for back pain, joint pain and myalgias.  Skin: Negative.   Neurological:  Negative for dizziness.  Psychiatric/Behavioral:  The patient is not nervous/anxious.     Physical Exam Constitutional:      General: She is not in acute distress.    Appearance: She is well-developed. She is morbidly obese. She is not diaphoretic.  Neck:     Thyroid: No thyromegaly.  Cardiovascular:     Rate and Rhythm: Normal rate and regular rhythm.     Pulses: Normal pulses.     Heart sounds: Normal heart sounds.  Pulmonary:     Effort: Pulmonary effort is normal. No respiratory distress.     Breath sounds: Normal breath sounds.  Abdominal:     General: Bowel sounds are normal. There is no distension.     Palpations: Abdomen is soft.     Tenderness: There is no abdominal tenderness.  Musculoskeletal:     Cervical back: Neck supple.     Right lower leg: No edema.     Left lower leg: No edema.     Comments:  Contractures lower extremities        Lymphadenopathy:     Cervical: No cervical adenopathy.  Skin:    General: Skin is warm and dry.  Neurological:     Mental  Status: She is alert. Mental status is at baseline.  Psychiatric:        Mood and Affect: Mood normal.      ASSESSMENT/ PLAN:  TODAY;   Vitamin D deficiency: level 19.98 will continue vitamin D 50,000 units weekly  2. Urinary urgency: is completely incontinent off myrbetriq  3. Vascular dementia without behavioral disturbance: weight is 240 pounds.   PREVIOUS   4. Aortic atherosclerosis (ct 09-03-19)  5. Hypokalemia: k+ 4.1 will monitor   6. Increased ocular pressure bilateral: will continue xalatan to both eyes  7. Primary osteoarthritis right knee: will continue tylenol 650 mg twice daily   8. Unspecified protein calorie malnutrition: albumin 3.2 protein 7.0   9. Allergic rhinitis: will continue claritin 10 mg daily   10. Hypertension associated with type 2 diabetes mellitus: b/p 125/70 will continue asa 81 mg daily   11.  Type 2 diabetes mellitus with peripheral vascular complications: hgb A1c 5.9  will monitor   12. Chronic anemia: hgb 11.7 will monitor is off iron.   13. GERD without esophagitis: will continue prilosec 20 mg twice daily      Synthia Innocent NP [facility]  call [phone]

## 2023-03-01 ENCOUNTER — Other Ambulatory Visit (HOSPITAL_COMMUNITY)
Admission: RE | Admit: 2023-03-01 | Discharge: 2023-03-01 | Disposition: A | Discharge status: Home / Self Care | Payer: Medicare HMO | Source: Skilled Nursing Facility | Attending: Adult Health | Admitting: Adult Health

## 2023-03-01 DIAGNOSIS — E1122 Type 2 diabetes mellitus with diabetic chronic kidney disease: Secondary | ICD-10-CM | POA: Diagnosis present

## 2023-03-01 LAB — COMPREHENSIVE METABOLIC PANEL
ALT: 9 U/L (ref 0–44)
AST: 10 U/L — ABNORMAL LOW (ref 15–41)
Albumin: 2.9 g/dL — ABNORMAL LOW (ref 3.5–5.0)
Alkaline Phosphatase: 75 U/L (ref 38–126)
Anion gap: 8 (ref 5–15)
BUN: 13 mg/dL (ref 8–23)
CO2: 26 mmol/L (ref 22–32)
Calcium: 8.4 mg/dL — ABNORMAL LOW (ref 8.9–10.3)
Chloride: 104 mmol/L (ref 98–111)
Creatinine, Ser: 0.56 mg/dL (ref 0.44–1.00)
GFR, Estimated: >60 mL/min (ref >60)
Glucose, Bld: 93 mg/dL (ref 70–99)
Potassium: 3.6 mmol/L (ref 3.5–5.1)
Sodium: 138 mmol/L (ref 135–145)
Total Bilirubin: 0.3 mg/dL (ref 0.3–1.2)
Total Protein: 6.5 g/dL (ref 6.5–8.1)

## 2023-03-01 LAB — HEMOGLOBIN A1C
Hgb A1c MFr Bld: 6.1 % — ABNORMAL HIGH (ref 4.8–5.6)
Mean Plasma Glucose: 128.37 mg/dL

## 2023-03-03 ENCOUNTER — Other Ambulatory Visit (HOSPITAL_COMMUNITY)
Admission: RE | Admit: 2023-03-03 | Discharge: 2023-03-03 | Disposition: A | Discharge status: Home / Self Care | Payer: Medicare HMO | Source: Skilled Nursing Facility | Attending: Internal Medicine | Admitting: Internal Medicine

## 2023-03-03 DIAGNOSIS — U071 COVID-19: Secondary | ICD-10-CM | POA: Insufficient documentation

## 2023-03-03 LAB — COMPREHENSIVE METABOLIC PANEL
ALT: 12 U/L (ref 0–44)
AST: 17 U/L (ref 15–41)
Albumin: 3.2 g/dL — ABNORMAL LOW (ref 3.5–5.0)
Alkaline Phosphatase: 84 U/L (ref 38–126)
Anion gap: 9 (ref 5–15)
BUN: 13 mg/dL (ref 8–23)
CO2: 26 mmol/L (ref 22–32)
Calcium: 8.7 mg/dL — ABNORMAL LOW (ref 8.9–10.3)
Chloride: 99 mmol/L (ref 98–111)
Creatinine, Ser: 0.69 mg/dL (ref 0.44–1.00)
GFR, Estimated: >60 mL/min (ref >60)
Glucose, Bld: 155 mg/dL — ABNORMAL HIGH (ref 70–99)
Potassium: 3.9 mmol/L (ref 3.5–5.1)
Sodium: 134 mmol/L — ABNORMAL LOW (ref 135–145)
Total Bilirubin: 0.5 mg/dL (ref 0.3–1.2)
Total Protein: 6.7 g/dL (ref 6.5–8.1)

## 2023-03-03 LAB — CBC
HCT: 36.7 % (ref 36.0–46.0)
Hemoglobin: 11.4 g/dL — ABNORMAL LOW (ref 12.0–15.0)
MCH: 26.2 pg (ref 26.0–34.0)
MCHC: 31.1 g/dL (ref 30.0–36.0)
MCV: 84.4 fL (ref 80.0–100.0)
Platelets: 336 10*3/uL (ref 150–400)
RBC: 4.35 MIL/uL (ref 3.87–5.11)
RDW: 14 % (ref 11.5–15.5)
WBC: 5.2 10*3/uL (ref 4.0–10.5)
nRBC: 0 % (ref 0.0–0.2)

## 2023-03-03 LAB — D-DIMER, QUANTITATIVE: D-Dimer, Quant: >20 ug/mL-FEU — ABNORMAL HIGH (ref 0.00–0.50)

## 2023-03-03 LAB — C-REACTIVE PROTEIN: CRP: 14.6 mg/dL — ABNORMAL HIGH (ref <1.0)

## 2023-03-05 ENCOUNTER — Encounter: Payer: Self-pay | Admitting: Adult Health

## 2023-03-05 ENCOUNTER — Non-Acute Institutional Stay: Payer: Self-pay | Admitting: Adult Health

## 2023-03-05 DIAGNOSIS — D6869 Other thrombophilia: Secondary | ICD-10-CM

## 2023-03-05 DIAGNOSIS — E119 Type 2 diabetes mellitus without complications: Secondary | ICD-10-CM | POA: Diagnosis not present

## 2023-03-05 DIAGNOSIS — U071 COVID-19: Secondary | ICD-10-CM

## 2023-03-05 DIAGNOSIS — J1282 Pneumonia due to coronavirus disease 2019: Secondary | ICD-10-CM | POA: Diagnosis not present

## 2023-03-05 NOTE — Progress Notes (Unsigned)
Location:  [facility] Room Number: 105 Place of Service:  SNF (31)   CODE STATUS: full   No Known Allergies  Chief Complaint  Patient presents with   Acute Visit    COVID +     HPI:  She has tested positive for covid. She had some nasal congestion. She has had chronic elevated d-dimer >20. She has been seen by hematology for this in the past however; she does have acute covid; will need eliquis for short term. Her CRP is elevated as well. She will need a burst of prednisone; which was started over the weekend. She was found to have right lower lobe pneumonia. She has been started on doxycycline. She did run  low grade fever over the past weekend.   Past Medical History:  Diagnosis Date   Abnormal posture    Per [employer], [facility] Electronic [facility] Records System    Anemia    Cognitive communication deficit    Per [employer], [facility] Records [facility]    Contracture of left knee    Per [employer], [facility] Records [facility]    Diabetes mellitus without complication (HCC)    Difficulty walking    Per [facility], [facility] Records [facility]    Extended spectrum beta lactamase (ESBL) resistance    Per [facility], [facility] Records [facility]    Gastroesophageal reflux disease    Per [facility], [facility] Records [facility]    Glaucoma    Gram negative sepsis (HCC)    Per [facility], [facility] Records [facility]    Hypercholesteremia    Hypertension    Hypokalemia    Infection, bacterial    Per [facility], [facility] Records [facility]    Iron deficiency anemia due to chronic blood loss 11/23/2021   Muscle weakness (generalized)    Per [facility], [facility] Records [facility]    Need for assistance with personal care    Per [facility], [facility]  Electronic Medical Records System    OAB (overactive bladder)    Per [facility], [facility] Records [facility]    Ocular hypertension, bilateral    Per [facility], [facility] Records [facility]    Osteoarthritis of right knee    Per [facility], [facility] Records [facility]    Paraplegia [facility])    Pyelonephritis, acute    Per [facility], [facility] Medical Records [facility]    Status post total right knee replacement    Unspecified dementia without behavioral disturbance    Per [facility], [facility] Records [facility]    Urgency of urination    Per [employer], [facility] Records [facility]    Wheelchair dependence    Per Ria Bush, [facility] Electronic Medical Records System     Past Surgical History:  Procedure Laterality Date   ABDOMINAL HYSTERECTOMY     CATARACT EXTRACTION     COLONOSCOPY N/A 12/10/2013   West Bali, MD ,Gi revealed redundant colon   TOTAL KNEE ARTHROPLASTY Right 03/30/2016   Procedure: TOTAL KNEE ARTHROPLASTY;  Surgeon: Vickki Hearing, MD;  Location: [facility];  Service: Orthopedics;  Laterality: Right;    Social History   Socioeconomic History   Marital status: Divorced    Spouse name: Not on file   Number  of children: Not on file   Years of education: Not on file   Highest education level: Not on file  Occupational History   Occupation: retired   Tobacco Use   Smoking status: Former    Current packs/day: 0.25    Average packs/day: 0.3 packs/day for 3.0 years (0.8 ttl pk-yrs)    Types: Cigarettes   Smokeless tobacco: Never  Vaping Use   Vaping status: Never Used  Substance and Sexual Activity   Alcohol use: No   Drug use: No   Sexual activity: Not Currently    Birth control/protection: Post-menopausal  Other Topics Concern   Not on file  Social History Narrative   Long term resident of [facility]    Social  Determinants of Health   Financial Resource Strain: Not on file  Food Insecurity: Not on file  Transportation Needs: Not on file  Physical Activity: Inactive (02/17/2020)   Exercise Vital Sign    Days of Exercise per Week: 0 days    Minutes of Exercise per Session: 0 min  Stress: Not on file  Social Connections: Not on file  Intimate Partner Violence: Not At Risk (02/17/2020)   Humiliation, Afraid, Rape, and Kick questionnaire    Fear of Current or Ex-Partner: No    Emotionally Abused: No    Physically Abused: No    Sexually Abused: No   Family History  Problem Relation Age of Onset   Cancer Mother    Arthritis Mother    Hypotension Father    Cancer Sister    Hypotension Sister    Colon cancer Neg Hx       VITAL SIGNS BP (!) 145/83   Pulse 100   Temp (!) 97 F (36.1 C)   Resp 20   Ht 5\' 5"  (1.651 m)   Wt 240 lb 6.4 oz (109 kg)   SpO2 94%   BMI 40.00 kg/m   Outpatient Encounter Medications as of 03/05/2023  Medication Sig   acetaminophen (TYLENOL) 650 MG CR tablet Take 650 mg by mouth 2 (two) times daily.   calcium-vitamin D (OSCAL WITH D) 500-5 MG-MCG tablet Take 1 tablet by mouth daily.   latanoprost (XALATAN) 0.005 % ophthalmic solution Place 1 drop into both eyes at bedtime. wait 5 minutes between multiple eye drops   loratadine (CLARITIN) 10 MG tablet Take 10 mg by mouth daily.   NON FORMULARY Diet: NAS,   omeprazole (PRILOSEC) 20 MG capsule Take 20 mg by mouth 2 (two) times daily before a meal. Special Instructions: *TAKE ON AN EMPTY STOMACH* FOR GERD *DO NOT CRUSH* (FORMULARY SUB FOR PANTOPRAZOLE 40MG )   Vitamin D, Ergocalciferol, (DRISDOL) 1.25 MG (50000 UNIT) CAPS capsule Take 50,000 Units by mouth every 7 (seven) days.   No facility-administered encounter medications on file as of 03/05/2023.     SIGNIFICANT DIAGNOSTIC EXAMS  PREVIOUS  03-08-22: dexa scan t score -2.346   NO NEW EXAMS    LABS REVIEWED PREVIOUS;     04-06-22: wbc 5.3; hgb 12.4; hct  39.7; mcv 82.9 plt 363; glucose 97; bun 20; creat 0.59; k+ 4.1; na++ 136; ca 8.8; gfr >60; protein 7.0 albumin 3.2; hgb A1c 6.0; vitamin D: 19.98; urine micro-albumin <3.0 04-13-22; wbc 8.0; hgb 12.8; hct 41.7; mcv 85.1 plt 319; iron 91; tibc 266; ferritin 203; vitamin D 19.98  07-06-22: urine micro-albumin 15.6 08-11-22: wbc 5.7; hgb 11.7; hct 37.8; mcv 88.3 plt 316; glucose 86; bun 17; creat 0.62; k+ 4.1; na++ 138; ca 8.6; gfr >  60 10-12-22: wbc 5.8; hgb 12.8; hct 41.0; mcv 87.0; clump iron 46; tibc 256; ferritin 173 12-07-22; vitamin D: 19.98; hgb A1c 5.9   TODAY  03-01-23: glucose 93; bun 13; creat 0.56; k+ 3.6; na++ 138; ca 8.4 gfr >60; protein 6.5 albumin 2.9; hgb A1c 6.1 03-03-23: wbc 5.2; hgb 11.4; hct 36.7; mcv 84.4 plt 336; glucose 155; bun 13; creat 0.69; k+ 3.9; na++ 134; ca 8.7; gfr >60; protein 6.7 albumin 3.2 d-dimer >20 CRP 14.6      Review of Systems  Constitutional:  Negative for malaise/fatigue.  HENT:  Positive for congestion.   Respiratory:  Negative for cough and shortness of breath.   Cardiovascular:  Negative for chest pain, palpitations and leg swelling.  Gastrointestinal:  Negative for abdominal pain, constipation and heartburn.  Musculoskeletal:  Negative for back pain, joint pain and myalgias.  Skin: Negative.   Neurological:  Negative for dizziness.  Psychiatric/Behavioral:  The patient is not nervous/anxious.    Physical Exam Constitutional:      General: She is not in acute distress.    Appearance: She is well-developed. She is morbidly obese. She is not diaphoretic.  Neck:     Thyroid: No thyromegaly.  Cardiovascular:     Rate and Rhythm: Normal rate and regular rhythm.     Pulses: Normal pulses.     Heart sounds: Normal heart sounds.  Pulmonary:     Effort: Pulmonary effort is normal. No respiratory distress.     Breath sounds: Normal breath sounds.  Abdominal:     General: Bowel sounds are normal. There is no distension.     Palpations: Abdomen is  soft.     Tenderness: There is no abdominal tenderness.  Musculoskeletal:     Cervical back: Neck supple.     Right lower leg: No edema.     Left lower leg: No edema.     Comments:  Contractures lower extremities         Lymphadenopathy:     Cervical: No cervical adenopathy.  Skin:    General: Skin is warm and dry.  Neurological:     Mental Status: She is alert. Mental status is at baseline.  Psychiatric:        Mood and Affect: Mood normal.     ASSESSMENT/ PLAN:  TODAY  Covid 19 with comorbid diabetes mellitus Pneumonia associated with covid 19 Hypercoagulable state associated with type covid 19  Will continue molnupivir Will continue prednisone through 03-08-23 Will continue eliquis through 03-18-23 Will continue to monitor her status.    Synthia Innocent NP [facility]  call [phone]

## 2023-03-06 DIAGNOSIS — J1282 Pneumonia due to coronavirus disease 2019: Secondary | ICD-10-CM | POA: Insufficient documentation

## 2023-03-06 DIAGNOSIS — E119 Type 2 diabetes mellitus without complications: Secondary | ICD-10-CM | POA: Insufficient documentation

## 2023-03-06 DIAGNOSIS — U071 COVID-19: Secondary | ICD-10-CM | POA: Insufficient documentation

## 2023-03-19 ENCOUNTER — Encounter: Payer: Self-pay | Admitting: Adult Health

## 2023-03-19 ENCOUNTER — Non-Acute Institutional Stay (SKILLED_NURSING_FACILITY): Payer: Medicare HMO | Admitting: Adult Health

## 2023-03-19 DIAGNOSIS — Z0000 Encounter for general adult medical examination without abnormal findings: Secondary | ICD-10-CM | POA: Diagnosis not present

## 2023-03-19 NOTE — Patient Instructions (Signed)
  Anna Hanna , Thank you for taking time to come for your Medicare Wellness Visit. I appreciate your ongoing commitment to your health goals. Please review the following plan we discussed and let me know if I can assist you in the future.   These are the goals we discussed:  Goals      DIET - INCREASE WATER INTAKE     Follow up with Provider as scheduled     General - Client will not be readmitted within 30 days ([facility])        This is a list of the screening recommended for you and due dates:  Health Maintenance  Topic Date Due   Medicare Annual Wellness Visit  03/16/2023   Flu Shot  02/22/2023   Eye exam for diabetics  06/25/2023*   COVID-19 Vaccine (9 - 2023-24 season) 06/25/2023*   Complete foot exam   04/06/2023   Hemoglobin A1C  09/01/2023   DTaP/Tdap/Td vaccine (2 - Td or Tdap) 05/26/2031   Pneumonia Vaccine  Completed   DEXA scan (bone density measurement)  Completed   HPV Vaccine  Aged Out   Zoster (Shingles) Vaccine  Discontinued  *Topic was postponed. The date shown is not the original due date.

## 2023-03-19 NOTE — Progress Notes (Signed)
Subjective:   Anna Hanna is a 88 y.o. female who presents for Medicare Annual (Subsequent) preventive examination.  Visit Complete: In person  Patient Medicare AWV questionnaire was completed by the patient on 03-19-23; I have confirmed that all information answered by patient is correct and no changes since this date.  Review of Systems    Review of Systems  Constitutional:  Negative for malaise/fatigue.  Respiratory:  Negative for cough and shortness of breath.   Cardiovascular:  Negative for chest pain, palpitations and leg swelling.  Gastrointestinal:  Negative for abdominal pain, constipation and heartburn.  Musculoskeletal:  Negative for back pain, joint pain and myalgias.  Skin: Negative.   Neurological:  Negative for dizziness.  Psychiatric/Behavioral:  The patient is not nervous/anxious.     Cardiac Risk Factors include: advanced age (>2men, >80 women);diabetes mellitus;hypertension;obesity (BMI >30kg/m2);sedentary lifestyle     Objective:    Today's Vitals   03/19/23 1349  BP: 139/76  Pulse: 100  Resp: 20  Temp: (!) 97.1 F (36.2 C)  SpO2: 98%  Weight: 240 lb 6.4 oz (109 kg)  Height: 5\' 5"  (1.651 m)   Body mass index is 40 kg/m.     02/27/2023   11:36 AM 01/09/2023    9:24 AM 12/11/2022   12:25 PM 12/05/2022    8:23 AM 10/25/2022    2:26 PM 08/29/2022   10:44 AM 07/03/2022    9:52 AM  Advanced Directives  Does Patient Have a Medical Advance Directive? No No No No No No No  Does patient want to make changes to medical advance directive?   No - Patient declined No - Patient declined No - Patient declined No - Patient declined No - Patient declined  Would patient like information on creating a medical advance directive?   No - Patient declined No - Patient declined       Current Medications (verified) Outpatient Encounter Medications as of 03/19/2023  Medication Sig   acetaminophen (TYLENOL) 650 MG CR tablet Take 650 mg by mouth 2 (two) times daily.    calcium-vitamin D (OSCAL WITH D) 500-5 MG-MCG tablet Take 1 tablet by mouth daily.   latanoprost (XALATAN) 0.005 % ophthalmic solution Place 1 drop into both eyes at bedtime. wait 5 minutes between multiple eye drops   loratadine (CLARITIN) 10 MG tablet Take 10 mg by mouth daily.   NON FORMULARY Diet: NAS,   omeprazole (PRILOSEC) 20 MG capsule Take 20 mg by mouth 2 (two) times daily before a meal. Special Instructions: *TAKE ON AN EMPTY STOMACH* FOR GERD *DO NOT CRUSH* (FORMULARY SUB FOR PANTOPRAZOLE 40MG )   Vitamin D, Ergocalciferol, (DRISDOL) 1.25 MG (50000 UNIT) CAPS capsule Take 50,000 Units by mouth every 7 (seven) days.   No facility-administered encounter medications on file as of 03/19/2023.    Allergies (verified) Patient has no known allergies.   History: Past Medical History:  Diagnosis Date   Abnormal posture    Per [facility] Electronic Medical Records System    Anemia    Cognitive communication deficit    Per [facility] Electronic [facility] Records [facility]    Contracture of left knee    Per [facility] Electronic Medical Records [facility]    Diabetes mellitus without complication (HCC)    Difficulty walking    Per [facility] Medical Records [facility]    Extended spectrum beta lactamase (ESBL) resistance    Per [facility] Electronic Medical  Records System    Gastroesophageal reflux disease    Per [facility] Records [facility]    Glaucoma    Gram negative sepsis (HCC)    Per [facility] Electronic Medical Records [facility]    Hypercholesteremia    Hypertension    Hypokalemia    Infection, bacterial    Per [facility] Electronic Medical Records [facility]    Iron deficiency anemia due to chronic blood loss 11/23/2021   Muscle weakness (generalized)    Per [facility] Electronic Medical Records  System    Need for assistance with personal care    Per [facility] Records [facility]    OAB (overactive bladder)    Per [facility] Records [facility]    Ocular hypertension, bilateral    Per [facility] Records [facility]    Osteoarthritis of right knee    Per [facility] Records [facility]    Paraplegia [facility])    Pyelonephritis, acute    Per [facility] Electronic Medical Records System    Status post total right knee replacement    Unspecified dementia without behavioral disturbance    Per [facility] Electronic Medical Records System    Urgency of urination    Per [facility] Records [facility]    Wheelchair dependence    Per [facility] Electronic [facility] Records [facility]    Past Surgical History:  Procedure Laterality Date   ABDOMINAL HYSTERECTOMY     CATARACT EXTRACTION     COLONOSCOPY N/A 12/10/2013   [place] Bali, MD ,Gi revealed redundant colon   TOTAL KNEE ARTHROPLASTY Right 03/30/2016   Procedure: TOTAL KNEE ARTHROPLASTY;  Surgeon: Vickki Hearing, MD;  Location: [facility];  Service: Orthopedics;  Laterality: Right;   Family History  Problem Relation Age of Onset   Cancer Mother    Arthritis Mother    Hypotension Father    Cancer Sister    Hypotension Sister    Colon cancer Neg Hx    Social History   Socioeconomic History   Marital status: Divorced    Spouse name: Not on file   Number of children: Not on file   Years of education: Not on file   Highest education level: Not on file  Occupational History   Occupation: retired   Tobacco Use   Smoking status: Former    Current packs/day: 0.25    Average packs/day: 0.3 packs/day for 3.0 years (0.8 ttl pk-yrs)    Types: Cigarettes   Smokeless tobacco: Never  Vaping Use   Vaping status:  Never Used  Substance and Sexual Activity   Alcohol use: No   Drug use: No   Sexual activity: Not Currently    Birth control/protection: Post-menopausal  Other Topics Concern   Not on file  Social History Narrative   Long term resident of [facility]    Social Determinants of Health   Financial Resource Strain: Not on file  Food Insecurity: Not on file  Transportation Needs: Not on file  Physical Activity: Inactive (02/17/2020)   Exercise Vital Sign    Days of Exercise per Week: 0 days    Minutes of Exercise per Session: 0 min  Stress: Not on file  Social Connections: Not on file    Tobacco Counseling Counseling given: Not Answered   Clinical Intake:  Pre-visit preparation completed: Yes  Pain : No/denies pain     BMI - recorded: 40 Nutritional Status: BMI > 30  Obese Nutritional Risks: Unintentional weight gain Diabetes: Yes CBG done?: Yes CBG resulted in Enter/ Edit results?: Yes Did pt. bring in CBG monitor from home?: No  How often do you need to have someone help you when you read instructions, pamphlets, or other written materials from your doctor or pharmacy?: 5 - Always  Interpreter Needed?: No      Activities of Daily Living    03/19/2023    1:52 PM  In your present state of health, do you have any difficulty performing the following activities:  Hearing? 0  Vision? 0  Difficulty concentrating or making decisions? 1  Walking or climbing stairs? 1  Dressing or bathing? 1  Doing errands, shopping? 1  Preparing Food and eating ? Y  Using the Toilet? Y  In the past six months, have you accidently leaked urine? Y  Do you have problems with loss of bowel control? Y  Managing your Medications? Y  Managing your Finances? Y  Housekeeping or managing your Housekeeping? Y    Patient Care Team: Sharee Holster, NP as PCP - [facility] (Geriatric Medicine) [facility] Lanelle Bal, DO as Consulting Physician  (Gastroenterology) Doreatha Massed, MD as Medical Oncologist (Hematology)  Indicate any recent Medical Services you may have received from other than [facility] providers in the past year (date may be approximate).     Assessment:   This is a routine wellness examination for [facility].  Hearing/Vision screen No results found.  Dietary issues and exercise activities discussed:     Goals Addressed             This Visit's Progress    DIET - INCREASE WATER INTAKE   On track    Follow up with Provider as scheduled   Not on track    General - Client will not be readmitted within 30 days ([facility])   On track      Depression Screen    03/19/2023    2:00 PM 02/27/2023   11:36 AM 08/30/2022   12:34 PM 03/15/2022    1:56 PM 08/26/2021   11:48 AM 08/04/2021    1:45 PM 07/22/2021    1:30 PM  PHQ 2/9 Scores  PHQ - 2 Score 0 0 0 0 0 0 0  PHQ- 9 Score   0 0 0      Fall Risk    03/19/2023    1:54 PM 02/27/2023   11:36 AM 01/10/2023   12:11 PM 08/29/2022   10:45 AM 07/03/2022    9:53 AM  Fall Risk   Falls in the past year? 1 0 0 0 0  Number falls in past yr: 0 0 0 0 0  Injury with Fall? 0 0 0 0 0  Risk for fall due to : History of fall(s);Impaired balance/gait;Impaired mobility No Fall Risks Impaired balance/gait;Impaired mobility History of fall(s);Impaired balance/gait;Impaired mobility History of fall(s);Impaired balance/gait;Impaired mobility  Follow up Falls evaluation completed Falls evaluation completed Falls evaluation completed Falls evaluation completed Falls evaluation completed    MEDICARE RISK AT HOME: Medicare Risk at Home Any stairs in or around the home?: Yes If so, are there any without handrails?: No Home free of loose throw rugs in walkways, pet beds, electrical cords, etc?: Yes Adequate lighting in your home to reduce risk of falls?: Yes Life alert?: No Use of  a cane, walker or w/c?: Yes Grab bars in the bathroom?: Yes Shower chair or bench in shower?: Yes Elevated  toilet seat or a handicapped toilet?: Yes  TIMED UP AND GO:  Was the test performed?  No    Cognitive Function:    03/19/2023    2:00 PM 03/15/2022    1:57 PM 03/14/2021    3:26 PM  MMSE - Mini Mental State Exam  Not completed: Unable to complete    Orientation to time  3 4  Orientation to Place  3 3  Registration  3 3  Attention/ Calculation  2 2  Recall  1 1  Language- name 2 objects  2 2  Language- repeat  1 1  Language- follow 3 step command  3 3  Language- read & follow direction  1 1  Write a sentence  0 0  Copy design  0 0  Total score  19 20        03/15/2022    1:58 PM 03/14/2021    3:27 PM 02/17/2020   10:48 AM  6CIT Screen  What Year? 4 points 4 points 4 points  What month? 3 points 3 points   What time? 3 points 3 points   Count back from 20 4 points 4 points   Months in reverse 4 points 4 points   Repeat phrase 4 points 6 points   Total Score 22 points 24 points     Immunizations Immunization History  Administered Date(s) Administered   Fluad Quad(high Dose 65+) 04/27/2021   Influenza-Unspecified 04/30/2020, 04/25/2022   Moderna Covid-19 Vaccine Bivalent Booster 29yrs & up 05/17/2021, 12/13/2021   Moderna SARS-COV2 Booster Vaccination 11/03/2020   Moderna Sars-Covid-2 Vaccination 08/03/2019, 08/31/2019, 05/27/2020, 12/13/2021, 05/17/2022   PNEUMOCOCCAL CONJUGATE-20 04/29/2021   PPD Test 10/03/2016   Pneumococcal Conjugate-13 04/20/2016   Rsv, Bivalent, Protein Subunit Rsvpref,pf Verdis Frederickson) 06/26/2022   Tdap 05/25/2021    TDAP status: Up to date  Flu Vaccine status: Up to date  Pneumococcal vaccine status: Up to date  Covid-19 vaccine status: Completed vaccines  Qualifies for Shingles Vaccine? No   Zostavax completed Yes   Shingrix Completed?: No.    Education has been provided regarding the importance of this vaccine. Patient has been advised to call insurance company to determine out of pocket expense if they have not yet received this  vaccine. Advised may also receive vaccine at local pharmacy or Health Dept. Verbalized acceptance and understanding.  Screening Tests Health Maintenance  Topic Date Due   Medicare Annual Wellness (AWV)  03/16/2023   INFLUENZA VACCINE  02/22/2023   OPHTHALMOLOGY EXAM  06/25/2023 (Originally 01/05/2023)   COVID-19 Vaccine (9 - 2023-24 season) 06/25/2023 (Originally 07/12/2022)   FOOT EXAM  04/06/2023   HEMOGLOBIN A1C  09/01/2023   DTaP/Tdap/Td (2 - Td or Tdap) 05/26/2031   Pneumonia Vaccine 20+ Years old  Completed   DEXA SCAN  Completed   HPV VACCINES  Aged Out   Zoster Vaccines- Shingrix  Discontinued    Health Maintenance  Health Maintenance Due  Topic Date Due   Medicare Annual Wellness (AWV)  03/16/2023   INFLUENZA VACCINE  02/22/2023    Colorectal cancer screening: No longer required.   Mammogram status: No longer required due to age.  Bone Density status: Completed 03-08-22. Results reflect: Bone density results: OSTEOPOROSIS. Repeat every 2 years.  Lung Cancer Screening: (Low Dose CT Chest recommended if Age 47-80 years, 20 pack-year currently smoking OR have quit w/in 15years.) does  not qualify.   Lung Cancer Screening Referral: n/a  Additional Screening:  Hepatitis C Screening: does not qualify; Completed   Vision Screening: Recommended annual ophthalmology exams for early detection of glaucoma and other disorders of the eye. Is the patient up to date with their annual eye exam?  No  Who is the provider or what is the name of the office in which the patient attends annual eye exams?  If pt is not established with a provider, would they like to be referred to a provider to establish care? No .   Dental Screening: Recommended annual dental exams for proper oral hygiene  Diabetic Foot Exam: Diabetic Foot Exam: Completed 09/23  Community Resource Referral / Chronic Care Management: CRR required this visit?  No   CCM required this visit?  No     Plan:     I  have personally reviewed and noted the following in the patient's chart:   Medical and social history Use of alcohol, tobacco or illicit drugs  Current medications and supplements including opioid prescriptions. Patient is not currently taking opioid prescriptions. Functional ability and status Nutritional status Physical activity Advanced directives List of other physicians Hospitalizations, surgeries, and ER visits in previous 12 months Vitals Screenings to include cognitive, depression, and falls Referrals and appointments  In addition, I have reviewed and discussed with patient certain preventive protocols, quality metrics, and best practice recommendations. A written personalized care plan for preventive services as well as general preventive health recommendations were provided to patient.     Sharee Holster, NP   03/19/2023   After Visit Summary: (MyChart) Due to this being a telephonic visit, the after visit summary with patients personalized plan was offered to patient via MyChart   Nurse Notes: this exam has been completed at this facility by myself

## 2023-04-10 ENCOUNTER — Non-Acute Institutional Stay (SKILLED_NURSING_FACILITY): Payer: Medicare HMO | Admitting: Internal Medicine

## 2023-04-10 ENCOUNTER — Encounter: Payer: Self-pay | Admitting: Internal Medicine

## 2023-04-10 DIAGNOSIS — E1351 Other specified diabetes mellitus with diabetic peripheral angiopathy without gangrene: Secondary | ICD-10-CM | POA: Diagnosis not present

## 2023-04-10 DIAGNOSIS — F01B0 Vascular dementia, moderate, without behavioral disturbance, psychotic disturbance, mood disturbance, and anxiety: Secondary | ICD-10-CM | POA: Diagnosis not present

## 2023-04-10 DIAGNOSIS — Z993 Dependence on wheelchair: Secondary | ICD-10-CM

## 2023-04-10 DIAGNOSIS — I10 Essential (primary) hypertension: Secondary | ICD-10-CM | POA: Diagnosis not present

## 2023-04-10 NOTE — Progress Notes (Signed)
NURSING HOME LOCATION:  [facility] ROOM NUMBER:  [id number]  CODE STATUS:  Full Code  PCP:  Synthia Innocent NP  This is a nursing facility follow up visit of chronic medical diagnoses & to document compliance with Regulation 483.30 (c) in The Long Term Care Survey Manual Phase 2 which mandates caregiver visit ( visits can alternate among physician, PA or NP as per statutes) within 10 days of 30 days / 60 days/ 90 days post admission to SNF date    Interim medical record and care since last SNF visit was updated with review of diagnostic studies and change in clinical status since last visit were documented.  HPI: She is a permanent resident of this facility with medical diagnoses of glaucoma, dyslipidemia, hypertension, GERD, OAB, and diabetes with neurovascular complications. Labs were performed 03/03/2023 because of positive COVID screen. These revealed mild hyponatremia with a sodium of 134.  Hyperglycemia was present with a glucose of 155; prior values have been less than 95. A1c was pre diabetic with value of 6.1%. There is been improvement in her hypocalcemia rising from 8.4 up to 8.7.  Protein/caloric malnutrition is improved as well as albumin rose from 2.9 up to 3.2.  Total protein also increased from 6.5 up to 6.7. CRP & D dimer were  significantly elevated @ 14.6 & > 20.00. Minimal anemia was present  with H/H of 11.4/36.7. SNF COVID protocol was initiated.  Review of systems: Dementia invalidated responses.  She denies having had COVID.  Initially she denied any symptoms; but subsequently she complained of arthritis in her hands.  A short time later she complained of wheezing.  She confabulated about needing to go home because of water damage in the abode.  Constitutional: No fever, significant weight change, fatigue  Eyes: No redness, discharge, pain, vision change ENT/mouth: No nasal congestion,  purulent discharge, earache, change in hearing, sore throat   Cardiovascular: No chest pain, palpitations, paroxysmal nocturnal dyspnea, claudication, edema  Gastrointestinal: No heartburn, dysphagia, abdominal pain, nausea /vomiting, rectal bleeding, melena, change in bowels Genitourinary: No dysuria, hematuria, pyuria, incontinence, nocturia Dermatologic: No rash, pruritus, change in appearance of skin Neurologic: No dizziness, headache, syncope, seizures, numbness, tingling Psychiatric: No significant anxiety, depression, insomnia, anorexia Endocrine: No change in hair/skin/nails, excessive thirst, excessive hunger, excessive urination  Hematologic/lymphatic: No significant bruising, lymphadenopathy, abnormal bleeding Allergy/immunology: No itchy/watery eyes, significant sneezing, urticaria, angioedema  Physical exam:  Pertinent or positive findings: She is in the wheelchair. Morbid obesity is present. She is edentulous.  When asked about dentures she stated "they are in the shop."  Slight tachycardia is present.  Breath sounds are decreased.  She could not understand to take deep breaths to allow optimal assessment of her complaint of wheezing.  Abdomen is protuberant.  Pedal pulses are absent.  She has lipomatous changes below the right knee.  The right lower extremity is flexed and she states she cannot extend it.  Fusiform changes in the knees are noted.  She has 1+ edema at the sock line.  Slight clubbing of the nailbeds is suggested.  She has flexion contracture of the fifth digit bilaterally.  General appearance: Adequately nourished; no acute distress, increased work of breathing is present.   Lymphatic: No lymphadenopathy about the head, neck, axilla. Eyes: No conjunctival inflammation or lid edema is present. There is no scleral icterus. Ears:  External ear exam shows no significant lesions or deformities.   Nose:  External nasal examination shows no deformity or  inflammation. Nasal mucosa are pink and moist without lesions, exudates Neck:  No  thyromegaly, masses, tenderness noted.    Heart:  No gallop, murmur, click, rub .  Lungs:  without wheezes, rhonchi, rales, rubs. Abdomen: Bowel sounds are normal. Abdomen is soft and nontender with no organomegaly, hernias, masses. GU: Deferred  Neurologic exam :Balance, Rhomberg, finger to nose testing could not be completed due to clinical state Skin: Warm & dry w/o tenting. No significant lesions or rash.  See summary under each active problem in the Problem List with associated updated therapeutic plan

## 2023-04-12 DIAGNOSIS — E1351 Other specified diabetes mellitus with diabetic peripheral angiopathy without gangrene: Secondary | ICD-10-CM | POA: Insufficient documentation

## 2023-04-12 NOTE — Assessment & Plan Note (Addendum)
Isolated glucoses recorded as high as 155; prior values in the less than 95 range.  A1c is prediabetic with a value of 6.1%. Vascular complications include vascular dementia and absent pedal pulses. Monitoring A1c every 6 months should be adequate.

## 2023-04-12 NOTE — Assessment & Plan Note (Signed)
Clinically she has significant osteoarthritis of the knees with fusiform enlargement.  There is a flexion contracture of the right lower extremity with inability to extend the leg below the knee.

## 2023-04-12 NOTE — Assessment & Plan Note (Addendum)
Serial blood pressures reviewed.  Rarely is systolic BP minimally elevated with a high documented @ 142.  She is not on antihypertensive medications and none will be initiated unless there is persistent elevation.

## 2023-04-12 NOTE — Patient Instructions (Signed)
See assessment and plan under each diagnosis in the problem list and acutely for this visit 

## 2023-04-12 NOTE — Assessment & Plan Note (Addendum)
She denies having had COVID.  She could not follow commands.  Staff does not report any behavioral issues.  Continue to monitor for progression.

## 2023-04-17 ENCOUNTER — Other Ambulatory Visit: Payer: Self-pay

## 2023-04-17 DIAGNOSIS — D500 Iron deficiency anemia secondary to blood loss (chronic): Secondary | ICD-10-CM

## 2023-04-18 ENCOUNTER — Inpatient Hospital Stay: Payer: Medicare HMO | Attending: Hematology

## 2023-04-18 ENCOUNTER — Inpatient Hospital Stay: Payer: Medicare HMO

## 2023-04-18 DIAGNOSIS — D509 Iron deficiency anemia, unspecified: Secondary | ICD-10-CM | POA: Insufficient documentation

## 2023-04-18 DIAGNOSIS — D500 Iron deficiency anemia secondary to blood loss (chronic): Secondary | ICD-10-CM

## 2023-04-18 LAB — CBC WITH DIFFERENTIAL/PLATELET
Abs Immature Granulocytes: 0.01 K/uL (ref 0.00–0.07)
Basophils Absolute: 0 K/uL (ref 0.0–0.1)
Basophils Relative: 1 %
Eosinophils Absolute: 0.2 K/uL (ref 0.0–0.5)
Eosinophils Relative: 3 %
HCT: 39.2 % (ref 36.0–46.0)
Hemoglobin: 12 g/dL (ref 12.0–15.0)
Immature Granulocytes: 0 %
Lymphocytes Relative: 20 %
Lymphs Abs: 1.2 K/uL (ref 0.7–4.0)
MCH: 25.9 pg — ABNORMAL LOW (ref 26.0–34.0)
MCHC: 30.6 g/dL (ref 30.0–36.0)
MCV: 84.5 fL (ref 80.0–100.0)
Monocytes Absolute: 0.5 K/uL (ref 0.1–1.0)
Monocytes Relative: 8 %
Neutro Abs: 3.9 K/uL (ref 1.7–7.7)
Neutrophils Relative %: 68 %
Platelets: 329 K/uL (ref 150–400)
RBC: 4.64 MIL/uL (ref 3.87–5.11)
RDW: 14.5 % (ref 11.5–15.5)
WBC: 5.8 K/uL (ref 4.0–10.5)
nRBC: 0 % (ref 0.0–0.2)

## 2023-04-18 LAB — FERRITIN: Ferritin: 174 ng/mL (ref 11–307)

## 2023-04-18 LAB — IRON AND TIBC
Iron: 50 ug/dL (ref 28–170)
Saturation Ratios: 19 % (ref 10.4–31.8)
TIBC: 264 ug/dL (ref 250–450)
UIBC: 214 ug/dL

## 2023-04-24 NOTE — Progress Notes (Unsigned)
[facility] [address], [place] [address]   CLINIC:  Medical Oncology/Hematology  PCP:  Sharee Holster, NP [address] [place] [address] [phone]   REASON FOR VISIT:  Follow-up for elevated D-dimer and iron deficiency anemia  PRIOR THERAPY: Eliquis 5 mg twice daily  CURRENT THERAPY: Oral iron supplementation + intermittent IV iron  INTERVAL HISTORY:   Anna Hanna 88 y.o. female returns for routine follow-up of her elevated D-dimer and iron deficiency anemia.  She was last seen by Rojelio Brenner PA-C on 10/19/2022.  At today's visit, she reports feeling fairly well.  She has not had any recent infections, surgeries, hospitalizations since her last visit.  She does not have any current signs of DVT or PE.   She denies any unilateral leg swelling, pain, and erythema.   She has not noticed any new shortness of breath, dyspnea on exertion, chest pain, hemoptysis, and palpitations.  She reports that she has intermittent soft, black-colored bowel movements, which she thinks may be from taking her iron pill. She has some fatigue.  She denies any pica, restless legs, headaches, chest pain, dyspnea on exertion, lightheadedness, or syncope.  She has 25% energy and 100% appetite. She endorses that she is maintaining a stable weight.  ASSESSMENT & PLAN:  1.  Iron deficiency anemia - Hemoccult positive x2 in December 2022 - Patient saw GI (Dr. Marletta Lor) on 08/11/2021, but declined further work-up with EGD/colonoscopy. - CT abdomen/pelvis (01/04/2022): No evidence of GI tract malignancy - Labs from 08/26/2021 showed Hgb 9.9/MCV 77.4.  Ferritin 10 with iron saturation 5%.  Mild thrombocytosis with platelets 410. - Additional labs (08/26/2021): Normal SPEP.  Normal B12, methylmalonic acid, folate, copper. - She reports black stool, but thinks that this may be from her iron tablet. - She is taking daily iron polysaccharide tablet. - Most recent IV iron Venofer  300 mg x 3 from 01/20/2022 through 02/17/2022 - Most recent labs (04/18/2023): Hgb 12.0, iron saturation 19%, ferritin 174 - Discussed with patient that she likely has chronic GI blood loss from unknown source and that while her CT scan did not show any evidence of malignancy, this does not mean that it is completely excluded.  She declines further work-up at this time.  She would like to treat supportively with iron repletion as needed. - Blood and iron levels have improved after discontinuation of Eliquis - PLAN: No indication for IV iron at this time - Repeat labs and RTC in 1 year, or sooner if needed   2.  Thrombocytosis - Intermittent mild thrombocytosis noted since at least 2018 - Resolved after iron supplementation  3.  Elevated D-dimer - From extensive review of the chart, it appears that she had COVID infection with elevated D-dimer in June 2022. - Eliquis was started at low-dose of 2.5 mg twice daily around 01/19/2021.  At some point she was switched to Lovenox and then switched back to Eliquis at 5 mg twice daily which she remains on at this time. - She had a CT chest angiogram on 02/18/2021 on 05/05/2021 both of which were negative for pulmonary embolism.  Lower extremity Doppler on 02/24/2021 was negative for DVT. - Denies any recent infections or hospitalizations.  Denies any recent surgeries. - Denies any B symptoms. - Declined EGD/colonoscopy. - 2D echocardiogram (09/20/2021): LVEF 55 to 60%, grade 1 diastolic dysfunction.  No major valvulopathy is noted. - CT abdomen pelvis was obtained on 01/04/2022 to rule out occult malignancy  in the setting of elevated D-dimer, Hemoccult positive stools, and iron deficiency anemia - results revealed left breast calcification suspected to be benign (stable compared to CT abdomen/pelvis from February 2021), no findings suspicious for malignancy. - Hematology work-up (08/26/2021) ruled out chronic DIC Fibrinogen minimally elevated at 477  Elevated  factor VIII at 233% Normal factor V. Elevated INR 1.4/PT 17.1, APTT normal at 32. - Most recent D-dimer (11/22/2021): Markedly elevated at > 20 - Discussed with Dr. Ellin Saba, who recommends against further D-dimer testing at this time, unless patient is exhibiting symptoms concerning for possible blood clots. - Discussed with patient that there is wide differential for elevated D-dimer, including clotting, inflammation, infection, and malignancy. - PLAN: No indication for anticoagulation or ongoing testing of D-dimer.  We will discontinue Eliquis and recommend that D-dimer only be checked if there is clinical suspicion for DVT or PE.   4.  Left breast lesion - Patient has history of oil cyst in the left breast, excisional biopsy performed on 09/17/2002, but I am unable to review pathology reports.  Presumably benign, since patient denies any history of breast cancer.  - Last mammogram on record was 07/01/2015, which is BI-RADS Category 1 negative, no mammographic evidence of malignancy.  No mention of left breast calcification made and report, but it is clearly visible on my personal review of images, nonconcerning in appearance (round, uniform macrocalcification).   - Lesion noted on CT abdomen/pelvis with contrast on 09/03/2019 as a "peripherally calcified 2.2 cm lesion in the left breast/low left axilla partially included" - CT abdomen/pelvis (01/04/2022): "Peripherally calcified nodular density in left breast is rounded measuring 18 mm and appears unchanged" - Suspected to be benign calcification of left breast, given appearance and stability dating back to mammogram in 2016 and prior.   - PLAN: No further work-up or follow-up needed   5.  Social/family history: - She reports that she has been at [facility] for the last couple of years after she broke her right leg and cannot walk.  She is wheelchair dependent.  She worked in a [employer] prior to retirement.  Quit smoking 2 years ago.   Smoked half pack per day for 15 years. - No family history of malignancies.    PLAN SUMMARY: >> No additional IV iron needed at this time. >> Return to clinic in 1 year (CBC/D, ferritin, iron/TIBC) for labs and assessment or sooner if needed.      REVIEW OF SYSTEMS:   Review of Systems  Constitutional:  Positive for fatigue.  Respiratory:  Positive for shortness of breath. Negative for cough.   Cardiovascular:  Positive for leg swelling (faint, bilateral).  Gastrointestinal:  Negative for diarrhea, nausea and vomiting.  Musculoskeletal:  Positive for arthralgias and gait problem (Uses wheelchair).  Neurological:  Positive for gait problem (Uses wheelchair). Negative for dizziness and headaches.  Psychiatric/Behavioral:  Positive for depression. The patient is not nervous/anxious.      PHYSICAL EXAM:  ECOG PERFORMANCE STATUS: 3 - Symptomatic, >50% confined to bed  Vitals:   04/25/23 1257  BP: 114/63  Pulse: 100  Resp: 18  Temp: 98 F (36.7 C)    Filed Weights   04/25/23 1257  Weight: 265 lb (120.2 kg)   Physical Exam Vitals reviewed.  Constitutional:      Appearance: Normal appearance. She is obese.  Cardiovascular:     Rate and Rhythm: Normal rate and regular rhythm.  Pulmonary:     Effort: Pulmonary effort is normal.  Breath sounds: Normal breath sounds.  Abdominal:     General: Bowel sounds are normal.  Lymphadenopathy:     Cervical: No cervical adenopathy.  Skin:    General: Skin is warm and dry.  Neurological:     Mental Status: She is alert and oriented to person, place, and time.     PAST MEDICAL/SURGICAL HISTORY:  Past Medical History:  Diagnosis Date   Abnormal posture    Per [facility] Electronic [facility] Records [facility]    Anemia    Cognitive communication deficit    Per [facility] Records [facility]    Contracture of left knee    Per [facility] Electronic [facility]  Records System    Diabetes mellitus without complication (HCC)    Difficulty walking    Per [facility], [facility] Records [facility]    Extended spectrum beta lactamase (ESBL) resistance    Per [facility], [facility] Records [facility]    Gastroesophageal reflux disease    Per [facility], [facility] Records [facility]    Glaucoma    Gram negative sepsis (HCC)    Per [facility], [facility] Records [facility]    Hypercholesteremia    Hypertension    Hypokalemia    Infection, bacterial    Per [facility], [facility] Records [facility]    Iron deficiency anemia due to chronic blood loss 11/23/2021   Muscle weakness (generalized)    Per [facility], [facility]    Need for assistance with personal care    Per [facility] Records [facility]    OAB (overactive bladder)    Per [facility], [facility]    Ocular hypertension, bilateral    Per [facility] Records [facility]    Osteoarthritis of right knee    Per [facility], [facility] Records [facility]    Paraplegia [facility])    Pyelonephritis, acute    Per [facility], [facility] Records [facility]    Status post total right knee replacement    Unspecified dementia without behavioral disturbance    Per [facility], [facility] Records [facility]    Urgency of urination    Per [facility], [facility] Records [facility]    Wheelchair dependence    Per [facility], [facility] Electronic [facility] Records [facility]    Past Surgical History:  Procedure Laterality Date   ABDOMINAL HYSTERECTOMY     CATARACT EXTRACTION     COLONOSCOPY N/A 12/10/2013   West Bali, MD ,Gi revealed redundant colon   TOTAL KNEE  ARTHROPLASTY Right 03/30/2016   Procedure: TOTAL KNEE ARTHROPLASTY;  Surgeon: Vickki Hearing, MD;  Location: [facility];  Service: Orthopedics;  Laterality: Right;    SOCIAL HISTORY:  Social History   Socioeconomic History   Marital status: Divorced    Spouse name: Not on file   Number of children: Not on file   Years of education: Not on file   Highest education level: Not on file  Occupational History   Occupation: retired   Tobacco Use   Smoking status: Former    Current packs/day: 0.25    Average packs/day: 0.3 packs/day for 3.0 years (0.8 ttl pk-yrs)    Types: Cigarettes   Smokeless tobacco: Never  Vaping Use  Vaping status: Never Used  Substance and Sexual Activity   Alcohol use: No   Drug use: No   Sexual activity: Not Currently    Birth control/protection: Post-menopausal  Other Topics Concern   Not on file  Social History Narrative   Long term resident of [facility]    Social Determinants of Health   Financial Resource Strain: Not on file  Food Insecurity: Not on file  Transportation Needs: Not on file  Physical Activity: Inactive (02/17/2020)   Exercise Vital Sign    Days of Exercise per Week: 0 days    Minutes of Exercise per Session: 0 min  Stress: Not on file  Social Connections: Not on file  Intimate Partner Violence: Not At Risk (02/17/2020)   Humiliation, Afraid, Rape, and Kick questionnaire    Fear of Current or Ex-Partner: No    Emotionally Abused: No    Physically Abused: No    Sexually Abused: No    FAMILY HISTORY:  Family History  Problem Relation Age of Onset   Cancer Mother    Arthritis Mother    Hypotension Father    Cancer Sister    Hypotension Sister    Colon cancer Neg Hx     CURRENT MEDICATIONS:  Outpatient Encounter Medications as of 04/25/2023  Medication Sig   acetaminophen (TYLENOL) 650 MG CR tablet Take 650 mg by mouth 2 (two) times daily.   calcium-vitamin D (OSCAL WITH D) 500-5 MG-MCG tablet Take 1 tablet by mouth daily.    ELIQUIS 5 MG TABS tablet Take 5 mg by mouth 2 (two) times daily.   latanoprost (XALATAN) 0.005 % ophthalmic solution Place 1 drop into both eyes at bedtime. wait 5 minutes between multiple eye drops   loratadine (CLARITIN) 10 MG tablet Take 10 mg by mouth daily.   NON FORMULARY Diet: NAS,   omeprazole (PRILOSEC) 20 MG capsule Take 20 mg by mouth 2 (two) times daily before a meal. Special Instructions: *TAKE ON AN EMPTY STOMACH* FOR GERD *DO NOT CRUSH* (FORMULARY SUB FOR PANTOPRAZOLE 40MG )   Vitamin D, Ergocalciferol, (DRISDOL) 1.25 MG (50000 UNIT) CAPS capsule Take 50,000 Units by mouth every 7 (seven) days.   No facility-administered encounter medications on file as of 04/25/2023.    ALLERGIES:  No Known Allergies  LABORATORY DATA:  I have reviewed the labs as listed.  CBC    Component Value Date/Time   WBC 5.8 04/18/2023 0945   RBC 4.64 04/18/2023 0945   HGB 12.0 04/18/2023 0945   HCT 39.2 04/18/2023 0945   HCT 26.4 (L) 04/03/2016 0550   PLT 329 04/18/2023 0945   MCV 84.5 04/18/2023 0945   MCH 25.9 (L) 04/18/2023 0945   MCHC 30.6 04/18/2023 0945   RDW 14.5 04/18/2023 0945   LYMPHSABS 1.2 04/18/2023 0945   MONOABS 0.5 04/18/2023 0945   EOSABS 0.2 04/18/2023 0945   BASOSABS 0.0 04/18/2023 0945      Latest Ref Rng & Units 03/03/2023    8:50 AM 03/01/2023    4:30 AM 08/11/2022    8:00 AM  CMP  Glucose 70 - 99 mg/dL 161  93  86   BUN 8 - 23 mg/dL 13  13  17    Creatinine 0.44 - 1.00 mg/dL 0.96  0.45  4.09   Sodium 135 - 145 mmol/L 134  138  138   Potassium 3.5 - 5.1 mmol/L 3.9  3.6  4.1   Chloride 98 - 111 mmol/L 99  104  101   CO2  22 - 32 mmol/L 26  26  29    Calcium 8.9 - 10.3 mg/dL 8.7  8.4  8.6   Total Protein 6.5 - 8.1 g/dL 6.7  6.5    Total Bilirubin 0.3 - 1.2 mg/dL 0.5  0.3    Alkaline Phos 38 - 126 U/L 84  75    AST 15 - 41 U/L 17  10    ALT 0 - 44 U/L 12  9      DIAGNOSTIC IMAGING:  I have independently reviewed the relevant imaging and discussed with the  patient.   WRAP UP:  All questions were answered. The patient knows to call the clinic with any problems, questions or concerns.  Time spent on visit: I spent 15 minutes counseling the patient face to face. The total time spent in the appointment was 22 minutes and more than 50% was on counseling.  Carnella Guadalajara, PA-C  04/25/23 1:35 PM

## 2023-04-25 ENCOUNTER — Encounter: Payer: Self-pay | Admitting: Adult Health

## 2023-04-25 ENCOUNTER — Encounter: Payer: Self-pay | Admitting: Physician Assistant

## 2023-04-25 ENCOUNTER — Inpatient Hospital Stay: Payer: Medicare HMO | Attending: Physician Assistant | Admitting: Physician Assistant

## 2023-04-25 ENCOUNTER — Non-Acute Institutional Stay (SKILLED_NURSING_FACILITY): Payer: Medicare HMO | Admitting: Adult Health

## 2023-04-25 VITALS — BP 114/63 | HR 100 | Temp 98.0°F | Resp 18 | Wt 265.0 lb

## 2023-04-25 DIAGNOSIS — H40053 Ocular hypertension, bilateral: Secondary | ICD-10-CM

## 2023-04-25 DIAGNOSIS — D509 Iron deficiency anemia, unspecified: Secondary | ICD-10-CM | POA: Diagnosis present

## 2023-04-25 DIAGNOSIS — D75839 Thrombocytosis, unspecified: Secondary | ICD-10-CM | POA: Diagnosis not present

## 2023-04-25 DIAGNOSIS — R791 Abnormal coagulation profile: Secondary | ICD-10-CM | POA: Insufficient documentation

## 2023-04-25 DIAGNOSIS — Z7901 Long term (current) use of anticoagulants: Secondary | ICD-10-CM | POA: Insufficient documentation

## 2023-04-25 DIAGNOSIS — E876 Hypokalemia: Secondary | ICD-10-CM | POA: Diagnosis not present

## 2023-04-25 DIAGNOSIS — D500 Iron deficiency anemia secondary to blood loss (chronic): Secondary | ICD-10-CM

## 2023-04-25 DIAGNOSIS — R7989 Other specified abnormal findings of blood chemistry: Secondary | ICD-10-CM | POA: Diagnosis not present

## 2023-04-25 DIAGNOSIS — Z87891 Personal history of nicotine dependence: Secondary | ICD-10-CM | POA: Diagnosis not present

## 2023-04-25 DIAGNOSIS — K921 Melena: Secondary | ICD-10-CM | POA: Diagnosis not present

## 2023-04-25 DIAGNOSIS — I700 Atherosclerosis of aorta: Secondary | ICD-10-CM | POA: Diagnosis not present

## 2023-04-25 NOTE — Progress Notes (Unsigned)
Location:  [facility] Room Number: 105 Place of Service:  SNF (31)   CODE STATUS: full code   No Known Allergies  Chief Complaint  Patient presents with   Medical Management of Chronic Issues          Aortic atherosclerosis  Hypokalemia  Increased ocular pressure bilateral     HPI:  She is a 88 year old long term resident of this facility being seen for the management of her chronic illnesses: Aortic atherosclerosis  Hypokalemia  Increased ocular pressure bilateral. There are no reports of uncontrolled pain. There are no reports of anxiety or depressive thoughts.   Past Medical History:  Diagnosis Date   Abnormal posture    Per [facility] Electronic [facility] Records [facility]    Anemia    Cognitive communication deficit    Per [facility] Records [facility]    Contracture of left knee    Per [facility] Records [facility]    Diabetes mellitus without complication (HCC)    Difficulty walking    Per [facility] Records [facility]    Extended spectrum beta lactamase (ESBL) resistance    Per [facility], [facility] Records [facility]    Gastroesophageal reflux disease    Per [facility] Records [facility]    Glaucoma    Gram negative sepsis (HCC)    Per [facility], [facility] Records [facility]    Hypercholesteremia    Hypertension    Hypokalemia    Infection, bacterial    Per [facility] Records [facility]    Iron deficiency anemia due to chronic blood loss 11/23/2021   Muscle weakness (generalized)    Per [facility], [facility] Records [facility]    Need for assistance with personal care    Per [facility], [facility] Records [facility]    OAB (overactive bladder)    Per [facility]    Ocular hypertension, bilateral    Per [facility] Records [facility]    Osteoarthritis of right knee    Per [facility], [facility] Records [facility]    Paraplegia [facility])    Pyelonephritis, acute    Per [facility], [facility] Records [facility]    Status post total right knee replacement    Unspecified dementia without behavioral disturbance    Per [facility], [facility] Records [facility]    Urgency of urination    Per [facility]    Wheelchair dependence    Per Ria Bush, [facility] Records [facility]     Past Surgical History:  Procedure Laterality Date   ABDOMINAL HYSTERECTOMY     CATARACT EXTRACTION     COLONOSCOPY N/A 12/10/2013   West Bali, MD ,Gi revealed redundant colon   TOTAL KNEE ARTHROPLASTY Right 03/30/2016   Procedure: TOTAL KNEE ARTHROPLASTY;  Surgeon: Vickki Hearing, MD;  Location: [facility];  Service: Orthopedics;  Laterality: Right;    Social History   Socioeconomic History   Marital status: Divorced    Spouse name: Not on file   Number of children: Not on file   Years of education: Not on file   Highest education level: Not on file  Occupational  History   Occupation: retired   Tobacco Use   Smoking status: Former    Current packs/day: 0.25    Average packs/day: 0.3 packs/day for 3.0 years (0.8 ttl pk-yrs)    Types: Cigarettes   Smokeless tobacco: Never  Vaping Use   Vaping status: Never Used  Substance and Sexual Activity   Alcohol use: No   Drug use: No   Sexual activity: Not Currently    Birth control/protection: Post-menopausal  Other Topics Concern   Not on file  Social History Narrative   Long term resident of [facility]    Social Determinants of Health   Financial Resource Strain: Not on file  Food Insecurity:  Not on file  Transportation Needs: Not on file  Physical Activity: Inactive (02/17/2020)   Exercise Vital Sign    Days of Exercise per Week: 0 days    Minutes of Exercise per Session: 0 min  Stress: Not on file  Social Connections: Not on file  Intimate Partner Violence: Not At Risk (02/17/2020)   Humiliation, Afraid, Rape, and Kick questionnaire    Fear of Current or Ex-Partner: No    Emotionally Abused: No    Physically Abused: No    Sexually Abused: No   Family History  Problem Relation Age of Onset   Cancer Mother    Arthritis Mother    Hypotension Father    Cancer Sister    Hypotension Sister    Colon cancer Neg Hx       VITAL SIGNS BP (!) 148/76   Pulse (!) 110   Temp 97.8 F (36.6 C)   Resp 20   Ht 5\' 5"  (1.651 m)   Wt 243 lb (110.2 kg)   SpO2 90%   BMI 40.44 kg/m   Outpatient Encounter Medications as of 04/25/2023  Medication Sig   acetaminophen (TYLENOL) 650 MG CR tablet Take 650 mg by mouth 2 (two) times daily.   calcium-vitamin D (OSCAL WITH D) 500-5 MG-MCG tablet Take 1 tablet by mouth daily.   latanoprost (XALATAN) 0.005 % ophthalmic solution Place 1 drop into both eyes at bedtime. wait 5 minutes between multiple eye drops   loratadine (CLARITIN) 10 MG tablet Take 10 mg by mouth daily.   NON FORMULARY Diet: NAS,   omeprazole (PRILOSEC) 20 MG capsule Take 20 mg by mouth 2 (two) times daily before a meal. Special Instructions: *TAKE ON AN EMPTY STOMACH* FOR GERD *DO NOT CRUSH* (FORMULARY SUB FOR PANTOPRAZOLE 40MG )   Vitamin D, Ergocalciferol, (DRISDOL) 1.25 MG (50000 UNIT) CAPS capsule Take 50,000 Units by mouth every 7 (seven) days.   No facility-administered encounter medications on file as of 04/25/2023.     SIGNIFICANT DIAGNOSTIC EXAMS  PREVIOUS  03-08-22: dexa scan t score -2.346   NO NEW EXAMS    LABS REVIEWED PREVIOUS;     07-06-22: urine micro-albumin 15.6 08-11-22: wbc 5.7; hgb 11.7; hct 37.8; mcv 88.3 plt 316; glucose 86; bun 17; creat  0.62; k+ 4.1; na++ 138; ca 8.6; gfr >60 10-12-22: wbc 5.8; hgb 12.8; hct 41.0; mcv 87.0; clump iron 46; tibc 256; ferritin 173 12-07-22; vitamin D: 19.98; hgb A1c 5.9  03-01-23: glucose 93; bun 13; creat 0.56; k+ 3.6; na++ 138; ca 8.4 gfr >60; protein 6.5 albumin 2.9; hgb A1c 6.1 03-03-23: wbc 5.2; hgb 11.4; hct 36.7; mcv 84.4 plt 336; glucose 155; bun 13; creat 0.69; k+ 3.9; na++ 134; ca 8.7; gfr >60; protein 6.7 albumin 3.2 d-dimer >20 CRP 14.6    TODAY  04-18-23: wbc 5.8; hgb 12.0; hct 39.2; mcv 84.5 plt 329; iron 50; tibc 264; ferritin 174    Review of Systems  Constitutional:  Negative for malaise/fatigue.  Respiratory:  Negative for cough and shortness of breath.   Cardiovascular:  Negative for chest pain, palpitations and leg swelling.  Gastrointestinal:  Negative for abdominal pain, constipation and heartburn.  Musculoskeletal:  Negative for back pain, joint pain and myalgias.  Skin: Negative.   Neurological:  Negative for dizziness.  Psychiatric/Behavioral:  The patient is not nervous/anxious.    Physical Exam Constitutional:      General: She is not in acute distress.    Appearance: She is well-developed. She is morbidly obese. She is not diaphoretic.  Neck:     Thyroid: No thyromegaly.  Cardiovascular:     Rate and Rhythm: Normal rate and regular rhythm.     Pulses: Normal pulses.     Heart sounds: Normal heart sounds.  Pulmonary:     Effort: Pulmonary effort is normal. No respiratory distress.     Breath sounds: Normal breath sounds.  Abdominal:     General: Bowel sounds are normal. There is no distension.     Palpations: Abdomen is soft.     Tenderness: There is no abdominal tenderness.  Musculoskeletal:     Cervical back: Neck supple.     Right lower leg: No edema.     Left lower leg: No edema.     Comments: Contractures lower extremities    Lymphadenopathy:     Cervical: No cervical adenopathy.  Skin:    General: Skin is warm and dry.  Neurological:     Mental  Status: She is alert. Mental status is at baseline.  Psychiatric:        Mood and Affect: Mood normal.      ASSESSMENT/ PLAN:  TODAY;   Aortic atherosclerosis (ct 09-03-19)  2. Hypokalemia k+ 4.1 will monitor   3. Increased ocular pressure bilateral: will continue xalatan to both eyes   PREVIOUS   4. Primary osteoarthritis right knee: will continue tylenol 650 mg twice daily   5. Unspecified protein calorie malnutrition: albumin 3.2 protein 7.0   6. Allergic rhinitis: will continue claritin 10 mg daily   7. Hypertension associated with type 2 diabetes mellitus: b/p 125/70 will continue asa 81 mg daily   8.  Type 2 diabetes mellitus with peripheral vascular complications: hgb A1c 5.9  will monitor   9. Chronic anemia: hgb 11.7 will monitor is off iron.   10. GERD without esophagitis: will lower prilosec to 20 mg daily   11. Vitamin D deficiency: level 19.98 will continue vitamin D 50,000 units weekly  12. Urinary urgency: is completely incontinent off myrbetriq  13. Vascular dementia without behavioral disturbance: weight is 265 pounds.     Synthia Innocent NP [facility]   call [phone]

## 2023-05-11 ENCOUNTER — Non-Acute Institutional Stay (SKILLED_NURSING_FACILITY): Payer: Medicare HMO | Admitting: Adult Health

## 2023-05-11 ENCOUNTER — Encounter: Payer: Self-pay | Admitting: Adult Health

## 2023-05-11 DIAGNOSIS — E1151 Type 2 diabetes mellitus with diabetic peripheral angiopathy without gangrene: Secondary | ICD-10-CM | POA: Diagnosis not present

## 2023-05-11 DIAGNOSIS — G8220 Paraplegia, unspecified: Secondary | ICD-10-CM

## 2023-05-11 DIAGNOSIS — D6869 Other thrombophilia: Secondary | ICD-10-CM

## 2023-05-11 DIAGNOSIS — U071 COVID-19: Secondary | ICD-10-CM | POA: Diagnosis not present

## 2023-05-11 NOTE — Progress Notes (Signed)
Location:  [facility] Room Number: 105D Place of Service:  SNF (31)   CODE STATUS: Full code  No Known Allergies  Chief Complaint  Patient presents with   Acute Visit    care plan meeting     HPI:  We have come together for her care plan meeting. BIMS 11/15 mood 4/30: decreased energy at times, some depression. She is nonambulatory with no falls. She dependent assist with her adl care; has limited ROM in right leg. She is incontinent of bladder and bowel. Dietary: independent with meals; regular diet weight is 240.4 pounds; appetite 76-100%. Therapy: none at this time. Activities: is very active. She continues to be followed for her chronic illnesses including: DM (diabetes mellitus) type 2 with peripheral vascular complications    Paraplegia unspecified   Hypercoagulable state due to covid 19   Past Medical History:  Diagnosis Date   Abnormal posture    Per [employer], [facility] Electronic [facility] Records [facility]    Anemia    Cognitive communication deficit    Per [facility], [facility] Records [facility]    Contracture of left knee    Per [facility], [facility] Records [facility]    Diabetes mellitus without complication (HCC)    Difficulty walking    Per [employer], [facility] Records [facility]    Extended spectrum beta lactamase (ESBL) resistance    Per [employer], [facility] Records [facility]    Gastroesophageal reflux disease    Per [employer], [facility] Records [facility]    Glaucoma    Gram negative sepsis (HCC)    Per [employer], [facility] Records [facility]    Hypercholesteremia    Hypertension    Hypokalemia    Infection, bacterial    Per [employer], [facility] Records [facility]    Iron deficiency anemia due to chronic blood loss 11/23/2021   Muscle weakness (generalized)     Per [facility], [facility] Records [facility]    Need for assistance with personal care    Per [employer], [facility] Records [facility]    OAB (overactive bladder)    Per [facility], [facility] Records [facility]    Ocular hypertension, bilateral    Per [employer], [facility] Records [facility]    Osteoarthritis of right knee    Per [facility], [facility] Records [facility]    Paraplegia [facility])    Pyelonephritis, acute    Per [facility], [facility] Records [facility]    Status post total right knee replacement    Unspecified dementia without behavioral disturbance    Per [employer], [facility] Records [facility]    Urgency of urination    Per [employer], [facility] Records [facility]    Wheelchair dependence    Per Ria Bush, [facility] Records [facility]     Past Surgical History:  Procedure Laterality Date   ABDOMINAL HYSTERECTOMY     CATARACT EXTRACTION     COLONOSCOPY N/A 12/10/2013   West Bali, MD ,Gi revealed redundant colon   TOTAL KNEE ARTHROPLASTY Right 03/30/2016   Procedure: TOTAL KNEE ARTHROPLASTY;  Surgeon: Vickki Hearing, MD;  Location: [facility];  Service: Orthopedics;  Laterality: Right;    Social History   Socioeconomic History   Marital status:  Divorced    Spouse name: Not on file   Number of children: Not on file   Years of education: Not on file   Highest education level: Not on file  Occupational History   Occupation: retired   Tobacco Use   Smoking status: Former    Current packs/day: 0.25    Average packs/day: 0.3 packs/day for 3.0 years (0.8 ttl pk-yrs)    Types: Cigarettes   Smokeless tobacco: Never  Vaping Use   Vaping status: Never Used  Substance and Sexual Activity   Alcohol use: No   Drug use: No   Sexual activity: Not Currently     Birth control/protection: Post-menopausal  Other Topics Concern   Not on file  Social History Narrative   Long term resident of [facility]    Social Determinants of Health   Financial Resource Strain: Not on file  Food Insecurity: Not on file  Transportation Needs: Not on file  Physical Activity: Inactive (02/17/2020)   Exercise Vital Sign    Days of Exercise per Week: 0 days    Minutes of Exercise per Session: 0 min  Stress: Not on file  Social Connections: Not on file  Intimate Partner Violence: Not At Risk (02/17/2020)   Humiliation, Afraid, Rape, and Kick questionnaire    Fear of Current or Ex-Partner: No    Emotionally Abused: No    Physically Abused: No    Sexually Abused: No   Family History  Problem Relation Age of Onset   Cancer Mother    Arthritis Mother    Hypotension Father    Cancer Sister    Hypotension Sister    Colon cancer Neg Hx       VITAL SIGNS BP (!) 137/99   Pulse 94   Temp (!) 97 F (36.1 C) (Temporal)   Resp (!) 23   Ht 5\' 5"  (1.651 m)   Wt 240 lb 6.4 oz (109 kg)   SpO2 99%   BMI 40.00 kg/m   Outpatient Encounter Medications as of 05/11/2023  Medication Sig   acetaminophen (TYLENOL) 650 MG CR tablet Take 650 mg by mouth 2 (two) times daily.   calcium-vitamin D (OSCAL WITH D) 500-5 MG-MCG tablet Take 1 tablet by mouth daily.   latanoprost (XALATAN) 0.005 % ophthalmic solution Place 1 drop into both eyes at bedtime. wait 5 minutes between multiple eye drops   loratadine (CLARITIN) 10 MG tablet Take 10 mg by mouth daily.   NON FORMULARY Diet: NAS,   omeprazole (PRILOSEC) 20 MG capsule Take 20 mg by mouth daily.   ELIQUIS 5 MG TABS tablet Take 5 mg by mouth 2 (two) times daily. (Patient not taking: Reported on 05/11/2023)   Vitamin D, Ergocalciferol, (DRISDOL) 1.25 MG (50000 UNIT) CAPS capsule Take 50,000 Units by mouth every 7 (seven) days. (Patient not taking: Reported on 05/11/2023)   No facility-administered encounter medications on file as of  05/11/2023.     SIGNIFICANT DIAGNOSTIC EXAMS  PREVIOUS  03-08-22: dexa scan t score -2.346   NO NEW EXAMS    LABS REVIEWED PREVIOUS;     07-06-22: urine micro-albumin 15.6 08-11-22: wbc 5.7; hgb 11.7; hct 37.8; mcv 88.3 plt 316; glucose 86; bun 17; creat 0.62; k+ 4.1; na++ 138; ca 8.6; gfr >60 10-12-22: wbc 5.8; hgb 12.8; hct 41.0; mcv 87.0; clump iron 46; tibc 256; ferritin 173 12-07-22; vitamin D: 19.98; hgb A1c 5.9  03-01-23: glucose 93; bun 13; creat 0.56; k+ 3.6; na++ 138; ca 8.4 gfr >60;  protein 6.5 albumin 2.9; hgb A1c 6.1 03-03-23: wbc 5.2; hgb 11.4; hct 36.7; mcv 84.4 plt 336; glucose 155; bun 13; creat 0.69; k+ 3.9; na++ 134; ca 8.7; gfr >60; protein 6.7 albumin 3.2 d-dimer >20 CRP 14.6   04-18-23: wbc 5.8; hgb 12.0; hct 39.2; mcv 84.5 plt 329; iron 50; tibc 264; ferritin 174  NO NEW LABS.    Review of Systems  Constitutional:  Negative for malaise/fatigue.  Respiratory:  Negative for cough and shortness of breath.   Cardiovascular:  Negative for chest pain, palpitations and leg swelling.  Gastrointestinal:  Negative for abdominal pain, constipation and heartburn.  Musculoskeletal:  Negative for back pain, joint pain and myalgias.  Skin: Negative.   Neurological:  Negative for dizziness.  Psychiatric/Behavioral:  The patient is not nervous/anxious.     Physical Exam Constitutional:      General: She is not in acute distress.    Appearance: She is well-developed. She is morbidly obese. She is not diaphoretic.  Neck:     Thyroid: No thyromegaly.  Cardiovascular:     Rate and Rhythm: Normal rate and regular rhythm.     Pulses: Normal pulses.     Heart sounds: Normal heart sounds.  Pulmonary:     Effort: Pulmonary effort is normal. No respiratory distress.     Breath sounds: Normal breath sounds.  Abdominal:     General: Bowel sounds are normal. There is no distension.     Palpations: Abdomen is soft.     Tenderness: There is no abdominal tenderness.   Musculoskeletal:     Cervical back: Neck supple.     Right lower leg: No edema.     Left lower leg: No edema.     Comments: Contractures lower extremities     Lymphadenopathy:     Cervical: No cervical adenopathy.  Skin:    General: Skin is warm and dry.  Neurological:     Mental Status: She is alert. Mental status is at baseline.  Psychiatric:        Mood and Affect: Mood normal.    ASSESSMENT/ PLAN:  TODAY  DM (diabetes mellitus) type 2 with peripheral vascular complications Paraplegia unspecified Hypercoagulable state due to covid 19   Will continue current medications Will continue current plan of care Will continue to monitor her status.   Time spent with patient: 40 minutes: medications; care plan dietary    Synthia Innocent NP [facility]   call [phone]

## 2023-05-29 ENCOUNTER — Non-Acute Institutional Stay (SKILLED_NURSING_FACILITY): Payer: Medicare HMO | Admitting: Adult Health

## 2023-05-29 DIAGNOSIS — M1711 Unilateral primary osteoarthritis, right knee: Secondary | ICD-10-CM

## 2023-05-29 DIAGNOSIS — R634 Abnormal weight loss: Secondary | ICD-10-CM

## 2023-05-29 DIAGNOSIS — E441 Mild protein-calorie malnutrition: Secondary | ICD-10-CM

## 2023-05-30 ENCOUNTER — Encounter: Payer: Self-pay | Admitting: Adult Health

## 2023-05-30 DIAGNOSIS — R634 Abnormal weight loss: Secondary | ICD-10-CM | POA: Insufficient documentation

## 2023-05-30 NOTE — Progress Notes (Signed)
Location:  [facility] Room Number: 105 Place of Service:  SNF (31)   CODE STATUS: full  No Known Allergies  Chief Complaint  Patient presents with   Medical Management of Chronic Issues      Weight loss: Primary osteoarthritis right knee: Unspecified protein calorie malnutrition:    HPI:  She is a 88 year old long term resident of this facility being seen for the management of her chronic illnesses: Weight loss: Primary osteoarthritis right knee: Unspecified protein calorie malnutrition. There are no reports of uncontrolled pain. She has had a mild weight loss of 6 pounds over the past 2 months. This is a beneficial weight loss. There are no reports of changes in appetite.   Past Medical History:  Diagnosis Date   Abnormal posture    Per [employer], [facility] Records [facility]    Anemia    Cognitive communication deficit    Per [facility], [facility]    Contracture of left knee    Per [employer], [facility] Records [facility]    Diabetes mellitus without complication (HCC)    Difficulty walking    Per [employer], [facility] Records [facility]    Extended spectrum beta lactamase (ESBL) resistance    Per [employer], [facility] Records [facility]    Gastroesophageal reflux disease    Per [facility], [facility] Records [facility]    Glaucoma    Gram negative sepsis (HCC)    Per [facility], [facility] Records [facility]    Hypercholesteremia    Hypertension    Hypokalemia    Infection, bacterial    Per [employer], [facility] Records [facility]    Iron deficiency anemia due to chronic blood loss 11/23/2021   Muscle weakness (generalized)    Per [employer], [facility] Records [facility]    Need for assistance with personal care    Per  [employer], [facility] Records [facility]    OAB (overactive bladder)    Per [facility], [facility] Records [facility]    Ocular hypertension, bilateral    Per [employer], [facility] Records [facility]    Osteoarthritis of right knee    Per [employer], [facility] Records [facility]    Paraplegia [facility])    Pyelonephritis, acute    Per [facility], [facility] Records [facility]    Status post total right knee replacement    Unspecified dementia without behavioral disturbance    Per [employer], [facility] Records [facility]    Urgency of urination    Per [employer], [facility]    Wheelchair dependence    Per Ria Bush, [facility] Records [facility]     Past Surgical History:  Procedure Laterality Date   ABDOMINAL HYSTERECTOMY     CATARACT EXTRACTION     COLONOSCOPY N/A 12/10/2013   West Bali, MD ,Gi revealed redundant colon   TOTAL KNEE ARTHROPLASTY Right 03/30/2016   Procedure: TOTAL KNEE ARTHROPLASTY;  Surgeon: Vickki Hearing, MD;  Location: [facility];  Service: Orthopedics;  Laterality: Right;    Social History   Socioeconomic History   Marital status: Divorced    Spouse name: Not on file   Number of children: Not on file   Years  of education: Not on file   Highest education level: Not on file  Occupational History   Occupation: retired   Tobacco Use   Smoking status: Former    Current packs/day: 0.25    Average packs/day: 0.3 packs/day for 3.0 years (0.8 ttl pk-yrs)    Types: Cigarettes   Smokeless tobacco: Never  Vaping Use   Vaping status: Never Used  Substance and Sexual Activity   Alcohol use: No   Drug use: No   Sexual activity: Not Currently    Birth control/protection: Post-menopausal  Other Topics Concern   Not on file  Social History Narrative   Long term  resident of [facility]    Social Determinants of Health   Financial Resource Strain: Not on file  Food Insecurity: Not on file  Transportation Needs: Not on file  Physical Activity: Inactive (02/17/2020)   Exercise Vital Sign    Days of Exercise per Week: 0 days    Minutes of Exercise per Session: 0 min  Stress: Not on file  Social Connections: Not on file  Intimate Partner Violence: Not At Risk (02/17/2020)   Humiliation, Afraid, Rape, and Kick questionnaire    Fear of Current or Ex-Partner: No    Emotionally Abused: No    Physically Abused: No    Sexually Abused: No   Family History  Problem Relation Age of Onset   Cancer Mother    Arthritis Mother    Hypotension Father    Cancer Sister    Hypotension Sister    Colon cancer Neg Hx       VITAL SIGNS BP 116/76   Pulse 98   Temp (!) 97.4 F (36.3 C)   Resp 20   Ht 5\' 5"  (1.651 m)   Wt 237 lb 9.6 oz (107.8 kg)   SpO2 96%   BMI 39.54 kg/m   Outpatient Encounter Medications as of 05/29/2023  Medication Sig   acetaminophen (TYLENOL) 650 MG CR tablet Take 650 mg by mouth 2 (two) times daily.   calcium-vitamin D (OSCAL WITH D) 500-5 MG-MCG tablet Take 1 tablet by mouth daily.   latanoprost (XALATAN) 0.005 % ophthalmic solution Place 1 drop into both eyes at bedtime. wait 5 minutes between multiple eye drops   loratadine (CLARITIN) 10 MG tablet Take 10 mg by mouth daily.   NON FORMULARY Diet: NAS,   [DISCONTINUED] ELIQUIS 5 MG TABS tablet Take 5 mg by mouth 2 (two) times daily. (Patient not taking: Reported on 05/11/2023)   [DISCONTINUED] omeprazole (PRILOSEC) 20 MG capsule Take 20 mg by mouth daily.   [DISCONTINUED] Vitamin D, Ergocalciferol, (DRISDOL) 1.25 MG (50000 UNIT) CAPS capsule Take 50,000 Units by mouth every 7 (seven) days. (Patient not taking: Reported on 05/11/2023)   No facility-administered encounter medications on file as of 05/29/2023.     SIGNIFICANT DIAGNOSTIC EXAMS  PREVIOUS  03-08-22: dexa scan t score  -2.346   NO NEW EXAMS   LABS REVIEWED PREVIOUS;     07-06-22: urine micro-albumin 15.6 08-11-22: wbc 5.7; hgb 11.7; hct 37.8; mcv 88.3 plt 316; glucose 86; bun 17; creat 0.62; k+ 4.1; na++ 138; ca 8.6; gfr >60 10-12-22: wbc 5.8; hgb 12.8; hct 41.0; mcv 87.0; clump iron 46; tibc 256; ferritin 173 12-07-22; vitamin D: 19.98; hgb A1c 5.9  03-01-23: glucose 93; bun 13; creat 0.56; k+ 3.6; na++ 138; ca 8.4 gfr >60; protein 6.5 albumin 2.9; hgb A1c 6.1 03-03-23: wbc 5.2; hgb 11.4; hct 36.7; mcv 84.4 plt 336; glucose 155; bun  13; creat 0.69; k+ 3.9; na++ 134; ca 8.7; gfr >60; protein 6.7 albumin 3.2 d-dimer >20 CRP 14.6   04-18-23: wbc 5.8; hgb 12.0; hct 39.2; mcv 84.5 plt 329; iron 50; tibc 264; ferritin 174  NO NEW LABS.     Review of Systems  Constitutional:  Negative for malaise/fatigue.  Respiratory:  Negative for cough and shortness of breath.   Cardiovascular:  Negative for chest pain, palpitations and leg swelling.  Gastrointestinal:  Negative for abdominal pain, constipation and heartburn.  Musculoskeletal:  Negative for back pain, joint pain and myalgias.  Skin: Negative.   Neurological:  Negative for dizziness.  Psychiatric/Behavioral:  The patient is not nervous/anxious.    Physical Exam Constitutional:      General: She is not in acute distress.    Appearance: She is well-developed. She is obese. She is not diaphoretic.  Neck:     Thyroid: No thyromegaly.  Cardiovascular:     Rate and Rhythm: Normal rate and regular rhythm.     Pulses: Normal pulses.     Heart sounds: Normal heart sounds.  Pulmonary:     Effort: Pulmonary effort is normal. No respiratory distress.     Breath sounds: Normal breath sounds.  Abdominal:     General: Bowel sounds are normal. There is no distension.     Palpations: Abdomen is soft.     Tenderness: There is no abdominal tenderness.  Musculoskeletal:     Cervical back: Neck supple.     Right lower leg: No edema.     Left lower leg: No edema.      Comments: Contractures lower extremities      Lymphadenopathy:     Cervical: No cervical adenopathy.  Skin:    General: Skin is warm and dry.  Neurological:     Mental Status: She is alert. Mental status is at baseline.  Psychiatric:        Mood and Affect: Mood normal.       ASSESSMENT/ PLAN:  TODAY;   Weight loss: she has lost 6 pounds over the past 2 months; she is obese and this weight loss is beneficial.   2. Primary osteoarthritis right knee: will continue tylenol 650 mg twice daily   3. Unspecified protein calorie malnutrition: albumin 3.2 will monitor   PREVIOUS   4. Allergic rhinitis: will continue claritin 10 mg daily   5. Hypertension associated with type 2 diabetes mellitus: b/p 116/76 will continue asa 81 mg daily   6.  Type 2 diabetes mellitus with peripheral vascular complications: hgb A1c 5.9  will monitor   7. Chronic anemia: hgb 11.7 will monitor is off iron.   8. GERD without esophagitis: will lower prilosec to 20 mg daily   9. Vitamin D deficiency: level 19.98 completed vitamin D therapy; will recheck level   10. Urinary urgency: is completely incontinent off myrbetriq  11. Vascular dementia without behavioral disturbance: weight is 237 pounds.   12. Aortic atherosclerosis (ct 09-03-19)  13. Hypokalemia k+ 4.1 will monitor   14. Increased ocular pressure bilateral: will continue xalatan to both eyes     Synthia Innocent NP [facility]  call [phone]

## 2023-05-31 ENCOUNTER — Other Ambulatory Visit (HOSPITAL_COMMUNITY)
Admission: RE | Admit: 2023-05-31 | Discharge: 2023-05-31 | Disposition: A | Discharge status: Home / Self Care | Payer: Medicare HMO | Source: Skilled Nursing Facility | Attending: Adult Health | Admitting: Adult Health

## 2023-05-31 DIAGNOSIS — E559 Vitamin D deficiency, unspecified: Secondary | ICD-10-CM | POA: Diagnosis present

## 2023-05-31 LAB — VITAMIN D 25 HYDROXY (VIT D DEFICIENCY, FRACTURES): Vit D, 25-Hydroxy: 35.45 ng/mL (ref 30–100)

## 2023-06-25 ENCOUNTER — Encounter: Payer: Self-pay | Admitting: Internal Medicine

## 2023-06-25 ENCOUNTER — Non-Acute Institutional Stay (SKILLED_NURSING_FACILITY): Payer: Medicare HMO | Admitting: Internal Medicine

## 2023-06-25 DIAGNOSIS — E1351 Other specified diabetes mellitus with diabetic peripheral angiopathy without gangrene: Secondary | ICD-10-CM

## 2023-06-25 DIAGNOSIS — F01B0 Vascular dementia, moderate, without behavioral disturbance, psychotic disturbance, mood disturbance, and anxiety: Secondary | ICD-10-CM | POA: Diagnosis not present

## 2023-06-25 DIAGNOSIS — I700 Atherosclerosis of aorta: Secondary | ICD-10-CM | POA: Diagnosis not present

## 2023-06-25 DIAGNOSIS — D649 Anemia, unspecified: Secondary | ICD-10-CM

## 2023-06-25 NOTE — Assessment & Plan Note (Signed)
A1c was last checked in August and revealed a value of 6.1%.  A1c will be updated in the near future; A1c goal is < 8% @ her age with advanced co-morbidities. She is on no diabetic therapy.

## 2023-06-25 NOTE — Progress Notes (Unsigned)
NURSING HOME LOCATION:  [facility] ROOM NUMBER:  [id number]  CODE STATUS:  Full Code  PCP:  Synthia Innocent NP  This is a nursing facility follow up visit of chronic medical diagnoses & to document compliance with Regulation 483.30 (c) in The Long Term Care Survey Manual Phase 2 which mandates caregiver visit ( visits can alternate among physician, PA or NP as per statutes) within 10 days of 30 days / 60 days/ 90 days post admission to SNF date    Interim medical record and care since last SNF visit was updated with review of diagnostic studies and change in clinical status since last visit were documented.  HPI: She is a permanent resident of this facility with history of chronic anemia, GERD, history of hypertension, dyslipidemia, OAB, wheelchair dependence, and dementia. Most recent labs were reviewed.  Albumin was 3.2, up from 2.9.  Total protein was normal at 6.7.  Sodium is ranged from 134 up to 138.  Hypocalcemia has improved with current calcium of 8.7 up from 8.4.  Vitamin D level was low normal at 35.45.  The chronic anemia has resolved with current H/H of 12/39.2.  A1c was performed in August and revealed a value of 6.1%. In reference to her dementia; the last CT of the head was performed 12/12/2016.  This revealed mild chronic microvascular changes and mild atrophy.  Review of systems: Dementia invalidated responses.  Staff state she confabulates intermittently.  They report no other behavioral issues. Her only complaint today is that she has frequent bowel movements described as soft to hard. She then went on to confabulate about her youngest son entering the fifth or sixth grade today.  She states that he was born in 2002.  Then she stated that she was supposed to go home today.  Constitutional: No fever, significant weight change, fatigue  Eyes: No redness, discharge, pain, vision change ENT/mouth: No nasal congestion,  purulent discharge, earache, change in hearing,  sore throat  Cardiovascular: No chest pain, palpitations, paroxysmal nocturnal dyspnea, claudication, edema  Respiratory: No cough, sputum production, hemoptysis, DOE, significant snoring, apnea   Gastrointestinal: No heartburn, dysphagia, abdominal pain, nausea /vomiting, rectal bleeding, melena, change in bowels Genitourinary: No dysuria, hematuria, pyuria, incontinence, nocturia Musculoskeletal: No joint stiffness, joint swelling, weakness, pain Dermatologic: No rash, pruritus, change in appearance of skin Neurologic: No dizziness, headache, syncope, seizures, numbness, tingling Psychiatric: No significant anxiety, depression, insomnia, anorexia Endocrine: No change in hair/skin/nails, excessive thirst, excessive hunger, excessive urination  Hematologic/lymphatic: No significant bruising, lymphadenopathy, abnormal bleeding Allergy/immunology: No itchy/watery eyes, significant sneezing, urticaria, angioedema  Physical exam:  Pertinent or positive findings: Morbid obesity is present.  She exhibits a hyponasal speech pattern with word delay.  She is completely edentulous.  Slight tachycardia was present.  She has low-grade rales of the lower lobes.  Abdomen is protuberant.  Pedal pulses are not palpable.  There is nonpitting edema of the lower extremities.  There is fusiform enlargement of the right knee with flexion contracture of the knee.  General appearance: Adequately nourished; no acute distress, increased work of breathing is present.   Lymphatic: No lymphadenopathy about the head, neck, axilla. Eyes: No conjunctival inflammation or lid edema is present. There is no scleral icterus. Ears:  External ear exam shows no significant lesions or deformities.   Nose:  External nasal examination shows no deformity or inflammation. Nasal mucosa are pink and moist without lesions, exudates Oral exam:  Lips and gums are healthy appearing. There  is no oropharyngeal erythema or exudate. Neck:  No  thyromegaly, masses, tenderness noted.    Heart:  Normal rate and regular rhythm. S1 and S2 normal without gallop, murmur, click, rub .  Lungs: Chest clear to auscultation without wheezes, rhonchi, rales, rubs. Abdomen: Bowel sounds are normal. Abdomen is soft and nontender with no organomegaly, hernias, masses. GU: Deferred  Extremities:  No cyanosis, clubbing, edema  Neurologic exam : Cn 2-7 intact Strength equal  in upper & lower extremities Balance, Rhomberg, finger to nose testing could not be completed due to clinical state Deep tendon reflexes are equal Skin: Warm & dry w/o tenting. No significant lesions or rash.  See summary under each active problem in the Problem List with associated updated therapeutic plan

## 2023-06-25 NOTE — Patient Instructions (Signed)
See assessment and plan under each diagnosis in the problem list and acutely for this visit 

## 2023-06-25 NOTE — Assessment & Plan Note (Signed)
She confabulates nonsensically.  She stated that her youngest son was to start the fifth or sixth grade today.  She states he was born in 2002.  Then went on to state that she was supposed to go home today. Staff reports no behavioral issues beyond the confabulation.

## 2023-06-25 NOTE — Assessment & Plan Note (Signed)
She denies any anginal equivalent.  No change indicated.

## 2023-06-25 NOTE — Assessment & Plan Note (Signed)
Current H/H is 12/39.2 indicating resolution of the anemia.  Iron panel was normal.  No bleeding dyscrasias reported by staff.  Continue to monitor.

## 2023-07-10 ENCOUNTER — Other Ambulatory Visit (HOSPITAL_COMMUNITY)
Admission: RE | Admit: 2023-07-10 | Discharge: 2023-07-10 | Disposition: A | Discharge status: Home / Self Care | Payer: Medicare HMO | Source: Skilled Nursing Facility | Attending: Internal Medicine | Admitting: Internal Medicine

## 2023-07-10 DIAGNOSIS — R3915 Urgency of urination: Secondary | ICD-10-CM | POA: Insufficient documentation

## 2023-07-10 LAB — URINALYSIS, W/ REFLEX TO CULTURE (INFECTION SUSPECTED)
Bilirubin Urine: NEGATIVE
Glucose, UA: NEGATIVE mg/dL
Ketones, ur: NEGATIVE mg/dL
Leukocytes,Ua: NEGATIVE
Nitrite: NEGATIVE
Protein, ur: NEGATIVE mg/dL
Specific Gravity, Urine: 1.004 — ABNORMAL LOW (ref 1.005–1.030)
pH: 7 (ref 5.0–8.0)

## 2023-07-19 ENCOUNTER — Other Ambulatory Visit (HOSPITAL_COMMUNITY)
Admission: RE | Admit: 2023-07-19 | Discharge: 2023-07-19 | Disposition: A | Discharge status: Home / Self Care | Payer: Medicare HMO | Source: Skilled Nursing Facility | Attending: Adult Health | Admitting: Adult Health

## 2023-07-19 DIAGNOSIS — E1122 Type 2 diabetes mellitus with diabetic chronic kidney disease: Secondary | ICD-10-CM | POA: Insufficient documentation

## 2023-07-19 LAB — BASIC METABOLIC PANEL
Anion gap: 8 (ref 5–15)
BUN: 18 mg/dL (ref 8–23)
CO2: 24 mmol/L (ref 22–32)
Calcium: 8.6 mg/dL — ABNORMAL LOW (ref 8.9–10.3)
Chloride: 104 mmol/L (ref 98–111)
Creatinine, Ser: 0.72 mg/dL (ref 0.44–1.00)
GFR, Estimated: >60 mL/min (ref >60)
Glucose, Bld: 105 mg/dL — ABNORMAL HIGH (ref 70–99)
Potassium: 3.8 mmol/L (ref 3.5–5.1)
Sodium: 136 mmol/L (ref 135–145)

## 2023-08-01 ENCOUNTER — Non-Acute Institutional Stay (SKILLED_NURSING_FACILITY): Payer: Medicare HMO | Admitting: Adult Health

## 2023-08-01 ENCOUNTER — Encounter: Payer: Self-pay | Admitting: Adult Health

## 2023-08-01 DIAGNOSIS — I152 Hypertension secondary to endocrine disorders: Secondary | ICD-10-CM | POA: Diagnosis not present

## 2023-08-01 DIAGNOSIS — E1159 Type 2 diabetes mellitus with other circulatory complications: Secondary | ICD-10-CM

## 2023-08-01 DIAGNOSIS — J301 Allergic rhinitis due to pollen: Secondary | ICD-10-CM

## 2023-08-01 DIAGNOSIS — E1151 Type 2 diabetes mellitus with diabetic peripheral angiopathy without gangrene: Secondary | ICD-10-CM

## 2023-08-01 NOTE — Progress Notes (Signed)
 Location:  [facility] Room Number: [id number] Place of Service:  SNF (31)   CODE STATUS: full   No Known Allergies  Chief Complaint  Patient presents with   Medical Management of Chronic Issues           Allergic rhinitis:     Hypertension associated with type 2 diabetes mellitus:  Type 2 diabetes mellitus with vascular complications     HPI:  She is a 89 year old long term resident of this facility being seen for the management of her chronic illnesses: Allergic rhinitis:     Hypertension associated with type 2 diabetes mellitus:  Type 2 diabetes mellitus with vascular complications. There are no reports of uncontrolled pain. Her diabetes is well controlled at this time. She is off medications for her hypertension at this time.   Past Medical History:  Diagnosis Date   Abnormal posture    Per [facility], [facility] Records [facility]    Anemia    Cognitive communication deficit    Per [facility], [facility]    Contracture of left knee    Per [facility], [facility]    Difficulty walking    Per [facility], [facility]    Extended spectrum beta lactamase (ESBL) resistance    Per [facility], [facility]    Gastroesophageal reflux disease    Per [facility], [facility]    Glaucoma    Gram negative sepsis (HCC)    Per [facility], [facility]    Hypercholesteremia    Hypertension    Hypokalemia    Infection, bacterial    Per [facility], [facility]    Iron  deficiency anemia due to chronic blood loss 11/23/2021   Muscle weakness (generalized)    Per [facility], [facility]    Need for assistance with personal care     Per [facility], [facility]    OAB (overactive bladder)    Per [facility], [facility]    Ocular hypertension, bilateral    Per [facility], [facility]    Osteoarthritis of right knee    Per [facility], [facility]    Paraplegia [facility])    Pyelonephritis, acute    Per [facility], [facility]    Status post total right knee replacement    Unspecified dementia without behavioral disturbance    Per [facility], [facility]    Urgency of urination    Per [facility], [facility]    Wheelchair dependence    Per [facility], [facility]     Past Surgical History:  Procedure Laterality Date   ABDOMINAL HYSTERECTOMY     CATARACT EXTRACTION     COLONOSCOPY N/A 12/10/2013   Margo LITTIE Haddock, MD ,Gi revealed redundant colon   TOTAL KNEE ARTHROPLASTY Right 03/30/2016   Procedure: TOTAL KNEE ARTHROPLASTY;  Surgeon: Taft FORBES Minerva, MD;  Location: [facility];  Service: Orthopedics;  Laterality: Right;    Social History   Socioeconomic History   Marital status: Divorced    Spouse name: Not on file  Number of children: Not on file   Years of education: Not on file   Highest education level: Not on file  Occupational History   Occupation: retired   Tobacco Use   Smoking status: Former    Current packs/day: 0.25    Average packs/day: 0.3 packs/day for 3.0 years (0.8 ttl pk-yrs)    Types: Cigarettes   Smokeless tobacco: Never  Vaping Use   Vaping status: Never Used  Substance and Sexual Activity   Alcohol use: No   Drug use: No   Sexual activity: Not Currently    Birth control/protection: Post-menopausal  Other Topics Concern   Not on file  Social History Narrative   Long  term resident of [facility]    Social Drivers of Health   Financial Resource Strain: Not on file  Food Insecurity: Not on file  Transportation Needs: Not on file  Physical Activity: Inactive (02/17/2020)   Exercise Vital Sign    Days of Exercise per Week: 0 days    Minutes of Exercise per Session: 0 min  Stress: Not on file  Social Connections: Not on file  Intimate Partner Violence: Not At Risk (02/17/2020)   Humiliation, Afraid, Rape, and Kick questionnaire    Fear of Current or Ex-Partner: No    Emotionally Abused: No    Physically Abused: No    Sexually Abused: No   Family History  Problem Relation Age of Onset   Cancer Mother    Arthritis Mother    Hypotension Father    Cancer Sister    Hypotension Sister    Colon cancer Neg Hx       VITAL SIGNS BP 115/60   Pulse 74   Temp 97.6 F (36.4 C)   Resp 20   Ht 5' 6 (1.676 m)   Wt 227 lb 6.4 oz (103.1 kg)   SpO2 97%   BMI 36.70 kg/m   Outpatient Encounter Medications as of 08/01/2023  Medication Sig   acetaminophen  (TYLENOL ) 650 MG CR tablet Take 650 mg by mouth 2 (two) times daily.   calcium-vitamin D  (OSCAL WITH D) 500-5 MG-MCG tablet Take 1 tablet by mouth daily.   latanoprost  (XALATAN ) 0.005 % ophthalmic solution Place 1 drop into both eyes at bedtime. wait 5 minutes between multiple eye drops   loratadine  (CLARITIN ) 10 MG tablet Take 10 mg by mouth daily.   NON FORMULARY Diet: NAS,   No facility-administered encounter medications on file as of 08/01/2023.     SIGNIFICANT DIAGNOSTIC EXAMS  PREVIOUS  03-08-22: dexa scan t score -2.346   NO NEW EXAMS   LABS REVIEWED PREVIOUS;     07-06-22: urine micro-albumin 15.6 08-11-22: wbc 5.7; hgb 11.7; hct 37.8; mcv 88.3 plt 316; glucose 86; bun 17; creat 0.62; k+ 4.1; na++ 138; ca 8.6; gfr >60 10-12-22: wbc 5.8; hgb 12.8; hct 41.0; mcv 87.0; clump iron  46; tibc 256; ferritin 173 12-07-22; vitamin D : 19.98; hgb A1c 5.9  03-01-23: glucose 93; bun 13; creat 0.56; k+ 3.6; na++  138; ca 8.4 gfr >60; protein 6.5 albumin 2.9; hgb A1c 6.1 03-03-23: wbc 5.2; hgb 11.4; hct 36.7; mcv 84.4 plt 336; glucose 155; bun 13; creat 0.69; k+ 3.9; na++ 134; ca 8.7; gfr >60; protein 6.7 albumin 3.2 d-dimer >20 CRP 14.6   04-18-23: wbc 5.8; hgb 12.0; hct 39.2; mcv 84.5 plt 329; iron  50; tibc 264; ferritin 174  TODAY  05-31-23: vitamin D  35.45 07-19-23: glucose 105; bun 18; creat 0.72; k+ 3.8; na++ 136; ca  8.6; gfr >60   Review of Systems  Constitutional:  Negative for malaise/fatigue.  Respiratory:  Negative for cough and shortness of breath.   Cardiovascular:  Negative for chest pain, palpitations and leg swelling.  Gastrointestinal:  Negative for abdominal pain, constipation and heartburn.  Musculoskeletal:  Negative for back pain, joint pain and myalgias.  Skin: Negative.   Neurological:  Negative for dizziness.  Psychiatric/Behavioral:  The patient is not nervous/anxious.     Physical Exam Constitutional:      General: She is not in acute distress.    Appearance: She is well-developed. She is obese. She is not diaphoretic.  Neck:     Thyroid : No thyromegaly.  Cardiovascular:     Rate and Rhythm: Normal rate and regular rhythm.     Pulses: Normal pulses.     Heart sounds: Normal heart sounds.  Pulmonary:     Effort: Pulmonary effort is normal. No respiratory distress.     Breath sounds: Normal breath sounds.  Abdominal:     General: Bowel sounds are normal. There is no distension.     Palpations: Abdomen is soft.     Tenderness: There is no abdominal tenderness.  Musculoskeletal:     Cervical back: Neck supple.     Right lower leg: No edema.     Left lower leg: No edema.     Comments: Contractures lower extremities       Lymphadenopathy:     Cervical: No cervical adenopathy.  Skin:    General: Skin is warm and dry.  Neurological:     Mental Status: She is alert. Mental status is at baseline.  Psychiatric:        Mood and Affect: Mood normal.          ASSESSMENT/ PLAN:  TODAY;   Allergic rhinitis: will continue claritin  10 mg daily   2. Hypertension associated with type 2 diabetes mellitus: b/p 115/60 will continue asa 81 mg daily   3. Type 2 diabetes mellitus with vascular complications: hgb A1c 5.9; will monitor   PREVIOUS   4. Chronic anemia: hgb 11.7 will monitor is off iron .   5. GERD without esophagitis: will continue  prilosec 20 mg daily   6. Vitamin D  deficiency: level 35.45 completed vitamin D  therapy;   7. Urinary urgency: is completely incontinent off myrbetriq  8. Vascular dementia without behavioral disturbance: weight is 227.4 pounds.   9. Aortic atherosclerosis (ct 09-03-19)  10. Hypokalemia k+ 3.8 will monitor   11. Increased ocular pressure bilateral: will continue xalatan  to both eyes   12. Weight loss: her current weight is 227.4 pounds; will monitor    13. Primary osteoarthritis right knee: will continue tylenol  650 mg twice daily   14. Unspecified protein calorie malnutrition: albumin 3.2 will monitor     Barnie Seip NP [facility]  call [phone]

## 2023-08-02 ENCOUNTER — Non-Acute Institutional Stay (SKILLED_NURSING_FACILITY): Payer: Medicare HMO | Admitting: Adult Health

## 2023-08-02 ENCOUNTER — Encounter: Payer: Self-pay | Admitting: Adult Health

## 2023-08-02 DIAGNOSIS — I152 Hypertension secondary to endocrine disorders: Secondary | ICD-10-CM

## 2023-08-02 DIAGNOSIS — I700 Atherosclerosis of aorta: Secondary | ICD-10-CM

## 2023-08-02 DIAGNOSIS — E1159 Type 2 diabetes mellitus with other circulatory complications: Secondary | ICD-10-CM | POA: Diagnosis not present

## 2023-08-02 DIAGNOSIS — G8220 Paraplegia, unspecified: Secondary | ICD-10-CM | POA: Diagnosis not present

## 2023-08-02 NOTE — Progress Notes (Signed)
 Location:  [facility] Room Number: 105 Place of Service:  SNF (31)   CODE STATUS: full  No Known Allergies  Chief Complaint  Patient presents with   Acute Visit    Care plan meeting     HPI:  We have come together for her care plan meeting. BIMS 5/15 mood 3/30: nervous. Uses wheelchair without falls. She requires dependent assist with her adls. She is incontinent of bladder and bowel. Dietary: feeds self; regular diet appetite 51-100%; weight is 225.6 pounds, has had a desirable weight loss. Therapy: none at this time. She will continue to be followed for her chronic illnesses including:   Aortic atherosclerosis   Hypertension associated with type 2 diabetes mellitus    Paraplegia unspecified  Past Medical History:  Diagnosis Date   Abnormal posture    Per [facility], [facility] Records [facility]    Anemia    Cognitive communication deficit    Per [facility], [facility]    Contracture of left knee    Per [facility], [facility]    Difficulty walking    Per [facility], [facility]    Extended spectrum beta lactamase (ESBL) resistance    Per [facility], [facility]    Gastroesophageal reflux disease    Per [facility], [facility]    Glaucoma    Gram negative sepsis (HCC)    Per [facility], [facility]    Hypercholesteremia    Hypertension    Hypokalemia    Infection, bacterial    Per [facility], [facility]    Iron  deficiency anemia due to chronic blood loss 11/23/2021   Muscle weakness (generalized)    Per [facility], [facility]    Need for assistance with personal care    Per [facility], [facility]  [facility] Records [facility]    OAB (overactive bladder)    Per [facility], [facility]    Ocular hypertension, bilateral    Per [facility], [facility]    Osteoarthritis of right knee    Per [facility], [facility]    Paraplegia [facility])    Pyelonephritis, acute    Per [facility], [facility]    Status post total right knee replacement    Unspecified dementia without behavioral disturbance    Per [facility], [facility]    Urgency of urination    Per [facility], [facility]    Wheelchair dependence    Per [place], [facility] Records [facility]     Past Surgical History:  Procedure Laterality Date   ABDOMINAL HYSTERECTOMY     CATARACT EXTRACTION     COLONOSCOPY N/A 12/10/2013   Margo LITTIE Haddock, MD ,Gi revealed redundant colon   TOTAL KNEE ARTHROPLASTY Right 03/30/2016   Procedure: TOTAL KNEE ARTHROPLASTY;  Surgeon: Taft FORBES Minerva, MD;  Location: [facility];  Service: Orthopedics;  Laterality: Right;    Social History   Socioeconomic History   Marital status: Divorced    Spouse name: Not on file   Number of children: Not on file   Years of education: Not on  file   Highest education level: Not on file  Occupational History   Occupation: retired   Tobacco Use   Smoking status: Former    Current packs/day: 0.25    Average packs/day: 0.3 packs/day for 3.0 years (0.8 ttl pk-yrs)    Types: Cigarettes   Smokeless tobacco: Never  Vaping Use   Vaping status: Never Used  Substance and Sexual Activity   Alcohol use: No   Drug use: No   Sexual activity: Not Currently    Birth control/protection: Post-menopausal  Other Topics Concern   Not on file  Social History Narrative   Long term resident of [facility]    Social  Drivers of Health   Financial Resource Strain: Not on file  Food Insecurity: Not on file  Transportation Needs: Not on file  Physical Activity: Inactive (02/17/2020)   Exercise Vital Sign    Days of Exercise per Week: 0 days    Minutes of Exercise per Session: 0 min  Stress: Not on file  Social Connections: Not on file  Intimate Partner Violence: Not At Risk (02/17/2020)   Humiliation, Afraid, Rape, and Kick questionnaire    Fear of Current or Ex-Partner: No    Emotionally Abused: No    Physically Abused: No    Sexually Abused: No   Family History  Problem Relation Age of Onset   Cancer Mother    Arthritis Mother    Hypotension Father    Cancer Sister    Hypotension Sister    Colon cancer Neg Hx       VITAL SIGNS BP 115/60   Pulse 74   Temp 97.6 F (36.4 C)   Resp 20   Ht 5' (1.524 m)   Wt 227 lb 6.4 oz (103.1 kg)   SpO2 97%   BMI 44.41 kg/m   Outpatient Encounter Medications as of 08/02/2023  Medication Sig   acetaminophen  (TYLENOL ) 650 MG CR tablet Take 650 mg by mouth 2 (two) times daily.   calcium-vitamin D  (OSCAL WITH D) 500-5 MG-MCG tablet Take 1 tablet by mouth daily.   latanoprost  (XALATAN ) 0.005 % ophthalmic solution Place 1 drop into both eyes at bedtime. wait 5 minutes between multiple eye drops   loratadine  (CLARITIN ) 10 MG tablet Take 10 mg by mouth daily.   NON FORMULARY Diet: NAS,   No facility-administered encounter medications on file as of 08/02/2023.     SIGNIFICANT DIAGNOSTIC EXAMS   PREVIOUS  03-08-22: dexa scan t score -2.346   NO NEW EXAMS   LABS REVIEWED PREVIOUS;     07-06-22: urine micro-albumin 15.6 08-11-22: wbc 5.7; hgb 11.7; hct 37.8; mcv 88.3 plt 316; glucose 86; bun 17; creat 0.62; k+ 4.1; na++ 138; ca 8.6; gfr >60 10-12-22: wbc 5.8; hgb 12.8; hct 41.0; mcv 87.0; clump iron  46; tibc 256; ferritin 173 12-07-22; vitamin D : 19.98; hgb A1c 5.9  03-01-23: glucose 93; bun 13; creat 0.56; k+ 3.6; na++ 138; ca 8.4 gfr >60; protein 6.5  albumin 2.9; hgb A1c 6.1 03-03-23: wbc 5.2; hgb 11.4; hct 36.7; mcv 84.4 plt 336; glucose 155; bun 13; creat 0.69; k+ 3.9; na++ 134; ca 8.7; gfr >60; protein 6.7 albumin 3.2 d-dimer >20 CRP 14.6   04-18-23: wbc 5.8; hgb 12.0; hct 39.2; mcv 84.5 plt 329; iron  50; tibc 264; ferritin 174 05-31-23: vitamin D  35.45 07-19-23: glucose 105; bun 18; creat 0.72; k+ 3.8; na++ 136; ca 8.6; gfr >60    Review of Systems  Constitutional:  Negative for malaise/fatigue.  Respiratory:  Negative for cough and shortness of breath.   Cardiovascular:  Negative for chest pain, palpitations and leg swelling.  Gastrointestinal:  Negative for abdominal pain, constipation and heartburn.  Musculoskeletal:  Negative for back pain, joint pain and myalgias.  Skin: Negative.   Neurological:  Negative for dizziness.  Psychiatric/Behavioral:  The patient is not nervous/anxious.    Physical Exam Constitutional:      General: She is not in acute distress.    Appearance: She is well-developed. She is obese. She is not diaphoretic.  Neck:     Thyroid : No thyromegaly.  Cardiovascular:     Rate and Rhythm: Normal rate and regular rhythm.     Pulses: Normal pulses.     Heart sounds: Normal heart sounds.  Pulmonary:     Effort: Pulmonary effort is normal. No respiratory distress.     Breath sounds: Normal breath sounds.  Abdominal:     General: Bowel sounds are normal. There is no distension.     Palpations: Abdomen is soft.     Tenderness: There is no abdominal tenderness.  Musculoskeletal:     Cervical back: Neck supple.     Right lower leg: No edema.     Left lower leg: No edema.     Comments: Contractures lower extremities       Lymphadenopathy:     Cervical: No cervical adenopathy.  Skin:    General: Skin is warm and dry.  Neurological:     Mental Status: She is alert. Mental status is at baseline.  Psychiatric:        Mood and Affect: Mood normal.     ASSESSMENT/ PLAN:  TODAY  Aortic  atherosclerosis Hypertension associated with type 2 diabetes mellitus 3. Paraplegia unspecified   Will continue current medications Will continue current plan of care Will continue to monitor her status.   Time spent with patient: 40 minutes: dietary; medications; plan of care   Barnie Seip NP [facility]  call [phone]

## 2023-08-07 ENCOUNTER — Other Ambulatory Visit (HOSPITAL_COMMUNITY)
Admission: RE | Admit: 2023-08-07 | Discharge: 2023-08-07 | Disposition: A | Discharge status: Home / Self Care | Payer: Medicare HMO | Source: Skilled Nursing Facility | Attending: Adult Health | Admitting: Adult Health

## 2023-08-07 DIAGNOSIS — E1122 Type 2 diabetes mellitus with diabetic chronic kidney disease: Secondary | ICD-10-CM | POA: Insufficient documentation

## 2023-08-08 LAB — MICROALBUMIN / CREATININE URINE RATIO
Creatinine, Urine: 164.5 mg/dL
Microalb Creat Ratio: 17 mg/g{creat} (ref 0–29)
Microalb, Ur: 28 ug/mL — ABNORMAL HIGH

## 2023-09-03 ENCOUNTER — Non-Acute Institutional Stay (SKILLED_NURSING_FACILITY): Payer: Self-pay | Admitting: Adult Health

## 2023-09-03 ENCOUNTER — Encounter: Payer: Self-pay | Admitting: Adult Health

## 2023-09-03 ENCOUNTER — Other Ambulatory Visit (HOSPITAL_COMMUNITY)
Admission: RE | Admit: 2023-09-03 | Discharge: 2023-09-03 | Disposition: A | Discharge status: Home / Self Care | Payer: Medicare HMO | Source: Skilled Nursing Facility | Attending: Adult Health | Admitting: Adult Health

## 2023-09-03 DIAGNOSIS — E1122 Type 2 diabetes mellitus with diabetic chronic kidney disease: Secondary | ICD-10-CM | POA: Diagnosis present

## 2023-09-03 DIAGNOSIS — E559 Vitamin D deficiency, unspecified: Secondary | ICD-10-CM | POA: Diagnosis not present

## 2023-09-03 DIAGNOSIS — K219 Gastro-esophageal reflux disease without esophagitis: Secondary | ICD-10-CM | POA: Diagnosis not present

## 2023-09-03 DIAGNOSIS — D500 Iron deficiency anemia secondary to blood loss (chronic): Secondary | ICD-10-CM

## 2023-09-03 LAB — PROTEIN, TOTAL: Total Protein: 6.5 g/dL (ref 6.5–8.1)

## 2023-09-03 LAB — ALBUMIN: Albumin: 2.9 g/dL — ABNORMAL LOW (ref 3.5–5.0)

## 2023-09-03 NOTE — Progress Notes (Signed)
Location:  [facility] Room Number: [id number] Place of Service:  SNF (31)   CODE STATUS: full   No Known Allergies  Chief Complaint  Patient presents with   Medical Management of Chronic Issues      Chronic anemia: GERD without esophagitis: Vitamin D deficiency     HPI:  She is a 89 year old long term resident of this facility being seen for the management of her chronic illnesses: Chronic anemia: GERD without esophagitis: Vitamin D deficiency. There are no reports of uncontrolled pain. She is tolerating being off PPI. Her weight is without significant change.   Past Medical History:  Diagnosis Date   Abnormal posture    Per [facility], [facility] Records [facility]    Anemia    Cognitive communication deficit    Per [facility], [facility]    Contracture of left knee    Per [facility], [facility]    Difficulty walking    Per [facility], [facility]    Extended spectrum beta lactamase (ESBL) resistance    Per [facility], [facility]    Gastroesophageal reflux disease    Per [facility], [facility]    Glaucoma    Gram negative sepsis (HCC)    Per [facility], [facility] Records [facility]    Hypercholesteremia    Hypertension    Hypokalemia    Infection, bacterial    Per [facility], [facility] Records [facility]    Iron deficiency anemia due to chronic blood loss 11/23/2021   Muscle weakness (generalized)    Per [facility], [facility]    Need for assistance with personal care    Per [facility], [facility]    OAB (overactive bladder)    Per [facility], [facility]     Ocular hypertension, bilateral    Per [facility], [facility]    Osteoarthritis of right knee    Per [facility], [facility]    Paraplegia [facility])    Pyelonephritis, acute    Per [facility], [facility] Records [facility]    Status post total right knee replacement    Unspecified dementia without behavioral disturbance    Per [facility], [facility] Records [facility]    Urgency of urination    Per [facility], [facility]    Wheelchair dependence    Per Ria Bush, [facility] Records [facility]     Past Surgical History:  Procedure Laterality Date   ABDOMINAL HYSTERECTOMY     CATARACT EXTRACTION     COLONOSCOPY N/A 12/10/2013   West Bali, MD ,Gi revealed redundant colon   TOTAL KNEE ARTHROPLASTY Right 03/30/2016   Procedure: TOTAL KNEE ARTHROPLASTY;  Surgeon: Vickki Hearing, MD;  Location: [facility];  Service: Orthopedics;  Laterality: Right;    Social History   Socioeconomic History   Marital status: Divorced    Spouse name: Not on file   Number of children: Not on file   Years of education: Not on file   Highest education level: Not on file  Occupational History   Occupation: retired   Tobacco Use   Smoking  status: Former    Current packs/day: 0.25    Average packs/day: 0.3 packs/day for 3.0 years (0.8 ttl pk-yrs)    Types: Cigarettes   Smokeless tobacco: Never  Vaping Use   Vaping status: Never Used  Substance and Sexual Activity   Alcohol use: No   Drug use: No   Sexual activity: Not Currently    Birth control/protection: Post-menopausal  Other Topics Concern   Not on file  Social History Narrative   Long term resident of [facility]    Social Drivers of Health   Financial Resource Strain: Not on file  Food Insecurity: Not on file  Transportation Needs: Not on file  Physical  Activity: Inactive (02/17/2020)   Exercise Vital Sign    Days of Exercise per Week: 0 days    Minutes of Exercise per Session: 0 min  Stress: Not on file  Social Connections: Not on file  Intimate Partner Violence: Not At Risk (02/17/2020)   Humiliation, Afraid, Rape, and Kick questionnaire    Fear of Current or Ex-Partner: No    Emotionally Abused: No    Physically Abused: No    Sexually Abused: No   Family History  Problem Relation Age of Onset   Cancer Mother    Arthritis Mother    Hypotension Father    Cancer Sister    Hypotension Sister    Colon cancer Neg Hx       VITAL SIGNS BP 130/64   Pulse (!) 106   Temp 98.2 F (36.8 C)   Resp (!) 22   Ht 5' (1.524 m)   Wt 220 lb 6.4 oz (100 kg)   SpO2 95%   BMI 43.04 kg/m   Outpatient Encounter Medications as of 09/03/2023  Medication Sig   acetaminophen (TYLENOL) 650 MG CR tablet Take 650 mg by mouth 2 (two) times daily.   calcium-vitamin D (OSCAL WITH D) 500-5 MG-MCG tablet Take 1 tablet by mouth daily.   latanoprost (XALATAN) 0.005 % ophthalmic solution Place 1 drop into both eyes at bedtime. wait 5 minutes between multiple eye drops   loratadine (CLARITIN) 10 MG tablet Take 10 mg by mouth daily.   NON FORMULARY Diet: NAS,   No facility-administered encounter medications on file as of 09/03/2023.     SIGNIFICANT DIAGNOSTIC EXAMS  PREVIOUS  03-08-22: dexa scan t score -2.346   NO NEW EXAMS   LABS REVIEWED PREVIOUS;     10-12-22: wbc 5.8; hgb 12.8; hct 41.0; mcv 87.0; clump iron 46; tibc 256; ferritin 173 12-07-22; vitamin D: 19.98; hgb A1c 5.9  03-01-23: glucose 93; bun 13; creat 0.56; k+ 3.6; na++ 138; ca 8.4 gfr >60; protein 6.5 albumin 2.9; hgb A1c 6.1 03-03-23: wbc 5.2; hgb 11.4; hct 36.7; mcv 84.4 plt 336; glucose 155; bun 13; creat 0.69; k+ 3.9; na++ 134; ca 8.7; gfr >60; protein 6.7 albumin 3.2 d-dimer >20 CRP 14.6   04-18-23: wbc 5.8; hgb 12.0; hct 39.2; mcv 84.5 plt 329; iron 50; tibc 264; ferritin  174  TODAY  05-31-23: vitamin D 35.45 07-19-23: glucose 105; bun 18; creat 0.72; k+ 3.8; na++ 136; ca 8.6; gfr >60  09-03-23: protein 6.5 albumin 2.9   Review of Systems  Constitutional:  Negative for malaise/fatigue.  Respiratory:  Negative for cough and shortness of breath.   Cardiovascular:  Negative for chest pain, palpitations and leg swelling.  Gastrointestinal:  Negative for abdominal pain, constipation and heartburn.  Musculoskeletal:  Negative for back pain, joint pain and myalgias.  Skin: Negative.   Neurological:  Negative for dizziness.  Psychiatric/Behavioral:  The patient is not nervous/anxious.    Physical Exam Constitutional:      General: She is not in acute distress.    Appearance: She is well-developed. She is morbidly obese. She is not diaphoretic.  Neck:     Thyroid: No thyromegaly.  Cardiovascular:     Rate and Rhythm: Normal rate and regular rhythm.     Pulses: Normal pulses.     Heart sounds: Normal heart sounds.  Pulmonary:     Effort: Pulmonary effort is normal. No respiratory distress.     Breath sounds: Normal breath sounds.  Abdominal:     General: Bowel sounds are normal. There is no distension.     Palpations: Abdomen is soft.     Tenderness: There is no abdominal tenderness.  Musculoskeletal:     Cervical back: Neck supple.     Right lower leg: No edema.     Left lower leg: No edema.     Comments: Contractures lower extremities        Lymphadenopathy:     Cervical: No cervical adenopathy.  Skin:    General: Skin is warm and dry.  Neurological:     Mental Status: She is alert. Mental status is at baseline.  Psychiatric:        Mood and Affect: Mood normal.    ASSESSMENT/ PLAN:  TODAY;   Chronic anemia: hgb 1.17; is off iron  2. GERD without esophagitis: is off PPI   3. Vitamin D deficiency: level 35.45; will monitor   PREVIOUS   4. Urinary urgency: is completely incontinent off myrbetriq  5. Vascular dementia without  behavioral disturbance: weight is 227.4 pounds.   6. Aortic atherosclerosis (ct 09-03-19)  7. Hypokalemia k+ 3.8 will monitor   8. Increased ocular pressure bilateral: will continue xalatan to both eyes   9. Weight loss: her current weight is 220  pounds; will monitor    10. Primary osteoarthritis right knee: will continue tylenol 650 mg twice daily   11. Unspecified protein calorie malnutrition: albumin 2.9 will monitor   12. Allergic rhinitis: will continue claritin 10 mg daily   13. Hypertension associated with type 2 diabetes mellitus: b/p 130/64 will continue asa 81 mg daily   14. Type 2 diabetes mellitus with vascular complications: hgb A1c 5.9; will monitor     Synthia Innocent NP [facility]   call [phone]

## 2023-09-25 ENCOUNTER — Non-Acute Institutional Stay (SKILLED_NURSING_FACILITY): Payer: Self-pay | Admitting: Internal Medicine

## 2023-09-25 ENCOUNTER — Encounter: Payer: Self-pay | Admitting: Internal Medicine

## 2023-09-25 DIAGNOSIS — E441 Mild protein-calorie malnutrition: Secondary | ICD-10-CM

## 2023-09-25 DIAGNOSIS — E1351 Other specified diabetes mellitus with diabetic peripheral angiopathy without gangrene: Secondary | ICD-10-CM

## 2023-09-25 DIAGNOSIS — F01B0 Vascular dementia, moderate, without behavioral disturbance, psychotic disturbance, mood disturbance, and anxiety: Secondary | ICD-10-CM

## 2023-09-25 DIAGNOSIS — D649 Anemia, unspecified: Secondary | ICD-10-CM | POA: Diagnosis not present

## 2023-09-25 DIAGNOSIS — S86811D Strain of other muscle(s) and tendon(s) at lower leg level, right leg, subsequent encounter: Secondary | ICD-10-CM

## 2023-09-25 NOTE — Assessment & Plan Note (Addendum)
 Staff is not reporting bleeding dyscrasias.  As of 04/18/2023 anemia had resolved; H/H was 12.0/39.2.  CBC can be updated at the next blood draw.

## 2023-09-25 NOTE — Patient Instructions (Signed)
 See assessment and plan under each diagnosis in the problem list and acutely for this visit

## 2023-09-25 NOTE — Assessment & Plan Note (Signed)
 There is contracture of the right lower extremity at the knee with limited extension.  There is almost a softball sized mass with suggestion of effusion below the right patella and the upper, medial shin area.  This is nontender to palpation.

## 2023-09-25 NOTE — Progress Notes (Signed)
 NURSING HOME LOCATION:  [facility] ROOM NUMBER:  [id number]  CODE STATUS:  Full Code  PCP: Sharee Holster, NP   This is a nursing facility follow up visit for of chronic medical diagnoses to document compliance with Regulation 483.30 (c) in The Long Term Care Survey Manual Phase 2 which mandates caregiver visit ( visits can alternate among physician, PA or NP as per statutes) within 10 days of 30 days / 60 days/ 90 days post admission to SNF date  .  Interim medical record and care since last SNF visit was updated with review of diagnostic studies and change in clinical status since last visit were documented.  HPI: She is a permanent resident of this facility with medical diagnoses of chronic anemia, essential hypertension, OAB, GERD, aortic atherosclerosis, protein/caloric malnutrition, vascular dementia and diabetes with vascular complications. On 07/19/2023 creatinine was 0.72 and GFR greater than 60 indicating CKD stage II.  On 2/10 albumin had dropped from 3.2-2.9; total protein is low normal at 6.5.  Her most recent A1c on record was prediabetic at 6.1% on 03/01/2023.  In May 2024 it was 5.9%.  She has a history of chronic anemia but this had resolved as of 04/18/2023; H/H was 12.0/39.2.  On 8/10 H/H was 11.4/36.7.  Staff does not report any bleeding dyscrasias.  Review of systems: Dementia invalidated responses.  She stated that she was wheezing "a lot at night."  She could not define the onset or duration of her wheezing.  There are no associated upper respiratory tract symptoms.  She does describe some clear sputum.  She then went on to confabulate that someone had stolen her glasses.  She began to reiterate that she wanted to "go home."  She was concerned about contracture of the right lower extremity; again she cannot tell me the duration of the deficit.  She stated that it was not sore or painful.  Constitutional: No fever, significant weight change, fatigue  Eyes: No  redness, discharge, pain, vision change ENT/mouth: No nasal congestion,  purulent discharge, earache, change in hearing, sore throat  Cardiovascular: No chest pain, palpitations, paroxysmal nocturnal dyspnea, edema  Respiratory: No hemoptysis, significant snoring, apnea   Gastrointestinal: No heartburn, dysphagia, abdominal pain, nausea /vomiting, rectal bleeding, melena, change in bowels Genitourinary: No dysuria, hematuria, pyuria, incontinence, nocturia Dermatologic: No rash, pruritus, change in appearance of skin Neurologic: No dizziness, headache, syncope, seizures, numbness, tingling Psychiatric: No significant anxiety, depression, insomnia, anorexia Endocrine: No change in hair/skin/nails, excessive thirst, excessive hunger, excessive urination  Hematologic/lymphatic: No significant bruising, lymphadenopathy, abnormal bleeding Allergy/immunology: No itchy/watery eyes, significant sneezing, urticaria, angioedema  Physical exam:  Pertinent or positive findings: She is wheelchair-bound.  She tends to lean to the right in wheelchair.  Hair is thin over the crown.  She has dense arcus senilis.  There is slight exotropia of the left eye.  She is edentulous.  Her speech pattern is hyponasal in character with slurring & dysarthria.  Slight gallop cadence is present.  Breath sounds are decreased.  Abdomen is protuberant with central obesity.  Pedal pulses are not palpable.  She has 1/2+ edema at the left sock line.  There is contracture of the right lower extremity.  There is almost a softball sized subcutaneous effusion & mass effect below the right patella at the upper medial shin area.  This was not tender to compression.  She also has flexion contractures of the fingers to variable degrees at the PIP joints.  General  appearance: Adequately nourished; no acute distress, increased work of breathing is present.   Lymphatic: No lymphadenopathy about the head, neck, axilla. Eyes: No conjunctival  inflammation or lid edema is present. There is no scleral icterus. Ears:  External ear exam shows no significant lesions or deformities.   Nose:  External nasal examination shows no deformity or inflammation. Nasal mucosa are pink and moist without lesions, exudates Neck:  No thyromegaly, masses, tenderness noted.    Heart:  No murmur, click, rub .  Lungs:  without wheezes, rhonchi, rales, rubs. Abdomen: Bowel sounds are normal. Abdomen is soft and nontender with no organomegaly, hernias, masses. GU: Deferred  Extremities:  No cyanosis, clubbing  Neurologic exam :Balance, Rhomberg, finger to nose testing could not be completed due to clinical state Skin: Warm & dry w/o tenting. No significant lesions or rash.  See summary under each active problem in the Problem List with associated updated therapeutic plan

## 2023-09-25 NOTE — Assessment & Plan Note (Addendum)
 Despite morbid obesity; protein/caloric malnutrition is present. On 05/02/2024 albumin had dropped from 3.2 down to 2.9.  Total protein was low normal at 6.5.  Nutritionist at SNF continues to monitor.

## 2023-09-25 NOTE — Assessment & Plan Note (Signed)
 Most recent A1c was 6.1% on 03/01/2023; in May 2024 it had been 5.9%.  A1c can be updated with her next blood draw.

## 2023-09-25 NOTE — Assessment & Plan Note (Signed)
 She confabulates about wanting to go home.  Dementia invalidated review of systems.  Any positive responses could not be defined as to duration.  Staff does not report any behavioral issues.  No change in medications indicated.

## 2023-10-05 ENCOUNTER — Other Ambulatory Visit (HOSPITAL_COMMUNITY)
Admission: RE | Admit: 2023-10-05 | Discharge: 2023-10-05 | Disposition: A | Discharge status: Home / Self Care | Source: Skilled Nursing Facility | Attending: Adult Health | Admitting: Adult Health

## 2023-10-05 DIAGNOSIS — M6281 Muscle weakness (generalized): Secondary | ICD-10-CM | POA: Insufficient documentation

## 2023-10-05 LAB — T4, FREE: Free T4: 0.88 ng/dL (ref 0.61–1.12)

## 2023-10-05 LAB — TSH: TSH: 1.824 u[IU]/mL (ref 0.350–4.500)

## 2023-10-06 LAB — T3, FREE: T3, Free: 2.6 pg/mL (ref 2.0–4.4)

## 2023-10-12 ENCOUNTER — Encounter: Payer: Self-pay | Admitting: Adult Health

## 2023-10-12 ENCOUNTER — Non-Acute Institutional Stay (SKILLED_NURSING_FACILITY): Payer: Self-pay | Admitting: Adult Health

## 2023-10-12 ENCOUNTER — Other Ambulatory Visit (HOSPITAL_COMMUNITY)
Admission: RE | Admit: 2023-10-12 | Discharge: 2023-10-12 | Disposition: A | Discharge status: Home / Self Care | Source: Skilled Nursing Facility | Attending: Internal Medicine | Admitting: Internal Medicine

## 2023-10-12 DIAGNOSIS — J1100 Influenza due to unidentified influenza virus with unspecified type of pneumonia: Secondary | ICD-10-CM | POA: Diagnosis not present

## 2023-10-12 DIAGNOSIS — J101 Influenza due to other identified influenza virus with other respiratory manifestations: Secondary | ICD-10-CM | POA: Diagnosis present

## 2023-10-12 LAB — COMPREHENSIVE METABOLIC PANEL
ALT: 25 U/L (ref 0–44)
AST: 33 U/L (ref 15–41)
Albumin: 3.2 g/dL — ABNORMAL LOW (ref 3.5–5.0)
Alkaline Phosphatase: 75 U/L (ref 38–126)
Anion gap: 9 (ref 5–15)
BUN: 13 mg/dL (ref 8–23)
CO2: 28 mmol/L (ref 22–32)
Calcium: 8.8 mg/dL — ABNORMAL LOW (ref 8.9–10.3)
Chloride: 99 mmol/L (ref 98–111)
Creatinine, Ser: 0.57 mg/dL (ref 0.44–1.00)
GFR, Estimated: >60 mL/min (ref >60)
Glucose, Bld: 120 mg/dL — ABNORMAL HIGH (ref 70–99)
Potassium: 3.5 mmol/L (ref 3.5–5.1)
Sodium: 136 mmol/L (ref 135–145)
Total Bilirubin: 0.3 mg/dL (ref 0.0–1.2)
Total Protein: 7.2 g/dL (ref 6.5–8.1)

## 2023-10-12 LAB — CBC WITH DIFFERENTIAL/PLATELET
Abs Immature Granulocytes: 0.01 10*3/uL (ref 0.00–0.07)
Basophils Absolute: 0 10*3/uL (ref 0.0–0.1)
Basophils Relative: 1 %
Eosinophils Absolute: 0.2 10*3/uL (ref 0.0–0.5)
Eosinophils Relative: 4 %
HCT: 38.7 % (ref 36.0–46.0)
Hemoglobin: 11.8 g/dL — ABNORMAL LOW (ref 12.0–15.0)
Immature Granulocytes: 0 %
Lymphocytes Relative: 43 %
Lymphs Abs: 1.7 10*3/uL (ref 0.7–4.0)
MCH: 25.1 pg — ABNORMAL LOW (ref 26.0–34.0)
MCHC: 30.5 g/dL (ref 30.0–36.0)
MCV: 82.3 fL (ref 80.0–100.0)
Monocytes Absolute: 0.5 10*3/uL (ref 0.1–1.0)
Monocytes Relative: 12 %
Neutro Abs: 1.6 10*3/uL — ABNORMAL LOW (ref 1.7–7.7)
Neutrophils Relative %: 40 %
Platelets: 320 10*3/uL (ref 150–400)
RBC: 4.7 MIL/uL (ref 3.87–5.11)
RDW: 15.7 % — ABNORMAL HIGH (ref 11.5–15.5)
WBC: 3.9 10*3/uL — ABNORMAL LOW (ref 4.0–10.5)
nRBC: 0 % (ref 0.0–0.2)

## 2023-10-12 LAB — RESP PANEL BY RT-PCR (RSV, FLU A&B, COVID)  RVPGX2
Influenza A by PCR: POSITIVE — AB
Influenza B by PCR: NEGATIVE
Resp Syncytial Virus by PCR: NEGATIVE
SARS Coronavirus 2 by RT PCR: NEGATIVE

## 2023-10-12 NOTE — Progress Notes (Signed)
 Location:  [facility] Room Number: 106 Place of Service:  SNF (31)   CODE STATUS: full   No Known Allergies  Chief Complaint  Patient presents with   Acute Visit    Influenza A     HPI:  She is losing weight. Her weight in Nov was 240 pounds with current weight at 202.4 pounds. Her appetite remains poor; per the staff stated she did not eat lunch. She has rhonchi and wheezing throughout her lung fields. She denies any coughing; no shortness of breath. She has tested positive for influenza A.   Past Medical History:  Diagnosis Date   Abnormal posture    Per [employer], [facility] Electronic [facility] Records [facility]    Anemia    Cognitive communication deficit    Per [facility], [facility] Records [facility]    Contracture of left knee    Per [facility], [facility] Records [facility]    Difficulty walking    Per [facility], [facility] Records [facility]    Extended spectrum beta lactamase (ESBL) resistance    Per [facility], [facility] Records [facility]    Gastroesophageal reflux disease    Per [facility], [facility] Records [facility]    Glaucoma    Gram negative sepsis (HCC)    Per [facility], [facility] Records [facility]    Hypercholesteremia    Hypertension    Hypokalemia    Infection, bacterial    Per [facility], [facility] Records [facility]    Iron deficiency anemia due to chronic blood loss 11/23/2021   Muscle weakness (generalized)    Per [facility], [facility] Records [facility]    Need for assistance with personal care    Per [facility], [facility] Records [facility]    OAB (overactive bladder)    Per [facility], [facility] Records [facility]    Ocular hypertension, bilateral    Per [facility], [facility]    Osteoarthritis of right knee    Per [facility], [facility] Records [facility]    Paraplegia [facility])    Pyelonephritis, acute    Per [facility], [facility] Records [facility]    Status post total right knee replacement    Unspecified dementia without behavioral disturbance    Per [facility], [facility] Records [facility]    Urgency of urination    Per [facility], [facility] Records [facility]    Wheelchair dependence    Per Ria Bush, [facility] Records [facility]     Past Surgical History:  Procedure Laterality Date   ABDOMINAL HYSTERECTOMY     CATARACT EXTRACTION     COLONOSCOPY N/A 12/10/2013   West Bali, MD ,Gi revealed redundant colon   TOTAL KNEE ARTHROPLASTY Right 03/30/2016   Procedure: TOTAL KNEE ARTHROPLASTY;  Surgeon: Vickki Hearing, MD;  Location: [facility];  Service: Orthopedics;  Laterality: Right;    Social History   Socioeconomic History   Marital status: Divorced    Spouse name: Not on file   Number of children: Not on file   Years of education: Not on file   Highest education level: Not on file  Occupational History   Occupation: retired   Tobacco Use   Smoking status: Former  Current packs/day: 0.25    Average packs/day: 0.3 packs/day for 3.0 years (0.8 ttl pk-yrs)    Types: Cigarettes   Smokeless tobacco: Never  Vaping Use   Vaping status: Never Used  Substance and Sexual Activity   Alcohol use: No   Drug use: No   Sexual activity: Not Currently    Birth control/protection: Post-menopausal  Other Topics Concern   Not on file  Social History Narrative   Long term resident of [facility]    Social Drivers of Health   Financial Resource Strain: Not on file  Food Insecurity: Not on file  Transportation Needs: Not on file  Physical Activity: Inactive (02/17/2020)   Exercise Vital Sign    Days  of Exercise per Week: 0 days    Minutes of Exercise per Session: 0 min  Stress: Not on file  Social Connections: Not on file  Intimate Partner Violence: Not At Risk (02/17/2020)   Humiliation, Afraid, Rape, and Kick questionnaire    Fear of Current or Ex-Partner: No    Emotionally Abused: No    Physically Abused: No    Sexually Abused: No   Family History  Problem Relation Age of Onset   Cancer Mother    Arthritis Mother    Hypotension Father    Cancer Sister    Hypotension Sister    Colon cancer Neg Hx       VITAL SIGNS BP 120/70   Pulse 74   Temp (!) 97.5 F (36.4 C)   Resp 20   Ht 5' (1.524 m)   Wt 202 lb 6.4 oz (91.8 kg)   SpO2 97%   BMI 39.53 kg/m   Outpatient Encounter Medications as of 10/12/2023  Medication Sig   acetaminophen (TYLENOL) 650 MG CR tablet Take 650 mg by mouth 2 (two) times daily.   calcium-vitamin D (OSCAL WITH D) 500-5 MG-MCG tablet Take 1 tablet by mouth daily.   latanoprost (XALATAN) 0.005 % ophthalmic solution Place 1 drop into both eyes at bedtime. wait 5 minutes between multiple eye drops   loratadine (CLARITIN) 10 MG tablet Take 10 mg by mouth daily.   NON FORMULARY Diet: NAS,   No facility-administered encounter medications on file as of 10/12/2023.     SIGNIFICANT DIAGNOSTIC EXAMS  LABS REVIEWED PREVIOUS;     10-12-22: wbc 5.8; hgb 12.8; hct 41.0; mcv 87.0; clump iron 46; tibc 256; ferritin 173 12-07-22; vitamin D: 19.98; hgb A1c 5.9  03-01-23: glucose 93; bun 13; creat 0.56; k+ 3.6; na++ 138; ca 8.4 gfr >60; protein 6.5 albumin 2.9; hgb A1c 6.1 03-03-23: wbc 5.2; hgb 11.4; hct 36.7; mcv 84.4 plt 336; glucose 155; bun 13; creat 0.69; k+ 3.9; na++ 134; ca 8.7; gfr >60; protein 6.7 albumin 3.2 d-dimer >20 CRP 14.6   04-18-23: wbc 5.8; hgb 12.0; hct 39.2; mcv 84.5 plt 329; iron 50; tibc 264; ferritin 174  TODAY  05-31-23: vitamin D 35.45 07-19-23: glucose 105; bun 18; creat 0.72; k+ 3.8; na++ 136; ca 8.6; gfr >60  09-03-23: protein 6.5  albumin 2.9   Review of Systems  Reason unable to perform ROS: unable to fully participate.   Physical Exam Constitutional:      General: She is not in acute distress.    Appearance: She is well-developed. She is obese. She is ill-appearing. She is not diaphoretic.  Neck:     Thyroid: No thyromegaly.  Cardiovascular:     Rate and Rhythm: Normal rate and regular rhythm.  Heart sounds: Normal heart sounds.  Pulmonary:     Effort: Pulmonary effort is normal. No respiratory distress.     Breath sounds: Wheezing and rhonchi present.  Abdominal:     General: Bowel sounds are normal. There is no distension.     Palpations: Abdomen is soft.     Tenderness: There is no abdominal tenderness.  Musculoskeletal:        General: Normal range of motion.     Cervical back: Neck supple.     Right lower leg: No edema.     Left lower leg: No edema.  Lymphadenopathy:     Cervical: No cervical adenopathy.  Skin:    General: Skin is warm and dry.  Neurological:     Mental Status: She is alert. Mental status is at baseline.  Psychiatric:        Mood and Affect: Mood normal.      ASSESSMENT/ PLAN:  TODAY  Pneumonia and influenza:   Will begin duoneb every 6 hours Will begin prednisone 40 mg daily through 10-16-23 Will begin augmentin 875 mg twice daily through 10-19-23 Will begin remeron 7.5 mg nightly through 11-11-23 Will begin tamiflu 75 mg twice daily through 10-16-23    Synthia Innocent NP [facility]   call [phone]

## 2023-10-13 LAB — T3, FREE: T3, Free: 2.8 pg/mL (ref 2.0–4.4)

## 2023-10-15 ENCOUNTER — Encounter: Payer: Self-pay | Admitting: Internal Medicine

## 2023-10-15 ENCOUNTER — Non-Acute Institutional Stay (SKILLED_NURSING_FACILITY): Payer: Self-pay | Admitting: Internal Medicine

## 2023-10-15 DIAGNOSIS — J1100 Influenza due to unidentified influenza virus with unspecified type of pneumonia: Secondary | ICD-10-CM

## 2023-10-15 DIAGNOSIS — F01B0 Vascular dementia, moderate, without behavioral disturbance, psychotic disturbance, mood disturbance, and anxiety: Secondary | ICD-10-CM

## 2023-10-15 NOTE — Progress Notes (Unsigned)
 NURSING HOME LOCATION:  [facility] ROOM NUMBER:  [id number]  CODE STATUS: Full Code  PCP: Sharee Holster, NP   This is a nursing facility follow up visit for specific acute issue of possible HCAP & acute influenza.  Interim medical record and care since last SNF visit was updated with review of diagnostic studies and change in clinical status since last visit were documented.  HPI: There has been an outbreak of influenza A in the facility and this resident tested positive.  She was placed on Tamiflu 75 mg twice daily through 3/25; DuoNebs every 6 hours; prednisone 20 mg twice daily through 3/28; and Augmentin 875 mg twice daily through 3/28. Chest x-ray was performed 10/12/2023; this was personally reviewed by me. There was extremely poor inspiration.  Additionally the head and neck are flexed over the upper lung fields.  Right lower lobe atelectasis suggested.  There is marked enlargement of the right hilum in the context of the poor inspiration.  There is suggestion of a left lower lobe infiltrate and possible effusion. White blood count was 3900; minimal hypochromic anemia was present with an H/H of 11.8/38.7.  Specifically the [facility] was 25.1 with normals of 26-34.  There was no left shift.  Comprehensive metabolic profile revealed mild hyperglycemia with glucose 120.  Minimal hypocalcemia was present with a value of 8.8.  Albumin was 3.2 with a normal total protein of 7.2.  Estimated GFR is greater than 60.  Review of systems: Dementia invalidated responses.  She denies any active upper or lower respiratory tract symptoms.  Her only concern is when she can leave the room. Staff reports that O2 sats have been good serially.  Constitutional: No fever, significant weight change, fatigue  Eyes: No redness, discharge, pain, vision change ENT/mouth: No nasal congestion,  purulent discharge, earache, change in hearing, sore throat  Cardiovascular: No chest pain, palpitations,  paroxysmal nocturnal dyspnea, edema  Respiratory: No cough, sputum production, hemoptysis, DOE, significant snoring, apnea   Gastrointestinal: No heartburn, dysphagia, abdominal pain, nausea /vomiting, rectal bleeding, melena, change in bowels Genitourinary: No dysuria, hematuria, pyuria, incontinence, nocturia Dermatologic: No rash, pruritus, change in appearance of skin Neurologic: No dizziness, headache, syncope, seizures, numbness, tingling Psychiatric: No significant  depression, insomnia, anorexia Endocrine: No change in hair/skin/nails, excessive thirst, excessive hunger, excessive urination  Hematologic/lymphatic: No significant bruising, lymphadenopathy, abnormal bleeding Allergy/immunology: No itchy/watery eyes, significant sneezing, urticaria, angioedema  Physical exam:  Pertinent or positive findings: She is edentulous; there is no oropharyngeal erythema or exudate.  She has no cervical or axillary lymph nodes.  She was markedly ticklish on exam.  She has minimal rales at the right lower lobe posteriorly.  Heart rhythm is slightly irregular but rate well-controlled.  Abdomen is protuberant.  General appearance: Adequately nourished; no acute distress, increased work of breathing is present.   Lymphatic: No lymphadenopathy about the head, neck, axilla. Eyes: No conjunctival inflammation or lid edema is present. There is no scleral icterus. Ears:  External ear exam shows no significant lesions or deformities.   Nose:  External nasal examination shows no deformity or inflammation. Nasal mucosa are pink and moist without lesions, exudates Oral exam:  Lips and gums are healthy appearing. There is no oropharyngeal erythema or exudate. Neck:  No thyromegaly, masses, tenderness noted.    Heart:  No gallop, murmur, click, rub .  Lungs:  without wheezes, rhonchi,  rubs. Abdomen: Bowel sounds are normal. Abdomen is soft and nontender with no organomegaly, hernias,  masses. GU: Deferred   Extremities:  No cyanosis, clubbing Skin: Warm & dry w/o tenting. No significant lesions or rash.  See summary under each active problem in the Problem List with associated updated therapeutic plan

## 2023-10-15 NOTE — Assessment & Plan Note (Signed)
 She is on Tamiflu 75 mg twice daily through 3/25; DuoNebs every 6 hours as needed; prednisone 20 mg twice daily through 3/28; and Augmentin 875 mg twice daily PC through 3/28.  She has minimal rales in the right lower lobe.  She remains afebrile and O2 sats are excellent.  Continue to monitor.

## 2023-10-15 NOTE — Patient Instructions (Signed)
 See assessment and plan under each diagnosis in the problem list and acutely for this visit

## 2023-10-16 NOTE — Assessment & Plan Note (Signed)
 She is not cognizant of current respiratory diagnoses & is only concerned about when she can leave isolation.

## 2023-10-24 ENCOUNTER — Non-Acute Institutional Stay (SKILLED_NURSING_FACILITY): Payer: Self-pay | Admitting: Adult Health

## 2023-10-24 ENCOUNTER — Encounter: Payer: Self-pay | Admitting: Adult Health

## 2023-10-24 DIAGNOSIS — E876 Hypokalemia: Secondary | ICD-10-CM

## 2023-10-24 DIAGNOSIS — N3281 Overactive bladder: Secondary | ICD-10-CM | POA: Diagnosis not present

## 2023-10-24 DIAGNOSIS — I700 Atherosclerosis of aorta: Secondary | ICD-10-CM | POA: Diagnosis not present

## 2023-10-24 NOTE — Progress Notes (Unsigned)
 Location:  [facility] Room Number: [id number] Place of Service:  SNF (31)   CODE STATUS: full   No Known Allergies  Chief Complaint  Patient presents with   Medical Management of Chronic Issues        Urinary urgency: Aortic atherosclerosis Hypokalemia     HPI:  She is a 89 year old long term resident of this facility being seen for the management of her chronic illnesses: Urinary urgency: Aortic atherosclerosis Hypokalemia. There are no reports of uncontrolled pain. She remains incontinent of bladder;no reports of agitation.   Past Medical History:  Diagnosis Date   Abnormal posture    Per [employer], [facility] Electronic [facility] Records [facility]    Anemia    Cognitive communication deficit    Per [facility], [facility]    Contracture of left knee    Per [employer], [facility]    Difficulty walking    Per [employer], [facility]    Extended spectrum beta lactamase (ESBL) resistance    Per [employer], [facility] Records [facility]    Gastroesophageal reflux disease    Per [employer], [facility] Records [facility]    Glaucoma    Gram negative sepsis (HCC)    Per [employer], [facility] Records [facility]    Hypercholesteremia    Hypertension    Hypokalemia    Infection, bacterial    Per [employer], [facility] Records [facility]    Iron deficiency anemia due to chronic blood loss 11/23/2021   Muscle weakness (generalized)    Per [facility], [facility]    Need for assistance with personal care    Per [employer], [facility]    OAB (overactive bladder)    Per [facility], [facility]    Ocular hypertension, bilateral     Per [employer], [facility]    Osteoarthritis of right knee    Per [facility], [facility] Records [facility]    Paraplegia [facility])    Pyelonephritis, acute    Per [employer], [facility] Records [facility]    Status post total right knee replacement    Unspecified dementia without behavioral disturbance    Per [employer], [facility] Records [facility]    Urgency of urination    Per [employer], [facility]    Wheelchair dependence    Per Ria [facility], [facility] Records [facility]     Past Surgical History:  Procedure Laterality Date   ABDOMINAL HYSTERECTOMY     CATARACT EXTRACTION     COLONOSCOPY N/A 12/10/2013   West Bali, MD ,Gi revealed redundant colon   TOTAL KNEE ARTHROPLASTY Right 03/30/2016   Procedure: TOTAL KNEE ARTHROPLASTY;  Surgeon: Vickki Hearing, MD;  Location: [facility];  Service: Orthopedics;  Laterality: Right;    Social History   Socioeconomic History   Marital status: Divorced    Spouse name: Not on file   Number of children: Not on file   Years of education: Not on file   Highest education level: Not on file  Occupational History   Occupation: retired   Tobacco Use   Smoking status: Former    Current packs/day: 0.25  Average packs/day: 0.3 packs/day for 3.0 years (0.8 ttl pk-yrs)    Types: Cigarettes   Smokeless tobacco: Never  Vaping Use   Vaping status: Never Used  Substance and Sexual Activity   Alcohol use: No   Drug use: No   Sexual activity: Not Currently    Birth control/protection: Post-menopausal  Other Topics Concern   Not on file  Social History Narrative   Long term resident of [facility]    Social Drivers of Health   Financial Resource Strain: Not on file  Food Insecurity: Not on file  Transportation Needs: Not on file  Physical Activity: Inactive (02/17/2020)    Exercise Vital Sign    Days of Exercise per Week: 0 days    Minutes of Exercise per Session: 0 min  Stress: Not on file  Social Connections: Not on file  Intimate Partner Violence: Not At Risk (02/17/2020)   Humiliation, Afraid, Rape, and Kick questionnaire    Fear of Current or Ex-Partner: No    Emotionally Abused: No    Physically Abused: No    Sexually Abused: No   Family History  Problem Relation Age of Onset   Cancer Mother    Arthritis Mother    Hypotension Father    Cancer Sister    Hypotension Sister    Colon cancer Neg Hx       VITAL SIGNS BP 117/75   Pulse 78   Temp (!) 97.5 F (36.4 C)   Resp 20   Ht 5' (1.524 m)   Wt 204 lb (92.5 kg)   SpO2 98%   BMI 39.84 kg/m   Outpatient Encounter Medications as of 10/24/2023  Medication Sig   acetaminophen (TYLENOL) 650 MG CR tablet Take 650 mg by mouth 2 (two) times daily.   calcium-vitamin D (OSCAL WITH D) 500-5 MG-MCG tablet Take 1 tablet by mouth daily.   ipratropium-albuterol (DUONEB) 0.5-2.5 (3) MG/3ML SOLN Take 3 mLs by nebulization every 6 (six) hours.   latanoprost (XALATAN) 0.005 % ophthalmic solution Place 1 drop into both eyes at bedtime. wait 5 minutes between multiple eye drops   loratadine (CLARITIN) 10 MG tablet Take 10 mg by mouth daily.   mirtazapine (REMERON) 7.5 MG tablet Take 7.5 mg by mouth at bedtime.   NON FORMULARY Diet: NAS,   No facility-administered encounter medications on file as of 10/24/2023.     SIGNIFICANT DIAGNOSTIC EXAMS  LABS REVIEWED PREVIOUS;     10-12-22: wbc 5.8; hgb 12.8; hct 41.0; mcv 87.0; clump iron 46; tibc 256; ferritin 173 12-07-22; vitamin D: 19.98; hgb A1c 5.9  03-01-23: glucose 93; bun 13; creat 0.56; k+ 3.6; na++ 138; ca 8.4 gfr >60; protein 6.5 albumin 2.9; hgb A1c 6.1 03-03-23: wbc 5.2; hgb 11.4; hct 36.7; mcv 84.4 plt 336; glucose 155; bun 13; creat 0.69; k+ 3.9; na++ 134; ca 8.7; gfr >60; protein 6.7 albumin 3.2 d-dimer >20 CRP 14.6   04-18-23: wbc 5.8; hgb 12.0;  hct 39.2; mcv 84.5 plt 329; iron 50; tibc 264; ferritin 174  TODAY  05-31-23: vitamin D 35.45 07-19-23: glucose 105; bun 18; creat 0.72; k+ 3.8; na++ 136; ca 8.6; gfr >60  09-03-23: protein 6.5 albumin 2.9   Review of Systems  Constitutional:  Negative for malaise/fatigue.  Respiratory:  Negative for cough and shortness of breath.   Cardiovascular:  Negative for chest pain, palpitations and leg swelling.  Gastrointestinal:  Negative for abdominal pain, constipation and heartburn.  Musculoskeletal:  Negative for back pain, joint pain and  myalgias.  Skin: Negative.   Neurological:  Negative for dizziness.  Psychiatric/Behavioral:  The patient is not nervous/anxious.    Physical Exam Constitutional:      General: She is not in acute distress.    Appearance: She is well-developed. She is obese. She is not diaphoretic.  Neck:     Thyroid: No thyromegaly.  Cardiovascular:     Rate and Rhythm: Normal rate and regular rhythm.     Pulses: Normal pulses.     Heart sounds: Normal heart sounds.  Pulmonary:     Effort: Pulmonary effort is normal. No respiratory distress.     Breath sounds: Normal breath sounds.  Abdominal:     General: Bowel sounds are normal. There is no distension.     Palpations: Abdomen is soft.     Tenderness: There is no abdominal tenderness.  Musculoskeletal:     Cervical back: Neck supple.     Right lower leg: No edema.     Left lower leg: No edema.     Comments: Able to move extremities   Lymphadenopathy:     Cervical: No cervical adenopathy.  Skin:    General: Skin is warm and dry.  Neurological:     Mental Status: She is alert. Mental status is at baseline.  Psychiatric:        Mood and Affect: Mood normal.          ASSESSMENT/ PLAN:  TODAY  Urinary urgency: is completely incontinent is off myrbetriq  2. Aortic atherosclerosis (ct 09-03-19)  3. Hypokalemia: k+ 3.8 will monitor   PREVIOUS   4. Vascular dementia without behavioral  disturbance: weight is 201pounds.   5. Increased ocular pressure bilateral: will continue xalatan to both eyes   6. Weight loss: her current weight is 201  pounds; will monitor    7. Primary osteoarthritis right knee: will continue tylenol 650 mg twice daily   8. Unspecified protein calorie malnutrition: albumin 2.9 will monitor   9. Allergic rhinitis: will continue claritin 10 mg daily   10. Hypertension associated with type 2 diabetes mellitus: b/p 117/75 will continue asa 81 mg daily   11. Type 2 diabetes mellitus with vascular complications: hgb A1c 5.9; will monitor   12. Chronic anemia: hgb 1.17; is off iron  13. GERD without esophagitis: is off PPI   14. Vitamin D deficiency: level 35.45; will monitor    Synthia Innocent NP [facility]  call [phone]

## 2023-10-25 ENCOUNTER — Non-Acute Institutional Stay (SKILLED_NURSING_FACILITY): Payer: Self-pay | Admitting: Adult Health

## 2023-10-25 ENCOUNTER — Encounter: Payer: Self-pay | Admitting: Adult Health

## 2023-10-25 DIAGNOSIS — R634 Abnormal weight loss: Secondary | ICD-10-CM

## 2023-10-25 DIAGNOSIS — F01C0 Vascular dementia, severe, without behavioral disturbance, psychotic disturbance, mood disturbance, and anxiety: Secondary | ICD-10-CM

## 2023-10-25 NOTE — Progress Notes (Signed)
 Location:  [facility] Room Number: 105 Place of Service:  SNF (31)   CODE STATUS: full code   No Known Allergies  Chief Complaint  Patient presents with   Acute Visit    Weight loss     HPI:  She is losing weight steadily over the past 6 months from 240 pounds to her current weight of 201 pounds. She was started on remeron 7.5 mg for 30 days through 11-11-23. Her appetite is variable. She does continue to get out of bed daily; does socialize with others. She denies any pain or excessive sleepiness. She is taking ensure twice daily   Past Medical History:  Diagnosis Date   Abnormal posture    Per [employer], [facility] Records [facility]    Anemia    Cognitive communication deficit    Per [employer], [facility] Records [facility]    Contracture of left knee    Per [employer], [facility] Records [facility]    Difficulty walking    Per [employer], [facility]    Extended spectrum beta lactamase (ESBL) resistance    Per [employer], [facility] Records [facility]    Gastroesophageal reflux disease    Per [employer], [facility] Records [facility]    Glaucoma    Gram negative sepsis (HCC)    Per [employer], [facility] Records [facility]    Hypercholesteremia    Hypertension    Hypokalemia    Infection, bacterial    Per [employer], [facility] Records [facility]    Iron deficiency anemia due to chronic blood loss 11/23/2021   Muscle weakness (generalized)    Per [employer], [facility] Records [facility]    Need for assistance with personal care    Per [employer], [facility] Records [facility]    OAB (overactive bladder)    Per [employer], [facility] Records [facility]    Ocular hypertension,  bilateral    Per [employer], [facility] Records [facility]    Osteoarthritis of right knee    Per [employer], [facility] Records [facility]    Paraplegia [facility])    Pyelonephritis, acute    Per [employer], [facility] Records [facility]    Status post total right knee replacement    Unspecified dementia without behavioral disturbance    Per [employer], [facility] Records [facility]    Urgency of urination    Per [employer], [facility]    Wheelchair dependence    Per Ria Bush, [facility] Records [facility]     Past Surgical History:  Procedure Laterality Date   ABDOMINAL HYSTERECTOMY     CATARACT EXTRACTION     COLONOSCOPY N/A 12/10/2013   West Bali, MD ,Gi revealed redundant colon   TOTAL KNEE ARTHROPLASTY Right 03/30/2016   Procedure: TOTAL KNEE ARTHROPLASTY;  Surgeon: Vickki Hearing, MD;  Location: [facility];  Service: Orthopedics;  Laterality: Right;    Social History   Socioeconomic History   Marital status: Divorced    Spouse name: Not on file   Number of children: Not on file   Years of education: Not on file   Highest education level: Not on file  Occupational History   Occupation: retired   Tobacco  Use   Smoking status: Former    Current packs/day: 0.25    Average packs/day: 0.3 packs/day for 3.0 years (0.8 ttl pk-yrs)    Types: Cigarettes   Smokeless tobacco: Never  Vaping Use   Vaping status: Never Used  Substance and Sexual Activity   Alcohol use: No   Drug use: No   Sexual activity: Not Currently    Birth control/protection: Post-menopausal  Other Topics Concern   Not on file  Social History Narrative   Long term resident of [facility]    Social Drivers of Health   Financial Resource Strain: Not on file  Food Insecurity: Not on file  Transportation Needs: Not on file  Physical Activity: Inactive  (02/17/2020)   Exercise Vital Sign    Days of Exercise per Week: 0 days    Minutes of Exercise per Session: 0 min  Stress: Not on file  Social Connections: Not on file  Intimate Partner Violence: Not At Risk (02/17/2020)   Humiliation, Afraid, Rape, and Kick questionnaire    Fear of Current or Ex-Partner: No    Emotionally Abused: No    Physically Abused: No    Sexually Abused: No   Family History  Problem Relation Age of Onset   Cancer Mother    Arthritis Mother    Hypotension Father    Cancer Sister    Hypotension Sister    Colon cancer Neg Hx       VITAL SIGNS BP 115/74   Pulse 70   Temp (!) 97.4 F (36.3 C)   Resp 18   Ht 5' (1.524 m)   Wt 201 lb (91.2 kg)   SpO2 98%   BMI 39.26 kg/m   Outpatient Encounter Medications as of 10/25/2023  Medication Sig   acetaminophen (TYLENOL) 650 MG CR tablet Take 650 mg by mouth 2 (two) times daily.   ipratropium-albuterol (DUONEB) 0.5-2.5 (3) MG/3ML SOLN Take 3 mLs by nebulization every 6 (six) hours.   latanoprost (XALATAN) 0.005 % ophthalmic solution Place 1 drop into both eyes at bedtime. wait 5 minutes between multiple eye drops   loratadine (CLARITIN) 10 MG tablet Take 10 mg by mouth daily.   mirtazapine (REMERON) 7.5 MG tablet Take 7.5 mg by mouth at bedtime.   NON FORMULARY Diet: NAS,   [DISCONTINUED] calcium-vitamin D (OSCAL WITH D) 500-5 MG-MCG tablet Take 1 tablet by mouth daily.   No facility-administered encounter medications on file as of 10/25/2023.     SIGNIFICANT DIAGNOSTIC EXAMS  LABS REVIEWED PREVIOUS;     10-12-22: wbc 5.8; hgb 12.8; hct 41.0; mcv 87.0; clump iron 46; tibc 256; ferritin 173 12-07-22; vitamin D: 19.98; hgb A1c 5.9  03-01-23: glucose 93; bun 13; creat 0.56; k+ 3.6; na++ 138; ca 8.4 gfr >60; protein 6.5 albumin 2.9; hgb A1c 6.1 03-03-23: wbc 5.2; hgb 11.4; hct 36.7; mcv 84.4 plt 336; glucose 155; bun 13; creat 0.69; k+ 3.9; na++ 134; ca 8.7; gfr >60; protein 6.7 albumin 3.2 d-dimer >20 CRP 14.6    04-18-23: wbc 5.8; hgb 12.0; hct 39.2; mcv 84.5 plt 329; iron 50; tibc 264; ferritin 174  TODAY  05-31-23: vitamin D 35.45 07-19-23: glucose 105; bun 18; creat 0.72; k+ 3.8; na++ 136; ca 8.6; gfr >60  09-03-23: protein 6.5 albumin 2.9   Review of Systems  Constitutional:  Negative for malaise/fatigue.  Respiratory:  Negative for cough and shortness of breath.   Cardiovascular:  Negative for chest pain, palpitations and leg swelling.  Gastrointestinal:  Negative  for abdominal pain, constipation and heartburn.  Musculoskeletal:  Negative for back pain, joint pain and myalgias.  Skin: Negative.   Neurological:  Negative for dizziness.  Psychiatric/Behavioral:  The patient is not nervous/anxious.    Physical Exam Constitutional:      General: She is not in acute distress.    Appearance: She is well-developed. She is obese. She is not diaphoretic.  Neck:     Thyroid: No thyromegaly.  Cardiovascular:     Rate and Rhythm: Normal rate and regular rhythm.     Pulses: Normal pulses.     Heart sounds: Normal heart sounds.  Pulmonary:     Effort: Pulmonary effort is normal. No respiratory distress.     Breath sounds: Normal breath sounds.  Abdominal:     General: Bowel sounds are normal. There is no distension.     Palpations: Abdomen is soft.     Tenderness: There is no abdominal tenderness.  Musculoskeletal:     Cervical back: Neck supple.     Right lower leg: No edema.     Left lower leg: No edema.     Comments:  Able to move extremities    Lymphadenopathy:     Cervical: No cervical adenopathy.  Skin:    General: Skin is warm and dry.  Neurological:     Mental Status: She is alert. Mental status is at baseline.  Psychiatric:        Mood and Affect: Mood normal.      ASSESSMENT/ PLAN:  TODAY  Severe vascular dementia without behavioral disturbance, psychotic disturbance, mood disturbance or anxiety Weight loss  Will weight 3 times per week Will setup a care plan  meeting with family in order to setup goals of care.    Synthia Innocent NP [facility]  call [phone]

## 2023-10-26 ENCOUNTER — Other Ambulatory Visit (HOSPITAL_COMMUNITY)
Admission: RE | Admit: 2023-10-26 | Discharge: 2023-10-26 | Disposition: A | Discharge status: Home / Self Care | Source: Skilled Nursing Facility | Attending: Adult Health | Admitting: Adult Health

## 2023-10-26 DIAGNOSIS — R634 Abnormal weight loss: Secondary | ICD-10-CM | POA: Insufficient documentation

## 2023-10-27 LAB — CEA: CEA: 2.7 ng/mL (ref 0.0–4.7)

## 2023-10-29 ENCOUNTER — Encounter: Payer: Self-pay | Admitting: Adult Health

## 2023-10-29 ENCOUNTER — Non-Acute Institutional Stay (SKILLED_NURSING_FACILITY): Payer: Self-pay | Admitting: Adult Health

## 2023-10-29 DIAGNOSIS — E441 Mild protein-calorie malnutrition: Secondary | ICD-10-CM | POA: Diagnosis not present

## 2023-10-29 DIAGNOSIS — F01C0 Vascular dementia, severe, without behavioral disturbance, psychotic disturbance, mood disturbance, and anxiety: Secondary | ICD-10-CM

## 2023-10-29 DIAGNOSIS — R634 Abnormal weight loss: Secondary | ICD-10-CM | POA: Diagnosis not present

## 2023-10-29 NOTE — Progress Notes (Unsigned)
 Location:  [facility] Room Number: 105 Place of Service:  SNF (31)   CODE STATUS: full   No Known Allergies  Chief Complaint  Patient presents with   Acute Visit    Care plan meeting     HPI:  We have come together for her care plan meeting. Her family is present. We have had a prolonged discussion regarding her advanced directives. We discussed CPR; hospitalizations; abt; ivf. At this time she has not decided about a feeding tube. This we will monitor over time. She continues to lose weight with her current weight is 203.8 pounds.   Past Medical History:  Diagnosis Date   Abnormal posture    Per [employer], [facility] Records [facility]    Anemia    Cognitive communication deficit    Per [facility], [facility]    Contracture of left knee    Per [employer], [facility]    Difficulty walking    Per [employer], [facility]    Extended spectrum beta lactamase (ESBL) resistance    Per [employer], [facility]    Gastroesophageal reflux disease    Per [facility], [facility]    Glaucoma    Gram negative sepsis (HCC)    Per [facility], [facility] Records [facility]    Hypercholesteremia    Hypertension    Hypokalemia    Infection, bacterial    Per [facility], [facility] Records [facility]    Iron deficiency anemia due to chronic blood loss 11/23/2021   Muscle weakness (generalized)    Per [facility], [facility] Records [facility]    Need for assistance with personal care    Per [facility], [facility] Records [facility]    OAB (overactive bladder)    Per [facility], [facility]    Ocular hypertension,  bilateral    Per [facility], [facility] Records [facility]    Osteoarthritis of right knee    Per [facility], [facility]    Paraplegia [facility])    Pyelonephritis, acute    Per [facility], [facility] Records [facility]    Status post total right knee replacement    Unspecified dementia without behavioral disturbance    Per [facility], [facility] Records [facility]    Urgency of urination    Per [facility], [facility]    Wheelchair dependence    Per Ria Bush, [facility] Records [facility]     Past Surgical History:  Procedure Laterality Date   ABDOMINAL HYSTERECTOMY     CATARACT EXTRACTION     COLONOSCOPY N/A 12/10/2013   West Bali, MD ,Gi revealed redundant colon   TOTAL KNEE ARTHROPLASTY Right 03/30/2016   Procedure: TOTAL KNEE ARTHROPLASTY;  Surgeon: Vickki Hearing, MD;  Location: [facility];  Service: Orthopedics;  Laterality: Right;    Social History   Socioeconomic History   Marital status: Divorced    Spouse name: Not on file   Number of children: Not on file   Years of education: Not on file   Highest education level: Not on file  Occupational History   Occupation: retired   Tobacco Use   Smoking  status: Former    Current packs/day: 0.25    Average packs/day: 0.3 packs/day for 3.0 years (0.8 ttl pk-yrs)    Types: Cigarettes   Smokeless tobacco: Never  Vaping Use   Vaping status: Never Used  Substance and Sexual Activity   Alcohol use: No   Drug use: No   Sexual activity: Not Currently    Birth control/protection: Post-menopausal  Other Topics Concern   Not on file  Social History Narrative   Long term resident of [facility]    Social Drivers of Health   Financial Resource Strain: Not on file  Food Insecurity: Not on file  Transportation Needs: Not on file  Physical Activity: Inactive  (02/17/2020)   Exercise Vital Sign    Days of Exercise per Week: 0 days    Minutes of Exercise per Session: 0 min  Stress: Not on file  Social Connections: Not on file  Intimate Partner Violence: Not At Risk (02/17/2020)   Humiliation, Afraid, Rape, and Kick questionnaire    Fear of Current or Ex-Partner: No    Emotionally Abused: No    Physically Abused: No    Sexually Abused: No   Family History  Problem Relation Age of Onset   Cancer Mother    Arthritis Mother    Hypotension Father    Cancer Sister    Hypotension Sister    Colon cancer Neg Hx       VITAL SIGNS BP 124/66   Pulse 98   Temp (!) 97.3 F (36.3 C)   Resp 20   Ht 5' (1.524 m)   Wt 203 lb 12.8 oz (92.4 kg)   SpO2 97%   BMI 39.80 kg/m   Outpatient Encounter Medications as of 10/29/2023  Medication Sig   acetaminophen (TYLENOL) 650 MG CR tablet Take 650 mg by mouth 2 (two) times daily.   ipratropium-albuterol (DUONEB) 0.5-2.5 (3) MG/3ML SOLN Take 3 mLs by nebulization every 6 (six) hours.   latanoprost (XALATAN) 0.005 % ophthalmic solution Place 1 drop into both eyes at bedtime. wait 5 minutes between multiple eye drops   loratadine (CLARITIN) 10 MG tablet Take 10 mg by mouth daily.   mirtazapine (REMERON) 7.5 MG tablet Take 7.5 mg by mouth at bedtime.   NON FORMULARY Diet: NAS,   No facility-administered encounter medications on file as of 10/29/2023.     SIGNIFICANT DIAGNOSTIC EXAMS  LABS REVIEWED PREVIOUS;     10-12-22: wbc 5.8; hgb 12.8; hct 41.0; mcv 87.0; clump iron 46; tibc 256; ferritin 173 12-07-22; vitamin D: 19.98; hgb A1c 5.9  03-01-23: glucose 93; bun 13; creat 0.56; k+ 3.6; na++ 138; ca 8.4 gfr >60; protein 6.5 albumin 2.9; hgb A1c 6.1 03-03-23: wbc 5.2; hgb 11.4; hct 36.7; mcv 84.4 plt 336; glucose 155; bun 13; creat 0.69; k+ 3.9; na++ 134; ca 8.7; gfr >60; protein 6.7 albumin 3.2 d-dimer >20 CRP 14.6   04-18-23: wbc 5.8; hgb 12.0; hct 39.2; mcv 84.5 plt 329; iron 50; tibc 264; ferritin  174 05-31-23: vitamin D 35.45 07-19-23: glucose 105; bun 18; creat 0.72; k+ 3.8; na++ 136; ca 8.6; gfr >60  09-03-23: protein 6.5 albumin 2.9   TODAY  10-05-23: tsh 1.824 free t3: 2.6; free t4: 0.88 10-12-23: wbc 3.9; hgb 11.8; hct 82.3 plt 320; glucose 120; bun 13; creat 0.57; k+ 3.5; na++ 136; ca 8.8; gfr >60; protein 7.2 albumin 3.2   Review of Systems  Constitutional:  Negative for malaise/fatigue.  Respiratory:  Negative for cough and shortness  of breath.   Cardiovascular:  Negative for chest pain, palpitations and leg swelling.  Gastrointestinal:  Negative for abdominal pain, constipation and heartburn.  Musculoskeletal:  Negative for back pain, joint pain and myalgias.  Skin: Negative.   Neurological:  Negative for dizziness.  Psychiatric/Behavioral:  The patient is not nervous/anxious.    Physical Exam Constitutional:      General: She is not in acute distress.    Appearance: She is well-developed. She is obese. She is not diaphoretic.  Neck:     Thyroid: No thyromegaly.  Cardiovascular:     Rate and Rhythm: Normal rate and regular rhythm.     Pulses: Normal pulses.     Heart sounds: Normal heart sounds.  Pulmonary:     Effort: Pulmonary effort is normal. No respiratory distress.     Breath sounds: Normal breath sounds.  Abdominal:     General: Bowel sounds are normal. There is no distension.     Palpations: Abdomen is soft.     Tenderness: There is no abdominal tenderness.  Musculoskeletal:     Cervical back: Neck supple.     Right lower leg: No edema.     Left lower leg: No edema.     Comments:  Able to move extremities     Lymphadenopathy:     Cervical: No cervical adenopathy.  Skin:    General: Skin is warm and dry.  Neurological:     Mental Status: She is alert. Mental status is at baseline.  Psychiatric:        Mood and Affect: Mood normal.      ASSESSMENT/ PLAN:  TODAY  Severe vascular dementia without behavioral disturbance; psychotic disturbance   mood disturbance or anxiety Weight loss Mild protein calorie malnutrition  Her MOST form has been filled out; time spent with patient 40 minutes 30 minutes spent with advanced directives.    Synthia Innocent NP [facility]   call [phone]

## 2023-11-07 DIAGNOSIS — I739 Peripheral vascular disease, unspecified: Secondary | ICD-10-CM | POA: Diagnosis not present

## 2023-11-07 DIAGNOSIS — L602 Onychogryphosis: Secondary | ICD-10-CM | POA: Diagnosis not present

## 2023-11-26 ENCOUNTER — Non-Acute Institutional Stay (SKILLED_NURSING_FACILITY): Payer: Self-pay | Admitting: Adult Health

## 2023-11-26 ENCOUNTER — Encounter: Payer: Self-pay | Admitting: Adult Health

## 2023-11-26 DIAGNOSIS — F01C0 Vascular dementia, severe, without behavioral disturbance, psychotic disturbance, mood disturbance, and anxiety: Secondary | ICD-10-CM

## 2023-11-26 DIAGNOSIS — N3281 Overactive bladder: Secondary | ICD-10-CM | POA: Diagnosis not present

## 2023-11-26 DIAGNOSIS — I700 Atherosclerosis of aorta: Secondary | ICD-10-CM

## 2023-11-26 NOTE — Progress Notes (Unsigned)
 Location:  [facility] Room Number: 105 Place of Service:  SNF (31)   CODE STATUS: DNR  No Known Allergies  Chief Complaint  Patient presents with   Medical Management of Chronic Issues         Urinary urgency: Vascular dementia without behavioral disturbance: Aortic atherosclerosis            HPI:  She is a 89 y.o. long term resident of this facility being seen for the management of her chronic illnesses:Urinary urgency: Vascular dementia without behavioral disturbance: Aortic atherosclerosis. There are no reports of uncontrolled pain. She is losing weight to her current weight of 211 pounds. There are no reports of anxiety or depressive thoughts. Her thyroid  and cea labs are normal.    Past Medical History:  Diagnosis Date   Abnormal posture    Per [employer], [facility] Electronic [facility] Records [facility]    Anemia    Cognitive communication deficit    Per [employer], [facility] Records [facility]    Contracture of left knee    Per [employer], [facility] Records [facility]    Difficulty walking    Per [employer], [facility] Records [facility]    Extended spectrum beta lactamase (ESBL) resistance    Per [employer], [facility] Records [facility]    Gastroesophageal reflux disease    Per [employer], [facility] Records [facility]    Glaucoma    Gram negative sepsis (HCC)    Per [facility], [facility] Records [facility]    Hypercholesteremia    Hypertension    Hypokalemia    Infection, bacterial    Per [employer], [facility] Records [facility]    Iron  deficiency anemia due to chronic blood loss 11/23/2021   Muscle weakness (generalized)    Per [employer], [facility] Records [facility]    Need for assistance with personal care    Per [employer], [facility]  Electronic [facility] Records System    OAB (overactive bladder)    Per [employer], [facility] Records [facility]    Ocular hypertension, bilateral    Per [employer], [facility] Records [facility]    Osteoarthritis of right knee    Per [employer], [facility] Records [facility]    Paraplegia [facility])    Pyelonephritis, acute    Per [employer], [facility] Records [facility]    Status post total right knee replacement    Unspecified dementia without behavioral disturbance    Per [employer], [facility] Records [facility]    Urgency of urination    Per [employer], [facility] Records [facility]    Wheelchair dependence    Per Judene Noss, [facility] Electronic [facility] Records [facility]     Past Surgical History:  Procedure Laterality Date   ABDOMINAL HYSTERECTOMY     CATARACT EXTRACTION     COLONOSCOPY N/A 12/10/2013   Alyce Jubilee, MD ,Gi revealed redundant colon   TOTAL KNEE ARTHROPLASTY Right 03/30/2016   Procedure: TOTAL KNEE ARTHROPLASTY;  Surgeon: Darrin Emerald, MD;  Location: [facility];  Service: Orthopedics;  Laterality: Right;    Social History   Socioeconomic History   Marital status: Divorced    Spouse name: Not on file   Number of children: Not on file   Years of education: Not  on file   Highest education level: Not on file  Occupational History   Occupation: retired   Tobacco Use   Smoking status: Former    Current packs/day: 0.25    Average packs/day: 0.3 packs/day for 3.0 years (0.8 ttl pk-yrs)    Types: Cigarettes   Smokeless tobacco: Never  Vaping Use   Vaping status: Never Used  Substance and Sexual Activity   Alcohol use: No   Drug use: No   Sexual activity: Not Currently    Birth control/protection: Post-menopausal  Other Topics Concern   Not on file  Social History Narrative   Long term resident of [facility]    Social  Drivers of Health   Financial Resource Strain: Not on file  Food Insecurity: Not on file  Transportation Needs: Not on file  Physical Activity: Inactive (02/17/2020)   Exercise Vital Sign    Days of Exercise per Week: 0 days    Minutes of Exercise per Session: 0 min  Stress: Not on file  Social Connections: Not on file  Intimate Partner Violence: Not At Risk (02/17/2020)   Humiliation, Afraid, Rape, and Kick questionnaire    Fear of Current or Ex-Partner: No    Emotionally Abused: No    Physically Abused: No    Sexually Abused: No   Family History  Problem Relation Age of Onset   Cancer Mother    Arthritis Mother    Hypotension Father    Cancer Sister    Hypotension Sister    Colon cancer Neg Hx       VITAL SIGNS BP 119/68   Pulse 98   Temp 98.3 F (36.8 C)   Resp 20   Ht 5' (1.524 m)   Wt 211 lb (95.7 kg)   SpO2 97%   BMI 41.21 kg/m   Outpatient Encounter Medications as of 11/26/2023  Medication Sig   acetaminophen  (TYLENOL ) 650 MG CR tablet Take 650 mg by mouth 2 (two) times daily.   Balsam [place]-Castor Oil (VENELEX) OINT Apply 1 Application topically daily in the afternoon. Apply to open areas on her buttock and upper legs   ipratropium-albuterol  (DUONEB) 0.5-2.5 (3) MG/3ML SOLN Take 3 mLs by nebulization every 6 (six) hours.   latanoprost  (XALATAN ) 0.005 % ophthalmic solution Place 1 drop into both eyes at bedtime. wait 5 minutes between multiple eye drops   loratadine  (CLARITIN ) 10 MG tablet Take 10 mg by mouth daily.   NON FORMULARY Diet: NAS,   Nutritional Supplements (ENSURE PO) Take by mouth 2 (two) times daily.   mirtazapine (REMERON) 7.5 MG tablet Take 7.5 mg by mouth at bedtime.   No facility-administered encounter medications on file as of 11/26/2023.     SIGNIFICANT DIAGNOSTIC EXAMS   LABS REVIEWED PREVIOUS;     12-07-22; vitamin D : 19.98; hgb A1c 5.9  03-01-23: glucose 93; bun 13; creat 0.56; k+ 3.6; na++ 138; ca 8.4 gfr >60; protein 6.5 albumin  2.9; hgb A1c 6.1 03-03-23: wbc 5.2; hgb 11.4; hct 36.7; mcv 84.4 plt 336; glucose 155; bun 13; creat 0.69; k+ 3.9; na++ 134; ca 8.7; gfr >60; protein 6.7 albumin 3.2 d-dimer >20 CRP 14.6   04-18-23: wbc 5.8; hgb 12.0; hct 39.2; mcv 84.5 plt 329; iron  50; tibc 264; ferritin 174 05-31-23: vitamin D  35.45 07-19-23: glucose 105; bun 18; creat 0.72; k+ 3.8; na++ 136; ca 8.6; gfr >60  09-03-23: protein 6.5 albumin 2.9  TODAY  10-05-23: tsh 1.824; free t3: 2.6 free t4: 0.88 10-12-23: wbc 3.9;  hgb 11.8; hct 38.7; mcv 82.3 plt 320 glucose 120; bun 13; creat 0.57; k+ 3.5; na++ 136; ca 8.8; gfr >60; protein 7.2 albumin 3.2; free t3: 2.8 10-26-23: CEA 2.7     Review of Systems  Constitutional:  Negative for malaise/fatigue.  Respiratory:  Negative for cough and shortness of breath.   Cardiovascular:  Negative for chest pain, palpitations and leg swelling.  Gastrointestinal:  Negative for abdominal pain, constipation and heartburn.  Musculoskeletal:  Negative for back pain, joint pain and myalgias.  Skin: Negative.   Neurological:  Negative for dizziness.  Psychiatric/Behavioral:  The patient is not nervous/anxious.      Physical Exam Constitutional:      General: She is not in acute distress.    Appearance: She is well-developed. She is morbidly obese. She is not diaphoretic.  Neck:     Thyroid : No thyromegaly.  Cardiovascular:     Rate and Rhythm: Normal rate and regular rhythm.     Pulses: Normal pulses.     Heart sounds: Normal heart sounds.  Pulmonary:     Effort: Pulmonary effort is normal. No respiratory distress.     Breath sounds: Normal breath sounds.  Abdominal:     General: Bowel sounds are normal. There is no distension.     Palpations: Abdomen is soft.     Tenderness: There is no abdominal tenderness.  Musculoskeletal:     Cervical back: Neck supple.     Right lower leg: Edema present.     Left lower leg: Edema present.     Comments:   Able to move extremities       Lymphadenopathy:     Cervical: No cervical adenopathy.  Skin:    General: Skin is warm and dry.  Neurological:     Mental Status: She is alert. Mental status is at baseline.  Psychiatric:        Mood and Affect: Mood normal.       ASSESSMENT/ PLAN:   TODAY;   Urinary urgency: is completely incontinent of bladder is off myrbetriq  2. Vascular dementia without behavioral disturbance: weight is 211 pounds. Is slowly losing weight  3. Aortic atherosclerosis (ct 09-03-19)    PREVIOUS    4. Hypokalemia k+ 3.8 will monitor    5. Increased ocular pressure bilateral: will continue xalatan  to both eyes    6. Weight loss: her current weight is 211  pounds; she is slowly losing weight     7. Primary osteoarthritis right knee: will continue tylenol  650 mg twice daily    8. Unspecified protein calorie malnutrition: albumin 3.2 will monitor    9. Allergic rhinitis: will continue claritin  10 mg daily    10. Hypertension associated with type 2 diabetes mellitus: b/p 130/64 will continue asa 81 mg daily    11. Type 2 diabetes mellitus with vascular complications: hgb A1c 5.9; will monitor    12. Chronic anemia: hgb 11.8; is off iron    13. GERD without esophagitis: is off PPI    14. Vitamin D  deficiency: level 35.45; will monitor     Britt Candle NP [facility]  call [phone]

## 2023-12-25 ENCOUNTER — Encounter: Payer: Self-pay | Admitting: Adult Health

## 2023-12-25 ENCOUNTER — Non-Acute Institutional Stay (SKILLED_NURSING_FACILITY): Payer: Self-pay | Admitting: Adult Health

## 2023-12-25 DIAGNOSIS — R634 Abnormal weight loss: Secondary | ICD-10-CM | POA: Diagnosis not present

## 2023-12-25 DIAGNOSIS — E876 Hypokalemia: Secondary | ICD-10-CM | POA: Diagnosis not present

## 2023-12-25 DIAGNOSIS — H40053 Ocular hypertension, bilateral: Secondary | ICD-10-CM

## 2023-12-25 NOTE — Progress Notes (Signed)
 Location:  [facility] Room Number: [id number] Place of Service:  SNF (31)   CODE STATUS: dnr   No Known Allergies  Chief Complaint  Patient presents with   Medical Management of Chronic Issues         Hypokalemia:   Increased intraocular present bilateral:   Weight loss    HPI:  She is a 89 y.o. long term resident of this facility being seen for the management of her chronic illnesses:Hypokalemia:   Increased intraocular present bilateral:   Weight loss. There are no reports of uncontrolled pain. She continues to lose weight. This is an unfortunate process with the later stages of dementia. There are no reports of anxiety or depressive thoughts.    Past Medical History:  Diagnosis Date   Abnormal posture    Per [facility], [facility] Records [facility]    Anemia    Cognitive communication deficit    Per [facility], [facility]    Contracture of left knee    Per [facility], [facility]    Difficulty walking    Per [facility], [facility]    Extended spectrum beta lactamase (ESBL) resistance    Per [facility], [facility]    Gastroesophageal reflux disease    Per [facility], [facility]    Glaucoma    Gram negative sepsis (HCC)    Per [facility], [facility]    Hypercholesteremia    Hypertension    Hypokalemia    Infection, bacterial    Per [facility], [facility]    Iron  deficiency anemia due to chronic blood loss 11/23/2021   Muscle weakness (generalized)    Per [facility], [facility]    Need for assistance with personal care    Per [facility], [facility]    OAB (overactive  bladder)    Per [facility], [facility]    Ocular hypertension, bilateral    Per [facility], [facility]    Osteoarthritis of right knee    Per [facility], [facility]    Paraplegia [facility])    Pyelonephritis, acute    Per [facility], [facility] Records [facility]    Status post total right knee replacement    Unspecified dementia without behavioral disturbance    Per [facility], [facility]    Urgency of urination    Per [facility], [facility]    Wheelchair dependence    Per Judene Noss, [facility] Records [facility]     Past Surgical History:  Procedure Laterality Date   ABDOMINAL HYSTERECTOMY     CATARACT EXTRACTION     COLONOSCOPY N/A 12/10/2013   Alyce Jubilee, MD ,Gi revealed redundant colon   TOTAL KNEE ARTHROPLASTY Right 03/30/2016   Procedure: TOTAL KNEE ARTHROPLASTY;  Surgeon: Darrin Emerald, MD;  Location: [facility];  Service: Orthopedics;  Laterality: Right;    Social History   Socioeconomic History   Marital status: Divorced    Spouse name: Not on file   Number of children: Not on file   Years of education: Not on file   Highest  education level: Not on file  Occupational History   Occupation: retired   Tobacco Use   Smoking status: Former    Current packs/day: 0.25    Average packs/day: 0.3 packs/day for 3.0 years (0.8 ttl pk-yrs)    Types: Cigarettes   Smokeless tobacco: Never  Vaping Use   Vaping status: Never Used  Substance and Sexual Activity   Alcohol use: No   Drug use: No   Sexual activity: Not Currently    Birth control/protection: Post-menopausal  Other Topics Concern   Not on file  Social History Narrative   Long term resident of [facility]    Social Drivers of Health   Financial Resource Strain: Not on  file  Food Insecurity: Not on file  Transportation Needs: Not on file  Physical Activity: Inactive (02/17/2020)   Exercise Vital Sign    Days of Exercise per Week: 0 days    Minutes of Exercise per Session: 0 min  Stress: Not on file  Social Connections: Not on file  Intimate Partner Violence: Not At Risk (02/17/2020)   Humiliation, Afraid, Rape, and Kick questionnaire    Fear of Current or Ex-Partner: No    Emotionally Abused: No    Physically Abused: No    Sexually Abused: No   Family History  Problem Relation Age of Onset   Cancer Mother    Arthritis Mother    Hypotension Father    Cancer Sister    Hypotension Sister    Colon cancer Neg Hx       VITAL SIGNS BP 120/69   Pulse 81   Temp 97.6 F (36.4 C)   Resp 20   Ht 5' (1.524 m)   Wt 203 lb 12.8 oz (92.4 kg)   SpO2 98%   BMI 39.80 kg/m   Outpatient Encounter Medications as of 12/25/2023  Medication Sig   acetaminophen  (TYLENOL ) 650 MG CR tablet Take 650 mg by mouth 2 (two) times daily.   Balsam [place]-Castor Oil (VENELEX) OINT Apply 1 Application topically daily in the afternoon. Apply to open areas on her buttock and upper legs   ipratropium-albuterol  (DUONEB) 0.5-2.5 (3) MG/3ML SOLN Take 3 mLs by nebulization every 6 (six) hours.   latanoprost  (XALATAN ) 0.005 % ophthalmic solution Place 1 drop into both eyes at bedtime. wait 5 minutes between multiple eye drops   loratadine  (CLARITIN ) 10 MG tablet Take 10 mg by mouth daily.   NON FORMULARY Diet: NAS,   Nutritional Supplements (ENSURE PO) Take by mouth 2 (two) times daily.   [DISCONTINUED] mirtazapine (REMERON) 7.5 MG tablet Take 7.5 mg by mouth at bedtime.   No facility-administered encounter medications on file as of 12/25/2023.     SIGNIFICANT DIAGNOSTIC EXAMS  03-01-23: glucose 93; bun 13; creat 0.56; k+ 3.6; na++ 138; ca 8.4 gfr >60; protein 6.5 albumin 2.9; hgb A1c 6.1 03-03-23: wbc 5.2; hgb 11.4; hct 36.7; mcv 84.4 plt 336; glucose 155; bun 13; creat 0.69; k+  3.9; na++ 134; ca 8.7; gfr >60; protein 6.7 albumin 3.2 d-dimer >20 CRP 14.6   04-18-23: wbc 5.8; hgb 12.0; hct 39.2; mcv 84.5 plt 329; iron  50; tibc 264; ferritin 174 05-31-23: vitamin D  35.45 07-19-23: glucose 105; bun 18; creat 0.72; k+ 3.8; na++ 136; ca 8.6; gfr >60  09-03-23: protein 6.5 albumin 2.9  TODAY  10-05-23: tsh 1.824; free t3: 2.6 free t4: 0.88 10-12-23: wbc 3.9; hgb 11.8; hct 38.7; mcv 82.3 plt 320 glucose 120; bun 13; creat 0.57; k+ 3.5; na++ 136;  ca 8.8; gfr >60; protein 7.2 albumin 3.2; free t3: 2.8 10-26-23: CEA 2.7     Review of Systems  Constitutional:  Negative for malaise/fatigue.  Respiratory:  Negative for cough and shortness of breath.   Cardiovascular:  Negative for chest pain, palpitations and leg swelling.  Gastrointestinal:  Negative for abdominal pain, constipation and heartburn.  Musculoskeletal:  Negative for back pain, joint pain and myalgias.  Skin: Negative.   Neurological:  Negative for dizziness.  Psychiatric/Behavioral:  The patient is not nervous/anxious.    Physical Exam Constitutional:      General: She is not in acute distress.    Appearance: She is well-developed. She is obese. She is not diaphoretic.  Neck:     Thyroid : No thyromegaly.  Cardiovascular:     Rate and Rhythm: Normal rate and regular rhythm.     Heart sounds: Normal heart sounds.  Pulmonary:     Effort: Pulmonary effort is normal. No respiratory distress.     Breath sounds: Normal breath sounds.  Abdominal:     General: Bowel sounds are normal. There is no distension.     Palpations: Abdomen is soft.     Tenderness: There is no abdominal tenderness.  Musculoskeletal:     Cervical back: Neck supple.     Right lower leg: Edema present.     Left lower leg: Edema present.  Lymphadenopathy:     Cervical: No cervical adenopathy.  Skin:    General: Skin is warm and dry.  Neurological:     Mental Status: She is alert. Mental status is at baseline.  Psychiatric:        Mood  and Affect: Mood normal.        ASSESSMENT/ PLAN:   TODAY;   Hypokalemia: k+ 3.8; will monitor  2. Increased intraocular present bilateral: will continue xalatan  to both eyes  3. Weight loss: her current weight is 203 pounds; she continues to slowly lose weight.    PREVIOUS    4. Primary osteoarthritis right knee: will continue tylenol  650 mg twice daily    5. Unspecified protein calorie malnutrition: albumin 3.2 will monitor    6. Allergic rhinitis: will continue claritin  10 mg daily    7. Hypertension associated with type 2 diabetes mellitus: b/p 120/69 will continue asa 81 mg daily    8. Type 2 diabetes mellitus with vascular complications: hgb A1c 5.9; will monitor    9. Chronic anemia: hgb 11.8; is off iron    10. GERD without esophagitis: is off PPI    11. Vitamin D  deficiency: level 35.45; will monitor    12. Urinary urgency: is completely incontinent of bladder is off myrbetriq  13. Vascular dementia without behavioral disturbance: weight is 203 pounds. Is slowly losing weight  14. Aortic atherosclerosis (ct 09-03-19)    Britt Candle NP [facility]   call [phone]

## 2024-01-07 DIAGNOSIS — B351 Tinea unguium: Secondary | ICD-10-CM | POA: Diagnosis not present

## 2024-01-07 DIAGNOSIS — L602 Onychogryphosis: Secondary | ICD-10-CM | POA: Diagnosis not present

## 2024-01-07 DIAGNOSIS — I739 Peripheral vascular disease, unspecified: Secondary | ICD-10-CM | POA: Diagnosis not present

## 2024-01-09 DIAGNOSIS — H401134 Primary open-angle glaucoma, bilateral, indeterminate stage: Secondary | ICD-10-CM | POA: Diagnosis not present

## 2024-01-09 DIAGNOSIS — H2512 Age-related nuclear cataract, left eye: Secondary | ICD-10-CM | POA: Diagnosis not present

## 2024-01-23 ENCOUNTER — Encounter: Payer: Self-pay | Admitting: Adult Health

## 2024-01-23 ENCOUNTER — Non-Acute Institutional Stay (SKILLED_NURSING_FACILITY): Payer: Self-pay | Admitting: Adult Health

## 2024-01-23 DIAGNOSIS — J301 Allergic rhinitis due to pollen: Secondary | ICD-10-CM | POA: Diagnosis not present

## 2024-01-23 DIAGNOSIS — M1711 Unilateral primary osteoarthritis, right knee: Secondary | ICD-10-CM | POA: Diagnosis not present

## 2024-01-23 DIAGNOSIS — E441 Mild protein-calorie malnutrition: Secondary | ICD-10-CM | POA: Diagnosis not present

## 2024-01-23 NOTE — Progress Notes (Signed)
 Location:  [facility] Room Number: 105 N Place of Service:  SNF (31)   CODE STATUS: DNR  No Known Allergies  Chief Complaint  Patient presents with   Medical Management of Chronic Issues            Primary osteoarthritis right knee:  Unspecified protein calorie malnutrition:  Allergic rhinitis:    HPI:  She is a 89 y.o. long term resident of this facility being seen for the management of her chronic illnesses:Primary osteoarthritis right knee:  Unspecified protein calorie malnutrition:  Allergic rhinitis. There are no reports of uncontrolled pain. There are no reports agitation; anxiety. She is slowly declining; is losing weight; is less engaging with others.    Past Medical History:  Diagnosis Date   Abnormal posture    Per [employer], [facility] Records [facility]    Anemia    Cognitive communication deficit    Per [employer], [facility]    Contracture of left knee    Per [employer], [facility]    Difficulty walking    Per [facility], [facility]    Extended spectrum beta lactamase (ESBL) resistance    Per [employer], [facility]    Gastroesophageal reflux disease    Per [employer], [facility]    Glaucoma    Gram negative sepsis (HCC)    Per [facility], [facility] Records [facility]    Hypercholesteremia    Hypertension    Hypokalemia    Infection, bacterial    Per [employer], [facility]    Iron  deficiency anemia due to chronic blood loss 11/23/2021   Muscle weakness (generalized)    Per [employer], [facility] Records [facility]    Need for assistance with personal care    Per [facility], [facility]     OAB (overactive bladder)    Per [facility], [facility]    Ocular hypertension, bilateral    Per [employer], [facility]    Osteoarthritis of right knee    Per [facility], [facility]    Paraplegia [facility])    Pyelonephritis, acute    Per [employer], [facility] Records [facility]    Status post total right knee replacement    Unspecified dementia without behavioral disturbance    Per [employer], [facility]    Urgency of urination    Per [employer], [facility]    Wheelchair dependence    Per [place], [facility] Records [facility]     Past Surgical History:  Procedure Laterality Date   ABDOMINAL HYSTERECTOMY     CATARACT EXTRACTION     COLONOSCOPY N/A 12/10/2013   Margo LITTIE Haddock, MD ,Gi revealed redundant colon   TOTAL KNEE ARTHROPLASTY Right 03/30/2016   Procedure: TOTAL KNEE ARTHROPLASTY;  Surgeon: Taft FORBES Minerva, MD;  Location: [facility];  Service: Orthopedics;  Laterality: Right;    Social History   Socioeconomic History   Marital status: Divorced    Spouse name: Not on file   Number of children: Not on file   Years of education: Not on file   Highest education level:  Not on file  Occupational History   Occupation: retired   Tobacco Use   Smoking status: Former    Current packs/day: 0.25    Average packs/day: 0.3 packs/day for 3.0 years (0.8 ttl pk-yrs)    Types: Cigarettes   Smokeless tobacco: Never  Vaping Use   Vaping status: Never Used  Substance and Sexual Activity   Alcohol use: No   Drug use: No   Sexual activity: Not Currently    Birth control/protection: Post-menopausal  Other Topics Concern   Not on file  Social History Narrative   Long term resident of [facility]    Social Drivers of Health   Financial  Resource Strain: Not on file  Food Insecurity: Not on file  Transportation Needs: Not on file  Physical Activity: Inactive (02/17/2020)   Exercise Vital Sign    Days of Exercise per Week: 0 days    Minutes of Exercise per Session: 0 min  Stress: Not on file  Social Connections: Not on file  Intimate Partner Violence: Not At Risk (02/17/2020)   Humiliation, Afraid, Rape, and Kick questionnaire    Fear of Current or Ex-Partner: No    Emotionally Abused: No    Physically Abused: No    Sexually Abused: No   Family History  Problem Relation Age of Onset   Cancer Mother    Arthritis Mother    Hypotension Father    Cancer Sister    Hypotension Sister    Colon cancer Neg Hx       VITAL SIGNS BP 132/62   Pulse 68   Temp 98.1 F (36.7 C)   Resp 16   Ht 5' (1.524 m)   Wt 196 lb 6.4 oz (89.1 kg)   SpO2 98%   BMI 38.36 kg/m   Outpatient Encounter Medications as of 01/23/2024  Medication Sig   acetaminophen  (TYLENOL ) 650 MG CR tablet Take 650 mg by mouth 2 (two) times daily.   Balsam [place]-Castor Oil (VENELEX) OINT Apply 1 Application topically daily in the afternoon. Apply to open areas on her buttock and upper legs   ipratropium-albuterol  (DUONEB) 0.5-2.5 (3) MG/3ML SOLN Take 3 mLs by nebulization every 6 (six) hours.   latanoprost  (XALATAN ) 0.005 % ophthalmic solution Place 1 drop into both eyes at bedtime. wait 5 minutes between multiple eye drops   loratadine  (CLARITIN ) 10 MG tablet Take 10 mg by mouth daily.   NON FORMULARY Diet: NAS,   Nutritional Supplements (ENSURE PO) Take by mouth 2 (two) times daily.   No facility-administered encounter medications on file as of 01/23/2024.     SIGNIFICANT DIAGNOSTIC EXAMS  03-01-23: glucose 93; bun 13; creat 0.56; k+ 3.6; na++ 138; ca 8.4 gfr >60; protein 6.5 albumin 2.9; hgb A1c 6.1 03-03-23: wbc 5.2; hgb 11.4; hct 36.7; mcv 84.4 plt 336; glucose 155; bun 13; creat 0.69; k+ 3.9; na++ 134; ca 8.7; gfr >60; protein 6.7 albumin 3.2 d-dimer  >20 CRP 14.6   04-18-23: wbc 5.8; hgb 12.0; hct 39.2; mcv 84.5 plt 329; iron  50; tibc 264; ferritin 174 05-31-23: vitamin D  35.45 07-19-23: glucose 105; bun 18; creat 0.72; k+ 3.8; na++ 136; ca 8.6; gfr >60  09-03-23: protein 6.5 albumin 2.9 10-05-23: tsh 1.824; free t3: 2.6 free t4: 0.88 10-12-23: wbc 3.9; hgb 11.8; hct 38.7; mcv 82.3 plt 320 glucose 120; bun 13; creat 0.57; k+ 3.5; na++ 136; ca 8.8; gfr >60; protein 7.2 albumin 3.2; free t3: 2.8 10-26-23: CEA 2.7  NO NEW LABS.  Review of Systems  Constitutional:  Negative for malaise/fatigue.  Respiratory:  Negative for cough and shortness of breath.   Cardiovascular:  Negative for chest pain, palpitations and leg swelling.  Gastrointestinal:  Negative for abdominal pain, constipation and heartburn.  Musculoskeletal:  Negative for back pain, joint pain and myalgias.  Skin: Negative.   Neurological:  Negative for dizziness.  Psychiatric/Behavioral:  The patient is not nervous/anxious.    Physical Exam Constitutional:      General: She is not in acute distress.    Appearance: She is well-developed. She is obese. She is not diaphoretic.  Neck:     Thyroid : No thyromegaly.  Cardiovascular:     Rate and Rhythm: Normal rate and regular rhythm.     Heart sounds: Normal heart sounds.  Pulmonary:     Effort: Pulmonary effort is normal. No respiratory distress.     Breath sounds: Normal breath sounds.  Abdominal:     General: Bowel sounds are normal. There is no distension.     Palpations: Abdomen is soft.     Tenderness: There is no abdominal tenderness.  Musculoskeletal:     Cervical back: Neck supple.     Right lower leg: Edema present.     Left lower leg: Edema present.  Lymphadenopathy:     Cervical: No cervical adenopathy.  Skin:    General: Skin is warm and dry.  Neurological:     Mental Status: She is alert. Mental status is at baseline.  Psychiatric:        Mood and Affect: Mood normal.         ASSESSMENT/ PLAN:    TODAY;    Primary osteoarthritis right knee: will continue tylenol  650 mg twice daily   2. Unspecified protein calorie malnutrition: albumin 3.2 will monitor  3. Allergic rhinitis: will continue claritin  10 mg daily   PREVIOUS    4. Hypertension associated with type 2 diabetes mellitus: b/p 132/62 will continue asa 81 mg daily    5. Type 2 diabetes mellitus with vascular complications: hgb A1c 5.9; will monitor    6. Chronic anemia: hgb 11.8; is off iron    7. GERD without esophagitis: is off PPI    8. Vitamin D  deficiency: level 35.45; will monitor    9. Urinary urgency: is completely incontinent of bladder is off myrbetriq  10. Vascular dementia without behavioral disturbance: weight is 199 pounds. Is slowly losing weight  11. Aortic atherosclerosis (ct 09-03-19)   12. Hypokalemia: k+ 3.8; will monitor  13. Increased intraocular present bilateral: will continue xalatan  to both eyes  14. Weight loss/failure to thrive in adult: her current weight is 199 pounds; she continues to slowly lose weight.     Barnie Seip NP [facility]   call [phone]

## 2024-01-24 ENCOUNTER — Encounter: Payer: Self-pay | Admitting: Adult Health

## 2024-01-24 ENCOUNTER — Non-Acute Institutional Stay (SKILLED_NURSING_FACILITY): Payer: Self-pay | Admitting: Adult Health

## 2024-01-24 DIAGNOSIS — U071 COVID-19: Secondary | ICD-10-CM | POA: Diagnosis not present

## 2024-01-24 DIAGNOSIS — E441 Mild protein-calorie malnutrition: Secondary | ICD-10-CM

## 2024-01-24 DIAGNOSIS — I700 Atherosclerosis of aorta: Secondary | ICD-10-CM | POA: Diagnosis not present

## 2024-01-24 DIAGNOSIS — D6869 Other thrombophilia: Secondary | ICD-10-CM | POA: Diagnosis not present

## 2024-01-24 NOTE — Progress Notes (Signed)
 Location:  [facility] Room Number: 105D Place of Service:  SNF (31)   CODE STATUS: DNR  No Known Allergies  Chief Complaint  Patient presents with   Acute Visit    Care plan meeting     HPI:  We have come together for her care plan meeting. BIMS 11/15 mood 9/30: decreased appetite; nervous; some depression. Is out of bed to wheelchair without fall. She requires dependent assist with her adls. She is incontinent of bladder and bowel. Dietary: regular diet: feeds self appetite >50% on supplements twice daily. Weight is 200.4 pounds is losing weight. Therapy: completed for wheelchair positioning. Activities: is less involved. She will continue to be followed for her chronic illnesses including:   Aortic atherosclerosis   Mild protein calorie malnutrition   Hypercoagulable  state associated with covid 19  Past Medical History:  Diagnosis Date   Abnormal posture    Per [employer], [facility] Electronic Medical Records System    Anemia    Cognitive communication deficit    Per [facility], [facility] Electronic Medical Records [facility]    Contracture of left knee    Per [employer], [facility] Electronic Medical Records [facility]    Difficulty walking    Per [employer], [facility] Electronic Medical Records [facility]    Extended spectrum beta lactamase (ESBL) resistance    Per [employer], [facility] Electronic Medical Records [facility]    Gastroesophageal reflux disease    Per [employer], [facility] Electronic Medical Records System    Glaucoma    Gram negative sepsis (HCC)    Per [employer], [facility] Electronic Medical Records System    Hypercholesteremia    Hypertension    Hypokalemia    Infection, bacterial    Per [employer], [facility] Electronic Medical Records System    Iron  deficiency anemia due to chronic blood loss 11/23/2021   Muscle weakness (generalized)    Per [employer], [facility]  Electronic Medical Records System    Need for assistance with personal care    Per [employer], [facility] Electronic Medical Records System    OAB (overactive bladder)    Per [employer], [facility] Electronic Medical Records System    Ocular hypertension, bilateral    Per [employer], [facility] Electronic Medical Records System    Osteoarthritis of right knee    Per [employer], [facility] Electronic Medical Records System    Paraplegia [facility])    Pyelonephritis, acute    Per [employer], [facility] Electronic Medical Records System    Status post total right knee replacement    Unspecified dementia without behavioral disturbance    Per [employer], [facility] Electronic Medical Records System    Urgency of urination    Per [facility], [facility] Electronic Medical Records System    Wheelchair dependence    Per [place], [facility] Electronic Medical Records System     Past Surgical History:  Procedure Laterality Date   ABDOMINAL HYSTERECTOMY     CATARACT EXTRACTION     COLONOSCOPY N/A 12/10/2013   Margo LITTIE Haddock, MD ,Gi revealed redundant colon   TOTAL KNEE ARTHROPLASTY Right 03/30/2016   Procedure: TOTAL KNEE ARTHROPLASTY;  Surgeon: Taft FORBES Minerva, MD;  Location: [facility];  Service: Orthopedics;  Laterality: Right;    Social History   Socioeconomic History   Marital status: Divorced    Spouse name: Not on file  Number of children: Not on file   Years of education: Not on file   Highest education level: Not on file  Occupational History   Occupation: retired   Tobacco Use   Smoking status: Former    Current packs/day: 0.25    Average packs/day: 0.3 packs/day for 3.0 years (0.8 ttl pk-yrs)    Types: Cigarettes   Smokeless tobacco: Never  Vaping Use   Vaping status: Never Used  Substance and Sexual Activity   Alcohol use: No   Drug use: No   Sexual activity: Not Currently    Birth control/protection:  Post-menopausal  Other Topics Concern   Not on file  Social History Narrative   Long term resident of [facility]    Social Drivers of Health   Financial Resource Strain: Not on file  Food Insecurity: Not on file  Transportation Needs: Not on file  Physical Activity: Inactive (02/17/2020)   Exercise Vital Sign    Days of Exercise per Week: 0 days    Minutes of Exercise per Session: 0 min  Stress: Not on file  Social Connections: Not on file  Intimate Partner Violence: Not At Risk (02/17/2020)   Humiliation, Afraid, Rape, and Kick questionnaire    Fear of Current or Ex-Partner: No    Emotionally Abused: No    Physically Abused: No    Sexually Abused: No   Family History  Problem Relation Age of Onset   Cancer Mother    Arthritis Mother    Hypotension Father    Cancer Sister    Hypotension Sister    Colon cancer Neg Hx       VITAL SIGNS BP 132/62   Pulse 68   Temp 98.1 F (36.7 C)   Resp 16   Ht 5' (1.524 m)   Wt 199 lb 6.4 oz (90.4 kg)   SpO2 98%   BMI 38.94 kg/m   Outpatient Encounter Medications as of 01/24/2024  Medication Sig   acetaminophen  (TYLENOL ) 650 MG CR tablet Take 650 mg by mouth 2 (two) times daily.   Balsam [place]-Castor Oil (VENELEX) OINT Apply 1 Application topically daily in the afternoon. Apply to open areas on her buttock and upper legs   ipratropium-albuterol  (DUONEB) 0.5-2.5 (3) MG/3ML SOLN Take 3 mLs by nebulization every 6 (six) hours.   latanoprost  (XALATAN ) 0.005 % ophthalmic solution Place 1 drop into both eyes at bedtime. wait 5 minutes between multiple eye drops   loratadine  (CLARITIN ) 10 MG tablet Take 10 mg by mouth daily.   NON FORMULARY Diet: NAS,   Nutritional Supplements (ENSURE PO) Take by mouth 2 (two) times daily.   No facility-administered encounter medications on file as of 01/24/2024.     SIGNIFICANT DIAGNOSTIC EXAMS  03-01-23: glucose 93; bun 13; creat 0.56; k+ 3.6; na++ 138; ca 8.4 gfr >60; protein 6.5 albumin 2.9; hgb A1c  6.1 03-03-23: wbc 5.2; hgb 11.4; hct 36.7; mcv 84.4 plt 336; glucose 155; bun 13; creat 0.69; k+ 3.9; na++ 134; ca 8.7; gfr >60; protein 6.7 albumin 3.2 d-dimer >20 CRP 14.6   04-18-23: wbc 5.8; hgb 12.0; hct 39.2; mcv 84.5 plt 329; iron  50; tibc 264; ferritin 174 05-31-23: vitamin D  35.45 07-19-23: glucose 105; bun 18; creat 0.72; k+ 3.8; na++ 136; ca 8.6; gfr >60  09-03-23: protein 6.5 albumin 2.9  TODAY  10-05-23: tsh 1.824; free t3: 2.6 free t4: 0.88 10-12-23: wbc 3.9; hgb 11.8; hct 38.7; mcv 82.3 plt 320 glucose 120; bun 13; creat 0.57; k+ 3.5;  na++ 136; ca 8.8; gfr >60; protein 7.2 albumin 3.2; free t3: 2.8 10-26-23: CEA 2.7    Review of Systems  Constitutional:  Negative for malaise/fatigue.  Respiratory:  Negative for cough and shortness of breath.   Cardiovascular:  Negative for chest pain, palpitations and leg swelling.  Gastrointestinal:  Negative for abdominal pain, constipation and heartburn.  Musculoskeletal:  Negative for back pain, joint pain and myalgias.  Skin: Negative.   Neurological:  Negative for dizziness.  Psychiatric/Behavioral:  The patient is not nervous/anxious.    Physical Exam Constitutional:      General: She is not in acute distress.    Appearance: She is well-developed. She is obese. She is not diaphoretic.  Neck:     Thyroid : No thyromegaly.  Cardiovascular:     Rate and Rhythm: Normal rate and regular rhythm.     Heart sounds: Normal heart sounds.  Pulmonary:     Effort: Pulmonary effort is normal. No respiratory distress.     Breath sounds: Normal breath sounds.  Abdominal:     General: Bowel sounds are normal. There is no distension.     Palpations: Abdomen is soft.     Tenderness: There is no abdominal tenderness.  Musculoskeletal:     Cervical back: Neck supple.     Right lower leg: Edema present.     Left lower leg: Edema present.  Lymphadenopathy:     Cervical: No cervical adenopathy.  Skin:    General: Skin is warm and dry.   Neurological:     Mental Status: She is alert. Mental status is at baseline.  Psychiatric:        Mood and Affect: Mood normal.      ASSESSMENT/ PLAN:  TODAY  Aortic atherosclerosis Mild protein calorie malnutrition  Hypercoagulable  state associated with covid 19  Will continue current medications Will continue current plan of care Will continue to monitor her status.   Time spent with patient 40 minutes: medications; dietary; weight loss; plan of care.    Barnie Seip NP [facility]   call [phone]

## 2024-02-11 ENCOUNTER — Other Ambulatory Visit (HOSPITAL_COMMUNITY)
Admission: RE | Admit: 2024-02-11 | Discharge: 2024-02-11 | Disposition: A | Discharge status: Home / Self Care | Source: Skilled Nursing Facility | Attending: Adult Health | Admitting: Adult Health

## 2024-02-11 DIAGNOSIS — E1122 Type 2 diabetes mellitus with diabetic chronic kidney disease: Secondary | ICD-10-CM | POA: Insufficient documentation

## 2024-02-11 LAB — HEMOGLOBIN A1C
Hgb A1c MFr Bld: 6 % — ABNORMAL HIGH (ref 4.8–5.6)
Mean Plasma Glucose: 125.5 mg/dL

## 2024-02-25 ENCOUNTER — Encounter: Payer: Self-pay | Admitting: Adult Health

## 2024-02-25 ENCOUNTER — Non-Acute Institutional Stay (SKILLED_NURSING_FACILITY): Payer: Self-pay | Admitting: Adult Health

## 2024-02-25 DIAGNOSIS — E1159 Type 2 diabetes mellitus with other circulatory complications: Secondary | ICD-10-CM | POA: Diagnosis not present

## 2024-02-25 DIAGNOSIS — I152 Hypertension secondary to endocrine disorders: Secondary | ICD-10-CM

## 2024-02-25 DIAGNOSIS — E1151 Type 2 diabetes mellitus with diabetic peripheral angiopathy without gangrene: Secondary | ICD-10-CM | POA: Diagnosis not present

## 2024-02-25 DIAGNOSIS — D649 Anemia, unspecified: Secondary | ICD-10-CM

## 2024-02-25 NOTE — Progress Notes (Unsigned)
 Location:  [facility] Room Number: 19 D Place of Service:  SNF (31)   CODE STATUS:DNR/DNH  No Known Allergies  Chief Complaint  Patient presents with   Medical Management of Chronic Issues         Hypertension associated with type 2 diabetes mellitus: Type 2 diabetes mellitus with vascular complications: Chronic anemia    HPI:  She is a 89 y.o. long term resident of this facility being seen for the management of her chronic illnesses: Hypertension associated with type 2 diabetes mellitus: Type 2 diabetes mellitus with vascular complications: Chronic anemia. There are no reports of uncontrolled pain. Her weight is stable over this past month. Her hgb remains stable.    Past Medical History:  Diagnosis Date   Abnormal posture    Per [employer], [facility] Electronic Medical Records System    Anemia    Cognitive communication deficit    Per [employer], [facility] Medical Records [facility]    Contracture of left knee    Per [employer], [facility] Electronic Medical Records System    Difficulty walking    Per [employer], [facility] Medical Records [facility]    Extended spectrum beta lactamase (ESBL) resistance    Per [employer], [facility] Electronic Medical Records System    Gastroesophageal reflux disease    Per [employer], [facility] Electronic Medical Records System    Glaucoma    Gram negative sepsis (HCC)    Per [employer], [facility] Electronic Medical Records System    Hypercholesteremia    Hypertension    Hypokalemia    Infection, bacterial    Per [employer], [facility] Electronic Medical Records System    Iron  deficiency anemia due to chronic blood loss 11/23/2021   Muscle weakness (generalized)    Per [facility], [facility] Electronic Medical Records System    Need for assistance with personal care    Per [employer], [facility] Electronic Medical Records  System    OAB (overactive bladder)    Per [employer], [facility] Electronic Medical Records System    Ocular hypertension, bilateral    Per [employer], [facility] Electronic Medical Records System    Osteoarthritis of right knee    Per [employer], [facility] Electronic Medical Records System    Paraplegia [facility])    Pyelonephritis, acute    Per [employer], [facility] Electronic Medical Records System    Status post total right knee replacement    Unspecified dementia without behavioral disturbance    Per [employer], [facility] Electronic Medical Records System    Urgency of urination    Per [employer], [facility] Electronic Medical Records System    Wheelchair dependence    Per [place], [facility] Electronic Medical Records System     Past Surgical History:  Procedure Laterality Date   ABDOMINAL HYSTERECTOMY     CATARACT EXTRACTION     COLONOSCOPY N/A 12/10/2013   Margo LITTIE Haddock, MD ,Gi revealed redundant colon   TOTAL KNEE ARTHROPLASTY Right 03/30/2016   Procedure: TOTAL KNEE ARTHROPLASTY;  Surgeon: Taft FORBES Minerva, MD;  Location: [facility];  Service: Orthopedics;  Laterality: Right;    Social History   Socioeconomic History   Marital status: Divorced    Spouse name: Not on file   Number of children: Not on file   Years of education: Not on file   Highest education level: Not  on file  Occupational History   Occupation: retired   Tobacco Use   Smoking status: Former    Current packs/day: 0.25    Average packs/day: 0.3 packs/day for 3.0 years (0.8 ttl pk-yrs)    Types: Cigarettes   Smokeless tobacco: Never  Vaping Use   Vaping status: Never Used  Substance and Sexual Activity   Alcohol use: No   Drug use: No   Sexual activity: Not Currently    Birth control/protection: Post-menopausal  Other Topics Concern   Not on file  Social History Narrative   Long term resident of [facility]    Social Drivers of Health    Financial Resource Strain: Not on file  Food Insecurity: Not on file  Transportation Needs: Not on file  Physical Activity: Inactive (02/17/2020)   Exercise Vital Sign    Days of Exercise per Week: 0 days    Minutes of Exercise per Session: 0 min  Stress: Not on file  Social Connections: Not on file  Intimate Partner Violence: Not At Risk (02/17/2020)   Humiliation, Afraid, Rape, and Kick questionnaire    Fear of Current or Ex-Partner: No    Emotionally Abused: No    Physically Abused: No    Sexually Abused: No   Family History  Problem Relation Age of Onset   Cancer Mother    Arthritis Mother    Hypotension Father    Cancer Sister    Hypotension Sister    Colon cancer Neg Hx       VITAL SIGNS BP 122/72   Pulse 74   Temp 98 F (36.7 C)   Resp (!) 22   Ht 5' (1.524 m)   Wt 202 lb 12.8 oz (92 kg)   SpO2 98%   BMI 39.61 kg/m   Outpatient Encounter Medications as of 02/25/2024  Medication Sig   acetaminophen  (TYLENOL ) 650 MG CR tablet Take 650 mg by mouth 2 (two) times daily.   ipratropium-albuterol  (DUONEB) 0.5-2.5 (3) MG/3ML SOLN Take 3 mLs by nebulization every 6 (six) hours.   latanoprost  (XALATAN ) 0.005 % ophthalmic solution Place 1 drop into both eyes at bedtime. wait 5 minutes between multiple eye drops   Nutritional Supplements (ENSURE PO) Take by mouth 2 (two) times daily.   Balsam [place]-Castor Oil (VENELEX) OINT Apply 1 Application topically daily in the afternoon. Apply to open areas on her buttock and upper legs (Patient not taking: Reported on 02/25/2024)   loratadine  (CLARITIN ) 10 MG tablet Take 10 mg by mouth daily. (Patient not taking: Reported on 02/25/2024)   NON FORMULARY Diet: NAS, (Patient not taking: Reported on 02/25/2024)   No facility-administered encounter medications on file as of 02/25/2024.     SIGNIFICANT DIAGNOSTIC EXAMS   03-01-23: glucose 93; bun 13; creat 0.56; k+ 3.6; na++ 138; ca 8.4 gfr >60; protein 6.5 albumin 2.9; hgb A1c 6.1 03-03-23:  wbc 5.2; hgb 11.4; hct 36.7; mcv 84.4 plt 336; glucose 155; bun 13; creat 0.69; k+ 3.9; na++ 134; ca 8.7; gfr >60; protein 6.7 albumin 3.2 d-dimer >20 CRP 14.6   04-18-23: wbc 5.8; hgb 12.0; hct 39.2; mcv 84.5 plt 329; iron  50; tibc 264; ferritin 174 05-31-23: vitamin D  35.45 07-19-23: glucose 105; bun 18; creat 0.72; k+ 3.8; na++ 136; ca 8.6; gfr >60  09-03-23: protein 6.5 albumin 2.9 10-05-23: tsh 1.824; free t3: 2.6 free t4: 0.88 10-12-23: wbc 3.9; hgb 11.8; hct 38.7; mcv 82.3 plt 320 glucose 120; bun 13; creat 0.57; k+ 3.5; na++ 136; ca  8.8; gfr >60; protein 7.2 albumin 3.2; free t3: 2.8 10-26-23: CEA 2.7  TODAY  02-18-24: hgb A1c 6.0      Review of Systems  Constitutional:  Negative for malaise/fatigue.  Respiratory:  Negative for cough and shortness of breath.   Cardiovascular:  Negative for chest pain, palpitations and leg swelling.  Gastrointestinal:  Negative for abdominal pain, constipation and heartburn.  Musculoskeletal:  Negative for back pain, joint pain and myalgias.  Skin: Negative.   Neurological:  Negative for dizziness.  Psychiatric/Behavioral:  The patient is not nervous/anxious.     Physical Exam Constitutional:      General: She is not in acute distress.    Appearance: She is well-developed. She is obese. She is not diaphoretic.  Neck:     Thyroid : No thyromegaly.  Cardiovascular:     Rate and Rhythm: Normal rate and regular rhythm.     Heart sounds: Normal heart sounds.  Pulmonary:     Effort: Pulmonary effort is normal. No respiratory distress.     Breath sounds: Normal breath sounds.  Abdominal:     General: Bowel sounds are normal. There is no distension.     Palpations: Abdomen is soft.     Tenderness: There is no abdominal tenderness.  Musculoskeletal:     Cervical back: Neck supple.     Right lower leg: Edema present.     Left lower leg: Edema present.  Lymphadenopathy:     Cervical: No cervical adenopathy.  Skin:    General: Skin is warm and dry.   Neurological:     Mental Status: She is alert. Mental status is at baseline.  Psychiatric:        Mood and Affect: Mood normal.          ASSESSMENT/ PLAN:   TODAY;    Hypertension associated with type 2 diabetes mellitus: b/p 122/72: is on asa 81 mg daily  2. Type 2 diabetes mellitus with vascular complications: hgb A1c is 6.0   3. Chronic anemia: hgb 11.8; is off iron    PREVIOUS     4. GERD without esophagitis: is off PPI    5. Vitamin D  deficiency: level 35.45; will monitor    6. Urinary urgency: is completely incontinent of bladder is off myrbetriq  7. Vascular dementia without behavioral disturbance: weight is 202 pounds is currently stable.   8. Aortic atherosclerosis (ct 09-03-19)   9. Hypokalemia: k+ 3.8; will monitor  10. Increased intraocular present bilateral: will continue xalatan  to both eyes  11. Weight loss/failure to thrive in adult: her current weight is 202 pounds; her weight this past month is stable.    12. Primary osteoarthritis right knee: will continue tylenol  650 mg twice daily   13. Unspecified protein calorie malnutrition: albumin 3.2 will monitor  14. Allergic rhinitis: will continue claritin  10 mg daily     Barnie Seip NP [facility]   call [phone]

## 2024-03-31 ENCOUNTER — Encounter: Payer: Self-pay | Admitting: Internal Medicine

## 2024-03-31 ENCOUNTER — Non-Acute Institutional Stay (SKILLED_NURSING_FACILITY): Payer: Self-pay | Admitting: Internal Medicine

## 2024-03-31 DIAGNOSIS — R635 Abnormal weight gain: Secondary | ICD-10-CM | POA: Diagnosis not present

## 2024-03-31 DIAGNOSIS — J209 Acute bronchitis, unspecified: Secondary | ICD-10-CM

## 2024-03-31 DIAGNOSIS — F01C0 Vascular dementia, severe, without behavioral disturbance, psychotic disturbance, mood disturbance, and anxiety: Secondary | ICD-10-CM | POA: Diagnosis not present

## 2024-03-31 DIAGNOSIS — E1151 Type 2 diabetes mellitus with diabetic peripheral angiopathy without gangrene: Secondary | ICD-10-CM | POA: Diagnosis not present

## 2024-03-31 NOTE — Patient Instructions (Signed)
 See assessment and plan under each diagnosis in the problem list and acutely for this visit

## 2024-03-31 NOTE — Assessment & Plan Note (Signed)
 02/11/2024 A1c 6% responded to the mean glucose 126.  No change indicated.  Monitor A1c in 4-6 months.

## 2024-03-31 NOTE — Progress Notes (Unsigned)
 NURSING HOME LOCATION:  [facility] ROOM NUMBER:  105 D  CODE STATUS:  DNR  PCP: Landy Barnie RAMAN, NP   This is a nursing facility follow up visit of chronic medical diagnoses to document compliance with Regulation 483.30 (c) in The Long Term Care Survey Manual Phase 2 which mandates caregiver visit ( visits can alternate among physician, PA or NP as per statutes) within 10 days of 30 days / 60 days/ 90 days post admission to SNF date  .  Interim medical record and care since last SNF visit was updated with review of diagnostic studies and change in clinical status since last visit were documented.  HPI: She is a permanent resident at this facility with medical diagnoses of GERD, glaucoma, dyslipidemia, essential hypertension, chronic blood loss iron  deficiency anemia, OAB, and neurocognitive deficits.  A1c was updated on 02/11/2024 and revealed a value of 6% which corresponds to a mean plasma glucose of 126.  Serially albumin has increased to 3.2 from 2.9; and total protein from 6.5 up to 7.2.  There is been minimal progression in her normocytic, slightly hypochromic anemia with most recent values of 11.8/38.7.  Review of systems: Dementia invalidated responses.  As always , her major concern is when can I go home?  She does state that she has had some cough which is mainly nonproductive for 2 days.  She has also had some watery eyes as well as possibly some wheezing.  The symptoms were preceded by sore throat which has resolved.  She has a history of allergic rhinitis.  There is no historical diagnoses of asthma or COPD.  She has DuoNebs ordered as well as as loratadine .  Her other concern is that she does not like the Ensure supplement as it has caused her to gain 4 to 5 pounds.   Constitutional: No fever, fatigue  Eyes: No redness, discharge, pain, vision change ENT/mouth: No nasal congestion,  purulent discharge, earache, change in hearing Cardiovascular: No chest  pain, palpitations, paroxysmal nocturnal dyspnea, claudication, edema  Respiratory: No hemoptysis, DOE, significant snoring, apnea   Gastrointestinal: No heartburn, dysphagia, abdominal pain, nausea /vomiting, rectal bleeding, melena, change in bowels Genitourinary: No dysuria, hematuria, pyuria, incontinence, nocturia Musculoskeletal: No joint stiffness, joint swelling, weakness, pain Dermatologic: No rash, pruritus, change in appearance of skin Neurologic: No dizziness, headache, syncope, seizures, numbness, tingling Psychiatric: No significant anxiety, depression, insomnia, anorexia Endocrine: No change in hair/skin/nails, excessive thirst, excessive hunger, excessive urination  Hematologic/lymphatic: No significant bruising, lymphadenopathy, abnormal bleeding Allergy/immunology: No significant sneezing, urticaria, angioedema  Physical exam:  Pertinent or positive findings: She was in the wheelchair in the hallway; she was able to identify her room number.  As I was rolling her to her room for the interview and exam; she did exhibit a brief, nonproductive cough.  She exhibits a hyponasal speech pattern which is somewhat difficult to discern.  There is marked decrease in density of the eyebrows.  She is completely edentulous.  Slight tachycardia is noted.  She has minimal low-grade rales at the bases.  Abdomen is protuberant.  Pedal pulses are decreased.  There is a flexion contracture of the right knee.  She has scattered small punctate vitiliginous lesions over the upper extremities.  General appearance: Adequately nourished; no acute distress, increased work of breathing is present.   Lymphatic: No lymphadenopathy about the head, neck, axilla. Eyes: No conjunctival inflammation or lid edema is present. There is no scleral icterus. Ears:  External ear exam  shows no significant lesions or deformities.   Nose:  External nasal examination shows no deformity or inflammation. Nasal mucosa are pink  and moist without lesions, exudates Neck:  No thyromegaly, masses, tenderness noted.    Heart:  No murmur, click, rub .  Lungs: without wheezes, rhonchi, rubs. Abdomen: Bowel sounds are normal. Abdomen is soft and nontender with no organomegaly, hernias, masses. GU: Deferred  Extremities:  No cyanosis, clubbing, edema  Neurologic exam :Balance, Rhomberg, finger to nose testing could not be completed due to clinical state Deep tendon reflexes are equal Skin: Warm & dry w/o tenting. No significant rash.  See summary under each active problem in the Problem List with associated updated therapeutic plan:  DM (diabetes mellitus), type 2 with peripheral vascular complications (HCC) 02/11/2024 A1c 6% corresponding to the mean glucose 126.  No change indicated.  Monitor A1c in 4-6 months.  Excessive weight gain She is concerned about weight gain of 4-5 pounds which she relates to Ensure supplementation.  TSH was therapeutic 6 months ago.  Nutritionist to assess  Vascular dementia [facility]) She is pleasantly confused without evidence of behavioral issues.  Continue to monitor; no intervention indicated at this time.  Her major complaint son has when I go home?

## 2024-03-31 NOTE — Assessment & Plan Note (Signed)
 She is concerned about weight gain of 4-5 pounds which she relates to Ensure supplementation.  TSH was therapeutic 6 months ago.  Nutritionist to assess

## 2024-04-01 NOTE — Assessment & Plan Note (Signed)
 She is pleasantly confused without evidence of behavioral issues.  Continue to monitor; no intervention indicated at this time.  Her major complaint son has when I go home?

## 2024-04-18 ENCOUNTER — Encounter: Payer: Self-pay | Admitting: Adult Health

## 2024-04-18 ENCOUNTER — Non-Acute Institutional Stay (SKILLED_NURSING_FACILITY): Payer: Self-pay | Admitting: Adult Health

## 2024-04-18 DIAGNOSIS — U071 COVID-19: Secondary | ICD-10-CM

## 2024-04-18 DIAGNOSIS — E1159 Type 2 diabetes mellitus with other circulatory complications: Secondary | ICD-10-CM | POA: Diagnosis not present

## 2024-04-18 DIAGNOSIS — G8220 Paraplegia, unspecified: Secondary | ICD-10-CM

## 2024-04-18 DIAGNOSIS — I152 Hypertension secondary to endocrine disorders: Secondary | ICD-10-CM

## 2024-04-18 DIAGNOSIS — D6869 Other thrombophilia: Secondary | ICD-10-CM

## 2024-04-18 NOTE — Progress Notes (Signed)
 Location:  [facility] Room Number: 105-D Place of Service:  SNF (31)   CODE STATUS: DNR  No Known Allergies  Chief Complaint  Patient presents with   Care Plan Meeting     HPI:  We have come together for her care plan meeting. BIMS 10/14 mood 3/30: decreased energy. Using wheelchair without falls. She requires dependent assist with her adl care. She is incontinent of bladder and bowel. Dietary: feeds self, regular diet appetite 26-100% weight is 204.4 pounds. Therapy: none at this time. Activities: does participate. She will continue to be followed for her chronic illnesses including:  Hypertension associated with type 2 diabetes mellitus   Hypercoagulable state associated with covid 19   Paraplegia unspecified  Past Medical History:  Diagnosis Date   Abnormal posture    Per [facility], [facility] Electronic Medical Records System    Anemia    Cognitive communication deficit    Per [facility], [facility] Records [facility]    Contracture of left knee    Per [facility], [facility] Records [facility]    Difficulty walking    Per [facility], [facility] Records [facility]    Extended spectrum beta lactamase (ESBL) resistance    Per [facility], [facility] Records [facility]    Gastroesophageal reflux disease    Per [facility], [facility] Records [facility]    Glaucoma    Gram negative sepsis (HCC)    Per [facility], [facility] Electronic [facility] Records [facility]    Hypercholesteremia    Hypertension    Hypokalemia    Infection, bacterial    Per [facility], [facility] Electronic Medical Records [facility]    Iron  deficiency anemia due to chronic blood loss 11/23/2021   Muscle weakness (generalized)    Per [facility], [facility] Records [facility]    Need for assistance with personal care    Per [facility], [facility]    OAB (overactive bladder)    Per [facility], [facility] Records [facility]    Ocular hypertension, bilateral    Per [facility], [facility] Records [facility]    Osteoarthritis of right knee    Per [facility], [facility] Electronic [facility] Records [facility]    Paraplegia [facility])    Pyelonephritis, acute    Per [facility], [facility] Electronic Medical Records System    Status post total right knee replacement    Unspecified dementia without behavioral disturbance    Per [facility], [facility] Electronic Medical Records [facility]    Urgency of urination    Per [facility], [facility] Records [facility]    Wheelchair dependence    Per [place], [facility] Electronic [facility] Records System     Past Surgical History:  Procedure Laterality Date   ABDOMINAL HYSTERECTOMY     CATARACT EXTRACTION     COLONOSCOPY N/A 12/10/2013   Margo LITTIE Haddock, MD ,Gi revealed redundant colon   TOTAL KNEE ARTHROPLASTY Right 03/30/2016   Procedure: TOTAL KNEE ARTHROPLASTY;  Surgeon: Taft FORBES Minerva, MD;  Location: [facility];  Service: Orthopedics;  Laterality: Right;    Social History   Socioeconomic History   Marital status: Divorced    Spouse name: Not on file   Number of children: Not on file   Years of education: Not on file   Highest education  level: Not on file  Occupational History   Occupation: retired   Tobacco Use   Smoking status: Former    Current packs/day: 0.25    Average packs/day: 0.3 packs/day for 3.0 years (0.8 ttl pk-yrs)    Types: Cigarettes   Smokeless tobacco: Never  Vaping Use   Vaping status: Never Used  Substance and Sexual Activity   Alcohol use: No   Drug use: No   Sexual activity: Not Currently    Birth control/protection: Post-menopausal  Other Topics Concern   Not on file  Social History Narrative   Long term resident of  [facility]    Social Drivers of Health   Financial Resource Strain: Not on file  Food Insecurity: Not on file  Transportation Needs: Not on file  Physical Activity: Inactive (02/17/2020)   Exercise Vital Sign    Days of Exercise per Week: 0 days    Minutes of Exercise per Session: 0 min  Stress: Not on file  Social Connections: Not on file  Intimate Partner Violence: Not At Risk (02/17/2020)   Humiliation, Afraid, Rape, and Kick questionnaire    Fear of Current or Ex-Partner: No    Emotionally Abused: No    Physically Abused: No    Sexually Abused: No   Family History  Problem Relation Age of Onset   Cancer Mother    Arthritis Mother    Hypotension Father    Cancer Sister    Hypotension Sister    Colon cancer Neg Hx       VITAL SIGNS BP 120/80   Pulse 90   Ht 5' (1.524 m)   Wt 202 lb 6.4 oz (91.8 kg)   SpO2 90%   BMI 39.53 kg/m   Outpatient Encounter Medications as of 04/18/2024  Medication Sig   acetaminophen  (TYLENOL ) 650 MG CR tablet Take 650 mg by mouth 2 (two) times daily.   camphor-menthol  (SARNA) lotion Apply 1 Application topically 2 (two) times daily as needed for itching.   guaifenesin (ROBITUSSIN) 100 MG/5ML syrup Take 300 mg by mouth every 6 (six) hours as needed for cough.   ipratropium-albuterol  (DUONEB) 0.5-2.5 (3) MG/3ML SOLN Take 3 mLs by nebulization every 6 (six) hours.   latanoprost  (XALATAN ) 0.005 % ophthalmic solution Place 1 drop into both eyes at bedtime. wait 5 minutes between multiple eye drops   Nutritional Supplements (ENSURE PO) Take by mouth 2 (two) times daily.   Balsam [place]-Castor Oil (VENELEX) OINT Apply 1 Application topically daily in the afternoon. Apply to open areas on her buttock and upper legs (Patient not taking: Reported on 04/18/2024)   loratadine  (CLARITIN ) 10 MG tablet Take 10 mg by mouth daily. (Patient not taking: Reported on 04/18/2024)   NON FORMULARY Diet: NAS, (Patient not taking: Reported on 04/18/2024)   No  facility-administered encounter medications on file as of 04/18/2024.     SIGNIFICANT DIAGNOSTIC EXAMS   04-18-23: wbc 5.8; hgb 12.0; hct 39.2; mcv 84.5 plt 329; iron  50; tibc 264; ferritin 174 05-31-23: vitamin D  35.45 07-19-23: glucose 105; bun 18; creat 0.72; k+ 3.8; na++ 136; ca 8.6; gfr >60  09-03-23: protein 6.5 albumin 2.9 10-05-23: tsh 1.824; free t3: 2.6 free t4: 0.88 10-12-23: wbc 3.9; hgb 11.8; hct 38.7; mcv 82.3 plt 320 glucose 120; bun 13; creat 0.57; k+ 3.5; na++ 136; ca 8.8; gfr >60; protein 7.2 albumin 3.2; free t3: 2.8 10-26-23: CEA 2.7  TODAY  02-18-24: hgb A1c 6.0     Review of Systems  Constitutional:  Negative for malaise/fatigue.  Respiratory:  Negative for cough and shortness of breath.   Cardiovascular:  Negative for chest pain, palpitations and leg swelling.  Gastrointestinal:  Negative for abdominal pain, constipation and heartburn.  Musculoskeletal:  Negative for back pain, joint pain and myalgias.  Skin: Negative.   Neurological:  Negative for dizziness.  Psychiatric/Behavioral:  The patient is not nervous/anxious.     Physical Exam Constitutional:      General: She is not in acute distress.    Appearance: She is well-developed. She is obese. She is not diaphoretic.  Neck:     Thyroid : No thyromegaly.  Cardiovascular:     Rate and Rhythm: Normal rate and regular rhythm.     Heart sounds: Normal heart sounds.  Pulmonary:     Effort: Pulmonary effort is normal. No respiratory distress.     Breath sounds: Normal breath sounds.  Abdominal:     General: Bowel sounds are normal. There is no distension.     Palpations: Abdomen is soft.     Tenderness: There is no abdominal tenderness.  Musculoskeletal:     Cervical back: Neck supple.     Right lower leg: Edema present.     Left lower leg: Edema present.  Lymphadenopathy:     Cervical: No cervical adenopathy.  Skin:    General: Skin is warm and dry.  Neurological:     Mental Status: She is alert.  Mental status is at baseline.  Psychiatric:        Mood and Affect: Mood normal.     ASSESSMENT/ PLAN:  TODAY  Hypertension associated with type 2 diabetes mellitus  Hypercoagulable state associated with covid 19 Paraplegia unspecified  Will continue current medications Will continue current plan of care Will continue to monitor her status  Time spent with patient: 40 minutes: medications; plan of care; dietary.    Barnie Seip NP [facility]   call [phone]

## 2024-04-23 ENCOUNTER — Inpatient Hospital Stay: Payer: Medicare HMO | Attending: Physician Assistant

## 2024-04-23 ENCOUNTER — Non-Acute Institutional Stay (SKILLED_NURSING_FACILITY): Admitting: Adult Health

## 2024-04-23 ENCOUNTER — Encounter: Payer: Self-pay | Admitting: Adult Health

## 2024-04-23 DIAGNOSIS — R791 Abnormal coagulation profile: Secondary | ICD-10-CM | POA: Insufficient documentation

## 2024-04-23 DIAGNOSIS — D509 Iron deficiency anemia, unspecified: Secondary | ICD-10-CM | POA: Insufficient documentation

## 2024-04-23 DIAGNOSIS — Z0000 Encounter for general adult medical examination without abnormal findings: Secondary | ICD-10-CM | POA: Diagnosis not present

## 2024-04-23 DIAGNOSIS — Z87891 Personal history of nicotine dependence: Secondary | ICD-10-CM | POA: Insufficient documentation

## 2024-04-23 DIAGNOSIS — K921 Melena: Secondary | ICD-10-CM | POA: Diagnosis not present

## 2024-04-23 DIAGNOSIS — Z809 Family history of malignant neoplasm, unspecified: Secondary | ICD-10-CM | POA: Diagnosis not present

## 2024-04-23 DIAGNOSIS — R7989 Other specified abnormal findings of blood chemistry: Secondary | ICD-10-CM | POA: Diagnosis not present

## 2024-04-23 DIAGNOSIS — Z993 Dependence on wheelchair: Secondary | ICD-10-CM | POA: Diagnosis not present

## 2024-04-23 DIAGNOSIS — D500 Iron deficiency anemia secondary to blood loss (chronic): Secondary | ICD-10-CM

## 2024-04-23 LAB — FERRITIN: Ferritin: 300 ng/mL (ref 11–307)

## 2024-04-23 LAB — CBC WITH DIFFERENTIAL/PLATELET
Abs Immature Granulocytes: 0.02 K/uL (ref 0.00–0.07)
Basophils Absolute: 0 K/uL (ref 0.0–0.1)
Basophils Relative: 1 %
Eosinophils Absolute: 0.2 K/uL (ref 0.0–0.5)
Eosinophils Relative: 4 %
HCT: 44 % (ref 36.0–46.0)
Hemoglobin: 14 g/dL (ref 12.0–15.0)
Immature Granulocytes: 0 %
Lymphocytes Relative: 28 %
Lymphs Abs: 1.5 K/uL (ref 0.7–4.0)
MCH: 27.3 pg (ref 26.0–34.0)
MCHC: 31.8 g/dL (ref 30.0–36.0)
MCV: 85.9 fL (ref 80.0–100.0)
Monocytes Absolute: 0.5 K/uL (ref 0.1–1.0)
Monocytes Relative: 10 %
Neutro Abs: 3.1 K/uL (ref 1.7–7.7)
Neutrophils Relative %: 57 %
Platelets: UNDETERMINED K/uL (ref 150–400)
RBC: 5.12 MIL/uL — ABNORMAL HIGH (ref 3.87–5.11)
RDW: 15.1 % (ref 11.5–15.5)
WBC: 5.3 K/uL (ref 4.0–10.5)
nRBC: 0 % (ref 0.0–0.2)

## 2024-04-23 LAB — IRON AND TIBC
Iron: 30 ug/dL (ref 28–170)
Saturation Ratios: 12 % (ref 10.4–31.8)
TIBC: 253 ug/dL (ref 250–450)
UIBC: 223 ug/dL

## 2024-04-23 NOTE — Progress Notes (Signed)
 Subjective:   Anna Hanna is a 89 y.o. female who presents for Medicare Annual (Subsequent) preventive examination.  Visit Complete: In person  Patient Medicare AWV questionnaire was completed by the patient on 04-23-2024; I have confirmed that all information answered by patient is correct and no changes since this date.  Cardiac Risk Factors include: advanced age (>39men, >37 women);diabetes mellitus;dyslipidemia;hypertension;obesity (BMI >30kg/m2);sedentary lifestyle     Objective:    Today's Vitals   04/23/24 1145  BP: 122/82  Pulse: 74  Resp: 20  Temp: (!) 97.3 F (36.3 C)  SpO2: 97%  Weight: 202 lb 6.4 oz (91.8 kg)  Height: 5' (1.524 m)   Body mass index is 39.53 kg/m.     04/18/2024   10:13 AM 01/24/2024   11:46 AM 01/23/2024   10:59 AM 11/26/2023   10:11 AM 05/11/2023    9:14 AM 04/25/2023    1:02 PM 02/27/2023   11:36 AM  Advanced Directives  Does Patient Have a Medical Advance Directive? Yes Yes Yes Yes No No No  Type of Advance Directive Out of facility DNR (pink MOST or yellow form) Out of facility DNR (pink MOST or yellow form) Out of facility DNR (pink MOST or yellow form) Out of facility DNR (pink MOST or yellow form)     Does patient want to make changes to medical advance directive? No - Patient declined No - Patient declined No - Patient declined No - Patient declined     Would patient like information on creating a medical advance directive?     No - Patient declined No - Patient declined   Pre-existing out of facility DNR order (yellow form or pink MOST form) Yellow form placed in chart (order not valid for inpatient use);Pink MOST form placed in chart (order not valid for inpatient use) Yellow form placed in chart (order not valid for inpatient use);Pink MOST form placed in chart (order not valid for inpatient use) Yellow form placed in chart (order not valid for inpatient use);Pink MOST form placed in chart (order not valid for inpatient use) Yellow form  placed in chart (order not valid for inpatient use);Pink MOST form placed in chart (order not valid for inpatient use)       Current Medications (verified) Outpatient Encounter Medications as of 04/23/2024  Medication Sig   acetaminophen  (TYLENOL ) 650 MG CR tablet Take 650 mg by mouth 2 (two) times daily.   Balsam [place]-Castor Oil (VENELEX) OINT Apply 1 Application topically daily in the afternoon. Apply to open areas on her buttock and upper legs (Patient not taking: Reported on 04/18/2024)   camphor-menthol  (SARNA) lotion Apply 1 Application topically 2 (two) times daily as needed for itching.   guaifenesin (ROBITUSSIN) 100 MG/5ML syrup Take 300 mg by mouth every 6 (six) hours as needed for cough.   ipratropium-albuterol  (DUONEB) 0.5-2.5 (3) MG/3ML SOLN Take 3 mLs by nebulization every 6 (six) hours.   latanoprost  (XALATAN ) 0.005 % ophthalmic solution Place 1 drop into both eyes at bedtime. wait 5 minutes between multiple eye drops   loratadine  (CLARITIN ) 10 MG tablet Take 10 mg by mouth daily. (Patient not taking: Reported on 04/18/2024)   NON FORMULARY Diet: NAS, (Patient not taking: Reported on 04/18/2024)   Nutritional Supplements (ENSURE PO) Take by mouth 2 (two) times daily.   No facility-administered encounter medications on file as of 04/23/2024.    Allergies (verified) Patient has no known allergies.   History: Past Medical History:  Diagnosis Date  Abnormal posture    Per [facility], [facility] Electronic Medical Records System    Anemia    Cognitive communication deficit    Per [facility], [facility] Records [facility]    Contracture of left knee    Per [facility], [facility] Records [facility]    Difficulty walking    Per [facility], [facility] Records [facility]    Extended spectrum beta lactamase (ESBL) resistance    Per [facility], [facility] Records [facility]     Gastroesophageal reflux disease    Per [facility], [facility] Electronic Medical Records [facility]    Glaucoma    Gram negative sepsis (HCC)    Per [facility], [facility] Electronic Medical Records [facility]    Hypercholesteremia    Hypertension    Hypokalemia    Infection, bacterial    Per [facility], [facility] Electronic Medical Records [facility]    Iron  deficiency anemia due to chronic blood loss 11/23/2021   Muscle weakness (generalized)    Per [facility], [facility] Records [facility]    Need for assistance with personal care    Per [facility], [facility] Records [facility]    OAB (overactive bladder)    Per [facility], [facility] Records [facility]    Ocular hypertension, bilateral    Per [facility], [facility] Records [facility]    Osteoarthritis of right knee    Per [facility], [facility] Medical Records [facility]    Paraplegia [facility])    Pyelonephritis, acute    Per [facility], [facility] Electronic Medical Records System    Status post total right knee replacement    Unspecified dementia without behavioral disturbance    Per [facility], [facility] Electronic Medical Records System    Urgency of urination    Per [facility], [facility] Records [facility]    Wheelchair dependence    Per [place], [facility] Electronic Medical Records System    Past Surgical History:  Procedure Laterality Date   ABDOMINAL HYSTERECTOMY     CATARACT EXTRACTION     COLONOSCOPY N/A 12/10/2013   Margo LITTIE Haddock, MD ,Gi revealed redundant colon   TOTAL KNEE ARTHROPLASTY Right 03/30/2016   Procedure: TOTAL KNEE ARTHROPLASTY;  Surgeon: Taft FORBES Minerva, MD;  Location: [facility];  Service: Orthopedics;  Laterality: Right;   Family History  Problem Relation Age of Onset   Cancer Mother    Arthritis Mother    Hypotension Father    Cancer  Sister    Hypotension Sister    Colon cancer Neg Hx    Social History   Socioeconomic History   Marital status: Divorced    Spouse name: Not on file   Number of children: Not on file   Years of education: Not on file   Highest education level: Not on file  Occupational History   Occupation: retired   Tobacco Use   Smoking status: Former    Current packs/day: 0.25    Average packs/day: 0.3 packs/day for 3.0 years (0.8 ttl pk-yrs)    Types: Cigarettes   Smokeless tobacco: Never  Vaping Use   Vaping status: Never Used  Substance and Sexual Activity   Alcohol use: No   Drug use: No   Sexual activity: Not Currently    Birth control/protection: Post-menopausal  Other Topics Concern   Not on file  Social History Narrative   Long term resident of [facility]    Social Drivers of Health   Financial Resource Strain: Not on file  Food Insecurity: Not on file  Transportation Needs: Not on file  Physical Activity: Inactive (02/17/2020)   Exercise Vital Sign    Days of Exercise per Week: 0 days    Minutes of Exercise per Session: 0 min  Stress: Not on file  Social Connections: Not on file    Tobacco Counseling Counseling given: Not Answered   Clinical Intake:  Pre-visit preparation completed: Yes  Pain : No/denies pain     BMI - recorded: 39.53 Nutritional Status: BMI > 30  Obese Nutritional Risks: Unintentional weight loss Diabetes: Yes CBG done?: Yes CBG resulted in Enter/ Edit results?: Yes Did pt. bring in CBG monitor from home?: No  How often do you need to have someone help you when you read instructions, pamphlets, or other written materials from your doctor or pharmacy?: 5 - Always  Interpreter Needed?: No  Comments: long term resident of [facility]   Activities of Daily Living    04/23/2024   11:49 AM  In your present state of health, do you have any difficulty performing the following activities:  Hearing? 0  Vision? 0  Difficulty concentrating or making  decisions? 1  Walking or climbing stairs? 1  Dressing or bathing? 1  Doing errands, shopping? 1  Preparing Food and eating ? Y  Using the Toilet? Y  In the past six months, have you accidently leaked urine? Y  Do you have problems with loss of bowel control? Y  Managing your Medications? Y  Managing your Finances? Y  Housekeeping or managing your Housekeeping? Y    Patient Care Team: Landy Barnie RAMAN, NP as PCP - General (Geriatric Medicine) [facility] Cindie Carlin POUR, DO as Consulting Physician (Gastroenterology)  Indicate any recent Medical Services you may have received from other than [facility] providers in the past year (date may be approximate).     Assessment:   This is a routine wellness examination for Louna.  Hearing/Vision screen No results found.   Goals Addressed             This Visit's Progress    DIET - INCREASE WATER  INTAKE   On track    Follow up with Provider as scheduled   On track    General - Client will not be readmitted within 30 days ([facility])   On track      Depression Screen    04/23/2024   11:51 AM 08/01/2023    1:02 PM 03/19/2023    2:00 PM 02/27/2023   11:36 AM 08/30/2022   12:34 PM 03/15/2022    1:56 PM 08/26/2021   11:48 AM  PHQ 2/9 Scores  PHQ - 2 Score 0 0 0 0 0 0 0  PHQ- 9 Score  0   0 0 0    Fall Risk    04/23/2024   11:50 AM 08/01/2023    1:02 PM 03/19/2023    1:54 PM 02/27/2023   11:36 AM 01/10/2023   12:11 PM  Fall Risk   Falls in the past year? 0 0 1 0 0  Number falls in past yr: 0 0 0 0 0  Injury with Fall? 0 0 0 0 0  Risk for fall due to : Impaired balance/gait;Impaired mobility Impaired balance/gait;Impaired mobility History of fall(s);Impaired balance/gait;Impaired mobility No Fall Risks Impaired balance/gait;Impaired mobility  Follow  up   Falls evaluation completed Falls evaluation completed Falls evaluation completed    MEDICARE RISK AT HOME: Medicare Risk at Home Any stairs in or  around the home?: Yes If so, are there any without handrails?: No Home free of loose throw rugs in walkways, pet beds, electrical cords, etc?: Yes Adequate lighting in your home to reduce risk of falls?: Yes Life alert?: No Use of a cane, walker or w/c?: Yes Grab bars in the bathroom?: Yes Shower chair or bench in shower?: Yes Elevated toilet seat or a handicapped toilet?: Yes  TIMED UP AND GO:  Was the test performed?  No    Cognitive Function:    03/19/2023    2:00 PM 03/15/2022    1:57 PM 03/14/2021    3:26 PM  MMSE - Mini Mental State Exam  Not completed: Unable to complete    Orientation to time  3 4  Orientation to Place  3 3  Registration  3 3  Attention/ Calculation  2 2  Recall  1 1  Language- name 2 objects  2 2  Language- repeat  1 1  Language- follow 3 step command  3 3  Language- read & follow direction  1 1  Write a sentence  0 0  Copy design  0 0  Total score  19 20        03/15/2022    1:58 PM 03/14/2021    3:27 PM 02/17/2020   10:48 AM  6CIT Screen  What Year? 4 points 4 points 4 points  What month? 3 points 3 points   What time? 3 points 3 points   Count back from 20 4 points 4 points   Months in reverse 4 points 4 points   Repeat phrase 4 points 6 points   Total Score 22 points 24 points     Immunizations Immunization History  Administered Date(s) Administered    sv, Bivalent, Protein Subunit Rsvpref,pf (Abrysvo) 06/26/2022   Fluad Quad(high Dose 65+) 04/27/2021, 05/01/2023   Influenza-Unspecified 04/30/2020, 04/25/2022, 05/14/2023   Moderna Covid-19 Fall Seasonal Vaccine 45yrs & older 10/23/2023   Moderna Covid-19 Vaccine Bivalent Booster 64yrs & up 05/17/2021, 12/13/2021, 05/01/2023   Moderna SARS-COV2 Booster Vaccination 11/03/2020   Moderna Sars-Covid-2 Vaccination 08/03/2019, 08/31/2019, 05/27/2020, 12/13/2021, 05/17/2022   PNEUMOCOCCAL CONJUGATE-20 04/29/2021   PPD Test 10/03/2016   Pneumococcal Conjugate-13 04/20/2016   Tdap  05/25/2021    TDAP status: Up to date  Flu Vaccine status: Up to date  Pneumococcal vaccine status: Up to date  Covid-19 vaccine status: Completed vaccines  Qualifies for Shingles Vaccine? No   Zostavax completed Yes     Screening Tests Health Maintenance  Topic Date Due   Influenza Vaccine  02/22/2024   Medicare Annual Wellness (AWV)  03/18/2024   COVID-19 Vaccine (11 - 2024-25 season) 04/23/2024   FOOT EXAM  07/31/2024   HEMOGLOBIN A1C  08/13/2024   OPHTHALMOLOGY EXAM  01/08/2025   DTaP/Tdap/Td (2 - Td or Tdap) 05/26/2031   Pneumococcal Vaccine: 50+ Years  Completed   DEXA SCAN  Completed   HPV VACCINES  Aged Out   Meningococcal B Vaccine  Aged Out   Zoster Vaccines- Shingrix  Discontinued    Health Maintenance  Health Maintenance Due  Topic Date Due   Influenza Vaccine  02/22/2024   Medicare Annual Wellness (AWV)  03/18/2024   COVID-19 Vaccine (11 - 2024-25 season) 04/23/2024    Colorectal cancer screening: No longer required.   Mammogram status: No  longer required due to age.  Bone Density status: Completed 2023. Results reflect: Bone density results: OSTEOPOROSIS. Repeat every   years.  Lung Cancer Screening: (Low Dose CT Chest recommended if Age 66-80 years, 20 pack-year currently smoking OR have quit w/in 15years.) does not qualify.   Lung Cancer Screening Referral:   Additional Screening:  Hepatitis C Screening: does not qualify; Completed   Vision Screening: Recommended annual ophthalmology exams for early detection of glaucoma and other disorders of the eye. Is the patient up to date with their annual eye exam?  No  Who is the provider or what is the name of the office in which the patient attends annual eye exams?  If pt is not established with a provider, would they like to be referred to a provider to establish care? No .   Dental Screening: Recommended annual dental exams for proper oral hygiene  Diabetic Foot Exam: Diabetic Foot Exam:  Completed    Community Resource Referral / Chronic Care Management: CRR required this visit?  No   CCM required this visit?  No     Plan:     I have personally reviewed and noted the following in the patient's chart:   Medical and social history Use of alcohol, tobacco or illicit drugs  Current medications and supplements including opioid prescriptions. Patient is not currently taking opioid prescriptions. Functional ability and status Nutritional status Physical activity Advanced directives List of other physicians Hospitalizations, surgeries, and ER visits in previous 12 months Vitals Screenings to include cognitive, depression, and falls Referrals and appointments  In addition, I have reviewed and discussed with patient certain preventive protocols, quality metrics, and best practice recommendations. A written personalized care plan for preventive services as well as general preventive health recommendations were provided to patient.     Barnie GORMAN Seip, NP   04/23/2024   After Visit Summary: (In Person-Declined) Patient declined AVS at this time.  Nurse Notes: this exam was performed by myself at this facility

## 2024-04-28 DIAGNOSIS — L602 Onychogryphosis: Secondary | ICD-10-CM | POA: Diagnosis not present

## 2024-04-28 DIAGNOSIS — R600 Localized edema: Secondary | ICD-10-CM | POA: Diagnosis not present

## 2024-04-28 DIAGNOSIS — M6281 Muscle weakness (generalized): Secondary | ICD-10-CM | POA: Diagnosis not present

## 2024-04-28 DIAGNOSIS — M25571 Pain in right ankle and joints of right foot: Secondary | ICD-10-CM | POA: Diagnosis not present

## 2024-04-28 DIAGNOSIS — L603 Nail dystrophy: Secondary | ICD-10-CM | POA: Diagnosis not present

## 2024-04-28 DIAGNOSIS — I739 Peripheral vascular disease, unspecified: Secondary | ICD-10-CM | POA: Diagnosis not present

## 2024-04-29 ENCOUNTER — Encounter: Payer: Self-pay | Admitting: Adult Health

## 2024-04-29 ENCOUNTER — Non-Acute Institutional Stay (SKILLED_NURSING_FACILITY): Payer: Self-pay | Admitting: Adult Health

## 2024-04-29 DIAGNOSIS — N3281 Overactive bladder: Secondary | ICD-10-CM | POA: Diagnosis not present

## 2024-04-29 DIAGNOSIS — K219 Gastro-esophageal reflux disease without esophagitis: Secondary | ICD-10-CM

## 2024-04-29 DIAGNOSIS — E559 Vitamin D deficiency, unspecified: Secondary | ICD-10-CM

## 2024-04-29 NOTE — Progress Notes (Signed)
 Location:  [facility] Room Number: [id number] Place of Service:  SNF (31)   CODE STATUS: dnr   No Known Allergies  Chief Complaint  Patient presents with   Medical Management of Chronic Issues                    GERD without esophagitis:     Vitamin D  deficiency:    Urinary  urgency:    HPI:  She is a 89 y.o. long term resident of this facility being seen for the management of her chronic illnesses:GERD without esophagitis:     Vitamin D  deficiency:    Urinary  urgency. There are no reports of uncontrolled pain. Her weight remains stable; she continues to get out of bed daily.    Past Medical History:  Diagnosis Date   Abnormal posture    Per [facility], [facility] Records [facility]    Anemia    Cognitive communication deficit    Per [facility], [facility] Records [facility]    Contracture of left knee    Per [facility], [facility] Records [facility]    Difficulty walking    Per [facility], [facility]    Extended spectrum beta lactamase (ESBL) resistance    Per [facility], [facility] Records [facility]    Gastroesophageal reflux disease    Per [facility], [facility] Records [facility]    Glaucoma    Gram negative sepsis (HCC)    Per [facility], [facility] Records [facility]    Hypercholesteremia    Hypertension    Hypokalemia    Infection, bacterial    Per [facility], [facility] Records [facility]    Iron  deficiency anemia due to chronic blood loss 11/23/2021   Muscle weakness (generalized)    Per [facility], [facility] Records [facility]    Need for assistance with personal care    Per [facility], [facility] Records [facility]    OAB (overactive bladder)    Per [facility], [facility] Electronic Medical  Records [facility]    Ocular hypertension, bilateral    Per [facility], [facility]    Osteoarthritis of right knee    Per [facility], [facility] Records [facility]    Paraplegia [facility])    Pyelonephritis, acute    Per [facility], [facility] Records [facility]    Status post total right knee replacement    Unspecified dementia without behavioral disturbance    Per [facility], [facility] Records [facility]    Urgency of urination    Per [facility], [facility]    Wheelchair dependence    Per [facility], [facility] Records [facility]     Past Surgical History:  Procedure Laterality Date   ABDOMINAL HYSTERECTOMY     CATARACT EXTRACTION     COLONOSCOPY N/A 12/10/2013   Margo LITTIE Haddock, MD ,Gi revealed redundant colon   TOTAL KNEE ARTHROPLASTY Right 03/30/2016   Procedure: TOTAL KNEE ARTHROPLASTY;  Surgeon: Taft FORBES Minerva, MD;  Location: [facility];  Service: Orthopedics;  Laterality: Right;    Social History   Socioeconomic History   Marital status: Divorced    Spouse name: Not on file   Number of children: Not on file   Years  of education: Not on file   Highest education level: Not on file  Occupational History   Occupation: retired   Tobacco Use   Smoking status: Former    Current packs/day: 0.25    Average packs/day: 0.3 packs/day for 3.0 years (0.8 ttl pk-yrs)    Types: Cigarettes   Smokeless tobacco: Never  Vaping Use   Vaping status: Never Used  Substance and Sexual Activity   Alcohol use: No   Drug use: No   Sexual activity: Not Currently    Birth control/protection: Post-menopausal  Other Topics Concern   Not on file  Social History Narrative   Long term resident of [facility]    Social Drivers of Health   Financial Resource Strain: Not on file  Food Insecurity: Not on file  Transportation Needs: Not on  file  Physical Activity: Inactive (02/17/2020)   Exercise Vital Sign    Days of Exercise per Week: 0 days    Minutes of Exercise per Session: 0 min  Stress: Not on file  Social Connections: Not on file  Intimate Partner Violence: Not At Risk (02/17/2020)   Humiliation, Afraid, Rape, and Kick questionnaire    Fear of Current or Ex-Partner: No    Emotionally Abused: No    Physically Abused: No    Sexually Abused: No   Family History  Problem Relation Age of Onset   Cancer Mother    Arthritis Mother    Hypotension Father    Cancer Sister    Hypotension Sister    Colon cancer Neg Hx       VITAL SIGNS BP 137/67   Pulse 81   Temp (!) 97.2 F (36.2 C)   Resp 20   Ht 5' (1.524 m)   Wt 205 lb (93 kg)   SpO2 94%   BMI 40.04 kg/m   Outpatient Encounter Medications as of 04/29/2024  Medication Sig   acetaminophen  (TYLENOL ) 650 MG CR tablet Take 650 mg by mouth 2 (two) times daily.   Balsam [place]-Castor Oil (VENELEX) OINT Apply 1 Application topically daily in the afternoon. Apply to open areas on her buttock and upper legs (Patient not taking: Reported on 04/18/2024)   camphor-menthol  (SARNA) lotion Apply 1 Application topically 2 (two) times daily as needed for itching.   guaifenesin (ROBITUSSIN) 100 MG/5ML syrup Take 300 mg by mouth every 6 (six) hours as needed for cough.   ipratropium-albuterol  (DUONEB) 0.5-2.5 (3) MG/3ML SOLN Take 3 mLs by nebulization every 6 (six) hours.   latanoprost  (XALATAN ) 0.005 % ophthalmic solution Place 1 drop into both eyes at bedtime. wait 5 minutes between multiple eye drops   loratadine  (CLARITIN ) 10 MG tablet Take 10 mg by mouth daily. (Patient not taking: Reported on 04/18/2024)   NON FORMULARY Diet: NAS, (Patient not taking: Reported on 04/18/2024)   Nutritional Supplements (ENSURE PO) Take by mouth 2 (two) times daily.   No facility-administered encounter medications on file as of 04/29/2024.     SIGNIFICANT DIAGNOSTIC EXAMS  05-31-23:  vitamin D  35.45 07-19-23: glucose 105; bun 18; creat 0.72; k+ 3.8; na++ 136; ca 8.6; gfr >60  09-03-23: protein 6.5 albumin 2.9 10-05-23: tsh 1.824; free t3: 2.6 free t4: 0.88 10-12-23: wbc 3.9; hgb 11.8; hct 38.7; mcv 82.3 plt 320 glucose 120; bun 13; creat 0.57; k+ 3.5; na++ 136; ca 8.8; gfr >60; protein 7.2 albumin 3.2; free t3: 2.8 10-26-23: CEA 2.7 02-18-24: hgb A1c 6.0  TODAY  04-23-24: wbc 5.3; hgb 14.0; hct 44.0; mcv 85.9  plt clump; iron  30 tibc 253      Review of Systems  Constitutional:  Negative for malaise/fatigue.  Respiratory:  Negative for cough and shortness of breath.   Cardiovascular:  Negative for chest pain, palpitations and leg swelling.  Gastrointestinal:  Negative for abdominal pain, constipation and heartburn.  Musculoskeletal:  Negative for back pain, joint pain and myalgias.  Skin: Negative.   Neurological:  Negative for dizziness.  Psychiatric/Behavioral:  The patient is not nervous/anxious.     Physical Exam Constitutional:      General: She is not in acute distress.    Appearance: She is well-developed. She is morbidly obese. She is not diaphoretic.  Neck:     Thyroid : No thyromegaly.  Cardiovascular:     Rate and Rhythm: Normal rate and regular rhythm.     Heart sounds: Normal heart sounds.  Pulmonary:     Effort: Pulmonary effort is normal. No respiratory distress.     Breath sounds: Normal breath sounds.  Abdominal:     General: Bowel sounds are normal. There is no distension.     Palpations: Abdomen is soft.     Tenderness: There is no abdominal tenderness.  Musculoskeletal:     Cervical back: Neck supple.     Right lower leg: Edema present.     Left lower leg: Edema present.  Lymphadenopathy:     Cervical: No cervical adenopathy.  Skin:    General: Skin is warm and dry.  Neurological:     Mental Status: She is alert. Mental status is at baseline.  Psychiatric:        Mood and Affect: Mood normal.         ASSESSMENT/ PLAN:   TODAY;    GERD without esophagitis: is off PPI  2. Vitamin D  deficiency: level 35.45 will monitor  3. Urinary  urgency: is totally incontinent of bladder; is off myrbetriq    PREVIOUS    4. Vascular dementia without behavioral disturbance: weight is 205 pounds is currently stable.   5. Aortic atherosclerosis (ct 09-03-19)   6. Hypokalemia: k+ 3.8; will monitor  7. Increased intraocular present bilateral: will continue xalatan  to both eyes  8. Weight loss/failure to thrive in adult: her current weight is 205 pounds; her weight this past month is stable.    9. Primary osteoarthritis right knee: will continue tylenol  650 mg twice daily   10. Unspecified protein calorie malnutrition: albumin 3.2 will monitor  11. Allergic rhinitis: will continue claritin  10 mg daily   12. Hypertension associated with type 2 diabetes mellitus: b/p 137/67: is on asa 81 mg daily  13. Type 2 diabetes mellitus with vascular complications: hgb A1c is 6.0   14. Chronic anemia: hgb 14.0; is off iron      Barnie Seip NP [facility]  call [phone]

## 2024-04-29 NOTE — Progress Notes (Unsigned)
 [facility] [address], [place] [address]   CLINIC:  Medical Oncology/Hematology  PCP:  Landy Barnie RAMAN, NP [address] [place] [address] [phone]   REASON FOR VISIT:  Follow-up for elevated D-dimer and iron  deficiency anemia  PRIOR THERAPY: Eliquis 5 mg twice daily  CURRENT THERAPY: Oral iron  supplementation + intermittent IV iron   INTERVAL HISTORY:   Anna Hanna 89 y.o. female returns for routine follow-up of her elevated D-dimer and iron  deficiency anemia.   She was last seen by Pleasant Barefoot PA-C on 04/25/2023.  At today's visit, she reports feeling well.   She has not had any recent infections, surgeries, hospitalizations since her last visit.   She does not have any current signs of DVT or PE.    She denies any unilateral leg swelling, pain, and erythema.    She has not noticed any new shortness of breath, dyspnea on exertion, chest pain, hemoptysis, and palpitations.  She reports that she has intermittent soft, black-colored bowel movements, which she thinks may be from taking her iron  pill.  She has some good energy.   She denies any pica, restless legs, headaches, chest pain, dyspnea on exertion, lightheadedness, or syncope.  She has 100% energy and 75% appetite.  She endorses that she is maintaining a stable weight.  ASSESSMENT & PLAN:  1.  Iron  deficiency anemia - Hemoccult positive x2 in December 2022 - Patient saw GI (Dr. Cindie) on 08/11/2021, but declined further work-up with EGD/colonoscopy. - CT abdomen/pelvis (01/04/2022): No evidence of GI tract malignancy - Labs from 08/26/2021 showed Hgb 9.9/MCV 77.4.  Ferritin 10 with iron  saturation 5%.  Mild thrombocytosis with platelets 410. - Additional labs (08/26/2021): Normal SPEP.  Normal B12, methylmalonic acid, folate, copper . - She reports black stool, but thinks that this may be from her iron  tablet. - She is taking daily iron  polysaccharide tablet. - Most recent IV  iron  Venofer  300 mg x 3 from 01/20/2022 through 02/17/2022 - Most recent labs (04/23/2024): Hgb 14.0, iron  saturation 12%, ferritin 300 - Previously discussed with patient that she likely had some chronic GI blood loss from unknown source and that while her CT scan did not show any evidence of malignancy, this does not mean that it is completely excluded.  She declined further work-up and wanted to treat supportively with iron  repletion as needed. - Blood and iron  levels have improved after discontinuation of Eliquis - PLAN: Iron  deficiency anemia has resolved.  She has not required IV iron  since 2023. - Recommend discharge to PCP.  Continue iron  tablet 3 days weekly (MWF).   2.  Thrombocytosis, RESOLVED - Intermittent mild thrombocytosis noted since at least 2018 - Resolved after iron  supplementation  3.  Elevated D-dimer - From extensive review of the chart, it appears that she had COVID infection with elevated D-dimer in June 2022. - Eliquis was started at low-dose of 2.5 mg twice daily around 01/19/2021.  At some point she was switched to Lovenox  and then switched back to Eliquis at 5 mg twice daily which she remains on at this time. - She had a CT chest angiogram on 02/18/2021 on 05/05/2021 both of which were negative for pulmonary embolism.  Lower extremity Doppler on 02/24/2021 was negative for DVT. - Denies any recent infections or hospitalizations.  Denies any recent surgeries. - Denies any B symptoms. - Declined EGD/colonoscopy. - 2D echocardiogram (09/20/2021): LVEF 55 to 60%, grade 1 diastolic dysfunction.  No major valvulopathy is noted. -  CT abdomen pelvis was obtained on 01/04/2022 to rule out occult malignancy in the setting of elevated D-dimer, Hemoccult positive stools, and iron  deficiency anemia - results revealed left breast calcification suspected to be benign (stable compared to CT abdomen/pelvis from February 2021), no findings suspicious for malignancy. - Hematology work-up  (08/26/2021) ruled out chronic DIC Fibrinogen  minimally elevated at 477  Elevated factor VIII at 233% Normal factor V. Elevated INR 1.4/PT 17.1, APTT normal at 32. - Most recent D-dimer (11/22/2021): Markedly elevated at > 20 - Discussed with Dr. Rogers, who recommends against further D-dimer testing at this time, unless patient is exhibiting symptoms concerning for possible blood clots. - Discussed with patient that there is wide differential for elevated D-dimer, including clotting, inflammation, infection, and malignancy. - PLAN: No indication for anticoagulation or ongoing testing of D-dimer.  We will discontinue Eliquis and recommend that D-dimer only be checked if there is clinical suspicion for DVT or PE.   4.  Left breast lesion - Patient has history of oil cyst in the left breast, excisional biopsy performed on 09/17/2002, but I am unable to review pathology reports.  Presumably benign, since patient denies any history of breast cancer.  - Last mammogram on record was 07/01/2015, which is BI-RADS Category 1 negative, no mammographic evidence of malignancy.  No mention of left breast calcification made and report, but it is clearly visible on my personal review of images, nonconcerning in appearance (round, uniform macrocalcification).   - Lesion noted on CT abdomen/pelvis with contrast on 09/03/2019 as a peripherally calcified 2.2 cm lesion in the left breast/low left axilla partially included - CT abdomen/pelvis (01/04/2022): Peripherally calcified nodular density in left breast is rounded measuring 18 mm and appears unchanged - Suspected to be benign calcification of left breast, given appearance and stability dating back to mammogram in 2016 and prior.   - PLAN: No further work-up or follow-up needed   5.  Social/family history: - She reports that she has been at [facility] for the last couple of years after she broke her right leg and cannot walk.  She is wheelchair dependent.  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  Quit smoking 2 years ago.  Smoked half pack per day for 15 years. - No family history of malignancies.   PLAN SUMMARY: >> Discharge to PCP     REVIEW OF SYSTEMS:   Review of Systems  Constitutional:  Negative for fatigue.  Respiratory:  Negative for cough and shortness of breath.   Cardiovascular:  Negative for leg swelling.  Gastrointestinal:  Negative for diarrhea, nausea and vomiting.  Musculoskeletal:  Positive for gait problem (Uses wheelchair). Negative for arthralgias.  Neurological:  Positive for gait problem (Uses wheelchair). Negative for dizziness and headaches.  Psychiatric/Behavioral:  Positive for depression and sleep disturbance. The patient is nervous/anxious.     PHYSICAL EXAM:  ECOG PERFORMANCE STATUS: 3 - Symptomatic, >50% confined to bed  Vitals:   04/30/24 1306  BP: 117/77  Pulse: 95  Resp: 18  Temp: 97.8 F (36.6 C)  SpO2: 92%   Physical Exam Vitals reviewed.  Constitutional:      Appearance: Normal appearance. She is obese.  Cardiovascular:     Rate and Rhythm: Normal rate and regular rhythm.  Pulmonary:     Effort: Pulmonary effort is normal.     Breath sounds: Normal breath sounds.  Abdominal:     General: Bowel sounds are normal.  Lymphadenopathy:     Cervical:  No cervical adenopathy.  Skin:    General: Skin is warm and dry.  Neurological:     Mental Status: She is alert and oriented to person, place, and time.    PAST MEDICAL/SURGICAL HISTORY:  Past Medical History:  Diagnosis Date   Abnormal posture    Per [facility], [facility] Records [facility]    Anemia    Cognitive communication deficit    Per [facility], [facility] Records [facility]    Contracture of left knee    Per [facility], [facility] Records [facility]    Difficulty walking    Per [facility], [facility] Records [facility]     Extended spectrum beta lactamase (ESBL) resistance    Per [facility], [facility] Records [facility]    Gastroesophageal reflux disease    Per [facility], [facility] Records [facility]    Glaucoma    Gram negative sepsis (HCC)    Per [facility], [facility] Records [facility]    Hypercholesteremia    Hypertension    Hypokalemia    Infection, bacterial    Per [facility], [facility] Records [facility]    Iron  deficiency anemia due to chronic blood loss 11/23/2021   Muscle weakness (generalized)    Per [facility], [facility] Records [facility]    Need for assistance with personal care    Per [facility], [facility] Records [facility]    OAB (overactive bladder)    Per [facility], [facility] Records [facility]    Ocular hypertension, bilateral    Per [facility], [facility] Records [facility]    Osteoarthritis of right knee    Per [facility], [facility] Records [facility]    Paraplegia [facility])    Pyelonephritis, acute    Per [facility], [facility] Records [facility]    Status post total right knee replacement    Unspecified dementia without behavioral disturbance    Per [facility], [facility] Records [facility]    Urgency of urination    Per [facility], [facility] Records [facility]    Wheelchair dependence    Per [place], [facility] Records [facility]    Past Surgical History:  Procedure Laterality Date   ABDOMINAL HYSTERECTOMY     CATARACT EXTRACTION     COLONOSCOPY N/A 12/10/2013   Margo LITTIE Haddock, MD ,Gi revealed redundant colon   TOTAL KNEE ARTHROPLASTY Right 03/30/2016   Procedure: TOTAL KNEE ARTHROPLASTY;  Surgeon: Taft FORBES Minerva, MD;  Location: [facility];  Service: Orthopedics;  Laterality:  Right;    SOCIAL HISTORY:  Social History   Socioeconomic History   Marital status: Divorced    Spouse name: Not on file   Number of children: Not on file   Years of education: Not on file   Highest education level: Not on file  Occupational History   Occupation: retired   Tobacco Use   Smoking status: Former    Current packs/day: 0.25    Average packs/day: 0.3 packs/day for 3.0 years (0.8 ttl pk-yrs)    Types: Cigarettes   Smokeless tobacco: Never  Vaping Use   Vaping status: Never Used  Substance and Sexual Activity   Alcohol use: No   Drug use: No   Sexual activity: Not Currently    Birth control/protection: Post-menopausal  Other Topics Concern   Not on file  Social History Narrative   Long term resident of [facility]    Social Drivers of Health   Financial Resource Strain: Not on file  Food Insecurity: Not on file  Transportation Needs: Not on file  Physical Activity: Inactive (02/17/2020)   Exercise Vital Sign    Days of Exercise per Week: 0 days    Minutes of Exercise per Session: 0 min  Stress: Not on file  Social Connections: Not on file  Intimate Partner Violence: Not At Risk (02/17/2020)   Humiliation, Afraid, Rape, and Kick questionnaire    Fear of Current or Ex-Partner: No    Emotionally Abused: No    Physically Abused: No    Sexually Abused: No    FAMILY HISTORY:  Family History  Problem Relation Age of Onset   Cancer Mother    Arthritis Mother    Hypotension Father    Cancer Sister    Hypotension Sister    Colon cancer Neg Hx     CURRENT MEDICATIONS:  Outpatient Encounter Medications as of 04/30/2024  Medication Sig   acetaminophen  (TYLENOL ) 650 MG CR tablet Take 650 mg by mouth 2 (two) times daily.   ipratropium-albuterol  (DUONEB) 0.5-2.5 (3) MG/3ML SOLN Take 3 mLs by nebulization every 6 (six) hours.   latanoprost  (XALATAN ) 0.005 % ophthalmic solution Place 1 drop into both eyes at bedtime. wait 5 minutes between multiple eye drops    Nutritional Supplements (ENSURE PO) Take by mouth 2 (two) times daily.   Balsam [place]-Castor Oil (VENELEX) OINT Apply 1 Application topically daily in the afternoon. Apply to open areas on her buttock and upper legs (Patient not taking: Reported on 04/30/2024)   camphor-menthol  (SARNA) lotion Apply 1 Application topically 2 (two) times daily as needed for itching. (Patient not taking: Reported on 04/30/2024)   guaifenesin (ROBITUSSIN) 100 MG/5ML syrup Take 300 mg by mouth every 6 (six) hours as needed for cough. (Patient not taking: Reported on 04/30/2024)   loratadine  (CLARITIN ) 10 MG tablet Take 10 mg by mouth daily. (Patient not taking: Reported on 04/30/2024)   NON FORMULARY Diet: NAS, (Patient not taking: Reported on 04/30/2024)   No facility-administered encounter medications on file as of 04/30/2024.    ALLERGIES:  No Known Allergies  LABORATORY DATA:  I have reviewed the labs as listed.  CBC    Component Value Date/Time   WBC 5.3 04/23/2024 1322   RBC 5.12 (H) 04/23/2024 1322   HGB 14.0 04/23/2024 1322   HCT 44.0 04/23/2024 1322   HCT 26.4 (L) 04/03/2016 0550   PLT PLATELET CLUMPS NOTED ON SMEAR, UNABLE TO ESTIMATE 04/23/2024 1322   MCV 85.9 04/23/2024 1322   MCH 27.3 04/23/2024 1322   MCHC 31.8 04/23/2024 1322   RDW 15.1 04/23/2024 1322   LYMPHSABS 1.5 04/23/2024 1322   MONOABS 0.5 04/23/2024 1322   EOSABS 0.2 04/23/2024 1322   BASOSABS 0.0 04/23/2024 1322      Latest Ref Rng & Units 10/12/2023    2:17 PM 09/03/2023    8:00 AM 07/19/2023   10:30 PM  CMP  Glucose 70 - 99 mg/dL 879   894   BUN 8 - 23 mg/dL 13   18   Creatinine 9.55 - 1.00 mg/dL 9.42   9.27   Sodium 864 - 145 mmol/L 136   136   Potassium 3.5 - 5.1 mmol/L 3.5   3.8   Chloride 98 - 111 mmol/L 99   104  CO2 22 - 32 mmol/L 28   24   Calcium 8.9 - 10.3 mg/dL 8.8   8.6   Total Protein 6.5 - 8.1 g/dL 7.2  6.5    Total Bilirubin 0.0 - 1.2 mg/dL 0.3     Alkaline Phos 38 - 126 U/L 75     AST 15 - 41 U/L 33      ALT 0 - 44 U/L 25       DIAGNOSTIC IMAGING:  I have independently reviewed the relevant imaging and discussed with the patient.   WRAP UP:  All questions were answered. The patient knows to call the clinic with any problems, questions or concerns.  Time spent on visit: I spent 15 minutes counseling the patient face to face. The total time spent in the appointment was 22 minutes and more than 50% was on counseling.  Pleasant CHRISTELLA Barefoot, PA-C  04/30/24 1:27 PM

## 2024-04-30 ENCOUNTER — Encounter: Payer: Self-pay | Admitting: Physician Assistant

## 2024-04-30 ENCOUNTER — Inpatient Hospital Stay: Payer: Medicare HMO | Admitting: Physician Assistant

## 2024-04-30 VITALS — BP 117/77 | HR 95 | Temp 97.8°F | Resp 18

## 2024-04-30 DIAGNOSIS — K921 Melena: Secondary | ICD-10-CM | POA: Diagnosis not present

## 2024-04-30 DIAGNOSIS — R791 Abnormal coagulation profile: Secondary | ICD-10-CM | POA: Diagnosis not present

## 2024-04-30 DIAGNOSIS — D500 Iron deficiency anemia secondary to blood loss (chronic): Secondary | ICD-10-CM | POA: Diagnosis not present

## 2024-04-30 DIAGNOSIS — Z993 Dependence on wheelchair: Secondary | ICD-10-CM | POA: Diagnosis not present

## 2024-04-30 DIAGNOSIS — D509 Iron deficiency anemia, unspecified: Secondary | ICD-10-CM | POA: Diagnosis not present

## 2024-04-30 DIAGNOSIS — R7989 Other specified abnormal findings of blood chemistry: Secondary | ICD-10-CM | POA: Diagnosis not present

## 2024-04-30 DIAGNOSIS — Z87891 Personal history of nicotine dependence: Secondary | ICD-10-CM | POA: Diagnosis not present

## 2024-04-30 DIAGNOSIS — H401134 Primary open-angle glaucoma, bilateral, indeterminate stage: Secondary | ICD-10-CM | POA: Diagnosis not present

## 2024-04-30 DIAGNOSIS — Z809 Family history of malignant neoplasm, unspecified: Secondary | ICD-10-CM | POA: Diagnosis not present

## 2024-04-30 NOTE — Patient Instructions (Signed)
 [facility] at [facility] **VISIT SUMMARY & IMPORTANT INSTRUCTIONS **   You were seen today by Pleasant Barefoot PA-C for your history of iron  deficiency anemia.   Your most recent labs show normal iron  and normal hemoglobin/red blood cells. You have not needed IV iron  for over 2 years. We will discharge you from Cancer Center at this time, with the following recommendations. Continue taking iron  tablet 3 days weekly (Monday, Wednesday, Friday). Continue follow-up with your primary care provider, who can check blood and iron  levels at least once yearly. You can be referred back to us  in the future should you have need for additional IV iron  down the road.  ** Thank you for trusting me with your healthcare!  I strive to provide all of my patients with quality care at each visit.  If you receive a survey for this visit, I would be so grateful to you for taking the time to provide feedback.  Thank you in advance!  ~ Norabelle Kondo                                        Dr. Mickiel Davonna Pleasant Barefoot, PA-C       Delon Hope, NP   - - - - - - - - - - - - - - - - - -    Thank you for choosing [facility] at [facility] to provide your oncology and hematology care.  To afford each patient quality time with our provider, please arrive at least 15 minutes before your scheduled appointment time.   If you have a lab appointment with the Cancer Center please come in thru the Main Entrance and check in at the main information desk.  You need to re-schedule your appointment should you arrive 10 or more minutes late.  We strive to give you quality time with our providers, and arriving late affects you and other patients whose appointments are after yours.  Also, if you no show three or more times for appointments you may be dismissed from the clinic at the providers discretion.     Again, thank you for choosing [facility].  Our hope is  that these requests will decrease the amount of time that you wait before being seen by our physicians.       _____________________________________________________________  Should you have questions after your visit to [facility], please contact our office at [phone] and follow the prompts.  Our office hours are 8:00 a.m. and 4:30 p.m. Monday - Friday.  Please note that voicemails left after 4:00 p.m. may not be returned until the following business day.  We are closed weekends and major holidays.  You do have access to a nurse 24-7, just call the main number to the clinic [phone] and do not press any options, hold on the line and a nurse will answer the phone.    For prescription refill requests, have your pharmacy contact our office and allow 72 hours.

## 2024-05-27 ENCOUNTER — Encounter: Payer: Self-pay | Admitting: Adult Health

## 2024-05-27 ENCOUNTER — Non-Acute Institutional Stay (SKILLED_NURSING_FACILITY): Payer: Self-pay | Admitting: Adult Health

## 2024-05-27 DIAGNOSIS — I700 Atherosclerosis of aorta: Secondary | ICD-10-CM

## 2024-05-27 DIAGNOSIS — F01C0 Vascular dementia, severe, without behavioral disturbance, psychotic disturbance, mood disturbance, and anxiety: Secondary | ICD-10-CM

## 2024-05-27 DIAGNOSIS — E876 Hypokalemia: Secondary | ICD-10-CM

## 2024-05-27 NOTE — Progress Notes (Signed)
 Location:  [facility]   Place of Service:   SNF   CODE STATUS: dnr  No Known Allergies  Chief Complaint  Patient presents with   Medical Management of Chronic Issues            Vascular dementia without behavioral disturbance:    Aortic atherosclerosis Hypokalemia    HPI:  She is a 89 y.o. long term resident of this facility being seen for the management of her chronic illnesses:Vascular dementia without behavioral disturbance:    Aortic atherosclerosis Hypokalemia. There are no reports of uncontrolled pain. She is sleeping more in the daytime. She takes very few medications on a routine basis.    Past Medical History:  Diagnosis Date   Abnormal posture    Per [facility] Electronic Medical Records System    Anemia    Cognitive communication deficit    Per [facility] Records [facility]    Contracture of left knee    Per [facility] Records [facility]    Difficulty walking    Per [facility] Records [facility]    Extended spectrum beta lactamase (ESBL) resistance    Per [facility] Records [facility]    Gastroesophageal reflux disease    Per [facility] Records [facility]    Glaucoma    Gram negative sepsis (HCC)    Per [facility] Records [facility]    Hypercholesteremia    Hypertension    Hypokalemia    Infection, bacterial    Per [facility] Records [facility]    Iron  deficiency anemia due to chronic blood loss 11/23/2021   Muscle weakness (generalized)    Per [facility] Records [facility]    Need for assistance with personal care    Per [facility] Records [facility]    OAB (overactive bladder)    Per [facility] Electronic  Medical Records System    Ocular hypertension, bilateral    Per [facility] Records [facility]    Osteoarthritis of right knee    Per [facility] Records [facility]    Paraplegia [facility])    Pyelonephritis, acute    Per [facility] Electronic Medical Records [facility]    Status post total right knee replacement    Unspecified dementia without behavioral disturbance    Per [facility] Records [facility]    Urgency of urination    Per [facility] Records [facility]    Wheelchair dependence    Per [place] Electronic [facility] Records System     Past Surgical History:  Procedure Laterality Date   ABDOMINAL HYSTERECTOMY     CATARACT EXTRACTION     COLONOSCOPY N/A 12/10/2013   Margo LITTIE Haddock, MD ,Gi revealed redundant colon   TOTAL KNEE ARTHROPLASTY Right 03/30/2016   Procedure: TOTAL KNEE ARTHROPLASTY;  Surgeon: Taft FORBES Minerva, MD;  Location: [facility];  Service: Orthopedics;  Laterality: Right;    Social History   Socioeconomic History   Marital status: Divorced    Spouse name: Not on file   Number of children: Not on file   Years of education: Not on file   Highest education level: Not on file  Occupational History  Occupation: retired   Tobacco Use   Smoking status: Former    Current packs/day: 0.25    Average packs/day: 0.3 packs/day for 3.0 years (0.8 ttl pk-yrs)    Types: Cigarettes   Smokeless tobacco: Never  Vaping Use   Vaping status: Never Used  Substance and Sexual Activity   Alcohol use: No   Drug use: No   Sexual activity: Not Currently    Birth control/protection: Post-menopausal  Other Topics Concern   Not on file  Social History Narrative   Long term resident of [facility]    Social Drivers of Health   Financial Resource Strain: Not on file  Food Insecurity: Not on file  Transportation  Needs: Not on file  Physical Activity: Inactive (02/17/2020)   Exercise Vital Sign    Days of Exercise per Week: 0 days    Minutes of Exercise per Session: 0 min  Stress: Not on file  Social Connections: Not on file  Intimate Partner Violence: Not At Risk (02/17/2020)   Humiliation, Afraid, Rape, and Kick questionnaire    Fear of Current or Ex-Partner: No    Emotionally Abused: No    Physically Abused: No    Sexually Abused: No   Family History  Problem Relation Age of Onset   Cancer Mother    Arthritis Mother    Hypotension Father    Cancer Sister    Hypotension Sister    Colon cancer Neg Hx       VITAL SIGNS BP 108/63   Pulse 76   Temp 97.6 F (36.4 C)   Resp 20   Ht 5' (1.524 m)   Wt 204 lb 9.6 oz (92.8 kg)   SpO2 96%   BMI 39.96 kg/m   Outpatient Encounter Medications as of 05/27/2024  Medication Sig   acetaminophen  (TYLENOL ) 650 MG CR tablet Take 650 mg by mouth 2 (two) times daily.   latanoprost  (XALATAN ) 0.005 % ophthalmic solution Place 1 drop into both eyes at bedtime. wait 5 minutes between multiple eye drops   Nutritional Supplements (ENSURE PO) Take by mouth 2 (two) times daily.   [DISCONTINUED] Balsam [place]-Castor Oil (VENELEX) OINT Apply 1 Application topically daily in the afternoon. Apply to open areas on her buttock and upper legs (Patient not taking: Reported on 04/30/2024)   [DISCONTINUED] camphor-menthol  (SARNA) lotion Apply 1 Application topically 2 (two) times daily as needed for itching. (Patient not taking: Reported on 04/30/2024)   [DISCONTINUED] guaifenesin (ROBITUSSIN) 100 MG/5ML syrup Take 300 mg by mouth every 6 (six) hours as needed for cough. (Patient not taking: Reported on 04/30/2024)   [DISCONTINUED] ipratropium-albuterol  (DUONEB) 0.5-2.5 (3) MG/3ML SOLN Take 3 mLs by nebulization every 6 (six) hours.   [DISCONTINUED] loratadine  (CLARITIN ) 10 MG tablet Take 10 mg by mouth daily. (Patient not taking: Reported on 04/30/2024)   [DISCONTINUED] NON  FORMULARY Diet: NAS, (Patient not taking: Reported on 04/30/2024)   No facility-administered encounter medications on file as of 05/27/2024.     SIGNIFICANT DIAGNOSTIC EXAMS  05-31-23: vitamin D  35.45 07-19-23: glucose 105; bun 18; creat 0.72; k+ 3.8; na++ 136; ca 8.6; gfr >60  09-03-23: protein 6.5 albumin 2.9 10-05-23: tsh 1.824; free t3: 2.6 free t4: 0.88 10-12-23: wbc 3.9; hgb 11.8; hct 38.7; mcv 82.3 plt 320 glucose 120; bun 13; creat 0.57; k+ 3.5; na++ 136; ca 8.8; gfr >60; protein 7.2 albumin 3.2; free t3: 2.8 10-26-23: CEA 2.7 02-18-24: hgb A1c 6.0  TODAY  04-23-24: wbc 5.3; hgb 14.0; hct 44.0; mcv  85.9 plt clump; iron  30 tibc 253      Review of Systems  Constitutional:  Negative for malaise/fatigue.  Respiratory:  Negative for cough and shortness of breath.   Cardiovascular:  Negative for chest pain, palpitations and leg swelling.  Gastrointestinal:  Negative for abdominal pain, constipation and heartburn.  Musculoskeletal:  Negative for back pain, joint pain and myalgias.  Skin: Negative.   Neurological:  Negative for dizziness.  Psychiatric/Behavioral:  The patient is not nervous/anxious.    Physical Exam Constitutional:      General: She is not in acute distress.    Appearance: She is well-developed. She is morbidly obese. She is not diaphoretic.  Neck:     Thyroid : No thyromegaly.  Cardiovascular:     Rate and Rhythm: Normal rate and regular rhythm.     Heart sounds: Normal heart sounds.  Pulmonary:     Effort: Pulmonary effort is normal. No respiratory distress.     Breath sounds: Normal breath sounds.  Abdominal:     General: Bowel sounds are normal. There is no distension.     Palpations: Abdomen is soft.     Tenderness: There is no abdominal tenderness.  Musculoskeletal:     Cervical back: Neck supple.     Right lower leg: Edema present.     Left lower leg: Edema present.  Lymphadenopathy:     Cervical: No cervical adenopathy.  Skin:    General: Skin is  warm and dry.  Neurological:     Mental Status: She is alert. Mental status is at baseline.  Psychiatric:        Mood and Affect: Mood normal.         ASSESSMENT/ PLAN:   TODAY;   Vascular dementia without behavioral disturbance: weight is 204 pounds; weight is stable; she does sleep more during the daytime hours.   2. Aortic atherosclerosis (ct 09-03-19)  3. Hypokalemia k+ 3.8 will monitor  PREVIOUS    4. Increased intraocular present bilateral: will continue xalatan  to both eyes  5. Weight loss/failure to thrive in adult: her current weight is 204 pounds; her weight this past month is stable.    6. Primary osteoarthritis right knee: will continue tylenol  650 mg twice daily   7. Unspecified protein calorie malnutrition: albumin 3.2 will monitor  8. Allergic rhinitis: is currently off medications.   9. Hypertension associated with type 2 diabetes mellitus: b/p 108/63  10. Type 2 diabetes mellitus with vascular complications: hgb A1c is 6.0   11. Chronic anemia: hgb 14.0; is off iron    12. GERD without esophagitis: is off PPI  13. Vitamin D  deficiency: level 35.45 will monitor  14. Urinary  urgency: is totally incontinent of bladder; is off myrbetriq     Will check cmp; hgb A1c tsh      Barnie Seip NP [facility]   call [phone]

## 2024-05-29 ENCOUNTER — Other Ambulatory Visit (HOSPITAL_COMMUNITY)
Admission: RE | Admit: 2024-05-29 | Discharge: 2024-05-29 | Disposition: A | Discharge status: Home / Self Care | Source: Skilled Nursing Facility | Attending: Adult Health | Admitting: Adult Health

## 2024-05-29 DIAGNOSIS — E1122 Type 2 diabetes mellitus with diabetic chronic kidney disease: Secondary | ICD-10-CM | POA: Diagnosis not present

## 2024-05-29 LAB — COMPREHENSIVE METABOLIC PANEL WITH GFR
ALT: 9 U/L (ref 0–44)
AST: 15 U/L (ref 15–41)
Albumin: 3.6 g/dL (ref 3.5–5.0)
Alkaline Phosphatase: 83 U/L (ref 38–126)
Anion gap: 8 (ref 5–15)
BUN: 13 mg/dL (ref 8–23)
CO2: 29 mmol/L (ref 22–32)
Calcium: 8.7 mg/dL — ABNORMAL LOW (ref 8.9–10.3)
Chloride: 103 mmol/L (ref 98–111)
Creatinine, Ser: 0.45 mg/dL (ref 0.44–1.00)
GFR, Estimated: >60 mL/min (ref >60)
Glucose, Bld: 86 mg/dL (ref 70–99)
Potassium: 4.2 mmol/L (ref 3.5–5.1)
Sodium: 140 mmol/L (ref 135–145)
Total Bilirubin: 0.3 mg/dL (ref 0.0–1.2)
Total Protein: 6.9 g/dL (ref 6.5–8.1)

## 2024-05-29 LAB — HEMOGLOBIN A1C
Hgb A1c MFr Bld: 5.7 % — ABNORMAL HIGH (ref 4.8–5.6)
Mean Plasma Glucose: 116.89 mg/dL

## 2024-05-29 LAB — TSH: TSH: 1.24 u[IU]/mL (ref 0.350–4.500)

## 2024-06-24 ENCOUNTER — Non-Acute Institutional Stay (SKILLED_NURSING_FACILITY): Payer: Self-pay | Admitting: Internal Medicine

## 2024-06-24 ENCOUNTER — Encounter: Payer: Self-pay | Admitting: Internal Medicine

## 2024-06-24 DIAGNOSIS — E1151 Type 2 diabetes mellitus with diabetic peripheral angiopathy without gangrene: Secondary | ICD-10-CM | POA: Diagnosis not present

## 2024-06-24 DIAGNOSIS — F01C0 Vascular dementia, severe, without behavioral disturbance, psychotic disturbance, mood disturbance, and anxiety: Secondary | ICD-10-CM

## 2024-06-24 DIAGNOSIS — D649 Anemia, unspecified: Secondary | ICD-10-CM | POA: Diagnosis not present

## 2024-06-24 DIAGNOSIS — I10 Essential (primary) hypertension: Secondary | ICD-10-CM

## 2024-06-24 NOTE — Progress Notes (Unsigned)
 NURSING HOME LOCATION:  [facility]  ROOM NUMBER:  [id number]  CODE STATUS:  DNR  PCP: Landy Barnie RAMAN, NP   This is a nursing facility follow up visit  of chronic medical diagnoses to document compliance with Regulation 483.30 (c) in The Long Term Care Survey Manual Phase 2 which mandates caregiver visit ( visits can alternate among physician, PA or NP as per statutes) within 10 days of 30 days / 60 days/ 90 days post admission to SNF date  .  Interim medical record and care since last SNF visit was updated with review of diagnostic studies and change in clinical status since last visit were documented.  HPI: She is a permanent resident of the facility with medical diagnoses of vitamin D  deficiency; vascular dementia; protein/caloric malnutrition; glaucoma; OAB; history of iron  deficiency anemia; GERD; diabetes with peripheral vascular complications; aortic atherosclerosis; and essential hypertension.  Labs are current as of 05/29/2024; mild hypocalcemia is present which is essentially stable with a value of 8.7.  Hypoalbuminemia has been corrected with a value of 3.6.  Total protein was normal at 6.9.  A1c was in the prediabetic range of 5.7% and TSH was therapeutic at 1.240.  CBC on 10/1 revealed resolution of anemia.  Review of systems: Dementia invalidated responses. Date given as June 25, 2019.  She was unable to give me her room number but pointed it out as we neared.  She also could not tell me her roommate's name but pointed to her as we passed her in the hall. She complained of R ankle pain from hitting it on the bed she has no other specific physical complaints.  Her main concern is I am ready to go home.  She states that she must be there because upgrades going to be made to my trailer which I bought this year.  She states that she lives with her cousin.  She states that she must leave here because it gets on my nerves.  Constitutional: No fever,  significant weight change, fatigue  Eyes: No redness, discharge, pain, vision change ENT/mouth: No nasal congestion,  purulent discharge, earache, change in hearing, sore throat  Cardiovascular: No chest pain, palpitations, paroxysmal nocturnal dyspnea, claudication, edema  Respiratory: No cough, sputum production, hemoptysis, DOE, significant snoring, apnea   Gastrointestinal: No heartburn, dysphagia, abdominal pain, nausea /vomiting, rectal bleeding, melena, change in bowels Genitourinary: No dysuria, hematuria, pyuria, incontinence, nocturia Dermatologic: No rash, pruritus, change in appearance of skin Neurologic: No dizziness, headache, syncope, seizures, numbness, tingling Psychiatric: No significant depression, insomnia, anorexia Endocrine: No change in hair/skin/nails, excessive thirst, excessive hunger, excessive urination  Hematologic/lymphatic: No significant bruising, lymphadenopathy, abnormal bleeding Allergy/immunology: No itchy/watery eyes, significant sneezing, urticaria, angioedema  Physical exam:  Pertinent or positive findings: Eyebrows are decreased in density.  She exhibits a hyponasal speech pattern.  She is edentulous.  There is irregular hyperpigmentation of the hard palate.  Slight tachycardia is present.  She has minor rales at the bases.  Abdomen is protuberant.  Pedal pulses are not palpable.  She has trace-1/2+ at the sock line.  When I attempted to check the pedal pulses she pulled her ankles away as if they were tender.  She has flexion contractures of the fingers.  She also has a flexion contracture with decreased extension with fusiform enlargement of the right knee.  General appearance: Adequately nourished; no acute distress, increased work of breathing is present.   Lymphatic: No lymphadenopathy about the head, neck, axilla.  Eyes: No conjunctival inflammation or lid edema is present. There is no scleral icterus. Ears:  External ear exam shows no significant  lesions or deformities.   Nose:  External nasal examination shows no deformity or inflammation. Nasal mucosa are pink and moist without lesions, exudates Neck:  No thyromegaly, masses, tenderness noted.    Heart:  No gallop, murmur, click, rub .  Lungs:  without wheezes, rhonchi,  rubs. Abdomen: Bowel sounds are normal. Abdomen is soft and nontender with no organomegaly, hernias, masses. GU: Deferred  Extremities:  No cyanosis, clubbing  Neurologic exam :Balance, Rhomberg, finger to nose testing could not be completed due to clinical state Skin: Warm & dry w/o tenting. No significant lesions or rash.  See summary under each active problem in the Problem List with associated updated therapeutic plan :  Benign essential HTN Blood pressure is actually soft without antihypertensive medications.  Continue to monitor.  Chronic anemia On 04/23/2024 CBC was normal indicating resolution of the anemia.   Monitor for any bleeding dyscrasias.  DM (diabetes mellitus), type 2 with peripheral vascular complications (HCC) Despite obesity , on 11/6 A1c was in the prediabetic range at 5.7% without medications.  No change indicated.  Continue to monitor.  Vascular dementia [facility]) She continues to confabulate; no behavioral issues reported.  Continue to monitor.

## 2024-06-24 NOTE — Assessment & Plan Note (Addendum)
 On 04/23/2024 CBC was normal indicating resolution of the anemia.   Monitor for any bleeding dyscrasias.

## 2024-06-24 NOTE — Assessment & Plan Note (Signed)
 Blood pressure is actually soft without antihypertensive medications.  Continue to monitor.

## 2024-06-24 NOTE — Assessment & Plan Note (Signed)
 On 11/6 A1c was in the prediabetic range at 5.7% without medications.  No change indicated.  Continue to monitor.

## 2024-06-24 NOTE — Assessment & Plan Note (Signed)
 She continues to confabulate; no behavioral issues reported.  Continue to monitor.

## 2024-06-24 NOTE — Patient Instructions (Signed)
 See assessment and plan under each diagnosis in the problem list and acutely for this visit

## 2024-07-11 ENCOUNTER — Encounter: Payer: Self-pay | Admitting: Adult Health

## 2024-07-11 ENCOUNTER — Non-Acute Institutional Stay (SKILLED_NURSING_FACILITY): Payer: Self-pay | Admitting: Adult Health

## 2024-07-11 DIAGNOSIS — E1151 Type 2 diabetes mellitus with diabetic peripheral angiopathy without gangrene: Secondary | ICD-10-CM | POA: Diagnosis not present

## 2024-07-11 DIAGNOSIS — I152 Hypertension secondary to endocrine disorders: Secondary | ICD-10-CM | POA: Diagnosis not present

## 2024-07-11 DIAGNOSIS — E1159 Type 2 diabetes mellitus with other circulatory complications: Secondary | ICD-10-CM | POA: Diagnosis not present

## 2024-07-11 DIAGNOSIS — G8220 Paraplegia, unspecified: Secondary | ICD-10-CM

## 2024-07-11 NOTE — Progress Notes (Signed)
 " Location:  [facility] Room Number: 85 [id number] Place of Service:  SNF (31)   CODE STATUS: DNR/DNH  Allergies[1]  Chief Complaint  Patient presents with   Care Plan Meeting    HPI:  We have come together for her care plan meeting. BIMS 7/15 mood 6/60: restless and some depression. She has delusions wanting money to buy her little boy a coat. Uses wheelchair. She requires dependent assist with her adl care. She is incontinent of bladder and bowel. Dietary: regular diet; feeds self; appetite 1-100%. Weight is 204 pounds. Therapy has completed. Activities: does participate. She will continue to be followed for her chronic illnesses including: DM (diabetes mellitus) type 2 with peripheral vascular complications  Hypertension with type 2 diabetes mellitus   Paraplegia unspecified  Past Medical History:  Diagnosis Date   Abnormal posture    Per [facility], [facility] Records [facility]    Anemia    Cognitive communication deficit    Per [facility], [facility]    Contracture of left knee    Per [facility], [facility]    Difficulty walking    Per [facility], [facility]    Extended spectrum beta lactamase (ESBL) resistance    Per [facility], [facility]    Gastroesophageal reflux disease    Per [facility], [facility]    Glaucoma    Gram negative sepsis (HCC)    Per [facility], [facility] Records [facility]    Hypercholesteremia    Hypertension    Hypokalemia    Infection, bacterial    Per [facility], [facility] Records [facility]    Iron  deficiency anemia due to chronic blood loss 11/23/2021   Muscle weakness (generalized)    Per [facility], [facility]    Need  for assistance with personal care    Per [facility], [facility]    OAB (overactive bladder)    Per [facility], [facility]    Ocular hypertension, bilateral    Per [facility], [facility]    Osteoarthritis of right knee    Per [facility], [facility]    Paraplegia [facility])    Pyelonephritis, acute    Per [facility], [facility] Records [facility]    Status post total right knee replacement    Unspecified dementia without behavioral disturbance    Per [facility], [facility] Records [facility]    Urgency of urination    Per [facility], [facility]    Wheelchair dependence    Per [facility], [facility] Records [facility]     Past Surgical History:  Procedure Laterality Date   ABDOMINAL HYSTERECTOMY     CATARACT EXTRACTION     COLONOSCOPY N/A 12/10/2013   Margo LITTIE Haddock, MD ,Gi revealed redundant colon   TOTAL KNEE ARTHROPLASTY Right 03/30/2016   Procedure: TOTAL KNEE ARTHROPLASTY;  Surgeon: Taft FORBES Minerva, MD;  Location: [facility];  Service: Orthopedics;  Laterality: Right;    Social History   Socioeconomic History   Marital status: Divorced    Spouse name: Not on file   Number of children: Not on file   Years  of education: Not on file   Highest education level: Not on file  Occupational History   Occupation: retired   Tobacco Use   Smoking status: Former    Current packs/day: 0.25    Average packs/day: 0.3 packs/day for 3.0 years (0.8 ttl pk-yrs)    Types: Cigarettes   Smokeless tobacco: Never  Vaping Use   Vaping status: Never Used  Substance and Sexual Activity   Alcohol use: No   Drug use: No   Sexual activity: Not Currently    Birth control/protection: Post-menopausal  Other Topics Concern   Not on  file  Social History Narrative   Long term resident of [facility]    Social Drivers of Health   Tobacco Use: Medium Risk (07/11/2024)   Patient History    Smoking Tobacco Use: Former    Smokeless Tobacco Use: Never    Passive Exposure: Not on Actuary Strain: Not on file  Food Insecurity: Not on file  Transportation Needs: Not on file  Physical Activity: Not on file  Stress: Not on file  Social Connections: Not on file  Intimate Partner Violence: Not on file  Depression (PHQ2-9): Low Risk (04/30/2024)   Depression (PHQ2-9)    PHQ-2 Score: 1  Alcohol Screen: Not on file  Housing: Not on file  Utilities: Not on file  Health Literacy: Not on file   Family History  Problem Relation Age of Onset   Cancer Mother    Arthritis Mother    Hypotension Father    Cancer Sister    Hypotension Sister    Colon cancer Neg Hx       VITAL SIGNS BP 127/76   Pulse (!) 111   Temp 98.3 F (36.8 C)   Resp (!) 21   Ht 5' (1.524 m)   Wt 206 lb 3.2 oz (93.5 kg)   SpO2 94%   BMI 40.27 kg/m   Outpatient Encounter Medications as of 07/11/2024  Medication Sig   acetaminophen  (TYLENOL ) 650 MG CR tablet Take 650 mg by mouth 2 (two) times daily.   latanoprost  (XALATAN ) 0.005 % ophthalmic solution Place 1 drop into both eyes at bedtime. wait 5 minutes between multiple eye drops   zinc  oxide 20 % ointment Apply 1 Application topically. Special Instructions: Apply to upper thighs & inguinal area qshift & prn incontinence episodes for skin irritation and prevention. Every Shift Day, Evening, Night   Nutritional Supplements (ENSURE PO) Take by mouth 2 (two) times daily. (Patient not taking: Reported on 07/11/2024)   No facility-administered encounter medications on file as of 07/11/2024.     SIGNIFICANT DIAGNOSTIC EXAMS  07-19-23: glucose 105; bun 18; creat 0.72; k+ 3.8; na++ 136; ca 8.6; gfr >60  09-03-23: protein 6.5 albumin 2.9 10-05-23: tsh 1.824; free t3: 2.6 free t4:  0.88 10-12-23: wbc 3.9; hgb 11.8; hct 38.7; mcv 82.3 plt 320 glucose 120; bun 13; creat 0.57; k+ 3.5; na++ 136; ca 8.8; gfr >60; protein 7.2 albumin 3.2; free t3: 2.8 10-26-23: CEA 2.7 02-18-24: hgb A1c 6.0  TODAY  04-23-24: wbc 5.3; hgb 14.0; hct 44.0; mcv 85.9 plt clump; iron  30 tibc 253      Review of Systems  Constitutional:  Negative for malaise/fatigue.  Respiratory:  Negative for cough and shortness of breath.   Cardiovascular:  Negative for chest pain, palpitations and leg swelling.  Gastrointestinal:  Negative for abdominal pain, constipation and heartburn.  Musculoskeletal:  Negative for back pain, joint pain and myalgias.  Skin: Negative.  Neurological:  Negative for dizziness.  Psychiatric/Behavioral:  The patient is not nervous/anxious.     Physical Exam Constitutional:      General: She is not in acute distress.    Appearance: She is well-developed. She is morbidly obese. She is not diaphoretic.  Neck:     Thyroid : No thyromegaly.  Cardiovascular:     Rate and Rhythm: Normal rate and regular rhythm.     Heart sounds: Normal heart sounds.  Pulmonary:     Effort: Pulmonary effort is normal. No respiratory distress.     Breath sounds: Normal breath sounds.  Abdominal:     General: Bowel sounds are normal. There is no distension.     Palpations: Abdomen is soft.     Tenderness: There is no abdominal tenderness.  Musculoskeletal:     Cervical back: Neck supple.     Right lower leg: Edema present.     Left lower leg: Edema present.  Lymphadenopathy:     Cervical: No cervical adenopathy.  Skin:    General: Skin is warm and dry.  Neurological:     Mental Status: She is alert. Mental status is at baseline.  Psychiatric:        Mood and Affect: Mood normal.      ASSESSMENT/ PLAN:  TODAY  DM (diabetes mellitus) type 2 with peripheral vascular complications Hypertension with type 2 diabetes mellitus Paraplegia unspecified  Will continue current plan of  care Will continue current medications Will continue to monitor her status.   Time spent with patient: 40 minutes: medications; dietary; weight    Barnie Seip NP [facility]   call [phone]      [1] No Known Allergies  "

## 2024-07-21 ENCOUNTER — Other Ambulatory Visit (HOSPITAL_COMMUNITY)
Admission: RE | Admit: 2024-07-21 | Discharge: 2024-07-21 | Disposition: A | Discharge status: Home / Self Care | Source: Skilled Nursing Facility | Attending: Adult Health | Admitting: Adult Health

## 2024-07-21 DIAGNOSIS — J99 Respiratory disorders in diseases classified elsewhere: Secondary | ICD-10-CM | POA: Insufficient documentation

## 2024-07-21 DIAGNOSIS — J111 Influenza due to unidentified influenza virus with other respiratory manifestations: Secondary | ICD-10-CM | POA: Diagnosis present

## 2024-07-21 LAB — RESP PANEL BY RT-PCR (RSV, FLU A&B, COVID)  RVPGX2
Influenza A by PCR: NEGATIVE
Influenza B by PCR: NEGATIVE
Resp Syncytial Virus by PCR: NEGATIVE
SARS Coronavirus 2 by RT PCR: NEGATIVE

## 2024-07-28 ENCOUNTER — Encounter: Payer: Self-pay | Admitting: Adult Health

## 2024-07-28 DIAGNOSIS — M1711 Unilateral primary osteoarthritis, right knee: Secondary | ICD-10-CM | POA: Diagnosis not present

## 2024-07-28 DIAGNOSIS — R634 Abnormal weight loss: Secondary | ICD-10-CM | POA: Diagnosis not present

## 2024-07-28 DIAGNOSIS — H409 Unspecified glaucoma: Secondary | ICD-10-CM | POA: Diagnosis not present

## 2024-07-28 NOTE — Progress Notes (Signed)
 " Location:  [facility] Room Number: 105 Place of Service:  SNF (31)   CODE STATUS: dnr   Allergies[1]  Chief Complaint  Patient presents with   Medical Management of Chronic Issues            Increased intraocular pressure bilateral: Weight loss/ failure to thrive in adult: Primary osteoarthritis right knee    HPI:  She is a 89 y.o. long term resident of this facility being seen for the management of her chronic illnesses: Increased intraocular pressure bilateral: Weight loss/ failure to thrive in adult: Primary osteoarthritis right knee. There are no reports of uncontrolled pain. Her weight remains stable in the low 200's. She continues to get out of bed daily. She does tend to take naps during the daytime.    Past Medical History:  Diagnosis Date   Abnormal posture    Per [facility], [facility] Records [facility]    Anemia    Cognitive communication deficit    Per [facility], [facility]    Contracture of left knee    Per [facility], [facility]    Difficulty walking    Per [facility], [facility]    Extended spectrum beta lactamase (ESBL) resistance    Per [facility], [facility]    Gastroesophageal reflux disease    Per [facility], [facility]    Glaucoma    Gram negative sepsis (HCC)    Per [facility], [facility] Records [facility]    Hypercholesteremia    Hypertension    Hypokalemia    Infection, bacterial    Per [facility], [facility]    Iron  deficiency anemia due to chronic blood loss 11/23/2021   Muscle weakness (generalized)    Per [facility], [facility] Records [facility]    Need for assistance with personal care    Per [facility], [facility]    OAB (overactive bladder)    Per [facility], [facility]    Ocular hypertension, bilateral    Per [facility], [facility]    Osteoarthritis of right knee    Per [facility], [facility]    Paraplegia [facility])    Pyelonephritis, acute    Per [facility], [facility] Records [facility]    Status post total right knee replacement    Unspecified dementia without behavioral disturbance    Per [facility], [facility]    Urgency of urination    Per [facility], [facility]    Wheelchair dependence    Per [place], [facility] Records [facility]     Past Surgical History:  Procedure Laterality Date   ABDOMINAL HYSTERECTOMY     CATARACT EXTRACTION     COLONOSCOPY N/A 12/10/2013   Margo LITTIE Haddock, MD ,Gi revealed redundant colon   TOTAL KNEE ARTHROPLASTY Right 03/30/2016   Procedure: TOTAL KNEE ARTHROPLASTY;  Surgeon: Taft FORBES Minerva, MD;  Location: [facility];  Service: Orthopedics;  Laterality: Right;    Social History   Socioeconomic History   Marital status: Divorced    Spouse name: Not on file   Number of children: Not on file  Years of education: Not on file   Highest education level: Not on file  Occupational History   Occupation: retired   Tobacco Use   Smoking status: Former    Current packs/day: 0.25    Average packs/day: 0.3 packs/day for 3.0 years (0.8 ttl pk-yrs)    Types: Cigarettes   Smokeless tobacco: Never  Vaping Use   Vaping status: Never Used  Substance and Sexual Activity   Alcohol use: No   Drug use: No   Sexual activity: Not Currently    Birth control/protection: Post-menopausal  Other Topics Concern   Not on file  Social History Narrative   Long term resident of  [facility]    Social Drivers of Health   Tobacco Use: Medium Risk (07/28/2024)   Patient History    Smoking Tobacco Use: Former    Smokeless Tobacco Use: Never    Passive Exposure: Not on Actuary Strain: Not on file  Food Insecurity: Not on file  Transportation Needs: Not on file  Physical Activity: Not on file  Stress: Not on file  Social Connections: Not on file  Intimate Partner Violence: Not on file  Depression (PHQ2-9): Low Risk (04/30/2024)   Depression (PHQ2-9)    PHQ-2 Score: 1  Alcohol Screen: Not on file  Housing: Not on file  Utilities: Not on file  Health Literacy: Not on file   Family History  Problem Relation Age of Onset   Cancer Mother    Arthritis Mother    Hypotension Father    Cancer Sister    Hypotension Sister    Colon cancer Neg Hx       VITAL SIGNS BP 126/67   Pulse 80   Temp (!) 97.3 F (36.3 C)   Resp 18   Ht 5' (1.524 m)   Wt 207 lb 8 oz (94.1 kg)   SpO2 98%   BMI 40.52 kg/m   Outpatient Encounter Medications as of 07/28/2024  Medication Sig   acetaminophen  (TYLENOL ) 650 MG CR tablet Take 650 mg by mouth 2 (two) times daily.   latanoprost  (XALATAN ) 0.005 % ophthalmic solution Place 1 drop into both eyes at bedtime. wait 5 minutes between multiple eye drops   Nutritional Supplements (ENSURE PO) Take by mouth 2 (two) times daily. (Patient not taking: Reported on 07/11/2024)   zinc  oxide 20 % ointment Apply 1 Application topically. Special Instructions: Apply to upper thighs & inguinal area qshift & prn incontinence episodes for skin irritation and prevention. Every Shift Day, Evening, Night   No facility-administered encounter medications on file as of 07/28/2024.     SIGNIFICANT DIAGNOSTIC EXAMS  LABS   09-03-23: protein 6.5 albumin 2.9 10-05-23: tsh 1.824; free t3: 2.6 free t4: 0.88 10-12-23: wbc 3.9; hgb 11.8; hct 38.7; mcv 82.3 plt 320 glucose 120; bun 13; creat 0.57; k+ 3.5; na++ 136; ca 8.8; gfr >60; protein 7.2 albumin  3.2; free t3: 2.8 10-26-23: CEA 2.7 02-18-24: hgb A1c 6.0 04-23-24: wbc 5.3; hgb 14.0; hct 44.0; mcv 85.9 plt clump; iron  30 tibc 253   TODAY  05-29-24: glucose 86; bun 13; creat 0.45; k+ 4.2; na++ 140; ca 8.7; gfr >60; protein 6.9 albumin 3.6 hgb A1c 5.7 tsh 1.240      Review of Systems  Constitutional:  Negative for malaise/fatigue.  Respiratory:  Negative for cough and shortness of breath.   Cardiovascular:  Negative for chest pain, palpitations and leg swelling.  Gastrointestinal:  Negative for abdominal pain, constipation and heartburn.  Musculoskeletal:  Negative for back pain, joint pain and myalgias.  Skin: Negative.   Neurological:  Negative for dizziness.  Psychiatric/Behavioral:  The patient is not nervous/anxious.    Physical Exam Constitutional:      General: She is not in acute distress.    Appearance: She is well-developed. She is morbidly obese. She is not diaphoretic.  Neck:     Thyroid : No thyromegaly.  Cardiovascular:     Rate and Rhythm: Normal rate and regular rhythm.     Heart sounds: Normal heart sounds.  Pulmonary:     Effort: Pulmonary effort is normal. No respiratory distress.     Breath sounds: Normal breath sounds.  Abdominal:     General: Bowel sounds are normal. There is no distension.     Palpations: Abdomen is soft.     Tenderness: There is no abdominal tenderness.  Musculoskeletal:     Cervical back: Neck supple.     Right lower leg: Edema present.     Left lower leg: Edema present.  Lymphadenopathy:     Cervical: No cervical adenopathy.  Skin:    General: Skin is warm and dry.  Neurological:     Mental Status: She is alert. Mental status is at baseline.  Psychiatric:        Mood and Affect: Mood normal.           ASSESSMENT/ PLAN:   TODAY;   Increased intraocular pressure bilateral: will continue xalatan  to both eyes nightly   2. Weight loss/ failure to thrive in adult: her current weight is stable at 207 pounds and has been  stable over the past 2 months.   3. Primary osteoarthritis right knee: will continue tylenol  650 mg twice daily   PREVIOUS    4. Unspecified protein calorie malnutrition: albumin 3.6 will monitor  5. Allergic rhinitis: is currently off medications.   6. Hypertension associated with type 2 diabetes mellitus: b/p 126/67  7. Type 2 diabetes mellitus with vascular complications: hgb A1c is 5.7   8. Chronic anemia: hgb 14.0; is off iron    9. GERD without esophagitis: is off PPI  10. Vitamin D  deficiency: level 35.45 will monitor  11. Urinary  urgency: is totally incontinent of bladder; is off myrbetriq    12. Vascular dementia without behavioral disturbance: weight is 207 pounds; weight is stable; she does sleep more during the daytime hours.   13. Aortic atherosclerosis (ct 09-03-19)  14. Hypokalemia k+ 4.2 will monitor     Barnie Seip NP [facility]   call [phone]     [1] No Known Allergies  "

## 2024-08-28 ENCOUNTER — Non-Acute Institutional Stay (SKILLED_NURSING_FACILITY): Payer: Self-pay | Admitting: Adult Health

## 2024-08-28 DIAGNOSIS — J301 Allergic rhinitis due to pollen: Secondary | ICD-10-CM

## 2024-08-28 DIAGNOSIS — I152 Hypertension secondary to endocrine disorders: Secondary | ICD-10-CM

## 2024-08-28 DIAGNOSIS — E1159 Type 2 diabetes mellitus with other circulatory complications: Secondary | ICD-10-CM

## 2024-08-28 DIAGNOSIS — E441 Mild protein-calorie malnutrition: Secondary | ICD-10-CM

## 2024-08-28 NOTE — Progress Notes (Signed)
 " Location:  [facility] Room Number: 105 Place of Service:  SNF (31)   CODE STATUS: dnr   Allergies[1]  Chief Complaint  Patient presents with   Medical Management of Chronic Issues         Unspecified protein calorie malnutrition: Allergic rhinitis: Hypertension associated with type 2 diabetes mellitus:     HPI:  She is a 89 y.o. long term resident of this facility being seen for the management of her chronic illnesses:Unspecified protein calorie malnutrition: Allergic rhinitis: Hypertension associated with type 2 diabetes mellitus. There are no reports of uncontrolled pain. Her blood pressure readings are well controlled. Her weight remains stable.    Past Medical History:  Diagnosis Date   Abnormal posture    Per [facility], [facility] Electronic [facility] Records [facility]    Anemia    Cognitive communication deficit    Per [facility], [facility]    Contracture of left knee    Per [facility], [facility]    Difficulty walking    Per [facility], [facility]    Extended spectrum beta lactamase (ESBL) resistance    Per [facility], [facility]    Gastroesophageal reflux disease    Per [facility], [facility] Records [facility]    Glaucoma    Gram negative sepsis (HCC)    Per [facility], [facility] Records [facility]    Hypercholesteremia    Hypertension    Hypokalemia    Infection, bacterial    Per [facility], [facility] Records [facility]    Iron  deficiency anemia due to chronic blood loss 11/23/2021   Muscle weakness (generalized)    Per [facility], [facility] Records [facility]    Need for assistance with personal care    Per [facility], [facility]    OAB  (overactive bladder)    Per [facility], [facility]    Ocular hypertension, bilateral    Per [facility], [facility]    Osteoarthritis of right knee    Per [facility], [facility]    Paraplegia [facility])    Pyelonephritis, acute    Per [facility], [facility] Records [facility]    Status post total right knee replacement    Unspecified dementia without behavioral disturbance    Per [facility], [facility]    Urgency of urination    Per [facility], [facility]    Wheelchair dependence    Per [place], [facility]     Past Surgical History:  Procedure Laterality Date   ABDOMINAL HYSTERECTOMY     CATARACT EXTRACTION     COLONOSCOPY N/A 12/10/2013   Margo LITTIE Haddock, MD ,Gi revealed redundant colon   TOTAL KNEE ARTHROPLASTY Right 03/30/2016   Procedure: TOTAL KNEE ARTHROPLASTY;  Surgeon: Taft FORBES Minerva, MD;  Location: [facility];  Service: Orthopedics;  Laterality: Right;    Social History   Socioeconomic History   Marital status: Divorced    Spouse name: Not on file   Number of children: Not on file   Years of education: Not on file   Highest education level: Not on file  Occupational History  Occupation: retired   Tobacco Use   Smoking status: Former    Current packs/day: 0.25    Average packs/day: 0.3 packs/day for 3.0 years (0.8 ttl pk-yrs)    Types: Cigarettes   Smokeless tobacco: Never  Vaping Use   Vaping status: Never Used  Substance and Sexual Activity   Alcohol use: No   Drug use: No   Sexual activity: Not Currently    Birth control/protection: Post-menopausal  Other Topics Concern   Not on file  Social History Narrative   Long term resident of [facility]    Social Drivers of Health   Tobacco Use: Medium  Risk (07/28/2024)   Patient History    Smoking Tobacco Use: Former    Smokeless Tobacco Use: Never    Passive Exposure: Not on Actuary Strain: Not on file  Food Insecurity: Not on file  Transportation Needs: Not on file  Physical Activity: Not on file  Stress: Not on file  Social Connections: Not on file  Intimate Partner Violence: Not on file  Depression (PHQ2-9): Low Risk (04/30/2024)   Depression (PHQ2-9)    PHQ-2 Score: 1  Alcohol Screen: Not on file  Housing: Not on file  Utilities: Not on file  Health Literacy: Not on file   Family History  Problem Relation Age of Onset   Cancer Mother    Arthritis Mother    Hypotension Father    Cancer Sister    Hypotension Sister    Colon cancer Neg Hx       VITAL SIGNS BP 127/62   Pulse 88   Temp 98.6 F (37 C)   Resp 20   Ht 5' (1.524 m)   Wt 207 lb 3.2 oz (94 kg)   SpO2 94%   BMI 40.47 kg/m   Outpatient Encounter Medications as of 08/28/2024  Medication Sig   acetaminophen  (TYLENOL ) 650 MG CR tablet Take 650 mg by mouth 2 (two) times daily.   latanoprost  (XALATAN ) 0.005 % ophthalmic solution Place 1 drop into both eyes at bedtime. wait 5 minutes between multiple eye drops   Nutritional Supplements (ENSURE PO) Take by mouth 2 (two) times daily. (Patient not taking: Reported on 07/11/2024)   zinc  oxide 20 % ointment Apply 1 Application topically. Special Instructions: Apply to upper thighs & inguinal area qshift & prn incontinence episodes for skin irritation and prevention. Every Shift Day, Evening, Night   No facility-administered encounter medications on file as of 08/28/2024.     SIGNIFICANT DIAGNOSTIC EXAMS   LABS   09-03-23: protein 6.5 albumin 2.9 10-05-23: tsh 1.824; free t3: 2.6 free t4: 0.88 10-12-23: wbc 3.9; hgb 11.8; hct 38.7; mcv 82.3 plt 320 glucose 120; bun 13; creat 0.57; k+ 3.5; na++ 136; ca 8.8; gfr >60; protein 7.2 albumin 3.2; free t3: 2.8 10-26-23: CEA 2.7 02-18-24: hgb A1c  6.0 04-23-24: wbc 5.3; hgb 14.0; hct 44.0; mcv 85.9 plt clump; iron  30 tibc 253   TODAY  05-29-24: glucose 86; bun 13; creat 0.45; k+ 4.2; na++ 140; ca 8.7; gfr >60; protein 6.9 albumin 3.6 hgb A1c 5.7 tsh 1.240     Review of Systems  Constitutional:  Negative for malaise/fatigue.  Respiratory:  Negative for cough and shortness of breath.   Cardiovascular:  Negative for chest pain, palpitations and leg swelling.  Gastrointestinal:  Negative for abdominal pain, constipation and heartburn.  Musculoskeletal:  Negative for back pain, joint pain and myalgias.  Skin: Negative.   Neurological:  Negative for dizziness.  Psychiatric/Behavioral:  The patient is not nervous/anxious.     Physical Exam Constitutional:      General: She is not in acute distress.    Appearance: She is well-developed. She is morbidly obese. She is not diaphoretic.  Neck:     Thyroid : No thyromegaly.  Cardiovascular:     Rate and Rhythm: Normal rate and regular rhythm.     Heart sounds: Normal heart sounds.  Pulmonary:     Effort: Pulmonary effort is normal. No respiratory distress.     Breath sounds: Normal breath sounds.  Abdominal:     General: Bowel sounds are normal. There is no distension.     Palpations: Abdomen is soft.     Tenderness: There is no abdominal tenderness.  Musculoskeletal:        General: Normal range of motion.     Cervical back: Neck supple.     Right lower leg: Edema present.     Left lower leg: Edema present.  Lymphadenopathy:     Cervical: No cervical adenopathy.  Skin:    General: Skin is warm and dry.  Neurological:     Mental Status: She is alert. Mental status is at baseline.  Psychiatric:        Mood and Affect: Mood normal.           ASSESSMENT/ PLAN:   TODAY;   Unspecified protein calorie malnutrition: albumin 3.6; will monitor  2. Allergic rhinitis: is presently off medications  3. Hypertension associated with type 2 diabetes mellitus: b/p 127/62  PREVIOUS     4. Type 2 diabetes mellitus with vascular complications: hgb A1c is 5.7   5. Chronic anemia: hgb 14.0; is off iron    6. GERD without esophagitis: is off PPI  7. Vitamin D  deficiency: level 35.45 will monitor  8. Urinary  urgency: is totally incontinent of bladder; is off myrbetriq    9. Vascular dementia without behavioral disturbance: weight is 207 pounds; weight is stable; she does sleep more during the daytime hours.   10. Aortic atherosclerosis (ct 09-03-19)  11. Hypokalemia k+ 4.2 will monitor  12. Increased intraocular pressure bilateral: will continue xalatan  to both eyes nightly   13. Weight loss/ failure to thrive in adult: her current weight is stable at 207 pounds and has been stable over the past 2 months.   14. Primary osteoarthritis right knee: will continue tylenol  650 mg twice daily      Barnie Seip NP [facility]  call [phone]     [1] No Known Allergies  "
